# Patient Record
Sex: Male | Born: 1965 | Race: White | Hispanic: No | Marital: Married | Smoking: Never smoker
Health system: Southern US, Community
[De-identification: ages and names within clinical notes are randomized; demographics above are authoritative.]

## PROBLEM LIST (undated history)

## (undated) DIAGNOSIS — I2510 Atherosclerotic heart disease of native coronary artery without angina pectoris: Secondary | ICD-10-CM

## (undated) DIAGNOSIS — I4891 Unspecified atrial fibrillation: Secondary | ICD-10-CM

## (undated) DIAGNOSIS — I5021 Acute systolic (congestive) heart failure: Secondary | ICD-10-CM

---

## 1999-01-29 ENCOUNTER — Emergency Department (HOSPITAL_COMMUNITY): Admission: EM | Admit: 1999-01-29 | Discharge: 1999-01-29 | Payer: Self-pay | Admitting: Internal Medicine

## 2000-11-02 ENCOUNTER — Emergency Department (HOSPITAL_COMMUNITY): Admission: EM | Admit: 2000-11-02 | Discharge: 2000-11-02 | Payer: Self-pay | Admitting: Emergency Medicine

## 2018-10-01 ENCOUNTER — Inpatient Hospital Stay (HOSPITAL_COMMUNITY)
Admission: EM | Admit: 2018-10-01 | Discharge: 2018-11-05 | DRG: 286 | Disposition: A | Discharge status: Rehabilitation Facility | Payer: Medicaid Other | Attending: Internal Medicine | Admitting: Internal Medicine

## 2018-10-01 ENCOUNTER — Other Ambulatory Visit: Payer: Self-pay

## 2018-10-01 ENCOUNTER — Inpatient Hospital Stay (HOSPITAL_COMMUNITY): Payer: Medicaid Other

## 2018-10-01 ENCOUNTER — Emergency Department (HOSPITAL_COMMUNITY): Payer: Medicaid Other

## 2018-10-01 ENCOUNTER — Encounter (HOSPITAL_COMMUNITY): Payer: Self-pay | Admitting: Emergency Medicine

## 2018-10-01 DIAGNOSIS — K589 Irritable bowel syndrome without diarrhea: Secondary | ICD-10-CM | POA: Diagnosis present

## 2018-10-01 DIAGNOSIS — Z978 Presence of other specified devices: Secondary | ICD-10-CM

## 2018-10-01 DIAGNOSIS — R570 Cardiogenic shock: Secondary | ICD-10-CM | POA: Diagnosis not present

## 2018-10-01 DIAGNOSIS — K920 Hematemesis: Secondary | ICD-10-CM | POA: Diagnosis not present

## 2018-10-01 DIAGNOSIS — K567 Ileus, unspecified: Secondary | ICD-10-CM | POA: Diagnosis not present

## 2018-10-01 DIAGNOSIS — E43 Unspecified severe protein-calorie malnutrition: Secondary | ICD-10-CM | POA: Diagnosis not present

## 2018-10-01 DIAGNOSIS — K7200 Acute and subacute hepatic failure without coma: Secondary | ICD-10-CM | POA: Diagnosis not present

## 2018-10-01 DIAGNOSIS — K560 Paralytic ileus: Secondary | ICD-10-CM | POA: Diagnosis not present

## 2018-10-01 DIAGNOSIS — F17220 Nicotine dependence, chewing tobacco, uncomplicated: Secondary | ICD-10-CM | POA: Diagnosis present

## 2018-10-01 DIAGNOSIS — I272 Pulmonary hypertension, unspecified: Secondary | ICD-10-CM | POA: Diagnosis present

## 2018-10-01 DIAGNOSIS — K2971 Gastritis, unspecified, with bleeding: Secondary | ICD-10-CM

## 2018-10-01 DIAGNOSIS — Z6837 Body mass index (BMI) 37.0-37.9, adult: Secondary | ICD-10-CM

## 2018-10-01 DIAGNOSIS — D509 Iron deficiency anemia, unspecified: Secondary | ICD-10-CM

## 2018-10-01 DIAGNOSIS — I472 Ventricular tachycardia: Secondary | ICD-10-CM | POA: Diagnosis not present

## 2018-10-01 DIAGNOSIS — R109 Unspecified abdominal pain: Secondary | ICD-10-CM | POA: Diagnosis not present

## 2018-10-01 DIAGNOSIS — I459 Conduction disorder, unspecified: Secondary | ICD-10-CM | POA: Diagnosis present

## 2018-10-01 DIAGNOSIS — K319 Disease of stomach and duodenum, unspecified: Secondary | ICD-10-CM | POA: Diagnosis present

## 2018-10-01 DIAGNOSIS — K2210 Ulcer of esophagus without bleeding: Secondary | ICD-10-CM | POA: Diagnosis not present

## 2018-10-01 DIAGNOSIS — I255 Ischemic cardiomyopathy: Secondary | ICD-10-CM | POA: Diagnosis present

## 2018-10-01 DIAGNOSIS — K761 Chronic passive congestion of liver: Secondary | ICD-10-CM | POA: Diagnosis present

## 2018-10-01 DIAGNOSIS — J189 Pneumonia, unspecified organism: Secondary | ICD-10-CM

## 2018-10-01 DIAGNOSIS — D62 Acute posthemorrhagic anemia: Secondary | ICD-10-CM

## 2018-10-01 DIAGNOSIS — R7989 Other specified abnormal findings of blood chemistry: Secondary | ICD-10-CM

## 2018-10-01 DIAGNOSIS — Y838 Other surgical procedures as the cause of abnormal reaction of the patient, or of later complication, without mention of misadventure at the time of the procedure: Secondary | ICD-10-CM | POA: Diagnosis not present

## 2018-10-01 DIAGNOSIS — R918 Other nonspecific abnormal finding of lung field: Secondary | ICD-10-CM | POA: Diagnosis not present

## 2018-10-01 DIAGNOSIS — K208 Other esophagitis without bleeding: Secondary | ICD-10-CM

## 2018-10-01 DIAGNOSIS — R933 Abnormal findings on diagnostic imaging of other parts of digestive tract: Secondary | ICD-10-CM | POA: Diagnosis not present

## 2018-10-01 DIAGNOSIS — J90 Pleural effusion, not elsewhere classified: Secondary | ICD-10-CM

## 2018-10-01 DIAGNOSIS — R1909 Other intra-abdominal and pelvic swelling, mass and lump: Secondary | ICD-10-CM | POA: Diagnosis not present

## 2018-10-01 DIAGNOSIS — R945 Abnormal results of liver function studies: Secondary | ICD-10-CM

## 2018-10-01 DIAGNOSIS — Z931 Gastrostomy status: Secondary | ICD-10-CM | POA: Diagnosis not present

## 2018-10-01 DIAGNOSIS — R0602 Shortness of breath: Secondary | ICD-10-CM | POA: Diagnosis present

## 2018-10-01 DIAGNOSIS — T502X5A Adverse effect of carbonic-anhydrase inhibitors, benzothiadiazides and other diuretics, initial encounter: Secondary | ICD-10-CM | POA: Diagnosis not present

## 2018-10-01 DIAGNOSIS — I2510 Atherosclerotic heart disease of native coronary artery without angina pectoris: Secondary | ICD-10-CM | POA: Diagnosis present

## 2018-10-01 DIAGNOSIS — E876 Hypokalemia: Secondary | ICD-10-CM

## 2018-10-01 DIAGNOSIS — I5043 Acute on chronic combined systolic (congestive) and diastolic (congestive) heart failure: Principal | ICD-10-CM | POA: Diagnosis present

## 2018-10-01 DIAGNOSIS — R5381 Other malaise: Secondary | ICD-10-CM | POA: Diagnosis not present

## 2018-10-01 DIAGNOSIS — Z09 Encounter for follow-up examination after completed treatment for conditions other than malignant neoplasm: Secondary | ICD-10-CM

## 2018-10-01 DIAGNOSIS — R601 Generalized edema: Secondary | ICD-10-CM | POA: Diagnosis present

## 2018-10-01 DIAGNOSIS — S7011XA Contusion of right thigh, initial encounter: Secondary | ICD-10-CM | POA: Diagnosis not present

## 2018-10-01 DIAGNOSIS — Z23 Encounter for immunization: Secondary | ICD-10-CM

## 2018-10-01 DIAGNOSIS — Z833 Family history of diabetes mellitus: Secondary | ICD-10-CM

## 2018-10-01 DIAGNOSIS — R0682 Tachypnea, not elsewhere classified: Secondary | ICD-10-CM

## 2018-10-01 DIAGNOSIS — I97630 Postprocedural hematoma of a circulatory system organ or structure following a cardiac catheterization: Secondary | ICD-10-CM | POA: Diagnosis not present

## 2018-10-01 DIAGNOSIS — R140 Abdominal distension (gaseous): Secondary | ICD-10-CM | POA: Diagnosis not present

## 2018-10-01 DIAGNOSIS — K8020 Calculus of gallbladder without cholecystitis without obstruction: Secondary | ICD-10-CM | POA: Diagnosis not present

## 2018-10-01 DIAGNOSIS — I471 Supraventricular tachycardia: Secondary | ICD-10-CM | POA: Diagnosis not present

## 2018-10-01 DIAGNOSIS — K3189 Other diseases of stomach and duodenum: Secondary | ICD-10-CM | POA: Diagnosis not present

## 2018-10-01 DIAGNOSIS — I080 Rheumatic disorders of both mitral and aortic valves: Secondary | ICD-10-CM | POA: Diagnosis present

## 2018-10-01 DIAGNOSIS — Z811 Family history of alcohol abuse and dependence: Secondary | ICD-10-CM

## 2018-10-01 DIAGNOSIS — N170 Acute kidney failure with tubular necrosis: Secondary | ICD-10-CM | POA: Diagnosis not present

## 2018-10-01 DIAGNOSIS — J9601 Acute respiratory failure with hypoxia: Secondary | ICD-10-CM | POA: Diagnosis not present

## 2018-10-01 DIAGNOSIS — I5021 Acute systolic (congestive) heart failure: Secondary | ICD-10-CM

## 2018-10-01 DIAGNOSIS — K209 Esophagitis, unspecified: Secondary | ICD-10-CM | POA: Diagnosis not present

## 2018-10-01 DIAGNOSIS — R1110 Vomiting, unspecified: Secondary | ICD-10-CM | POA: Diagnosis not present

## 2018-10-01 DIAGNOSIS — K599 Functional intestinal disorder, unspecified: Secondary | ICD-10-CM | POA: Diagnosis not present

## 2018-10-01 DIAGNOSIS — I951 Orthostatic hypotension: Secondary | ICD-10-CM

## 2018-10-01 DIAGNOSIS — Z4659 Encounter for fitting and adjustment of other gastrointestinal appliance and device: Secondary | ICD-10-CM

## 2018-10-01 DIAGNOSIS — L8990 Pressure ulcer of unspecified site, unspecified stage: Secondary | ICD-10-CM

## 2018-10-01 DIAGNOSIS — L89892 Pressure ulcer of other site, stage 2: Secondary | ICD-10-CM | POA: Diagnosis not present

## 2018-10-01 DIAGNOSIS — Z813 Family history of other psychoactive substance abuse and dependence: Secondary | ICD-10-CM

## 2018-10-01 DIAGNOSIS — N179 Acute kidney failure, unspecified: Secondary | ICD-10-CM

## 2018-10-01 DIAGNOSIS — I4891 Unspecified atrial fibrillation: Secondary | ICD-10-CM

## 2018-10-01 DIAGNOSIS — R000 Tachycardia, unspecified: Secondary | ICD-10-CM

## 2018-10-01 DIAGNOSIS — K56609 Unspecified intestinal obstruction, unspecified as to partial versus complete obstruction: Secondary | ICD-10-CM

## 2018-10-01 DIAGNOSIS — Z720 Tobacco use: Secondary | ICD-10-CM

## 2018-10-01 HISTORY — DX: Morbid (severe) obesity due to excess calories: E66.01

## 2018-10-01 LAB — CBC WITH DIFFERENTIAL/PLATELET
Abs Immature Granulocytes: 0.17 10*3/uL — ABNORMAL HIGH (ref 0.00–0.07)
Basophils Absolute: 0 10*3/uL (ref 0.0–0.1)
Basophils Relative: 0 %
Eosinophils Absolute: 0 10*3/uL (ref 0.0–0.5)
Eosinophils Relative: 0 %
HCT: 49.3 % (ref 39.0–52.0)
Hemoglobin: 15.1 g/dL (ref 13.0–17.0)
IMMATURE GRANULOCYTES: 1 %
Lymphocytes Relative: 7 %
Lymphs Abs: 1 10*3/uL (ref 0.7–4.0)
MCH: 29.2 pg (ref 26.0–34.0)
MCHC: 30.6 g/dL (ref 30.0–36.0)
MCV: 95.4 fL (ref 80.0–100.0)
MONOS PCT: 7 %
Monocytes Absolute: 0.9 10*3/uL (ref 0.1–1.0)
Neutro Abs: 11.4 10*3/uL — ABNORMAL HIGH (ref 1.7–7.7)
Neutrophils Relative %: 85 %
Platelets: 203 10*3/uL (ref 150–400)
RBC: 5.17 MIL/uL (ref 4.22–5.81)
RDW: 17.4 % — ABNORMAL HIGH (ref 11.5–15.5)
WBC: 13.5 10*3/uL — ABNORMAL HIGH (ref 4.0–10.5)
nRBC: 0.2 % (ref 0.0–0.2)

## 2018-10-01 LAB — URINALYSIS, ROUTINE W REFLEX MICROSCOPIC
Bilirubin Urine: NEGATIVE
Glucose, UA: NEGATIVE mg/dL
Ketones, ur: NEGATIVE mg/dL
Leukocytes,Ua: NEGATIVE
NITRITE: NEGATIVE
Protein, ur: NEGATIVE mg/dL
SPECIFIC GRAVITY, URINE: 1.023 (ref 1.005–1.030)
pH: 5 (ref 5.0–8.0)

## 2018-10-01 LAB — COMPREHENSIVE METABOLIC PANEL WITH GFR
ALT: 268 U/L — ABNORMAL HIGH (ref 0–44)
AST: 110 U/L — ABNORMAL HIGH (ref 15–41)
Albumin: 3.3 g/dL — ABNORMAL LOW (ref 3.5–5.0)
Alkaline Phosphatase: 153 U/L — ABNORMAL HIGH (ref 38–126)
Anion gap: 12 (ref 5–15)
BUN: 53 mg/dL — ABNORMAL HIGH (ref 6–20)
CO2: 19 mmol/L — ABNORMAL LOW (ref 22–32)
Calcium: 8.6 mg/dL — ABNORMAL LOW (ref 8.9–10.3)
Chloride: 105 mmol/L (ref 98–111)
Creatinine, Ser: 1.44 mg/dL — ABNORMAL HIGH (ref 0.61–1.24)
GFR calc Af Amer: >60 mL/min
GFR calc non Af Amer: 55 mL/min — ABNORMAL LOW
Glucose, Bld: 136 mg/dL — ABNORMAL HIGH (ref 70–99)
Potassium: 4.7 mmol/L (ref 3.5–5.1)
Sodium: 136 mmol/L (ref 135–145)
Total Bilirubin: 6.2 mg/dL — ABNORMAL HIGH (ref 0.3–1.2)
Total Protein: 6.4 g/dL — ABNORMAL LOW (ref 6.5–8.1)

## 2018-10-01 LAB — BRAIN NATRIURETIC PEPTIDE
B NATRIURETIC PEPTIDE 5: 2333.1 pg/mL — AB (ref 0.0–100.0)
B Natriuretic Peptide: 2769.2 pg/mL — ABNORMAL HIGH (ref 0.0–100.0)

## 2018-10-01 LAB — PROTIME-INR
INR: 1.4 — ABNORMAL HIGH (ref 0.8–1.2)
Prothrombin Time: 17.2 s — ABNORMAL HIGH (ref 11.4–15.2)

## 2018-10-01 LAB — TSH: TSH: 4.716 u[IU]/mL — ABNORMAL HIGH (ref 0.350–4.500)

## 2018-10-01 LAB — AMMONIA: Ammonia: 15 umol/L (ref 9–35)

## 2018-10-01 LAB — LIPASE, BLOOD: Lipase: 46 U/L (ref 11–51)

## 2018-10-01 MED ORDER — SODIUM CHLORIDE 0.9 % IV SOLN
1.0000 g | Freq: Once | INTRAVENOUS | Status: AC
  Administered 2018-10-01: 1 g via INTRAVENOUS
  Filled 2018-10-01: qty 10

## 2018-10-01 MED ORDER — TRAMADOL HCL 50 MG PO TABS
25.0000 mg | ORAL_TABLET | Freq: Four times a day (QID) | ORAL | Status: DC | PRN
  Administered 2018-10-01 – 2018-10-14 (×2): 25 mg via ORAL
  Filled 2018-10-01 (×2): qty 1

## 2018-10-01 MED ORDER — AZITHROMYCIN 250 MG PO TABS
500.0000 mg | ORAL_TABLET | Freq: Once | ORAL | Status: AC
  Administered 2018-10-01: 500 mg via ORAL
  Filled 2018-10-01: qty 2

## 2018-10-01 MED ORDER — FUROSEMIDE 10 MG/ML IJ SOLN
40.0000 mg | Freq: Once | INTRAMUSCULAR | Status: AC
  Administered 2018-10-01: 40 mg via INTRAVENOUS
  Filled 2018-10-01: qty 4

## 2018-10-01 MED ORDER — ORAL CARE MOUTH RINSE
15.0000 mL | Freq: Two times a day (BID) | OROMUCOSAL | Status: DC
  Administered 2018-10-01 – 2018-11-05 (×46): 15 mL via OROMUCOSAL

## 2018-10-01 NOTE — Progress Notes (Signed)
Called to room for MEWS score. Patient A&Ox4, +4 BLE pitting edema. See VS flowsheet. Patient c/o aching pain in stomach. RN paging MD to notify.

## 2018-10-01 NOTE — ED Notes (Signed)
Attempted to straight stick pt in right hand. Unsuccessful on straight stick. RN made aware

## 2018-10-01 NOTE — ED Provider Notes (Addendum)
[facility] Note   CSN: [id number] Arrival date & time: 10/01/18  1157    History   Chief Complaint Chief Complaint  Patient presents with  . Leg Swelling    HPI Aaron [name] is a 53 y.o. male presents the emergency department with chief complaint of swelling and shortness of breath.  History is gathered from the patient and his wife and son who are at bedside.  There is a level 5 caveat due to mild confusion and altered mentation.  He has no known contributing past medical history.  Patient states that 1 month ago he developed some abdominal discomfort, bloating which he attributed to "IBS."  His wife brought him an over counter herbal supplement called IB guard.  He took it for 1 week and was feeling improved.  3 weeks ago he developed the same symptoms with bloating, abdominal discomfort.  Since that time he has had significantly worsening abdominal swelling and distention along with dark urine, swelling in his lower extremities and now shortness of breath.  Over the past week his legs have become so swollen he is unable to ambulate.  He has been extremely short of breath, unable to lie flat and his wife noticed that he has been intermittently confused.  The patient denies a history of alcohol abuse.  He does not use Tylenol.  He has no history of IV drug use.  He denies any other recent illnesses.     HPI  History reviewed. No pertinent past medical history.  There are no active problems to display for this patient.   History reviewed. No pertinent surgical history.      Home Medications    Prior to Admission medications   Not on File    Family History No family history on file.  Social History Social History   Tobacco Use  . Smoking status: Not on file  Substance Use Topics  . Alcohol use: Not on file  . Drug use: Not on file     Allergies   Patient has no known allergies.   Review of Systems Review of Systems    Unable to perform ROS: Mental status change     Physical Exam Updated Vital Signs BP (!) 121/96   Pulse 99   Temp 98.9 F (37.2 C)   Resp 18   SpO2 97%   Physical Exam Vitals signs and nursing note reviewed.  Constitutional:      General: He is not in acute distress.    Appearance: He is well-developed. He is not diaphoretic.  HENT:     Head: Normocephalic and atraumatic.  Eyes:     General: Scleral icterus present.     Conjunctiva/sclera: Conjunctivae normal.  Neck:     Musculoskeletal: Normal range of motion and neck supple.  Cardiovascular:     Rate and Rhythm: Normal rate and regular rhythm.     Heart sounds: Normal heart sounds.     Comments: Anasarca with massive swelling and pitting edema of the bilateral lower extremities.  It extends up to the diaphragmatic line. Pulmonary:     Effort: Tachypnea and respiratory distress present.     Breath sounds: Normal breath sounds. No decreased breath sounds, wheezing, rhonchi or rales.  Abdominal:     General: There is distension.     Palpations: Abdomen is soft.     Tenderness: There is abdominal tenderness.     Comments: Abdominal distention with right upper quadrant tenderness and fullness  Musculoskeletal:        General: Swelling present.     Right lower leg: Edema present.     Left lower leg: Edema present.  Skin:    General: Skin is warm and dry.     Coloration: Skin is jaundiced.  Neurological:     Mental Status: He is alert.  Psychiatric:        Behavior: Behavior normal.      ED Treatments / Results  Labs (all labs ordered are listed, but only abnormal results are displayed) Labs Reviewed - No data to display  EKG None  Radiology No results found.  Procedures .Critical Care Performed by: Arthor Captain, PA-C Authorized by: Arthor Captain, PA-C   Critical care provider statement:    Critical care time (minutes):  50   Critical care was necessary to treat or prevent imminent or  life-threatening deterioration of the following conditions:  Hepatic failure and renal failure   Critical care was time spent personally by me on the following activities:  Discussions with consultants, evaluation of patient's response to treatment, examination of patient, ordering and performing treatments and interventions, ordering and review of laboratory studies, ordering and review of radiographic studies, pulse oximetry, re-evaluation of patient's condition, obtaining history from patient or surrogate and review of old charts   (including critical care time)  Medications Ordered in ED Medications - No data to display   Initial Impression / Assessment and Plan / ED Course  I have reviewed the triage vital signs and the nursing notes.  Pertinent labs & imaging results that were available during my care of the patient were reviewed by me and considered in my medical decision making (see chart for details).  Clinical Course as of Sep 30 1609  Thu Oct 01, 2018  1413 BUN(!): 53 [AH]  1517 Creatinine(!): 1.44 [AH]  1517 Total Protein(!): 6.4 [AH]  1517 Albumin(!): 3.3 [AH]  1518 AST(!): 110 [AH]  1518 ALT(!): 268 [AH]  1518 Alkaline Phosphatase(!): 153 [AH]  1518 Total Bilirubin(!): 6.2 [AH]    Clinical Course User Index [AH] Arthor Captain, PA-C       53 year old male presents with anasarca, dyspnea, jaundice.  Differential diagnosis includes acute hepatitis, acute hepatic failure, fatty liver disease, cirrhosis, choledocholithiasis, autoimmune hepatitis, hemolytic anemia.  His labs show acute kidney injury with elevated BUN level, elevated liver enzymes, elevated total bilirubin, elevated PT/INR.  Chest x-ray concerning for pneumonia.  He has been tachypneic.  I placed the patient on azithromycin and Rocephin.  Patient urine color is amber likely urobilinogen.  BNP elevated to almost 3000.  No baseline previously.  Patient will be admitted by Dr. Wyonia Hough to the hospitalist service.   Patient remained stable throughout his ED course.  Final Clinical Impressions(s) / ED Diagnoses   Final diagnoses:  Acute liver failure without hepatic coma  Anasarca  AKI (acute kidney injury) (HCC)  Shortness of breath  Pleural effusion  Community acquired pneumonia, unspecified laterality    ED Discharge Orders    None       Arthor Captain, PA-C 10/01/18 1637    Arthor Captain, PA-C 10/01/18 1640    Raeford Razor, MD 10/02/18 [phone]

## 2018-10-01 NOTE — Progress Notes (Signed)
   10/01/18 1654  MEWS Score  Pulse Rate (!) 102  BP (!) 138/106  Temp (!) 97.5 F (36.4 C)  SpO2 96 %  MEWS Score  MEWS RR 3  MEWS Pulse 1  MEWS Systolic 0  MEWS LOC 0  MEWS Temp 0  MEWS Score 4  MEWS Score Color Red  RRT nurse and charge nurse made aware. MD at bedside.

## 2018-10-01 NOTE — ED Triage Notes (Signed)
Patient c/o bilateral leg swelling worsening x3 weeks. Also c/o left side pain. Patient appears jaundiced. Reports weakness and SOB on exertion.

## 2018-10-01 NOTE — ED Notes (Signed)
ED TO INPATIENT HANDOFF REPORT  ED Nurse Name and Phone #: Leeroy Bock [phone]  S Name/Age/Gender [name] 53 y.o. male Room/Bed: WA21/WA21  Code Status   Code Status: Not on file  Home/SNF/Other Home Patient oriented to: self, place, time and situation Is this baseline? Yes   Triage Complete: Triage complete  Chief Complaint Leg Swelling  Triage Note Patient c/o bilateral leg swelling worsening x3 weeks. Also c/o left side pain. Patient appears jaundiced. Reports weakness and SOB on exertion.   Allergies No Known Allergies  Level of Care/Admitting Diagnosis ED Disposition    ED Disposition Condition Comment   Admit  Hospital Area: [facility] [[id number]]  Level of Care: Telemetry [5]  Admit to tele based on following criteria: Monitor for Ischemic changes  Admit to tele based on following criteria: Acute CHF  Diagnosis: Anasarca [981191]  Admitting Physician: Leatha Gilding [[id number]]  Attending Physician: Leatha Gilding [phone]  Estimated length of stay: past midnight tomorrow  Certification:: I certify this patient will need inpatient services for at least 2 midnights  PT Class (Do Not Modify): Inpatient [101]  PT Acc Code (Do Not Modify): Private [1]       B Medical/Surgery History History reviewed. No pertinent past medical history. History reviewed. No pertinent surgical history.   A IV Location/Drains/Wounds Patient Lines/Drains/Airways Status   Active Line/Drains/Airways    Name:   Placement date:   Placement time:   Site:   Days:   Peripheral IV 10/01/18 Left Forearm   10/01/18    1319    Forearm   less than 1   Peripheral IV 10/01/18 Left Antecubital   10/01/18    1356    Antecubital   less than 1          Intake/Output Last 24 hours No intake or output data in the 24 hours ending 10/01/18 1627  Labs/Imaging Results for orders placed or performed during the hospital encounter of 10/01/18 (from the past 48 hour(s))   Comprehensive metabolic panel     Status: Abnormal   Collection Time: 10/01/18  1:36 PM  Result Value Ref Range   Sodium 136 135 - 145 mmol/L   Potassium 4.7 3.5 - 5.1 mmol/L   Chloride 105 98 - 111 mmol/L   CO2 19 (L) 22 - 32 mmol/L   Glucose, Bld 136 (H) 70 - 99 mg/dL   BUN 53 (H) 6 - 20 mg/dL   Creatinine, Ser 9.56 (H) 0.61 - 1.24 mg/dL   Calcium 8.6 (L) 8.9 - 10.3 mg/dL   Total Protein 6.4 (L) 6.5 - 8.1 g/dL   Albumin 3.3 (L) 3.5 - 5.0 g/dL   AST 213 (H) 15 - 41 U/L   ALT 268 (H) 0 - 44 U/L   Alkaline Phosphatase 153 (H) 38 - 126 U/L   Total Bilirubin 6.2 (H) 0.3 - 1.2 mg/dL   GFR calc non Af Amer 55 (L) >60 mL/min   GFR calc Af Amer >60 >60 mL/min   Anion gap 12 5 - 15    Comment: Performed at [facility], [address] [place], [place] [address]  CBC with Differential     Status: Abnormal   Collection Time: 10/01/18  1:36 PM  Result Value Ref Range   WBC 13.5 (H) 4.0 - 10.5 K/uL   RBC 5.17 4.22 - 5.81 MIL/uL   Hemoglobin 15.1 13.0 - 17.0 g/dL   HCT 84.6 96.2 - 95.2 %   MCV  95.4 80.0 - 100.0 fL   MCH 29.2 26.0 - 34.0 pg   MCHC 30.6 30.0 - 36.0 g/dL   RDW 96.7 (H) 59.1 - 63.8 %   Platelets 203 150 - 400 K/uL   nRBC 0.2 0.0 - 0.2 %   Neutrophils Relative % 85 %   Neutro Abs 11.4 (H) 1.7 - 7.7 K/uL   Lymphocytes Relative 7 %   Lymphs Abs 1.0 0.7 - 4.0 K/uL   Monocytes Relative 7 %   Monocytes Absolute 0.9 0.1 - 1.0 K/uL   Eosinophils Relative 0 %   Eosinophils Absolute 0.0 0.0 - 0.5 K/uL   Basophils Relative 0 %   Basophils Absolute 0.0 0.0 - 0.1 K/uL   Immature Granulocytes 1 %   Abs Immature Granulocytes 0.17 (H) 0.00 - 0.07 K/uL    Comment: Performed at [facility], [address], [place], [place] [address]  Lipase, blood     Status: None   Collection Time: 10/01/18  1:36 PM  Result Value Ref Range   Lipase 46 11 - 51 U/L    Comment: Performed at [facility], [address] [place], [place]  [address]  Brain natriuretic peptide     Status: Abnormal   Collection Time: 10/01/18  1:37 PM  Result Value Ref Range   B Natriuretic Peptide 2,769.2 (H) 0.0 - 100.0 pg/mL    Comment: Performed at [facility], [address] [place], [place] [address]  Ammonia     Status: None   Collection Time: 10/01/18  1:37 PM  Result Value Ref Range   Ammonia 15 9 - 35 umol/L    Comment: Performed at [facility], [address], [place], [place] [address]  Protime-INR     Status: Abnormal   Collection Time: 10/01/18  1:53 PM  Result Value Ref Range   Prothrombin Time 17.2 (H) 11.4 - 15.2 seconds   INR 1.4 (H) 0.8 - 1.2    Comment: (NOTE) INR goal varies based on device and disease states. Performed at [facility], [address], [place], [place] [address]   Urinalysis, Routine w reflex microscopic     Status: Abnormal   Collection Time: 10/01/18  3:23 PM  Result Value Ref Range   Color, Urine AMBER (A) YELLOW    Comment: BIOCHEMICALS MAY BE AFFECTED BY COLOR   APPearance HAZY (A) CLEAR   Specific Gravity, Urine 1.023 1.005 - 1.030   pH 5.0 5.0 - 8.0   Glucose, UA NEGATIVE NEGATIVE mg/dL   Hgb urine dipstick SMALL (A) NEGATIVE   Bilirubin Urine NEGATIVE NEGATIVE   Ketones, ur NEGATIVE NEGATIVE mg/dL   Protein, ur NEGATIVE NEGATIVE mg/dL   Nitrite NEGATIVE NEGATIVE   Leukocytes,Ua NEGATIVE NEGATIVE   RBC / HPF 0-5 0 - 5 RBC/hpf   WBC, UA 0-5 0 - 5 WBC/hpf   Bacteria, UA RARE (A) NONE SEEN   Squamous Epithelial / LPF 0-5 0 - 5   Mucus PRESENT    Hyaline Casts, UA PRESENT     Comment: Performed at [facility], [address] [place], [place] [address]   Ct Abdomen Pelvis Wo Contrast  Result Date: 10/01/2018 CLINICAL DATA:  Side abdominal pain, BILATERAL leg swelling for 3 weeks, question jaundice, weakness, shortness of breath with exertion EXAM: CT ABDOMEN AND PELVIS WITHOUT CONTRAST TECHNIQUE: Multidetector  CT imaging of the abdomen and pelvis was performed following the standard protocol without IV  contrast. Sagittal and coronal MPR images reconstructed from axial data set. Oral contrast was not administered. COMPARISON:  None FINDINGS: Lower chest: Small BILATERAL pleural effusions. Subsegmental atelectasis LEFT lower lobe. Airspace infiltrates RIGHT lower lobe question pneumonia. Hepatobiliary: Gallbladder and liver normal appearance. No definite hepatic mass or nodularity Pancreas: Normal appearance Spleen: Normal appearance Adrenals/Urinary Tract: Adrenal glands normal appearance. LEFT kidney normal appearance without mass or hydronephrosis. Low-attenuation focus mid RIGHT kidney question small cyst approximately 16 x 15 mm diameter image 45. Additional large exophytic cyst at inferior pole RIGHT kidney 9.1 x 9.4 x 7.4 cm. Bladder grossly unremarkable. Stomach/Bowel: Elongated sigmoid loop. Stomach and bowel loops otherwise normal appearance. Appendix not visualized. Vascular/Lymphatic: Atherosclerotic calcifications aorta and iliac arteries without aneurysm. Enlargement of cardiac chambers. No adenopathy. Reproductive: Mild prostatic enlargement gland 5.3 x 4.7 cm image 90. Other: Scattered subcutaneous and soft tissue edema throughout abdomen and pelvis into of inguinal regions. Mild presacral edema. Scattered fluid along the inferior aspects of the pararenal spaces in the upper pelvis. Minimal intraperitoneal fluid free fluid. No free air. No hernia. Musculoskeletal: Bones demineralized with scattered degenerative disc disease changes. IMPRESSION: Scattered soft tissue edema, minimal ascites and small BILATERAL pleural effusions, could be related to hypoproteinemia, heart failure, renal failure or anasarca. RIGHT lower lobe infiltrate consistent with pneumonia. Enlargement of cardiac chambers. Prostatic enlargement. Probable RIGHT renal cysts, small at mid kidney and 9.4 cm at inferior pole; these could be  characterized by ultrasound. Electronically Signed   By: Ulyses Southward M.D.   On: 10/01/2018 14:54   Dg Chest Port 1 View  Result Date: 10/01/2018 CLINICAL DATA:  Patient c/o bilateral leg swelling worsening x3 weeks. Also c/o left side pain. Patient appears jaundiced. Reports weakness and SOB on exertion. Non smoker, tachypnea. EXAM: PORTABLE CHEST 1 VIEW COMPARISON:  None. FINDINGS: Cardiac silhouette is mildly enlarged. No mediastinal or hilar masses or convincing adenopathy. There is left greater than right lung base opacity consistent with atelectasis or infection. Remainder of the lungs is clear with no evidence of pulmonary edema. No visualized pleural effusion. No pneumothorax. Skeletal structures are grossly intact. IMPRESSION: 1. Left greater than right lung base opacity consistent with atelectasis, pneumonia or a combination. 2. No evidence of pulmonary edema. 3. Mild cardiomegaly. Electronically Signed   By: Amie Portland M.D.   On: 10/01/2018 13:41    Pending Labs Unresulted Labs (From admission, onward)    Start     Ordered   10/01/18 1248  Hepatitis panel, acute  ONCE - STAT,   STAT     10/01/18 1247   Signed and Held  HIV antibody (Routine Testing)  Once,   R     Signed and Held   Signed and Held  Brain natriuretic peptide  Once,   R     Signed and Held   Signed and Held  TSH  Once,   R     Signed and Held   Signed and Held  Comprehensive metabolic panel  Tomorrow morning,   R     Signed and Held   Medical illustrator and Held  CBC  Tomorrow morning,   R     Signed and Held   Signed and Held  Protime-INR  Tomorrow morning,   R     Signed and Held          Vitals/Pain Today's Vitals   10/01/18 1500 10/01/18 1515 10/01/18 1530 10/01/18 1612  BP: 112/90 112/90 (!) 118/94 (!) 122/96  Pulse: 99 (!)  103 94 95  Resp: (!) 33 19 (!) 29 (!) 36  Temp:      SpO2: 96% 94% 100% 91%    Isolation Precautions No active isolations  Medications Medications  furosemide (LASIX) injection 40 mg  (has no administration in time range)  cefTRIAXone (ROCEPHIN) 1 g in sodium chloride 0.9 % 100 mL IVPB (1 g Intravenous New Bag/Given 10/01/18 1509)  azithromycin (ZITHROMAX) tablet 500 mg (500 mg Oral Given 10/01/18 1510)    Mobility walks with person assist High fall risk   Focused Assessments Pulmonary Assessment Handoff:  Lung sounds:            R Recommendations: See Admitting Provider Note  Report given to:   Additional Notes:

## 2018-10-01 NOTE — H&P (Signed)
History and Physical    Aaron [name] ACZ:[id number] DOB: [birth year] DOA: 10/01/2018  I have briefly reviewed the patient's prior medical records in [facility]  PCP: [facility] [place]  Patient coming from: home  Chief Complaint: progressive body swelling   HPI: Aaron [name] is a 53 y.o. 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  Patient is not sure how much weight he gained but he thinks a lot.  He also reports abdominal pain on and off for the past 3 to 4 weeks.  He initially developed bloating and intermittent diarrhea for which she was taking over-the-counter IBgard, and this gotten a little bit better but the fluid buildup persisted and got to the point that he is barely able to ambulate right now.  He denies any chest pain.  He complains of shortness of breath with ambulation and also with laying flat.  He denies any nausea or vomiting.  He states that he urinates well, does not feel like there is any changes in the volume but has noticed that his urine has gotten a little bit darker in the past few days.  He has no history of tobacco use, no history of alcohol use or any other substances.  He denies any fevers or chills, denies any cough or chest congestion.  Wife also reports that patient has been having intermittent mild confusion at home.  ED Course: In the emergency room his vitals are stable, he is afebrile, tachypneic and borderline tachycardic.  Blood pressure is normal.  He is satting well on room air.  His blood work is pertinent for BUN of 53 and a creatinine of 1.4, his AST and ALT are elevated into 1-200s, his total bilirubin is 6.2.  His total protein and albumin are very mildly decreased at 6.4 and 3.3  respectively.  His ammonia is normal at 15.  He has a slight leukocytosis with a white count of 13.5.  His BNP is elevated at 2769.  His INR is slightly elevated at 1.4.  Chest x-ray with bilateral atelectasis?  Infiltrate, cardiomegaly no evidence of pulmonary edema.  CT abdomen and pelvis showed diffuse anasarca, minimal ascites and small bilateral pleural effusions.  Also did show right lower lobe infiltrate consistent with pneumonia.  Review of Systems: As per HPI otherwise 10 point review of systems negative.   History reviewed. No pertinent past medical history.  History reviewed. No pertinent surgical history.  No smoking, no alcohol use, no drugs  No Known Allergies  Family history negative for liver disease  Prior to Admission medications   Not on File    Physical Exam: Vitals:   10/01/18 1259 10/01/18 1349 10/01/18 1442 10/01/18 1515  BP: (!) 123/95 (!) 117/96 122/83 112/90  Pulse: (!) 102 91 95 (!) 103  Resp: 13 (!) 23 (!) 28 19  Temp:      SpO2: 97% 99% 96% 94%    Constitutional: NAD, calm, comfortable, visibly jaundiced Eyes: PERRL, scleral icterus present ENMT: Mucous membranes are dry Neck: normal, supple Respiratory: Decreased breath sounds at the bases, no wheezing or crackles heard.  Increased respiratory effort with tachypnea Cardiovascular: Regular rate and rhythm, no murmurs / rubs / gallops.  2+ pitting lower extremity edema as  well as body wall anasarca. 2+ pedal pulses.  Abdomen: Distended, no tenderness, no masses palpated. Bowel sounds positive.  Musculoskeletal: no clubbing / cyanosis. Skin: no rashes Neurologic: CN 2-12 grossly intact. Strength 5/5 in all 4.  Psychiatric: Normal judgment and insight. Alert and oriented x 3. Normal mood.   Labs on Admission: I have personally reviewed following labs and imaging studies  CBC: Recent Labs  Lab 10/01/18 1336  WBC 13.5*  NEUTROABS 11.4*  HGB 15.1  HCT 49.3  MCV 95.4  PLT 203   Basic  Metabolic Panel: Recent Labs  Lab 10/01/18 1336  NA 136  K 4.7  CL 105  CO2 19*  GLUCOSE 136*  BUN 53*  CREATININE 1.44*  CALCIUM 8.6*   GFR: CrCl cannot be calculated (Unknown ideal weight.). Liver Function Tests: Recent Labs  Lab 10/01/18 1336  AST 110*  ALT 268*  ALKPHOS 153*  BILITOT 6.2*  PROT 6.4*  ALBUMIN 3.3*   Recent Labs  Lab 10/01/18 1336  LIPASE 46   Recent Labs  Lab 10/01/18 1337  AMMONIA 15   Coagulation Profile: Recent Labs  Lab 10/01/18 1353  INR 1.4*   Cardiac Enzymes: No results for input(s): CKTOTAL, CKMB, CKMBINDEX, TROPONINI in the last 168 hours. BNP (last 3 results) No results for input(s): PROBNP in the last 8760 hours. HbA1C: No results for input(s): HGBA1C in the last 72 hours. CBG: No results for input(s): GLUCAP in the last 168 hours. Lipid Profile: No results for input(s): CHOL, HDL, LDLCALC, TRIG, CHOLHDL, LDLDIRECT in the last 72 hours. Thyroid Function Tests: No results for input(s): TSH, T4TOTAL, FREET4, T3FREE, THYROIDAB in the last 72 hours. Anemia Panel: No results for input(s): VITAMINB12, FOLATE, FERRITIN, TIBC, IRON, RETICCTPCT in the last 72 hours. Urine analysis: No results found for: COLORURINE, APPEARANCEUR, LABSPEC, PHURINE, GLUCOSEU, HGBUR, BILIRUBINUR, KETONESUR, PROTEINUR, UROBILINOGEN, NITRITE, LEUKOCYTESUR   Radiological Exams on Admission: Ct Abdomen Pelvis Wo Contrast  Result Date: 10/01/2018 CLINICAL DATA:  Side abdominal pain, BILATERAL leg swelling for 3 weeks, question jaundice, weakness, shortness of breath with exertion EXAM: CT ABDOMEN AND PELVIS WITHOUT CONTRAST TECHNIQUE: Multidetector CT imaging of the abdomen and pelvis was performed following the standard protocol without IV contrast. Sagittal and coronal MPR images reconstructed from axial data set. Oral contrast was not administered. COMPARISON:  None FINDINGS: Lower chest: Small BILATERAL pleural effusions. Subsegmental atelectasis LEFT  lower lobe. Airspace infiltrates RIGHT lower lobe question pneumonia. Hepatobiliary: Gallbladder and liver normal appearance. No definite hepatic mass or nodularity Pancreas: Normal appearance Spleen: Normal appearance Adrenals/Urinary Tract: Adrenal glands normal appearance. LEFT kidney normal appearance without mass or hydronephrosis. Low-attenuation focus mid RIGHT kidney question small cyst approximately 16 x 15 mm diameter image 45. Additional large exophytic cyst at inferior pole RIGHT kidney 9.1 x 9.4 x 7.4 cm. Bladder grossly unremarkable. Stomach/Bowel: Elongated sigmoid loop. Stomach and bowel loops otherwise normal appearance. Appendix not visualized. Vascular/Lymphatic: Atherosclerotic calcifications aorta and iliac arteries without aneurysm. Enlargement of cardiac chambers. No adenopathy. Reproductive: Mild prostatic enlargement gland 5.3 x 4.7 cm image 90. Other: Scattered subcutaneous and soft tissue edema throughout abdomen and pelvis into of inguinal regions. Mild presacral edema. Scattered fluid along the inferior aspects of the pararenal spaces in the upper pelvis. Minimal intraperitoneal fluid free fluid. No free air. No hernia. Musculoskeletal: Bones demineralized with scattered degenerative disc disease changes. IMPRESSION: Scattered soft tissue edema, minimal ascites and small BILATERAL pleural effusions, could be related to hypoproteinemia, heart failure, renal failure or anasarca. RIGHT  lower lobe infiltrate consistent with pneumonia. Enlargement of cardiac chambers. Prostatic enlargement. Probable RIGHT renal cysts, small at mid kidney and 9.4 cm at inferior pole; these could be characterized by ultrasound. Electronically Signed   By: Ulyses Southward M.D.   On: 10/01/2018 14:54   Dg Chest Port 1 View  Result Date: 10/01/2018 CLINICAL DATA:  Patient c/o bilateral leg swelling worsening x3 weeks. Also c/o left side pain. Patient appears jaundiced. Reports weakness and SOB on exertion. Non  smoker, tachypnea. EXAM: PORTABLE CHEST 1 VIEW COMPARISON:  None. FINDINGS: Cardiac silhouette is mildly enlarged. No mediastinal or hilar masses or convincing adenopathy. There is left greater than right lung base opacity consistent with atelectasis or infection. Remainder of the lungs is clear with no evidence of pulmonary edema. No visualized pleural effusion. No pneumothorax. Skeletal structures are grossly intact. IMPRESSION: 1. Left greater than right lung base opacity consistent with atelectasis, pneumonia or a combination. 2. No evidence of pulmonary edema. 3. Mild cardiomegaly. Electronically Signed   By: Amie Portland M.D.   On: 10/01/2018 13:41    EKG: Independently reviewed.  Sinus rhythm, does have a pattern of S1 - QIII - TIII and slight intraventricular conduction delay  Assessment/Plan Active Problems:   Anasarca   Elevated LFTs   AKI (acute kidney injury) (HCC)   Principal Problem Anasarca/fluid overload -Differential with liver disease versus renal versus cardiac.  Urinalysis pending at this time -His total protein and albumin are very mildly decreased, would not suggest hypoalbuminemia as the cause -Suspicion slightly higher for heart failure with congestive hepatopathy and possible cardiorenal syndrome -Obtain a 2D echo, give a trial of Lasix this afternoon and monitor.  EKG shows signs of right ventricular strain.  BNP is significantly elevated  Active Problems Acute kidney injury -Closely monitor renal function, getting Lasix, obtain 2D echo.  Elevated LFTs -Liver morphology looks unremarkable on the CT scan so less likely underlying cirrhosis.  Gallbladder appears essentially normal. -Possibly related to congestive hepatopathy from heart failure -Acute hepatitis ordered by EDP, will follow -Ammonia is normal but INR is slightly elevated.  Will repeat INR tomorrow morning  Leukocytosis/concern for pneumonia -Patient is completely asymptomatic, does not have any URI  type symptoms, he is afebrile and has no cough.  Monitor off antibiotics.   DVT prophylaxis: SCDs Code Status: Full code Family Communication: Wife and son present at bedside Disposition Plan: Admit to telemetry Consults called: None   Pamella Pert, MD, PhD [facility]  Contact via [email or web address]  [facility] Office Info P: [phone]  F: [phone]   10/01/2018, 3:45 PM

## 2018-10-01 NOTE — ED Notes (Signed)
Unsuccessful IV attempt x 2 by this RN.  Another staff RN to attempt.

## 2018-10-02 ENCOUNTER — Inpatient Hospital Stay (HOSPITAL_COMMUNITY): Payer: Medicaid Other

## 2018-10-02 DIAGNOSIS — I5021 Acute systolic (congestive) heart failure: Secondary | ICD-10-CM

## 2018-10-02 DIAGNOSIS — I340 Nonrheumatic mitral (valve) insufficiency: Secondary | ICD-10-CM

## 2018-10-02 DIAGNOSIS — J9601 Acute respiratory failure with hypoxia: Secondary | ICD-10-CM

## 2018-10-02 DIAGNOSIS — I351 Nonrheumatic aortic (valve) insufficiency: Secondary | ICD-10-CM

## 2018-10-02 LAB — HEPATITIS PANEL, ACUTE
HCV Ab: <0.1 s/co ratio (ref 0.0–0.9)
HEP B S AG: NEGATIVE
Hep A IgM: NEGATIVE
Hep B C IgM: NEGATIVE

## 2018-10-02 LAB — PROTIME-INR
INR: 1.4 — ABNORMAL HIGH (ref 0.8–1.2)
Prothrombin Time: 16.8 seconds — ABNORMAL HIGH (ref 11.4–15.2)

## 2018-10-02 LAB — COMPREHENSIVE METABOLIC PANEL
ALT: 219 U/L — ABNORMAL HIGH (ref 0–44)
AST: 81 U/L — ABNORMAL HIGH (ref 15–41)
Albumin: 3.2 g/dL — ABNORMAL LOW (ref 3.5–5.0)
Alkaline Phosphatase: 144 U/L — ABNORMAL HIGH (ref 38–126)
Anion gap: 11 (ref 5–15)
BUN: 52 mg/dL — ABNORMAL HIGH (ref 6–20)
CALCIUM: 8.4 mg/dL — AB (ref 8.9–10.3)
CO2: 21 mmol/L — ABNORMAL LOW (ref 22–32)
Chloride: 106 mmol/L (ref 98–111)
Creatinine, Ser: 1.24 mg/dL (ref 0.61–1.24)
GFR calc Af Amer: >60 mL/min (ref >60)
Glucose, Bld: 117 mg/dL — ABNORMAL HIGH (ref 70–99)
Potassium: 4.4 mmol/L (ref 3.5–5.1)
Sodium: 138 mmol/L (ref 135–145)
Total Bilirubin: 5.2 mg/dL — ABNORMAL HIGH (ref 0.3–1.2)
Total Protein: 6.4 g/dL — ABNORMAL LOW (ref 6.5–8.1)

## 2018-10-02 LAB — CBC
HCT: 52.2 % — ABNORMAL HIGH (ref 39.0–52.0)
Hemoglobin: 15.2 g/dL (ref 13.0–17.0)
MCH: 28.5 pg (ref 26.0–34.0)
MCHC: 29.1 g/dL — ABNORMAL LOW (ref 30.0–36.0)
MCV: 97.8 fL (ref 80.0–100.0)
Platelets: 170 10*3/uL (ref 150–400)
RBC: 5.34 MIL/uL (ref 4.22–5.81)
RDW: 17.6 % — ABNORMAL HIGH (ref 11.5–15.5)
WBC: 12.7 10*3/uL — ABNORMAL HIGH (ref 4.0–10.5)
nRBC: 0.2 % (ref 0.0–0.2)

## 2018-10-02 LAB — ECHOCARDIOGRAM COMPLETE
Height: 76 in
WEIGHTICAEL: 5340.42 [oz_av]

## 2018-10-02 LAB — HIV ANTIBODY (ROUTINE TESTING W REFLEX): HIV Screen 4th Generation wRfx: NONREACTIVE

## 2018-10-02 MED ORDER — CARVEDILOL 3.125 MG PO TABS: 3.1250 mg | ORAL_TABLET | Freq: Two times a day (BID) | ORAL | Status: DC

## 2018-10-02 MED ORDER — INFLUENZA VAC SPLIT QUAD 0.5 ML IM SUSY
0.5000 mL | PREFILLED_SYRINGE | INTRAMUSCULAR | Status: AC
  Administered 2018-10-03: 0.5 mL via INTRAMUSCULAR
  Filled 2018-10-02: qty 0.5

## 2018-10-02 MED ORDER — PNEUMOCOCCAL VAC POLYVALENT 25 MCG/0.5ML IJ INJ
0.5000 mL | INJECTION | INTRAMUSCULAR | Status: AC
  Administered 2018-10-03: 0.5 mL via INTRAMUSCULAR
  Filled 2018-10-02: qty 0.5

## 2018-10-02 MED ORDER — FUROSEMIDE 10 MG/ML IJ SOLN
40.0000 mg | Freq: Three times a day (TID) | INTRAMUSCULAR | Status: DC
  Administered 2018-10-02 – 2018-10-08 (×17): 40 mg via INTRAVENOUS
  Filled 2018-10-02 (×17): qty 4

## 2018-10-02 MED ORDER — PERFLUTREN LIPID MICROSPHERE
1.0000 mL | INTRAVENOUS | Status: AC | PRN
  Administered 2018-10-02: 4 mL via INTRAVENOUS
  Filled 2018-10-02: qty 10

## 2018-10-02 MED ORDER — FUROSEMIDE 10 MG/ML IJ SOLN
40.0000 mg | Freq: Two times a day (BID) | INTRAMUSCULAR | Status: DC
  Administered 2018-10-02: 40 mg via INTRAVENOUS
  Filled 2018-10-02: qty 4

## 2018-10-02 NOTE — Consult Note (Signed)
Cardiology Consultation:   [name] MRN: [id number]; DOB: Dec 04, 1965  Admit date: 10/01/2018 Date of Consult: 10/02/2018  Primary Care Provider: [facility] [place] Primary Cardiologist: Jodelle Red, MD -New to [facility] Primary Electrophysiologist:  None    Patient Profile:   Aaron [name] is a 53 y.o. male with a hx of obesity who is being seen today for the evaluation of CHF at the request of Dr. Elvera Lennox.  History of Present Illness:   Aaron [name] is followed at [facility] family medicine and has no reported medical history except for obesity and is on no medications.  He presented to the hospital on 10/01/2018 with complaints of progressive fluid retention, abdominal swelling and bloating as well as lower extremity edema over the last 3-4 weeks.  The patient did not know how much weight he had gained but he thought it was a lot.  His feet are incredibly swollen, but he states that he has been able to wear shoes, ambulate, etc. He was also having abdominal pain off and on for the last 3-4 weeks.  He had initially developed bloating and intermittent diarrhea for which he took over-the-counter IBgard.  These symptoms got a little better however his volume overload persisted.  At time of presentation he denied any chest pain.  He complained of shortness of breath with ambulation and also with laying flat.  On admission EKG showed Sinus rhythm, 95 bpm, with Left atrial enlargement, Nonspecific intraventricular conduction delay, TWI in lead II, III aVF and V5, V6 BNP was elevated at 2,769.  Serum creatinine was elevated at 1.44.  TSH was very mildly elevated at 4.716.  He had slight leukocytosis with WBCs of 13.5.  Hemoglobin was normal. LFTs were elevated with AST 110, ALT 268, total Bili 6.2 Chest x-ray showed left greater than right lung base opacity consistent with atelectasis, pneumonia or a combination.  There was no evidence of pulmonary edema.   There was mild cardiomegaly. Abdominal CT showed minimal ascites and small bilateral pleural effusions, possibly related to hypoproteinemia, heart failure, renal failure or anasarca.  There was right lower lobe infiltrate consistent with pneumonia.  There was enlargement of cardiac chambers.  Echocardiogram done yesterday showed severely dilated left ventricle with severe LV dysfunction, EF 15%.  There was diffuse hypokinesis.  Right ventricular systolic function is normal.  Left atrium was moderately dilated. There was moderate mitral regurgitation and mild aortic regurgitation.  Pt has been started on diuretic with lasix 40 mg IV BID, has receieved 2 doses. He has had 1.1L UOP so far.  He has carvedilol 3.125 mg BID ordered, to start this evening.   He denies any prior cardiac history. His father had a heart condition but was an alcoholic. He denies any alcohol, tobacco, or illicit substances. No recent viral syndromes. No exposures.   History reviewed. No pertinent past medical history.  History reviewed. No pertinent surgical history.   Home Medications:  Prior to Admission medications   Not on File    Inpatient Medications: Scheduled Meds: . furosemide  40 mg Intravenous Q12H  . mouth rinse  15 mL Mouth Rinse BID   Continuous Infusions:  PRN Meds: perflutren lipid microspheres (DEFINITY) IV suspension, traMADol  Allergies:   No Known Allergies  Social History:   Social History   Socioeconomic History  . Marital status: Married    Spouse name: Not on file  . Number of children: Not on file  . Years of education:  Not on file  . Highest education level: Not on file  Occupational History  . Not on file  Social Needs  . Financial resource strain: Not on file  . Food insecurity:    Worry: Not on file    Inability: Not on file  . Transportation needs:    Medical: Not on file    Non-medical: Not on file  Tobacco Use  . Smoking status: Never Smoker  . Smokeless tobacco:  Never Used  Substance and Sexual Activity  . Alcohol use: Never    Frequency: Never  . Drug use: Never  . Sexual activity: Not on file  Lifestyle  . Physical activity:    Days per week: Not on file    Minutes per session: Not on file  . Stress: Not on file  Relationships  . Social connections:    Talks on phone: Not on file    Gets together: Not on file    Attends religious service: Not on file    Active member of club or organization: Not on file    Attends meetings of clubs or organizations: Not on file    Relationship status: Not on file  . Intimate partner violence:    Fear of current or ex partner: Not on file    Emotionally abused: Not on file    Physically abused: Not on file    Forced sexual activity: Not on file  Other Topics Concern  . Not on file  Social History Narrative  . Not on file    Family History:   Father with unclear heart condition, but he was an alcoholic. No other family history of heart disease.  ROS:  Please see the history of present illness.  Review of Systems  Constitutional: Positive for malaise/fatigue. Negative for chills and fever.  HENT: Negative for ear pain and sinus pain.   Eyes: Negative for double vision and pain.  Respiratory: Positive for cough and shortness of breath.   Cardiovascular: Positive for orthopnea, leg swelling and PND. Negative for chest pain, palpitations and claudication.  Gastrointestinal: Positive for abdominal pain. Negative for blood in stool and melena.  Genitourinary: Negative for dysuria and hematuria.  Musculoskeletal: Negative for falls and myalgias.  Neurological: Negative for sensory change, speech change and loss of consciousness.   All other ROS reviewed and negative.     Physical Exam/Data:   Vitals:   10/01/18 1814 10/01/18 1947 10/02/18 0423 10/02/18 0704  BP: (!) 125/95 (!) 123/93 136/90   Pulse: 95 96 89   Resp: (!) Temp: 98.5 F (36.9 C) 98.4 F (36.9 C) (!) 97 F (36.1 C)     TempSrc:  Oral Axillary   SpO2: 99% 97% 99%   Weight:    (!) 151.4 kg  Height:        Intake/Output Summary (Last 24 hours) at 10/02/2018 1056 Last data filed at 10/02/2018 0600 Gross per 24 hour  Intake 320 ml  Output 1175 ml  Net -855 ml   Last 3 Weights 10/02/2018 10/01/2018  Weight (lbs) 333 lb 12.4 oz 333 lb 1.8 oz  Weight (kg) 151.4 kg 151.1 kg     Body mass index is 40.63 kg/m.  General:  Well nourished, well developed, in no acute distress. Nasal cannula in place HEENT: normal Lymph: no adenopathy Neck: JVD to mid neck at 60 degrees Endocrine:  No thryomegaly Vascular: No carotid bruits; RA pulses 2+ bilaterally. Nonpalpable distal pulses due to edema  but feet warm. Cardiac:  normal S1, S2; RRR; no murmur  Lungs:  No wheezing appreciated, diminished at bases Abd: soft, distended but nontender Ext: anasarca, with extreme LE edema in feet and continues to upper extremities. Musculoskeletal:  No deformities, BUE and BLE strength normal and equal Skin: warm and dry  Neuro:  CNs 2-12 intact, no focal abnormalities noted Psych:  Normal affect   EKG:  The EKG was personally reviewed and demonstrates: Sinus rhythm, 95 bpm, with Left atrial enlargement, Nonspecific intraventricular conduction delay, TWI in lead II, III aVF and V5, V6 Telemetry:  Telemetry was personally reviewed and demonstrates:  SR  Relevant CV Studies:  Echocardiogram 10/02/2018 IMPRESSIONS  1. The left ventricle has a visually estimated ejection fraction of. The cavity size was severely dilated. Left ventricular diastolic Doppler parameters are consistent with restrictive filling Left ventrical global hypokinesis without regional wall  motion abnormalities.  2. Diffuse hypokinesis EF 15% Given severithy of LV dysfunction consider CHF team consult.  3. The right ventricle has normal systolic function. The cavity was normal. There is no increase in right ventricular wall thickness.  4. Left atrial size was  moderately dilated.  5. The mitral valve is normal in structure. Mild thickening of the mitral valve leaflet. Mitral valve regurgitation is moderate by color flow Doppler.  6. The tricuspid valve is normal in structure.  7. The aortic valve is tricuspid Mild thickening of the aortic valve Aortic valve regurgitation is mild by color flow Doppler.  8. The pulmonic valve was grossly normal. Pulmonic valve regurgitation is mild by color flow Doppler.  FINDINGS  Left Ventricle: The left ventricle has a visually estimated ejection fraction of. The cavity size was severely dilated. There is no increase in left ventricular wall thickness. Left ventricular diastolic Doppler parameters are consistent with  restrictive filling Left ventrical global hypokinesis without regional wall motion abnormalities. Diffuse hypokinesis EF 15% Given severithy of LV dysfunction consider CHF team consult. Right Ventricle: The right ventricle has normal systolic function. The cavity was normal. There is no increase in right ventricular wall thickness. Left Atrium: left atrial size was moderately dilated Right Atrium: right atrial size was normal in size. Right atrial pressure is estimated at 15 mmHg. Interatrial Septum: No atrial level shunt detected by color flow Doppler. Pericardium: There is no evidence of pericardial effusion. Mitral Valve: The mitral valve is normal in structure. Mild thickening of the mitral valve leaflet. Mitral valve regurgitation is moderate by color flow Doppler. Tricuspid Valve: The tricuspid valve is normal in structure. Tricuspid valve regurgitation is mild by color flow Doppler. Aortic Valve: The aortic valve is tricuspid Mild thickening of the aortic valve. Aortic valve regurgitation is mild by color flow Doppler. Pulmonic Valve: The pulmonic valve was grossly normal. Pulmonic valve regurgitation is mild by color flow Doppler.    Laboratory Data:  Chemistry Recent Labs  Lab  10/01/18 1336 10/02/18 0527  NA 136 138  K 4.7 4.4  CL 105 106  CO2 19* 21*  GLUCOSE 136* 117*  BUN 53* 52*  CREATININE 1.44* 1.24  CALCIUM 8.6* 8.4*  GFRNONAA 55* >60  GFRAA >60 >60  ANIONGAP 12 11    Recent Labs  Lab 10/01/18 1336 10/02/18 0527  PROT 6.4* 6.4*  ALBUMIN 3.3* 3.2*  AST 110* 81*  ALT 268* 219*  ALKPHOS 153* 144*  BILITOT 6.2* 5.2*   Hematology Recent Labs  Lab 10/01/18 1336 10/02/18 0527  WBC 13.5* 12.7*  RBC 5.17 5.34  HGB 15.1 15.2  HCT 49.3 52.2*  MCV 95.4 97.8  MCH 29.2 28.5  MCHC 30.6 29.1*  RDW 17.4* 17.6*  PLT 203 170   Cardiac EnzymesNo results for input(s): TROPONINI in the last 168 hours. No results for input(s): TROPIPOC in the last 168 hours.  BNP Recent Labs  Lab 10/01/18 1337 10/01/18 1720  BNP 2,769.2* 2,333.1*    DDimer No results for input(s): DDIMER in the last 168 hours.  Radiology/Studies:  Ct Abdomen Pelvis Wo Contrast  Result Date: 10/01/2018 CLINICAL DATA:  Side abdominal pain, BILATERAL leg swelling for 3 weeks, question jaundice, weakness, shortness of breath with exertion EXAM: CT ABDOMEN AND PELVIS WITHOUT CONTRAST TECHNIQUE: Multidetector CT imaging of the abdomen and pelvis was performed following the standard protocol without IV contrast. Sagittal and coronal MPR images reconstructed from axial data set. Oral contrast was not administered. COMPARISON:  None FINDINGS: Lower chest: Small BILATERAL pleural effusions. Subsegmental atelectasis LEFT lower lobe. Airspace infiltrates RIGHT lower lobe question pneumonia. Hepatobiliary: Gallbladder and liver normal appearance. No definite hepatic mass or nodularity Pancreas: Normal appearance Spleen: Normal appearance Adrenals/Urinary Tract: Adrenal glands normal appearance. LEFT kidney normal appearance without mass or hydronephrosis. Low-attenuation focus mid RIGHT kidney question small cyst approximately 16 x 15 mm diameter image 45. Additional large exophytic cyst at  inferior pole RIGHT kidney 9.1 x 9.4 x 7.4 cm. Bladder grossly unremarkable. Stomach/Bowel: Elongated sigmoid loop. Stomach and bowel loops otherwise normal appearance. Appendix not visualized. Vascular/Lymphatic: Atherosclerotic calcifications aorta and iliac arteries without aneurysm. Enlargement of cardiac chambers. No adenopathy. Reproductive: Mild prostatic enlargement gland 5.3 x 4.7 cm image 90. Other: Scattered subcutaneous and soft tissue edema throughout abdomen and pelvis into of inguinal regions. Mild presacral edema. Scattered fluid along the inferior aspects of the pararenal spaces in the upper pelvis. Minimal intraperitoneal fluid free fluid. No free air. No hernia. Musculoskeletal: Bones demineralized with scattered degenerative disc disease changes. IMPRESSION: Scattered soft tissue edema, minimal ascites and small BILATERAL pleural effusions, could be related to hypoproteinemia, heart failure, renal failure or anasarca. RIGHT lower lobe infiltrate consistent with pneumonia. Enlargement of cardiac chambers. Prostatic enlargement. Probable RIGHT renal cysts, small at mid kidney and 9.4 cm at inferior pole; these could be characterized by ultrasound. Electronically Signed   By: Ulyses Southward M.D.   On: 10/01/2018 14:54   Dg Chest Port 1 View  Result Date: 10/01/2018 CLINICAL DATA:  Patient c/o bilateral leg swelling worsening x3 weeks. Also c/o left side pain. Patient appears jaundiced. Reports weakness and SOB on exertion. Non smoker, tachypnea. EXAM: PORTABLE CHEST 1 VIEW COMPARISON:  None. FINDINGS: Cardiac silhouette is mildly enlarged. No mediastinal or hilar masses or convincing adenopathy. There is left greater than right lung base opacity consistent with atelectasis or infection. Remainder of the lungs is clear with no evidence of pulmonary edema. No visualized pleural effusion. No pneumothorax. Skeletal structures are grossly intact. IMPRESSION: 1. Left greater than right lung base opacity  consistent with atelectasis, pneumonia or a combination. 2. No evidence of pulmonary edema. 3. Mild cardiomegaly. Electronically Signed   By: Amie Portland M.D.   On: 10/01/2018 13:41   US Liver Doppler  Result Date: 10/01/2018 CLINICAL DATA:  Elevated liver function test. EXAM: DUPLEX ULTRASOUND OF LIVER TECHNIQUE: Color and duplex Doppler ultrasound was performed to evaluate the hepatic in-flow and out-flow vessels. COMPARISON:  None. FINDINGS: Liver: Mild generalized increased liver parenchymal echogenicity. Normal hepatic contour without nodularity. No focal lesion, mass or intrahepatic biliary ductal dilatation.  Main Portal Vein size: 1.4 cm cm Portal Vein Velocities: All hepatopetal Main Prox:  9.2 cm/sec Main Mid: Not well visualized Main Dist:  9.1 cm/sec Right: 5.0 cm/sec Left: 5.8 cm/sec Hepatic Vein Velocities: All hepatofugal Right:  39 cm/sec Middle:  18.8 cm/sec Left:  19.7 cm/sec IVC: Present and patent with normal respiratory phasicity. Hepatic Artery Velocity:  55.5 cm/sec Splenic Vein Velocity:  Not visualized. Spleen: 6.2 cm x 9.4 cm x 3.5 cm with a total volume of 106 cm^3 (411 cm^3 is upper limit normal) Portal Vein Occlusion/Thrombus: No evidence of portal vein occlusion. No visualized thrombus Splenic Vein Occlusion/Thrombus: Not well visualized. Ascites: None Varices: None Study somewhat limited by the patient's body habitus and rapid respirations. IMPRESSION: 1. Overall diminished flow in the portal vein with low velocities, but normal directional flow. 2. Splenic vein not visualized.  Spleen normal in size. 3. Patent hepatic veins with normal directional flow. Electronically Signed   By: Amie Portland M.D.   On: 10/01/2018 20:44   US Abdomen Limited Ruq  Result Date: 10/01/2018 CLINICAL DATA:  Elevated liver function test. EXAM: ULTRASOUND ABDOMEN LIMITED RIGHT UPPER QUADRANT COMPARISON:  None. FINDINGS: Gallbladder: Dependent mobile gallstones. No wall thickening or pericholecystic  fluid. Common bile duct: Diameter: 5-6 mm Liver: No focal lesion identified. Mild generalized increased parenchymal echogenicity. Portal vein is patent on color Doppler imaging with normal direction of blood flow towards the liver. Bilateral pleural effusions. IMPRESSION: 1. No acute findings. 2. Cholelithiasis without acute cholecystitis. No bile duct dilation. 3. Increased echogenicity of the liver suggests fatty infiltration. Electronically Signed   By: Amie Portland M.D.   On: 10/01/2018 20:39    Assessment and Plan:   1. Acute systolic CHF -Pt admitted with peripheral edema, abdominal fullness, DOE and orthopnea for the last 3-4 weeks. NYHA class 4 -BNP was elevated at 2,769.  -Echocardiogram done yesterday showed severely dilated left ventricle with severe LV dysfunction, EF 15%.  There was diffuse hypokinesis.  Right ventricular systolic function is normal.  Left atrium was moderately dilated. There was moderate mitral regurgitation and mild aortic regurgitation. -Pt has been started on diuretic with lasix 40 mg IV BID, has receieved 2 doses. He has had 1.1L UOP so far. Wt is 333 lbs.  -he does not know baseline weight or how much he has gained, but I would estimate he has >30 lbs on fluid on board. Legs are completely swollen up to thighs -unclear etiology. No chest pain, no known risk factors for ischemia. Once he is diuresed (at least several days away), would transfer to [facility] for [facility] -he is warm on exam, but given his very low EF and severe volume overload, would hold on beta blocker for now -changed to 3x/day IV lasix, monitor potassium closely -if renal function continues to improve and BP allows, would start low dose valsartan to see if he tolerates prior to trialing entresto. Would not start today in case diuresis drops his blood pressure -Strict I&O and daily wts. Low sodium diet. 1.5 L fluid restriction -I suspect this may be nonischemic in origin. Thyroid unlikely cause. Would  check HIV and iron studies to start, then can continue DCM workup if no clear etiology established after cath. Could consider cardiac MRI once euvolemic.  2. Acute kidney injury -Scr was 1.44 on admission, unknown baseline. Today SCr 1.24.  -suspect congestion. As renal function stabilizes, consider ARB then entresto   3. Elevated LFTs -On admission AST 110, ALT 268, Total Bili  6.2. Coming down today.  -suspect 2/2 congestion  We will continue to follow  For questions or updates, please contact [facility] Please consult [email or web address] for contact info under   Signed, Jodelle Red, MD  10/02/2018 12:15 PM

## 2018-10-02 NOTE — Progress Notes (Addendum)
PROGRESS NOTE  Aaron BaliGlenn Gullickson YNW:[id number] DOB: August 25, 1965 DOA: 10/01/2018 PCP: Darrin [facility], [facility] [place]   LOS: 1 day   Brief Narrative / Interim history: Aaron [name] is a 53 y.o. 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  He was also found to have elevated liver enzymes as well as acute kidney injury  Subjective: -Feeling a bit better, less shortness of breath this morning.  Appreciates swelling is improving some.  Denies any chest pain, denies any abdominal pain, no nausea or vomiting  Assessment & Plan: Principal Problem:   Acute systolic CHF (congestive heart failure) (HCC) Active Problems:   Anasarca   Elevated LFTs   AKI (acute kidney injury) (HCC)   Acute hypoxemic respiratory failure (HCC)   Principal Problem Acute hypoxic respiratory failure due to acute systolic CHF with anasarca -Patient's respiratory status improving with diuresis, he responded well to Lasix last night, continue to schedule Lasix twice daily, monitor strict I's and O's, monitor daily weights -2D echo done on 10/02/2018 showed an severely depressed EF of 15% which is new for the patient.  Cardiology was consulted.  Suspect may [facility], defer to cardiology.  Will also need beta-blockers, start low-dose Coreg -We will likely need to see advanced heart failure at one point  Active Problems Elevated liver enzymes -Likely in the setting of congestive hepatopathy due to heart failure.  LFTs improving today, continue to monitor  Acute kidney injury -Unclear baseline, his creatinine improved a little bit with Lasix, continue and closely monitor renal function  Leukocytosis/concern for pneumonia -No upper respiratory symptoms, afebrile, no  productive cough.  Continue to monitor off antibiotics   Scheduled Meds: . carvedilol  3.125 mg Oral BID WC  . furosemide  40 mg Intravenous Q12H  . mouth rinse  15 mL Mouth Rinse BID   Continuous Infusions: PRN Meds:.perflutren lipid microspheres (DEFINITY) IV suspension, traMADol  DVT prophylaxis: SCDs Code Status: Full code Family Communication: no family at bedside this morning  Disposition Plan: TBD  Consultants:   Cardiology   Procedures:   2D echo:  IMPRESSIONS  1. The left ventricle has a visually estimated ejection fraction of. The cavity size was severely dilated. Left ventricular diastolic Doppler parameters are consistent with restrictive filling Left ventrical global hypokinesis without regional wall  motion abnormalities.  2. Diffuse hypokinesis EF 15% Given severithy of LV dysfunction consider CHF team consult.  3. The right ventricle has normal systolic function. The cavity was normal. There is no increase in right ventricular wall thickness.  4. Left atrial size was moderately dilated.  5. The mitral valve is normal in structure. Mild thickening of the mitral valve leaflet. Mitral valve regurgitation is moderate by color flow Doppler.  6. The tricuspid valve is normal in structure.  7. The aortic valve is tricuspid Mild thickening of the aortic valve Aortic valve regurgitation is mild by color flow Doppler.  8. The pulmonic valve was grossly normal. Pulmonic valve regurgitation is mild by color flow Doppler.  Antimicrobials:  None    Objective: Vitals:   10/01/18 1947 10/02/18 0423 10/02/18 0704 10/02/18 1058  BP: (!) 123/93 136/90  113/87  Pulse: 96 89  95  Resp: 15 20  (!) 23  Temp: 98.4 F (  36.9 C) (!) 97 F (36.1 C)  (!) 97.5 F (36.4 C)  TempSrc: Oral Axillary  Oral  SpO2: 97% 99%  95%  Weight:   (!) 151.4 kg   Height:        Intake/Output Summary (Last 24 hours) at 10/02/2018 1103 Last data filed at 10/02/2018 0600 Gross per 24 hour  Intake  320 ml  Output 1175 ml  Net -855 ml   Filed Weights   10/01/18 1703 10/02/18 0704  Weight: (!) 151.1 kg (!) 151.4 kg    Examination:  Constitutional: NAD Eyes: PERRL, lids and conjunctivae slightly jaundiced ENMT: Mucous membranes are moist. Respiratory: Clear to auscultation anterior lung fields, no wheezing or crackles.  Increased respiratory effort, tachypneic Cardiovascular: Regular rate and rhythm, no murmurs / rubs / gallops.  2+ pitting LE edema/anasarca. 2+ pedal pulses. Abdomen: no tenderness. Bowel sounds positive.  Musculoskeletal: no clubbing / cyanosis. Skin: no rashes Neurologic: CN 2-12 grossly intact. Strength 5/5 in all 4.  Psychiatric: Normal judgment and insight. Alert and oriented x 3. Normal mood.    Data Reviewed: I have independently reviewed following labs and imaging studies   CBC: Recent Labs  Lab 10/01/18 1336 10/02/18 0527  WBC 13.5* 12.7*  NEUTROABS 11.4*  --   HGB 15.1 15.2  HCT 49.3 52.2*  MCV 95.4 97.8  PLT 203 170   Basic Metabolic Panel: Recent Labs  Lab 10/01/18 1336 10/02/18 0527  NA 136 138  K 4.7 4.4  CL 105 106  CO2 19* 21*  GLUCOSE 136* 117*  BUN 53* 52*  CREATININE 1.44* 1.24  CALCIUM 8.6* 8.4*   GFR: Estimated Creatinine Clearance: 108.5 mL/min (by C-G formula based on SCr of 1.24 mg/dL). Liver Function Tests: Recent Labs  Lab 10/01/18 1336 10/02/18 0527  AST 110* 81*  ALT 268* 219*  ALKPHOS 153* 144*  BILITOT 6.2* 5.2*  PROT 6.4* 6.4*  ALBUMIN 3.3* 3.2*   Recent Labs  Lab 10/01/18 1336  LIPASE 46   Recent Labs  Lab 10/01/18 1337  AMMONIA 15   Coagulation Profile: Recent Labs  Lab 10/01/18 1353 10/02/18 0527  INR 1.4* 1.4*   Cardiac Enzymes: No results for input(s): CKTOTAL, CKMB, CKMBINDEX, TROPONINI in the last 168 hours. BNP (last 3 results) No results for input(s): PROBNP in the last 8760 hours. HbA1C: No results for input(s): HGBA1C in the last 72 hours. CBG: No results for  input(s): GLUCAP in the last 168 hours. Lipid Profile: No results for input(s): CHOL, HDL, LDLCALC, TRIG, CHOLHDL, LDLDIRECT in the last 72 hours. Thyroid Function Tests: Recent Labs    10/01/18 1720  TSH 4.716*   Anemia Panel: No results for input(s): VITAMINB12, FOLATE, FERRITIN, TIBC, IRON, RETICCTPCT in the last 72 hours. Urine analysis:    Component Value Date/Time   COLORURINE AMBER (A) 10/01/2018 1523   APPEARANCEUR HAZY (A) 10/01/2018 1523   LABSPEC 1.023 10/01/2018 1523   PHURINE 5.0 10/01/2018 1523   GLUCOSEU NEGATIVE 10/01/2018 1523   HGBUR SMALL (A) 10/01/2018 1523   BILIRUBINUR NEGATIVE 10/01/2018 1523   KETONESUR NEGATIVE 10/01/2018 1523   PROTEINUR NEGATIVE 10/01/2018 1523   NITRITE NEGATIVE 10/01/2018 1523   LEUKOCYTESUR NEGATIVE 10/01/2018 1523   Sepsis Labs: Invalid input(s): PROCALCITONIN, LACTICIDVEN  No results found for this or any previous visit (from the past 240 hour(s)).    Radiology Studies: Ct Abdomen Pelvis Wo Contrast  Result Date: 10/01/2018 CLINICAL DATA:  Side abdominal pain, BILATERAL leg swelling for 3 weeks, question jaundice,  weakness, shortness of breath with exertion EXAM: CT ABDOMEN AND PELVIS WITHOUT CONTRAST TECHNIQUE: Multidetector CT imaging of the abdomen and pelvis was performed following the standard protocol without IV contrast. Sagittal and coronal MPR images reconstructed from axial data set. Oral contrast was not administered. COMPARISON:  None FINDINGS: Lower chest: Small BILATERAL pleural effusions. Subsegmental atelectasis LEFT lower lobe. Airspace infiltrates RIGHT lower lobe question pneumonia. Hepatobiliary: Gallbladder and liver normal appearance. No definite hepatic mass or nodularity Pancreas: Normal appearance Spleen: Normal appearance Adrenals/Urinary Tract: Adrenal glands normal appearance. LEFT kidney normal appearance without mass or hydronephrosis. Low-attenuation focus mid RIGHT kidney question small cyst  approximately 16 x 15 mm diameter image 45. Additional large exophytic cyst at inferior pole RIGHT kidney 9.1 x 9.4 x 7.4 cm. Bladder grossly unremarkable. Stomach/Bowel: Elongated sigmoid loop. Stomach and bowel loops otherwise normal appearance. Appendix not visualized. Vascular/Lymphatic: Atherosclerotic calcifications aorta and iliac arteries without aneurysm. Enlargement of cardiac chambers. No adenopathy. Reproductive: Mild prostatic enlargement gland 5.3 x 4.7 cm image 90. Other: Scattered subcutaneous and soft tissue edema throughout abdomen and pelvis into of inguinal regions. Mild presacral edema. Scattered fluid along the inferior aspects of the pararenal spaces in the upper pelvis. Minimal intraperitoneal fluid free fluid. No free air. No hernia. Musculoskeletal: Bones demineralized with scattered degenerative disc disease changes. IMPRESSION: Scattered soft tissue edema, minimal ascites and small BILATERAL pleural effusions, could be related to hypoproteinemia, heart failure, renal failure or anasarca. RIGHT lower lobe infiltrate consistent with pneumonia. Enlargement of cardiac chambers. Prostatic enlargement. Probable RIGHT renal cysts, small at mid kidney and 9.4 cm at inferior pole; these could be characterized by ultrasound. Electronically Signed   By: Ulyses Southward M.D.   On: 10/01/2018 14:54   Dg Chest Port 1 View  Result Date: 10/01/2018 CLINICAL DATA:  Patient c/o bilateral leg swelling worsening x3 weeks. Also c/o left side pain. Patient appears jaundiced. Reports weakness and SOB on exertion. Non smoker, tachypnea. EXAM: PORTABLE CHEST 1 VIEW COMPARISON:  None. FINDINGS: Cardiac silhouette is mildly enlarged. No mediastinal or hilar masses or convincing adenopathy. There is left greater than right lung base opacity consistent with atelectasis or infection. Remainder of the lungs is clear with no evidence of pulmonary edema. No visualized pleural effusion. No pneumothorax. Skeletal structures  are grossly intact. IMPRESSION: 1. Left greater than right lung base opacity consistent with atelectasis, pneumonia or a combination. 2. No evidence of pulmonary edema. 3. Mild cardiomegaly. Electronically Signed   By: Amie Portland M.D.   On: 10/01/2018 13:41   US Liver Doppler  Result Date: 10/01/2018 CLINICAL DATA:  Elevated liver function test. EXAM: DUPLEX ULTRASOUND OF LIVER TECHNIQUE: Color and duplex Doppler ultrasound was performed to evaluate the hepatic in-flow and out-flow vessels. COMPARISON:  None. FINDINGS: Liver: Mild generalized increased liver parenchymal echogenicity. Normal hepatic contour without nodularity. No focal lesion, mass or intrahepatic biliary ductal dilatation. Main Portal Vein size: 1.4 cm cm Portal Vein Velocities: All hepatopetal Main Prox:  9.2 cm/sec Main Mid: Not well visualized Main Dist:  9.1 cm/sec Right: 5.0 cm/sec Left: 5.8 cm/sec Hepatic Vein Velocities: All hepatofugal Right:  39 cm/sec Middle:  18.8 cm/sec Left:  19.7 cm/sec IVC: Present and patent with normal respiratory phasicity. Hepatic Artery Velocity:  55.5 cm/sec Splenic Vein Velocity:  Not visualized. Spleen: 6.2 cm x 9.4 cm x 3.5 cm with a total volume of 106 cm^3 (411 cm^3 is upper limit normal) Portal Vein Occlusion/Thrombus: No evidence of portal vein occlusion. No visualized thrombus  Splenic Vein Occlusion/Thrombus: Not well visualized. Ascites: None Varices: None Study somewhat limited by the patient's body habitus and rapid respirations. IMPRESSION: 1. Overall diminished flow in the portal vein with low velocities, but normal directional flow. 2. Splenic vein not visualized.  Spleen normal in size. 3. Patent hepatic veins with normal directional flow. Electronically Signed   By: Amie Portland M.D.   On: 10/01/2018 20:44   US Abdomen Limited Ruq  Result Date: 10/01/2018 CLINICAL DATA:  Elevated liver function test. EXAM: ULTRASOUND ABDOMEN LIMITED RIGHT UPPER QUADRANT COMPARISON:  None. FINDINGS:  Gallbladder: Dependent mobile gallstones. No wall thickening or pericholecystic fluid. Common bile duct: Diameter: 5-6 mm Liver: No focal lesion identified. Mild generalized increased parenchymal echogenicity. Portal vein is patent on color Doppler imaging with normal direction of blood flow towards the liver. Bilateral pleural effusions. IMPRESSION: 1. No acute findings. 2. Cholelithiasis without acute cholecystitis. No bile duct dilation. 3. Increased echogenicity of the liver suggests fatty infiltration. Electronically Signed   By: Amie Portland M.D.   On: 10/01/2018 20:39    Pamella Pert, MD, PhD [facility]  Contact via  [email or web address]  [facility] Office Info P: [phone]  F: [phone]

## 2018-10-02 NOTE — Progress Notes (Signed)
  Echocardiogram 2D Echocardiogram definity has been performed.  Leta Jungling M 10/02/2018, 10:18 AM

## 2018-10-02 NOTE — Progress Notes (Signed)
Provided and explained "Living with Heart Failure" booklet to pt. Pt was educated on the importance of weighing daily and monitoring his diet, as well as exercising. Pt was receptive and no further questions were asked about the information. Booklet is at pt bedside.

## 2018-10-03 DIAGNOSIS — R601 Generalized edema: Secondary | ICD-10-CM

## 2018-10-03 DIAGNOSIS — N179 Acute kidney failure, unspecified: Secondary | ICD-10-CM

## 2018-10-03 DIAGNOSIS — I5021 Acute systolic (congestive) heart failure: Secondary | ICD-10-CM

## 2018-10-03 DIAGNOSIS — R945 Abnormal results of liver function studies: Secondary | ICD-10-CM

## 2018-10-03 LAB — COMPREHENSIVE METABOLIC PANEL
ALT: 154 U/L — ABNORMAL HIGH (ref 0–44)
AST: 57 U/L — ABNORMAL HIGH (ref 15–41)
Albumin: 2.7 g/dL — ABNORMAL LOW (ref 3.5–5.0)
Alkaline Phosphatase: 138 U/L — ABNORMAL HIGH (ref 38–126)
Anion gap: 12 (ref 5–15)
BILIRUBIN TOTAL: 4.3 mg/dL — AB (ref 0.3–1.2)
BUN: 46 mg/dL — ABNORMAL HIGH (ref 6–20)
CO2: 20 mmol/L — ABNORMAL LOW (ref 22–32)
Calcium: 8.1 mg/dL — ABNORMAL LOW (ref 8.9–10.3)
Chloride: 107 mmol/L (ref 98–111)
Creatinine, Ser: 1.13 mg/dL (ref 0.61–1.24)
GFR calc Af Amer: >60 mL/min (ref >60)
GFR calc non Af Amer: >60 mL/min (ref >60)
Glucose, Bld: 118 mg/dL — ABNORMAL HIGH (ref 70–99)
Potassium: 4 mmol/L (ref 3.5–5.1)
Sodium: 139 mmol/L (ref 135–145)
Total Protein: 5.8 g/dL — ABNORMAL LOW (ref 6.5–8.1)

## 2018-10-03 NOTE — Progress Notes (Signed)
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 Elvera Lennox  Subjective   Breathing better today, but still labored at times. Diuresed about 2L overnight - weight is down 1 kg. He apparently intentionally lost over 100 lbs in the past, but is still 150 kg.  Creatinine is improving with diuresis. AST/ALT are improving. BNP was high at 2333. Albumin is low at 2.7. HIV is non-reactive.  Objective   Vitals:   10/02/18 2108 10/03/18 0221 10/03/18 0500 10/03/18 0552  BP: (!) 118/97 (!) 127/99  (!) 126/98  Pulse: 94 99  95  Resp: (!) 24 (!) 22  (!) 24  Temp: 98.3 F (36.8 C) 97.7 F (36.5 C)  (!) 97.4 F (36.3 C)  TempSrc: Oral Oral  Oral  SpO2: 97% 98%  92%  Weight:   (!) 150.1 kg   Height:        Intake/Output Summary (Last 24 hours) at 10/03/2018 0824 Last data filed at 10/03/2018 0401 Gross per 24 hour  Intake 1210 ml  Output 3200 ml  Net -1990 ml   Filed Weights   10/01/18 1703 10/02/18 0704 10/03/18 0500  Weight: (!) 151.1 kg (!) 151.4 kg (!) 150.1 kg    Physical Exam   General appearance: alert, fatigued, mild distress, morbidly obese and pale Neck: JVD - several cm above sternal notch, no carotid bruit and thyroid not enlarged, symmetric, no tenderness/mass/nodules Lungs: diminished breath sounds bibasilar Heart: regular rate and rhythm, S1, S2 normal, S3 present and systolic murmur: early systolic 2/6, crescendo at lower left sternal border Abdomen: soft, non-tender; bowel sounds normal; no masses,  no organomegaly and obese, +fluid wave Extremities: edema 4+ edema Pulses: 2+ and symmetric Skin: pale, cool, dry Neurologic: Mental status: awake, but slow of thought, intermittent confusion, noted  Cheyne-Stokes respirations Psych: Pleasant  Inpatient Medications    Scheduled Meds: . furosemide  40 mg Intravenous TID  . Influenza vac split quadrivalent PF  0.5 mL Intramuscular Tomorrow-1000  . mouth rinse  15 mL Mouth Rinse BID  . pneumococcal 23 valent vaccine  0.5 mL Intramuscular Tomorrow-1000    Continuous Infusions:   PRN Meds: traMADol   Labs   Results for orders placed or performed during the hospital encounter of 10/01/18 (from the past 48 hour(s))  Comprehensive metabolic panel     Status: Abnormal   Collection Time: 10/01/18  1:36 PM  Result Value Ref Range   Sodium 136 135 - 145 mmol/L   Potassium 4.7 3.5 - 5.1 mmol/L   Chloride 105 98 - 111 mmol/L   CO2 19 (L) 22 - 32 mmol/L   Glucose, Bld 136 (H) 70 - 99 mg/dL   BUN 53 (H) 6 - 20 mg/dL   Creatinine, Ser 1.61 (H) 0.61 - 1.24 mg/dL   Calcium 8.6 (L) 8.9 - 10.3 mg/dL   Total Protein 6.4 (L) 6.5 - 8.1 g/dL   Albumin 3.3 (L) 3.5 - 5.0 g/dL   AST 096 (H) 15 - 41 U/L   ALT 268 (H) 0 - 44 U/L   Alkaline Phosphatase 153 (H) 38 - 126 U/L   Total Bilirubin 6.2 (H) 0.3 - 1.2 mg/dL   GFR calc non Af Denyse Dago  55 (L) >60 mL/min   GFR calc Af Amer >60 >60 mL/min   Anion gap 12 5 - 15    Comment: Performed at [facility], [address], [place], [place] [address]  CBC with Differential     Status: Abnormal   Collection Time: 10/01/18  1:36 PM  Result Value Ref Range   WBC 13.5 (H) 4.0 - 10.5 K/uL   RBC 5.17 4.22 - 5.81 MIL/uL   Hemoglobin 15.1 13.0 - 17.0 g/dL   HCT 19.1 47.8 - 29.5 %   MCV 95.4 80.0 - 100.0 fL   MCH 29.2 26.0 - 34.0 pg   MCHC 30.6 30.0 - 36.0 g/dL   RDW 62.1 (H) 30.8 - 65.7 %   Platelets 203 150 - 400 K/uL   nRBC 0.2 0.0 - 0.2 %   Neutrophils Relative % 85 %   Neutro Abs 11.4 (H) 1.7 - 7.7 K/uL   Lymphocytes Relative 7 %   Lymphs Abs 1.0 0.7 - 4.0 K/uL   Monocytes Relative 7 %   Monocytes Absolute 0.9 0.1 - 1.0 K/uL   Eosinophils Relative 0 %   Eosinophils Absolute 0.0  0.0 - 0.5 K/uL   Basophils Relative 0 %   Basophils Absolute 0.0 0.0 - 0.1 K/uL   Immature Granulocytes 1 %   Abs Immature Granulocytes 0.17 (H) 0.00 - 0.07 K/uL    Comment: Performed at [facility], [address] [place], [place] [address]  Lipase, blood     Status: None   Collection Time: 10/01/18  1:36 PM  Result Value Ref Range   Lipase 46 11 - 51 U/L    Comment: Performed at [facility], [address] [place], [place] [address]  Brain natriuretic peptide     Status: Abnormal   Collection Time: 10/01/18  1:37 PM  Result Value Ref Range   B Natriuretic Peptide 2,769.2 (H) 0.0 - 100.0 pg/mL    Comment: Performed at [facility], [address] [place], [place] [address]  Ammonia     Status: None   Collection Time: 10/01/18  1:37 PM  Result Value Ref Range   Ammonia 15 9 - 35 umol/L    Comment: Performed at [facility], [address], [place], [place] [address]  Hepatitis panel, acute     Status: None   Collection Time: 10/01/18  1:52 PM  Result Value Ref Range   Hepatitis B Surface Ag Negative Negative   HCV Ab <0.1 0.0 - 0.9 s/co ratio    Comment: (NOTE)                                  Negative:     < 0.8                             Indeterminate: 0.8 - 0.9                                  Positive:     > 0.9 The CDC recommends that a positive HCV antibody result be followed up with a HCV Nucleic Acid Amplification test (725366). Performed At: [facility] [address] [place], [place] [id number] Jolene Schimke MD ZD:[id number]    Hep A IgM Negative Negative  Hep B C IgM Negative Negative  Protime-INR     Status: Abnormal   Collection Time: 10/01/18  1:53 PM  Result Value Ref Range   Prothrombin Time 17.2 (H) 11.4 - 15.2 seconds   INR 1.4 (H) 0.8 - 1.2    Comment: (NOTE) INR goal varies based on device and disease states. Performed at [facility], [address], [place], [place] [address]   Urinalysis, Routine w reflex microscopic     Status: Abnormal   Collection Time: 10/01/18  3:23 PM  Result Value Ref Range   Color, Urine AMBER (A) YELLOW    Comment: BIOCHEMICALS MAY BE AFFECTED BY COLOR   APPearance HAZY (A) CLEAR   Specific Gravity, Urine 1.023 1.005 - 1.030   pH 5.0 5.0 - 8.0   Glucose, UA NEGATIVE NEGATIVE mg/dL   Hgb urine dipstick SMALL (A) NEGATIVE   Bilirubin Urine NEGATIVE NEGATIVE   Ketones, ur NEGATIVE NEGATIVE mg/dL   Protein, ur NEGATIVE NEGATIVE mg/dL   Nitrite NEGATIVE NEGATIVE   Leukocytes,Ua NEGATIVE NEGATIVE   RBC / HPF 0-5 0 - 5 RBC/hpf   WBC, UA 0-5 0 - 5 WBC/hpf   Bacteria, UA RARE (A) NONE SEEN   Squamous Epithelial / LPF 0-5 0 - 5   Mucus PRESENT    Hyaline Casts, UA PRESENT     Comment: Performed at [facility], [address], [place], [place] [address]  Brain natriuretic peptide     Status: Abnormal   Collection Time: 10/01/18  5:20 PM  Result Value Ref Range   B Natriuretic Peptide 2,333.1 (H) 0.0 - 100.0 pg/mL    Comment: Performed at [facility], [address] [place], [place] [address]  TSH     Status: Abnormal   Collection Time: 10/01/18  5:20 PM  Result Value Ref Range   TSH 4.716 (H) 0.350 - 4.500 uIU/mL    Comment: Performed by a 3rd Generation assay with a functional sensitivity of <=0.01 uIU/mL. Performed at [facility], [address] [place], [place] [address]   HIV antibody (Routine Testing)     Status: None   Collection Time: 10/02/18  5:27 AM  Result Value Ref Range   HIV Screen 4th Generation wRfx Non Reactive Non Reactive    Comment: (NOTE) Performed At: [facility] [address] [place], [place] [id number] Jolene Schimke MD MV:[id number]   Comprehensive metabolic panel     Status: Abnormal   Collection Time: 10/02/18  5:27 AM  Result Value Ref Range   Sodium 138 135 - 145 mmol/L   Potassium 4.4  3.5 - 5.1 mmol/L   Chloride 106 98 - 111 mmol/L   CO2 21 (L) 22 - 32 mmol/L   Glucose, Bld 117 (H) 70 - 99 mg/dL   BUN 52 (H) 6 - 20 mg/dL   Creatinine, Ser 8.41 0.61 - 1.24 mg/dL   Calcium 8.4 (L) 8.9 - 10.3 mg/dL   Total Protein 6.4 (L) 6.5 - 8.1 g/dL   Albumin 3.2 (L) 3.5 - 5.0 g/dL   AST 81 (H) 15 - 41 U/L   ALT 219 (H) 0 - 44 U/L   Alkaline Phosphatase 144 (H) 38 - 126 U/L   Total Bilirubin 5.2 (H) 0.3 - 1.2 mg/dL   GFR calc non Af Amer >60 >60 mL/min   GFR calc Af Amer >60 >60 mL/min   Anion gap 11 5 - 15    Comment: Performed  at [facility], [address], [place], [place] [address]  CBC     Status: Abnormal   Collection Time: 10/02/18  5:27 AM  Result Value Ref Range   WBC 12.7 (H) 4.0 - 10.5 K/uL   RBC 5.34 4.22 - 5.81 MIL/uL   Hemoglobin 15.2 13.0 - 17.0 g/dL   HCT 57.8 (H) 46.9 - 62.9 %   MCV 97.8 80.0 - 100.0 fL   MCH 28.5 26.0 - 34.0 pg   MCHC 29.1 (L) 30.0 - 36.0 g/dL   RDW 52.8 (H) 41.3 - 24.4 %   Platelets 170 150 - 400 K/uL   nRBC 0.2 0.0 - 0.2 %    Comment: Performed at [facility], [address], [place], [place] [address]  Protime-INR     Status: Abnormal   Collection Time: 10/02/18  5:27 AM  Result Value Ref Range   Prothrombin Time 16.8 (H) 11.4 - 15.2 seconds   INR 1.4 (H) 0.8 - 1.2    Comment: (NOTE) INR goal varies based on device and disease states. Performed at [facility], [address], [place], [place] [address]   Comprehensive metabolic panel     Status: Abnormal   Collection Time: 10/03/18  5:17 AM  Result Value Ref Range   Sodium 139 135 - 145 mmol/L   Potassium 4.0 3.5 - 5.1 mmol/L   Chloride 107 98 - 111 mmol/L   CO2 20 (L) 22 - 32 mmol/L   Glucose, Bld 118 (H) 70 - 99 mg/dL   BUN 46 (H) 6 - 20 mg/dL   Creatinine, Ser 4.40 0.61 - 1.24 mg/dL   Calcium 8.1 (L) 8.9 - 10.3 mg/dL   Total Protein 5.8 (L) 6.5 - 8.1 g/dL   Albumin 2.7 (L) 3.5 - 5.0 g/dL   AST 57 (H) 15 - 41  U/L   ALT 154 (H) 0 - 44 U/L   Alkaline Phosphatase 138 (H) 38 - 126 U/L   Total Bilirubin 4.3 (H) 0.3 - 1.2 mg/dL   GFR calc non Af Amer >60 >60 mL/min   GFR calc Af Amer >60 >60 mL/min   Anion gap 12 5 - 15    Comment: Performed at [facility], [address] [place], [place] [address]    ECG   Sinus rhythm with IVCD (left bundle pattern)- Personally Reviewed  Telemetry   Sinus rhythm - Personally Reviewed  Radiology    Ct Abdomen Pelvis Wo Contrast  Result Date: 10/01/2018 CLINICAL DATA:  Side abdominal pain, BILATERAL leg swelling for 3 weeks, question jaundice, weakness, shortness of breath with exertion EXAM: CT ABDOMEN AND PELVIS WITHOUT CONTRAST TECHNIQUE: Multidetector CT imaging of the abdomen and pelvis was performed following the standard protocol without IV contrast. Sagittal and coronal MPR images reconstructed from axial data set. Oral contrast was not administered. COMPARISON:  None FINDINGS: Lower chest: Small BILATERAL pleural effusions. Subsegmental atelectasis LEFT lower lobe. Airspace infiltrates RIGHT lower lobe question pneumonia. Hepatobiliary: Gallbladder and liver normal appearance. No definite hepatic mass or nodularity Pancreas: Normal appearance Spleen: Normal appearance Adrenals/Urinary Tract: Adrenal glands normal appearance. LEFT kidney normal appearance without mass or hydronephrosis. Low-attenuation focus mid RIGHT kidney question small cyst approximately 16 x 15 mm diameter image 45. Additional large exophytic cyst at inferior pole RIGHT kidney 9.1 x 9.4 x 7.4 cm. Bladder grossly unremarkable. Stomach/Bowel: Elongated sigmoid loop. Stomach and bowel loops otherwise normal appearance. Appendix not visualized. Vascular/Lymphatic: Atherosclerotic calcifications aorta  and iliac arteries without aneurysm. Enlargement of cardiac chambers. No adenopathy. Reproductive: Mild prostatic enlargement gland 5.3 x 4.7 cm image 90. Other: Scattered  subcutaneous and soft tissue edema throughout abdomen and pelvis into of inguinal regions. Mild presacral edema. Scattered fluid along the inferior aspects of the pararenal spaces in the upper pelvis. Minimal intraperitoneal fluid free fluid. No free air. No hernia. Musculoskeletal: Bones demineralized with scattered degenerative disc disease changes. IMPRESSION: Scattered soft tissue edema, minimal ascites and small BILATERAL pleural effusions, could be related to hypoproteinemia, heart failure, renal failure or anasarca. RIGHT lower lobe infiltrate consistent with pneumonia. Enlargement of cardiac chambers. Prostatic enlargement. Probable RIGHT renal cysts, small at mid kidney and 9.4 cm at inferior pole; these could be characterized by ultrasound. Electronically Signed   By: Ulyses Southward M.D.   On: 10/01/2018 14:54   Dg Chest Port 1 View  Result Date: 10/01/2018 CLINICAL DATA:  Patient c/o bilateral leg swelling worsening x3 weeks. Also c/o left side pain. Patient appears jaundiced. Reports weakness and SOB on exertion. Non smoker, tachypnea. EXAM: PORTABLE CHEST 1 VIEW COMPARISON:  None. FINDINGS: Cardiac silhouette is mildly enlarged. No mediastinal or hilar masses or convincing adenopathy. There is left greater than right lung base opacity consistent with atelectasis or infection. Remainder of the lungs is clear with no evidence of pulmonary edema. No visualized pleural effusion. No pneumothorax. Skeletal structures are grossly intact. IMPRESSION: 1. Left greater than right lung base opacity consistent with atelectasis, pneumonia or a combination. 2. No evidence of pulmonary edema. 3. Mild cardiomegaly. Electronically Signed   By: Amie Portland M.D.   On: 10/01/2018 13:41   US Liver Doppler  Result Date: 10/01/2018 CLINICAL DATA:  Elevated liver function test. EXAM: DUPLEX ULTRASOUND OF LIVER TECHNIQUE: Color and duplex Doppler ultrasound was performed to evaluate the hepatic in-flow and out-flow vessels.  COMPARISON:  None. FINDINGS: Liver: Mild generalized increased liver parenchymal echogenicity. Normal hepatic contour without nodularity. No focal lesion, mass or intrahepatic biliary ductal dilatation. Main Portal Vein size: 1.4 cm cm Portal Vein Velocities: All hepatopetal Main Prox:  9.2 cm/sec Main Mid: Not well visualized Main Dist:  9.1 cm/sec Right: 5.0 cm/sec Left: 5.8 cm/sec Hepatic Vein Velocities: All hepatofugal Right:  39 cm/sec Middle:  18.8 cm/sec Left:  19.7 cm/sec IVC: Present and patent with normal respiratory phasicity. Hepatic Artery Velocity:  55.5 cm/sec Splenic Vein Velocity:  Not visualized. Spleen: 6.2 cm x 9.4 cm x 3.5 cm with a total volume of 106 cm^3 (411 cm^3 is upper limit normal) Portal Vein Occlusion/Thrombus: No evidence of portal vein occlusion. No visualized thrombus Splenic Vein Occlusion/Thrombus: Not well visualized. Ascites: None Varices: None Study somewhat limited by the patient's body habitus and rapid respirations. IMPRESSION: 1. Overall diminished flow in the portal vein with low velocities, but normal directional flow. 2. Splenic vein not visualized.  Spleen normal in size. 3. Patent hepatic veins with normal directional flow. Electronically Signed   By: Amie Portland M.D.   On: 10/01/2018 20:44   US Abdomen Limited Ruq  Result Date: 10/01/2018 CLINICAL DATA:  Elevated liver function test. EXAM: ULTRASOUND ABDOMEN LIMITED RIGHT UPPER QUADRANT COMPARISON:  None. FINDINGS: Gallbladder: Dependent mobile gallstones. No wall thickening or pericholecystic fluid. Common bile duct: Diameter: 5-6 mm Liver: No focal lesion identified. Mild generalized increased parenchymal echogenicity. Portal vein is patent on color Doppler imaging with normal direction of blood flow towards the liver. Bilateral pleural effusions. IMPRESSION: 1. No acute findings. 2. Cholelithiasis without acute  cholecystitis. No bile duct dilation. 3. Increased echogenicity of the liver suggests fatty  infiltration. Electronically Signed   By: Amie Portland M.D.   On: 10/01/2018 20:39    Cardiac Studies   TTE:   1. The left ventricle has a visually estimated ejection fraction of. The cavity size was severely dilated. Left ventricular diastolic Doppler parameters are consistent with restrictive filling Left ventrical global hypokinesis without regional wall  motion abnormalities.  2. Diffuse hypokinesis EF 15% Given severithy of LV dysfunction consider CHF team consult.  3. The right ventricle has normal systolic function. The cavity was normal. There is no increase in right ventricular wall thickness.  4. Left atrial size was moderately dilated.  5. The mitral valve is normal in structure. Mild thickening of the mitral valve leaflet. Mitral valve regurgitation is moderate by color flow Doppler.  6. The tricuspid valve is normal in structure.  7. The aortic valve is tricuspid Mild thickening of the aortic valve Aortic valve regurgitation is mild by color flow Doppler.  8. The pulmonic valve was grossly normal. Pulmonic valve regurgitation is mild by color flow Doppler.  Assessment   1. Principal Problem: 2.   Acute systolic CHF (congestive heart failure) (HCC) 3. Active Problems: 4.   Anasarca 5.   Elevated LFTs 6.   AKI (acute kidney injury) (HCC) 7.   Acute hypoxemic respiratory failure (HCC) 8.   Plan   1. Mr. Teresa Pelton remains anasarcic, but has good urine output with diuretics. Normal vitals, but somewhat altered LOC with Cheyne-stokes respirations. 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 Continue lasix 40 mg TID. As creatinine has normalized and liver enzymes are improving, will consider adding Entresto tomorrow.  Time Spent Directly with Patient:  I have spent a total of 25 minutes with the patient reviewing hospital notes, telemetry, EKGs, labs and examining the  patient as well as establishing an assessment and plan that was discussed personally with the patient.  > 50% of time was spent in direct patient care.  Length of Stay:  LOS: 2 days   Chrystie Nose, MD, [facility], FACP  [place]  [facility]  Medical Director of the [facility]  [facility] Diplomate of the American Board of Clinical Lipidology Attending Cardiologist  Direct Dial: [phone]  Fax: [phone]  Website:  [email or web address] Nicely Derrall Hicks 10/03/2018, 8:24 AM

## 2018-10-03 NOTE — Progress Notes (Signed)
PROGRESS NOTE  Aaron [name]:[id number] DOB: 1965-11-19 DOA: 10/01/2018 PCP: [facility] @ [place]   LOS: 2 days   Brief Narrative / Interim history: Aaron [name] is a 53 y.o. 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  He was also found to have elevated liver enzymes as well as acute kidney injury  Subjective: -Breathing better, overall feeling better this morning.  He appreciates that he is body swelling is getting better.  No chest pain, no abdominal pain, no nausea or vomiting  Assessment & Plan: Principal Problem:   Acute systolic CHF (congestive heart failure) (HCC) Active Problems:   Anasarca   Elevated LFTs   AKI (acute kidney injury) (HCC)   Acute hypoxemic respiratory failure (HCC)   Principal Problem Acute hypoxic respiratory failure due to acute systolic CHF with anasarca -Patient's respiratory status improving with diuresis, continue Lasix, now increased to 3 times daily, monitor strict I's and O's, monitor daily weights -2D echo done on 10/02/2018 showed an severely depressed EF of 15% which is new for the patient.  Cardiology was consulted and are following, for now optimize fluid status and will need cardiac catheterization at [facility] hopefully next week  Active Problems Elevated liver enzymes -Likely in the setting of congestive hepatopathy due to heart failure. -LFTs continue to improve with diuresis  Acute kidney injury -Unclear baseline, his creatinine is improving with diuresis, continue Lasix and closely monitor  Leukocytosis/concern for pneumonia -No upper respiratory symptoms, afebrile, no productive cough.  Continue to monitor off antibiotics   Scheduled Meds: . furosemide  40 mg Intravenous  TID  . mouth rinse  15 mL Mouth Rinse BID   Continuous Infusions: PRN Meds:.traMADol  DVT prophylaxis: SCDs Code Status: Full code Family Communication: Wife and son present at bedside Disposition Plan: TBD  Consultants:   Cardiology   Procedures:   2D echo:  IMPRESSIONS  1. The left ventricle has a visually estimated ejection fraction of. The cavity size was severely dilated. Left ventricular diastolic Doppler parameters are consistent with restrictive filling Left ventrical global hypokinesis without regional wall  motion abnormalities.  2. Diffuse hypokinesis EF 15% Given severithy of LV dysfunction consider CHF team consult.  3. The right ventricle has normal systolic function. The cavity was normal. There is no increase in right ventricular wall thickness.  4. Left atrial size was moderately dilated.  5. The mitral valve is normal in structure. Mild thickening of the mitral valve leaflet. Mitral valve regurgitation is moderate by color flow Doppler.  6. The tricuspid valve is normal in structure.  7. The aortic valve is tricuspid Mild thickening of the aortic valve Aortic valve regurgitation is mild by color flow Doppler.  8. The pulmonic valve was grossly normal. Pulmonic valve regurgitation is mild by color flow Doppler.  Antimicrobials:  None    Objective: Vitals:   10/02/18 2108 10/03/18 0221 10/03/18 0500 10/03/18 0552  BP: (!) 118/97 (!) 127/99  (!) 126/98  Pulse: 94 99  95  Resp: (!) 24 (!) 22  (!) 24  Temp: 98.3 F (36.8 C) 97.7 F (36.5 C)  (!) 97.4 F (36.3 C)  TempSrc: Oral Oral  Oral  SpO2: 97% 98%  92%  Weight:   (!) 150.1 kg  Height:        Intake/Output Summary (Last 24 hours) at 10/03/2018 1018 Last data filed at 10/03/2018 [id number] Gross per 24 hour  Intake 1410 ml  Output 3200 ml  Net -1790 ml   Filed Weights   10/01/18 1703 10/02/18 0704 10/03/18 0500  Weight: (!) 151.1 kg (!) 151.4 kg (!) 150.1 kg    Examination:  Constitutional: No  distress Eyes: Scleral icterus improving ENMT: Moist mucous membranes Respiratory: Diminished at the bases but overall clear without wheezing or crackles Cardiovascular: Regular rate and rhythm, no murmurs appreciated.  2+ pitting lower extremity edema/anasarca Abdomen: Soft, nontender, nondistended, positive bowel sounds Musculoskeletal: No clubbing or cyanosis. Skin: No rashes seen Neurologic: No focal deficits, equal strength Psychiatric: Normal judgment and insight. Alert and oriented x 3.    Data Reviewed: I have independently reviewed following labs and imaging studies   CBC: Recent Labs  Lab 10/01/18 1336 10/02/18 0527  WBC 13.5* 12.7*  NEUTROABS 11.4*  --   HGB 15.1 15.2  HCT 49.3 52.2*  MCV 95.4 97.8  PLT 203 170   Basic Metabolic Panel: Recent Labs  Lab 10/01/18 1336 10/02/18 0527 10/03/18 0517  NA 136 138 139  K 4.7 4.4 4.0  CL 105 106 107  CO2 19* 21* 20*  GLUCOSE 136* 117* 118*  BUN 53* 52* 46*  CREATININE 1.44* 1.24 1.13  CALCIUM 8.6* 8.4* 8.1*   GFR: Estimated Creatinine Clearance: 118.5 mL/min (by C-G formula based on SCr of 1.13 mg/dL). Liver Function Tests: Recent Labs  Lab 10/01/18 1336 10/02/18 0527 10/03/18 0517  AST 110* 81* 57*  ALT 268* 219* 154*  ALKPHOS 153* 144* 138*  BILITOT 6.2* 5.2* 4.3*  PROT 6.4* 6.4* 5.8*  ALBUMIN 3.3* 3.2* 2.7*   Recent Labs  Lab 10/01/18 1336  LIPASE 46   Recent Labs  Lab 10/01/18 1337  AMMONIA 15   Coagulation Profile: Recent Labs  Lab 10/01/18 1353 10/02/18 0527  INR 1.4* 1.4*   Cardiac Enzymes: No results for input(s): CKTOTAL, CKMB, CKMBINDEX, TROPONINI in the last 168 hours. BNP (last 3 results) No results for input(s): PROBNP in the last 8760 hours. HbA1C: No results for input(s): HGBA1C in the last 72 hours. CBG: No results for input(s): GLUCAP in the last 168 hours. Lipid Profile: No results for input(s): CHOL, HDL, LDLCALC, TRIG, CHOLHDL, LDLDIRECT in the last 72  hours. Thyroid Function Tests: Recent Labs    10/01/18 1720  TSH 4.716*   Anemia Panel: No results for input(s): VITAMINB12, FOLATE, FERRITIN, TIBC, IRON, RETICCTPCT in the last 72 hours. Urine analysis:    Component Value Date/Time   COLORURINE AMBER (A) 10/01/2018 1523   APPEARANCEUR HAZY (A) 10/01/2018 1523   LABSPEC 1.023 10/01/2018 1523   PHURINE 5.0 10/01/2018 1523   GLUCOSEU NEGATIVE 10/01/2018 1523   HGBUR SMALL (A) 10/01/2018 1523   BILIRUBINUR NEGATIVE 10/01/2018 1523   KETONESUR NEGATIVE 10/01/2018 1523   PROTEINUR NEGATIVE 10/01/2018 1523   NITRITE NEGATIVE 10/01/2018 1523   LEUKOCYTESUR NEGATIVE 10/01/2018 1523   Sepsis Labs: Invalid input(s): PROCALCITONIN, LACTICIDVEN  No results found for this or any previous visit (from the past 240 hour(s)).    Radiology Studies: Ct Abdomen Pelvis Wo Contrast  Result Date: 10/01/2018 CLINICAL DATA:  Side abdominal pain, BILATERAL leg swelling for 3 weeks, question jaundice, weakness, shortness of breath with exertion EXAM: CT ABDOMEN AND PELVIS WITHOUT CONTRAST TECHNIQUE: Multidetector CT imaging of the abdomen and pelvis was performed following the standard protocol without  IV contrast. Sagittal and coronal MPR images reconstructed from axial data set. Oral contrast was not administered. COMPARISON:  None FINDINGS: Lower chest: Small BILATERAL pleural effusions. Subsegmental atelectasis LEFT lower lobe. Airspace infiltrates RIGHT lower lobe question pneumonia. Hepatobiliary: Gallbladder and liver normal appearance. No definite hepatic mass or nodularity Pancreas: Normal appearance Spleen: Normal appearance Adrenals/Urinary Tract: Adrenal glands normal appearance. LEFT kidney normal appearance without mass or hydronephrosis. Low-attenuation focus mid RIGHT kidney question small cyst approximately 16 x 15 mm diameter image 45. Additional large exophytic cyst at inferior pole RIGHT kidney 9.1 x 9.4 x 7.4 cm. Bladder grossly  unremarkable. Stomach/Bowel: Elongated sigmoid loop. Stomach and bowel loops otherwise normal appearance. Appendix not visualized. Vascular/Lymphatic: Atherosclerotic calcifications aorta and iliac arteries without aneurysm. Enlargement of cardiac chambers. No adenopathy. Reproductive: Mild prostatic enlargement gland 5.3 x 4.7 cm image 90. Other: Scattered subcutaneous and soft tissue edema throughout abdomen and pelvis into of inguinal regions. Mild presacral edema. Scattered fluid along the inferior aspects of the pararenal spaces in the upper pelvis. Minimal intraperitoneal fluid free fluid. No free air. No hernia. Musculoskeletal: Bones demineralized with scattered degenerative disc disease changes. IMPRESSION: Scattered soft tissue edema, minimal ascites and small BILATERAL pleural effusions, could be related to hypoproteinemia, heart failure, renal failure or anasarca. RIGHT lower lobe infiltrate consistent with pneumonia. Enlargement of cardiac chambers. Prostatic enlargement. Probable RIGHT renal cysts, small at mid kidney and 9.4 cm at inferior pole; these could be characterized by ultrasound. Electronically Signed   By: Ulyses Southward M.D.   On: 10/01/2018 14:54   Dg Chest Port 1 View  Result Date: 10/01/2018 CLINICAL DATA:  Patient c/o bilateral leg swelling worsening x3 weeks. Also c/o left side pain. Patient appears jaundiced. Reports weakness and SOB on exertion. Non smoker, tachypnea. EXAM: PORTABLE CHEST 1 VIEW COMPARISON:  None. FINDINGS: Cardiac silhouette is mildly enlarged. No mediastinal or hilar masses or convincing adenopathy. There is left greater than right lung base opacity consistent with atelectasis or infection. Remainder of the lungs is clear with no evidence of pulmonary edema. No visualized pleural effusion. No pneumothorax. Skeletal structures are grossly intact. IMPRESSION: 1. Left greater than right lung base opacity consistent with atelectasis, pneumonia or a combination. 2. No  evidence of pulmonary edema. 3. Mild cardiomegaly. Electronically Signed   By: Amie Portland M.D.   On: 10/01/2018 13:41   US Liver Doppler  Result Date: 10/01/2018 CLINICAL DATA:  Elevated liver function test. EXAM: DUPLEX ULTRASOUND OF LIVER TECHNIQUE: Color and duplex Doppler ultrasound was performed to evaluate the hepatic in-flow and out-flow vessels. COMPARISON:  None. FINDINGS: Liver: Mild generalized increased liver parenchymal echogenicity. Normal hepatic contour without nodularity. No focal lesion, mass or intrahepatic biliary ductal dilatation. Main Portal Vein size: 1.4 cm cm Portal Vein Velocities: All hepatopetal Main Prox:  9.2 cm/sec Main Mid: Not well visualized Main Dist:  9.1 cm/sec Right: 5.0 cm/sec Left: 5.8 cm/sec Hepatic Vein Velocities: All hepatofugal Right:  39 cm/sec Middle:  18.8 cm/sec Left:  19.7 cm/sec IVC: Present and patent with normal respiratory phasicity. Hepatic Artery Velocity:  55.5 cm/sec Splenic Vein Velocity:  Not visualized. Spleen: 6.2 cm x 9.4 cm x 3.5 cm with a total volume of 106 cm^3 (411 cm^3 is upper limit normal) Portal Vein Occlusion/Thrombus: No evidence of portal vein occlusion. No visualized thrombus Splenic Vein Occlusion/Thrombus: Not well visualized. Ascites: None Varices: None Study somewhat limited by the patient's body habitus and rapid respirations. IMPRESSION: 1. Overall diminished flow in the portal  vein with low velocities, but normal directional flow. 2. Splenic vein not visualized.  Spleen normal in size. 3. Patent hepatic veins with normal directional flow. Electronically Signed   By: Amie Portland M.D.   On: 10/01/2018 20:44   US Abdomen Limited Ruq  Result Date: 10/01/2018 CLINICAL DATA:  Elevated liver function test. EXAM: ULTRASOUND ABDOMEN LIMITED RIGHT UPPER QUADRANT COMPARISON:  None. FINDINGS: Gallbladder: Dependent mobile gallstones. No wall thickening or pericholecystic fluid. Common bile duct: Diameter: 5-6 mm Liver: No focal lesion  identified. Mild generalized increased parenchymal echogenicity. Portal vein is patent on color Doppler imaging with normal direction of blood flow towards the liver. Bilateral pleural effusions. IMPRESSION: 1. No acute findings. 2. Cholelithiasis without acute cholecystitis. No bile duct dilation. 3. Increased echogenicity of the liver suggests fatty infiltration. Electronically Signed   By: Amie Portland M.D.   On: 10/01/2018 20:39    Pamella Pert, MD, PhD [facility]  Contact via  [email or web address]  [facility] Office Info P: [phone]  F: [phone]

## 2018-10-03 NOTE — Progress Notes (Signed)
Telemetry monitor tech called to inform nurse that pt had several beats of v tach on the monitor.  Nurse checked on pt who was working with PT at the time.  Still had DOE during session, but not other c/o pain etc.  VSS normal for pt.  MD notified, will continue to monitor.

## 2018-10-03 NOTE — Progress Notes (Signed)
Notified on call [place] about pts 6 beat run of vtach, pt asymptomatic, BP 115/89, P- 87. Will continue to monitor.  Lenox Ponds, RN

## 2018-10-03 NOTE — Evaluation (Signed)
Physical Therapy Evaluation Patient Details Name: Aaron [name] MRN: [id number] DOB: 08/05/1965 Today's Date: 10/03/2018   History of Present Illness  Aaron [name] is a 53 y.o. 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  He was also found to have elevated liver enzymes as well as acute kidney injury New onset of anasacra and CHF with EF of 15%.   Clinical Impression  Pt very limited with ability today however tried very hard. Very little movement and at times no movement causes pt to have these bouts of dyspnea/SOB with decreased O2 sats occurring at the same time . O2 sats would fluctuate and dip down into 75% then back up to 93-96. The lows would last about 5-10 seconds then the recover in 10-15 seconds. ( even throughout the day as mentioned by pt and wife, cyclical)  Worked one educating and performing gentle BLE AAROM for exercises with reps of 5 then pursed lip breathing and then another rep of 5. Handout given to pt and wife. Also UE theraband on bed to encourage UE movement when feeling good as well.  SAt EOB, very limited with mobility due to very low tolerance ( breathing , SOB, dyspnea) of any activity and very weak all over. Will continue to follow for PT to help educate and continue to help strengthening patient within his physiological tolerance.   RECOMMEND Overbed trapeze to help pt be more independent with bed mobility.     Follow Up Recommendations SNF(pt/family wanting to DC home, depends on pt's progress here )    Equipment Recommendations       Recommendations for Other Services       Precautions / Restrictions Precautions Precaution Comments: EF 15%       Mobility  Bed Mobility Overal bed mobility: Needs Assistance Bed  Mobility: Rolling;Supine to Sit;Sit to Supine Rolling: Mod assist   Supine to sit: +2 for physical assistance;Mod assist Sit to supine: +2 for physical assistance;Mod assist   General bed mobility comments: wife helped assist. Pt had to go in sections, very slow due to the SOB , dyspnea with very little or no movement. Sat EOB for about 3 minutes ( a little side ways becaseu patient did not want to square up his hips at this point, very taxing for pt)   Transfers Overall transfer level: Independent(unable at this time )                  Ambulation/Gait                Stairs            Wheelchair Mobility    Modified Rankin (Stroke Patients Only)       Balance Overall balance assessment: Needs assistance Sitting-balance support: Bilateral upper extremity supported;Feet supported Sitting balance-Leahy Scale: Fair                                       Pertinent Vitals/Pain Pain Assessment: No/denies pain    Home Living Family/patient expects to be discharged to:: Private residence Living Arrangements: Spouse/significant other;Children Available Help at Discharge: Family;Available PRN/intermittently(family is going to try to be with him 24/ 7  if possible) Type of Home: House Home Access: Stairs to enter Entrance Stairs-Rails: Right Entrance Stairs-Number of Steps: 5 Home Layout: Multi-level Home Equipment: Walker - 2 wheels;Bedside commode      Prior Function Level of Independence: Independent         Comments: up until 3-4 weeks ago pt was independent, however within last 3-4 weeks everything became a lot harder, SOB, difficulty moving and this last week could not make it up and down his own steps in his house     Hand Dominance        Extremity/Trunk Assessment   Upper Extremity Assessment Upper Extremity Assessment: Generalized weakness;Defer to OT evaluation    Lower Extremity Assessment Lower Extremity Assessment:  Generalized weakness(all with normal ROM, however VERY weak both LE grossly 3-/5 for all movement. Unable to move independently against gravity )       Communication   Communication: No difficulties  Cognition Arousal/Alertness: Awake/alert Behavior During Therapy: WFL for tasks assessed/performed Overall Cognitive Status: Within Functional Limits for tasks assessed                                        General Comments      Exercises General Exercises - Lower Extremity Ankle Circles/Pumps: AROM;10 reps;Both;Supine Quad Sets: AROM;Both;10 reps(with glut set as well ) Gluteal Sets: AROM;10 reps;Both(performed with quad set ) Heel Slides: AAROM;10 reps;Both;Supine Hip ABduction/ADduction: AAROM;10 reps;Supine;Both Straight Leg Raises: AAROM;10 reps;Both;Supine   Assessment/Plan    PT Assessment Patient needs continued PT services  PT Problem List Decreased strength;Decreased mobility;Decreased activity tolerance;Cardiopulmonary status limiting activity       PT Treatment Interventions Gait training;Functional mobility training;Therapeutic activities;Therapeutic exercise;Patient/family education;DME instruction    PT Goals (Current goals can be found in the Care Plan section)  Acute Rehab PT Goals Patient Stated Goal: I want to be able to go home and move better PT Goal Formulation: With patient/family Time For Goal Achievement: 10/17/18 Potential to Achieve Goals: Fair    Frequency Min 3X/week(would beenfit from more fewq and small sessions for tolerance )   Barriers to discharge Inaccessible home environment pt has steps to enter the home and steps inside the home ( split levels )     Co-evaluation               AM-PAC PT "6 Clicks" Mobility  Outcome Measure Help needed turning from your back to your side while in a flat bed without using bedrails?: A Lot Help needed moving from lying on your back to sitting on the side of a flat bed without  using bedrails?: A Lot Help needed moving to and from a bed to a chair (including a wheelchair)?: Total Help needed standing up from a chair using your arms (e.g., wheelchair or bedside chair)?: Total Help needed to walk in hospital room?: Total Help needed climbing 3-5 steps with a railing? : Total 6 Click Score: 8    End of Session   Activity Tolerance: Treatment limited secondary to medical complications (Comment) Patient left: in bed;with call bell/phone within reach;with family/visitor present Nurse Communication: Mobility status PT Visit Diagnosis: Muscle weakness (generalized) (M62.81)    Time: 3007-6226 PT Time Calculation (min) (ACUTE ONLY): 55 min   Charges:   PT Evaluation $PT Eval Moderate Complexity: 1 Mod PT Treatments $Therapeutic Exercise: 23-37 mins $Therapeutic Activity: 8-22 mins        Clois Dupes  Moshe Cipro, PT Acute Rehabilitation Services Pager: [phone] Office: [phone] 10/03/2018   Marella Bile 10/03/2018, 1:07 PM

## 2018-10-04 LAB — BASIC METABOLIC PANEL
Anion gap: 6 (ref 5–15)
BUN: 38 mg/dL — ABNORMAL HIGH (ref 6–20)
CALCIUM: 8 mg/dL — AB (ref 8.9–10.3)
CO2: 26 mmol/L (ref 22–32)
Chloride: 108 mmol/L (ref 98–111)
Creatinine, Ser: 0.95 mg/dL (ref 0.61–1.24)
GFR calc Af Amer: >60 mL/min (ref >60)
GFR calc non Af Amer: >60 mL/min (ref >60)
Glucose, Bld: 136 mg/dL — ABNORMAL HIGH (ref 70–99)
Potassium: 3.4 mmol/L — ABNORMAL LOW (ref 3.5–5.1)
SODIUM: 140 mmol/L (ref 135–145)

## 2018-10-04 MED ORDER — CARVEDILOL 3.125 MG PO TABS
3.1250 mg | ORAL_TABLET | Freq: Two times a day (BID) | ORAL | Status: DC
  Administered 2018-10-04 – 2018-10-07 (×6): 3.125 mg via ORAL
  Filled 2018-10-04 (×6): qty 1

## 2018-10-04 MED ORDER — NICOTINE 21 MG/24HR TD PT24
21.0000 mg | MEDICATED_PATCH | Freq: Every day | TRANSDERMAL | Status: DC
  Administered 2018-10-04 – 2018-10-29 (×26): 21 mg via TRANSDERMAL
  Filled 2018-10-04 (×26): qty 1

## 2018-10-04 MED ORDER — POTASSIUM CHLORIDE CRYS ER 20 MEQ PO TBCR
40.0000 meq | EXTENDED_RELEASE_TABLET | Freq: Two times a day (BID) | ORAL | Status: AC
  Administered 2018-10-04 (×2): 40 meq via ORAL
  Filled 2018-10-04 (×2): qty 2

## 2018-10-04 NOTE — Progress Notes (Signed)
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 Elvera [name]  Subjective   Diuresed another 3.3L overnight- now out 6.1L. Weight down from 150 kg to 146 kg.  Creatinine improving with diuresis - now 0.95. Potassium is low today at 3.4 and will be repleted.   Objective   Vitals:   10/03/18 2128 10/04/18 0500 10/04/18 0525 10/04/18 1001  BP: 115/89  108/85 104/75  Pulse: 87  99 94  Resp: 20  20 (!) 22  Temp: 98.1 F (36.7 C)  (!) 97.5 F (36.4 C) 98.6 F (37 C)  TempSrc: Oral  Oral Oral  SpO2: 90%  (!) 88% 97%  Weight:  (!) 146.2 kg    Height:        Intake/Output Summary (Last 24 hours) at 10/04/2018 1111 Last data filed at 10/04/2018 3662 Gross per 24 hour  Intake 1180 ml  Output 4600 ml  Net -3420 ml   Filed Weights   10/02/18 0704 10/03/18 0500 10/04/18 0500  Weight: (!) 151.4 kg (!) 150.1 kg (!) 146.2 kg    Physical Exam   General appearance: alert, mild distress, morbidly obese and pale Neck: JVD - 7 cm above sternal notch, no carotid bruit and thyroid not enlarged, symmetric, no tenderness/mass/nodules Lungs: diminished breath sounds bibasilar Heart: regular rate and rhythm, S1, S2 normal, S3 present and systolic murmur: early systolic 2/6, crescendo at lower left sternal border Abdomen: soft, non-tender; bowel sounds normal; no masses,  no organomegaly and obese, +fluid wave Extremities: edema 3+ edema Pulses: 2+ and symmetric Skin: pale, cool, dry Neurologic: Mental status: Alert, oriented, thought content appropriate Psych: Pleasant  Inpatient Medications    Scheduled Meds: . furosemide  40 mg Intravenous TID  . mouth rinse  15 mL Mouth Rinse BID  . potassium chloride  40 mEq Oral BID      Continuous Infusions:   PRN Meds: traMADol   Labs   Results for orders placed or performed during the hospital encounter of 10/01/18 (from the past 48 hour(s))  Comprehensive metabolic panel     Status: Abnormal   Collection Time: 10/03/18  5:17 AM  Result Value Ref Range   Sodium 139 135 - 145 mmol/L   Potassium 4.0 3.5 - 5.1 mmol/L   Chloride 107 98 - 111 mmol/L   CO2 20 (L) 22 - 32 mmol/L   Glucose, Bld 118 (H) 70 - 99 mg/dL   BUN 46 (H) 6 - 20 mg/dL   Creatinine, Ser 9.47 0.61 - 1.24 mg/dL   Calcium 8.1 (L) 8.9 - 10.3 mg/dL   Total Protein 5.8 (L) 6.5 - 8.1 g/dL   Albumin 2.7 (L) 3.5 - 5.0 g/dL   AST 57 (H) 15 - 41 U/L   ALT 154 (H) 0 - 44 U/L   Alkaline Phosphatase 138 (H) 38 - 126 U/L   Total Bilirubin 4.3 (H) 0.3 - 1.2 mg/dL   GFR calc non Af Amer >60 >60 mL/min   GFR calc Af Amer >60 >60 mL/min   Anion gap 12 5 - 15    Comment: Performed at [facility], [address], [place], [place] [address]  Basic metabolic panel     Status:  Abnormal   Collection Time: 10/04/18  5:35 AM  Result Value Ref Range   Sodium 140 135 - 145 mmol/L   Potassium 3.4 (L) 3.5 - 5.1 mmol/L   Chloride 108 98 - 111 mmol/L   CO2 26 22 - 32 mmol/L   Glucose, Bld 136 (H) 70 - 99 mg/dL   BUN 38 (H) 6 - 20 mg/dL   Creatinine, Ser 8.93 0.61 - 1.24 mg/dL   Calcium 8.0 (L) 8.9 - 10.3 mg/dL   GFR calc non Af Amer >60 >60 mL/min   GFR calc Af Amer >60 >60 mL/min   Anion gap 6 5 - 15    Comment: Performed at [facility], [address], [place], [place] [address]    ECG   Sinus rhythm with IVCD (left bundle pattern)- Personally Reviewed  Telemetry   Sinus rhythm with PVC's - Personally Reviewed  Radiology    No results found.  Cardiac Studies   TTE:   1. The left ventricle has a visually estimated ejection fraction of. The cavity size was severely dilated. Left ventricular diastolic Doppler parameters are consistent with restrictive  filling Left ventrical global hypokinesis without regional wall  motion abnormalities.  2. Diffuse hypokinesis EF 15% Given severithy of LV dysfunction consider CHF team consult.  3. The right ventricle has normal systolic function. The cavity was normal. There is no increase in right ventricular wall thickness.  4. Left atrial size was moderately dilated.  5. The mitral valve is normal in structure. Mild thickening of the mitral valve leaflet. Mitral valve regurgitation is moderate by color flow Doppler.  6. The tricuspid valve is normal in structure.  7. The aortic valve is tricuspid Mild thickening of the aortic valve Aortic valve regurgitation is mild by color flow Doppler.  8. The pulmonic valve was grossly normal. Pulmonic valve regurgitation is mild by color flow Doppler.  Assessment   Principal Problem:   Acute systolic CHF (congestive heart failure) (HCC) Active Problems:   Anasarca   Elevated LFTs   AKI (acute kidney injury) (HCC)   Acute hypoxemic respiratory failure (HCC)   Plan   1. [name] is diuresing well with improving renal function. Continue IV diuresis today - will need further work-up as to the etiology of heart failure some time later next week, when he is more compensated.  Time Spent Directly with Patient:  I have spent a total of 25 minutes with the patient reviewing hospital notes, telemetry, EKGs, labs and examining the patient as well as establishing an assessment and plan that was discussed personally with the patient.  > 50% of time was spent in direct patient care.  Length of Stay:  LOS: 3 days   Aaron Nose, [name], [facility], FACP  [place]  [facility]  Medical Director of the [facility]  [facility] Diplomate of the American Board of Clinical Lipidology Attending Cardiologist  Direct Dial: [phone]  Fax: [phone]  Website:  [name] Aaron [name] 10/04/2018, 11:11 AM

## 2018-10-04 NOTE — Progress Notes (Signed)
PROGRESS NOTE  Aaron [name] NLG:[id number] DOB: 12-Aug-1965 DOA: 10/01/2018 PCP: [facility] [place]   LOS: 3 days   Brief Narrative / Interim history: Aaron [name] is a 53 y.o. 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  He was also found to have elevated liver enzymes as well as acute kidney injury  Subjective: -Continues to improve, down about 8 pounds.  No chest pain, no shortness of breath.  Assessment & Plan: Principal Problem:   Acute systolic CHF (congestive heart failure) (HCC) Active Problems:   Anasarca   Elevated LFTs   AKI (acute kidney injury) (HCC)   Acute hypoxemic respiratory failure (HCC)   Principal Problem Acute hypoxic respiratory failure due to acute systolic CHF with anasarca -Patient's respiratory status improving with diuresis, continue Lasix, currently 3 times daily -2D echo done on 10/02/2018 showed an severely depressed EF of 15% which is new for the patient.  Cardiology was consulted and are following, for now optimize fluid status and will need cardiac catheterization at [facility] hopefully next week -Net negative about 6 L and down 8 pounds -NSVT, seen on telemetry, likely due to EF 15%.  Cardiology following  Active Problems Elevated liver enzymes -Likely in the setting of congestive hepatopathy due to heart failure. -LFTs continue to improve with diuresis  Hypokalemia -Due to diuresis, replete  Acute kidney injury -Unclear baseline, his creatinine is improving with diuresis, continue Lasix and closely monitor  Leukocytosis/concern for pneumonia -No upper respiratory symptoms, afebrile, no productive cough.  Continue to monitor off antibiotics   Scheduled Meds: . furosemide  40 mg  Intravenous TID  . mouth rinse  15 mL Mouth Rinse BID  . potassium chloride  40 mEq Oral BID   Continuous Infusions: PRN Meds:.traMADol  DVT prophylaxis: SCDs Code Status: Full code Family Communication: Wife and son present at bedside Disposition Plan: TBD  Consultants:   Cardiology   Procedures:   2D echo:  IMPRESSIONS  1. The left ventricle has a visually estimated ejection fraction of. The cavity size was severely dilated. Left ventricular diastolic Doppler parameters are consistent with restrictive filling Left ventrical global hypokinesis without regional wall  motion abnormalities.  2. Diffuse hypokinesis EF 15% Given severithy of LV dysfunction consider CHF team consult.  3. The right ventricle has normal systolic function. The cavity was normal. There is no increase in right ventricular wall thickness.  4. Left atrial size was moderately dilated.  5. The mitral valve is normal in structure. Mild thickening of the mitral valve leaflet. Mitral valve regurgitation is moderate by color flow Doppler.  6. The tricuspid valve is normal in structure.  7. The aortic valve is tricuspid Mild thickening of the aortic valve Aortic valve regurgitation is mild by color flow Doppler.  8. The pulmonic valve was grossly normal. Pulmonic valve regurgitation is mild by color flow Doppler.  Antimicrobials:  None    Objective: Vitals:   10/03/18 2128 10/04/18 0500 10/04/18 0525 10/04/18 1001  BP: 115/89  108/85 104/75  Pulse: 87  99 94  Resp: 20  20 (!) 22  Temp: 98.1 F (36.7 C)  (!) 97.5 F (36.4 C) 98.6 F (37 C)  TempSrc: Oral  Oral Oral  SpO2: 90%  (!) 88% 97%  Weight:  (!) 146.2 kg    Height:        Intake/Output Summary (Last 24 hours) at 10/04/2018 1019 Last data filed at 10/04/2018 5726 Gross per 24 hour  Intake 1180 ml  Output 4600 ml  Net -3420 ml   Filed Weights   10/02/18 0704 10/03/18 0500 10/04/18 0500  Weight: (!) 151.4 kg (!) 150.1 kg (!) 146.2 kg     Examination:  Constitutional: No distress Eyes: Scleral icterus improving ENMT: Moist mucous membranes Respiratory: Clear to auscultation without wheezing or crackles.  Increased respiratory effort and slightly tachypneic Cardiovascular: Regular rate and rhythm, no murmurs appreciated.  2+ pitting lower extremity edema, improving Abdomen: Soft, nontender, nondistended, positive bowel sounds Musculoskeletal: No clubbing or cyanosis. Skin: No rashes seen Neurologic: No focal deficits Psychiatric: Normal judgment and insight. Alert and oriented x 3.    Data Reviewed: I have independently reviewed following labs and imaging studies   CBC: Recent Labs  Lab 10/01/18 1336 10/02/18 0527  WBC 13.5* 12.7*  NEUTROABS 11.4*  --   HGB 15.1 15.2  HCT 49.3 52.2*  MCV 95.4 97.8  PLT 203 170   Basic Metabolic Panel: Recent Labs  Lab 10/01/18 1336 10/02/18 0527 10/03/18 0517 10/04/18 0535  NA 136 138 139 140  K 4.7 4.4 4.0 3.4*  CL 105 106 107 108  CO2 19* 21* 20* 26  GLUCOSE 136* 117* 118* 136*  BUN 53* 52* 46* 38*  CREATININE 1.44* 1.24 1.13 0.95  CALCIUM 8.6* 8.4* 8.1* 8.0*   GFR: Estimated Creatinine Clearance: 139.1 mL/min (by C-G formula based on SCr of 0.95 mg/dL). Liver Function Tests: Recent Labs  Lab 10/01/18 1336 10/02/18 0527 10/03/18 0517  AST 110* 81* 57*  ALT 268* 219* 154*  ALKPHOS 153* 144* 138*  BILITOT 6.2* 5.2* 4.3*  PROT 6.4* 6.4* 5.8*  ALBUMIN 3.3* 3.2* 2.7*   Recent Labs  Lab 10/01/18 1336  LIPASE 46   Recent Labs  Lab 10/01/18 1337  AMMONIA 15   Coagulation Profile: Recent Labs  Lab 10/01/18 1353 10/02/18 0527  INR 1.4* 1.4*   Cardiac Enzymes: No results for input(s): CKTOTAL, CKMB, CKMBINDEX, TROPONINI in the last 168 hours. BNP (last 3 results) No results for input(s): PROBNP in the last 8760 hours. HbA1C: No results for input(s): HGBA1C in the last 72 hours. CBG: No results for input(s): GLUCAP in the last 168  hours. Lipid Profile: No results for input(s): CHOL, HDL, LDLCALC, TRIG, CHOLHDL, LDLDIRECT in the last 72 hours. Thyroid Function Tests: Recent Labs    10/01/18 1720  TSH 4.716*   Anemia Panel: No results for input(s): VITAMINB12, FOLATE, FERRITIN, TIBC, IRON, RETICCTPCT in the last 72 hours. Urine analysis:    Component Value Date/Time   COLORURINE AMBER (A) 10/01/2018 1523   APPEARANCEUR HAZY (A) 10/01/2018 1523   LABSPEC 1.023 10/01/2018 1523   PHURINE 5.0 10/01/2018 1523   GLUCOSEU NEGATIVE 10/01/2018 1523   HGBUR SMALL (A) 10/01/2018 1523   BILIRUBINUR NEGATIVE 10/01/2018 1523   KETONESUR NEGATIVE 10/01/2018 1523   PROTEINUR NEGATIVE 10/01/2018 1523   NITRITE NEGATIVE 10/01/2018 1523   LEUKOCYTESUR NEGATIVE 10/01/2018 1523   Sepsis Labs: Invalid input(s): PROCALCITONIN, LACTICIDVEN  No results found for this or any previous visit (from the past 240 hour(s)).    Radiology Studies: No results found.  Pamella Pert, MD, PhD [facility]  Contact via  [email or web address]  [facility] Office Info P: [phone]  F: [phone]

## 2018-10-04 NOTE — Progress Notes (Signed)
Pt with 5-bts run of Vtach, non-sustained. Pt resting quietly in bed with no symptoms/complaints. Vitals wnl. Dr. Elvera Lennox made aware.  No new orders given. VWilliams,RN.

## 2018-10-05 LAB — BASIC METABOLIC PANEL
Anion gap: 9 (ref 5–15)
BUN: 29 mg/dL — ABNORMAL HIGH (ref 6–20)
CO2: 29 mmol/L (ref 22–32)
Calcium: 8 mg/dL — ABNORMAL LOW (ref 8.9–10.3)
Chloride: 107 mmol/L (ref 98–111)
Creatinine, Ser: 0.72 mg/dL (ref 0.61–1.24)
GFR calc non Af Amer: >60 mL/min (ref >60)
Glucose, Bld: 115 mg/dL — ABNORMAL HIGH (ref 70–99)
Potassium: 3.6 mmol/L (ref 3.5–5.1)
Sodium: 145 mmol/L (ref 135–145)

## 2018-10-05 LAB — T4, FREE: Free T4: 0.99 ng/dL (ref 0.82–1.77)

## 2018-10-05 LAB — MAGNESIUM: MAGNESIUM: 2.1 mg/dL (ref 1.7–2.4)

## 2018-10-05 MED ORDER — POTASSIUM CHLORIDE CRYS ER 20 MEQ PO TBCR
40.0000 meq | EXTENDED_RELEASE_TABLET | Freq: Once | ORAL | Status: AC
  Administered 2018-10-05: 40 meq via ORAL
  Filled 2018-10-05: qty 2

## 2018-10-05 NOTE — Progress Notes (Signed)
PROGRESS NOTE  Aaron [name] RCV:[id number] DOB: [birth year] DOA: 10/01/2018 PCP: [facility] [place]   LOS: 4 days   Brief Narrative / Interim history: Aaron [name] is a 53 y.o. 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  He was also found to have elevated liver enzymes as well as acute kidney injury  Subjective: -Shortness of breath is improving, denies any chest pain, no abdominal pain, no nausea or vomiting..  Assessment & Plan: Principal Problem:   Acute systolic CHF (congestive heart failure) (HCC) Active Problems:   Anasarca   Elevated LFTs   AKI (acute kidney injury) (HCC)   Acute hypoxemic respiratory failure (HCC)   Principal Problem Acute hypoxic respiratory failure due to acute systolic CHF with anasarca -Patient's respiratory status improving with diuresis, continue Lasix, currently 3 times daily -2D echo done on 10/02/2018 showed an severely depressed EF of 15% which is new for the patient.  Cardiology was consulted and are following, for now optimize fluid status and will need cardiac catheterization at [facility] hopefully next week -Net -8 L, from 333 pounds on admission to 317 today -Renal function stable, continue diuresis  Intermittent VT/SVT -More so yesterday afternoon, discussed with Dr. Rennis Golden, started low-dose Coreg. Telemetry looks improved  Active Problems Elevated liver enzymes -Likely in the setting of congestive hepatopathy due to heart failure. -LFTs continue to improve with diuresis  Hypokalemia -Potassium 3.6 this morning, continue to replete with a goal of 4.0  Acute kidney injury -Resolved, creatinine now within normal limits, monitor while on diuresis  Leukocytosis/concern for  pneumonia -No upper respiratory symptoms, afebrile, no productive cough.  Continue to monitor off antibiotics   Scheduled Meds: . carvedilol  3.125 mg Oral BID WC  . furosemide  40 mg Intravenous TID  . mouth rinse  15 mL Mouth Rinse BID  . nicotine  21 mg Transdermal Daily   Continuous Infusions: PRN Meds:.traMADol  DVT prophylaxis: SCDs Code Status: Full code Family Communication: Wife and son present at bedside Disposition Plan: TBD  Consultants:   Cardiology   Procedures:   2D echo:  IMPRESSIONS  1. The left ventricle has a visually estimated ejection fraction of. The cavity size was severely dilated. Left ventricular diastolic Doppler parameters are consistent with restrictive filling Left ventrical global hypokinesis without regional wall  motion abnormalities.  2. Diffuse hypokinesis EF 15% Given severithy of LV dysfunction consider CHF team consult.  3. The right ventricle has normal systolic function. The cavity was normal. There is no increase in right ventricular wall thickness.  4. Left atrial size was moderately dilated.  5. The mitral valve is normal in structure. Mild thickening of the mitral valve leaflet. Mitral valve regurgitation is moderate by color flow Doppler.  6. The tricuspid valve is normal in structure.  7. The aortic valve is tricuspid Mild thickening of the aortic valve Aortic valve regurgitation is mild by color flow Doppler.  8. The pulmonic valve was grossly normal. Pulmonic valve regurgitation is mild by color flow Doppler.  Antimicrobials:  None    Objective: Vitals:   10/04/18 1228 10/04/18 1410 10/04/18 2054 10/05/18 0500  BP: 115/81 (!) 109/94 104/76 106/70  Pulse: 95 94 87 87  Resp: (!) 24 (!) 22 (!) 21 [name])  21  Temp: 97.8 F (36.6 C) 98.2 F (36.8 C) 98 F (36.7 C) 97.9 F (36.6 C)  TempSrc: Oral Oral Oral Oral  SpO2: 98% 97% 97% 99%  Weight:    (!) 144 kg  Height:        Intake/Output Summary (Last 24 hours) at 10/05/2018  1028 Last data filed at 10/05/2018 0800 Gross per 24 hour  Intake 1317 ml  Output 3400 ml  Net -2083 ml   Filed Weights   10/03/18 0500 10/04/18 0500 10/05/18 0500  Weight: (!) 150.1 kg (!) 146.2 kg (!) 144 kg    Examination:  Constitutional: NAD Eyes: No scleral icterus ENMT: Moist mucous membranes Respiratory: Diminished sounds at the bases but overall clear no wheezing or crackles heard.  Tachypneic but improved Cardiovascular: Regular rate and rhythm, no murmurs appreciated.  2+ pitting lower extremity edema/anasarca Abdomen: Soft, nontender, nondistended, positive bowel sounds Musculoskeletal: No clubbing or cyanosis. Skin: No rashes seen Neurologic: Nonfocal, equal strength Psychiatric: Normal judgment and insight. Alert and oriented x 3.    Data Reviewed: I have independently reviewed following labs and imaging studies   CBC: Recent Labs  Lab 10/01/18 1336 10/02/18 0527  WBC 13.5* 12.7*  NEUTROABS 11.4*  --   HGB 15.1 15.2  HCT 49.3 52.2*  MCV 95.4 97.8  PLT 203 170   Basic Metabolic Panel: Recent Labs  Lab 10/01/18 1336 10/02/18 0527 10/03/18 0517 10/04/18 0535 10/05/18 0448  NA 136 138 139 140 145  K 4.7 4.4 4.0 3.4* 3.6  CL 105 106 107 108 107  CO2 19* 21* 20* 26 29  GLUCOSE 136* 117* 118* 136* 115*  BUN 53* 52* 46* 38* 29*  CREATININE 1.44* 1.24 1.13 0.95 0.72  CALCIUM 8.6* 8.4* 8.1* 8.0* 8.0*   GFR: Estimated Creatinine Clearance: 163.8 mL/min (by C-G formula based on SCr of 0.72 mg/dL). Liver Function Tests: Recent Labs  Lab 10/01/18 1336 10/02/18 0527 10/03/18 0517  AST 110* 81* 57*  ALT 268* 219* 154*  ALKPHOS 153* 144* 138*  BILITOT 6.2* 5.2* 4.3*  PROT 6.4* 6.4* 5.8*  ALBUMIN 3.3* 3.2* 2.7*   Recent Labs  Lab 10/01/18 1336  LIPASE 46   Recent Labs  Lab 10/01/18 1337  AMMONIA 15   Coagulation Profile: Recent Labs  Lab 10/01/18 1353 10/02/18 0527  INR 1.4* 1.4*   Cardiac Enzymes: No results for input(s): CKTOTAL,  CKMB, CKMBINDEX, TROPONINI in the last 168 hours. BNP (last 3 results) No results for input(s): PROBNP in the last 8760 hours. HbA1C: No results for input(s): HGBA1C in the last 72 hours. CBG: No results for input(s): GLUCAP in the last 168 hours. Lipid Profile: No results for input(s): CHOL, HDL, LDLCALC, TRIG, CHOLHDL, LDLDIRECT in the last 72 hours. Thyroid Function Tests: No results for input(s): TSH, T4TOTAL, FREET4, T3FREE, THYROIDAB in the last 72 hours. Anemia Panel: No results for input(s): VITAMINB12, FOLATE, FERRITIN, TIBC, IRON, RETICCTPCT in the last 72 hours. Urine analysis:    Component Value Date/Time   COLORURINE AMBER (A) 10/01/2018 1523   APPEARANCEUR HAZY (A) 10/01/2018 1523   LABSPEC 1.023 10/01/2018 1523   PHURINE 5.0 10/01/2018 1523   GLUCOSEU NEGATIVE 10/01/2018 1523   HGBUR SMALL (A) 10/01/2018 1523   BILIRUBINUR NEGATIVE 10/01/2018 1523   KETONESUR NEGATIVE 10/01/2018 1523   PROTEINUR NEGATIVE 10/01/2018 1523   NITRITE NEGATIVE 10/01/2018 1523   LEUKOCYTESUR NEGATIVE 10/01/2018 1523   Sepsis Labs: Invalid input(s): PROCALCITONIN, LACTICIDVEN  No results found for this or  any previous visit (from the past 240 hour(s)).    Radiology Studies: No results found.  Pamella Pert, MD, PhD [facility]  Contact via  [email or web address]  [facility] Office Info P: [phone]  F: [phone]

## 2018-10-05 NOTE — Progress Notes (Addendum)
Progress Note  Patient Name: Aaron [name] Date of Encounter: 10/05/2018  Primary Cardiologist: Jodelle Red, MD   Subjective   No significant overnight events. Breathing and lower extremity edema improving. Able to lay more flat at night but still sleeping on a slight incline. No chest pain, palpitations, lightheadedness, or dizziness.  Inpatient Medications    Scheduled Meds: . carvedilol  3.125 mg Oral BID WC  . furosemide  40 mg Intravenous TID  . mouth rinse  15 mL Mouth Rinse BID  . nicotine  21 mg Transdermal Daily  . potassium chloride  40 mEq Oral Once   Continuous Infusions:  PRN Meds: traMADol   Vital Signs    Vitals:   10/04/18 1228 10/04/18 1410 10/04/18 2054 10/05/18 0500  BP: 115/81 (!) 109/94 104/76 106/70  Pulse: 95 94 87 87  Resp: (!) 24 (!) 22 (!) 21 (!) 21  Temp: 97.8 F (36.6 C) 98.2 F (36.8 C) 98 F (36.7 C) 97.9 F (36.6 C)  TempSrc: Oral Oral Oral Oral  SpO2: 98% 97% 97% 99%  Weight:    (!) 144 kg  Height:        Intake/Output Summary (Last 24 hours) at 10/05/2018 1109 Last data filed at 10/05/2018 0800 Gross per 24 hour  Intake 1317 ml  Output 3400 ml  Net -2083 ml   Filed Weights   10/03/18 0500 10/04/18 0500 10/05/18 0500  Weight: (!) 150.1 kg (!) 146.2 kg (!) 144 kg    Telemetry    Sinus rhythm with heart rates in the 80's and frequent PVCs. A few runs of non-sustained VT, with the longest one being 21 beats, also noted. - Personally Reviewed  ECG    No new ECG tracing today. - Personally Reviewed  Physical Exam   GEN: Caucasian male resting comfortably. Alert and in no acute distress.   Neck: Supple. JVD elevated. Cardiac: RRR. No significant murmurs, gallops, or rubs. Radial pulses 2+ and equal bilaterally.  Respiratory: No significant increased work of breathing. Diminished breath sounds and crackles noted in bilateral bases. GI: Abdomen soft, obese/mildly distended, and non-tender. Bowel sounds present. MS:  3+ pitting edema of bilateral lower extremities. No deformity. Skin: Warm and dry. Neuro:  No focal deficits. Psych: Normal affect. Responds appropriately.  Labs    Chemistry Recent Labs  Lab 10/01/18 1336 10/02/18 0527 10/03/18 0517 10/04/18 0535 10/05/18 0448  NA 136 138 139 140 145  K 4.7 4.4 4.0 3.4* 3.6  CL 105 106 107 108 107  CO2 19* 21* 20* 26 29  GLUCOSE 136* 117* 118* 136* 115*  BUN 53* 52* 46* 38* 29*  CREATININE 1.44* 1.24 1.13 0.95 0.72  CALCIUM 8.6* 8.4* 8.1* 8.0* 8.0*  PROT 6.4* 6.4* 5.8*  --   --   ALBUMIN 3.3* 3.2* 2.7*  --   --   AST 110* 81* 57*  --   --   ALT 268* 219* 154*  --   --   ALKPHOS 153* 144* 138*  --   --   BILITOT 6.2* 5.2* 4.3*  --   --   GFRNONAA 55* >60 >60 >60 >60  GFRAA >60 >60 >60 >60 >60  ANIONGAP 12 11 12 6 9      Hematology Recent Labs  Lab 10/01/18 1336 10/02/18 0527  WBC 13.5* 12.7*  RBC 5.17 5.34  HGB 15.1 15.2  HCT 49.3 52.2*  MCV 95.4 97.8  MCH 29.2 28.5  MCHC 30.6 29.1*  RDW 17.4* 17.6*  PLT 203 170    Cardiac EnzymesNo results for input(s): TROPONINI in the last 168 hours. No results for input(s): TROPIPOC in the last 168 hours.   BNP Recent Labs  Lab 10/01/18 1337 10/01/18 1720  BNP 2,769.2* 2,333.1*     DDimer No results for input(s): DDIMER in the last 168 hours.   Radiology    No results found.  Cardiac Studies   Echocardiogram 10/02/2018: Impression: 1. The left ventricle has a visually estimated ejection fraction of. The cavity size was severely dilated. Left ventricular diastolic Doppler parameters are consistent with restrictive filling Left ventrical global hypokinesis without regional wall  motion abnormalities.  2. Diffuse hypokinesis EF 15% Given severithy of LV dysfunction consider CHF team consult.  3. The right ventricle has normal systolic function. The cavity was normal. There is no increase in right ventricular wall thickness.  4. Left atrial size was moderately dilated.  5. The  mitral valve is normal in structure. Mild thickening of the mitral valve leaflet. Mitral valve regurgitation is moderate by color flow Doppler.  6. The tricuspid valve is normal in structure.  7. The aortic valve is tricuspid Mild thickening of the aortic valve Aortic valve regurgitation is mild by color flow Doppler.  8. The pulmonic valve was grossly normal. Pulmonic valve regurgitation is mild by color flow Doppler.  Patient Profile   Aaron [name] is a 53 y.o. male with a history of obesity who was admitted on 10/01/2018 for bilateral lower extremity edema. Cardiology was consulted for acute CHF.  Assessment & Plan    Acute Systolic CHF - Echo showed LVEF of 15% with global hypokinesis but no regional wall motion abnormalities.  - BNP 2, 769.2 on admission. - Diuresing well on IV Lasix. Documented output of 3.4 L in the past 24 hours with net negative 8 L since admission. Weight 317 lbs today, down from 333 on admission. Renal function stable. - Continue IV Lasix 40mg  three times daily. - Continue Coreg 3.125mg  twice daily. Up-titrate as able. - Recommend starting Entresto when able from BP standpoint.  - Continue to monitor daily weight, strict I/O's, and renal function.  - Will ultimately need right/left heart catheterization this admission.  Suspect he will need a day or two more of diuresis  Non-Sustained VT - A few short episodes of non-sustained VT noted on telemetry with longest being 21 beats. Patient asymptomatic with this. - Potassium 3.6. Goal >4.0. Replete as needed.  - Will check Magnesium.  - TSH minimally elevated at 4.716. Will check free T4.  - Continue Coreg as above. Maximize dose as able. - Will discuss possible Life Vest prior to discharge with MD.  For questions or updates, please contact [facility] Please consult [email or web address] for contact info under Cardiology/STEMI.      Signed, Corrin Parker, PA-C  10/05/2018, 11:09 AM     ---------------------------------------------------------------------------------------------   History and all data above reviewed.  Patient examined.  I agree with the findings as above.  Aaron [name] is a pleasant 53 year old gentleman admitted for acute decompensated heart failure.  He notes a sense of epigastric pain which he thought was IBS symptoms several weeks ago, and he was taking an over-the-counter IBS supplement.  He began to notice increasing swelling leading to his presentation to the hospital.  He has a severely reduced ejection fraction of 15% which will require further investigation.  Constitutional: No acute distress Cardiovascular: regular rhythm, normal rate, no murmurs. S1 and S2 normal. Radial pulses normal  bilaterally. No jugular venous distention.  Respiratory: clear to auscultation bilaterally GI : normal bowel sounds, soft and nontender. No distention.   MSK: extremities warm, well perfused. No edema.  NEURO: grossly nonfocal exam, moves all extremities. PSYCH: alert and oriented x 3, normal mood and affect.   All available labs, radiology testing, previous records reviewed. Agree with documented assessment and plan of my colleague as stated above with the following additions or changes:  Principal Problem:   Acute systolic CHF (congestive heart failure) (HCC) Active Problems:   Anasarca   Elevated LFTs   AKI (acute kidney injury) (HCC)   Acute hypoxemic respiratory failure (HCC)    Plan: He will likely require an ischemic evaluation with coronary angiography, and would benefit from a right heart catheterization at that time as well.  I have discussed with the patient that it would be best to optimize his volume status for the benefit of right heart cath investigation as well as his ability to lie flat.  We will proceed day by day, however it is likely he will require an additional day or 2 of intravenous diuresis prior to optimization for coronary  angiography.  I discussed with the patient and his partner in the room the ischemic and nonischemic causes of reduced ejection fraction cardiomyopathy, and what steps we would take in either case.  I have answered their questions the best my ability.  Consider addition of Entresto prior to hospital discharge.  With active aggressive diuresis and plan for coronary angiography with IV contrast, perhaps we can consider starting Entresto after we have ensured stable renal function.  Time Spent Directly with Patient:  I have spent a total of 35 minutes with the patient reviewing hospital notes, telemetry, EKGs, labs and examining the patient as well as establishing an assessment and plan that was discussed personally with the patient.  > 50% of time was spent in direct patient care.  Length of Stay:  LOS: 4 days   Parke Poisson, MD [facility] 3:01 PM  10/05/2018

## 2018-10-05 NOTE — Plan of Care (Signed)
Nutrition Education Note  RD consulted for nutrition education regarding new onset CHF.  RD provided "Eating Right with less salt" handout from the [employer] of [employer]. Reviewed patient's dietary recall. Provided examples on ways to decrease sodium intake in diet. Discouraged intake of processed foods and use of salt shaker. Encouraged fresh fruits and vegetables as well as whole grain sources of carbohydrates to maximize fiber intake.   RD discussed why it is important for patient to adhere to diet recommendations.Teach back method used.  Expect good compliance. Patient reports already not adding salt to foods, he cooks his meats by grilling, smoking and baking for flavor. Pt drains the fat from most of his meats. He eats grilled vegetables with his meats.   Body mass index is 38.64 kg/m. Pt meets criteria for obesity based on current BMI.  Current diet order is 2GMNA with 1500 ml fluid restriction, patient is consuming approximately 100% of meals at this time. Labs and medications reviewed. No further nutrition interventions warranted at this time. If additional nutrition issues arise, please re-consult RD.   Tilda Franco, MS, RD, LDN [facility] Inpatient Clinical Dietitian Pager: [phone] After Hours Pager: [phone]

## 2018-10-06 LAB — COMPREHENSIVE METABOLIC PANEL
ALT: 87 U/L — ABNORMAL HIGH (ref 0–44)
AST: 52 U/L — ABNORMAL HIGH (ref 15–41)
Albumin: 2.1 g/dL — ABNORMAL LOW (ref 3.5–5.0)
Alkaline Phosphatase: 108 U/L (ref 38–126)
Anion gap: 9 (ref 5–15)
BUN: 24 mg/dL — ABNORMAL HIGH (ref 6–20)
CHLORIDE: 108 mmol/L (ref 98–111)
CO2: 26 mmol/L (ref 22–32)
Calcium: 7.7 mg/dL — ABNORMAL LOW (ref 8.9–10.3)
Creatinine, Ser: 0.69 mg/dL (ref 0.61–1.24)
Glucose, Bld: 104 mg/dL — ABNORMAL HIGH (ref 70–99)
Potassium: 3.6 mmol/L (ref 3.5–5.1)
Sodium: 143 mmol/L (ref 135–145)
Total Bilirubin: 2.9 mg/dL — ABNORMAL HIGH (ref 0.3–1.2)
Total Protein: 5.2 g/dL — ABNORMAL LOW (ref 6.5–8.1)

## 2018-10-06 MED ORDER — POTASSIUM CHLORIDE CRYS ER 20 MEQ PO TBCR
30.0000 meq | EXTENDED_RELEASE_TABLET | Freq: Once | ORAL | Status: AC
  Administered 2018-10-06: 30 meq via ORAL
  Filled 2018-10-06: qty 1

## 2018-10-06 NOTE — Progress Notes (Signed)
PROGRESS NOTE  Aaron [name] QIW:[id number] DOB: 02/05/1966 DOA: 10/01/2018 PCP: [facility] [place]   LOS: 5 days   Brief Narrative / Interim history: Aaron [name] is a 53 y.o. 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  He was also found to have elevated liver enzymes as well as acute kidney injury  Subjective: -Continues to get better, no chest pain, no shortness of breath at rest.  Assessment & Plan: Principal Problem:   Acute systolic CHF (congestive heart failure) (HCC) Active Problems:   Anasarca   Elevated LFTs   AKI (acute kidney injury) (HCC)   Acute hypoxemic respiratory failure (HCC)   Principal Problem Acute hypoxic respiratory failure due to acute systolic CHF with anasarca -Patient's respiratory status improving with diuresis, continue Lasix, -2D echo done on 10/02/2018 showed an severely depressed EF of 15% which is new for the patient.  Cardiology was consulted and are following, for now optimize fluid status and will need cardiac catheterization at [facility] hopefully next week -He is net -9.7 L, renal function is stable  Intermittent VT/SVT -Started on low-dose Coreg, telemetry improved  Active Problems Elevated liver enzymes -Likely in the setting of congestive hepatopathy due to heart failure. -LFTs continue to improve this morning  Hypokalemia -Continue to replete with goal 4.0  Acute kidney injury -Resolved, creatinine now within normal limits, monitor while on diuresis  Leukocytosis/concern for pneumonia -No upper respiratory symptoms, afebrile, no productive cough.  Continue to monitor off antibiotics   Scheduled Meds: . carvedilol  3.125 mg Oral BID WC  . furosemide  40 mg Intravenous TID   . mouth rinse  15 mL Mouth Rinse BID  . nicotine  21 mg Transdermal Daily   Continuous Infusions: PRN Meds:.traMADol  DVT prophylaxis: SCDs Code Status: Full code Family Communication: Wife present at bedside Disposition Plan: TBD  Consultants:   Cardiology   Procedures:   2D echo:  IMPRESSIONS  1. The left ventricle has a visually estimated ejection fraction of. The cavity size was severely dilated. Left ventricular diastolic Doppler parameters are consistent with restrictive filling Left ventrical global hypokinesis without regional wall  motion abnormalities.  2. Diffuse hypokinesis EF 15% Given severithy of LV dysfunction consider CHF team consult.  3. The right ventricle has normal systolic function. The cavity was normal. There is no increase in right ventricular wall thickness.  4. Left atrial size was moderately dilated.  5. The mitral valve is normal in structure. Mild thickening of the mitral valve leaflet. Mitral valve regurgitation is moderate by color flow Doppler.  6. The tricuspid valve is normal in structure.  7. The aortic valve is tricuspid Mild thickening of the aortic valve Aortic valve regurgitation is mild by color flow Doppler.  8. The pulmonic valve was grossly normal. Pulmonic valve regurgitation is mild by color flow Doppler.  Antimicrobials:  None    Objective: Vitals:   10/05/18 2100 10/05/18 2134 10/06/18 0507 10/06/18 1309  BP:  107/83 108/83 101/77  Pulse: 88 89 82 85  Resp: 20     Temp:  97.8 F (36.6 C) 97.8 F (36.6 C)   TempSrc:  Oral Oral   SpO2:  98% 94% 100%  Weight:      Height:  Intake/Output Summary (Last 24 hours) at 10/06/2018 1333 Last data filed at 10/06/2018 1253 Gross per 24 hour  Intake 1318 ml  Output 1475 ml  Net -157 ml   Filed Weights   10/03/18 0500 10/04/18 0500 10/05/18 0500  Weight: (!) 150.1 kg (!) 146.2 kg (!) 144 kg    Examination:  Constitutional: No distress Eyes: No scleral  icterus ENMT: Moist membranes Respiratory: Diminished at the bases but no wheezing, no crackles, moves air well Cardiovascular: Regular rate and rhythm, no murmurs.  1+ pitting lower extremity edema, anasarca improving Abdomen: Soft, nontender, nondistended, positive bowel sounds Musculoskeletal: No clubbing or cyanosis. Skin: No rashes seen Neurologic: No focal deficits Psychiatric: Normal judgment and insight. Alert and oriented x 3.    Data Reviewed: I have independently reviewed following labs and imaging studies   CBC: Recent Labs  Lab 10/01/18 1336 10/02/18 0527  WBC 13.5* 12.7*  NEUTROABS 11.4*  --   HGB 15.1 15.2  HCT 49.3 52.2*  MCV 95.4 97.8  PLT 203 170   Basic Metabolic Panel: Recent Labs  Lab 10/02/18 0527 10/03/18 0517 10/04/18 0535 10/05/18 0448 10/05/18 1241 10/06/18 0527  NA 138 139 140 145  --  143  K 4.4 4.0 3.4* 3.6  --  3.6  CL 106 107 108 107  --  108  CO2 21* 20* 26 29  --  26  GLUCOSE 117* 118* 136* 115*  --  104*  BUN 52* 46* 38* 29*  --  24*  CREATININE 1.24 1.13 0.95 0.72  --  0.69  CALCIUM 8.4* 8.1* 8.0* 8.0*  --  7.7*  MG  --   --   --   --  2.1  --    GFR: Estimated Creatinine Clearance: 163.8 mL/min (by C-G formula based on SCr of 0.69 mg/dL). Liver Function Tests: Recent Labs  Lab 10/01/18 1336 10/02/18 0527 10/03/18 0517 10/06/18 0527  AST 110* 81* 57* 52*  ALT 268* 219* 154* 87*  ALKPHOS 153* 144* 138* 108  BILITOT 6.2* 5.2* 4.3* 2.9*  PROT 6.4* 6.4* 5.8* 5.2*  ALBUMIN 3.3* 3.2* 2.7* 2.1*   Recent Labs  Lab 10/01/18 1336  LIPASE 46   Recent Labs  Lab 10/01/18 1337  AMMONIA 15   Coagulation Profile: Recent Labs  Lab 10/01/18 1353 10/02/18 0527  INR 1.4* 1.4*   Cardiac Enzymes: No results for input(s): CKTOTAL, CKMB, CKMBINDEX, TROPONINI in the last 168 hours. BNP (last 3 results) No results for input(s): PROBNP in the last 8760 hours. HbA1C: No results for input(s): HGBA1C in the last 72  hours. CBG: No results for input(s): GLUCAP in the last 168 hours. Lipid Profile: No results for input(s): CHOL, HDL, LDLCALC, TRIG, CHOLHDL, LDLDIRECT in the last 72 hours. Thyroid Function Tests: Recent Labs    10/05/18 1241  FREET4 0.99   Anemia Panel: No results for input(s): VITAMINB12, FOLATE, FERRITIN, TIBC, IRON, RETICCTPCT in the last 72 hours. Urine analysis:    Component Value Date/Time   COLORURINE AMBER (A) 10/01/2018 1523   APPEARANCEUR HAZY (A) 10/01/2018 1523   LABSPEC 1.023 10/01/2018 1523   PHURINE 5.0 10/01/2018 1523   GLUCOSEU NEGATIVE 10/01/2018 1523   HGBUR SMALL (A) 10/01/2018 1523   BILIRUBINUR NEGATIVE 10/01/2018 1523   KETONESUR NEGATIVE 10/01/2018 1523   PROTEINUR NEGATIVE 10/01/2018 1523   NITRITE NEGATIVE 10/01/2018 1523   LEUKOCYTESUR NEGATIVE 10/01/2018 1523   Sepsis Labs: Invalid input(s): PROCALCITONIN, LACTICIDVEN  No results found for this or any previous  visit (from the past 240 hour(s)).    Radiology Studies: No results found.  Pamella Pert, MD, PhD [facility]  Contact via  [email or web address]  [facility] Office Info P: [phone]  F: [phone]

## 2018-10-06 NOTE — Progress Notes (Signed)
Physical Therapy Treatment Patient Details Name: Aaron [name] MRN: [id number] DOB: August 02, 1965 Today's Date: 10/06/2018    History of Present Illness 53 y.o. 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 New onset of anasacra and CHF with EF of 15%.     PT Comments    Progressing with mobility. Pt was able to stand and pivot to the recliner on today. He tolerated the activity well. O2 sat >90% on 2.5L [place] during session. Dyspnea 2/4. Pt is motivated to regain PLOF. Will continue to progress activity as tolerated.     Follow Up Recommendations  Home health PT;Supervision/Assistance - 24 hour(pt and wife decline placement)     Equipment Recommendations  None recommended by PT    Recommendations for Other Services       Precautions / Restrictions Precautions Precautions: Fall Precaution Comments: monitor O2 Restrictions Weight Bearing Restrictions: No    Mobility  Bed Mobility Overal bed mobility: Needs Assistance Bed Mobility: Supine to Sit     Supine to sit: Min assist;HOB elevated        Transfers Overall transfer level: Needs assistance Equipment used: Rolling walker (2 wheeled) Transfers: Sit to/from Stand Sit to Stand: Min assist;+2 physical assistance;+2 safety/equipment;From elevated surface         General transfer comment: Assist to rise, stabilize, control descent. VCs safety, technique, hand placement. Stand pivot, bed to recliner, with RW. Pt tolerated well. O2 sat 92% on 2.5L [place] after activity.   Ambulation/Gait             General Gait Details: NT   Stairs             Wheelchair Mobility    Modified Rankin (Stroke Patients Only)       Balance                                            Cognition  Arousal/Alertness: Awake/alert Behavior During Therapy: WFL for tasks assessed/performed Overall Cognitive Status: Within Functional Limits for tasks assessed                                        Exercises General Exercises - Lower Extremity Long Arc Quad: 15 reps;Both;Seated;AROM Hip Flexion/Marching: AROM;Both;15 reps;Seated(pt unable to completely lift feet off floor)    General Comments        Pertinent Vitals/Pain Pain Assessment: No/denies pain    Home Living                      Prior Function            PT Goals (current goals can now be found in the care plan section) Progress towards PT goals: Progressing toward goals    Frequency    Min 3X/week      PT Plan Current plan remains appropriate    Co-evaluation              AM-PAC PT "6 Clicks" Mobility   Outcome Measure  Help needed turning from your back to your side while in a flat bed without using bedrails?: A Little  Help needed moving from lying on your back to sitting on the side of a flat bed without using bedrails?: A Little Help needed moving to and from a bed to a chair (including a wheelchair)?: A Lot Help needed standing up from a chair using your arms (e.g., wheelchair or bedside chair)?: A Lot Help needed to walk in hospital room?: A Lot Help needed climbing 3-5 steps with a railing? : Total 6 Click Score: 13    End of Session Equipment Utilized During Treatment: Oxygen Activity Tolerance: Patient tolerated treatment well Patient left: in chair;with call bell/phone within reach;with family/visitor present   PT Visit Diagnosis: Muscle weakness (generalized) (M62.81);Difficulty in walking, not elsewhere classified (R26.2)     Time: 8101-7510 PT Time Calculation (min) (ACUTE ONLY): 17 min  Charges:  $Therapeutic Activity: 8-22 mins                       Rebeca Alert, PT Acute Rehabilitation Services Pager: [phone] Office: [phone]

## 2018-10-06 NOTE — Progress Notes (Signed)
Progress Note  Patient Name: Aaron [name] Date of Encounter: 10/06/2018  Primary Cardiologist: Jodelle Red, MD   Subjective   SOB improving, as is leg edema.  Inpatient Medications    Scheduled Meds: . carvedilol  3.125 mg Oral BID WC  . furosemide  40 mg Intravenous TID  . mouth rinse  15 mL Mouth Rinse BID  . nicotine  21 mg Transdermal Daily   Continuous Infusions:  PRN Meds: traMADol   Vital Signs    Vitals:   10/05/18 1317 10/05/18 2100 10/05/18 2134 10/06/18 0507  BP: 100/78  107/83 108/83  Pulse: 82 88 89 82  Resp: 19 20    Temp: 98.3 F (36.8 C)  97.8 F (36.6 C) 97.8 F (36.6 C)  TempSrc: Oral  Oral Oral  SpO2: 95%  98% 94%  Weight:      Height:        Intake/Output Summary (Last 24 hours) at 10/06/2018 1222 Last data filed at 10/06/2018 1048 Gross per 24 hour  Intake 1318 ml  Output 2725 ml  Net -1407 ml   Filed Weights   10/03/18 0500 10/04/18 0500 10/05/18 0500  Weight: (!) 150.1 kg (!) 146.2 kg (!) 144 kg    Telemetry     6 beat episode of nonsustained ventricular tachycardia, occasional PVCs- Personally Reviewed  ECG    No new ECG.  Physical Exam   GEN: No acute distress.   Neck:  JVD to mid one third of the neck. Cardiac: regular rhythm, normal rate, no murmurs, rubs, or gallops.  Respiratory: Clear to auscultation bilaterally. GI: Soft, nontender, non-distended  MS:  Bilateral nonpitting edema; No deformity. Neuro:  Nonfocal  Psych: Normal affect   Labs    Chemistry Recent Labs  Lab 10/02/18 0527 10/03/18 0517 10/04/18 0535 10/05/18 0448 10/06/18 0527  NA 138 139 140 145 143  K 4.4 4.0 3.4* 3.6 3.6  CL 106 107 108 107 108  CO2 21* 20* 26 29 26   GLUCOSE 117* 118* 136* 115* 104*  BUN 52* 46* 38* 29* 24*  CREATININE 1.24 1.13 0.95 0.72 0.69  CALCIUM 8.4* 8.1* 8.0* 8.0* 7.7*  PROT 6.4* 5.8*  --   --  5.2*  ALBUMIN 3.2* 2.7*  --   --  2.1*  AST 81* 57*  --   --  52*  ALT 219* 154*  --   --  87*    ALKPHOS 144* 138*  --   --  108  BILITOT 5.2* 4.3*  --   --  2.9*  GFRNONAA >60 >60 >60 >60 >60  GFRAA >60 >60 >60 >60 >60  ANIONGAP 11 12 6 9 9      Hematology Recent Labs  Lab 10/01/18 1336 10/02/18 0527  WBC 13.5* 12.7*  RBC 5.17 5.34  HGB 15.1 15.2  HCT 49.3 52.2*  MCV 95.4 97.8  MCH 29.2 28.5  MCHC 30.6 29.1*  RDW 17.4* 17.6*  PLT 203 170    Cardiac EnzymesNo results for input(s): TROPONINI in the last 168 hours. No results for input(s): TROPIPOC in the last 168 hours.   BNP Recent Labs  Lab 10/01/18 1337 10/01/18 1720  BNP 2,769.2* 2,333.1*     DDimer No results for input(s): DDIMER in the last 168 hours.   Radiology    No results found.  Cardiac Studies   Echocardiogram 10/02/2018 demonstrated ejection fraction 15%.  Patient Profile     Mr. Garces is a 53 y.o. male with a history of  obesity who was admitted on 10/01/2018 for bilateral lower extremity edema. Cardiology was consulted for acute CHF.  Assessment & Plan   Principal Problem:   Acute systolic CHF (congestive heart failure) (HCC) Active Problems:   Anasarca   Elevated LFTs   AKI (acute kidney injury) (HCC)   Acute hypoxemic respiratory failure (HCC)   Continue diuresis, the patient is diuresing well but likely requires at least an additional day of diuresis prior to investigation of heart failure.  Renal function is stable.  I reiterated the plan to the patient and his wife today and answered their questions to the best my ability.  We will tentatively plan for right and left heart cath on Thursday, 10/08/2018.  Pending results of right and left heart cath, we will consider advanced heart failure consultation.     For questions or updates, please contact [facility] Please consult [email or web address] for contact info under        Signed, Parke Poisson, MD  10/06/2018, 12:22 PM

## 2018-10-06 NOTE — Plan of Care (Signed)
Plan of care reviewed and discussed with the patient. 

## 2018-10-07 LAB — BASIC METABOLIC PANEL
Anion gap: 5 (ref 5–15)
BUN: 22 mg/dL — ABNORMAL HIGH (ref 6–20)
CO2: 30 mmol/L (ref 22–32)
Calcium: 7.7 mg/dL — ABNORMAL LOW (ref 8.9–10.3)
Chloride: 105 mmol/L (ref 98–111)
Creatinine, Ser: 0.69 mg/dL (ref 0.61–1.24)
GFR calc Af Amer: >60 mL/min (ref >60)
GFR calc non Af Amer: >60 mL/min (ref >60)
Glucose, Bld: 103 mg/dL — ABNORMAL HIGH (ref 70–99)
POTASSIUM: 3.3 mmol/L — AB (ref 3.5–5.1)
Sodium: 140 mmol/L (ref 135–145)

## 2018-10-07 MED ORDER — CARVEDILOL 6.25 MG PO TABS
6.2500 mg | ORAL_TABLET | Freq: Two times a day (BID) | ORAL | Status: DC
  Administered 2018-10-07 – 2018-10-09 (×4): 6.25 mg via ORAL
  Filled 2018-10-07 (×4): qty 1

## 2018-10-07 MED ORDER — POTASSIUM CHLORIDE CRYS ER 20 MEQ PO TBCR
40.0000 meq | EXTENDED_RELEASE_TABLET | Freq: Once | ORAL | Status: AC
  Administered 2018-10-07: 40 meq via ORAL
  Filled 2018-10-07: qty 2

## 2018-10-07 NOTE — TOC Initial Note (Signed)
Transition of Care [facility]) - Initial/Assessment Note    Patient Details  Name: Aaron [name] MRN: [id number] Date of Birth: 12/11/1965  Transition of Care [facility]) CM/SW Contact:    Althea Charon, LCSW Phone Number: 10/07/2018, 4:33 PM  Clinical Narrative:   CSW spoke with patient at bedside about discharge plans. Patient stated he is agreeable to discharge home with home health. CSW went over the process of receiving charity home health. CSW stated that patient will be set up with [facility] and company will be responsible on determining if patient is eligible for charity home health. CSW spoke with Alvino Chapel and she stated she will reach out to patient and wife and talk more about home health. Alvino Chapel stated she will let me know if patient qualify's for home health. CSW will continue to follow.          Expected Discharge Plan: Home w Home Health Services     Patient Goals and CMS Choice Patient states their goals for this hospitalization and ongoing recovery are:: to go home and to make sure hospital gets paid for his care      Expected Discharge Plan and Services Expected Discharge Plan: Home w Home Health Services   Post Acute Care Choice: Home Health Living arrangements for the past 2 months: Single Family Home Expected Discharge Date: (unknown)                        Prior Living Arrangements/Services Living arrangements for the past 2 months: Single Family Home Lives with:: Self, Adult Children, Spouse Patient language and need for interpreter reviewed:: No Do you feel safe going back to the place where you live?: Yes      Need for Family Participation in Patient Care: Yes (Comment) Care giver support system in place?: Yes (comment)   Criminal Activity/Legal Involvement Pertinent to Current Situation/Hospitalization: No - Comment as needed  Activities of Daily Living Home Assistive Devices/Equipment: None ADL Screening (condition at time of admission) Patient's  cognitive ability adequate to safely complete daily activities?: Yes Is the patient deaf or have difficulty hearing?: No Does the patient have difficulty seeing, even when wearing glasses/contacts?: No Does the patient have difficulty concentrating, remembering, or making decisions?: No Patient able to express need for assistance with ADLs?: Yes Does the patient have difficulty dressing or bathing?: Yes Independently performs ADLs?: No Communication: Independent Dressing (OT): Needs assistance Is this a change from baseline?: Change from baseline, expected to last >3 days Grooming: Needs assistance Is this a change from baseline?: Change from baseline, expected to last >3 days Feeding: Needs assistance Is this a change from baseline?: Change from baseline, expected to last >3 days Bathing: Needs assistance Is this a change from baseline?: Change from baseline, expected to last >3 days Toileting: Dependent Is this a change from baseline?: Change from baseline, expected to last >3days In/Out Bed: Dependent Is this a change from baseline?: Change from baseline, expected to last >3 days Walks in Home: Dependent Is this a change from baseline?: Change from baseline, expected to last >3 days Does the patient have difficulty walking or climbing stairs?: Yes(secondary to bilateral leg swelling and shortness of breath) Weakness of Legs: Both Weakness of Arms/Hands: Both  Permission Sought/Granted Permission sought to share information with : Family Supports Permission granted to share information with : Yes, Verbal Permission Granted  Share Information with NAME: Thornell Mule;   Permission granted to share info w AGENCY: home health  Permission granted to share info w Relationship: spouse  Permission granted to share info w Contact Information: [phone]  Emotional Assessment Appearance:: Appears stated age Attitude/Demeanor/Rapport: Engaged Affect (typically observed): Accepting,  Stable, Pleasant Orientation: : Oriented to  Time, Oriented to Place, Oriented to Self, Oriented to Situation Alcohol / Substance Use: Not Applicable Psych Involvement: No (comment)  Admission diagnosis:  Shortness of breath [R06.02] Anasarca [R60.1] Pleural effusion [J90] AKI (acute kidney injury) (HCC) [N17.9] Acute liver failure without hepatic coma [K72.00] Community acquired pneumonia, unspecified laterality [J18.9] Patient Active Problem List   Diagnosis Date Noted  . Acute systolic CHF (congestive heart failure) (HCC) 10/02/2018  . Acute hypoxemic respiratory failure (HCC) 10/02/2018  . Anasarca 10/01/2018  . Elevated LFTs 10/01/2018  . AKI (acute kidney injury) (HCC) 10/01/2018   PCP:  [facility] [place] [facility]:   [facility] [phone] - [place], [place] - [address] AT [address] [facility] [address]  [place] [address] Phone: [phone] Fax: [phone]     Social Determinants of Health (SDOH) Interventions    Readmission Risk Interventions 30 Day Unplanned Readmission Risk Score     ED to Hosp-Admission (Current) from 10/01/2018 in [facility] TELEMETRY/UROLOGY EAST  30 Day Unplanned Readmission Risk Score (%)  10 Filed at 10/07/2018 1600     This score is the patient's risk of an unplanned readmission within 30 days of being discharged (0 -100%). The score is based on dignosis, age, lab data, medications, orders, and past utilization.   Low:  0-14.9   Medium: 15-21.9   High: 22-29.9   Extreme: 30 and above       No flowsheet data found.

## 2018-10-07 NOTE — Progress Notes (Signed)
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  He was also found to have elevated liver enzymes as well as acute kidney injury   Assessment & Plan:   Principal Problem:   Acute systolic CHF (congestive heart failure) (HCC) Active Problems:   Anasarca   Elevated LFTs   AKI (acute kidney injury) (HCC)   Acute hypoxemic respiratory failure (HCC)   Acute hypoxic respiratory failure due to acute systolic CHF with anasarca -Patient's respiratory status improving with diuresis, continue Lasix, -2D echo done on 10/02/2018 showed an severely depressed EF of 15% which is new for the patient.  Cardiology was consulted and are following, for now optimize fluid status and will need cardiac catheterization at [facility] hopefully next week -He is net -10.5 L, renal function is stable PT consult  Intermittent VT/SVT -Started on low-dose Coreg, telemetry improved  Active Problems Elevated liver enzymes -Likely in the setting of congestive hepatopathy due to heart failure. -LFTs continue to improve this morning  Hypokalemia - replete with goal 4.0  Acute kidney injury -Resolved, creatinine now within normal limits, monitor while on diuresis  Leukocytosis/concern for pneumonia -No upper respiratory symptoms, afebrile, no productive cough.  Continue to monitor off antibiotics  DVT prophylaxis: SCDs Code Status: Full code Family Communication: Wife present at bedside Disposition Plan: TBD  Procedures:   2D echo:  IMPRESSIONS 1. The left ventricle has a visually estimated ejection fraction of. The  cavity size was severely dilated. Left ventricular diastolic Doppler parameters are consistent with restrictive filling Left ventrical global hypokinesis without regional wall  motion abnormalities. 2. Diffuse hypokinesis EF 15% Given severithy of LV dysfunction consider CHF team consult. 3. The right ventricle has normal systolic function. The cavity was normal. There is no increase in right ventricular wall thickness. 4. Left atrial size was moderately dilated. 5. The mitral valve is normal in structure. Mild thickening of the mitral valve leaflet. Mitral valve regurgitation is moderate by color flow Doppler. 6. The tricuspid valve is normal in structure. 7. The aortic valve is tricuspid Mild thickening of the aortic valve Aortic valve regurgitation is mild by color flow Doppler. 8. The pulmonic valve was grossly normal. Pulmonic valve regurgitation is mild by color flow Doppler.  Antimicrobials:  None        Consultants:   Cardiology   Estimated body mass index is 37.19 kg/m as calculated from the following:   Height as of this encounter: 6\' 4"  (1.93 m).   Weight as of this encounter: 138.6 kg.   Subjective: Patient resting in bed speaking in full sentences does not appear to be in any distress feels edema is coming down however he has not walked much since he has come to the hospital he lives at home with his family and children and he walks at home without a walker  Objective: Vitals:   10/06/18 0507 10/06/18 1309 10/06/18 2231 10/07/18 0427  BP: 108/83 101/77 106/79 116/76  Pulse: 82 85 98 97  Resp:   18 20  Temp: 97.8 F (36.6 C)  98.5 F (36.9 C) 97.8 F (36.6 C)  TempSrc: Oral  Oral Oral  SpO2: 94% 100% 96% 92%  Weight:    (!) 138.6 kg  Height:        Intake/Output Summary (Last 24 hours) at 10/07/2018 0932 Last data filed at 10/07/2018 0441 Gross per 24 hour  Intake 1212 ml  Output 2125 ml  Net -913 ml   Filed Weights   10/04/18 0500 10/05/18  0500 10/07/18 0427  Weight: (!) 146.2 kg (!) 144 kg (!) 138.6 kg    Examination:  General exam: Appears calm and comfortable  Respiratory system: Diminished breath sounds at the bases to auscultation. Respiratory effort normal. Cardiovascular system: S1 & S2 heard, RRR. No JVD, murmurs, rubs, gallops or clicks. No pedal edema. Gastrointestinal system: Abdomen is nondistended, soft and nontender. No organomegaly or masses felt. Normal bowel sounds heard. Central nervous system: Alert and oriented. No focal neurological deficits. Extremities: 2+ pitting edema Skin: No rashes, lesions or ulcers Psychiatry: Judgement and insight appear normal. Mood & affect appropriate.     Data Reviewed: I have personally reviewed following labs and imaging studies  CBC: Recent Labs  Lab 10/01/18 1336 10/02/18 0527  WBC 13.5* 12.7*  NEUTROABS 11.4*  --   HGB 15.1 15.2  HCT 49.3 52.2*  MCV 95.4 97.8  PLT 203 170   Basic Metabolic Panel: Recent Labs  Lab 10/03/18 0517 10/04/18 0535 10/05/18 0448 10/05/18 1241 10/06/18 0527 10/07/18 0534  NA 139 140 145  --  143 140  K 4.0 3.4* 3.6  --  3.6 3.3*  CL 107 108 107  --  108 105  CO2 20* 26 29  --  26 30  GLUCOSE 118* 136* 115*  --  104* 103*  BUN 46* 38* 29*  --  24* 22*  CREATININE 1.13 0.95 0.72  --  0.69 0.69  CALCIUM 8.1* 8.0* 8.0*  --  7.7* 7.7*  MG  --   --   --  2.1  --   --    GFR: Estimated Creatinine Clearance: 160.5 mL/min (by C-G formula based on SCr of 0.69 mg/dL). Liver Function Tests: Recent Labs  Lab 10/01/18 1336 10/02/18 0527 10/03/18 0517 10/06/18 0527  AST 110* 81* 57* 52*  ALT 268* 219* 154* 87*  ALKPHOS 153* 144* 138* 108  BILITOT 6.2* 5.2* 4.3* 2.9*  PROT 6.4* 6.4* 5.8* 5.2*  ALBUMIN 3.3* 3.2* 2.7* 2.1*   Recent Labs  Lab 10/01/18 1336  LIPASE 46   Recent Labs  Lab 10/01/18 1337  AMMONIA 15   Coagulation Profile: Recent Labs  Lab 10/01/18 1353 10/02/18 0527  INR 1.4* 1.4*   Cardiac  Enzymes: No results for input(s): CKTOTAL, CKMB, CKMBINDEX, TROPONINI in the last 168 hours. BNP (last 3 results) No results for input(s): PROBNP in the last 8760 hours. HbA1C: No results for input(s): HGBA1C in the last 72 hours. CBG: No results for input(s): GLUCAP in the last 168 hours. Lipid Profile: No results for input(s): CHOL, HDL, LDLCALC, TRIG, CHOLHDL, LDLDIRECT in the last 72 hours. Thyroid Function Tests: Recent Labs    10/05/18 1241  FREET4 0.99   Anemia Panel: No results for input(s): VITAMINB12, FOLATE, FERRITIN, TIBC, IRON, RETICCTPCT in the last 72 hours. Sepsis Labs: No results for input(s): PROCALCITON, LATICACIDVEN in the last 168 hours.  No results found for this or any previous visit (from the past 240 hour(s)).       Radiology Studies: No results found.      Scheduled Meds: . carvedilol  3.125 mg Oral BID  WC  . furosemide  40 mg Intravenous TID  . mouth rinse  15 mL Mouth Rinse BID  . nicotine  21 mg Transdermal Daily   Continuous Infusions:   LOS: 6 days     Alwyn Ren, MD [facility]  If 7PM-7AM, please contact night-coverage [email or web address] Password [email or web address] 10/07/2018, 9:32 AM

## 2018-10-07 NOTE — Progress Notes (Addendum)
Progress Note  Patient Name: Aaron [name] Date of Encounter: 10/07/2018  Primary Cardiologist: Jodelle Red, MD   Subjective   No significant overnight events. Breathing and lower extremity edema continue to improve. No chest pain or palpitations. No other complaints at this time.  Inpatient Medications    Scheduled Meds: . carvedilol  3.125 mg Oral BID WC  . furosemide  40 mg Intravenous TID  . mouth rinse  15 mL Mouth Rinse BID  . nicotine  21 mg Transdermal Daily   Continuous Infusions:  PRN Meds: traMADol   Vital Signs    Vitals:   10/06/18 0507 10/06/18 1309 10/06/18 2231 10/07/18 0427  BP: 108/83 101/77 106/79 116/76  Pulse: 82 85 98 97  Resp:   18 20  Temp: 97.8 F (36.6 C)  98.5 F (36.9 C) 97.8 F (36.6 C)  TempSrc: Oral  Oral Oral  SpO2: 94% 100% 96% 92%  Weight:    (!) 138.6 kg  Height:        Intake/Output Summary (Last 24 hours) at 10/07/2018 1135 Last data filed at 10/07/2018 0900 Gross per 24 hour  Intake 1572 ml  Output 2125 ml  Net -553 ml   Filed Weights   10/04/18 0500 10/05/18 0500 10/07/18 0427  Weight: (!) 146.2 kg (!) 144 kg (!) 138.6 kg    Telemetry    Sinus rhythm with multiple PVCs. A few runs of non-sustained VT with the longest one being 7 beats. One runs of SVT of 10 beats also noted. - Personally Reviewed  ECG    No new ECG tracing today. - Personally Reviewed  Physical Exam   GEN: Caucasian male resting comfortably. Alert and in no acute distress.   Neck: Supple. No JVD appreciated with patient sitting upright. Cardiac: RRR. No significant murmurs, gallops, or rubs. Radial pulses 2+ and equal bilaterally.  Respiratory: No significant increased work of breathing. Clear to ausculation bilaterally. GI: Abdomen soft, obese/mildly distended, and non-tender. Bowel sounds present. MS: Non-pitting edema of bilateral lower extremities. No deformity. Skin: Warm and dry. Neuro:  No focal deficits. Psych: Normal  affect. Responds appropriately.  Labs    Chemistry Recent Labs  Lab 10/02/18 0527 10/03/18 0517  10/05/18 0448 10/06/18 0527 10/07/18 0534  NA 138 139   < > 145 143 140  K 4.4 4.0   < > 3.6 3.6 3.3*  CL 106 107   < > 107 108 105  CO2 21* 20*   < > 29 26 30   GLUCOSE 117* 118*   < > 115* 104* 103*  BUN 52* 46*   < > 29* 24* 22*  CREATININE 1.24 1.13   < > 0.72 0.69 0.69  CALCIUM 8.4* 8.1*   < > 8.0* 7.7* 7.7*  PROT 6.4* 5.8*  --   --  5.2*  --   ALBUMIN 3.2* 2.7*  --   --  2.1*  --   AST 81* 57*  --   --  52*  --   ALT 219* 154*  --   --  87*  --   ALKPHOS 144* 138*  --   --  108  --   BILITOT 5.2* 4.3*  --   --  2.9*  --   GFRNONAA >60 >60   < > >60 >60 >60  GFRAA >60 >60   < > >60 >60 >60  ANIONGAP 11 12   < > 9 9 5    < > = values in this  interval not displayed.     Hematology Recent Labs  Lab 10/01/18 1336 10/02/18 0527  WBC 13.5* 12.7*  RBC 5.17 5.34  HGB 15.1 15.2  HCT 49.3 52.2*  MCV 95.4 97.8  MCH 29.2 28.5  MCHC 30.6 29.1*  RDW 17.4* 17.6*  PLT 203 170    Cardiac EnzymesNo results for input(s): TROPONINI in the last 168 hours. No results for input(s): TROPIPOC in the last 168 hours.   BNP Recent Labs  Lab 10/01/18 1337 10/01/18 1720  BNP 2,769.2* 2,333.1*     DDimer No results for input(s): DDIMER in the last 168 hours.   Radiology    No results found.  Cardiac Studies   Echocardiogram 10/02/2018: Impression: 1. The left ventricle has a visually estimated ejection fraction of. The cavity size was severely dilated. Left ventricular diastolic Doppler parameters are consistent with restrictive filling Left ventrical global hypokinesis without regional wall  motion abnormalities.  2. Diffuse hypokinesis EF 15% Given severithy of LV dysfunction consider CHF team consult.  3. The right ventricle has normal systolic function. The cavity was normal. There is no increase in right ventricular wall thickness.  4. Left atrial size was moderately  dilated.  5. The mitral valve is normal in structure. Mild thickening of the mitral valve leaflet. Mitral valve regurgitation is moderate by color flow Doppler.  6. The tricuspid valve is normal in structure.  7. The aortic valve is tricuspid Mild thickening of the aortic valve Aortic valve regurgitation is mild by color flow Doppler.  8. The pulmonic valve was grossly normal. Pulmonic valve regurgitation is mild by color flow Doppler.  Patient Profile   Aaron [name] is a 53 y.o. male with a history of obesity who was admitted on 10/01/2018 for bilateral lower extremity edema. Cardiology was consulted for acute CHF.  Assessment & Plan    Acute Systolic CHF - Echo showed LVEF of 15% with global hypokinesis but no regional wall motion abnormalities.  - BNP 2, 769.2 on admission. - Diuresing well on IV Lasix. Documented output of 2.25 L in the past 24 hours with net negative 10 L since admission. Weight 305 lbs today, down from 333 on admission. Renal function stable. - Continue IV Lasix 40mg  three times daily. May be able to decrease to twice daily tomorrow. - Continue Coreg 3.125mg  twice daily. Up-titrate as able. - May start Entresto after right heart catheterization. - Continue to monitor daily weight, strict I/O's, and renal function.  - Will ultimately need right/left heart catheterization this admission.  Suspect he will need a day or two more of diuresis. The patient understands that risks include but are not limited to stroke (1 in 1000), death (1 in 1000), kidney failure [usually temporary] (1 in 500), bleeding (1 in 200), allergic reaction [possibly serious] (1 in 200), and agrees to proceed. Orders placed.   Non-Sustained VT - A few short episodes of non-sustained VT. Longest run being 21 beats on 10/05/2018.  Patient asymptomatic with this. - Potassium 3.3 today. Repleted. Goal >4.0. - Magnesium 2.1 on 10/05/2018. - TSH minimally elevated at 4.716. Free T4 normal. - Continue Coreg as  above. Maximize dose as able.   For questions or updates, please contact [facility] Please consult [email or web address] for contact info under Cardiology/STEMI.      Signed, Corrin Parker, PA-C  10/07/2018, 11:35 AM    ---------------------------------------------------------------------------------------------   History and all data above reviewed.  Patient examined.  I agree with the findings as above.  Aaron [name] feels that he continues to improve and is symptomatically better.  Continues to diurese appropriately.  Constitutional: No acute distress.  Obese.   Cardiovascular: regular rhythm, normal rate, no murmurs. S1 and S2 normal. Radial pulses normal bilaterally. Respiratory: clear to auscultation bilaterally GI : normal bowel sounds, soft and nontender. No distention.   MSK: extremities warm, well perfused.  1+ bilateral edema.  NEURO: grossly nonfocal exam, moves all extremities. PSYCH: alert and oriented x 3, normal mood and affect.   All available labs, radiology testing, previous records reviewed. Agree with documented assessment and plan of my colleague as stated above with the following additions or changes:  Principal Problem:   Acute systolic CHF (congestive heart failure) (HCC) Active Problems:   Anasarca   Elevated LFTs   AKI (acute kidney injury) (HCC)   Acute hypoxemic respiratory failure (HCC)    Plan: The patient participated in shared decision making and determined that a right and left heart catheterization for investigation into the nature of his heart failure and reduced ejection fraction should be performed.  He is better optimized from a volume standpoint.  He would like to have this test performed on Friday when his wife is more available.  This is reasonable and we will continue diuresis as currently ordered today.  We will reassess for de-escalation tomorrow.  Renal function remains stable, and has improved since admission likely representing renal  congestion from heart failure.  Consider up titration of Coreg for nonsustained ventricular tachycardia and heart failure therapy.  We will initiate Entresto prior to hospital dismissal.  Time Spent Directly with Patient:  I have spent a total of 35 minutes with the patient reviewing hospital notes, telemetry, EKGs, labs and examining the patient as well as establishing an assessment and plan that was discussed personally with the patient.  > 50% of time was spent in direct patient care.  Length of Stay:  LOS: 6 days   Parke Poisson, MD [facility] 1:17 PM  10/07/2018

## 2018-10-08 LAB — CBC
HCT: 41.8 % (ref 39.0–52.0)
Hemoglobin: 12.8 g/dL — ABNORMAL LOW (ref 13.0–17.0)
MCH: 29 pg (ref 26.0–34.0)
MCHC: 30.6 g/dL (ref 30.0–36.0)
MCV: 94.8 fL (ref 80.0–100.0)
Platelets: 141 10*3/uL — ABNORMAL LOW (ref 150–400)
RBC: 4.41 MIL/uL (ref 4.22–5.81)
RDW: 16.7 % — ABNORMAL HIGH (ref 11.5–15.5)
WBC: 9 10*3/uL (ref 4.0–10.5)
nRBC: 0 % (ref 0.0–0.2)

## 2018-10-08 LAB — COMPREHENSIVE METABOLIC PANEL
ALK PHOS: 124 U/L (ref 38–126)
ALT: 82 U/L — ABNORMAL HIGH (ref 0–44)
AST: 62 U/L — ABNORMAL HIGH (ref 15–41)
Albumin: 2.2 g/dL — ABNORMAL LOW (ref 3.5–5.0)
Anion gap: 6 (ref 5–15)
BUN: 21 mg/dL — AB (ref 6–20)
CO2: 30 mmol/L (ref 22–32)
Calcium: 7.8 mg/dL — ABNORMAL LOW (ref 8.9–10.3)
Chloride: 103 mmol/L (ref 98–111)
Creatinine, Ser: 0.7 mg/dL (ref 0.61–1.24)
GFR calc Af Amer: >60 mL/min (ref >60)
GFR calc non Af Amer: >60 mL/min (ref >60)
Glucose, Bld: 103 mg/dL — ABNORMAL HIGH (ref 70–99)
Potassium: 3.4 mmol/L — ABNORMAL LOW (ref 3.5–5.1)
Sodium: 139 mmol/L (ref 135–145)
Total Bilirubin: 2.4 mg/dL — ABNORMAL HIGH (ref 0.3–1.2)
Total Protein: 5.5 g/dL — ABNORMAL LOW (ref 6.5–8.1)

## 2018-10-08 MED ORDER — SODIUM CHLORIDE 0.9 % IV SOLN: 250.0000 mL | INTRAVENOUS | Status: DC | PRN

## 2018-10-08 MED ORDER — SODIUM CHLORIDE 0.9 % IV SOLN
INTRAVENOUS | Status: DC
  Administered 2018-10-09: 05:00:00 via INTRAVENOUS

## 2018-10-08 MED ORDER — FUROSEMIDE 10 MG/ML IJ SOLN
40.0000 mg | Freq: Two times a day (BID) | INTRAMUSCULAR | Status: DC
  Administered 2018-10-08 – 2018-10-09 (×2): 40 mg via INTRAVENOUS
  Filled 2018-10-08 (×2): qty 4

## 2018-10-08 MED ORDER — SODIUM CHLORIDE 0.9% FLUSH
3.0000 mL | Freq: Two times a day (BID) | INTRAVENOUS | Status: DC
  Administered 2018-10-08 – 2018-10-09 (×2): 3 mL via INTRAVENOUS

## 2018-10-08 MED ORDER — ASPIRIN 81 MG PO CHEW
81.0000 mg | CHEWABLE_TABLET | ORAL | Status: AC
  Administered 2018-10-09: 81 mg via ORAL
  Filled 2018-10-08: qty 1

## 2018-10-08 MED ORDER — POTASSIUM CHLORIDE CRYS ER 20 MEQ PO TBCR
40.0000 meq | EXTENDED_RELEASE_TABLET | Freq: Two times a day (BID) | ORAL | Status: AC
  Administered 2018-10-08 (×2): 40 meq via ORAL
  Filled 2018-10-08 (×2): qty 2

## 2018-10-08 MED ORDER — SODIUM CHLORIDE 0.9% FLUSH: 3.0000 mL | INTRAVENOUS | Status: DC | PRN

## 2018-10-08 NOTE — Progress Notes (Addendum)
Progress Note  Patient Name: Aaron [name] Date of Encounter: 10/08/2018  Primary Cardiologist: Jodelle Red, MD   Subjective   No significant overnight events. Breathing and lower extremity edema continue to improve. Patient was able to lay flat some last night. No chest pain or palpitations. He is a little anxious about the left/right heart catheterization tomorrow. I answered any questions he had about the procedure. No other complaints at this time.  Inpatient Medications    Scheduled Meds: . carvedilol  6.25 mg Oral BID WC  . furosemide  40 mg Intravenous TID  . mouth rinse  15 mL Mouth Rinse BID  . nicotine  21 mg Transdermal Daily   Continuous Infusions:  PRN Meds: traMADol   Vital Signs    Vitals:   10/07/18 0427 10/07/18 1321 10/07/18 2027 10/08/18 0423  BP: 116/76 98/79 92/76  107/76  Pulse: 97 87 88 82  Resp: 20 18 18 18   Temp: 97.8 F (36.6 C) 97.8 F (36.6 C) 98.6 F (37 C) 97.9 F (36.6 C)  TempSrc: Oral Oral Oral Oral  SpO2: 92% 96% 95% 96%  Weight: (!) 138.6 kg   (!) 138.3 kg  Height:        Intake/Output Summary (Last 24 hours) at 10/08/2018 1052 Last data filed at 10/08/2018 0513 Gross per 24 hour  Intake 360 ml  Output 1550 ml  Net -1190 ml   Filed Weights   10/05/18 0500 10/07/18 0427 10/08/18 0423  Weight: (!) 144 kg (!) 138.6 kg (!) 138.3 kg    Telemetry    Sinus rhythm with heart rates in the 70's to 90's and  multiple PVCs. Short runs of non-sustained VT with the longest one being 4 (improved since increasing dose of Coreg). - Personally Reviewed  ECG    No new ECG tracing today. - Personally Reviewed  Physical Exam   GEN: Caucasian male resting comfortably. Alert and in no acute distress.   Neck: Supple. Minimal JVD noted, improving. Cardiac: RRR. No significant murmurs, gallops, or rubs. Radial pulses 2+ and equal bilaterally.  Respiratory: No significant increased work of breathing. Clear to ausculation bilaterally.  GI: Abdomen soft, obese/mildly distended, and non-tender. Bowel sounds present. MS: Non-pitting edema of bilateral lower extremities. No deformity. Skin: Warm and dry. Neuro:  No focal deficits. Psych: Normal affect. Responds appropriately.  Labs    Chemistry Recent Labs  Lab 10/03/18 0517  10/06/18 0527 10/07/18 0534 10/08/18 0604  NA 139   < > 143 140 139  K 4.0   < > 3.6 3.3* 3.4*  CL 107   < > 108 105 103  CO2 20*   < > 26 30 30   GLUCOSE 118*   < > 104* 103* 103*  BUN 46*   < > 24* 22* 21*  CREATININE 1.13   < > 0.69 0.69 0.70  CALCIUM 8.1*   < > 7.7* 7.7* 7.8*  PROT 5.8*  --  5.2*  --  5.5*  ALBUMIN 2.7*  --  2.1*  --  2.2*  AST 57*  --  52*  --  62*  ALT 154*  --  87*  --  82*  ALKPHOS 138*  --  108  --  124  BILITOT 4.3*  --  2.9*  --  2.4*  GFRNONAA >60   < > >60 >60 >60  GFRAA >60   < > >60 >60 >60  ANIONGAP 12   < > 9 5 6    < > =  values in this interval not displayed.     Hematology Recent Labs  Lab 10/01/18 1336 10/02/18 0527 10/08/18 0604  WBC 13.5* 12.7* 9.0  RBC 5.17 5.34 4.41  HGB 15.1 15.2 12.8*  HCT 49.3 52.2* 41.8  MCV 95.4 97.8 94.8  MCH 29.2 28.5 29.0  MCHC 30.6 29.1* 30.6  RDW 17.4* 17.6* 16.7*  PLT 203 170 141*    Cardiac EnzymesNo results for input(s): TROPONINI in the last 168 hours. No results for input(s): TROPIPOC in the last 168 hours.   BNP Recent Labs  Lab 10/01/18 1337 10/01/18 1720  BNP 2,769.2* 2,333.1*     DDimer No results for input(s): DDIMER in the last 168 hours.   Radiology    No results found.  Cardiac Studies   Echocardiogram 10/02/2018: Impression: 1. The left ventricle has a visually estimated ejection fraction of. The cavity size was severely dilated. Left ventricular diastolic Doppler parameters are consistent with restrictive filling Left ventrical global hypokinesis without regional wall  motion abnormalities.  2. Diffuse hypokinesis EF 15% Given severithy of LV dysfunction consider CHF team  consult.  3. The right ventricle has normal systolic function. The cavity was normal. There is no increase in right ventricular wall thickness.  4. Left atrial size was moderately dilated.  5. The mitral valve is normal in structure. Mild thickening of the mitral valve leaflet. Mitral valve regurgitation is moderate by color flow Doppler.  6. The tricuspid valve is normal in structure.  7. The aortic valve is tricuspid Mild thickening of the aortic valve Aortic valve regurgitation is mild by color flow Doppler.  8. The pulmonic valve was grossly normal. Pulmonic valve regurgitation is mild by color flow Doppler.  Patient Profile   Aaron [name] is a 53 y.o. male with a history of obesity who was admitted on 10/01/2018 for bilateral lower extremity edema. Cardiology was consulted for acute CHF.  Assessment & Plan    Acute Systolic CHF - Echo showed LVEF of 15% with global hypokinesis but no regional wall motion abnormalities.  - BNP 2, 769.2 on admission. - Diuresing well on IV Lasix. Documented output of 1.5 L in the past 24 hours with net negative 11.2 L since admission. Weight 305 lbs today, down from 333 on admission. Renal function stable. - Will decrease IV Lasix 40mg  from three times daily to twice daily.  - Continue Coreg 6.25 twice daily. - May start Entresto after R/L heart catheterization. - Continue to monitor daily weight, strict I/O's, and renal function.  - Plan is to to proceed with left/right heart catheterization tomorrow at 10:30am with Dr. Kirke Corin. Patient was consented yesterday and orders were placed. Will make sure patient is NPO at midnight.   Non-Sustained VT - A few short episodes of non-sustained VT. Longest run being 21 beats on 10/05/2018.  Patient asymptomatic with this. These runs improved some with increase dose of Coreg. - Potassium 3.4 today. Goal > 4.0.  - Magnesium 2.1 on 10/05/2018. - TSH minimally elevated at 4.716. Free T4 normal. - Continue Coreg as above.  Maximize dose as able.  Hypokalemia - Potassium was 3.3 yesterday. He received one dose K-Dur 40 mEq yesterday and potassium 3.4 today. Will order K-Dur 40 mEq twice daily for today and recheck BMET in the morning.    For questions or updates, please contact [facility] Please consult [email or web address] for contact info under Cardiology/STEMI.      Signed, Corrin Parker, PA-C  10/08/2018, 10:52 AM    ---------------------------------------------------------------------------------------------  History and all data above reviewed.  Patient examined.  I agree with the findings as above.  Tarvis Mangels continues to improve from volume standpoint. We discussed HF education today.   Constitutional: No acute distress Cardiovascular: regular rhythm, normal rate, no murmurs. S1 and S2 normal. Radial pulses 4/4 bilaterally. No jugular venous distention.  Respiratory: clear to auscultation bilaterally GI : normal bowel sounds, soft and nontender. No distention.   MSK: extremities warm, well perfused. Bilateral 1+ edema.  NEURO: grossly nonfocal exam, moves all extremities. PSYCH: alert and oriented x 3, normal mood and affect.   All available labs, radiology testing, previous records reviewed. Agree with documented assessment and plan of my colleague as stated above with the following additions or changes:  Principal Problem:   Acute systolic CHF (congestive heart failure) (HCC) Active Problems:   Anasarca   Elevated LFTs   AKI (acute kidney injury) (HCC)   Acute hypoxemic respiratory failure (HCC)    Plan: will plan for right and left heart cath tomorrow. He is diuresing well. Will get a better sense of nature of HF tomorrow and make further decisions about HF therapy.  Lasix 40 IV BID.   INFORMED CONSENT: I have reviewed the risks, indications, and alternatives to cardiac catheterization, possible angioplasty, and stenting with the patient. Risks include but are not limited to  bleeding, infection, vascular injury, stroke, myocardial infection, arrhythmia, kidney injury, radiation-related injury in the case of prolonged fluoroscopy use, emergency cardiac surgery, and death. The patient understands the risks of serious complication is 1-2 in 1000 with diagnostic cardiac cath and 1-2% or less with angioplasty/stenting. With right heart catheterization, risk of pneumothorax, and arrhythmia reviewed.  Time Spent Directly with Patient:  I have spent a total of 35 minutes with the patient reviewing hospital notes, telemetry, EKGs, labs and examining the patient as well as establishing an assessment and plan that was discussed personally with the patient.  > 50% of time was spent in direct patient care.  Length of Stay:  LOS: 7 days   Parke Poisson, MD [facility] 4:23 PM  10/08/2018

## 2018-10-08 NOTE — Progress Notes (Signed)
Physical Therapy Treatment Patient Details Name: Aaron [name] MRN: [id number] DOB: 05/03/1966 Today's Date: 10/08/2018    History of Present Illness 53 y.o. 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 New onset of anasacra and CHF with EF of 15%.     PT Comments    Progressing well with mobility. O2 sat 89% on 2L [place] during ambulation. Pt prefers to not sit in recliner since he had significant difficulty getting out of it on yesterday.   Follow Up Recommendations  Home health PT (pt and wife declined placement)     Equipment Recommendations  None recommended by PT    Recommendations for Other Services       Precautions / Restrictions Precautions Precautions: Fall Precaution Comments: monitor O2 Restrictions Weight Bearing Restrictions: No    Mobility  Bed Mobility Overal bed mobility: Needs Assistance Bed Mobility: Supine to Sit;Sit to Supine     Supine to sit: Min guard;HOB elevated Sit to supine: Min assist;HOB elevated   General bed mobility comments: Assist for LEs onto bed. Pt relied on bedrail.   Transfers Overall transfer level: Needs assistance Equipment used: Rolling walker (2 wheeled) Transfers: Sit to/from Stand Sit to Stand: Min assist;From elevated surface         General transfer comment: Small amount of assist. VCs safety, technique, hand placement. Pt prefers to pull up on the walker.   Ambulation/Gait Ambulation/Gait assistance: Min assist Gait Distance (Feet): 15 Feet(x2) Assistive device: Rolling walker (2 wheeled) Gait Pattern/deviations: Step-through pattern;Decreased stride length     General Gait Details: Small amount of assist to steady. Remained on 2L [place]-89%. Dyspnea 2/4   Stairs             Wheelchair Mobility     Modified Rankin (Stroke Patients Only)       Balance Overall balance assessment: Needs assistance         Standing balance support: Bilateral upper extremity supported Standing balance-Leahy Scale: Poor                              Cognition Arousal/Alertness: Awake/alert Behavior During Therapy: WFL for tasks assessed/performed Overall Cognitive Status: Within Functional Limits for tasks assessed                                        Exercises      General Comments        Pertinent Vitals/Pain Pain Assessment: No/denies pain    Home Living                      Prior Function            PT Goals (current goals can now be found in the care plan section) Progress towards PT goals: Progressing toward goals    Frequency    Min 3X/week      PT Plan Current plan remains appropriate    Co-evaluation              AM-PAC PT "6 Clicks" Mobility   Outcome Measure  Help needed turning from your back to your side while in a flat bed without using bedrails?: A  Little Help needed moving from lying on your back to sitting on the side of a flat bed without using bedrails?: A Little Help needed moving to and from a bed to a chair (including a wheelchair)?: A Little Help needed standing up from a chair using your arms (e.g., wheelchair or bedside chair)?: A Little Help needed to walk in hospital room?: A Little Help needed climbing 3-5 steps with a railing? : A Lot 6 Click Score: 17    End of Session Equipment Utilized During Treatment: Oxygen Activity Tolerance: Patient tolerated treatment well Patient left: in bed;with bed alarm set;with family/visitor present;with call bell/phone within reach   PT Visit Diagnosis: Muscle weakness (generalized) (M62.81);Difficulty in walking, not elsewhere classified (R26.2)     Time: 1000-1020 PT Time Calculation (min) (ACUTE ONLY): 20 min  Charges:  $Gait Training: 8-22  mins                       Rebeca Alert, PT Acute Rehabilitation Services Pager: [phone] Office: [phone]

## 2018-10-08 NOTE — Progress Notes (Signed)
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 He was also found to have elevated liver enzymes as well as acute kidney injury  Assessment & Plan:   Principal Problem:   Acute systolic CHF (congestive heart failure) (HCC) Active Problems:   Anasarca   Elevated LFTs   AKI (acute kidney injury) (HCC)   Acute hypoxemic respiratory failure (HCC)   Acute hypoxic respiratory failure due to acute systolic CHF with anasarca -Patient's respiratory status improving with diuresis, continue Lasix per cardiology 40 mg 2 times a day starting today. -2D echo done on 10/02/2018 showed an severely depressed EF of 15% which is new for the patient. Cardiology was consulted and are following, for now optimize fluid status and will need cardiac catheterization at [facility] hopefully next week -He is net -11.2 L, renal function is stable Plan to have right and left heart cath tomorrow.  Intermittent VT/SVT -Started on low-dose Coreg  Active Problems Elevated liver enzymes  -Likely in the setting of congestive hepatopathy due to heart failure.  Hypokalemia - replete with goal 4.0 replete.  K is 3.4 this morning.  Acute kidney injury -Resolved, creatinine now within normal limits, monitor while on diuresis  Leukocytosis/concern for pneumonia -No upper respiratory symptoms, afebrile, no productive cough. Continue to monitor off antibiotics  DVT prophylaxis:SCDs Code Status:Full code Family Communication:Wife present at bedside Disposition Plan:TBD  Procedures:  2D echo: IMPRESSIONS 1. The left  ventricle has a visually estimated ejection fraction of. The cavity size was severely dilated. Left ventricular diastolic Doppler parameters are consistent with restrictive filling Left ventrical global hypokinesis without regional wall  motion abnormalities. 2. Diffuse hypokinesis EF 15% Given severithy of LV dysfunction consider CHF team consult. 3. The right ventricle has normal systolic function. The cavity was normal. There is no increase in right ventricular wall thickness. 4. Left atrial size was moderately dilated. 5. The mitral valve is normal in structure. Mild thickening of the mitral valve leaflet. Mitral valve regurgitation is moderate by color flow Doppler. 6. The tricuspid valve is normal in structure. 7. The aortic valve is tricuspid Mild thickening of the aortic valve Aortic valve regurgitation is mild by color flow Doppler. 8. The pulmonic valve was grossly normal. Pulmonic valve regurgitation is mild by color flow Doppler.  Antimicrobials none   Consultation with cardiology Estimated body mass index is 37.13 kg/m as calculated from the following:   Height as of this encounter: 6\' 4"  (1.93 m).   Weight as of this encounter: 138.3 kg.   Subjective: Patient resting in bed he feels more weight is come out overnight breathing is better anxious to go for catheterization tomorrow. Objective: Vitals:   10/07/18 0427 10/07/18 1321 10/07/18 2027 10/08/18 0423  BP: 116/76 98/79 92/76  107/76  Pulse: 97 87 88 82  Resp: 20 18 18 18   Temp: 97.8 F (36.6 C) 97.8 F (36.6 C) 98.6 F (37 C) 97.9 F (36.6 C)  TempSrc: Oral Oral Oral Oral  SpO2: 92% 96% 95% 96%  Weight: (!) 138.6 kg   (!) 138.3 kg  Height:        Intake/Output Summary (Last 24  hours) at 10/08/2018 1222 Last data filed at 10/08/2018 0513 Gross per 24 hour  Intake 360 ml  Output 1550 ml  Net -1190 ml   Filed Weights   10/05/18 0500 10/07/18 0427 10/08/18 0423  Weight: (!) 144 kg (!) 138.6 kg (!) 138.3  kg    Examination:  General exam: Appears calm and comfortable  Respiratory system: Clear to auscultation. Respiratory effort normal. Cardiovascular system: S1 & S2 heard, RRR. No JVD, murmurs, rubs, gallops or clicks. No pedal edema. Gastrointestinal system: Abdomen is nondistended, soft and nontender. No organomegaly or masses felt. Normal bowel sounds heard. Central nervous system: Alert and oriented. No focal neurological deficits. Extremities: Symmetric 5 x 5 power. Skin: No rashes, lesions or ulcers Psychiatry: Judgement and insight appear normal. Mood & affect appropriate.     Data Reviewed: I have personally reviewed following labs and imaging studies  CBC: Recent Labs  Lab 10/01/18 1336 10/02/18 0527 10/08/18 0604  WBC 13.5* 12.7* 9.0  NEUTROABS 11.4*  --   --   HGB 15.1 15.2 12.8*  HCT 49.3 52.2* 41.8  MCV 95.4 97.8 94.8  PLT 203 170 141*   Basic Metabolic Panel: Recent Labs  Lab 10/04/18 0535 10/05/18 0448 10/05/18 1241 10/06/18 0527 10/07/18 0534 10/08/18 0604  NA 140 145  --  143 140 139  K 3.4* 3.6  --  3.6 3.3* 3.4*  CL 108 107  --  108 105 103  CO2 26 29  --  26 30 30   GLUCOSE 136* 115*  --  104* 103* 103*  BUN 38* 29*  --  24* 22* 21*  CREATININE 0.95 0.72  --  0.69 0.69 0.70  CALCIUM 8.0* 8.0*  --  7.7* 7.7* 7.8*  MG  --   --  2.1  --   --   --    GFR: Estimated Creatinine Clearance: 160.4 mL/min (by C-G formula based on SCr of 0.7 mg/dL). Liver Function Tests: Recent Labs  Lab 10/01/18 1336 10/02/18 0527 10/03/18 0517 10/06/18 0527 10/08/18 0604  AST 110* 81* 57* 52* 62*  ALT 268* 219* 154* 87* 82*  ALKPHOS 153* 144* 138* 108 124  BILITOT 6.2* 5.2* 4.3* 2.9* 2.4*  PROT 6.4* 6.4* 5.8* 5.2* 5.5*  ALBUMIN 3.3* 3.2* 2.7* 2.1* 2.2*   Recent Labs  Lab 10/01/18 1336  LIPASE 46   Recent Labs  Lab 10/01/18 1337  AMMONIA 15   Coagulation Profile: Recent Labs  Lab 10/01/18 1353 10/02/18 0527  INR 1.4* 1.4*   Cardiac Enzymes:  No results for input(s): CKTOTAL, CKMB, CKMBINDEX, TROPONINI in the last 168 hours. BNP (last 3 results) No results for input(s): PROBNP in the last 8760 hours. HbA1C: No results for input(s): HGBA1C in the last 72 hours. CBG: No results for input(s): GLUCAP in the last 168 hours. Lipid Profile: No results for input(s): CHOL, HDL, LDLCALC, TRIG, CHOLHDL, LDLDIRECT in the last 72 hours. Thyroid Function Tests: Recent Labs    10/05/18 1241  FREET4 0.99   Anemia Panel: No results for input(s): VITAMINB12, FOLATE, FERRITIN, TIBC, IRON, RETICCTPCT in the last 72 hours. Sepsis Labs: No results for input(s): PROCALCITON, LATICACIDVEN in the last 168 hours.  No results found for this or any previous visit (from the past 240 hour(s)).       Radiology Studies: No results found.      Scheduled Meds: . carvedilol  6.25 mg Oral BID WC  . furosemide  40 mg Intravenous BID  . mouth rinse  15 mL Mouth Rinse BID  . nicotine  21 mg Transdermal Daily  . potassium chloride  40 mEq Oral BID   Continuous Infusions:   LOS: 7 days     Alwyn Ren, MD [facility]  If 7PM-7AM, please contact night-coverage [email or web address] Password [facility] 10/08/2018, 12:22 PM

## 2018-10-09 ENCOUNTER — Inpatient Hospital Stay: Payer: Self-pay

## 2018-10-09 ENCOUNTER — Encounter (HOSPITAL_COMMUNITY): Payer: Self-pay | Admitting: Cardiovascular Disease

## 2018-10-09 ENCOUNTER — Other Ambulatory Visit: Payer: Self-pay | Admitting: *Deleted

## 2018-10-09 ENCOUNTER — Inpatient Hospital Stay (HOSPITAL_COMMUNITY)
Admission: EM | Disposition: A | Discharge status: Rehabilitation Facility | Payer: Self-pay | Source: Home / Self Care | Attending: Internal Medicine

## 2018-10-09 DIAGNOSIS — I2510 Atherosclerotic heart disease of native coronary artery without angina pectoris: Secondary | ICD-10-CM

## 2018-10-09 DIAGNOSIS — I472 Ventricular tachycardia: Secondary | ICD-10-CM

## 2018-10-09 DIAGNOSIS — I25110 Atherosclerotic heart disease of native coronary artery with unstable angina pectoris: Secondary | ICD-10-CM

## 2018-10-09 DIAGNOSIS — I428 Other cardiomyopathies: Secondary | ICD-10-CM

## 2018-10-09 HISTORY — PX: RIGHT/LEFT HEART CATH AND CORONARY ANGIOGRAPHY: CATH118266

## 2018-10-09 LAB — CBC
HCT: 42.4 % (ref 39.0–52.0)
Hemoglobin: 13.4 g/dL (ref 13.0–17.0)
MCH: 29.2 pg (ref 26.0–34.0)
MCHC: 31.6 g/dL (ref 30.0–36.0)
MCV: 92.4 fL (ref 80.0–100.0)
PLATELETS: 182 10*3/uL (ref 150–400)
RBC: 4.59 MIL/uL (ref 4.22–5.81)
RDW: 16.9 % — ABNORMAL HIGH (ref 11.5–15.5)
WBC: 9.7 10*3/uL (ref 4.0–10.5)
nRBC: 0 % (ref 0.0–0.2)

## 2018-10-09 LAB — POCT I-STAT 7, (LYTES, BLD GAS, ICA,H+H)
ACID-BASE EXCESS: 4 mmol/L — AB (ref 0.0–2.0)
Bicarbonate: 28.7 mmol/L — ABNORMAL HIGH (ref 20.0–28.0)
Calcium, Ion: 1.09 mmol/L — ABNORMAL LOW (ref 1.15–1.40)
HCT: 39 % (ref 39.0–52.0)
Hemoglobin: 13.3 g/dL (ref 13.0–17.0)
O2 Saturation: 90 %
PH ART: 7.461 — AB (ref 7.350–7.450)
Potassium: 3.8 mmol/L (ref 3.5–5.1)
Sodium: 140 mmol/L (ref 135–145)
TCO2: 30 mmol/L (ref 22–32)
pCO2 arterial: 40.3 mmHg (ref 32.0–48.0)
pO2, Arterial: 56 mmHg — ABNORMAL LOW (ref 83.0–108.0)

## 2018-10-09 LAB — POCT I-STAT EG7
Acid-Base Excess: 7 mmol/L — ABNORMAL HIGH (ref 0.0–2.0)
Bicarbonate: 31.3 mmol/L — ABNORMAL HIGH (ref 20.0–28.0)
Calcium, Ion: 1.14 mmol/L — ABNORMAL LOW (ref 1.15–1.40)
HEMATOCRIT: 40 % (ref 39.0–52.0)
HEMOGLOBIN: 13.6 g/dL (ref 13.0–17.0)
O2 Saturation: 56 %
POTASSIUM: 3.8 mmol/L (ref 3.5–5.1)
Sodium: 140 mmol/L (ref 135–145)
TCO2: 33 mmol/L — ABNORMAL HIGH (ref 22–32)
pCO2, Ven: 42.6 mmHg — ABNORMAL LOW (ref 44.0–60.0)
pH, Ven: 7.474 — ABNORMAL HIGH (ref 7.250–7.430)
pO2, Ven: 27 mmHg — CL (ref 32.0–45.0)

## 2018-10-09 LAB — BASIC METABOLIC PANEL
Anion gap: 8 (ref 5–15)
BUN: 20 mg/dL (ref 6–20)
CHLORIDE: 102 mmol/L (ref 98–111)
CO2: 29 mmol/L (ref 22–32)
Calcium: 7.9 mg/dL — ABNORMAL LOW (ref 8.9–10.3)
Creatinine, Ser: 0.69 mg/dL (ref 0.61–1.24)
GFR calc Af Amer: >60 mL/min (ref >60)
GFR calc non Af Amer: >60 mL/min (ref >60)
Glucose, Bld: 106 mg/dL — ABNORMAL HIGH (ref 70–99)
Potassium: 3.9 mmol/L (ref 3.5–5.1)
Sodium: 139 mmol/L (ref 135–145)

## 2018-10-09 LAB — CREATININE, SERUM
Creatinine, Ser: 0.76 mg/dL (ref 0.61–1.24)
GFR calc Af Amer: >60 mL/min (ref >60)
GFR calc non Af Amer: >60 mL/min (ref >60)

## 2018-10-09 LAB — COOXEMETRY PANEL
CARBOXYHEMOGLOBIN: 2.5 % — AB (ref 0.5–1.5)
Methemoglobin: 0.8 % (ref 0.0–1.5)
O2 Saturation: 57.3 %
Total hemoglobin: 13.1 g/dL (ref 12.0–16.0)

## 2018-10-09 SURGERY — RIGHT/LEFT HEART CATH AND CORONARY ANGIOGRAPHY
Anesthesia: LOCAL

## 2018-10-09 MED ORDER — HEPARIN SODIUM (PORCINE) 1000 UNIT/ML IJ SOLN
INTRAMUSCULAR | Status: AC
  Filled 2018-10-09: qty 1

## 2018-10-09 MED ORDER — LIDOCAINE HCL (PF) 1 % IJ SOLN
INTRAMUSCULAR | Status: AC
  Filled 2018-10-09: qty 30

## 2018-10-09 MED ORDER — IOHEXOL 350 MG/ML SOLN
INTRAVENOUS | Status: DC | PRN
  Administered 2018-10-09: 50 mL via INTRACARDIAC

## 2018-10-09 MED ORDER — VERAPAMIL HCL 2.5 MG/ML IV SOLN
INTRAVENOUS | Status: DC | PRN
  Administered 2018-10-09: 10 mL via INTRA_ARTERIAL

## 2018-10-09 MED ORDER — LIDOCAINE HCL (PF) 1 % IJ SOLN
INTRAMUSCULAR | Status: DC | PRN
  Administered 2018-10-09 (×2): 2 mL

## 2018-10-09 MED ORDER — HEPARIN (PORCINE) IN NACL 1000-0.9 UT/500ML-% IV SOLN
INTRAVENOUS | Status: DC | PRN
  Administered 2018-10-09 (×2): 500 mL

## 2018-10-09 MED ORDER — VERAPAMIL HCL 2.5 MG/ML IV SOLN
INTRAVENOUS | Status: AC
  Filled 2018-10-09: qty 2

## 2018-10-09 MED ORDER — ENOXAPARIN SODIUM 40 MG/0.4ML ~~LOC~~ SOLN
40.0000 mg | SUBCUTANEOUS | Status: DC
  Administered 2018-10-10 – 2018-10-11 (×2): 40 mg via SUBCUTANEOUS
  Filled 2018-10-09 (×2): qty 0.4

## 2018-10-09 MED ORDER — MILRINONE LACTATE IN DEXTROSE 20-5 MG/100ML-% IV SOLN
0.2500 ug/kg/min | INTRAVENOUS | Status: DC
  Administered 2018-10-09 – 2018-10-11 (×5): 0.25 ug/kg/min via INTRAVENOUS
  Filled 2018-10-09 (×6): qty 100

## 2018-10-09 MED ORDER — MIDAZOLAM HCL 2 MG/2ML IJ SOLN
INTRAMUSCULAR | Status: AC
  Filled 2018-10-09: qty 2

## 2018-10-09 MED ORDER — MIDAZOLAM HCL 2 MG/2ML IJ SOLN
INTRAMUSCULAR | Status: DC | PRN
  Administered 2018-10-09: 1 mg via INTRAVENOUS

## 2018-10-09 MED ORDER — CHLORHEXIDINE GLUCONATE CLOTH 2 % EX PADS
6.0000 | MEDICATED_PAD | Freq: Every day | CUTANEOUS | Status: DC
  Administered 2018-10-09 – 2018-10-19 (×7): 6 via TOPICAL

## 2018-10-09 MED ORDER — HEPARIN (PORCINE) IN NACL 1000-0.9 UT/500ML-% IV SOLN
INTRAVENOUS | Status: AC
  Filled 2018-10-09: qty 1000

## 2018-10-09 MED ORDER — SODIUM CHLORIDE 0.9% FLUSH
3.0000 mL | Freq: Two times a day (BID) | INTRAVENOUS | Status: DC
  Administered 2018-10-09 – 2018-10-30 (×9): 3 mL via INTRAVENOUS
  Administered 2018-11-01: 10 mL via INTRAVENOUS
  Administered 2018-11-02 – 2018-11-05 (×3): 3 mL via INTRAVENOUS

## 2018-10-09 MED ORDER — FUROSEMIDE 10 MG/ML IJ SOLN
80.0000 mg | Freq: Two times a day (BID) | INTRAMUSCULAR | Status: DC
  Administered 2018-10-09 – 2018-10-11 (×4): 80 mg via INTRAVENOUS
  Filled 2018-10-09 (×4): qty 8

## 2018-10-09 MED ORDER — ONDANSETRON HCL 4 MG/2ML IJ SOLN
4.0000 mg | Freq: Four times a day (QID) | INTRAMUSCULAR | Status: DC | PRN
  Administered 2018-10-12 – 2018-10-27 (×8): 4 mg via INTRAVENOUS
  Filled 2018-10-09 (×8): qty 2

## 2018-10-09 MED ORDER — SPIRONOLACTONE 12.5 MG HALF TABLET
12.5000 mg | ORAL_TABLET | Freq: Every day | ORAL | Status: DC
  Administered 2018-10-09 – 2018-10-14 (×6): 12.5 mg via ORAL
  Filled 2018-10-09 (×7): qty 1

## 2018-10-09 MED ORDER — ATORVASTATIN CALCIUM 80 MG PO TABS
80.0000 mg | ORAL_TABLET | Freq: Every day | ORAL | Status: DC
  Administered 2018-10-09 – 2018-10-15 (×7): 80 mg via ORAL
  Filled 2018-10-09 (×8): qty 1

## 2018-10-09 MED ORDER — HEPARIN (PORCINE) IN NACL 1000-0.9 UT/500ML-% IV SOLN
INTRAVENOUS | Status: AC
  Filled 2018-10-09: qty 500

## 2018-10-09 MED ORDER — HEPARIN SODIUM (PORCINE) 1000 UNIT/ML IJ SOLN
INTRAMUSCULAR | Status: DC | PRN
  Administered 2018-10-09: 5000 [IU] via INTRAVENOUS

## 2018-10-09 MED ORDER — SODIUM CHLORIDE 0.9% FLUSH
10.0000 mL | INTRAVENOUS | Status: DC | PRN
  Administered 2018-11-04: 15:00:00 10 mL
  Filled 2018-10-09: qty 40

## 2018-10-09 MED ORDER — DIGOXIN 125 MCG PO TABS
0.1250 mg | ORAL_TABLET | Freq: Every day | ORAL | Status: DC
  Administered 2018-10-09 – 2018-10-15 (×7): 0.125 mg via ORAL
  Filled 2018-10-09 (×7): qty 1

## 2018-10-09 MED ORDER — ASPIRIN 81 MG PO CHEW
81.0000 mg | CHEWABLE_TABLET | Freq: Every day | ORAL | Status: DC
  Administered 2018-10-10 – 2018-10-15 (×6): 81 mg via ORAL
  Filled 2018-10-09 (×6): qty 1

## 2018-10-09 MED ORDER — ACETAMINOPHEN 325 MG PO TABS: 650.0000 mg | ORAL_TABLET | ORAL | Status: DC | PRN

## 2018-10-09 MED ORDER — SODIUM CHLORIDE 0.9% FLUSH
10.0000 mL | Freq: Two times a day (BID) | INTRAVENOUS | Status: DC
  Administered 2018-10-09 – 2018-10-14 (×4): 10 mL
  Administered 2018-10-16: 20 mL
  Administered 2018-10-16: 10 mL
  Administered 2018-10-16: 20 mL
  Administered 2018-10-17 – 2018-10-21 (×5): 10 mL
  Administered 2018-10-21: 20 mL
  Administered 2018-10-22 – 2018-10-27 (×8): 10 mL
  Administered 2018-10-28: 20 mL
  Administered 2018-10-28 – 2018-10-31 (×5): 10 mL
  Administered 2018-10-31: 20 mL
  Administered 2018-11-01: 10 mL
  Administered 2018-11-01: 20 mL
  Administered 2018-11-02 – 2018-11-03 (×4): 10 mL
  Administered 2018-11-04: 20 mL
  Administered 2018-11-05: 10 mL

## 2018-10-09 MED ORDER — SODIUM CHLORIDE 0.9 % IV SOLN
250.0000 mL | INTRAVENOUS | Status: DC | PRN
  Administered 2018-10-27: 250 mL via INTRAVENOUS
  Administered 2018-11-03: 20 mL via INTRAVENOUS

## 2018-10-09 MED ORDER — SODIUM CHLORIDE 0.9% FLUSH: 3.0000 mL | INTRAVENOUS | Status: DC | PRN

## 2018-10-09 SURGICAL SUPPLY — 11 items
CATH BALLN WEDGE 5F 110CM (CATHETERS) ×2 IMPLANT
CATH INFINITI 5FR JK (CATHETERS) ×2 IMPLANT
DEVICE RAD COMP TR BAND LRG (VASCULAR PRODUCTS) ×2 IMPLANT
GLIDESHEATH SLEND SS 6F .021 (SHEATH) ×2 IMPLANT
GUIDEWIRE INQWIRE 1.5J.035X260 (WIRE) ×1 IMPLANT
INQWIRE 1.5J .035X260CM (WIRE) ×2
KIT HEART LEFT (KITS) ×2 IMPLANT
PACK CARDIAC CATHETERIZATION (CUSTOM PROCEDURE TRAY) ×2 IMPLANT
SHEATH GLIDE SLENDER 4/5FR (SHEATH) ×2 IMPLANT
TRANSDUCER W/STOPCOCK (MISCELLANEOUS) ×2 IMPLANT
TUBING CIL FLEX 10 FLL-RA (TUBING) ×2 IMPLANT

## 2018-10-09 NOTE — Interval H&P Note (Signed)
History and Physical Interval Note:  10/09/2018 10:33 AM  Aaron [name]  has presented today for surgery, with the diagnosis of Heart Failure.  The various methods of treatment have been discussed with the patient and family. After consideration of risks, benefits and other options for treatment, the patient has consented to  Procedure(s): RIGHT/LEFT HEART CATH AND CORONARY ANGIOGRAPHY (N/A) as a surgical intervention.  The patient's history has been reviewed, patient examined, no change in status, stable for surgery.  I have reviewed the patient's chart and labs.  Questions were answered to the patient's satisfaction.     Lorine Bears

## 2018-10-09 NOTE — Progress Notes (Signed)
CSW following to assist patient with discharge plan. Patient at this time is not medically stable for discharge. Patient has been approved for charity home health through [place]. [employer] representative Alvino Chapel [phone]) is following patient for medical readiness.   Marrianne Mood, MSW,  Theresia Majors [phone]

## 2018-10-09 NOTE — Progress Notes (Signed)
 Progress Note  Patient Name: Aaron [name] Date of Encounter: 10/09/2018  Primary Cardiologist: Bridgette Christopher, MD   Subjective   Feels well, nervous about cath, we discussed again.   Inpatient Medications    Scheduled Meds: . carvedilol  6.25 mg Oral BID WC  . furosemide  40 mg Intravenous BID  . mouth rinse  15 mL Mouth Rinse BID  . nicotine  21 mg Transdermal Daily  . sodium chloride flush  3 mL Intravenous Q12H   Continuous Infusions: . sodium chloride    . sodium chloride 10 mL/hr at 10/09/18 0600   PRN Meds: sodium chloride, sodium chloride flush, traMADol   Vital Signs    Vitals:   10/08/18 1258 10/08/18 1327 10/08/18 2244 10/09/18 0502  BP: 104/64  104/75 110/73  Pulse: 79  83 86  Resp:   20 18  Temp:  98.5 F (36.9 C) 98.3 F (36.8 C) 97.6 F (36.4 C)  TempSrc:  Oral Oral Oral  SpO2: 96%  96% 92%  Weight:    (!) 138.1 kg  Height:        Intake/Output Summary (Last 24 hours) at 10/09/2018 0950 Last data filed at 10/09/2018 0912 Gross per 24 hour  Intake 1092.13 ml  Output 1400 ml  Net -307.87 ml   Filed Weights   10/07/18 0427 10/08/18 0423 10/09/18 0502  Weight: (!) 138.6 kg (!) 138.3 kg (!) 138.1 kg    Telemetry    <10 beats of NSVT - Personally Reviewed  ECG   no new  Physical Exam   GEN: No acute distress.   Neck: No JVD Cardiac: regular rhythm, normal rate, no murmurs, rubs, or gallops.  Respiratory: Clear to auscultation bilaterally. GI: Soft, nontender, non-distended  MS: bilateral edema; No deformity. Neuro:  Nonfocal  Psych: Normal affect   Labs    Chemistry Recent Labs  Lab 10/03/18 0517  10/06/18 0527 10/07/18 0534 10/08/18 0604 10/09/18 0418  NA 139   < > 143 140 139 139  K 4.0   < > 3.6 3.3* 3.4* 3.9  CL 107   < > 108 105 103 102  CO2 20*   < > 26 30 30 29  GLUCOSE 118*   < > 104* 103* 103* 106*  BUN 46*   < > 24* 22* 21* 20  CREATININE 1.13   < > 0.69 0.69 0.70 0.69  CALCIUM 8.1*   < > 7.7* 7.7*  7.8* 7.9*  PROT 5.8*  --  5.2*  --  5.5*  --   ALBUMIN 2.7*  --  2.1*  --  2.2*  --   AST 57*  --  52*  --  62*  --   ALT 154*  --  87*  --  82*  --   ALKPHOS 138*  --  108  --  124  --   BILITOT 4.3*  --  2.9*  --  2.4*  --   GFRNONAA >60   < > >60 >60 >60 >60  GFRAA >60   < > >60 >60 >60 >60  ANIONGAP 12   < > 9 5 6 8   < > = values in this interval not displayed.     Hematology Recent Labs  Lab 10/08/18 0604  WBC 9.0  RBC 4.41  HGB 12.8*  HCT 41.8  MCV 94.8  MCH 29.0  MCHC 30.6  RDW 16.7*  PLT 141*    Cardiac EnzymesNo results for input(s): TROPONINI in the   last 168 hours. No results for input(s): TROPIPOC in the last 168 hours.   BNPNo results for input(s): BNP, PROBNP in the last 168 hours.   DDimer No results for input(s): DDIMER in the last 168 hours.   Radiology    No results found.  Cardiac Studies     Patient Profile       Assessment & Plan   Principal Problem:   Acute systolic CHF (congestive heart failure) (HCC) Active Problems:   Anasarca   Elevated LFTs   AKI (acute kidney injury) (HCC)   Acute hypoxemic respiratory failure (HCC)   Acute Systolic CHF - Echo showed LVEF of 15% with global hypokinesis but no regional wall motion abnormalities.  - BNP 2, 769.2 on admission. - Diuresing well on IV Lasix. Negative 5 L since admission. Weight 303 lbs today, down from 333 on admission. Renal function stable. -  lasix 40 mg IV BID  - Continue Coreg 6.25 twice daily. - May start Entresto after R/L heart catheterization. - Continue to monitor daily weight, strict I/O's, and renal function. Cr stable. - Plan is to to proceed with left/right heart catheterization at 10:30am with Dr. Kirke Corin. Patient was consented yesterday and orders were placed.   Non-Sustained VT - A few short episodes of non-sustained VT. Longest run being 21 beats on 10/05/2018.  Patient asymptomatic with this. These runs improved some with increase dose of Coreg. - Potassium 3.9  today. Goal > 4.0.  - Magnesium 2.1 on 10/05/2018. - TSH minimally elevated at 4.716. Free T4 normal. - Continue Coreg as above. Maximize dose as able.  Hypokalemia Continue potassium supplementation while diuresing given NSVT.     For questions or updates, please contact [facility] Please consult [email or web address] for contact info under        Signed, Parke Poisson, MD  10/09/2018, 9:50 AM

## 2018-10-09 NOTE — Consult Note (Addendum)
[address]       [place] [address]             [phone]        Haiden Rawlinson [facility] Medical Record #[id number] Date of Birth: 1966/02/10  Referring: ARIDA Primary Care: [facility] [place] Primary Cardiologist:Bridgette Cristal Deer, MD  Chief Complaint:    Chief Complaint  Patient presents with   Leg Swelling    History of Present Illness:      Mr. Recupero is a 53 yo morbidly obese white male who denies previous medical problems.  He states that he was in his normal state of health when he was eating at [facility].  He states he developed epigastric pain that he felt was indigestion.  He took medication for it, but the pain persisted.  He also notices his legs and abdomen have been more swollen over the past several weeks.  This has been accompanied with intermittent diarrhea, which he took OTC medication for which did provide some relief.  His swelling persisted and made it difficult for him to ambulate.  Work up in the ED showed elevated BUN and creatinine at 1.4, elevated LFTs, elevated BNP of 2769.  CXR showed some atelectasis and CT of abdomen showed small pleural effusions and minimal ascites. He was also noted to have a right lower lobe infiltrate consistent with pneumonia.  He was admitted by Medicine service for possible liver disease vs cardiac source.  He has been treated with IV diuretics.  Echocardiogram was obtained and showed severely dilated LV with severe dysfunction with EF of 15%.  There was also moderate mitral regurgitation and mild aortic regurgitation.  Cardiology consulted and recommended more aggressive IV diuretic regimen. 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  He was treated medically until his situation stabilized.  He was taken for cardiac catheterization on 10/09/2018 and found to have multivessel CAD and it was felt coronary bypass grafting may ben indicated.  The patient is currently  chest pain free.  He denies any history of medical problems and states he actually lost over 100 lbs on his own with use of [employer].  He does not smoke but does use chewing tobacco.  He has seen a dentist and needs to have some work completed.    Current Activity/ Functional Status: Patient is independent with mobility/ambulation, transfers, ADL's, IADL's.   Zubrod Score: At the time of surgery this patients most appropriate activity status/level should be described as:     0    Normal activity, no symptoms     1    Restricted in physical strenuous activity but ambulatory, able to do out light work     2    Ambulatory and capable of self care, unable to do work activities, up and about                 more than 50%  Of the time                                3    Only limited self care, in bed greater than 50% of waking hours     4    Completely disabled, no self care, confined to bed or chair     5    Moribund  History reviewed. No pertinent past medical history.  Past Surgical History:  Procedure Laterality Date   RIGHT/LEFT HEART CATH AND CORONARY ANGIOGRAPHY N/A 10/09/2018   Procedure: RIGHT/LEFT HEART CATH AND CORONARY ANGIOGRAPHY;  Surgeon: Iran Ouch, MD;  Location: [facility] INVASIVE CV LAB;  Service: Cardiovascular;  Laterality: N/A;    Social History   Tobacco Use  Smoking Status Never Smoker  Smokeless Tobacco Never Used    Social History   Substance and Sexual Activity  Alcohol Use Never   Frequency: Never     No Known Allergies  Current Facility-Administered Medications  Medication Dose Route Frequency Provider Last Rate Last Dose   0.9 %  sodium chloride infusion  250 mL Intravenous PRN Iran Ouch, MD       acetaminophen (TYLENOL) tablet 650 mg  650 mg Oral Q4H PRN Iran Ouch, MD       [START ON 10/10/2018] aspirin chewable tablet 81 mg  81 mg Oral Daily Lorine Bears A, MD       atorvastatin (LIPITOR) tablet 80 mg  80  mg Oral q1800 Iran Ouch, MD       [START ON 10/10/2018] enoxaparin (LOVENOX) injection 40 mg  40 mg Subcutaneous Q24H Lorine Bears A, MD       furosemide (LASIX) injection 80 mg  80 mg Intravenous BID Graciella Freer, PA-C       MEDLINE mouth rinse  15 mL Mouth Rinse BID Lorine Bears A, MD   15 mL at 10/09/18 0913   milrinone (PRIMACOR) 20 MG/100 ML (0.2 mg/mL) infusion  0.25 mcg/kg/min Intravenous Continuous Lorine Bears A, MD 10.36 mL/hr at 10/09/18 1249 0.25 mcg/kg/min at 10/09/18 1249   nicotine (NICODERM CQ - dosed in mg/24 hours) patch 21 mg  21 mg Transdermal Daily Lorine Bears A, MD   21 mg at 10/09/18 0915   ondansetron (ZOFRAN) injection 4 mg  4 mg Intravenous Q6H PRN Iran Ouch, MD       sodium chloride flush (NS) 0.9 % injection 3 mL  3 mL Intravenous Q12H Arida, Muhammad A, MD       sodium chloride flush (NS) 0.9 % injection 3 mL  3 mL Intravenous PRN Iran Ouch, MD       spironolactone (ALDACTONE) tablet 12.5 mg  12.5 mg Oral QHS Graciella Freer, PA-C       traMADol Janean Sark) tablet 25 mg  25 mg Oral Q6H PRN Iran Ouch, MD   25 mg at 10/01/18 1859    No medications prior to admission.    History reviewed. No pertinent family history.   Review of Systems:   Review of Systems  Constitutional:       Weight gain recently  Respiratory: Positive for cough and shortness of breath.   Cardiovascular: Positive for orthopnea, leg swelling and PND. Negative for claudication.  Gastrointestinal: Positive for heartburn.      Cardiac Review of Systems: Y or  [    ]= no  Chest Pain [  N  ]  Resting SOB [ N  ] Exertional SOB  [N  ]  Orthopnea [  ]   Pedal Edema [ Y  ]    Palpitations [  ] Syncope  [  ]   Presyncope [   ]  General Review of Systems: [Y] = yes [  ]=no Constitional: recent weight change [ Y ]; anorexia [  ]; fatigue [  ]; nausea [  ]; night sweats [  ]; fever [  ]; or chills [  ]  Dental: Last Dentist visit:   Eye : blurred vision [  ]; diplopia [   ]; vision changes [  ];  Amaurosis fugax[  ]; Resp: cough [  ];  wheezing[  ];  hemoptysis[  ]; shortness of breath[N  ]; paroxysmal nocturnal dyspnea[  ]; dyspnea on exertion[  ]; or orthopnea[  ];  GI:  gallstones[  ], vomiting[  ];  dysphagia[  ]; melena[  ];  hematochezia [  ]; heartburn[  ];   Hx of  Colonoscopy[  ]; GU: kidney stones [  ]; hematuria[  ];   dysuria [  ];  nocturia[  ];  history of     obstruction [  ]; urinary frequency [  ]             Skin: rash, swelling[  ];, hair loss[  ];  peripheral edema[ Y ];  or itching[  ]; Musculosketetal: myalgias[  ];  joint swelling[  ];  joint erythema[  ];  joint pain[  ];  back pain[  ];  Heme/Lymph: bruising[ Y ];  bleeding[  ];  anemia[  ];  Neuro: TIA[  ];  headaches[  ];  stroke[  ];  vertigo[  ];  seizures[  ];   paresthesias[  ];  difficulty walking[ Y, due to swelling ];  Psych:depression[  ]; anxiety[  ];  Endocrine: diabetes[  ];  thyroid dysfunction[  ];  Physical Exam: BP 98/75    Pulse 79    Temp 97.6 F (36.4 C) (Oral)    Resp (!) 29    Ht 6\' 4"  (1.93 m)    Wt (!) 138.1 kg    SpO2 98%    BMI 37.06 kg/m    General appearance: alert, cooperative and morbidly obese Neck: no adenopathy, no carotid bruit, no JVD, supple, symmetrical, trachea midline and thyroid not enlarged, symmetric, no tenderness/mass/nodules Resp: diminished breath sounds bibasilar Cardio: regular rate and rhythm GI: soft, non-tender; bowel sounds normal; no masses,  no organomegaly Extremities: edema 1-2+ Neurologic: Grossly normal  Diagnostic Studies & Laboratory data:     Recent Radiology Findings:   [place] Ekg Site Rite  Result Date: 10/09/2018 If Site Rite image not attached, placement could not be confirmed due to current cardiac rhythm.    I have independently reviewed the above radiologic studies and discussed with the patient   Recent Lab  Findings: Lab Results  Component Value Date   WBC 9.7 10/09/2018   HGB 13.4 10/09/2018   HCT 42.4 10/09/2018   PLT 182 10/09/2018   GLUCOSE 106 (H) 10/09/2018   ALT 82 (H) 10/08/2018   AST 62 (H) 10/08/2018   NA 140 10/09/2018   K 3.8 10/09/2018   CL 102 10/09/2018   CREATININE 0.76 10/09/2018   BUN 20 10/09/2018   CO2 29 10/09/2018   TSH 4.716 (H) 10/01/2018   INR 1.4 (H) 10/02/2018    Assessment / Plan:      1. CAD- patient admitted with CHF with NICM- currently on Milrinone drip, patient is chest pain free 2. Chewing tobacco abuse 3. Dispo- patient with NICM, + CAD-- requesting CABG... patient is likely not a candidate for CABG currently.  He will require medical management of heart failure prior to being considered for CABG... Dr. Dorris Fetch will follow up with further recommendations.  I  spent 55 minutes counseling the patient face to face.  Lowella Dandy PA-C [phone] 10/09/2018 2:42 PM   Patient seen and examined, agree  with above. 53 yo man with no prior cardiac history presents with decompensated left and right ventricular systolic and diastolic heart failure. Ef 10-15%. 3 vessel CAD at cath. Moderate mid LAD disease, tight distal lesion beyond reach of IMA. Severe disease in circumflex. RCA totally occluded but very questionable target distally.   He is not a candidate for CABG at present.  Agree with plan for aggressive medical therapy of decompensated heart failure. He is on a milrinone infusion currently.   A viability study may be helpful once his heart failure improves, unless there is significant improvement in wall motion on echo.  Will follow.  Salvatore Decent Dorris Fetch, MD [facility] [phone]

## 2018-10-09 NOTE — Plan of Care (Signed)
  Problem: Education: Goal: Understanding of CV disease, CV risk reduction, and recovery process will improve Outcome: Progressing   Problem: Activity: Goal: Ability to return to baseline activity level will improve Outcome: Progressing   

## 2018-10-09 NOTE — Progress Notes (Signed)
Patient had a 6 beat run of vtach. Pt asymptomatic. VSS. [facility] notified. No new orders placed. Will continue to monitor closely

## 2018-10-09 NOTE — Consult Note (Addendum)
Advanced Heart Failure Team Consult Note   Primary Physician: [facility] [place] PCP-Cardiologist:  Jodelle Red, MD  Reason for Consultation: Acute systolic CHF  HPI:    Aaron [name] is seen today for evaluation of acute systolic CHF at the request of Dr. Kirke Corin.   Aaron [name] is a 53 y.o. male with h/o morbid obesity. He presented to [facility] 10/01/18 with complaints of peripheral edema, abdominal distention and bloating, and progressive fluid retention - feels he has gained over 100 pounds in the last few months. Pertinent labs on admission inlude BNP 2769, Cr 1.44, Mild TSH elevated at 4.716. WNC 13.5, normal Hgb, AST 110, ALT 268, total Bili 6.2. CXR showed L>R lung base opacity consistent with atelectasis, PNA, or a combination. No pulmonary edema. Abdominal CT showed minimal ascites and small bilateral pleural effusions + RLL infiltrate.   Negative 11.4 L this admission so far this admit. Weight down ~ 30 lbs if accurate.   Cath today with EF 20% severe 3v CAD and markedly decompensated hemodynamics with low output  Feeling better post cath, now that he knows what is going on. His wife and son are present. Pt was previously a paramedic, and wife continues to work as a Cabin crew, so they both have a good understanding of the terminology and his medical condition. Denies SOB or CP currently. Initially, they thought his symptoms were IBS related and were treating with OTC medications for his stomach.   Echo 10/02/18 LVEF 15%, Normal RV, Mod LAE, Mod MR, Mild AI, Mild PI.   R/LHC 10/09/18  Mid RCA lesion is 100% stenosed.  Prox RCA lesion is 70% stenosed.  There is severe left ventricular systolic dysfunction.  LV end diastolic pressure is moderately elevated.  The left ventricular ejection fraction is less than 25% by visual estimate.  Ost LM lesion is 20% stenosed.  Dist LAD lesion is 90% stenosed.  Mid LAD lesion is 80% stenosed.   Prox Cx to Mid Cx lesion is 99% stenosed. RHC Procedural Findings: Hemodynamics (mmHg) RA mean 12 RV 43/9 PA 47/21 PCWP 25 AO 95/73 Cardiac Output (Fick) 5.95 Cardiac Index (Fick) 2.25 PVR 1   Review of Systems: [y] = yes, [ ]  = no   . General: Weight gain [y]; Weight loss [ ] ; Anorexia [ ] ; Fatigue [y]; Fever [ ] ; Chills [ ] ; Weakness [ ]   . Cardiac: Chest pain/pressure [ ] ; Resting SOB [ ] ; Exertional SOB [y]; Orthopnea [y]; Pedal Edema [y]; Palpitations [ ] ; Syncope [ ] ; Presyncope [ ] ; Paroxysmal nocturnal dyspnea[ ]   . Pulmonary: Cough [ ] ; Wheezing[ ] ; Hemoptysis[ ] ; Sputum [ ] ; Snoring [ ]   . GI: Vomiting[ ] ; Dysphagia[ ] ; Melena[ ] ; Hematochezia [ ] ; Heartburn[ ] ; Abdominal pain [y]; Constipation [ ] ; Diarrhea [ ] ; BRBPR [ ]   . GU: Hematuria[ ] ; Dysuria [ ] ; Nocturia[ ]   . Vascular: Pain in legs with walking [ ] ; Pain in feet with lying flat [ ] ; Non-healing sores [ ] ; Stroke [ ] ; TIA [ ] ; Slurred speech [ ] ;  . Neuro: Headaches[ ] ; Vertigo[ ] ; Seizures[ ] ; Paresthesias[ ] ;Blurred vision [ ] ; Diplopia [ ] ; Vision changes [ ]   . Ortho/Skin: Arthritis [y]; Joint pain [y]; Muscle pain [ ] ; Joint swelling [ ] ; Back Pain [ ] ; Rash [ ]   . Psych: Depression[ ] ; Anxiety[ ]   . Heme: Bleeding problems [ ] ; Clotting disorders [ ] ; Anemia [ ]   . Endocrine: Diabetes [ ] ;  Thyroid dysfunction[ ]   Home Medications Prior to Admission medications   Not on File    Past Medical History: Past Medical History:  Diagnosis Date  . Morbid obesity (HCC)     Past Surgical History: Past Surgical History:  Procedure Laterality Date  . RIGHT/LEFT HEART CATH AND CORONARY ANGIOGRAPHY N/A 10/09/2018   Procedure: RIGHT/LEFT HEART CATH AND CORONARY ANGIOGRAPHY;  Surgeon: Iran Ouch, MD;  Location: [facility] INVASIVE CV LAB;  Service: Cardiovascular;  Laterality: N/A;    Family History: No family history of premature CAD or sudden cardia death   Social History: Social History   Socioeconomic  History  . Marital status: Married    Spouse name: Not on file  . Number of children: Not on file  . Years of education: Not on file  . Highest education level: Not on file  Occupational History  . Not on file  Social Needs  . Financial resource strain: Not on file  . Food insecurity:    Worry: Not on file    Inability: Not on file  . Transportation needs:    Medical: Not on file    Non-medical: Not on file  Tobacco Use  . Smoking status: Never Smoker  . Smokeless tobacco: Never Used  Substance and Sexual Activity  . Alcohol use: Never    Frequency: Never  . Drug use: Never  . Sexual activity: Not on file  Lifestyle  . Physical activity:    Days per week: Not on file    Minutes per session: Not on file  . Stress: Not on file  Relationships  . Social connections:    Talks on phone: Not on file    Gets together: Not on file    Attends religious service: Not on file    Active member of club or organization: Not on file    Attends meetings of clubs or organizations: Not on file    Relationship status: Not on file  Other Topics Concern  . Not on file  Social History Narrative  . Not on file    Allergies:  No Known Allergies  Objective:    Vital Signs:   Temp:  [97.6 F (36.4 C)-98.3 F (36.8 C)] 97.6 F (36.4 C) (03/13 0502) Pulse Rate:  [79-189] 79 (03/13 1245) Resp:  [7-43] 29 (03/13 1245) BP: (98-118)/(71-85) 98/75 (03/13 1245) SpO2:  [87 %-100 %] 98 % (03/13 1245) Weight:  [138.1 kg] 138.1 kg (03/13 0502) Last BM Date: 10/06/18  Weight change: Filed Weights   10/07/18 0427 10/08/18 0423 10/09/18 0502  Weight: (!) 138.6 kg (!) 138.3 kg (!) 138.1 kg    Intake/Output:   Intake/Output Summary (Last 24 hours) at 10/09/2018 1448 Last data filed at 10/09/2018 0912 Gross per 24 hour  Intake 372.13 ml  Output 725 ml  Net -352.87 ml      Physical Exam    General:  Lying in bed chronically ill appearing NAD  HEENT: normal Neck: supple. JVP elevated.  Carotids 2+ bilat; no bruits. No lymphadenopathy or thyromegaly appreciated. Cor: PMI nondisplaced. Regular rate & rhythm. No rubs, gallops or murmurs. Lungs: clear Abdomen: obesity soft, nontender, nondistended. No hepatosplenomegaly. No bruits or masses. Good bowel sounds. Extremities: no cyanosis, clubbing, rash, 2+ edema Neuro: alert & orientedx3, cranial nerves grossly intact. moves all 4 extremities w/o difficulty. Affect pleasant  Telemetry   NSR 80s, personally reviewed.   EKG    10/05/2018 NSR 85 bpm with frequent PVCs, personally reviewed.   Labs  Basic Metabolic Panel: Recent Labs  Lab 10/05/18 0448 10/05/18 1241 10/06/18 0527 10/07/18 0534 10/08/18 0604 10/09/18 0418 10/09/18 1047 10/09/18 1051 10/09/18 1202  NA 145  --  143 140 139 139 140 140  --   K 3.6  --  3.6 3.3* 3.4* 3.9 3.8 3.8  --   CL 107  --  108 105 103 102  --   --   --   CO2 29  --  --   --   --   GLUCOSE 115*  --  104* 103* 103* 106*  --   --   --   BUN 29*  --  24* 22* 21* 20  --   --   --   CREATININE 0.72  --  0.69 0.69 0.70 0.69  --   --  0.76  CALCIUM 8.0*  --  7.7* 7.7* 7.8* 7.9*  --   --   --   MG  --  2.1  --   --   --   --   --   --   --     Liver Function Tests: Recent Labs  Lab 10/03/18 0517 10/06/18 0527 10/08/18 0604  AST 57* 52* 62*  ALT 154* 87* 82*  ALKPHOS 138* 108 124  BILITOT 4.3* 2.9* 2.4*  PROT 5.8* 5.2* 5.5*  ALBUMIN 2.7* 2.1* 2.2*   No results for input(s): LIPASE, AMYLASE in the last 168 hours. No results for input(s): AMMONIA in the last 168 hours.  CBC: Recent Labs  Lab 10/08/18 0604 10/09/18 1047 10/09/18 1051 10/09/18 1202  WBC 9.0  --   --  9.7  HGB 12.8* 13.3 13.6 13.4  HCT 41.8 39.0 40.0 42.4  MCV 94.8  --   --  92.4  PLT 141*  --   --  182    Cardiac Enzymes: No results for input(s): CKTOTAL, CKMB, CKMBINDEX, TROPONINI in the last 168 hours.  BNP: BNP (last 3 results) Recent Labs    10/01/18 1337 10/01/18 1720  BNP  2,769.2* 2,333.1*    ProBNP (last 3 results) No results for input(s): PROBNP in the last 8760 hours.   CBG: No results for input(s): GLUCAP in the last 168 hours.  Coagulation Studies: No results for input(s): LABPROT, INR in the last 72 hours.   Imaging   [place] Ekg Site Rite  Result Date: 10/09/2018 If Site Rite image not attached, placement could not be confirmed due to current cardiac rhythm.    Medications:     Current Medications: . [START ON 10/10/2018] aspirin  81 mg Oral Daily  . atorvastatin  80 mg Oral q1800  . [START ON 10/10/2018] enoxaparin (LOVENOX) injection  40 mg Subcutaneous Q24H  . furosemide  80 mg Intravenous BID  . mouth rinse  15 mL Mouth Rinse BID  . nicotine  21 mg Transdermal Daily  . sodium chloride flush  3 mL Intravenous Q12H  . spironolactone  12.5 mg Oral QHS    Infusions: . sodium chloride    . milrinone 0.25 mcg/kg/min (10/09/18 1249)    Patient Profile   Aaron [name] is a 53 y.o. male with h/o of obesity who presented with c/o fluid retention, abdominal swelling, and peripheral edema x 3-4 weeks.  Echo showed new systolic CHF 15%.    CHF team consulted for follow up and treatment. Cath results pending at time of consultation.   Assessment/Plan   1. Acute systolic CHF, unclear etiology -  Echo 10/02/18 LVEF 15%, Normal RV, Mod LAE, Mod MR, Mild AI, Mild PI.  - Cath today showed severe 3v CAD. Will need to be seen for CABG consideration. - Volume status elevated on exam.  - Started on milrinone 0.25 mcg/kg/min to enhance diuresis given degree of LV dysfunction. [name] daily and CVP q shift.  - Increase lasix to 80 mg IV  - Will discuss BB with MD. Output was OK, but starting on milrinone.  - Consider ARB in anticipation of adding Entresto as pressures tolerate.  - Add spiro 12.5 mg qhs.  - Consider digoxin   - Insert PICC for CVP/[name]  2. CAD - Severe 3v CAD as above. Will need CABG consideration vs PCI of the LAD and LCx.  -  Continue ASA and statin.   3. AKI - Cr 1.44 on admit.  - Cr has normalized with diuresis. Continue to follow.   4. Elevated LFTs - AST 110 and ALT 268 with total Bili 6.2 on admission.  - Likely passive congestion vs low output CHF, cath results pending.   5. Morbid obesity - Body mass index is 37.06 kg/m.  - Will need sleep study as outpatient and encouraged weight loss.   Will optimize HF regimen with an eye towards CABG -> PCI if deemed to be poor/non candidate. Increase diuresis and add HF medications as tolerated.   Medication concerns reviewed with patient and pharmacy team. Barriers identified: Lack of insurance.   Length of Stay: 8  Micheal Tillery PA-C 10/09/2018, 2:48 PM  Advanced Heart Failure Team Pager [phone] (M-F; 7a - 4p)  Please contact [facility] for night-coverage after hours (4p -7a ) and weekends on [email or web address]   Patient seen and examined with the above-signed Advanced Practice Provider and/or Housestaff. I personally reviewed laboratory data, imaging studies and relevant notes. I independently examined the patient and formulated the important aspects of the plan. I have edited the note to reflect any of my changes or salient points. I have personally discussed the plan with the patient and/or family.  53 y/o male with limited PMHx now admitted with acute systolic HF and massive volume overload. EF 15% by echo. Cath today with 3v CAD and markedly decompensated hemodynamics. Will transfer to ICU. Start milrinone. Continue diuresis. Titrate HF meds slowly. Cath films reviewed with Dr. Kirke Corin. Will refer for TCTS consult once HF better optimized. If not CABG candidate, would be candidate for PCI of LAD and LCX. RCA is occluded but has good collaterals from left. Place PICC to follow CVP and co-ox.   Discussed with patients family at bedside.   Arvilla Meres, MD  2:52 PM

## 2018-10-09 NOTE — Progress Notes (Signed)
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 He was also found to have elevated liver enzymes as well as acute kidney injury  Assessment & Plan:   Principal Problem:   Acute systolic CHF (congestive heart failure) (HCC) Active Problems:   Anasarca   Elevated LFTs   AKI (acute kidney injury) (HCC)   Acute hypoxemic respiratory failure (HCC)   Acute hypoxic respiratory failure due to acute systolic CHF with anasarca -Patient's respiratory status improving with diuresis, continue Lasix per cardiology 40 mg 2 times a day starting today. -2D echo done on 10/02/2018 showed an severely depressed EF of 15% which is new for the patient. Cardiology was consulted and are following, for now optimize fluid status and will need cardiac catheterization at [facility] hopefully next week -He is net -11.4L, renal function is stable Plan to have right and left heart cath today  Intermittent VT/SVT -Started on low-dose Coreg  Active Problems Elevated liver enzymes  -Likely in the setting of congestive hepatopathy due to heart failure.  Hypokalemia - replete with goal 4.0 replete.  K is 3.4 this morning.  Acute kidney injury -Resolved, creatinine now within normal limits, monitor while on diuresis  Leukocytosis/concern for pneumonia -No upper respiratory symptoms, afebrile, no productive cough. Continue to monitor off antibiotics  DVT prophylaxis:SCDs Code Status:Full code Family Communication:none at  bedside Disposition Plan:TBD  Procedures:  2D echo: IMPRESSIONS 1. The left ventricle has  a visually estimated ejection fraction of. The cavity size was severely dilated. Left ventricular diastolic Doppler parameters are consistent with restrictive filling Left ventrical global hypokinesis without regional wall  motion abnormalities. 2. Diffuse hypokinesis EF 15% Given severithy of LV dysfunction consider CHF team consult. 3. The right ventricle has normal systolic function. The cavity was normal. There is no increase in right ventricular wall thickness. 4. Left atrial size was moderately dilated. 5. The mitral valve is normal in structure. Mild thickening of the mitral valve leaflet. Mitral valve regurgitation is moderate by color flow Doppler. 6. The tricuspid valve is normal in structure. 7. The aortic valve is tricuspid Mild thickening of the aortic valve Aortic valve regurgitation is mild by color flow Doppler. 8. The pulmonic valve was grossly normal. Pulmonic valve regurgitation is mild by color flow Doppler.  Antimicrobials none   Consultation with cardiology   Estimated body mass index is 37.06 kg/m as calculated from the following:   Height as of this encounter: 6\' 4"  (1.93 m).   Weight as of this encounter: 138.1 kg.   Subjective: She is to go for cath seen earlier today  Objective: Vitals:   10/08/18 1258 10/08/18 1327 10/08/18 2244 10/09/18 0502  BP: 104/64  104/75 110/73  Pulse: 79  83 86  Resp:   20 18  Temp:  98.5 F (36.9 C) 98.3 F (36.8 C) 97.6 F (36.4 C)  TempSrc:  Oral Oral Oral  SpO2: 96%  96% 92%  Weight:    (!) 138.1 kg  Height:        Intake/Output Summary (Last 24 hours) at 10/09/2018 0949 Last data filed at 10/09/2018 0912 Gross per 24 hour  Intake 1092.13 ml  Output 1400 ml  Net -307.87 ml   Filed Weights   10/07/18 0427 10/08/18 0423 10/09/18 0502  Weight: (!) 138.6 kg (!) 138.3 kg (!) 138.1 kg    Examination:  General exam: Appears calm and comfortable  Respiratory system: Diminished breath sounds at the bases to  auscultation. Respiratory effort normal. Cardiovascular system: S1 & S2 heard, RRR. No JVD, murmurs, rubs, gallops or clicks. No pedal edema. Gastrointestinal system: Abdomen is nondistended, soft and nontender. No organomegaly or masses felt. Normal bowel sounds heard. Central nervous system: Alert and oriented. No focal neurological deficits. Extremities: 2+ pitting edema Skin: No rashes, lesions or ulcers Psychiatry: Judgement and insight appear normal. Mood & affect appropriate.     Data Reviewed: I have personally reviewed following labs and imaging studies  CBC: Recent Labs  Lab 10/08/18 0604  WBC 9.0  HGB 12.8*  HCT 41.8  MCV 94.8  PLT 141*   Basic Metabolic Panel: Recent Labs  Lab 10/05/18 0448 10/05/18 1241 10/06/18 0527 10/07/18 0534 10/08/18 0604 10/09/18 0418  NA 145  --  143 140 139 139  K 3.6  --  3.6 3.3* 3.4* 3.9  CL 107  --  108 105 103 102  CO2 29  --  26 30 30 29   GLUCOSE 115*  --  104* 103* 103* 106*  BUN 29*  --  24* 22* 21* 20  CREATININE 0.72  --  0.69 0.69 0.70 0.69  CALCIUM 8.0*  --  7.7* 7.7* 7.8* 7.9*  MG  --  2.1  --   --   --   --    GFR: Estimated Creatinine Clearance: 160.2 mL/min (by C-G formula based on SCr of 0.69 mg/dL). Liver Function Tests: Recent Labs  Lab 10/03/18 0517 10/06/18 0527 10/08/18 0604  AST 57* 52* 62*  ALT 154* 87* 82*  ALKPHOS 138* 108 124  BILITOT 4.3* 2.9* 2.4*  PROT 5.8* 5.2* 5.5*  ALBUMIN 2.7* 2.1* 2.2*   No results for input(s): LIPASE, AMYLASE in the last 168 hours. No results for input(s): AMMONIA in the last 168 hours. Coagulation Profile: No results for input(s): INR, PROTIME in the last 168 hours. Cardiac Enzymes: No results for input(s): CKTOTAL, CKMB, CKMBINDEX, TROPONINI in the last 168 hours. BNP (last 3 results) No results for input(s): PROBNP in the last 8760 hours. HbA1C: No results for input(s): HGBA1C in the last 72 hours. CBG: No results for input(s): GLUCAP in the last 168  hours. Lipid Profile: No results for input(s): CHOL, HDL, LDLCALC, TRIG, CHOLHDL, LDLDIRECT in the last 72 hours. Thyroid Function Tests: No results for input(s): TSH, T4TOTAL, FREET4, T3FREE, THYROIDAB in the last 72 hours. Anemia Panel: No results for input(s): VITAMINB12, FOLATE, FERRITIN, TIBC, IRON, RETICCTPCT in the last 72 hours. Sepsis Labs: No results for input(s): PROCALCITON, LATICACIDVEN in the last 168 hours.  No results found for this or any previous visit (from the past 240 hour(s)).       Radiology Studies: No results found.      Scheduled Meds: . carvedilol  6.25 mg Oral BID WC  . furosemide  40 mg Intravenous BID  . mouth rinse  15 mL Mouth Rinse BID  . nicotine  21 mg Transdermal Daily  . sodium chloride flush  3 mL Intravenous Q12H   Continuous Infusions: . sodium chloride    . sodium chloride 10 mL/hr at 10/09/18 0600     LOS: 8 days     Lanora Manis  Cleopatra Cedar, MD [facility] If 7PM-7AM, please contact night-coverage [email or web address] Password [email or web address] 10/09/2018, 9:49 AM

## 2018-10-09 NOTE — Progress Notes (Signed)
Peripherally Inserted Central Catheter/Midline Placement  The IV Nurse has discussed with the patient and/or persons authorized to consent for the patient, the purpose of this procedure and the potential benefits and risks involved with this procedure.  The benefits include less needle sticks, lab draws from the catheter, and the patient may be discharged home with the catheter. Risks include, but not limited to, infection, bleeding, blood clot (thrombus formation), and puncture of an artery; nerve damage and irregular heartbeat and possibility to perform a PICC exchange if needed/ordered by physician.  Alternatives to this procedure were also discussed.  Bard Power PICC patient education guide, fact sheet on infection prevention and patient information card has been provided to patient /or left at bedside.    PICC/Midline Placement Documentation  PICC Double Lumen 10/09/18 PICC Left Basilic 52 cm 1 cm (Active)  Indication for Insertion or Continuance of Line Vasoactive infusions 10/09/2018  3:03 PM  Exposed Catheter (cm) 1 cm 10/09/2018  3:03 PM  Site Assessment Clean;Dry;Intact 10/09/2018  3:03 PM  Lumen #1 Status Flushed;Blood return noted 10/09/2018  3:03 PM  Lumen #2 Status Flushed;Blood return noted 10/09/2018  3:03 PM  Dressing Type Transparent 10/09/2018  3:03 PM  Dressing Status Clean;Dry;Intact;Antimicrobial disc in place 10/09/2018  3:03 PM  Dressing Intervention New dressing 10/09/2018  3:03 PM  Dressing Change Due 10/16/18 10/09/2018  3:03 PM       Aaron [name] 10/09/2018, 3:04 PM

## 2018-10-09 NOTE — Progress Notes (Signed)
Pt transported off unit in stable condition via cart to Aaron [name] for cardiac cath.  Pt may return pending findings. Transporters x 3, wife and son at cart side.  [id number]

## 2018-10-09 NOTE — H&P (View-Only) (Signed)
Progress Note  Patient Name: Aaron [name] Date of Encounter: 10/09/2018  Primary Cardiologist: Jodelle Red, MD   Subjective   Feels well, nervous about cath, we discussed again.   Inpatient Medications    Scheduled Meds: . carvedilol  6.25 mg Oral BID WC  . furosemide  40 mg Intravenous BID  . mouth rinse  15 mL Mouth Rinse BID  . nicotine  21 mg Transdermal Daily  . sodium chloride flush  3 mL Intravenous Q12H   Continuous Infusions: . sodium chloride    . sodium chloride 10 mL/hr at 10/09/18 0600   PRN Meds: sodium chloride, sodium chloride flush, traMADol   Vital Signs    Vitals:   10/08/18 1258 10/08/18 1327 10/08/18 2244 10/09/18 0502  BP: 104/64  104/75 110/73  Pulse: 79  83 86  Resp:   20 18  Temp:  98.5 F (36.9 C) 98.3 F (36.8 C) 97.6 F (36.4 C)  TempSrc:  Oral Oral Oral  SpO2: 96%  96% 92%  Weight:    (!) 138.1 kg  Height:        Intake/Output Summary (Last 24 hours) at 10/09/2018 0950 Last data filed at 10/09/2018 0912 Gross per 24 hour  Intake 1092.13 ml  Output 1400 ml  Net -307.87 ml   Filed Weights   10/07/18 0427 10/08/18 0423 10/09/18 0502  Weight: (!) 138.6 kg (!) 138.3 kg (!) 138.1 kg    Telemetry    <10 beats of NSVT - Personally Reviewed  ECG   no new  Physical Exam   GEN: No acute distress.   Neck: No JVD Cardiac: regular rhythm, normal rate, no murmurs, rubs, or gallops.  Respiratory: Clear to auscultation bilaterally. GI: Soft, nontender, non-distended  MS: bilateral edema; No deformity. Neuro:  Nonfocal  Psych: Normal affect   Labs    Chemistry Recent Labs  Lab 10/03/18 0517  10/06/18 0527 10/07/18 0534 10/08/18 0604 10/09/18 0418  NA 139   < > 143 140 139 139  K 4.0   < > 3.6 3.3* 3.4* 3.9  CL 107   < > 108 105 103 102  CO2 20*   < > 26 30 30 29   GLUCOSE 118*   < > 104* 103* 103* 106*  BUN 46*   < > 24* 22* 21* 20  CREATININE 1.13   < > 0.69 0.69 0.70 0.69  CALCIUM 8.1*   < > 7.7* 7.7*  7.8* 7.9*  PROT 5.8*  --  5.2*  --  5.5*  --   ALBUMIN 2.7*  --  2.1*  --  2.2*  --   AST 57*  --  52*  --  62*  --   ALT 154*  --  87*  --  82*  --   ALKPHOS 138*  --  108  --  124  --   BILITOT 4.3*  --  2.9*  --  2.4*  --   GFRNONAA >60   < > >60 >60 >60 >60  GFRAA >60   < > >60 >60 >60 >60  ANIONGAP 12   < > 9 5 6 8    < > = values in this interval not displayed.     Hematology Recent Labs  Lab 10/08/18 0604  WBC 9.0  RBC 4.41  HGB 12.8*  HCT 41.8  MCV 94.8  MCH 29.0  MCHC 30.6  RDW 16.7*  PLT 141*    Cardiac EnzymesNo results for input(s): TROPONINI in the  last 168 hours. No results for input(s): TROPIPOC in the last 168 hours.   BNPNo results for input(s): BNP, PROBNP in the last 168 hours.   DDimer No results for input(s): DDIMER in the last 168 hours.   Radiology    No results found.  Cardiac Studies     Patient Profile       Assessment & Plan   Principal Problem:   Acute systolic CHF (congestive heart failure) (HCC) Active Problems:   Anasarca   Elevated LFTs   AKI (acute kidney injury) (HCC)   Acute hypoxemic respiratory failure (HCC)   Acute Systolic CHF - Echo showed LVEF of 15% with global hypokinesis but no regional wall motion abnormalities.  - BNP 2, 769.2 on admission. - Diuresing well on IV Lasix. Negative 5 L since admission. Weight 303 lbs today, down from 333 on admission. Renal function stable. -  lasix 40 mg IV BID  - Continue Coreg 6.25 twice daily. - May start Entresto after R/L heart catheterization. - Continue to monitor daily weight, strict I/O's, and renal function. Cr stable. - Plan is to to proceed with left/right heart catheterization at 10:30am with Dr. Kirke Corin. Patient was consented yesterday and orders were placed.   Non-Sustained VT - A few short episodes of non-sustained VT. Longest run being 21 beats on 10/05/2018.  Patient asymptomatic with this. These runs improved some with increase dose of Coreg. - Potassium 3.9  today. Goal > 4.0.  - Magnesium 2.1 on 10/05/2018. - TSH minimally elevated at 4.716. Free T4 normal. - Continue Coreg as above. Maximize dose as able.  Hypokalemia Continue potassium supplementation while diuresing given NSVT.     For questions or updates, please contact [facility] Please consult [email or web address] for contact info under        Signed, Parke Poisson, MD  10/09/2018, 9:50 AM

## 2018-10-09 NOTE — Progress Notes (Signed)
No bleeding, redness, hematoma noted. Area covered wih 2 x 2 and tape.

## 2018-10-10 ENCOUNTER — Inpatient Hospital Stay (HOSPITAL_COMMUNITY): Payer: Medicaid Other

## 2018-10-10 DIAGNOSIS — R570 Cardiogenic shock: Secondary | ICD-10-CM

## 2018-10-10 LAB — CBC WITH DIFFERENTIAL/PLATELET
Abs Immature Granulocytes: 0.05 10*3/uL (ref 0.00–0.07)
Basophils Absolute: 0 10*3/uL (ref 0.0–0.1)
Basophils Relative: 0 %
EOS PCT: 2 %
Eosinophils Absolute: 0.2 10*3/uL (ref 0.0–0.5)
HCT: 37.1 % — ABNORMAL LOW (ref 39.0–52.0)
Hemoglobin: 11.7 g/dL — ABNORMAL LOW (ref 13.0–17.0)
Immature Granulocytes: 1 %
LYMPHS PCT: 11 %
Lymphs Abs: 0.8 10*3/uL (ref 0.7–4.0)
MCH: 28.9 pg (ref 26.0–34.0)
MCHC: 31.5 g/dL (ref 30.0–36.0)
MCV: 91.6 fL (ref 80.0–100.0)
Monocytes Absolute: 0.5 10*3/uL (ref 0.1–1.0)
Monocytes Relative: 7 %
Neutro Abs: 6.2 10*3/uL (ref 1.7–7.7)
Neutrophils Relative %: 79 %
Platelets: 164 10*3/uL (ref 150–400)
RBC: 4.05 MIL/uL — ABNORMAL LOW (ref 4.22–5.81)
RDW: 16.4 % — ABNORMAL HIGH (ref 11.5–15.5)
WBC: 7.8 10*3/uL (ref 4.0–10.5)
nRBC: 0 % (ref 0.0–0.2)

## 2018-10-10 LAB — COOXEMETRY PANEL
Carboxyhemoglobin: 2.5 % — ABNORMAL HIGH (ref 0.5–1.5)
Methemoglobin: 1.5 % (ref 0.0–1.5)
O2 Saturation: 71.9 %
Total hemoglobin: 12.3 g/dL (ref 12.0–16.0)

## 2018-10-10 LAB — HEMOGLOBIN A1C
Hgb A1c MFr Bld: 5.8 % — ABNORMAL HIGH (ref 4.8–5.6)
Mean Plasma Glucose: 119.76 mg/dL

## 2018-10-10 LAB — BASIC METABOLIC PANEL
Anion gap: 7 (ref 5–15)
BUN: 17 mg/dL (ref 6–20)
CO2: 27 mmol/L (ref 22–32)
Calcium: 7.6 mg/dL — ABNORMAL LOW (ref 8.9–10.3)
Chloride: 101 mmol/L (ref 98–111)
Creatinine, Ser: 0.74 mg/dL (ref 0.61–1.24)
GFR calc Af Amer: >60 mL/min (ref >60)
GFR calc non Af Amer: >60 mL/min (ref >60)
Glucose, Bld: 171 mg/dL — ABNORMAL HIGH (ref 70–99)
Potassium: 3.2 mmol/L — ABNORMAL LOW (ref 3.5–5.1)
Sodium: 135 mmol/L (ref 135–145)

## 2018-10-10 LAB — MRSA PCR SCREENING: MRSA by PCR: NEGATIVE

## 2018-10-10 MED ORDER — WITCH HAZEL-GLYCERIN EX PADS
MEDICATED_PAD | CUTANEOUS | Status: DC | PRN
  Filled 2018-10-10: qty 100

## 2018-10-10 MED ORDER — POTASSIUM CHLORIDE CRYS ER 20 MEQ PO TBCR
40.0000 meq | EXTENDED_RELEASE_TABLET | Freq: Once | ORAL | Status: AC
  Administered 2018-10-10: 40 meq via ORAL
  Filled 2018-10-10: qty 2

## 2018-10-10 MED ORDER — POTASSIUM CHLORIDE ER 10 MEQ PO TBCR
40.0000 meq | EXTENDED_RELEASE_TABLET | Freq: Two times a day (BID) | ORAL | Status: DC
  Filled 2018-10-10: qty 4

## 2018-10-10 MED ORDER — POTASSIUM CHLORIDE ER 10 MEQ PO TBCR
40.0000 meq | EXTENDED_RELEASE_TABLET | Freq: Two times a day (BID) | ORAL | Status: DC
  Administered 2018-10-10 – 2018-10-11 (×2): 40 meq via ORAL
  Filled 2018-10-10 (×4): qty 4

## 2018-10-10 MED ORDER — LOSARTAN POTASSIUM 25 MG PO TABS
12.5000 mg | ORAL_TABLET | Freq: Every day | ORAL | Status: DC
  Administered 2018-10-10 – 2018-10-11 (×2): 12.5 mg via ORAL
  Filled 2018-10-10 (×2): qty 1

## 2018-10-10 NOTE — Progress Notes (Signed)
Advanced Heart Failure Rounding Note   Subjective:    On milrinone. Co-ox much improved. Diuresing well. Denies CP or SOB.  No orthopnea or PND.   Seen by TCTS and and felt not to be candidate for CABG currently   Feels constipated. Had BM this morning but was unable to get back in be easily.   Objective:   Weight Range:  Vital Signs:   Temp:  [98 F (36.7 C)-98.2 F (36.8 C)] 98 F (36.7 C) (03/14 0900) Pulse Rate:  [79-189] 99 (03/14 0827) Resp:  [6-43] 11 (03/14 0700) BP: (95-126)/(52-88) 102/58 (03/14 0600) SpO2:  [87 %-100 %] 96 % (03/14 0700) Weight:  [134.3 kg] 134.3 kg (03/14 0600) Last BM Date: 10/08/18  Weight change: Filed Weights   10/08/18 0423 10/09/18 0502 10/10/18 0600  Weight: (!) 138.3 kg (!) 138.1 kg 134.3 kg    Intake/Output:   Intake/Output Summary (Last 24 hours) at 10/10/2018 0908 Last data filed at 10/10/2018 0500 Gross per 24 hour  Intake 349.52 ml  Output 1775 ml  Net -1425.48 ml     Physical Exam: General:  Obese male sitting in bed. No resp difficulty HEENT: normal Neck: supple. JVP 8 . Carotids 2+ bilat; no bruits. No lymphadenopathy or thryomegaly appreciated. Cor: PMI nondisplaced. Regular rate & rhythm. No rubs, gallops or murmurs. Lungs: clear Abdomen: soft, nontender, + distended. No hepatosplenomegaly. No bruits or masses. Good bowel sounds. Extremities: no cyanosis, clubbing, rash, 2+ edema RUE PICC Neuro: alert & orientedx3, cranial nerves grossly intact. moves all 4 extremities w/o difficulty. Affect pleasant  Telemetry: NSR 90s Personally reviewed   Labs: Basic Metabolic Panel: Recent Labs  Lab 10/05/18 1241 10/06/18 0527 10/07/18 0534 10/08/18 0604 10/09/18 0418 10/09/18 1047 10/09/18 1051 10/09/18 1202 10/10/18 0506  NA  --  143 140 139 139 140 140  --  135  K  --  3.6 3.3* 3.4* 3.9 3.8 3.8  --  3.2*  CL  --  108 105 103 102  --   --   --  101  CO2  --  26 30 30 29   --   --   --  27  GLUCOSE  --  104*  103* 103* 106*  --   --   --  171*  BUN  --  24* 22* 21* 20  --   --   --  17  CREATININE  --  0.69 0.69 0.70 0.69  --   --  0.76 0.74  CALCIUM  --  7.7* 7.7* 7.8* 7.9*  --   --   --  7.6*  MG 2.1  --   --   --   --   --   --   --   --     Liver Function Tests: Recent Labs  Lab 10/06/18 0527 10/08/18 0604  AST 52* 62*  ALT 87* 82*  ALKPHOS 108 124  BILITOT 2.9* 2.4*  PROT 5.2* 5.5*  ALBUMIN 2.1* 2.2*   No results for input(s): LIPASE, AMYLASE in the last 168 hours. No results for input(s): AMMONIA in the last 168 hours.  CBC: Recent Labs  Lab 10/08/18 0604 10/09/18 1047 10/09/18 1051 10/09/18 1202 10/10/18 0506  WBC 9.0  --   --  9.7 7.8  NEUTROABS  --   --   --   --  6.2  HGB 12.8* 13.3 13.6 13.4 11.7*  HCT 41.8 39.0 40.0 42.4 37.1*  MCV 94.8  --   --  92.4 91.6  PLT 141*  --   --  182 164    Cardiac Enzymes: No results for input(s): CKTOTAL, CKMB, CKMBINDEX, TROPONINI in the last 168 hours.  BNP: BNP (last 3 results) Recent Labs    10/01/18 1337 10/01/18 1720  BNP 2,769.2* 2,333.1*    ProBNP (last 3 results) No results for input(s): PROBNP in the last 8760 hours.    Other results:  Imaging: [place] Ekg Site Rite  Result Date: 10/09/2018 If Site Rite image not attached, placement could not be confirmed due to current cardiac rhythm.     Medications:     Scheduled Medications: . aspirin  81 mg Oral Daily  . atorvastatin  80 mg Oral q1800  . Chlorhexidine Gluconate Cloth  6 each Topical Daily  . digoxin  0.125 mg Oral Daily  . enoxaparin (LOVENOX) injection  40 mg Subcutaneous Q24H  . furosemide  80 mg Intravenous BID  . mouth rinse  15 mL Mouth Rinse BID  . nicotine  21 mg Transdermal Daily  . sodium chloride flush  10-40 mL Intracatheter Q12H  . sodium chloride flush  3 mL Intravenous Q12H  . spironolactone  12.5 mg Oral QHS     Infusions: . sodium chloride    . milrinone 0.25 mcg/kg/min (10/10/18 0500)     PRN Medications:   sodium chloride, acetaminophen, ondansetron (ZOFRAN) IV, sodium chloride flush, sodium chloride flush, traMADol   Assessment:    Aaron [name] is a 53 y.o. male with h/o of obesity who presented with c/o fluid retention, abdominal swelling, and peripheral edema x 3-4 weeks.  Echo showed new systolic CHF 15%.    CHF team consulted for follow up and treatment. Cath results pending at time of consultation.    Plan/Discussion:    1. Acute systolic CHF -> cardiogenic shock due to iCM - Echo 10/02/18 LVEF 15%, Normal RV, Mod LAE, Mod MR, Mild AI, Mild PI.  - Cath 3/13 showed severe 3v CAD with markedly decompensated hemodynamics - Continues on milrinone 0.25 mcg/kg/min. Co-ox improved to 72% - On lasix 80 IV bid. Continues to diurese well. Weight down another 8 pounds. 37 pounds total - CVP 8 - Will continue diuresis. - Continue spiro, digoxin/ Add low dose-losartan - No b-blocker yet with shock - Wrap legs with UNNA boots  2. CAD - Severe 3v CAD as above. Has been seen by Dr. Dorris Fetch (TCTS). Not CABG candidate currently - As the RCA has good collaterals and LAD is focal mid lesion, PCI of the LAD and LCx may be viable option - Continue ASA and statin. No b-blocker yet with shock  3. AKI - Cr 1.44 on admit.  - Cr has normalized with diuresis. Continue to follow.   4. Elevated LFTs/shock liver - AST 110 and ALT 268 with total Bili 6.2 on admission. Now improved.  - Likely passive congestion/low output CHF, cath results pending.   5. Morbid obesity - Body mass index is 37.06 kg/m.  - Will need sleep study as outpatient and encouraged weight loss.   6. Hypokalemia - will supp  7. Severe deconditioning due to obesity - will need PT/OT  CRITICAL CARE Performed by: Arvilla Meres  Total critical care time: 35 minutes  Critical care time was exclusive of separately billable procedures and treating other patients.  Critical care was necessary to treat or prevent  imminent or life-threatening deterioration.  Critical care was time spent personally by me (independent of midlevel providers or residents) on the  following activities: development of treatment plan with patient and/or surrogate as well as nursing, discussions with consultants, evaluation of patient's response to treatment, examination of patient, obtaining history from patient or surrogate, ordering and performing treatments and interventions, ordering and review of laboratory studies, ordering and review of radiographic studies, pulse oximetry and re-evaluation of patient's condition.    Length of Stay: 9   Arvilla Meres MD 10/10/2018, 9:08 AM  Advanced Heart Failure Team Pager [phone] (M-F; 7a - 4p)  Please contact [facility] for night-coverage after hours (4p -7a ) and weekends on [email or web address]

## 2018-10-10 NOTE — Progress Notes (Signed)
Orthopedic Tech Progress Note Patient Details:  Aaron [name] 08-26-1965 [id number]  Ortho Devices Type of Ortho Device: Radio broadcast assistant Ortho Device/Splint Location: bilateral Ortho Device/Splint Interventions: Adjustment, Application, Ordered   Post Interventions Patient Tolerated: Well Instructions Provided: Care of device, Adjustment of device   [name] 10/10/2018, 11:43 AM

## 2018-10-10 NOTE — Plan of Care (Signed)
  Problem: Health Behavior/Discharge Planning: Goal: Ability to manage health-related needs will improve Outcome: Progressing   Problem: Clinical Measurements: Goal: Will remain free from infection Outcome: Progressing Goal: Respiratory complications will improve Outcome: Progressing Goal: Cardiovascular complication will be avoided Outcome: Progressing   Problem: Activity: Goal: Risk for activity intolerance will decrease Outcome: Progressing   Problem: Coping: Goal: Level of anxiety will decrease Outcome: Progressing   Problem: Elimination: Goal: Will not experience complications related to bowel motility Outcome: Progressing   Problem: Skin Integrity: Goal: Risk for impaired skin integrity will decrease Outcome: Progressing   Problem: Education: Goal: Ability to demonstrate management of disease process will improve Outcome: Progressing Goal: Ability to verbalize understanding of medication therapies will improve Outcome: Progressing   Problem: Activity: Goal: Capacity to carry out activities will improve Outcome: Progressing   Problem: Cardiac: Goal: Ability to achieve and maintain adequate cardiopulmonary perfusion will improve Outcome: Progressing

## 2018-10-11 LAB — BASIC METABOLIC PANEL
Anion gap: 8 (ref 5–15)
BUN: 13 mg/dL (ref 6–20)
CO2: 27 mmol/L (ref 22–32)
Calcium: 8.2 mg/dL — ABNORMAL LOW (ref 8.9–10.3)
Chloride: 101 mmol/L (ref 98–111)
Creatinine, Ser: 0.79 mg/dL (ref 0.61–1.24)
GFR calc Af Amer: >60 mL/min (ref >60)
GFR calc non Af Amer: >60 mL/min (ref >60)
GLUCOSE: 97 mg/dL (ref 70–99)
Potassium: 3.8 mmol/L (ref 3.5–5.1)
Sodium: 136 mmol/L (ref 135–145)

## 2018-10-11 LAB — COOXEMETRY PANEL
Carboxyhemoglobin: 2.3 % — ABNORMAL HIGH (ref 0.5–1.5)
Methemoglobin: 1.5 % (ref 0.0–1.5)
O2 Saturation: 72.8 %
Total hemoglobin: 12.9 g/dL (ref 12.0–16.0)

## 2018-10-11 MED ORDER — LOSARTAN POTASSIUM 25 MG PO TABS
12.5000 mg | ORAL_TABLET | Freq: Two times a day (BID) | ORAL | Status: DC
  Administered 2018-10-11 – 2018-10-15 (×7): 12.5 mg via ORAL
  Filled 2018-10-11 (×7): qty 1

## 2018-10-11 MED ORDER — MILRINONE LACTATE IN DEXTROSE 20-5 MG/100ML-% IV SOLN: 0.1250 ug/kg/min | INTRAVENOUS | Status: DC

## 2018-10-11 MED ORDER — TORSEMIDE 20 MG PO TABS
40.0000 mg | ORAL_TABLET | Freq: Every day | ORAL | Status: DC
  Administered 2018-10-12 – 2018-10-14 (×3): 40 mg via ORAL
  Filled 2018-10-11 (×3): qty 2

## 2018-10-11 MED ORDER — MILRINONE LACTATE IN DEXTROSE 20-5 MG/100ML-% IV SOLN
0.1250 ug/kg/min | INTRAVENOUS | Status: DC
  Administered 2018-10-11 (×2): 0.125 ug/kg/min via INTRAVENOUS
  Filled 2018-10-11: qty 100

## 2018-10-11 MED ORDER — POTASSIUM CHLORIDE CRYS ER 10 MEQ PO TBCR
40.0000 meq | EXTENDED_RELEASE_TABLET | Freq: Every day | ORAL | Status: DC
  Administered 2018-10-12 – 2018-10-14 (×3): 40 meq via ORAL
  Filled 2018-10-11 (×4): qty 4

## 2018-10-11 NOTE — Progress Notes (Signed)
Advanced Heart Failure Rounding Note   Subjective:    On milrinone. Says he is feeling better after having multiple BMs. Denies CP or SOB. Remains weak and says he can walk but main issue is inability to stand up. RNs says he does not participate well in trying to take care of himself.   CVP 6. Co-ox 73%. Weight up this am (seems inaccurate).   Seen by TCTS and and felt not to be candidate for CABG currently   Objective:   Weight Range:  Vital Signs:   Temp:  [97.7 F (36.5 C)-98.8 F (37.1 C)] 98.5 F (36.9 C) (03/15 0700) Pulse Rate:  [85-104] 99 (03/15 0817) Resp:  [0-25] 17 (03/15 0817) BP: (84-123)/(56-83) 107/78 (03/15 0817) SpO2:  [92 %-100 %] 100 % (03/15 0817) Weight:  [137.3 kg] 137.3 kg (03/15 0500) Last BM Date: 10/10/18  Weight change: Filed Weights   10/09/18 0502 10/10/18 0600 10/11/18 0500  Weight: (!) 138.1 kg 134.3 kg (!) 137.3 kg    Intake/Output:   Intake/Output Summary (Last 24 hours) at 10/11/2018 1030 Last data filed at 10/11/2018 0818 Gross per 24 hour  Intake 429.99 ml  Output 2620 ml  Net -2190.01 ml     Physical Exam: General:  Obese male lying in bed . No resp difficulty HEENT: normal Neck: supple. JVP 6 . Carotids 2+ bilat; no bruits. No lymphadenopathy or thryomegaly appreciated. Cor: PMI nondisplaced. Regular rate & rhythm. No rubs, gallops or murmurs. Lungs: clear Abdomen: soft, nontender, nondistended. No hepatosplenomegaly. No bruits or masses. Good bowel sounds. Extremities: no cyanosis, clubbing, rash, + UNNA boots minimal edema  Neuro: alert & orientedx3, cranial nerves grossly intact. moves all 4 extremities w/o difficulty. Affect pleasant  Telemetry: NSR 90-100s Personally reviewed   Labs: Basic Metabolic Panel: Recent Labs  Lab 10/05/18 1241  10/07/18 0534 10/08/18 0604 10/09/18 0418 10/09/18 1047 10/09/18 1051 10/09/18 1202 10/10/18 0506 10/11/18 0322  NA  --    < > 140 139 139 140 140  --  135 136  K   --    < > 3.3* 3.4* 3.9 3.8 3.8  --  3.2* 3.8  CL  --    < > 105 103 102  --   --   --  101 101  CO2  --    < > 30 30 29   --   --   --  27 27  GLUCOSE  --    < > 103* 103* 106*  --   --   --  171* 97  BUN  --    < > 22* 21* 20  --   --   --  17 13  CREATININE  --    < > 0.69 0.70 0.69  --   --  0.76 0.74 0.79  CALCIUM  --    < > 7.7* 7.8* 7.9*  --   --   --  7.6* 8.2*  MG 2.1  --   --   --   --   --   --   --   --   --    < > = values in this interval not displayed.    Liver Function Tests: Recent Labs  Lab 10/06/18 0527 10/08/18 0604  AST 52* 62*  ALT 87* 82*  ALKPHOS 108 124  BILITOT 2.9* 2.4*  PROT 5.2* 5.5*  ALBUMIN 2.1* 2.2*   No results for input(s): LIPASE, AMYLASE in the last 168 hours. No  results for input(s): AMMONIA in the last 168 hours.  CBC: Recent Labs  Lab 10/08/18 0604 10/09/18 1047 10/09/18 1051 10/09/18 1202 10/10/18 0506  WBC 9.0  --   --  9.7 7.8  NEUTROABS  --   --   --   --  6.2  HGB 12.8* 13.3 13.6 13.4 11.7*  HCT 41.8 39.0 40.0 42.4 37.1*  MCV 94.8  --   --  92.4 91.6  PLT 141*  --   --  182 164    Cardiac Enzymes: No results for input(s): CKTOTAL, CKMB, CKMBINDEX, TROPONINI in the last 168 hours.  BNP: BNP (last 3 results) Recent Labs    10/01/18 1337 10/01/18 1720  BNP 2,769.2* 2,333.1*    ProBNP (last 3 results) No results for input(s): PROBNP in the last 8760 hours.    Other results:  Imaging: Dg Abd Portable 1v  Result Date: 10/10/2018 CLINICAL DATA:  Abdominal pain EXAM: PORTABLE ABDOMEN - 1 VIEW COMPARISON:  CT 10/01/2018 FINDINGS: Bowel gas pattern is unremarkable. No calcification or bone findings. IMPRESSION: Negative. Electronically Signed   By: Paulina Fusi M.D.   On: 10/10/2018 10:48   [place] Ekg Site Rite  Result Date: 10/09/2018 If Site Rite image not attached, placement could not be confirmed due to current cardiac rhythm.    Medications:     Scheduled Medications: . aspirin  81 mg Oral Daily  .  atorvastatin  80 mg Oral q1800  . Chlorhexidine Gluconate Cloth  6 each Topical Daily  . digoxin  0.125 mg Oral Daily  . enoxaparin (LOVENOX) injection  40 mg Subcutaneous Q24H  . furosemide  80 mg Intravenous BID  . losartan  12.5 mg Oral Daily  . mouth rinse  15 mL Mouth Rinse BID  . nicotine  21 mg Transdermal Daily  . potassium chloride  40 mEq Oral BID  . sodium chloride flush  10-40 mL Intracatheter Q12H  . sodium chloride flush  3 mL Intravenous Q12H  . spironolactone  12.5 mg Oral QHS    Infusions: . sodium chloride    . milrinone 0.25 mcg/kg/min (10/11/18 0818)    PRN Medications: sodium chloride, acetaminophen, ondansetron (ZOFRAN) IV, sodium chloride flush, sodium chloride flush, traMADol, witch hazel-glycerin   Assessment:    Sarath Sullinger is a 53 y.o. male with h/o of obesity who presented with c/o fluid retention, abdominal swelling, and peripheral edema x 3-4 weeks.  Echo showed new systolic CHF 15%.    CHF team consulted for follow up and treatment. Cath results pending at time of consultation.    Plan/Discussion:    1. Acute systolic CHF -> cardiogenic shock due to iCM - Echo 10/02/18 LVEF 15%, Normal RV, Mod LAE, Mod MR, Mild AI, Mild PI.  - Cath 3/13 showed severe 3v CAD with markedly decompensated hemodynamics - Continues on milrinone 0.25 mcg/kg/min. Co-ox 73% - CVP down to 6. Will switch back to oral diuretics - Continue spiro, digoxin. Increase losartan. Will wean milrinone to 0.125 - No b-blocker yet with shock - Continue UNNA boots  2. CAD - Severe 3v CAD as above. Has been seen by Dr. Dorris Fetch (TCTS). Not CABG candidate currently given hemodynamics and lack of mobility.  - As the RCA has good collaterals and LAD is focal mid lesion, PCI of the LAD and LCx may be best option later this week - Continue ASA and statin. No b-blocker yet with shock  3. AKI - Cr 1.44 on admit.  - Cr has  normalized with diuresis. Now 0.79. Continue to follow.    4. Elevated LFTs/shock liver - AST 110 and ALT 268 with total Bili 6.2 on admission. Now improved.  - Likely passive congestion/low output CHF, cath results pending.   5. Morbid obesity - Body mass index is 37.06 kg/m.  - Will need sleep study as outpatient and encouraged weight loss.   6. Hypokalemia - will supp  7. Severe deconditioning due to obesity - will need PT/OT  Can transfer to SDU  Length of Stay: 10   Arvilla Meres MD 10/11/2018, 10:30 AM  Advanced Heart Failure Team Pager [phone] (M-F; 7a - 4p)  Please contact [facility] for night-coverage after hours (4p -7a ) and weekends on [email or web address]

## 2018-10-12 ENCOUNTER — Other Ambulatory Visit (HOSPITAL_COMMUNITY): Payer: Self-pay | Admitting: Respiratory Therapy

## 2018-10-12 ENCOUNTER — Encounter (HOSPITAL_COMMUNITY): Payer: Self-pay

## 2018-10-12 DIAGNOSIS — I4891 Unspecified atrial fibrillation: Secondary | ICD-10-CM

## 2018-10-12 LAB — COOXEMETRY PANEL
Carboxyhemoglobin: 2.6 % — ABNORMAL HIGH (ref 0.5–1.5)
Methemoglobin: 0.9 % (ref 0.0–1.5)
O2 Saturation: 64.2 %
Total hemoglobin: 12.8 g/dL (ref 12.0–16.0)

## 2018-10-12 LAB — BASIC METABOLIC PANEL
Anion gap: 5 (ref 5–15)
BUN: 12 mg/dL (ref 6–20)
CALCIUM: 7.6 mg/dL — AB (ref 8.9–10.3)
CO2: 25 mmol/L (ref 22–32)
Chloride: 103 mmol/L (ref 98–111)
Creatinine, Ser: 0.79 mg/dL (ref 0.61–1.24)
GFR calc Af Amer: >60 mL/min (ref >60)
GFR calc non Af Amer: >60 mL/min (ref >60)
Glucose, Bld: 114 mg/dL — ABNORMAL HIGH (ref 70–99)
Potassium: 3.9 mmol/L (ref 3.5–5.1)
Sodium: 133 mmol/L — ABNORMAL LOW (ref 135–145)

## 2018-10-12 LAB — HEPARIN LEVEL (UNFRACTIONATED): Heparin Unfractionated: 0.28 IU/mL — ABNORMAL LOW (ref 0.30–0.70)

## 2018-10-12 LAB — MAGNESIUM: Magnesium: 2 mg/dL (ref 1.7–2.4)

## 2018-10-12 MED ORDER — HEPARIN (PORCINE) 25000 UT/250ML-% IV SOLN
1750.0000 [IU]/h | INTRAVENOUS | Status: DC
  Administered 2018-10-12 (×2): 1800 [IU]/h via INTRAVENOUS
  Administered 2018-10-13 (×2): 1950 [IU]/h via INTRAVENOUS
  Administered 2018-10-14: 1750 [IU]/h via INTRAVENOUS
  Filled 2018-10-12 (×7): qty 250

## 2018-10-12 MED ORDER — AMIODARONE HCL IN DEXTROSE 360-4.14 MG/200ML-% IV SOLN
30.0000 mg/h | INTRAVENOUS | Status: DC
  Administered 2018-10-12 – 2018-10-14 (×4): 30 mg/h via INTRAVENOUS
  Filled 2018-10-12 (×4): qty 200

## 2018-10-12 MED ORDER — AMIODARONE HCL IN DEXTROSE 360-4.14 MG/200ML-% IV SOLN
60.0000 mg/h | INTRAVENOUS | Status: AC
  Administered 2018-10-12 (×2): 60 mg/h via INTRAVENOUS
  Filled 2018-10-12 (×2): qty 200

## 2018-10-12 MED ORDER — HEPARIN BOLUS VIA INFUSION
5500.0000 [IU] | Freq: Once | INTRAVENOUS | Status: AC
  Administered 2018-10-12: 5500 [IU] via INTRAVENOUS
  Filled 2018-10-12: qty 5500

## 2018-10-12 MED ORDER — AMIODARONE LOAD VIA INFUSION
150.0000 mg | Freq: Once | INTRAVENOUS | Status: AC
  Administered 2018-10-12: 150 mg via INTRAVENOUS
  Filled 2018-10-12: qty 83.34

## 2018-10-12 MED ORDER — ALUM & MAG HYDROXIDE-SIMETH 200-200-20 MG/5ML PO SUSP
30.0000 mL | ORAL | Status: DC | PRN
  Administered 2018-10-12 – 2018-10-16 (×2): 30 mL via ORAL
  Filled 2018-10-12 (×2): qty 30

## 2018-10-12 MED FILL — Lidocaine HCl Local Preservative Free (PF) Inj 1%: INTRAMUSCULAR | Qty: 2 | Status: AC

## 2018-10-12 MED FILL — Verapamil HCl IV Soln 2.5 MG/ML: INTRAVENOUS | Qty: 3 | Status: AC

## 2018-10-12 NOTE — Progress Notes (Signed)
Notified Judithann Sauger PA about pt becoming increasingly nauseated after IV amio infusing. Cont to monitor. Emelda Brothers RN

## 2018-10-12 NOTE — Progress Notes (Signed)
Attempted to stand patient for morning weight. Patient became lightheaded and dizzy when he sat on the side of the bed. Returned pt to bed and obtained a bed weight.

## 2018-10-12 NOTE — Progress Notes (Signed)
ANTICOAGULATION CONSULT NOTE - Initial Consult  Pharmacy Consult for heparin Indication: atrial fibrillation  No Known Allergies  Patient Measurements: Height: 6\' 4"  (193 cm) Weight: (!) 300 lb 0.7 oz (136.1 kg) IBW/kg (Calculated) : 86.8 Heparin Dosing Weight: 117kg  Vital Signs: Temp: 97.7 F (36.5 C) (03/15 2338) Temp Source: Oral (03/15 2338) BP: 90/66 (03/16 0452) Pulse Rate: 61 (03/16 0452)  Labs: Recent Labs    10/09/18 1051  10/09/18 1202 10/10/18 0506 10/11/18 0322 10/12/18 0452  HGB 13.6  --  13.4 11.7*  --   --   HCT 40.0  --  42.4 37.1*  --   --   PLT  --   --  182 164  --   --   CREATININE  --    < > 0.76 0.74 0.79 0.79   < > = values in this interval not displayed.    Estimated Creatinine Clearance: 159 mL/min (by C-G formula based on SCr of 0.79 mg/dL).   Medical History: Past Medical History:  Diagnosis Date  . Morbid obesity (HCC)     Assessment: 29 YOM with new onset Afib, not on anticoagulation PTA and last dose of lovenox (DVT ppx dosing) on 3/15 @0800 , starting heparin with plans for long-term AC.  H/H 11.7/37.1, plts wnl, no bleeding.   Goal of Therapy:  Heparin level 0.3-0.7 units/ml Monitor platelets by anticoagulation protocol: Yes   Plan:  Heparin 5500 units IV x1, and gtt at 1800 units/hr F/u 6 hour heparin level  Daylene Posey, PharmD Clinical Pharmacist Please check AMION for all [facility] Pharm[facility] numbers 10/12/2018 8:09 AM

## 2018-10-12 NOTE — Progress Notes (Signed)
ANTICOAGULATION CONSULT NOTE  Pharmacy Consult for heparin Indication: atrial fibrillation  No Known Allergies  Patient Measurements: Height: 6\' 4"  (193 cm) Weight: (!) 300 lb 0.7 oz (136.1 kg) IBW/kg (Calculated) : 86.8 Heparin Dosing Weight: 117kg  Vital Signs: Temp: 98.1 F (36.7 C) (03/16 1929) Temp Source: Oral (03/16 1929) BP: 96/72 (03/16 1929) Pulse Rate: 75 (03/16 1929)  Labs: Recent Labs    10/10/18 0506 10/11/18 0322 10/12/18 0452 10/12/18 1950  HGB 11.7*  --   --   --   HCT 37.1*  --   --   --   PLT 164  --   --   --   HEPARINUNFRC  --   --   --  0.28*  CREATININE 0.74 0.79 0.79  --     Estimated Creatinine Clearance: 159 mL/min (by C-G formula based on SCr of 0.79 mg/dL).   Medical History: Past Medical History:  Diagnosis Date  . Morbid obesity (HCC)     Assessment: 22 YOM with new onset Afib, not on anticoagulation PTA and last dose of lovenox (DVT ppx dosing) on 3/15 @0800 , starting heparin with plans for long-term AC.  H/H 11.7/37.1, plts wnl on last check.  Initial heparin level came back just slightly subtherapeutic at 0.28, on 1800 units/hr. Of note, it had been off for about 2 hours and restarted at 1400. No s/sx of bleeding. No infusion issues per nursing.     Goal of Therapy:  Heparin level 0.3-0.7 units/ml Monitor platelets by anticoagulation protocol: Yes   Plan:  Increase infusion to 1950 units/hr Obtain heparin level in 6 hours Monitor daily HL, CBC, and for s/sx of bleeding  Sherron Monday, PharmD, BCCCP Clinical Pharmacist  Pager: [phone] Phone: [phone] Please check AMION for all [facility] Pharm[facility] numbers 10/12/2018 8:41 PM

## 2018-10-12 NOTE — Evaluation (Addendum)
Occupational Therapy Evaluation Patient Details Name: Aaron [name] MRN: [id number] DOB: 1965-09-20 Today's Date: 10/12/2018    History of Present Illness 53 y.o. 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 New onset of anasacra and CHF with EF of 15%. Transfered to [facility] for cardiac catheterization  on 3/13  and found to have multivessel CAD.   Clinical Impression   PTA, pt was living with his wife and was independent and working as a Radiation protection practitioner. Within last 3-4 weeks, pt has had progressive weakness making ADLs and short distance mobility very difficult. Per RN request, eval kept at EOB and pt performing no OOB activity due to A-fib; VSS throughout. Pt very eager to participate in therapy despite fatigue and pain and is highly motivated to return to PLOF. Currently, pt requiring Min A for UB ADLs, Max A for LB ADLs, and Min-Mod A for bed mobility. Pt with good family support and wife present throughout. Pt will require further acute OT to facilitate safe dc. Recommend dc to CIR for intensive OT to optimize safety, independence with ADLs, and return to PLOF.   HR 117-129.  SpO2 >90% on RA; placed back on 2L at end of session.  RR 20-30s.   BP: 95/78 supine 115/85 with HOB elevated to 52* 112/61 at EOB 107/87 at end of session with pt in bed and bed in chair position.    Follow Up Recommendations  CIR;Supervision/Assistance - 24 hour    Equipment Recommendations  Other (comment)(Defer to next venue)    Recommendations for Other Services PT consult;Rehab consult     Precautions / Restrictions Precautions Precautions: Fall Precaution Comments: monitor O2 and HR Restrictions Weight Bearing Restrictions: No      Mobility Bed Mobility Overal bed mobility: Needs Assistance Bed  Mobility: Supine to Sit;Sit to Supine;Rolling Rolling: Mod assist   Supine to sit: Min assist;HOB elevated Sit to supine: Mod assist;HOB elevated   General bed mobility comments: Min A to elevate trunk into sitting. Mod A for managing LB when returning to supine. Pt requiring Mod A for rolling when cleaning after slight BM in bed.  Transfers                 General transfer comment: Defered. RN request no OOB with Afib.    Balance Overall balance assessment: Needs assistance Sitting-balance support: Bilateral upper extremity supported;Feet supported Sitting balance-Leahy Scale: Fair                                     ADL either performed or assessed with clinical judgement   ADL Overall ADL's : Needs assistance/impaired Eating/Feeding: Independent;Bed level   Grooming: Supervision/safety;Set up;Sitting;Bed level;Wash/dry face Grooming Details (indicate cue type and reason): Pt washing his face at EOB with supervision for safety Upper Body Bathing: Minimal assistance;Sitting;Bed level   Lower Body Bathing: Maximal assistance;Bed level   Upper Body Dressing : Minimal assistance;Sitting   Lower Body Dressing: Maximal assistance;Bed level       Toileting- Clothing Manipulation and Hygiene: Total assistance;+2 for safety/equipment;Bed level Toileting - Clothing Manipulation Details (indicate cue type and reason): Slight BM in bed. Mod A for rolling side to side in bed. Total A for toilet hygiene.      Functional  mobility during ADLs: (Defer) General ADL Comments: Evaluation limited to EOB due to A-fib and RN requesting no OOB activity. Pt very appreciative of therapy     Vision         Perception     Praxis      Pertinent Vitals/Pain Pain Assessment: Faces Faces Pain Scale: Hurts even more Pain Location: BLEs Pain Descriptors / Indicators: Constant;Discomfort;Grimacing Pain Intervention(s): Monitored during session;Limited activity within  patient's tolerance;Repositioned     Hand Dominance Right   Extremity/Trunk Assessment Upper Extremity Assessment Upper Extremity Assessment: Generalized weakness   Lower Extremity Assessment Lower Extremity Assessment: Defer to PT evaluation;Generalized weakness(Edema)   Cervical / Trunk Assessment Cervical / Trunk Assessment: Other exceptions Cervical / Trunk Exceptions: Increased body habitus   Communication Communication Communication: No difficulties   Cognition Arousal/Alertness: Awake/alert Behavior During Therapy: WFL for tasks assessed/performed Overall Cognitive Status: Within Functional Limits for tasks assessed                                     General Comments  Wife present. HR 117-129. SpO2 >90% on RA; placed back on 2L at end of session. RR 20-30s. BP supine 95/78, 115/85 with HOB elevated to 52*, 112/61 at EOB, and 107/87 at end of session with pt in bed and bed in chair position.    Exercises Exercises: General Lower Extremity General Exercises - Lower Extremity Quad Sets: AROM;Both;10 reps;Supine(with glut set as well ) Gluteal Sets: AROM;10 reps;Both;Supine(performed with quad set ) Long Arc Quad: Both;Seated;AROM;10 reps Hip Flexion/Marching: AROM;Both;Seated;5 reps   Shoulder Instructions      Home Living Family/patient expects to be discharged to:: Private residence Living Arrangements: Spouse/significant other;Children Available Help at Discharge: Family;Available PRN/intermittently(family is going to try to be with him 24/ 7 if possible) Type of Home: House Home Access: Stairs to enter [employer] of Steps: 5 Entrance Stairs-Rails: Right Home Layout: Multi-level Alternate Level Stairs-Number of Steps: 5 Alternate Level Stairs-Rails: Right Bathroom Shower/Tub: Chief Strategy Officer: Standard     Home Equipment: Environmental consultant - 2 wheels;Bedside commode          Prior Functioning/Environment Level of  Independence: Independent        Comments: Prior to recent weakness, pt was independent and working as a paramedic. within last 3-4 weeks everything became a lot harder, SOB, difficulty moving and this last week could not make it up and down his own steps in his house        OT Problem List: Decreased strength;Decreased range of motion;Decreased activity tolerance;Impaired balance (sitting and/or standing);Decreased knowledge of use of DME or AE;Decreased knowledge of precautions;Pain;Increased edema;Cardiopulmonary status limiting activity      OT Treatment/Interventions: Self-care/ADL training;Therapeutic exercise;Energy conservation;DME and/or AE instruction;Therapeutic activities;Patient/family education    OT Goals(Current goals can be found in the care plan section) Acute Rehab OT Goals Patient Stated Goal: "Get over this and work again" OT Goal Formulation: With patient Time For Goal Achievement: 10/26/18 Potential to Achieve Goals: Good  OT Frequency: Min 3X/week   Barriers to D/C:            Co-evaluation              AM-PAC OT "6 Clicks" Daily Activity     Outcome Measure Help from another person eating meals?: None Help from another person taking care of personal grooming?: A Little Help from another person toileting, which  includes using toliet, bedpan, or urinal?: Total Help from another person bathing (including washing, rinsing, drying)?: A Lot Help from another person to put on and taking off regular upper body clothing?: A Little Help from another person to put on and taking off regular lower body clothing?: A Lot 6 Click Score: 15   End of Session Nurse Communication: Mobility status;Other (comment)(SpO2)  Activity Tolerance: Patient tolerated treatment well;Other (comment)(Limited to EOB only per RN request) Patient left: in bed;with call bell/phone within reach;with family/visitor present  OT Visit Diagnosis: Unsteadiness on feet (R26.81);Other  abnormalities of gait and mobility (R26.89);Muscle weakness (generalized) (M62.81);Pain Pain - Right/Left: (Bilateral) Pain - part of body: Leg                Time: 4356-8616 OT Time Calculation (min): 44 min Charges:  OT General Charges $OT Visit: 1 Visit OT Evaluation $OT Eval Moderate Complexity: 1 Mod OT Treatments $Self Care/Home Management : 8-22 mins $Therapeutic Activity: 8-22 mins  Milfred Krammes MSOT, OTR/L Acute Rehab Pager: [phone] Office: [phone]  Theodoro Grist Joyelle Siedlecki 10/12/2018, 11:26 AM

## 2018-10-12 NOTE — Progress Notes (Signed)
PT Cancellation Note  Patient Details Name: Alix Dedios MRN: [id number] DOB: January 21, 1966   Cancelled Treatment:    Reason Eval/Treat Not Completed: Fatigue/lethargy limiting ability to participate Pt reports feeling nauseous and exhausted from OT session this AM. Requests PT follow up.    Blake Divine A Dajanique Robley 10/12/2018, 2:18 PM Mylo Red, PT, DPT Acute Rehabilitation Services Pager [phone] Office [phone]

## 2018-10-12 NOTE — Progress Notes (Signed)
Pt converted to afib RVR. This is new for this pt.  EKG obtained and notified Dr. Santiago Glad that advised no new orders at this time. Will continue to monitor.

## 2018-10-12 NOTE — Progress Notes (Addendum)
Advanced Heart Failure Rounding Note   Subjective:    [name] 64.2% on milrinone 0.125 mcg/kg/min. Weight in bed this am.   Developed AF with RVR.   Feeling fatigue this am with low appetite. He denies SOB, but appears to be slightly more SOB with conversation. No overt dyspnea. No CP.   CVP 5-6 cm.   Seen by TCTS and and felt not to be candidate for CABG currently  Objective:   Weight Range:  Vital Signs:   Temp:  [97.7 F (36.5 C)-98.7 F (37.1 C)] 97.7 F (36.5 C) (03/15 2338) Pulse Rate:  [61-135] 61 (03/16 0452) Resp:  [9-29] 18 (03/16 0452) BP: (89-138)/(62-119) 90/66 (03/16 0452) SpO2:  [91 %-100 %] 92 % (03/16 0452) Weight:  [136.1 kg] 136.1 kg (03/16 0428) Last BM Date: 10/11/18  Weight change: Filed Weights   10/10/18 0600 10/11/18 0500 10/12/18 0428  Weight: 134.3 kg (!) 137.3 kg (!) 136.1 kg   Intake/Output:   Intake/Output Summary (Last 24 hours) at 10/12/2018 0737 Last data filed at 10/12/2018 0414 Gross per 24 hour  Intake 662.28 ml  Output 850 ml  Net -187.72 ml    Physical Exam   General: Obese. NAD.  HEENT: Normal Neck: Supple. JVP 6. Carotids 2+ bilat; no bruits. No thyromegaly or nodule noted. Cor: PMI nondisplaced. Irregular tachy No M/G/R noted Lungs: CTAB, normal effort. Abdomen: Markedly obese, soft, non-tender, non-distended, no HSM. No bruits or masses. +BS  Extremities: No cyanosis, clubbing, or rash. + UNNA boots  Neuro: Alert & orientedx3, cranial nerves grossly intact. moves all 4 extremities w/o difficulty. Affect pleasant   Telemetry   Afib with RVR 120-130s, personally reviewed.   Labs    Basic Metabolic Panel: Recent Labs  Lab 10/05/18 1241  10/08/18 0604 10/09/18 0418 10/09/18 1047 10/09/18 1051 10/09/18 1202 10/10/18 0506 10/11/18 0322 10/12/18 0452  NA  --    < > 139 139 140 140  --  135 136 133*  K  --    < > 3.4* 3.9 3.8 3.8  --  3.2* 3.8 3.9  CL  --    < > 103 102  --   --   --  101 101 103  CO2  --     < > 30 29  --   --   --  27 27 25   GLUCOSE  --    < > 103* 106*  --   --   --  171* 97 114*  BUN  --    < > 21* 20  --   --   --  17 13 12   CREATININE  --    < > 0.70 0.69  --   --  0.76 0.74 0.79 0.79  CALCIUM  --    < > 7.8* 7.9*  --   --   --  7.6* 8.2* 7.6*  MG 2.1  --   --   --   --   --   --   --   --   --    < > = values in this interval not displayed.   Liver Function Tests: Recent Labs  Lab 10/06/18 0527 10/08/18 0604  AST 52* 62*  ALT 87* 82*  ALKPHOS 108 124  BILITOT 2.9* 2.4*  PROT 5.2* 5.5*  ALBUMIN 2.1* 2.2*   No results for input(s): LIPASE, AMYLASE in the last 168 hours. No results for input(s): AMMONIA in the last 168 hours.  CBC: Recent  Labs  Lab 10/08/18 0604 10/09/18 1047 10/09/18 1051 10/09/18 1202 10/10/18 0506  WBC 9.0  --   --  9.7 7.8  NEUTROABS  --   --   --   --  6.2  HGB 12.8* 13.3 13.6 13.4 11.7*  HCT 41.8 39.0 40.0 42.4 37.1*  MCV 94.8  --   --  92.4 91.6  PLT 141*  --   --  182 164   Cardiac Enzymes: No results for input(s): CKTOTAL, CKMB, CKMBINDEX, TROPONINI in the last 168 hours.  BNP: BNP (last 3 results) Recent Labs    10/01/18 1337 10/01/18 1720  BNP 2,769.2* 2,333.1*   ProBNP (last 3 results) No results for input(s): PROBNP in the last 8760 hours.  Other results:  Imaging: Dg Abd Portable 1v  Result Date: 10/10/2018 CLINICAL DATA:  Abdominal pain EXAM: PORTABLE ABDOMEN - 1 VIEW COMPARISON:  CT 10/01/2018 FINDINGS: Bowel gas pattern is unremarkable. No calcification or bone findings. IMPRESSION: Negative. Electronically Signed   By: Paulina Fusi M.D.   On: 10/10/2018 10:48    Medications:    Scheduled Medications: . aspirin  81 mg Oral Daily  . atorvastatin  80 mg Oral q1800  . Chlorhexidine Gluconate Cloth  6 each Topical Daily  . digoxin  0.125 mg Oral Daily  . enoxaparin (LOVENOX) injection  40 mg Subcutaneous Q24H  . losartan  12.5 mg Oral BID  . mouth rinse  15 mL Mouth Rinse BID  . nicotine  21 mg  Transdermal Daily  . potassium chloride  40 mEq Oral Daily  . sodium chloride flush  10-40 mL Intracatheter Q12H  . sodium chloride flush  3 mL Intravenous Q12H  . spironolactone  12.5 mg Oral QHS  . torsemide  40 mg Oral Daily    Infusions: . sodium chloride    . milrinone 0.125 mcg/kg/min (10/11/18 2144)    PRN Medications: sodium chloride, acetaminophen, ondansetron (ZOFRAN) IV, sodium chloride flush, sodium chloride flush, traMADol, witch hazel-glycerin  Assessment:    Aaron [name] is a 53 y.o. male with h/o of obesity who presented with c/o fluid retention, abdominal swelling, and peripheral edema x 3-4 weeks.  Echo showed new systolic CHF 15%.    CHF team consulted for follow up and treatment. Cath results pending at time of consultation.   Plan/Discussion:    1. Acute systolic CHF -> cardiogenic shock due to iCM - Echo 10/02/18 LVEF 15%, Normal RV, Mod LAE, Mod MR, Mild AI, Mild PI.  - Cath 3/13 showed severe 3v CAD with markedly decompensated hemodynamics - [name] 64.2% on milrinone 0.25 mcg/kg/min. Now in Afib.  - CVP 5-6 cm - Continue torsemide 40 mg daily for now.  - Continue spiro 12.5 mg daily - Continue digoxin 0.125 mg daily.  - Continue losartan 12.5 mg BID.  - No b-blocker yet with shock - Continue UNNA boots - Reinforced fluid restriction to < 2 L daily, sodium restriction to less than 2000 mg daily, and the importance of daily weights.    2. CAD - Severe 3v CAD as above. Has been seen by Dr. Dorris Fetch (TCTS). Not CABG candidate currently given hemodynamics and lack of mobility.  - As the RCA has good collaterals and LAD is focal mid lesion, PCI of the LAD and LCx may be best option later this week - Continue ASA and statin. No b-blocker yet with shock  3. Afib with RVR, new - Stop milrinone. Start amiodarone with bolus.  - This patients CHA2DS2-VASc  Score is at least 3.  - Will need anticoagulation moving forward. Start heparin and plan to start  eliquis 5 mg BID after PCI. If does not convert on amiodarone, will need DCCV (Has been less than 48 hrs so will not need TEE)  4. AKI - Cr 1.44 on admit.  - Cr normal at 0.79 now.   5. Elevated LFTs/shock liver - AST 110 and ALT 268 with total Bili 6.2 on admission. Trended down.  - Likely passive congestion/low output CHF.   6. Morbid obesity - Body mass index is 36.52 kg/m.  - Will need sleep study as outpatient and encouraged weight loss.   7. Hypokalemia - Resolved.  8. Severe deconditioning due to obesity - Will need PT/OT  Length of Stay: [address]  Graciella Freer, [place]  10/12/2018, 7:37 AM  Advanced Heart Failure Team Pager [phone] (M-F; 7a - 4p)  Please contact [facility] for night-coverage after hours (4p -7a ) and weekends on [email or web address]  Patient seen and examined with the above-signed Advanced Practice Provider and/or Housestaff. I personally reviewed laboratory data, imaging studies and relevant notes. I independently examined the patient and formulated the important aspects of the plan. I have edited the note to reflect any of my changes or salient points. I have personally discussed the plan with the patient and/or family.  Volume status and co-ox improved but this am has developed AF with RVR. Agree with stopping milrinone. Denies CP or SOB. Will start IV amio and heparin. Remains very weak. Working with PT/OT. Unable to stand. Not candidate for CABG. I have dicussed with Dr. Kirke Corin. Plan PCI LCX and LAD on Friday (or possibly Wednesday).   Arvilla Meres, MD  12:24 PM

## 2018-10-12 NOTE — Progress Notes (Signed)
Rehab Admissions Coordinator Note:  Patient was screened by Nanine Means for appropriateness for an Inpatient Acute Rehab Consult.  At this time, we will follow for improved participation and tolerance prior to requesting an IP Rehab Consult Order.    Nanine Means 10/12/2018, 3:04 PM  I can be reached at [phone].

## 2018-10-13 LAB — BASIC METABOLIC PANEL
Anion gap: 10 (ref 5–15)
BUN: 16 mg/dL (ref 6–20)
CO2: 23 mmol/L (ref 22–32)
Calcium: 8.2 mg/dL — ABNORMAL LOW (ref 8.9–10.3)
Chloride: 101 mmol/L (ref 98–111)
Creatinine, Ser: 0.9 mg/dL (ref 0.61–1.24)
GFR calc Af Amer: >60 mL/min (ref >60)
Glucose, Bld: 97 mg/dL (ref 70–99)
Potassium: 3.9 mmol/L (ref 3.5–5.1)
Sodium: 134 mmol/L — ABNORMAL LOW (ref 135–145)

## 2018-10-13 LAB — COOXEMETRY PANEL
Carboxyhemoglobin: 2.1 % — ABNORMAL HIGH (ref 0.5–1.5)
Methemoglobin: 0.9 % (ref 0.0–1.5)
O2 Saturation: 63.4 %
Total hemoglobin: 12.5 g/dL (ref 12.0–16.0)

## 2018-10-13 LAB — CBC
HCT: 39.1 % (ref 39.0–52.0)
Hemoglobin: 12.5 g/dL — ABNORMAL LOW (ref 13.0–17.0)
MCH: 28.9 pg (ref 26.0–34.0)
MCHC: 32 g/dL (ref 30.0–36.0)
MCV: 90.5 fL (ref 80.0–100.0)
PLATELETS: 250 10*3/uL (ref 150–400)
RBC: 4.32 MIL/uL (ref 4.22–5.81)
RDW: 16.5 % — ABNORMAL HIGH (ref 11.5–15.5)
WBC: 10.2 10*3/uL (ref 4.0–10.5)
nRBC: 0 % (ref 0.0–0.2)

## 2018-10-13 LAB — HEPARIN LEVEL (UNFRACTIONATED): Heparin Unfractionated: 0.58 IU/mL (ref 0.30–0.70)

## 2018-10-13 MED ORDER — HYDROCORTISONE ACETATE 25 MG RE SUPP
25.0000 mg | Freq: Two times a day (BID) | RECTAL | Status: DC
  Administered 2018-10-13: 25 mg via RECTAL
  Filled 2018-10-13 (×38): qty 1

## 2018-10-13 MED ORDER — AMIODARONE LOAD VIA INFUSION
150.0000 mg | Freq: Once | INTRAVENOUS | Status: AC
  Administered 2018-10-13: 150 mg via INTRAVENOUS
  Filled 2018-10-13: qty 83.34

## 2018-10-13 NOTE — Progress Notes (Signed)
Patient refused CPAP for the night.  Patient states he has never wore a CPAP machine before nor has he been diagnosed with sleep apnea or had a sleep study.  RT will continue to monitor.

## 2018-10-13 NOTE — Progress Notes (Signed)
Physical Therapy Treatment Patient Details Name: Aaron [name] MRN: [id number] DOB: Jul 20, 1966 Today's Date: 10/13/2018    History of Present Illness 53 y.o. 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 New onset of anasacra and CHF with EF of 15%. Transfered to [facility] for cardiac catheterization  on 3/13  and found to have multivessel CAD.    PT Comments    Patient progressing slowly towards PT goals. Requires max encouragement and coaxing to participate in therapy. Tolerated transfers and gait training with Min A for balance/safety however pt refuses to attempt to stand from any surface unless it is fully elevated. Explained importance of mobility and working with therapies despite feeling bad, being tired and nauseous and pt verbalizes understanding however reluctantly doing the work. Tolerated short distance ambulation today with RW- Sp02 ranged from 89-93% on RA and HR ranged from 100-142 bpm A-fib. Encouraged walking to bathroom with OT post PT session (will place elevated BSC over toilet) but pt declined and became irritated. Discharge recommendation to SNF especially if pt's mobility does not improve. Will follow.   Follow Up Recommendations  SNF;Supervision for mobility/OOB     Equipment Recommendations  None recommended by PT    Recommendations for Other Services       Precautions / Restrictions Precautions Precautions: Fall Precaution Comments: monitor O2 and HR Restrictions Weight Bearing Restrictions: No    Mobility  Bed Mobility Overal bed mobility: Needs Assistance Bed Mobility: Supine to Sit     Supine to sit: HOB elevated;Min assist Sit to supine: Mod assist;HOB elevated   General bed mobility comments: Min A to elevate trunk to get to EOB, heavy use of rail.  Assist to bring BLEs into bed to return to supine.  Transfers Overall transfer level: Needs assistance Equipment used: Rolling walker (2 wheeled) Transfers: Sit to/from Stand Sit to Stand: Min assist;From elevated surface         General transfer comment: Would not attempt to stand from surface unless it was really elevated despite encouragement. Despite cues, pt pulling up on RW.   Ambulation/Gait Ambulation/Gait assistance: Min assist Gait Distance (Feet): 30 Feet Assistive device: Rolling walker (2 wheeled) Gait Pattern/deviations: Step-through pattern;Decreased stride length Gait velocity: decreased   General Gait Details: Slow, mildly unsteady gait but no overt LOB. Sp02 ranged from 89-92% on RA, HR ranged from 100-142 bpm A-fib. 2.4 DOE. Declined any further.    Stairs             Wheelchair Mobility    Modified Rankin (Stroke Patients Only)       Balance Overall balance assessment: Needs assistance Sitting-balance support: Bilateral upper extremity supported;Feet supported Sitting balance-Leahy Scale: Fair     Standing balance support: Bilateral upper extremity supported Standing balance-Leahy Scale: Poor Standing balance comment: Reliant on BUEs for support in standing.                             Cognition Arousal/Alertness: Awake/alert Behavior During Therapy: WFL for tasks assessed/performed(gets irritated easily when pushed) Overall Cognitive Status: Within Functional Limits for tasks assessed                                 General Comments:  Does not seem to comprehend the importance of mobility with regards to his health condition; states he want to get better and move however when it comes down to it, always stating excuses re nausea, pain, lack of sleep, food etc       Exercises      General Comments General comments (skin integrity, edema, etc.): Wife present during session. Explained importance of mobility and  working with therapies despite feeling bad, being tired, nauseous etc.       Pertinent Vitals/Pain Pain Assessment: Faces Faces Pain Scale: Hurts little more Pain Location: abdomen Pain Descriptors / Indicators: Constant;Discomfort;Grimacing Pain Intervention(s): Monitored during session;Repositioned;Limited activity within patient's tolerance    Home Living                      Prior Function            PT Goals (current goals can now be found in the care plan section) Progress towards PT goals: Progressing toward goals(very slowly)    Frequency    Min 3X/week      PT Plan Discharge plan needs to be updated    Co-evaluation              AM-PAC PT "6 Clicks" Mobility   Outcome Measure  Help needed turning from your back to your side while in a flat bed without using bedrails?: A Little Help needed moving from lying on your back to sitting on the side of a flat bed without using bedrails?: A Lot Help needed moving to and from a bed to a chair (including a wheelchair)?: A Little Help needed standing up from a chair using your arms (e.g., wheelchair or bedside chair)?: A Little Help needed to walk in hospital room?: A Little Help needed climbing 3-5 steps with a railing? : A Lot 6 Click Score: 16    End of Session Equipment Utilized During Treatment: Gait belt Activity Tolerance: Treatment limited secondary to medical complications (Comment);Patient tolerated treatment well(elevated HR) Patient left: in bed;with bed alarm set;with family/visitor present;with call bell/phone within reach Nurse Communication: Mobility status PT Visit Diagnosis: Muscle weakness (generalized) (M62.81);Difficulty in walking, not elsewhere classified (R26.2);Pain Pain - part of body: (abdomen)     Time: 3143-8887 PT Time Calculation (min) (ACUTE ONLY): 30 min  Charges:  $Gait Training: 8-22 mins $Therapeutic Activity: 8-22 mins                     Mylo Red,  [place], DPT Acute Rehabilitation Services Pager [phone] Office [phone]       Blake Divine A Lanier Ensign 10/13/2018, 11:35 AM

## 2018-10-13 NOTE — Progress Notes (Addendum)
Advanced Heart Failure Rounding Note   Subjective:    [name] 63.4% off milrinone.   Developed AF with RVR overnight 3/16. Started on IV amiodarone. He remains in Afib, but rate much improved to 80-90s.  Feeling better this am. Rate controlled. Nausea has improved  CVP 4-5 but with flat wave form.  Seen by TCTS and and felt not to be candidate for CABG currently. Dr Kirke Corin plans for PCI Wednesday vs Friday.   Objective:   Weight Range:  Vital Signs:   Temp:  [96 F (35.6 C)-98.9 F (37.2 C)] 97.9 F (36.6 C) (03/17 0745) Pulse Rate:  [75-120] 97 (03/17 0745) Resp:  [18-24] 21 (03/17 0745) BP: (94-113)/(68-87) 96/72 (03/17 0745) SpO2:  [92 %-95 %] 94 % (03/17 0745) Weight:  [135.3 kg] 135.3 kg (03/17 0626) Last BM Date: 10/12/18  Weight change: Filed Weights   10/11/18 0500 10/12/18 0428 10/13/18 0626  Weight: (!) 137.3 kg (!) 136.1 kg 135.3 kg   Intake/Output:   Intake/Output Summary (Last 24 hours) at 10/13/2018 0757 Last data filed at 10/13/2018 0700 Gross per 24 hour  Intake 1540.98 ml  Output 1675 ml  Net -134.02 ml    Physical Exam   General: Obese. NAD.  HEENT: Normal Neck: Supple. JVP 6-7. Carotids 2+ bilat; no bruits. No thyromegaly or nodule noted. Cor: PMI nondisplaced. Irregularly irregular, No M/G/R noted Lungs: CTAB, normal effort. Abdomen: Soft, non-tender, non-distended, no HSM. No bruits or masses. +BS  Extremities: No cyanosis, clubbing, or rash. + UNNA boots.  Neuro: Alert & orientedx3, cranial nerves grossly intact. moves all 4 extremities w/o difficulty. Affect pleasant   Telemetry   Afib 80-90s, personally reviewed.   Labs    Basic Metabolic Panel: Recent Labs  Lab 10/09/18 0418  10/09/18 1051 10/09/18 1202 10/10/18 0506 10/11/18 0322 10/12/18 0452 10/13/18 0531  NA 139   < > 140  --  135 136 133* 134*  K 3.9   < > 3.8  --  3.2* 3.8 3.9 3.9  CL 102  --   --   --  101 101 103 101  CO2 29  --   --   --  27 27 25 23    GLUCOSE 106*  --   --   --  171* 97 114* 97  BUN 20  --   --   --  17 13 12 16   CREATININE 0.69  --   --  0.76 0.74 0.79 0.79 0.90  CALCIUM 7.9*  --   --   --  7.6* 8.2* 7.6* 8.2*  MG  --   --   --   --   --   --  2.0  --    < > = values in this interval not displayed.   Liver Function Tests: Recent Labs  Lab 10/08/18 0604  AST 62*  ALT 82*  ALKPHOS 124  BILITOT 2.4*  PROT 5.5*  ALBUMIN 2.2*   No results for input(s): LIPASE, AMYLASE in the last 168 hours. No results for input(s): AMMONIA in the last 168 hours.  CBC: Recent Labs  Lab 10/08/18 0604 10/09/18 1047 10/09/18 1051 10/09/18 1202 10/10/18 0506 10/13/18 0531  WBC 9.0  --   --  9.7 7.8 10.2  NEUTROABS  --   --   --   --  6.2  --   HGB 12.8* 13.3 13.6 13.4 11.7* 12.5*  HCT 41.8 39.0 40.0 42.4 37.1* 39.1  MCV 94.8  --   --  92.4 91.6 90.5  PLT 141*  --   --  182 164 250   Cardiac Enzymes: No results for input(s): CKTOTAL, CKMB, CKMBINDEX, TROPONINI in the last 168 hours.  BNP: BNP (last 3 results) Recent Labs    10/01/18 1337 10/01/18 1720  BNP 2,769.2* 2,333.1*   ProBNP (last 3 results) No results for input(s): PROBNP in the last 8760 hours.  Other results:  Imaging: No results found.  Medications:    Scheduled Medications: . aspirin  81 mg Oral Daily  . atorvastatin  80 mg Oral q1800  . Chlorhexidine Gluconate Cloth  6 each Topical Daily  . digoxin  0.125 mg Oral Daily  . losartan  12.5 mg Oral BID  . mouth rinse  15 mL Mouth Rinse BID  . nicotine  21 mg Transdermal Daily  . potassium chloride  40 mEq Oral Daily  . sodium chloride flush  10-40 mL Intracatheter Q12H  . sodium chloride flush  3 mL Intravenous Q12H  . spironolactone  12.5 mg Oral QHS  . torsemide  40 mg Oral Daily    Infusions: . sodium chloride    . amiodarone 30 mg/hr (10/13/18 0729)  . heparin 1,950 Units/hr (10/13/18 0728)    PRN Medications: sodium chloride, acetaminophen, alum & mag hydroxide-simeth,  ondansetron (ZOFRAN) IV, sodium chloride flush, sodium chloride flush, traMADol, witch hazel-glycerin  Assessment:   Aaron [name] is a 53 y.o. male with h/o of obesity who presented with c/o fluid retention, abdominal swelling, and peripheral edema x 3-4 weeks.  Echo showed new systolic CHF 15%.    CHF team consulted for follow up and treatment. Cath results pending at time of consultation.   Plan/Discussion:    1. Acute systolic CHF -> cardiogenic shock due to iCM - Echo 10/02/18 LVEF 15%, Normal RV, Mod LAE, Mod MR, Mild AI, Mild PI.  - Cath 3/13 showed severe 3v CAD with markedly decompensated hemodynamics - [name] 63.4% off milrinone. Remains in Afib.  - CVP 4-5 but with flat wave-form - Continue torsemide 40 mg daily for now.  - Continue spiro 12.5 mg daily - Continue digoxin 0.125 mg daily.  - Continue losartan 12.5 mg BID.  - No b-blocker yet with shock - Continue UNNA boots  2. CAD - Severe 3v CAD as above. Has been seen by Dr. Dorris Fetch (TCTS). Not CABG candidate currently given hemodynamics and lack of mobility.  - As the RCA has good collaterals and LAD is focal mid lesion, PCI of the LAD and LCx may be best option later this week per Dr. Kirke Corin.  - Continue ASA and statin. No b-blocker yet with shock  3. Afib with RVR, new - Off milrinone and on IV amiodarone.   - This patients CHA2DS2-VASc Score is at least 3.  - He is on heparin for now with plans for PCI later this week.  - Will plan on Eliquis 5 mg BID post procedure. If does not convert on amiodarone, will need DCCV.   4. AKI - Cr 1.44 on admit.  - Cr 0.90 today.   5. Elevated LFTs/shock liver - AST 110 and ALT 268 with total Bili 6.2 on admission. Trended down.  - Likely passive congestion/low output CHF.   6. Morbid obesity - Body mass index is 36.31 kg/m.  - Will need sleep study as outpatient and encouraged weight loss.   7. Hypokalemia - K 3.9 this am.   8. Severe deconditioning due to obesity  - PT and CIR following.  Length of Stay: [address]  [employer]  10/13/2018, 7:57 AM  Advanced Heart Failure Team Pager [phone] (M-F; 7a - 4p)  Please contact [facility] for night-coverage after hours (4p -7a ) and weekends on [email or web address]  Patient seen and examined with the above-signed Advanced Practice Provider and/or Housestaff. I personally reviewed laboratory data, imaging studies and relevant notes. I independently examined the patient and formulated the important aspects of the plan. I have edited the note to reflect any of my changes or salient points. I have personally discussed the plan with the patient and/or family.  Remains in AF but otherwise improving. Milrinone stopped yesterday and co-ox stable today at 63%. Volume status ok on po torsemide. BP too soft to titrate meds. Will continue IV amio and heparin. Possible PCI of LAD and LCX Firday (or possibly tomorrow - pending clinical course and cath schedule). I am concerned about his motivation to truly engage in Rehab. Will need continues encouragement to participate.   Arvilla Meres, MD  10:13 AM

## 2018-10-13 NOTE — Progress Notes (Signed)
ANTICOAGULATION CONSULT NOTE  Pharmacy Consult for heparin Indication: atrial fibrillation  No Known Allergies  Patient Measurements: Height: 6\' 4"  (193 cm) Weight: 298 lb 4.8 oz (135.3 kg) IBW/kg (Calculated) : 86.8 Heparin Dosing Weight: 117kg  Vital Signs: Temp: 97.9 F (36.6 C) (03/17 0745) Temp Source: Oral (03/17 0745) BP: 96/72 (03/17 0745) Pulse Rate: 97 (03/17 0745)  Labs: Recent Labs    10/11/18 0322 10/12/18 0452 10/12/18 1950 10/13/18 0531  HGB  --   --   --  12.5*  HCT  --   --   --  39.1  PLT  --   --   --  250  HEPARINUNFRC  --   --  0.28* 0.58  CREATININE 0.79 0.79  --  0.90    Estimated Creatinine Clearance: 140.9 mL/min (by C-G formula based on SCr of 0.9 mg/dL).   Medical History: Past Medical History:  Diagnosis Date  . Morbid obesity (HCC)     Assessment: 44 YOM with new onset Afib, not on anticoagulation PTA and last dose of lovenox (DVT ppx dosing) on 3/15 @0800 , starting heparin with plans for long-term [facility] after PCI this week.  H/H 11.7/37.1, plts wnl on last check.   Heparin level 0.58 at goal on heparin drip 1950 uts/hr.  No s/sx of bleeding.CBC ok  No infusion issues per nursing.     Goal of Therapy:  Heparin level 0.3-0.7 units/ml Monitor platelets by anticoagulation protocol: Yes   Plan:  Continue heparin infusion  1950 units/hr Monitor daily HL, CBC, and for s/sx of bleeding  Leota Sauers Pharm.D. CPP, BCPS Clinical Pharmacist [phone] 10/13/2018 9:46 AM

## 2018-10-13 NOTE — Progress Notes (Signed)
Occupational Therapy Treatment Patient Details Name: Aaron [name] MRN: [id number] DOB: 06-May-1966 Today's Date: 10/13/2018    History of Present Illness 53 y.o. 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 New onset of anasacra and CHF with EF of 15%. Transfered to [facility] for cardiac catheterization  on 3/13  and found to have multivessel CAD.   Clinical Impression   Pt progressing towards established OT goals. Pt performing functional mobility to bathroom to perform toileting (BSC over toilet) with Min A for balance. Pt continues to present with decreased strength, balance, and activity tolerance. Pt eager to participate in theraband exericses and provided yellow theraband and education on UE exercises for when pt is in bed and seated. Continue to recommend dc to post-acute rehab and will continue to follow acutely as admitted.   HR elevating to 130s and RR in 30s during activity. SpO2 >90% on RA throughout.    Follow Up Recommendations  CIR;Supervision/Assistance - 24 hour    Equipment Recommendations  Other (comment)(Defer to next venue)    Recommendations for Other Services PT consult;Rehab consult     Precautions / Restrictions Precautions Precautions: Fall Precaution Comments: monitor O2 and HR Restrictions Weight Bearing Restrictions: No      Mobility Bed Mobility Overal bed mobility: Needs Assistance Bed Mobility: Supine to Sit;Sit to Supine     Supine to sit: Min guard;HOB elevated Sit to supine: Min guard   General bed mobility comments: Min Guard A for safety and use of bed rail to pull  Transfers Overall transfer level: Needs assistance Equipment used: Rolling walker (2 wheeled) Transfers: Sit to/from Stand Sit to Stand: Min assist;From elevated surface          General transfer comment: Min A to power up into standing     Balance Overall balance assessment: Needs assistance Sitting-balance support: Bilateral upper extremity supported;Feet supported Sitting balance-Leahy Scale: Fair     Standing balance support: Bilateral upper extremity supported Standing balance-Leahy Scale: Poor Standing balance comment: reliant on atleast one UE for support                           ADL either performed or assessed with clinical judgement   ADL Overall ADL's : Needs assistance/impaired     Grooming: Supervision/safety;Set up;Sitting;Wash/dry hands Grooming Details (indicate cue type and reason): Pt fatiguing and feeling dizzy after toileting, so returned to EOB and provided hand saniziter                 Toilet Transfer: Minimal assistance;Ambulation;BSC;Grab bars;RW(BSC over toilet) Toilet Transfer Details (indicate cue type and reason): Min A to power up into standing from [facility]. Toileting- Clothing Manipulation and Hygiene: Minimal assistance;Sit to/from stand Toileting - Clothing Manipulation Details (indicate cue type and reason): Min A for standing balance and managing gown like pt performed peri care after BM at toilet     Functional mobility during ADLs: Min guard;Rolling walker General ADL Comments: Pt eager to participate when OT arrived. Pt performing mobility to toilet and performing toilet hygiene with Min A. Providing pt with education on theraband exercises.      Vision         Perception     Praxis      Pertinent Vitals/Pain Pain Assessment: Faces Faces Pain  Scale: Hurts little more Pain Location: abdomen Pain Descriptors / Indicators: Constant;Discomfort;Grimacing Pain Intervention(s): Monitored during session;Limited activity within patient's tolerance;Repositioned     Hand Dominance     Extremity/Trunk Assessment Upper Extremity Assessment Upper Extremity Assessment: Generalized weakness   Lower  Extremity Assessment Lower Extremity Assessment: Defer to PT evaluation       Communication     Cognition Arousal/Alertness: Awake/alert Behavior During Therapy: WFL for tasks assessed/performed(gets irritated easily when pushed) Overall Cognitive Status: Within Functional Limits for tasks assessed                                 General Comments: Does not seem to comprehend the importance of mobility with regards to his health condition; states he want to get better and move however when it comes down to it, always stating excuses re nausea, pain, lack of sleep, food etc    General Comments  Wife present throughotu session. SpO2 >90% on RA. HR elevating to 130s. RR elevating to 30s with activity    Exercises Exercises: General Upper Extremity General Exercises - Upper Extremity Shoulder Horizontal ABduction: AROM;Both;10 reps;Theraband;Supine(bed in chair position) Theraband Level (Shoulder Horizontal Abduction): Level 1 (Yellow) Shoulder Horizontal ADduction: AROM;Both;10 reps;Theraband;Supine(bed in chair position) Theraband Level (Shoulder Horizontal Adduction): Level 1 (Yellow) Elbow Flexion: AROM;Both;10 reps;Theraband;Supine(bed in chair position) Theraband Level (Elbow Flexion): Level 1 (Yellow) Elbow Extension: AROM;Both;10 reps;Supine;Theraband(bed in chair position) Theraband Level (Elbow Extension): Level 1 (Yellow)   Shoulder Instructions      Home Living                                          Prior Functioning/Environment                   OT Problem List:        OT Treatment/Interventions:      OT Goals(Current goals can be found in the care plan section) Acute Rehab OT Goals Patient Stated Goal: "Get over this and work again" OT Goal Formulation: With patient Time For Goal Achievement: 10/26/18 Potential to Achieve Goals: Good ADL Goals Pt Will Perform Grooming: with min guard assist;standing Pt Will Perform  Upper Body Dressing: with set-up;with supervision;sitting Pt Will Perform Lower Body Dressing: with min guard assist;with adaptive equipment;sit to/from stand Pt Will Transfer to Toilet: with min assist;ambulating;bedside commode Pt Will Perform Toileting - Clothing Manipulation and hygiene: with min guard assist;sitting/lateral leans;sit to/from stand;with adaptive equipment Pt/caregiver will Perform Home Exercise Program: Increased ROM;Increased strength;Both right and left upper extremity;With written HEP provided;Independently Additional ADL Goal #1: Pt will perform bed mobility with supervision in preparation for ADLs  OT Frequency: Min 3X/week   Barriers to D/C:            Co-evaluation              AM-PAC OT "6 Clicks" Daily Activity     Outcome Measure Help from another person eating meals?: None Help from another person taking care of personal grooming?: A Little Help from another person toileting, which includes using toliet, bedpan, or urinal?: Total Help from another person bathing (including washing, rinsing, drying)?: A Lot Help from another person to put on and taking off regular upper body clothing?: A Little Help from another person to put on and taking off regular lower body clothing?:  A Lot 6 Click Score: 15   End of Session Equipment Utilized During Treatment: Engineer, water Communication: Mobility status;Other (comment)(SpO2)  Activity Tolerance: Patient tolerated treatment well;Other (comment)(Limited to EOB only per RN request) Patient left: in bed;with call bell/phone within reach;with family/visitor present  OT Visit Diagnosis: Unsteadiness on feet (R26.81);Other abnormalities of gait and mobility (R26.89);Muscle weakness (generalized) (M62.81);Pain Pain - Right/Left: (Bilateral) Pain - part of body: Leg                Time: [id number] OT Time Calculation (min): 32 min Charges:  OT General Charges $OT Visit: 1 Visit OT Treatments $Self  Care/Home Management : 23-37 mins  Ishmel Acevedo MSOT, OTR/L Acute Rehab Pager: [phone] Office: [phone]  Theodoro Grist Boris Engelmann 10/13/2018, 2:45 PM

## 2018-10-14 ENCOUNTER — Encounter (HOSPITAL_COMMUNITY): Payer: Self-pay | Admitting: Physical Medicine and Rehabilitation

## 2018-10-14 DIAGNOSIS — R000 Tachycardia, unspecified: Secondary | ICD-10-CM

## 2018-10-14 DIAGNOSIS — J9601 Acute respiratory failure with hypoxia: Secondary | ICD-10-CM

## 2018-10-14 DIAGNOSIS — R0602 Shortness of breath: Secondary | ICD-10-CM

## 2018-10-14 DIAGNOSIS — R0682 Tachypnea, not elsewhere classified: Secondary | ICD-10-CM

## 2018-10-14 DIAGNOSIS — I4891 Unspecified atrial fibrillation: Secondary | ICD-10-CM

## 2018-10-14 DIAGNOSIS — E876 Hypokalemia: Secondary | ICD-10-CM

## 2018-10-14 LAB — CBC
HCT: 38.3 % — ABNORMAL LOW (ref 39.0–52.0)
Hemoglobin: 11.9 g/dL — ABNORMAL LOW (ref 13.0–17.0)
MCH: 27.6 pg (ref 26.0–34.0)
MCHC: 31.1 g/dL (ref 30.0–36.0)
MCV: 88.9 fL (ref 80.0–100.0)
NRBC: 0 % (ref 0.0–0.2)
Platelets: 274 10*3/uL (ref 150–400)
RBC: 4.31 MIL/uL (ref 4.22–5.81)
RDW: 16.3 % — ABNORMAL HIGH (ref 11.5–15.5)
WBC: 8.8 10*3/uL (ref 4.0–10.5)

## 2018-10-14 LAB — COOXEMETRY PANEL
Carboxyhemoglobin: 1.5 % (ref 0.5–1.5)
Methemoglobin: 1.4 % (ref 0.0–1.5)
O2 Saturation: 64 %
Total hemoglobin: 12.3 g/dL (ref 12.0–16.0)

## 2018-10-14 LAB — BASIC METABOLIC PANEL
Anion gap: 6 (ref 5–15)
BUN: 14 mg/dL (ref 6–20)
CO2: 28 mmol/L (ref 22–32)
Calcium: 8.1 mg/dL — ABNORMAL LOW (ref 8.9–10.3)
Chloride: 101 mmol/L (ref 98–111)
Creatinine, Ser: 0.94 mg/dL (ref 0.61–1.24)
GFR calc Af Amer: >60 mL/min (ref >60)
GFR calc non Af Amer: >60 mL/min (ref >60)
Glucose, Bld: 98 mg/dL (ref 70–99)
Potassium: 3.3 mmol/L — ABNORMAL LOW (ref 3.5–5.1)
SODIUM: 135 mmol/L (ref 135–145)

## 2018-10-14 LAB — HEPARIN LEVEL (UNFRACTIONATED): Heparin Unfractionated: 0.83 IU/mL — ABNORMAL HIGH (ref 0.30–0.70)

## 2018-10-14 MED ORDER — POTASSIUM CHLORIDE CRYS ER 20 MEQ PO TBCR
40.0000 meq | EXTENDED_RELEASE_TABLET | Freq: Once | ORAL | Status: AC
  Administered 2018-10-14: 40 meq via ORAL
  Filled 2018-10-14: qty 2

## 2018-10-14 MED ORDER — CLOPIDOGREL BISULFATE 75 MG PO TABS
300.0000 mg | ORAL_TABLET | Freq: Once | ORAL | Status: AC
  Administered 2018-10-14: 300 mg via ORAL
  Filled 2018-10-14: qty 4

## 2018-10-14 MED ORDER — AMIODARONE HCL 200 MG PO TABS
400.0000 mg | ORAL_TABLET | Freq: Two times a day (BID) | ORAL | Status: DC
  Administered 2018-10-14 – 2018-10-15 (×4): 400 mg via ORAL
  Filled 2018-10-14 (×5): qty 2

## 2018-10-14 MED ORDER — CLOPIDOGREL BISULFATE 75 MG PO TABS
75.0000 mg | ORAL_TABLET | Freq: Every day | ORAL | Status: DC
  Administered 2018-10-15: 75 mg via ORAL
  Filled 2018-10-14: qty 1

## 2018-10-14 NOTE — Progress Notes (Addendum)
Advanced Heart Failure Rounding Note   Subjective:    [name] 64% off milrinone.   He remains in Afib, rate much improved into 70-80s.  Feeling better this am. Working with therapy. Walked halls yesterday and has a theraband for exercises.   CVP ~ 4-5 on my check, but flattened waveform.   Seen by TCTS and and felt not to be candidate for CABG currently. Dr Kirke Corin plans for PCI later this week.   Objective:   Weight Range:  Vital Signs:   Temp:  [97.7 F (36.5 C)-98 F (36.7 C)] 97.9 F (36.6 C) (03/18 0450) Pulse Rate:  [84-94] 93 (03/18 0450) Resp:  [13-25] 13 (03/18 0450) BP: (88-96)/(68-74) 90/71 (03/18 0450) SpO2:  [92 %-95 %] 92 % (03/18 0450) Weight:  [131.9 kg] 131.9 kg (03/18 0450) Last BM Date: 10/12/18  Weight change: Filed Weights   10/12/18 0428 10/13/18 0626 10/14/18 0450  Weight: (!) 136.1 kg 135.3 kg 131.9 kg   Intake/Output:   Intake/Output Summary (Last 24 hours) at 10/14/2018 0756 Last data filed at 10/14/2018 0713 Gross per 24 hour  Intake 1935.52 ml  Output 2600 ml  Net -664.48 ml    Physical Exam   General: NAD HEENT: Normal Neck: Supple. JVP not elevated. Carotids 2+ bilat; no bruits. No thyromegaly or nodule noted. Cor: PMI nondisplaced. Irregular, No M/G/R noted Lungs: CTAB, normal effort. Abdomen: Obese, soft, non-tender, non-distended, no HSM. No bruits or masses. +BS  Extremities: No cyanosis, clubbing, or rash. + UNNA boots  + PICC Neuro: Alert & orientedx3, cranial nerves grossly intact. moves all 4 extremities w/o difficulty. Affect pleasant   Telemetry   Afib 70-90s, personally reviewed.   Labs    Basic Metabolic Panel: Recent Labs  Lab 10/10/18 0506 10/11/18 0322 10/12/18 0452 10/13/18 0531 10/14/18 0254  NA 135 136 133* 134* 135  K 3.2* 3.8 3.9 3.9 3.3*  CL 101 101 103 101 101  CO2 27 27 25 23 28   GLUCOSE 171* 97 114* 97 98  BUN 17 13 12 16 14   CREATININE 0.74 0.79 0.79 0.90 0.94  CALCIUM 7.6* 8.2* 7.6*  8.2* 8.1*  MG  --   --  2.0  --   --    Liver Function Tests: Recent Labs  Lab 10/08/18 0604  AST 62*  ALT 82*  ALKPHOS 124  BILITOT 2.4*  PROT 5.5*  ALBUMIN 2.2*   No results for input(s): LIPASE, AMYLASE in the last 168 hours. No results for input(s): AMMONIA in the last 168 hours.  CBC: Recent Labs  Lab 10/08/18 0604  10/09/18 1051 10/09/18 1202 10/10/18 0506 10/13/18 0531 10/14/18 0254  WBC 9.0  --   --  9.7 7.8 10.2 8.8  NEUTROABS  --   --   --   --  6.2  --   --   HGB 12.8*   < > 13.6 13.4 11.7* 12.5* 11.9*  HCT 41.8   < > 40.0 42.4 37.1* 39.1 38.3*  MCV 94.8  --   --  92.4 91.6 90.5 88.9  PLT 141*  --   --  182 164 250 274   < > = values in this interval not displayed.   Cardiac Enzymes: No results for input(s): CKTOTAL, CKMB, CKMBINDEX, TROPONINI in the last 168 hours.  BNP: BNP (last 3 results) Recent Labs    10/01/18 1337 10/01/18 1720  BNP 2,769.2* 2,333.1*   ProBNP (last 3 results) No results for input(s): PROBNP in the last  8760 hours.  Other results:  Imaging: No results found.  Medications:    Scheduled Medications: . aspirin  81 mg Oral Daily  . atorvastatin  80 mg Oral q1800  . Chlorhexidine Gluconate Cloth  6 each Topical Daily  . digoxin  0.125 mg Oral Daily  . hydrocortisone  25 mg Rectal BID  . losartan  12.5 mg Oral BID  . mouth rinse  15 mL Mouth Rinse BID  . nicotine  21 mg Transdermal Daily  . potassium chloride  40 mEq Oral Daily  . potassium chloride  40 mEq Oral Once  . sodium chloride flush  10-40 mL Intracatheter Q12H  . sodium chloride flush  3 mL Intravenous Q12H  . spironolactone  12.5 mg Oral QHS  . torsemide  40 mg Oral Daily    Infusions: . sodium chloride    . amiodarone 30 mg/hr (10/14/18 0513)  . heparin 1,750 Units/hr (10/14/18 0511)    PRN Medications: sodium chloride, acetaminophen, alum & mag hydroxide-simeth, ondansetron (ZOFRAN) IV, sodium chloride flush, sodium chloride flush, traMADol, witch  hazel-glycerin  Assessment:   Kahlif Berkman is a 53 y.o. male with h/o of obesity who presented with c/o fluid retention, abdominal swelling, and peripheral edema x 3-4 weeks.  Echo showed new systolic CHF 15%.    CHF team consulted for follow up and treatment. Cath results pending at time of consultation.   Plan/Discussion:    1. Acute systolic CHF -> cardiogenic shock due to iCM - Echo 10/02/18 LVEF 15%, Normal RV, Mod LAE, Mod MR, Mild AI, Mild PI.  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  - CVP stable at 4-5 but with flat wave-form - Continue torsemide 40 mg daily  - Continue spiro 12.5 mg daily - Continue digoxin 0.125 mg daily.  - Continue losartan 12.5 mg BID.  - No b-blocker yet with shock - Continue UNNA boots  2. CAD - Severe 3v CAD as above. Has been seen by Dr. Dorris Fetch (TCTS). Not CABG candidate currently given hemodynamics and lack of mobility.  - No s/s of ischemia.    - As the RCA has good collaterals and LAD is focal mid lesion, PCI of the LAD and LCx may be best option later this week per Dr. Kirke Corin.  - Continue ASA and statin. No b-blocker yet with shock  3. Afib with RVR, new - Off milrinone and on IV amiodarone.  Rate controlled. - This patients CHA2DS2-VASc Score is at least 3.  - He is on heparin for now with plans for PCI later this week.  - Will plan on Eliquis 5 mg BID post procedure. If does not convert on amiodarone, will need DCCV.   4. AKI - Cr 1.44 on admit.  - Cr 0.94 today.   5. Elevated LFTs/shock liver - AST 110 and ALT 268 with total Bili 6.2 on admission. Trended down as of 3/12. - Likely passive congestion/low output CHF.   6. Morbid obesity - Body mass index is 35.39 kg/m.  - Will need sleep study as outpatient and encouraged weight loss.   7. Hypokalemia - K 3.3 this am. Will supp.   8. Severe deconditioning due to obesity - PT and CIR following. Encouraged  participation.   Length of Stay: [address]  [employer]  10/14/2018, 7:56 AM  Advanced Heart Failure Team Pager [phone] (M-F; 7a - 4p)  Please contact [facility] for night-coverage after hours (4p -  7a ) and weekends on [email or web address]   Patient seen and examined with the above-signed Advanced Practice Provider and/or Housestaff. I personally reviewed laboratory data, imaging studies and relevant notes. I independently examined the patient and formulated the important aspects of the plan. I have edited the note to reflect any of my changes or salient points. I have personally discussed the plan with the patient and/or family.  Remains in AF but rate well controlled on IV amio. Can switch to po now that he is off milrinone. Co-ox stable off milrinone. BP soft. Weight coming down. Would hold torsemide for 1-2 days. He is should be ready for PCI. I will discuss with Dr. Kirke Corin about timing (either later today or Friday). Stressed need for continued PT. Supp K. Continue heparin until after PCI then can switch to Eliquis.   Arvilla Meres, MD  9:06 AM

## 2018-10-14 NOTE — TOC Progression Note (Addendum)
Transition of Care [facility]) - Progression Note    Patient Details  Name: Aaron [name] MRN: [id number] Date of Birth: [birth year]  Transition of Care [facility]) CM/SW Contact  Graves-Bigelow, Lamar Laundry, RN Phone Number: 10/14/2018, 2:29 PM  Clinical Narrative:   CM still following progression of care. Per MD notes patient is not a candidate for CABG- Plan for PCI 10-16-18. Spouse a the bedside- pt is without insurance and financial counselor has seen patient over at [facility]. CM received consult for Eliquis Cost- Pt would pay out of pocket at cost.  Patient has charity home health care by [facility]. CIR is following. CM did try to call [facility] with CIR to address if pt may be a candidate. CM will continue to monitor for additional transition of care needs.     Expected Discharge Plan: Home w Home Health Services    Expected Discharge Plan and Services Expected Discharge Plan: Home w Home Health Services   Post Acute Care Choice: Home Health Living arrangements for the past 2 months: Single Family Home Expected Discharge Date: (unknown)                         Social Determinants of Health (SDOH) Interventions    Readmission Risk Interventions 30 Day Unplanned Readmission Risk Score     ED to Hosp-Admission (Current) from 10/01/2018 in [place] 6E Progressive Care  30 Day Unplanned Readmission Risk Score (%)  14 Filed at 10/14/2018 1200     This score is the patient's risk of an unplanned readmission within 30 days of being discharged (0 -100%). The score is based on dignosis, age, lab data, medications, orders, and past utilization.   Low:  0-14.9   Medium: 15-21.9   High: 22-29.9   Extreme: 30 and above       No flowsheet data found.

## 2018-10-14 NOTE — Progress Notes (Signed)
Physical Therapy Treatment Patient Details Name: Aaron [name] MRN: [id number] DOB: 1965/12/13 Today's Date: 10/14/2018    History of Present Illness 53 y.o. 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 New onset of anasacra and CHF with EF of 15%. Transfered to [facility] for cardiac catheterization  on 3/13  and found to have multivessel CAD.    PT Comments    Pt admitted with above diagnosis. Pt currently with functional limitations due to the deficits listed below (see PT Problem List). Pt limited by soft BP with dizziness in standing. UInable to walk today due to BP 82/57 with pt very diaphoretic and dizzy.  Will return at later date.  Notified nursing of BP.  Pt will benefit from skilled PT to increase their independence and safety with mobility to allow discharge to the venue listed below.     Follow Up Recommendations  SNF;Supervision for mobility/OOB     Equipment Recommendations  None recommended by PT    Recommendations for Other Services       Precautions / Restrictions Precautions Precautions: Fall Precaution Comments: monitor O2 and HR Restrictions Weight Bearing Restrictions: No    Mobility  Bed Mobility Overal bed mobility: Needs Assistance Bed Mobility: Supine to Sit;Sit to Supine     Supine to sit: Min guard;HOB elevated Sit to supine: Min guard   General bed mobility comments: Min Guard A for safety and use of bed rail to pull  Transfers Overall transfer level: Needs assistance Equipment used: Rolling walker (2 wheeled) Transfers: Sit to/from Stand Sit to Stand: From elevated surface;Min guard         General transfer comment: No assist with bed raised. Once pt up, pt reports dizziness. BP 82/57 after sitting 3 minutes.  Layed pt back down and BP 91/74 and  still feeling poorly.  After 5 min, BP was 102/72.  notified nursing.   Ambulation/Gait             General Gait Details: unable due to dizziness   Stairs             Wheelchair Mobility    Modified Rankin (Stroke Patients Only)       Balance Overall balance assessment: Needs assistance Sitting-balance support: Feet supported;No upper extremity supported Sitting balance-Leahy Scale: Fair     Standing balance support: Bilateral upper extremity supported Standing balance-Leahy Scale: Poor Standing balance comment: reliant on atleast one UE for support                            Cognition Arousal/Alertness: Awake/alert Behavior During Therapy: WFL for tasks assessed/performed(gets irritated easily when pushed) Overall Cognitive Status: Within Functional Limits for tasks assessed                                        Exercises General Exercises - Upper Extremity Shoulder Horizontal ABduction: AROM;Both;10 reps;Theraband;Supine(bed in chair position) Theraband Level (Shoulder Horizontal Abduction): Level 1 (Yellow) Shoulder Horizontal ADduction: AROM;Both;10 reps;Theraband;Supine(bed in chair position) Theraband Level (Shoulder Horizontal Adduction): Level 1 (Yellow) Elbow Flexion: AROM;Both;10 reps;Theraband;Supine(bed in chair position) Theraband Level (Elbow Flexion): Level 1 (Yellow) Elbow Extension: AROM;Both;10 reps;Supine;Theraband(bed in chair position) Theraband Level (Elbow Extension):  Level 1 (Yellow) General Exercises - Lower Extremity Ankle Circles/Pumps: AROM;10 reps;Both;Supine Quad Sets: AROM;Both;10 reps;Supine(with glut set as well ) Heel Slides: AAROM;10 reps;Both;Supine Straight Leg Raises: AAROM;Both;Supine;5 reps    General Comments General comments (skin integrity, edema, etc.): Wife present.        Pertinent Vitals/Pain Pain Assessment: Faces Faces Pain Scale: Hurts little more Pain Location:  abdomen Pain Descriptors / Indicators: Constant;Discomfort;Grimacing Pain Intervention(s): Limited activity within patient's tolerance;Monitored during session;Premedicated before session;Repositioned    Home Living                      Prior Function            PT Goals (current goals can now be found in the care plan section) Acute Rehab PT Goals Patient Stated Goal: "Get over this and work again" Progress towards PT goals: Progressing toward goals    Frequency    Min 3X/week      PT Plan Current plan remains appropriate    Co-evaluation              AM-PAC PT "6 Clicks" Mobility   Outcome Measure  Help needed turning from your back to your side while in a flat bed without using bedrails?: A Little Help needed moving from lying on your back to sitting on the side of a flat bed without using bedrails?: A Little Help needed moving to and from a bed to a chair (including a wheelchair)?: A Little Help needed standing up from a chair using your arms (e.g., wheelchair or bedside chair)?: A Little Help needed to walk in hospital room?: A Little Help needed climbing 3-5 steps with a railing? : A Lot 6 Click Score: 17    End of Session Equipment Utilized During Treatment: Gait belt;Oxygen Activity Tolerance: Patient limited by fatigue(limited by dizziness) Patient left: in bed;with bed alarm set;with family/visitor present;with call bell/phone within reach Nurse Communication: Mobility status PT Visit Diagnosis: Muscle weakness (generalized) (M62.81);Difficulty in walking, not elsewhere classified (R26.2);Pain Pain - part of body: (abdomen)     Time: 1224-4975 PT Time Calculation (min) (ACUTE ONLY): 24 min  Charges:  $Therapeutic Exercise: 8-22 mins $Therapeutic Activity: 8-22 mins                     Zeniah Briney,PT Acute Rehabilitation Services Pager:  [phone]  Office:  [phone]     Berline Lopes 10/14/2018, 1:52 PM

## 2018-10-14 NOTE — Consult Note (Signed)
Physical Medicine and Rehabilitation Consult   Reason for Consult: Debility  Referring Physician: Dr. Haroldine Laws.    HPI: Aaron [name] is a 53 y.o. male who was admitted on 10/01/2018 with 3-4 weeks with abdominal pain, peripheral edema and SOB.  History taken from chart review and patient.  He was noted to have anasarca with abnormal LFTs,  AKI and RLL effusion with concerns of PNA due to leucocytosis. Abdominal ultrasound revealed cholelithiasis with fatty liver and liver doppler negative for thrombosis. 2 D echo done revealing diffuse hypokinesis with ejection fraction of 15%.  Dr. Harrell Gave consulted for input and and felt that AKI/ elevated LFTs likely due to congestion. He underwent cardiac cath on 10/09/2018 by Dr. 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  Fluid overload improving with medication adjustment and milrinone on board.  He developed A fib with RVR and placed on IV amiodarone for rate control.  Patient not felt to be a candidate for CABG per Dr. Roxan Hockey and being treated medically. Plans for PCI of LAD/LCx later this week as well as DCCV is does not convert on amiodarone.  Therapy ongoing and patient limited by poor effort, issues with cardia as well as DOE. CIR recommended due to debility.    Review of Systems  Constitutional: Positive for malaise/fatigue. Negative for chills.  HENT: Negative for hearing loss.   Eyes: Negative for blurred vision and double vision.  Respiratory: Positive for shortness of breath. Negative for cough and hemoptysis.   Cardiovascular: Positive for leg swelling. Negative for chest pain and palpitations.  Gastrointestinal: Positive for nausea. Negative for abdominal pain.  Musculoskeletal: Positive for myalgias.  Skin: Negative for itching and rash.  Neurological: Positive for focal weakness and weakness. Negative for dizziness and headaches.  Psychiatric/Behavioral: The patient is  nervous/anxious.   All other systems reviewed and are negative.     Past Medical History:  Diagnosis Date  . Morbid obesity ([place])     Past Surgical History:  Procedure Laterality Date  . RIGHT/LEFT HEART CATH AND CORONARY ANGIOGRAPHY N/A 10/09/2018   Procedure: RIGHT/LEFT HEART CATH AND CORONARY ANGIOGRAPHY;  Surgeon: Wellington Hampshire, MD;  Location: [facility];  Service: Cardiovascular;  Laterality: N/A;    Family History  Problem Relation Age of Onset  . Alcoholism Father   . Rheumatic fever Mother   . Diabetes Mother   . Drug abuse Brother      Social History: Married. Wife is a paramedic and they have 2 grown children. He has used to be a paramedic--househusband since 2007. He reports that he has never smoked. He chews tobacco three times a day (a can?)  He reports that he does not drink alcohol or use drugs.    Allergies: No Known Allergies    No medications prior to admission.    Home: Home Living Family/patient expects to be discharged to:: Private residence Living Arrangements: Spouse/significant other, Children Available Help at Discharge: Family, Available PRN/intermittently(family is going to try to be with him 24/ 7 if possible) Type of Home: House Home Access: Stairs to enter [employer] of Steps: 5 Entrance Stairs-Rails: Right Home Layout: Multi-level Alternate Level Stairs-Number of Steps: 5 Alternate Level Stairs-Rails: Right Bathroom Shower/Tub: Chiropodist: Standard Home Equipment: Walker - 2 wheels, Bedside commode  Functional History: Prior Function Level of Independence: Independent Comments: Prior to recent weakness, 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 within last 3-4  weeks everything became a lot harder, SOB, difficulty moving and this last week could not make it up and down his own steps in his house Functional Status:  Mobility: Bed Mobility Overal bed mobility: Needs Assistance Bed  Mobility: Supine to Sit, Sit to Supine Rolling: Mod assist Supine to sit: Min guard, HOB elevated Sit to supine: Min guard General bed mobility comments: Min Guard A for safety and use of bed rail to pull Transfers Overall transfer level: Needs assistance Equipment used: Rolling walker (2 wheeled) Transfers: Sit to/from Stand Sit to Stand: Min assist, From elevated surface General transfer comment: Min A to power up into standing  Ambulation/Gait Ambulation/Gait assistance: Min Web designer (Feet): 30 Feet Assistive device: Rolling walker (2 wheeled) Gait Pattern/deviations: Step-through pattern, Decreased stride length General Gait Details: Slow, mildly unsteady gait but no overt LOB. Sp02 ranged from 89-92% on RA, HR ranged from 100-142 bpm A-fib. 2.4 DOE. Declined any further.  Gait velocity: decreased    ADL: ADL Overall ADL's : Needs assistance/impaired Eating/Feeding: Independent, Bed level Grooming: Supervision/safety, Set up, Sitting, Wash/dry hands Grooming Details (indicate cue type and reason): Pt fatiguing and feeling dizzy after toileting, so returned to EOB and provided hand saniziter Upper Body Bathing: Minimal assistance, Sitting, Bed level Lower Body Bathing: Maximal assistance, Bed level Upper Body Dressing : Minimal assistance, Sitting Lower Body Dressing: Maximal assistance, Bed level Toilet Transfer: Minimal assistance, Ambulation, BSC, Grab bars, RW(BSC over toilet) Toilet Transfer Details (indicate cue type and reason): Min A to power up into standing from [facility]. Toileting- Clothing Manipulation and Hygiene: Minimal assistance, Sit to/from stand Toileting - Clothing Manipulation Details (indicate cue type and reason): Min A for standing balance and managing gown like pt performed peri care after BM at toilet Functional mobility during ADLs: Min guard, Rolling walker General ADL Comments: Pt eager to participate when OT arrived. Pt performing mobility to  toilet and performing toilet hygiene with Min A. Providing pt with education on theraband exercises.   Cognition: Cognition Overall Cognitive Status: Within Functional Limits for tasks assessed Orientation Level: Oriented X4 Cognition Arousal/Alertness: Awake/alert Behavior During Therapy: WFL for tasks assessed/performed(gets irritated easily when pushed) Overall Cognitive Status: Within Functional Limits for tasks assessed General Comments: Does not seem to comprehend the importance of mobility with regards to his health condition; states he want to get better and move however when it comes down to it, always stating excuses re nausea, pain, lack of sleep, food etc    Blood pressure 97/71, pulse 91, temperature 97.7 F (36.5 C), temperature source Oral, resp. rate (!) 23, height 6' 4"  (1.93 m), weight 131.9 kg, SpO2 96 %. Physical Exam  Nursing note and vitals reviewed. Constitutional: He is oriented to person, place, and time. He appears well-developed.  Morbidly obese Anasarca  HENT:  Head: Normocephalic and atraumatic.  Eyes: EOM are normal. Right eye exhibits no discharge. Left eye exhibits no discharge.  Neck: Normal range of motion. Neck supple.  Cardiovascular:  Irregularly irregular  Respiratory: Effort normal and breath sounds normal.  + [place]  GI: Soft. Bowel sounds are normal. He exhibits distension.  Musculoskeletal:        General: Edema present. No tenderness.     Comments: BLE with unna boots and 2+ pedal edema noted.    Neurological: He is alert and oriented to person, place, and time.  Motor: Bilateral upper extremities: 5/5 proximal distal Left lower extremity: 4+/5 proximal distal Right lower extremity: Hip flexion 3/5, knee extension  4/5, ankle dorsiflexion 4/5  Skin:  See above  Psychiatric: He has a normal mood and affect. His behavior is normal.    Results for orders placed or performed during the hospital encounter of 10/01/18 (from the past 24 hour(s))   Heparin level (unfractionated)     Status: Abnormal   Collection Time: 10/14/18  2:54 AM  Result Value Ref Range   Heparin Unfractionated 0.83 (H) 0.30 - 0.70 IU/mL  CBC     Status: Abnormal   Collection Time: 10/14/18  2:54 AM  Result Value Ref Range   WBC 8.8 4.0 - 10.5 K/uL   RBC 4.31 4.22 - 5.81 MIL/uL   Hemoglobin 11.9 (L) 13.0 - 17.0 g/dL   HCT 38.3 (L) 39.0 - 52.0 %   MCV 88.9 80.0 - 100.0 fL   MCH 27.6 26.0 - 34.0 pg   MCHC 31.1 30.0 - 36.0 g/dL   RDW 16.3 (H) 11.5 - 15.5 %   Platelets 274 150 - 400 K/uL   nRBC 0.0 0.0 - 0.2 %  Basic metabolic panel     Status: Abnormal   Collection Time: 10/14/18  2:54 AM  Result Value Ref Range   Sodium 135 135 - 145 mmol/L   Potassium 3.3 (L) 3.5 - 5.1 mmol/L   Chloride 101 98 - 111 mmol/L   CO2 28 22 - 32 mmol/L   Glucose, Bld 98 70 - 99 mg/dL   BUN 14 6 - 20 mg/dL   Creatinine, Ser 0.94 0.61 - 1.24 mg/dL   Calcium 8.1 (L) 8.9 - 10.3 mg/dL   GFR calc non Af Amer >60 >60 mL/min   GFR calc Af Amer >60 >60 mL/min   Anion gap 6 5 - 15  Cooxemetry Panel (carboxy, met, total hgb, O2 sat)     Status: None   Collection Time: 10/14/18  5:16 AM  Result Value Ref Range   Total hemoglobin 12.3 12.0 - 16.0 g/dL   O2 Saturation 64.0 %   Carboxyhemoglobin 1.5 0.5 - 1.5 %   Methemoglobin 1.4 0.0 - 1.5 %   No results found.  Assessment/Plan: Diagnosis: Debility  Labs independently reviewed.  Records reviewed and summated above.  1. Does the need for close, 24 hr/day medical supervision in concert with the patient's rehab needs make it unreasonable for this patient to be served in a less intensive setting? Yes  2. Co-Morbidities requiring supervision/potential complications: Acute systolic CHF (Monitor in accordance with increased physical activity and avoid UE resistance excercises), cholelithiasis, fatty liver, Tachycardia (monitor in accordance with pain and increasing activity), tachypnea (monitor RR and O2 Sats with increased physical  exertion, wean supplemental oxygen when appropriate), Tachycardia (monitor in accordance with pain and increasing activity), hypokalemia (repeat labs, continue to monitor and replete as necessary), CAD/A. fib with RVR (transition from upper GTT to oral medications when appropriate) 3. Due to safety, disease management and patient education, does the patient require 24 hr/day rehab nursing? Yes 4. Does the patient require coordinated care of a physician, rehab nurse, PT (1-2 hrs/day, 5 days/week) and OT (1-2 hrs/day, 5 days/week) to address physical and functional deficits in the context of the above medical diagnosis(es)? Yes Addressing deficits in the following areas: balance, endurance, locomotion, strength, transferring, bathing, dressing, toileting and psychosocial support 5. Can the patient actively participate in an intensive therapy program of at least 3 hrs of therapy per day at least 5 days per week? Potentially 6. The potential for patient to make measurable gains while  on inpatient rehab is excellent 7. Anticipated functional outcomes upon discharge from inpatient rehab are supervision  with PT, supervision with OT, n/a with SLP. 8. Estimated rehab length of stay to reach the above functional goals is: 17-21 days. 9. Anticipated D/C setting: Home 10. Anticipated post D/C treatments: HH therapy and Home excercise program 11. Overall Rehab/Functional Prognosis: good  RECOMMENDATIONS: This patient's condition is appropriate for continued rehabilitative care in the following setting: CIR if/when patient medically stable and able to tolerate 3 hours of therapy a day.  Patient unable to tolerate 3 hours of therapy per day, recommend SNF. Patient has agreed to participate in recommended program. Potentially Note that insurance prior authorization may be required for reimbursement for recommended care.  Comment: Rehab Admissions Coordinator to follow up.   I have personally performed a face to  face diagnostic evaluation, including, but not limited to relevant history and physical exam findings, of this patient and developed relevant assessment and plan.  Additionally, I have reviewed and concur with the physician assistant's documentation above.   Delice Lesch, MD, ABPMR Bary Leriche, PA-C 10/14/2018

## 2018-10-14 NOTE — Progress Notes (Signed)
Patient refused CPAP for the night  

## 2018-10-14 NOTE — Progress Notes (Signed)
Inpatient Rehab Admissions:  Inpatient Rehab Consult received.  I met with patient and his wife, Olin Hauser, at the bedside for rehabilitation assessment and to discuss goals and expectations of an inpatient rehab admission.  They are interested in a CIR stay following discharge from acute care.  Note patient is planned for PCI of LAD/LCx later this week, as well as possible DCCV if patient does not convert on amiodarone.  Will follow along for medical readiness for a possible rehab admit.   Signed: Shann Medal, PT, DPT Admissions Coordinator [phone] 10/14/18  3:33 PM

## 2018-10-14 NOTE — Progress Notes (Signed)
Inpatient Rehabilitation-Admissions Coordinator   [facility] will contact MD to request an IP Rehab Consult Order.  Nanine Means, OTR/L  Rehab Admissions Coordinator  [phone] 10/14/2018 9:42 AM

## 2018-10-14 NOTE — Progress Notes (Signed)
ANTICOAGULATION CONSULT NOTE  Pharmacy Consult for heparin Indication: atrial fibrillation  No Known Allergies  Patient Measurements: Height: 6\' 4"  (193 cm) Weight: 298 lb 4.8 oz (135.3 kg) IBW/kg (Calculated) : 86.8 Heparin Dosing Weight: 117kg  Vital Signs: Temp: 97.7 F (36.5 C) (03/18 0000) Temp Source: Oral (03/18 0000) BP: 96/68 (03/18 0000) Pulse Rate: 94 (03/18 0000)  Labs: Recent Labs    10/12/18 0452 10/12/18 1950 10/13/18 0531 10/14/18 0254  HGB  --   --  12.5* 11.9*  HCT  --   --  39.1 38.3*  PLT  --   --  250 274  HEPARINUNFRC  --  0.28* 0.58 0.83*  CREATININE 0.79  --  0.90  --     Estimated Creatinine Clearance: 140.9 mL/min (by C-G formula based on SCr of 0.9 mg/dL).   Medical History: Past Medical History:  Diagnosis Date  . Morbid obesity (H[facility])     Assessment: 29 YOM with new onset Afib, not on anticoagulation PTA and last dose of lovenox (DVT ppx dosing) on 3/15 @0800 , starting heparin with plans for long-term [facility] after PCI this week.    Heparin level up to supratherapeutic (0.83) on gtt at 1950 uts/hr.  No s/sx of bleeding.Hgb stable, plt wnl.    Goal of Therapy:  Heparin level 0.3-0.7 units/ml Monitor platelets by anticoagulation protocol: Yes   Plan:  Decrease heparin infusion to 1750 units/hr Will f/u 6 hr heparin level  Christoper Fabian, PharmD, BCPS Clinical pharmacist  **Pharmacist phone directory can now be found on [email or web address] ([email or web address]).  Listed under [facility]. 10/14/2018 3:51 AM

## 2018-10-15 ENCOUNTER — Inpatient Hospital Stay (HOSPITAL_COMMUNITY): Payer: Medicaid Other

## 2018-10-15 LAB — BASIC METABOLIC PANEL
Anion gap: 7 (ref 5–15)
BUN: 16 mg/dL (ref 6–20)
CO2: 25 mmol/L (ref 22–32)
Calcium: 7.9 mg/dL — ABNORMAL LOW (ref 8.9–10.3)
Chloride: 99 mmol/L (ref 98–111)
Creatinine, Ser: 0.97 mg/dL (ref 0.61–1.24)
GFR calc Af Amer: >60 mL/min (ref >60)
GFR calc non Af Amer: >60 mL/min (ref >60)
Glucose, Bld: 116 mg/dL — ABNORMAL HIGH (ref 70–99)
Potassium: 4 mmol/L (ref 3.5–5.1)
Sodium: 131 mmol/L — ABNORMAL LOW (ref 135–145)

## 2018-10-15 LAB — CBC WITH DIFFERENTIAL/PLATELET
ABS IMMATURE GRANULOCYTES: 0.1 10*3/uL — AB (ref 0.00–0.07)
Basophils Absolute: 0 10*3/uL (ref 0.0–0.1)
Basophils Relative: 0 %
Eosinophils Absolute: 0.1 10*3/uL (ref 0.0–0.5)
Eosinophils Relative: 0 %
HCT: 31.7 % — ABNORMAL LOW (ref 39.0–52.0)
Hemoglobin: 10.4 g/dL — ABNORMAL LOW (ref 13.0–17.0)
Immature Granulocytes: 1 %
Lymphocytes Relative: 10 %
Lymphs Abs: 1.2 10*3/uL (ref 0.7–4.0)
MCH: 28.8 pg (ref 26.0–34.0)
MCHC: 32.8 g/dL (ref 30.0–36.0)
MCV: 87.8 fL (ref 80.0–100.0)
MONO ABS: 0.7 10*3/uL (ref 0.1–1.0)
Monocytes Relative: 6 %
NEUTROS ABS: 10.1 10*3/uL — AB (ref 1.7–7.7)
Neutrophils Relative %: 83 %
Platelets: 351 10*3/uL (ref 150–400)
RBC: 3.61 MIL/uL — AB (ref 4.22–5.81)
RDW: 16.3 % — ABNORMAL HIGH (ref 11.5–15.5)
WBC: 12.2 10*3/uL — ABNORMAL HIGH (ref 4.0–10.5)
nRBC: 0 % (ref 0.0–0.2)

## 2018-10-15 LAB — TYPE AND SCREEN
ABO/RH(D): A POS
ANTIBODY SCREEN: NEGATIVE

## 2018-10-15 LAB — CBC
HCT: 33 % — ABNORMAL LOW (ref 39.0–52.0)
Hemoglobin: 10.5 g/dL — ABNORMAL LOW (ref 13.0–17.0)
MCH: 28.2 pg (ref 26.0–34.0)
MCHC: 31.8 g/dL (ref 30.0–36.0)
MCV: 88.7 fL (ref 80.0–100.0)
Platelets: 404 10*3/uL — ABNORMAL HIGH (ref 150–400)
RBC: 3.72 MIL/uL — ABNORMAL LOW (ref 4.22–5.81)
RDW: 16.4 % — ABNORMAL HIGH (ref 11.5–15.5)
WBC: 14.1 10*3/uL — AB (ref 4.0–10.5)
nRBC: 0 % (ref 0.0–0.2)

## 2018-10-15 LAB — HEPARIN LEVEL (UNFRACTIONATED): Heparin Unfractionated: 0.59 IU/mL (ref 0.30–0.70)

## 2018-10-15 LAB — ABO/RH: ABO/RH(D): A POS

## 2018-10-15 LAB — MAGNESIUM: Magnesium: 2 mg/dL (ref 1.7–2.4)

## 2018-10-15 MED ORDER — SODIUM CHLORIDE 0.9 % IV BOLUS
500.0000 mL | Freq: Once | INTRAVENOUS | Status: AC
  Administered 2018-10-15: 500 mL via INTRAVENOUS

## 2018-10-15 MED ORDER — CYCLOBENZAPRINE HCL 10 MG PO TABS
5.0000 mg | ORAL_TABLET | Freq: Once | ORAL | Status: AC
  Administered 2018-10-15: 5 mg via ORAL
  Filled 2018-10-15: qty 1

## 2018-10-15 MED ORDER — OXYCODONE HCL 5 MG PO TABS
5.0000 mg | ORAL_TABLET | Freq: Once | ORAL | Status: AC
  Administered 2018-10-15: 5 mg via ORAL
  Filled 2018-10-15: qty 1

## 2018-10-15 NOTE — Progress Notes (Signed)
Pt's BP was 82/64(manual) after 500cc bolus of NaCl. Paged Daphine Deutscher and received orders for another 500cc bolus. Upon starting bolus, pt began to complain of burning and numbness in his right leg and foot and right foot was cool and had slow cap refill, so I removed the compression wrap over right thigh. Will continue to monitor.

## 2018-10-15 NOTE — Progress Notes (Signed)
Per pharmacy, please make sure lab is drawing heparin levels on pt.

## 2018-10-15 NOTE — Progress Notes (Signed)
Patient c/o of burning, cramping, numbness on right upper leg.  MD on call was notify of this problem.  I elevated leg earlier but did not help.  Helped patient to sit up and dangle feet by the bedside, did not help.  Gave pain medication, has not changed.   MD call and added oxycodone 5 mg to see if it relieves it.  If this does not help to draw BMP early to see if it is related to electrolyte levels.  I will keep monitoring patient.

## 2018-10-15 NOTE — Progress Notes (Signed)
PT Cancellation Note  Patient Details Name: Aaron [name] MRN: [id number] DOB: 1966-02-20   Cancelled Treatment:    Reason Eval/Treat Not Completed: Patient at procedure or test/unavailable. To CT to further explore leg pain. Will follow-up for PT treatment as schedule permits.  Ina Homes, PT, DPT Acute Rehabilitation Services  Pager [phone] Office [phone]  Malachy Chamber 10/15/2018, 1:36 PM

## 2018-10-15 NOTE — Progress Notes (Signed)
OT Cancellation Note  Patient Details Name: Bradee Humbard MRN: [id number] DOB: April 05, 1966   Cancelled Treatment:    Reason Eval/Treat Not Completed: Patient at procedure or test/ unavailable(CT. Will return as schedule allows. Thank you.)  Declyn Delsol M Ana Woodroof Viraj Liby MSOT, OTR/L Acute Rehab Pager: [phone] Office: [phone] 10/15/2018, 1:22 PM

## 2018-10-15 NOTE — Progress Notes (Signed)
Inpatient Rehab Admissions Coordinator:    I met with the patient at the bedside to discuss cost of a potential CIR stay.  He reports his wife is working on applying for [employer] for him.  I have also contacted financial counselor, Jenny Reichmann, to request disability/medicaid application possibilities.  I have attempted to contact wife to discuss all of the above, but was unable to reach her.  I will follow up with wife and continue to follow patient's case.    Shann Medal, PT, DPT Admissions Coordinator [phone] 10/15/18  11:43 AM

## 2018-10-15 NOTE — Progress Notes (Addendum)
  CT scan reviewed personally. Large acute intramuscular hematoma in R thigh.   On exam, patient pale with SBPs in 80-90s.   Alert and oriented.  Leg with focal thigh hematoma. Sensation, strength and pulses intact with no evidence of compartment syndrome.   Will give 500 cc NS. Place pressure dressing on thigh. Stat type and screen and CBC ordered.   Heparin off earlier today. Cath for tomorrow cancelled. I stopped ASA, Plavix, spiro and losartan.  Discussed with patient and his RN at bedside.  CCT 35 mins.  Arvilla Meres, MD  6:07 PM

## 2018-10-15 NOTE — Progress Notes (Signed)
ANTICOAGULATION CONSULT NOTE  Pharmacy Consult for heparin Indication: atrial fibrillation  No Known Allergies  Patient Measurements: Height: 6\' 4"  (193 cm) Weight: 294 lb 1.5 oz (133.4 kg) IBW/kg (Calculated) : 86.8 Heparin Dosing Weight: 117kg  Vital Signs: Temp: 97.6 F (36.4 C) (03/19 0500) Temp Source: Oral (03/19 0500) BP: 100/71 (03/19 0500) Pulse Rate: 99 (03/19 0500)  Labs: Recent Labs    10/13/18 0531 10/14/18 0254 10/15/18 0241 10/15/18 0942  HGB 12.5* 11.9*  --  10.4*  HCT 39.1 38.3*  --  31.7*  PLT 250 274  --  351  HEPARINUNFRC 0.58 0.83*  --  0.59  CREATININE 0.90 0.94 0.97  --     Estimated Creatinine Clearance: 129.8 mL/min (by C-G formula based on SCr of 0.97 mg/dL).   Medical History: Past Medical History:  Diagnosis Date  . Morbid obesity (HCC)     Assessment: 37 YOM with new onset Afib, not on anticoagulation PTA and last dose of lovenox (DVT ppx dosing) on 3/15 @0800 , starting heparin with plans for long-term [facility] after PCI this week.    Heparin drip 1750 uts/hr HL 0.59 at goal  Slight drop  in h/h over last 2 days and thigh muscle hard - cramp v thigh hematoma  - hold heparin and check CT of thigh.      Goal of Therapy:  Heparin level 0.3-0.7 units/ml Monitor platelets by anticoagulation protocol: Yes   Plan:  Hold heparin and follow up CT scan   Leota Sauers Pharm.D. CPP, BCPS Clinical Pharmacist [phone] 10/15/2018 12:31 PM

## 2018-10-15 NOTE — Progress Notes (Addendum)
Advanced Heart Failure Rounding Note   Subjective:    [name] pending this am.   He remains in Afib, rate much improved into 70-80s.  Denies SOB or CP. Complaining of R leg cramps and numbness. He is unable to relax his R thigh muscle.    CVP ~4 cm. Cr stable at 0.97.  Seen by TCTS and and felt not to be candidate for CABG currently. Dr Kirke Corin plans for PCI tomorrow.    Objective:   Weight Range:  Vital Signs:   Temp:  [97.6 F (36.4 C)-98.4 F (36.9 C)] 97.6 F (36.4 C) (03/19 0500) Pulse Rate:  [46-99] 99 (03/19 0500) Resp:  [14-23] 22 (03/19 0500) BP: (91-102)/(68-73) 100/71 (03/19 0500) SpO2:  [90 %-96 %] 90 % (03/19 0500) Weight:  [133.4 kg] 133.4 kg (03/19 0500) Last BM Date: 10/13/18  Weight change: Filed Weights   10/13/18 0626 10/14/18 0450 10/15/18 0500  Weight: 135.3 kg 131.9 kg 133.4 kg   Intake/Output:   Intake/Output Summary (Last 24 hours) at 10/15/2018 0739 Last data filed at 10/14/2018 2244 Gross per 24 hour  Intake 730 ml  Output 750 ml  Net -20 ml    Physical Exam   General: NAD  HEENT: Normal Neck: Supple. JVP not elevated Carotids 2+ bilat; no bruits. No thyromegaly or nodule noted. Cor: PMI nondisplaced. RRR, No M/G/R noted Lungs: CTAB, normal effort. Abdomen: Soft, non-tender, non-distended, no HSM. No bruits or masses. +BS  Extremities: No cyanosis, clubbing, or rash. + UNNA boots. + PICC. R vastus intermedius is tight and he is unable to relax, noticeable different from L thigh.  Neuro: Alert & orientedx3, cranial nerves grossly intact. moves all 4 extremities w/o difficulty. Affect pleasant   Telemetry   Afib 90s, personally reviewed.   Labs    Basic Metabolic Panel: Recent Labs  Lab 10/11/18 0322 10/12/18 0452 10/13/18 0531 10/14/18 0254 10/15/18 0241  NA 136 133* 134* 135 131*  K 3.8 3.9 3.9 3.3* 4.0  CL 101 103 101 101 99  CO2 27 25 23 28 25   GLUCOSE 97 114* 97 98 116*  BUN 13 12 16 14 16   CREATININE 0.79 0.79  0.90 0.94 0.97  CALCIUM 8.2* 7.6* 8.2* 8.1* 7.9*  MG  --  2.0  --   --   --    Liver Function Tests: No results for input(s): AST, ALT, ALKPHOS, BILITOT, PROT, ALBUMIN in the last 168 hours. No results for input(s): LIPASE, AMYLASE in the last 168 hours. No results for input(s): AMMONIA in the last 168 hours.  CBC: Recent Labs  Lab 10/09/18 1051 10/09/18 1202 10/10/18 0506 10/13/18 0531 10/14/18 0254  WBC  --  9.7 7.8 10.2 8.8  NEUTROABS  --   --  6.2  --   --   HGB 13.6 13.4 11.7* 12.5* 11.9*  HCT 40.0 42.4 37.1* 39.1 38.3*  MCV  --  92.4 91.6 90.5 88.9  PLT  --  182 164 250 274   Cardiac Enzymes: No results for input(s): CKTOTAL, CKMB, CKMBINDEX, TROPONINI in the last 168 hours.  BNP: BNP (last 3 results) Recent Labs    10/01/18 1337 10/01/18 1720  BNP 2,769.2* 2,333.1*   ProBNP (last 3 results) No results for input(s): PROBNP in the last 8760 hours.  Other results:  Imaging: No results found.  Medications:    Scheduled Medications: . amiodarone  400 mg Oral BID  . aspirin  81 mg Oral Daily  . atorvastatin  80  mg Oral q1800  . Chlorhexidine Gluconate Cloth  6 each Topical Daily  . clopidogrel  75 mg Oral Daily  . digoxin  0.125 mg Oral Daily  . hydrocortisone  25 mg Rectal BID  . losartan  12.5 mg Oral BID  . mouth rinse  15 mL Mouth Rinse BID  . nicotine  21 mg Transdermal Daily  . potassium chloride  40 mEq Oral Daily  . sodium chloride flush  10-40 mL Intracatheter Q12H  . sodium chloride flush  3 mL Intravenous Q12H  . spironolactone  12.5 mg Oral QHS    Infusions: . sodium chloride    . heparin 1,750 Units/hr (10/14/18 1029)    PRN Medications: sodium chloride, acetaminophen, alum & mag hydroxide-simeth, ondansetron (ZOFRAN) IV, sodium chloride flush, sodium chloride flush, traMADol, witch hazel-glycerin  Assessment:   Aaron [name] is a 53 y.o. male with h/o of obesity who presented with c/o fluid retention, abdominal swelling, and  peripheral edema x 3-4 weeks.  Echo showed new systolic CHF 15%.    CHF team consulted for follow up and treatment. Cath results pending at time of consultation.   Plan/Discussion:    1. Acute systolic CHF -> cardiogenic shock due to iCM - Echo 10/02/18 LVEF 15%, Normal RV, Mod LAE, Mod MR, Mild AI, Mild PI.  - Cath 3/13 showed severe 3v CAD with markedly decompensated hemodynamics - [name] pending.  - CVP ~4  - Continue to hold torsemide today for cath tomorrow.  - Continue spiro 12.5 mg daily - Continue digoxin 0.125 mg daily.  - Continue losartan 12.5 mg BID.  - No b-blocker yet with shock - Continue UNNA boots  2. CAD - Severe 3v CAD as above. Has been seen by Dr. Dorris Fetch (TCTS). Not CABG candidate currently given hemodynamics and lack of mobility.  - No s/s of ischemia.    - As the RCA has good collaterals and LAD is focal mid lesion, PCI of the LAD and LCx may be best option later this week per Dr. Kirke Corin.  - Continue ASA and statin. No b-blocker yet with shock  3. Afib with RVR, new - Off milrinone and on IV amiodarone.  Rate controlled.  - This patients CHA2DS2-VASc Score is at least 3.  - He is on heparin for now with plans for PCI tomorrow.   - Will plan on Eliquis 5 mg BID post procedure. If does not convert on amiodarone, will need DCCV.   4. AKI - Cr 1.44 on admit.  - Cr 0.97 today. Holding torsemide to give room for cath.   5. Elevated LFTs/shock liver - AST 110 and ALT 268 with total Bili 6.2 on admission. Trended down as of 3/12. No further.  - Likely passive congestion/low output CHF.   6. Morbid obesity - Body mass index is 35.8 kg/m.  - Will need sleep study as outpatient and encouraged weight loss.   7. Hypokalemia - K improved to 4.0  8. Severe deconditioning due to obesity - PT and CIR following. Encouraged participation and mobilization.   9 Severe muscle cramps - Check Mg. K stable.  - Heating pads - Will discuss use of muscle relaxer  with MD.   Length of Stay: [address] [place], [place]  10/15/2018, 7:39 AM  Advanced Heart Failure Team Pager [phone] (M-F; 7a - 4p)  Please contact [facility] for night-coverage after hours (4p -7a ) and weekends on [email or web address]  Patient seen and examined with the above-signed Advanced Practice  Provider and/or Housestaff. I personally reviewed laboratory data, imaging studies and relevant notes. I independently examined the patient and formulated the important aspects of the plan. I have edited the note to reflect any of my changes or salient points. I have personally discussed the plan with the patient and/or family.  Remains in AF but rate controlled on IV amio. Has been on heparin but hgb down slightly and now with pain and limited ROM in R quadricep. Concern for possible thigh hematoma. Heparin held. Stat CT pending.   Denies CP or SOB. Volume status looks good. Off milrinone. Plan PCI of LAD and LCX tomorrow if no thigh hematoma.   Arvilla Meres, MD  12:19 PM

## 2018-10-16 ENCOUNTER — Inpatient Hospital Stay (HOSPITAL_COMMUNITY): Payer: Medicaid Other

## 2018-10-16 ENCOUNTER — Encounter (HOSPITAL_COMMUNITY)
Admission: EM | Disposition: A | Discharge status: Rehabilitation Facility | Payer: Self-pay | Source: Home / Self Care | Attending: Internal Medicine

## 2018-10-16 LAB — OCCULT BLOOD GASTRIC / DUODENUM (SPECIMEN CUP): Occult Blood, Gastric: POSITIVE — AB

## 2018-10-16 LAB — CBC
HCT: 30.3 % — ABNORMAL LOW (ref 39.0–52.0)
HCT: 28.1 % — ABNORMAL LOW (ref 39.0–52.0)
Hemoglobin: 9.2 g/dL — ABNORMAL LOW (ref 13.0–17.0)
Hemoglobin: 9.1 g/dL — ABNORMAL LOW (ref 13.0–17.0)
MCH: 28 pg (ref 26.0–34.0)
MCH: 29.1 pg (ref 26.0–34.0)
MCHC: 30.4 g/dL (ref 30.0–36.0)
MCHC: 32.4 g/dL (ref 30.0–36.0)
MCV: 92.4 fL (ref 80.0–100.0)
MCV: 89.8 fL (ref 80.0–100.0)
NRBC: 0 % (ref 0.0–0.2)
Platelets: 409 10*3/uL — ABNORMAL HIGH (ref 150–400)
Platelets: 362 10*3/uL (ref 150–400)
RBC: 3.28 MIL/uL — ABNORMAL LOW (ref 4.22–5.81)
RBC: 3.13 MIL/uL — ABNORMAL LOW (ref 4.22–5.81)
RDW: 16.5 % — ABNORMAL HIGH (ref 11.5–15.5)
RDW: 16.8 % — ABNORMAL HIGH (ref 11.5–15.5)
WBC: 14.2 10*3/uL — ABNORMAL HIGH (ref 4.0–10.5)
WBC: 7.5 10*3/uL (ref 4.0–10.5)
nRBC: 0 % (ref 0.0–0.2)

## 2018-10-16 LAB — BASIC METABOLIC PANEL
ANION GAP: 12 (ref 5–15)
BUN: 25 mg/dL — ABNORMAL HIGH (ref 6–20)
CO2: 23 mmol/L (ref 22–32)
Calcium: 8.1 mg/dL — ABNORMAL LOW (ref 8.9–10.3)
Chloride: 99 mmol/L (ref 98–111)
Creatinine, Ser: 1.45 mg/dL — ABNORMAL HIGH (ref 0.61–1.24)
GFR calc Af Amer: >60 mL/min (ref >60)
GFR calc non Af Amer: 54 mL/min — ABNORMAL LOW (ref >60)
GLUCOSE: 108 mg/dL — AB (ref 70–99)
POTASSIUM: 4.6 mmol/L (ref 3.5–5.1)
Sodium: 134 mmol/L — ABNORMAL LOW (ref 135–145)

## 2018-10-16 LAB — HEPARIN LEVEL (UNFRACTIONATED): Heparin Unfractionated: <0.1 IU/mL — ABNORMAL LOW (ref 0.30–0.70)

## 2018-10-16 LAB — COOXEMETRY PANEL
Carboxyhemoglobin: 1.9 % — ABNORMAL HIGH (ref 0.5–1.5)
Methemoglobin: 1 % (ref 0.0–1.5)
O2 Saturation: 54.1 %
Total hemoglobin: 10.7 g/dL — ABNORMAL LOW (ref 12.0–16.0)

## 2018-10-16 SURGERY — CORONARY STENT INTERVENTION
Anesthesia: LOCAL

## 2018-10-16 MED ORDER — AMIODARONE HCL IN DEXTROSE 360-4.14 MG/200ML-% IV SOLN
30.0000 mg/h | INTRAVENOUS | Status: DC
  Administered 2018-10-16 – 2018-10-29 (×27): 30 mg/h via INTRAVENOUS
  Filled 2018-10-16 (×31): qty 200

## 2018-10-16 MED ORDER — DIGOXIN 0.25 MG/ML IJ SOLN
0.1250 mg | Freq: Every day | INTRAMUSCULAR | Status: DC
  Administered 2018-10-16 – 2018-10-22 (×7): 0.125 mg via INTRAVENOUS
  Filled 2018-10-16 (×7): qty 2

## 2018-10-16 MED ORDER — PANTOPRAZOLE SODIUM 40 MG IV SOLR
40.0000 mg | Freq: Two times a day (BID) | INTRAVENOUS | Status: DC
  Administered 2018-10-16 – 2018-10-31 (×31): 40 mg via INTRAVENOUS
  Filled 2018-10-16 (×31): qty 40

## 2018-10-16 NOTE — Progress Notes (Signed)
PT Cancellation Note  Patient Details Name: Aaron [name] MRN: [id number] DOB: 1966/04/18   Cancelled Treatment:    Reason Eval/Treat Not Completed: Medical issues which prohibited therapy. Pt with nausea/vomiting; per RN, plan for NGT placement. Will follow-up for PT treatment.  Ina Homes, PT, DPT Acute Rehabilitation Services  Pager [phone] Office [phone]  Malachy Chamber 10/16/2018, 10:04 AM

## 2018-10-16 NOTE — Progress Notes (Addendum)
Pt vomited-brown, coffee colored.  Paged K. Schorr for gastric occult orders. Gastric occult positive & notified K. Schorr

## 2018-10-16 NOTE — Consult Note (Signed)
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  He was found to have small pleural effusions, minimal ascites,acute systolic congestive heart failure, cardiogenic shock due to ischemic cardiomyopathy, underwent cardiac catheterization which showed EF of 20%, severe three-vessel coronary artery disease, he was evaluated and felt not to be a candidate for CABG with plans for aggressive medical therapy.  Meanwhile he developed A. fib with RVR, was started on heparin with plan to start Eliquis after PCI. He was found to have a large acute intramuscular hematoma in his right thigh, was hypotensive, Heparin was held, PCI was deferred. Yesterday evening he developed nausea and vomiting, at least 3-4 such episodes, had an abdominal x-ray which showed probably distended loops of small and large bowel, stool noted in the colon including rectum, findings most consistent with adynamic ileus.  Small or large bowel obstruction cannot be excluded.  He had an NG tube placed was noted to have persistent bowel dilatation, canister connected to NG tube has about 250 cc of coffee-ground fluid in it. Patient states his last bowel movement was 2 days ago, denies noticing melena or hematochezia. He states nausea has improved after NG tube placement, and the abdomen feels less distended, he has not passed flatus.  He has mild heartburn. No prior EGD or  colonoscopy. No family history of colon cancer.  Past Medical History:  Diagnosis Date  . Morbid obesity (HCC)     Past Surgical History:  Procedure Laterality Date  . RIGHT/LEFT HEART CATH AND CORONARY ANGIOGRAPHY N/A 10/09/2018   Procedure: RIGHT/LEFT HEART CATH AND CORONARY ANGIOGRAPHY;  Surgeon: Iran OuchArida, Muhammad A, MD;  Location: [facility] INVASIVE CV [facility];  Service: Cardiovascular;  Laterality: N/A;    Prior to Admission medications   Not on File    Current Facility-Administered Medications  Medication Dose Route Frequency Provider Last Rate Last Dose  . 0.9 %  sodium chloride infusion  250 mL Intravenous PRN Bensimhon, Bevelyn Bucklesaniel R, MD      . acetaminophen (TYLENOL) tablet 650 mg  650 mg Oral Q4H PRN Bensimhon, Bevelyn Bucklesaniel R, MD      . alum & mag hydroxide-simeth (MAALOX/MYLANTA) 200-200-20 MG/5ML suspension 30 mL  30 mL Oral Q4H PRN Alwyn RenMathews, Elizabeth G, MD   30 mL at 10/16/18 0552  . amiodarone (NEXTERONE PREMIX) 360-4.14 MG/200ML-% (1.8 mg/mL) IV infusion  30 mg/hr Intravenous Continuous Graciella Freerillery, Michael Andrew, PA-C 16.67 mL/hr at 10/16/18 1300 30 mg/hr at 10/16/18 1300  . Chlorhexidine Gluconate Cloth 2 % PADS 6 each  6 each Topical Daily Bensimhon, Bevelyn Bucklesaniel R, MD   6 each at 10/14/18 1033  . digoxin (LANOXIN) 0.25 MG/ML injection 0.125 mg  0.125 mg Intravenous Daily Bensimhon, Bevelyn Bucklesaniel R, MD   0.125 mg at 10/16/18 1043  . hydrocortisone (ANUSOL-HC) suppository 25 mg  25 mg Rectal BID Alwyn RenMathews, Elizabeth G, MD   25 mg at 10/13/18 1133  . MEDLINE mouth rinse  15 mL Mouth Rinse BID Bensimhon, Bevelyn Bucklesaniel R, MD   15 mL at 10/16/18 1050  . nicotine (NICODERM CQ -  dosed in mg/24 hours) patch 21 mg  21 mg Transdermal Daily Bensimhon, Bevelyn Buckles, MD   21 mg at 10/16/18 1042  . ondansetron (ZOFRAN) injection 4 mg  4 mg Intravenous Q6H PRN Bensimhon, Bevelyn Buckles, MD   4 mg at 10/16/18 0552  . sodium chloride flush (NS) 0.9 % injection 10-40 mL  10-40 mL Intracatheter Q12H Bensimhon, Bevelyn Buckles, MD   10 mL at 10/16/18  1049  . sodium chloride flush (NS) 0.9 % injection 10-40 mL  10-40 mL Intracatheter PRN Bensimhon, Bevelyn Buckles, MD      . sodium chloride flush (NS) 0.9 % injection 3 mL  3 mL Intravenous Q12H Bensimhon, Bevelyn Buckles, MD   3 mL at 10/11/18 1040  . sodium chloride flush (NS) 0.9 % injection 3 mL  3 mL Intravenous PRN Bensimhon, Bevelyn Buckles, MD      . witch hazel-glycerin (TUCKS) pad   Topical PRN Bensimhon, Bevelyn Buckles, MD        Allergies as of 10/01/2018  . (No Known Allergies)    Family History  Problem Relation Age of Onset  . Alcoholism Father   . Rheumatic fever Mother   . Diabetes Mother   . Drug abuse Brother     Social History   Socioeconomic History  . Marital status: Married    Spouse name: Not on file  . Number of children: Not on file  . Years of education: Not on file  . Highest education level: Not on file  Occupational History  . Not on file  Social Needs  . Financial resource strain: Not on file  . Food insecurity:    Worry: Not on file    Inability: Not on file  . Transportation needs:    Medical: Not on file    Non-medical: Not on file  Tobacco Use  . Smoking status: Never Smoker  . Smokeless tobacco: Never Used  Substance and Sexual Activity  . Alcohol use: Never    Frequency: Never  . Drug use: Never  . Sexual activity: Not on file  Lifestyle  . Physical activity:    Days per week: Not on file    Minutes per session: Not on file  . Stress: Not on file  Relationships  . Social connections:    Talks on phone: Not on file    Gets together: Not on file    Attends religious service: Not on file    Active member of club or organization: Not on file    Attends meetings of clubs or organizations: Not on file    Relationship status: Not on file  . Intimate partner violence:    Fear of current or ex partner: Not on file    Emotionally abused: Not on file    Physically abused: Not on file    Forced sexual activity: Not on file  Other Topics Concern  . Not  on file  Social History Narrative  . Not on file    Review of Systems: Positive for: GI: Described in detail in HPI.    Gen: Denies any fever, chills, rigors, night sweats, anorexia, fatigue, weakness, malaise, involuntary weight loss, and sleep disorder CV: Denies chest pain, angina, palpitations, syncope, orthopnea, PND, peripheral edema, and claudication. Resp: dyspnea,Denies  cough, sputum, wheezing, coughing up blood. GU : Denies urinary burning, blood in urine, urinary frequency, urinary hesitancy, nocturnal urination, and urinary incontinence. MS: Leg swelling, Denies joint pain.  Denies muscle weakness, cramps, atrophy.  Derm: Denies  rash, itching, oral ulcerations, hives, unhealing ulcers.  Psych: Denies depression, anxiety, memory loss, suicidal ideation, hallucinations,  and confusion. Heme: Denies bruising, and enlarged lymph nodes. Neuro:  Denies any headaches, dizziness, paresthesias. Endo:  Denies any problems with DM, thyroid, adrenal function.  Physical Exam: Vital signs in last 24 hours: Temp:  [97.4 F (36.3 C)-99.1 F (37.3 C)] 97.4 F (36.3 C) (03/20 1244) Pulse Rate:  [80-120] 103 (03/20 1244) Resp:  [20-25] 22 (03/20 1244) BP: (79-103)/(58-70) 93/70 (03/20 1244) SpO2:  [95 %-99 %] 95 % (03/20 1244) Weight:  [134.4 kg] 134.4 kg (03/20 0533) Last BM Date: 10/14/18  General:   Alert,  Well-developed, obese, pleasant and cooperative in NAD Head:  Normocephalic and atraumatic.  NG tube draining coffee-ground material Eyes:  Sclera clear, no icterus.   Mild pallor Ears:  Normal auditory acuity. Nose:  No deformity, discharge,  or lesions. Mouth:  No deformity or lesions.  Oropharynx pink & moist. Neck:  Supple; no masses or thyromegaly. Lungs:  Clear throughout to auscultation.   No wheezes, crackles, or rhonchi. No acute distress. Heart: Irregular rate; no murmurs, clicks, rubs,  or gallops. Extremities: Lower extremities covered in compression stockings.   Large right thigh hematoma-tender Neurologic:  Alert and  oriented x4;  grossly normal neurologically. Skin:  Intact without significant lesions or rashes. Psych:  Alert and cooperative. Normal mood and affect. Abdomen:  Soft, nontender and distended. No masses, hepatosplenomegaly or hernias noted. Normal bowel sounds, without guarding, and without rebound.         Lab Results: Recent Labs    10/15/18 0942 10/15/18 1808 10/16/18 0426  WBC 12.2* 14.1* 14.2*  HGB 10.4* 10.5* 9.2*  HCT 31.7* 33.0* 30.3*  PLT 351 404* 409*   BMET Recent Labs    10/14/18 0254 10/15/18 0241 10/16/18 0426  NA 135 131* 134*  K 3.3* 4.0 4.6  CL 101 99 99  CO2 28 25 23   GLUCOSE 98 116* 108*  BUN 14 16 25*  CREATININE 0.94 0.97 1.45*  CALCIUM 8.1* 7.9* 8.1*   LFT No results for input(s): PROT, ALBUMIN, AST, ALT, ALKPHOS, BILITOT, BILIDIR, IBILI in the last 72 hours. PT/INR No results for input(s): LABPROT, INR in the last 72 hours.  Studies/Results: Dg Abd 1 View  Result Date: 10/16/2018 CLINICAL DATA:  Nasogastric tube placement EXAM: ABDOMEN - 1 VIEW COMPARISON:  October 16, 2018 abdominal radiographs obtained earlier in the day FINDINGS: Nasogastric tube tip is at the gastroesophageal junction. There remains dilated bowel. No free air evident. IMPRESSION: Nasogastric tube tip at gastroesophageal junction. Advise advancing nasogastric tube approximately 8 cm to insure that tip and side port are well within the stomach. Persistent bowel dilatation noted. These results will be called to the ordering clinician or representative by the Radiology Department at the imaging location. Electronically Signed   By: Bretta Bang III M.D.   On: 10/16/2018 13:34   Dg Abd 1 View  Result Date: 10/16/2018 CLINICAL DATA:  Hematemesis.  Distention. EXAM: ABDOMEN - 1 VIEW COMPARISON:  10/10/2018. FINDINGS: Prominently distended loops of small and large bowel are noted. Stool noted in the colon including the  rectum. Findings most consistent adynamic ileus. Small or large bowel obstruction can not be excluded. Close follow-up exams suggested to demonstrate resolution. Pelvic calcifications consistent phleboliths. Degenerative change lumbar spine. IMPRESSION: Probably distended loops of small and large bowel noted. Stool noted in the colon including the rectum. Findings most consistent with adynamic ileus. Small or large  bowel obstruction can not be excluded. Close follow-up exam suggested demonstrate resolution. Electronically Signed   By: Maisie Fus  Register   On: 10/16/2018 08:58   Ct Tibia Fibula Right Wo Contrast  Result Date: 10/15/2018 CLINICAL DATA:  Burning, cramping, numbness on right upper leg. EXAM: CT OF THE RIGHT FEMUR WITHOUT CONTRAST CT OF THE RIGHT TIBIA AND FIBULA WITHOUT CONTRAST CT OF THE RIGHT FOOT WITHOUT CONTRAST TECHNIQUE: Multidetector CT imaging of the right lower extremity was performed according to the standard protocol. COMPARISON:  None. FINDINGS: Bones/Joint/Cartilage FEMUR: No fracture or dislocation. Normal alignment. No joint effusion. No aggressive osseous lesion. TIBIA AND FIBULA: No fracture or dislocation. Normal alignment. No joint effusion. No aggressive osseous lesion. FOOT: No fracture or dislocation. Normal alignment. No joint effusion. No aggressive osseous lesion. Ligaments Ligaments are suboptimally evaluated by CT. Muscles and Tendons Expansion of the right rectus femoris muscle with an intramuscular hematoma measuring 6.7 x 5.7 x 2.5 cm. Remainder the muscles are normal. No other intramuscular fluid collection or hematoma. No muscle atrophy. Quadriceps tendon and patellar tendon are intact. Flexor, extensor, peroneal and Achilles tendons are intact. Soft tissue No fluid collection or hematoma. No soft tissue mass. Generalized soft tissue edema around the right foot likely reactive. Mild soft tissue edema around the right knee. IMPRESSION: 1. Large intramuscular hematoma in  the right rectus femoris muscle. Electronically Signed   By: Elige Ko   On: 10/15/2018 16:57   Ct Foot Right Wo Contrast  Result Date: 10/15/2018 CLINICAL DATA:  Burning, cramping, numbness on right upper leg. EXAM: CT OF THE RIGHT FEMUR WITHOUT CONTRAST CT OF THE RIGHT TIBIA AND FIBULA WITHOUT CONTRAST CT OF THE RIGHT FOOT WITHOUT CONTRAST TECHNIQUE: Multidetector CT imaging of the right lower extremity was performed according to the standard protocol. COMPARISON:  None. FINDINGS: Bones/Joint/Cartilage FEMUR: No fracture or dislocation. Normal alignment. No joint effusion. No aggressive osseous lesion. TIBIA AND FIBULA: No fracture or dislocation. Normal alignment. No joint effusion. No aggressive osseous lesion. FOOT: No fracture or dislocation. Normal alignment. No joint effusion. No aggressive osseous lesion. Ligaments Ligaments are suboptimally evaluated by CT. Muscles and Tendons Expansion of the right rectus femoris muscle with an intramuscular hematoma measuring 6.7 x 5.7 x 2.5 cm. Remainder the muscles are normal. No other intramuscular fluid collection or hematoma. No muscle atrophy. Quadriceps tendon and patellar tendon are intact. Flexor, extensor, peroneal and Achilles tendons are intact. Soft tissue No fluid collection or hematoma. No soft tissue mass. Generalized soft tissue edema around the right foot likely reactive. Mild soft tissue edema around the right knee. IMPRESSION: 1. Large intramuscular hematoma in the right rectus femoris muscle. Electronically Signed   By: Elige Ko   On: 10/15/2018 16:57   Ct Extremity Lower Right Wo Contrast  Result Date: 10/15/2018 CLINICAL DATA:  Burning, cramping, numbness on right upper leg. EXAM: CT OF THE RIGHT FEMUR WITHOUT CONTRAST CT OF THE RIGHT TIBIA AND FIBULA WITHOUT CONTRAST CT OF THE RIGHT FOOT WITHOUT CONTRAST TECHNIQUE: Multidetector CT imaging of the right lower extremity was performed according to the standard protocol. COMPARISON:   None. FINDINGS: Bones/Joint/Cartilage FEMUR: No fracture or dislocation. Normal alignment. No joint effusion. No aggressive osseous lesion. TIBIA AND FIBULA: No fracture or dislocation. Normal alignment. No joint effusion. No aggressive osseous lesion. FOOT: No fracture or dislocation. Normal alignment. No joint effusion. No aggressive osseous lesion. Ligaments Ligaments are suboptimally evaluated by CT. Muscles and Tendons Expansion of the right rectus femoris muscle with an  intramuscular hematoma measuring 6.7 x 5.7 x 2.5 cm. Remainder the muscles are normal. No other intramuscular fluid collection or hematoma. No muscle atrophy. Quadriceps tendon and patellar tendon are intact. Flexor, extensor, peroneal and Achilles tendons are intact. Soft tissue No fluid collection or hematoma. No soft tissue mass. Generalized soft tissue edema around the right foot likely reactive. Mild soft tissue edema around the right knee. IMPRESSION: 1. Large intramuscular hematoma in the right rectus femoris muscle. Electronically Signed   By: Elige Ko   On: 10/15/2018 16:57    Impression: Coffee-ground emesis compatible with upper GI bleeding, elevated BUN/creatinine ratio 25/1.45, Hb 9.2(was 10.4/10.5 previously) Could be related to stress gastritis/ulcer disease/Mallory Weiss tear  Small and large bowel obstruction noted on abdominal x-ray, possible adynamic ileus CT abdomen and pelvis without contrast from 10/01/2018 showed normal liver and gallbladder, pancreas, normal spleen, elongated sigmoid loop, normal stomach and small bowel otherwise  Right lower lobe infiltrate consistent with pneumonia  Large intramuscular hematoma in right rectus femoris muscle 6.7 x 5.7 x 2.5 cm  Elevated liver enzymes(T bili 5.2/AST 81/ALT 219/ALP 144 on 10/02/2018, trended down to 2.4/62/82/124 on 10/08/2018), could be related to congestive hepatopathy versus mild ischemia No signs of encephalopathy or coagulopathy Ultrasound Doppler  showed overall diminished flow in portal vein, normal direction, patent hepatic vein Ultrasound from 10/01/2018 showed cholelithiasis without acute cholecystitis, no bile duct dilatation, fatty liver   Plan: Keep patient n.p.o., continue NG tube placement to low intermittent suction, daily abdominal x-ray to see if there is any improvement in ileus/small and large bowel dilatation, keep potassium above 4.5 and replace magnesium if needed. Protonix 40 mg IV twice daily, added to H&H and transfuse if hemoglobin less than 7. Patient may need an EGD, if there is resolution of small bowel/large bowel dilatation/ileus and Hb continues to drop   LOS: 15 days   Kerin Salen, MD  10/16/2018, 1:52 PM  Pager [phone] If no answer or after 5 PM call [phone]

## 2018-10-16 NOTE — Progress Notes (Signed)
Pt unable to wear CPAP d/t NG tube in place. RT will continue to monitor.

## 2018-10-16 NOTE — Progress Notes (Signed)
Advanced nasogastric tube approximately 8 cm as recommended by xray results.   Sheppard Evens RN

## 2018-10-16 NOTE — Progress Notes (Addendum)
Advanced Heart Failure Rounding Note   Subjective:    [name] 54.1% this am. CBC pending. BMET pending.   CT 10/15/18 with acute R thigh hematoma.  Pt also had dark-brown emesis overnight, with positive gastric occult blood.   He remains in Afib, rate much improved into 70-80s.  Leg is feeling somewhat better today, with more ROM and less numbness/tingling. He had large black emesis last night, and has continued to have in smaller intervals. Remains nauseated. Denies SOB or CP.   Seen by TCTS and and felt not to be candidate for CABG currently. Dr Kirke Corin had planned for PCI, but deferred with thigh hematoma.   Objective:   Weight Range:  Vital Signs:   Temp:  [97.4 F (36.3 C)-99.1 F (37.3 C)] 99 F (37.2 C) (03/20 0533) Pulse Rate:  [48-120] 105 (03/20 0533) Resp:  [13-25] 25 (03/20 0533) BP: (79-103)/(58-76) 101/59 (03/20 0533) SpO2:  [95 %-99 %] 98 % (03/20 0533) Weight:  [134.4 kg] 134.4 kg (03/20 0533) Last BM Date: 10/14/18  Weight change: Filed Weights   10/14/18 0450 10/15/18 0500 10/16/18 0533  Weight: 131.9 kg 133.4 kg 134.4 kg   Intake/Output:   Intake/Output Summary (Last 24 hours) at 10/16/2018 0734 Last data filed at 10/16/2018 0700 Gross per 24 hour  Intake 880 ml  Output 250 ml  Net 630 ml    Physical Exam   General: NAD. + emesis basin with dark vomitus. HEENT: Normal Neck: Supple. JVP not elevated. Carotids 2+ bilat; no bruits. No thyromegaly or nodule noted. Cor: PMI nondisplaced. Irregular, tachy.  Lungs: CTAB, normal effort. Abdomen: Obese, non-tender. ? Mildly distended. no HSM. No bruits or masses. +BS  Extremities: No cyanosis, clubbing, or rash. + UNNA boots.  Area of hematoma on anterior thigh, no dependent edema. Non-tender currently.  Neuro: Alert & orientedx3, cranial nerves grossly intact. moves all 4 extremities w/o difficulty. Affect pleasant   Telemetry   Afib 110-120s, personally reviewed.    Labs    Basic Metabolic  Panel: Recent Labs  Lab 10/11/18 0322 10/12/18 0452 10/13/18 0531 10/14/18 0254 10/15/18 0241 10/15/18 0942  NA 136 133* 134* 135 131*  --   K 3.8 3.9 3.9 3.3* 4.0  --   CL 101 103 101 101 99  --   CO2 --   GLUCOSE 97 114* 97 98 116*  --   BUN --   CREATININE 0.79 0.79 0.90 0.94 0.97  --   CALCIUM 8.2* 7.6* 8.2* 8.1* 7.9*  --   MG  --  2.0  --   --   --  2.0   Liver Function Tests: No results for input(s): AST, ALT, ALKPHOS, BILITOT, PROT, ALBUMIN in the last 168 hours. No results for input(s): LIPASE, AMYLASE in the last 168 hours. No results for input(s): AMMONIA in the last 168 hours.  CBC: Recent Labs  Lab 10/10/18 0506 10/13/18 0531 10/14/18 0254 10/15/18 0942 10/15/18 1808  WBC 7.8 10.2 8.8 12.2* 14.1*  NEUTROABS 6.2  --   --  10.1*  --   HGB 11.7* 12.5* 11.9* 10.4* 10.5*  HCT 37.1* 39.1 38.3* 31.7* 33.0*  MCV 91.6 90.5 88.9 87.8 88.7  PLT 164 250 274 351 404*   Cardiac Enzymes: No results for input(s): CKTOTAL, CKMB, CKMBINDEX, TROPONINI in the last 168 hours.  BNP: BNP (last 3 results) Recent Labs    10/01/18 1337 10/01/18 1720  BNP  2,769.2* 2,333.1*   ProBNP (last 3 results) No results for input(s): PROBNP in the last 8760 hours.  Other results:  Imaging: Ct Tibia Fibula Right Wo Contrast  Result Date: 10/15/2018 CLINICAL DATA:  Burning, cramping, numbness on right upper leg. EXAM: CT OF THE RIGHT FEMUR WITHOUT CONTRAST CT OF THE RIGHT TIBIA AND FIBULA WITHOUT CONTRAST CT OF THE RIGHT FOOT WITHOUT CONTRAST TECHNIQUE: Multidetector CT imaging of the right lower extremity was performed according to the standard protocol. COMPARISON:  None. FINDINGS: Bones/Joint/Cartilage FEMUR: No fracture or dislocation. Normal alignment. No joint effusion. No aggressive osseous lesion. TIBIA AND FIBULA: No fracture or dislocation. Normal alignment. No joint effusion. No aggressive osseous lesion. FOOT: No fracture or dislocation.  Normal alignment. No joint effusion. No aggressive osseous lesion. Ligaments Ligaments are suboptimally evaluated by CT. Muscles and Tendons Expansion of the right rectus femoris muscle with an intramuscular hematoma measuring 6.7 x 5.7 x 2.5 cm. Remainder the muscles are normal. No other intramuscular fluid collection or hematoma. No muscle atrophy. Quadriceps tendon and patellar tendon are intact. Flexor, extensor, peroneal and Achilles tendons are intact. Soft tissue No fluid collection or hematoma. No soft tissue mass. Generalized soft tissue edema around the right foot likely reactive. Mild soft tissue edema around the right knee. IMPRESSION: 1. Large intramuscular hematoma in the right rectus femoris muscle. Electronically Signed   By: Elige Ko   On: 10/15/2018 16:57   Ct Foot Right Wo Contrast  Result Date: 10/15/2018 CLINICAL DATA:  Burning, cramping, numbness on right upper leg. EXAM: CT OF THE RIGHT FEMUR WITHOUT CONTRAST CT OF THE RIGHT TIBIA AND FIBULA WITHOUT CONTRAST CT OF THE RIGHT FOOT WITHOUT CONTRAST TECHNIQUE: Multidetector CT imaging of the right lower extremity was performed according to the standard protocol. COMPARISON:  None. FINDINGS: Bones/Joint/Cartilage FEMUR: No fracture or dislocation. Normal alignment. No joint effusion. No aggressive osseous lesion. TIBIA AND FIBULA: No fracture or dislocation. Normal alignment. No joint effusion. No aggressive osseous lesion. FOOT: No fracture or dislocation. Normal alignment. No joint effusion. No aggressive osseous lesion. Ligaments Ligaments are suboptimally evaluated by CT. Muscles and Tendons Expansion of the right rectus femoris muscle with an intramuscular hematoma measuring 6.7 x 5.7 x 2.5 cm. Remainder the muscles are normal. No other intramuscular fluid collection or hematoma. No muscle atrophy. Quadriceps tendon and patellar tendon are intact. Flexor, extensor, peroneal and Achilles tendons are intact. Soft tissue No fluid  collection or hematoma. No soft tissue mass. Generalized soft tissue edema around the right foot likely reactive. Mild soft tissue edema around the right knee. IMPRESSION: 1. Large intramuscular hematoma in the right rectus femoris muscle. Electronically Signed   By: Elige Ko   On: 10/15/2018 16:57   Ct Extremity Lower Right Wo Contrast  Result Date: 10/15/2018 CLINICAL DATA:  Burning, cramping, numbness on right upper leg. EXAM: CT OF THE RIGHT FEMUR WITHOUT CONTRAST CT OF THE RIGHT TIBIA AND FIBULA WITHOUT CONTRAST CT OF THE RIGHT FOOT WITHOUT CONTRAST TECHNIQUE: Multidetector CT imaging of the right lower extremity was performed according to the standard protocol. COMPARISON:  None. FINDINGS: Bones/Joint/Cartilage FEMUR: No fracture or dislocation. Normal alignment. No joint effusion. No aggressive osseous lesion. TIBIA AND FIBULA: No fracture or dislocation. Normal alignment. No joint effusion. No aggressive osseous lesion. FOOT: No fracture or dislocation. Normal alignment. No joint effusion. No aggressive osseous lesion. Ligaments Ligaments are suboptimally evaluated by CT. Muscles and Tendons Expansion of the right rectus femoris muscle with an intramuscular hematoma measuring 6.7  x 5.7 x 2.5 cm. Remainder the muscles are normal. No other intramuscular fluid collection or hematoma. No muscle atrophy. Quadriceps tendon and patellar tendon are intact. Flexor, extensor, peroneal and Achilles tendons are intact. Soft tissue No fluid collection or hematoma. No soft tissue mass. Generalized soft tissue edema around the right foot likely reactive. Mild soft tissue edema around the right knee. IMPRESSION: 1. Large intramuscular hematoma in the right rectus femoris muscle. Electronically Signed   By: Elige Ko   On: 10/15/2018 16:57    Medications:    Scheduled Medications: . amiodarone  400 mg Oral BID  . atorvastatin  80 mg Oral q1800  . Chlorhexidine Gluconate Cloth  6 each Topical Daily  .  digoxin  0.125 mg Oral Daily  . hydrocortisone  25 mg Rectal BID  . mouth rinse  15 mL Mouth Rinse BID  . nicotine  21 mg Transdermal Daily  . sodium chloride flush  10-40 mL Intracatheter Q12H  . sodium chloride flush  3 mL Intravenous Q12H    Infusions: . sodium chloride      PRN Medications: sodium chloride, acetaminophen, alum & mag hydroxide-simeth, ondansetron (ZOFRAN) IV, sodium chloride flush, sodium chloride flush, traMADol, witch hazel-glycerin  Assessment:   Aaron [name] is a 53 y.o. male with h/o of obesity who presented with c/o fluid retention, abdominal swelling, and peripheral edema x 3-4 weeks.  Echo showed new systolic CHF 15%.    CHF team consulted for follow up and treatment. Cath results pending at time of consultation.   Plan/Discussion:    1. Acute systolic CHF -> cardiogenic shock due to iCM - Echo 10/02/18 LVEF 15%, Normal RV, Mod LAE, Mod MR, Mild AI, Mild PI.  - Cath 3/13 showed severe 3v CAD with markedly decompensated hemodynamics - [name] pending.  - CVP 3-4 cm. - Holding diuretics with hematoma and UGI bleed.  - Continue spiro 12.5 mg daily - Continue digoxin 0.125 mg daily.  - Continue losartan 12.5 mg BID.  - No b-blocker yet with shock - Continue UNNA boots  2. CAD - Severe 3v CAD as above. Has been seen by Dr. Dorris Fetch (TCTS). Not CABG candidate currently given hemodynamics and lack of mobility.  - No s/s of ischemia.    - As the RCA has good collaterals and LAD is focal mid lesion, PCI of the LAD and LCx may be best option later this week per Dr. Kirke Corin.  - Continue ASA and statin. No b-blocker yet with shock  3. Afib with RVR, new - Off milrinone and IV amiodarone.  Rate now elevated in setting of hematoma and vomiting.  - Will transition back to IV amiodarone with poor oral intake for the time being.  - This patients CHA2DS2-VASc Score is at least 3.  - Anticoag on hold now with hematoma and UGI bleeding.   4. AKI - Cr 1.44 on  admit.  - BMET pending this am. Holding torsemide with bleeding and hematoma.   5. Elevated LFTs/shock liver - AST 110 and ALT 268 with total Bili 6.2 on admission. Trended down as of 3/12. No further checks.   - Likely passive congestion/low output CHF.   6. Morbid obesity - Body mass index is 36.07 kg/m.  - Will need sleep study as outpatient and encouraged weight loss.   7. Hypokalemia - BMET pending today.   8. Severe deconditioning due to obesity - PT and CIR following. Follow. Now set back by hematoma.   9 R thigh  hematoma - Acute, on heparin.  Now on hold. Follow closely.  - CBC pending this am.  - Holding torsemide and given fluid back.   10. Upper GI bleed - With black / coffee colored emesis overnight and + gastic occult blood. Abdomen non-tender. - Will ask GI to see.  - WIll get KUB.  Will discuss further plan with MD.   Length of Stay: [address] [place], [place] 10/16/2018, 7:34 AM  Advanced Heart Failure Team Pager [phone] (M-F; 7a - 4p)  Please contact [facility] for night-coverage after hours (4p -7a ) and weekends on [email or web address]  Patient seen and examined with the above-signed Advanced Practice Provider and/or Housestaff. I personally reviewed laboratory data, imaging studies and relevant notes. I independently examined the patient and formulated the important aspects of the plan. I have edited the note to reflect any of my changes or salient points. I have personally discussed the plan with the patient and/or family.  He has large intramuscular hematoma in R thigh yesterday. Heparin, Plavix and HF meds stopped. He was hypotensive and received 1L fluid. SBP 90-100. PCI cancelled. Manual compression with IV bag and ACE wraps applied yesterday for 4 hours. Hgb still trending down. No evidence compartment syndrome on exam  Now with nausea and coffee-ground emesis. KUB shows ileus.   We have asked Ortho and GI to see to help with management.    Continue to follow CBC. Transfuse hgb < 7.5 with possible active bleeding.   Arvilla Meres, MD  9:39 AM

## 2018-10-16 NOTE — Progress Notes (Signed)
OT Cancellation Note  Patient Details Name: Aaron [name] MRN: [id number] DOB: Dec 11, 1965   Cancelled Treatment:    Reason Eval/Treat Not Completed: Medical issues which prohibited therapy(pt with nausea and vomiting)  Evern Bio 10/16/2018, 10:22 AM  Martie Round, OTR/L Acute Rehabilitation Services Pager: [phone] Office: [phone]

## 2018-10-16 NOTE — Consult Note (Signed)
Reason for Consult:Right thigh hematoma Referring Physician: D Bensimhon  Selig Aaron [name] is an 53 y.o. male.  HPI: Aaron [name] has been on heparin 2/2 cardiac history but on Wednesday began to develop pain in his right thigh that was diagnosed as a hematoma. He also had some upper GI bleeding and the heparin was stopped. He had a compression dressing on it for some time. He had some pain and paresthesias over the area but it is better now and not bothering him too much. The paresthesias have resolved.  Past Medical History:  Diagnosis Date  . Morbid obesity ([place])     Past Surgical History:  Procedure Laterality Date  . RIGHT/LEFT HEART CATH AND CORONARY ANGIOGRAPHY N/A 10/09/2018   Procedure: RIGHT/LEFT HEART CATH AND CORONARY ANGIOGRAPHY;  Surgeon: Wellington Hampshire, MD;  Location: [facility];  Service: Cardiovascular;  Laterality: N/A;    Family History  Problem Relation Age of Onset  . Alcoholism Father   . Rheumatic fever Mother   . Diabetes Mother   . Drug abuse Brother     Social History:  reports that he has never smoked. He has never used smokeless tobacco. He reports that he does not drink alcohol or use drugs.  Allergies: No Known Allergies  Medications: I have reviewed the patient's current medications.  Results for orders placed or performed during the hospital encounter of 10/01/18 (from the past 48 hour(s))  Basic metabolic panel     Status: Abnormal   Collection Time: 10/15/18  2:41 AM  Result Value Ref Range   Sodium 131 (L) 135 - 145 mmol/L   Potassium 4.0 3.5 - 5.1 mmol/L    Comment: DELTA CHECK NOTED   Chloride 99 98 - 111 mmol/L   CO2 25 22 - 32 mmol/L   Glucose, Bld 116 (H) 70 - 99 mg/dL   BUN 16 6 - 20 mg/dL   Creatinine, Ser 0.97 0.61 - 1.24 mg/dL   Calcium 7.9 (L) 8.9 - 10.3 mg/dL   GFR calc non Af Amer >60 >60 mL/min   GFR calc Af Amer >60 >60 mL/min   Anion gap 7 5 - 15    Comment: Performed at [facility], [address],  [place], [place] [address]  Magnesium     Status: None   Collection Time: 10/15/18  9:42 AM  Result Value Ref Range   Magnesium 2.0 1.7 - 2.4 mg/dL    Comment: Performed at [facility] [address], [place], [place] [address]  Heparin level (unfractionated)     Status: None   Collection Time: 10/15/18  9:42 AM  Result Value Ref Range   Heparin Unfractionated 0.59 0.30 - 0.70 IU/mL    Comment: (NOTE) If heparin results are below expected values, and patient dosage has  been confirmed, suggest follow up testing of antithrombin III levels. Performed at [facility], [place] [address] [place] [address]   CBC with Differential/Platelet     Status: Abnormal   Collection Time: 10/15/18  9:42 AM  Result Value Ref Range   WBC 12.2 (H) 4.0 - 10.5 K/uL   RBC 3.61 (L) 4.22 - 5.81 MIL/uL   Hemoglobin 10.4 (L) 13.0 - 17.0 g/dL   HCT 31.7 (L) 39.0 - 52.0 %   MCV 87.8 80.0 - 100.0 fL   MCH 28.8 26.0 - 34.0 pg   MCHC 32.8 30.0 - 36.0 g/dL   RDW 16.3 (H) 11.5 - 15.5 %  Platelets 351 150 - 400 K/uL   nRBC 0.0 0.0 - 0.2 %   Neutrophils Relative % 83 %   Neutro Abs 10.1 (H) 1.7 - 7.7 K/uL   Lymphocytes Relative 10 %   Lymphs Abs 1.2 0.7 - 4.0 K/uL   Monocytes Relative 6 %   Monocytes Absolute 0.7 0.1 - 1.0 K/uL   Eosinophils Relative 0 %   Eosinophils Absolute 0.1 0.0 - 0.5 K/uL   Basophils Relative 0 %   Basophils Absolute 0.0 0.0 - 0.1 K/uL   Immature Granulocytes 1 %   Abs Immature Granulocytes 0.10 (H) 0.00 - 0.07 K/uL    Comment: Performed at [facility] [address], [place], [place] [address]  CBC     Status: Abnormal   Collection Time: 10/15/18  6:08 PM  Result Value Ref Range   WBC 14.1 (H) 4.0 - 10.5 K/uL   RBC 3.72 (L) 4.22 - 5.81 MIL/uL   Hemoglobin 10.5 (L) 13.0 - 17.0 g/dL   HCT 33.0 (L) 39.0 - 52.0 %   MCV 88.7 80.0 - 100.0 fL   MCH 28.2 26.0 - 34.0 pg   MCHC 31.8 30.0 - 36.0 g/dL   RDW 16.4 (H) 11.5 - 15.5 %   Platelets 404 (H) 150 - 400  K/uL   nRBC 0.0 0.0 - 0.2 %    Comment: Performed at [facility] [address], [place], [place] [address]  Type and screen [place]     Status: None   Collection Time: 10/15/18  6:08 PM  Result Value Ref Range   ABO/RH(D) A POS    Antibody Screen NEG    Sample Expiration      10/18/2018 Performed at [facility], [place] [address] [place], [place] [address]   ABO/Rh     Status: None   Collection Time: 10/15/18  6:08 PM  Result Value Ref Range   ABO/RH(D)      A POS Performed at [facility] [address] [place], [place] [address]   Occult blood gastric / duodenum     Status: Abnormal   Collection Time: 10/15/18 11:46 PM  Result Value Ref Range   pH, Gastric NOT DONE    Occult Blood, Gastric POSITIVE (A) NEGATIVE    Comment: Performed at [facility], [place] [address], [place], [place] [address]  Cooxemetry Panel (carboxy, met, total hgb, O2 sat)     Status: Abnormal   Collection Time: 10/16/18  4:25 AM  Result Value Ref Range   Total hemoglobin 10.7 (L) 12.0 - 16.0 g/dL   O2 Saturation 54.1 %   Carboxyhemoglobin 1.9 (H) 0.5 - 1.5 %   Methemoglobin 1.0 0.0 - 1.5 %  Basic metabolic panel     Status: Abnormal   Collection Time: 10/16/18  4:26 AM  Result Value Ref Range   Sodium 134 (L) 135 - 145 mmol/L   Potassium 4.6 3.5 - 5.1 mmol/L   Chloride 99 98 - 111 mmol/L   CO2 23 22 - 32 mmol/L   Glucose, Bld 108 (H) 70 - 99 mg/dL   BUN 25 (H) 6 - 20 mg/dL   Creatinine, Ser 1.45 (H) 0.61 - 1.24 mg/dL   Calcium 8.1 (L) 8.9 - 10.3 mg/dL   GFR calc non Af Amer 54 (L) >60 mL/min   GFR calc Af Amer >60 >60 mL/min   Anion gap 12 5 - 15  Comment: Performed at [facility], [place] [address], [place], [place] [address]  CBC     Status: Abnormal   Collection Time: 10/16/18  4:26 AM  Result Value Ref Range   WBC 14.2 (H) 4.0 - 10.5 K/uL   RBC 3.28 (L) 4.22 - 5.81 MIL/uL   Hemoglobin 9.2 (L) 13.0 - 17.0 g/dL   HCT 30.3 (L) 39.0 -  52.0 %   MCV 92.4 80.0 - 100.0 fL   MCH 28.0 26.0 - 34.0 pg   MCHC 30.4 30.0 - 36.0 g/dL   RDW 16.5 (H) 11.5 - 15.5 %   Platelets 409 (H) 150 - 400 K/uL   nRBC 0.0 0.0 - 0.2 %    Comment: Performed at [facility] [address] [place], [place] [address]    Dg Abd 1 View  Result Date: 10/16/2018 CLINICAL DATA:  Hematemesis.  Distention. EXAM: ABDOMEN - 1 VIEW COMPARISON:  10/10/2018. FINDINGS: Prominently distended loops of small and large bowel are noted. Stool noted in the colon including the rectum. Findings most consistent adynamic ileus. Small or large bowel obstruction can not be excluded. Close follow-up exams suggested to demonstrate resolution. Pelvic calcifications consistent phleboliths. Degenerative change lumbar spine. IMPRESSION: Probably distended loops of small and large bowel noted. Stool noted in the colon including the rectum. Findings most consistent with adynamic ileus. Small or large bowel obstruction can not be excluded. Close follow-up exam suggested demonstrate resolution. Electronically Signed   By: Marcello Moores  Register   On: 10/16/2018 08:58   Ct Tibia Fibula Right Wo Contrast  Result Date: 10/15/2018 CLINICAL DATA:  Burning, cramping, numbness on right upper leg. EXAM: CT OF THE RIGHT FEMUR WITHOUT CONTRAST CT OF THE RIGHT TIBIA AND FIBULA WITHOUT CONTRAST CT OF THE RIGHT FOOT WITHOUT CONTRAST TECHNIQUE: Multidetector CT imaging of the right lower extremity was performed according to the standard protocol. COMPARISON:  None. FINDINGS: Bones/Joint/Cartilage FEMUR: No fracture or dislocation. Normal alignment. No joint effusion. No aggressive osseous lesion. TIBIA AND FIBULA: No fracture or dislocation. Normal alignment. No joint effusion. No aggressive osseous lesion. FOOT: No fracture or dislocation. Normal alignment. No joint effusion. No aggressive osseous lesion. Ligaments Ligaments are suboptimally evaluated by CT. Muscles and Tendons Expansion of the right rectus  femoris muscle with an intramuscular hematoma measuring 6.7 x 5.7 x 2.5 cm. Remainder the muscles are normal. No other intramuscular fluid collection or hematoma. No muscle atrophy. Quadriceps tendon and patellar tendon are intact. Flexor, extensor, peroneal and Achilles tendons are intact. Soft tissue No fluid collection or hematoma. No soft tissue mass. Generalized soft tissue edema around the right foot likely reactive. Mild soft tissue edema around the right knee. IMPRESSION: 1. Large intramuscular hematoma in the right rectus femoris muscle. Electronically Signed   By: Kathreen Devoid   On: 10/15/2018 16:57   Ct Foot Right Wo Contrast  Result Date: 10/15/2018 CLINICAL DATA:  Burning, cramping, numbness on right upper leg. EXAM: CT OF THE RIGHT FEMUR WITHOUT CONTRAST CT OF THE RIGHT TIBIA AND FIBULA WITHOUT CONTRAST CT OF THE RIGHT FOOT WITHOUT CONTRAST TECHNIQUE: Multidetector CT imaging of the right lower extremity was performed according to the standard protocol. COMPARISON:  None. FINDINGS: Bones/Joint/Cartilage FEMUR: No fracture or dislocation. Normal alignment. No joint effusion. No aggressive osseous lesion. TIBIA AND FIBULA: No fracture or dislocation. Normal alignment. No joint effusion. No aggressive osseous lesion. FOOT: No fracture or dislocation. Normal alignment. No joint effusion. No aggressive osseous lesion. Ligaments Ligaments are  suboptimally evaluated by CT. Muscles and Tendons Expansion of the right rectus femoris muscle with an intramuscular hematoma measuring 6.7 x 5.7 x 2.5 cm. Remainder the muscles are normal. No other intramuscular fluid collection or hematoma. No muscle atrophy. Quadriceps tendon and patellar tendon are intact. Flexor, extensor, peroneal and Achilles tendons are intact. Soft tissue No fluid collection or hematoma. No soft tissue mass. Generalized soft tissue edema around the right foot likely reactive. Mild soft tissue edema around the right knee. IMPRESSION: 1.  Large intramuscular hematoma in the right rectus femoris muscle. Electronically Signed   By: Kathreen Devoid   On: 10/15/2018 16:57   Ct Extremity Lower Right Wo Contrast  Result Date: 10/15/2018 CLINICAL DATA:  Burning, cramping, numbness on right upper leg. EXAM: CT OF THE RIGHT FEMUR WITHOUT CONTRAST CT OF THE RIGHT TIBIA AND FIBULA WITHOUT CONTRAST CT OF THE RIGHT FOOT WITHOUT CONTRAST TECHNIQUE: Multidetector CT imaging of the right lower extremity was performed according to the standard protocol. COMPARISON:  None. FINDINGS: Bones/Joint/Cartilage FEMUR: No fracture or dislocation. Normal alignment. No joint effusion. No aggressive osseous lesion. TIBIA AND FIBULA: No fracture or dislocation. Normal alignment. No joint effusion. No aggressive osseous lesion. FOOT: No fracture or dislocation. Normal alignment. No joint effusion. No aggressive osseous lesion. Ligaments Ligaments are suboptimally evaluated by CT. Muscles and Tendons Expansion of the right rectus femoris muscle with an intramuscular hematoma measuring 6.7 x 5.7 x 2.5 cm. Remainder the muscles are normal. No other intramuscular fluid collection or hematoma. No muscle atrophy. Quadriceps tendon and patellar tendon are intact. Flexor, extensor, peroneal and Achilles tendons are intact. Soft tissue No fluid collection or hematoma. No soft tissue mass. Generalized soft tissue edema around the right foot likely reactive. Mild soft tissue edema around the right knee. IMPRESSION: 1. Large intramuscular hematoma in the right rectus femoris muscle. Electronically Signed   By: Kathreen Devoid   On: 10/15/2018 16:57    Review of Systems  Constitutional: Negative for weight loss.  HENT: Negative for ear discharge, ear pain, hearing loss and tinnitus.   Eyes: Negative for blurred vision, double vision, photophobia and pain.  Respiratory: Negative for cough, sputum production and shortness of breath.   Cardiovascular: Negative for chest pain.   Gastrointestinal: Positive for nausea and vomiting. Negative for abdominal pain.  Genitourinary: Negative for dysuria, flank pain, frequency and urgency.  Musculoskeletal: Positive for myalgias (Right thigh). Negative for back pain, falls and neck pain.  Neurological: Negative for dizziness, tingling, sensory change, focal weakness, loss of consciousness and headaches.  Endo/Heme/Allergies: Does not bruise/bleed easily.  Psychiatric/Behavioral: Negative for depression, memory loss and substance abuse. The patient is not nervous/anxious.    Blood pressure (!) 101/59, pulse (!) 105, temperature 99 F (37.2 C), temperature source Oral, resp. rate (!) 25, height 6' 4"  (1.93 m), weight 134.4 kg, SpO2 98 %. Physical Exam  Constitutional: He appears well-developed and well-nourished. No distress.  HENT:  Head: Normocephalic and atraumatic.  Eyes: Conjunctivae are normal. Right eye exhibits no discharge. Left eye exhibits no discharge. No scleral icterus.  Neck: Normal range of motion.  Cardiovascular: Normal rate and regular rhythm.  Respiratory: Effort normal. No respiratory distress.  Musculoskeletal:     Comments: RLE No traumatic wounds, ecchymosis, or rash  Proximal anterior thigh with firm but not tight hematoma  No knee or ankle effusion  Knee stable to varus/ valgus and anterior/posterior stress  Sens DPN, SPN, TN intact  Motor EHL, ext, flex, evers 5/5  DP 2+, PT 1+, No significant edema  Neurological: He is alert.  Skin: Skin is warm and dry. He is not diaphoretic.  Psychiatric: He has a normal mood and affect. His behavior is normal.    Assessment/Plan: Right thigh hematoma -- No s/sx of compartment syndrome. Given symptoms aren't debilitating would recommend watchful waiting at this point. Discussed aspiration but don't think the benefits outweigh the risks at this point; pt and wife agreed. If symptoms worsen we can always revisit. Recommend continued compressive dressing as  long as tolerated and no skin issues arise.    Lisette Abu, PA-C Orthopedic Surgery [phone] 10/16/2018, 10:05 AM

## 2018-10-17 ENCOUNTER — Inpatient Hospital Stay (HOSPITAL_COMMUNITY): Payer: Medicaid Other

## 2018-10-17 DIAGNOSIS — M7981 Nontraumatic hematoma of soft tissue: Secondary | ICD-10-CM

## 2018-10-17 LAB — CBC
HCT: 26.2 % — ABNORMAL LOW (ref 39.0–52.0)
HCT: 24.8 % — ABNORMAL LOW (ref 39.0–52.0)
Hemoglobin: 8.4 g/dL — ABNORMAL LOW (ref 13.0–17.0)
Hemoglobin: 7.7 g/dL — ABNORMAL LOW (ref 13.0–17.0)
MCH: 28.9 pg (ref 26.0–34.0)
MCH: 28.1 pg (ref 26.0–34.0)
MCHC: 32.1 g/dL (ref 30.0–36.0)
MCHC: 31 g/dL (ref 30.0–36.0)
MCV: 90 fL (ref 80.0–100.0)
MCV: 90.5 fL (ref 80.0–100.0)
NRBC: 0 % (ref 0.0–0.2)
Platelets: 341 10*3/uL (ref 150–400)
Platelets: 299 10*3/uL (ref 150–400)
RBC: 2.91 MIL/uL — ABNORMAL LOW (ref 4.22–5.81)
RBC: 2.74 MIL/uL — ABNORMAL LOW (ref 4.22–5.81)
RDW: 16.9 % — ABNORMAL HIGH (ref 11.5–15.5)
RDW: 16.9 % — ABNORMAL HIGH (ref 11.5–15.5)
WBC: 5 10*3/uL (ref 4.0–10.5)
WBC: 4.2 10*3/uL (ref 4.0–10.5)
nRBC: 0 % (ref 0.0–0.2)

## 2018-10-17 LAB — BASIC METABOLIC PANEL
Anion gap: 13 (ref 5–15)
BUN: 40 mg/dL — ABNORMAL HIGH (ref 6–20)
CO2: 21 mmol/L — ABNORMAL LOW (ref 22–32)
Calcium: 8 mg/dL — ABNORMAL LOW (ref 8.9–10.3)
Chloride: 98 mmol/L (ref 98–111)
Creatinine, Ser: 1.96 mg/dL — ABNORMAL HIGH (ref 0.61–1.24)
GFR calc Af Amer: 44 mL/min — ABNORMAL LOW (ref >60)
GFR calc non Af Amer: 38 mL/min — ABNORMAL LOW (ref >60)
Glucose, Bld: 105 mg/dL — ABNORMAL HIGH (ref 70–99)
Potassium: 4.5 mmol/L (ref 3.5–5.1)
SODIUM: 132 mmol/L — AB (ref 135–145)

## 2018-10-17 LAB — DIGOXIN LEVEL: DIGOXIN LVL: 0.4 ng/mL — AB (ref 0.8–2.0)

## 2018-10-17 LAB — COOXEMETRY PANEL
Carboxyhemoglobin: 1.6 % — ABNORMAL HIGH (ref 0.5–1.5)
Methemoglobin: 1.4 % (ref 0.0–1.5)
O2 Saturation: 66.5 %
Total hemoglobin: 13.2 g/dL (ref 12.0–16.0)

## 2018-10-17 MED ORDER — DEXTROSE-NACL 5-0.45 % IV SOLN
INTRAVENOUS | Status: AC
  Administered 2018-10-17 – 2018-10-18 (×2): via INTRAVENOUS

## 2018-10-17 NOTE — Progress Notes (Signed)
Subjective: Patient was seen and examined at bedside. Reorts passing flatus, no bowel movements. Improvement in abdominal distention after NG tube placement.  Objective: Vital signs in last 24 hours: Temp:  [97.4 F (36.3 C)-99.4 F (37.4 C)] 98.4 F (36.9 C) (03/21 0839) Pulse Rate:  [33-103] 95 (03/21 0839) Resp:  [16-27] 27 (03/21 0839) BP: (86-123)/(51-98) 93/64 (03/21 0839) SpO2:  [89 %-99 %] 95 % (03/21 0839) Weight change:  Last BM Date: 10/14/18  PE: NG tube draining greenish bile GENERAL: Obese, not in distress ABDOMEN: Less distended than yesterday, hypoactive bowel sounds, nontender EXTREMITIES: Bilateral lower extremity compression stockings, right thigh hematoma  Lab Results: Results for orders placed or performed during the hospital encounter of 10/01/18 (from the past 48 hour(s))  CBC     Status: Abnormal   Collection Time: 10/15/18  6:08 PM  Result Value Ref Range   WBC 14.1 (H) 4.0 - 10.5 K/uL   RBC 3.72 (L) 4.22 - 5.81 MIL/uL   Hemoglobin 10.5 (L) 13.0 - 17.0 g/dL   HCT 33.0 (L) 39.0 - 52.0 %   MCV 88.7 80.0 - 100.0 fL   MCH 28.2 26.0 - 34.0 pg   MCHC 31.8 30.0 - 36.0 g/dL   RDW 16.4 (H) 11.5 - 15.5 %   Platelets 404 (H) 150 - 400 K/uL   nRBC 0.0 0.0 - 0.2 %    Comment: Performed at [facility], [address], [place], [place] [address]  Type and screen [place]     Status: None   Collection Time: 10/15/18  6:08 PM  Result Value Ref Range   ABO/RH(D) A POS    Antibody Screen NEG    Sample Expiration      10/18/2018 Performed at [facility], [place] [address], [place], [place] [address]   ABO/Rh     Status: None   Collection Time: 10/15/18  6:08 PM  Result Value Ref Range   ABO/RH(D)      A POS Performed at [facility] [address], [place], [place] [address]   Occult blood gastric / duodenum     Status: Abnormal   Collection Time: 10/15/18 11:46 PM  Result Value Ref Range   pH, Gastric NOT DONE     Occult Blood, Gastric POSITIVE (A) NEGATIVE    Comment: Performed at [facility], [place] [address] [place], [place] [address]  Cooxemetry Panel (carboxy, met, total hgb, O2 sat)     Status: Abnormal   Collection Time: 10/16/18  4:25 AM  Result Value Ref Range   Total hemoglobin 10.7 (L) 12.0 - 16.0 g/dL   O2 Saturation 54.1 %   Carboxyhemoglobin 1.9 (H) 0.5 - 1.5 %   Methemoglobin 1.0 0.0 - 1.5 %  Basic metabolic panel     Status: Abnormal   Collection Time: 10/16/18  4:26 AM  Result Value Ref Range   Sodium 134 (L) 135 - 145 mmol/L   Potassium 4.6 3.5 - 5.1 mmol/L   Chloride 99 98 - 111 mmol/L   CO2 23 22 - 32 mmol/L   Glucose, Bld 108 (H) 70 - 99 mg/dL   BUN 25 (H) 6 - 20 mg/dL   Creatinine, Ser 1.45 (H) 0.61 - 1.24 mg/dL   Calcium 8.1 (L) 8.9 - 10.3 mg/dL   GFR calc non Af Amer 54 (L) >60 mL/min   GFR calc Af Amer >60 >60 mL/min   Anion gap 12 5 -  15    Comment: Performed at [facility], [place] [address], [place], [place] [address]  CBC     Status: Abnormal   Collection Time: 10/16/18  4:26 AM  Result Value Ref Range   WBC 14.2 (H) 4.0 - 10.5 K/uL   RBC 3.28 (L) 4.22 - 5.81 MIL/uL   Hemoglobin 9.2 (L) 13.0 - 17.0 g/dL   HCT 30.3 (L) 39.0 - 52.0 %   MCV 92.4 80.0 - 100.0 fL   MCH 28.0 26.0 - 34.0 pg   MCHC 30.4 30.0 - 36.0 g/dL   RDW 16.5 (H) 11.5 - 15.5 %   Platelets 409 (H) 150 - 400 K/uL   nRBC 0.0 0.0 - 0.2 %    Comment: Performed at [facility] [address] [place], [place] [address]  Heparin level (unfractionated)     Status: Abnormal   Collection Time: 10/16/18  9:23 AM  Result Value Ref Range   Heparin Unfractionated <0.10 (L) 0.30 - 0.70 IU/mL    Comment: (NOTE) If heparin results are below expected values, and patient dosage has  been confirmed, suggest follow up testing of antithrombin III levels. Performed at [facility], [place] [address] [place], [place] [address]   CBC     Status: Abnormal   Collection Time: 10/16/18  3:29  PM  Result Value Ref Range   WBC 7.5 4.0 - 10.5 K/uL   RBC 3.13 (L) 4.22 - 5.81 MIL/uL   Hemoglobin 9.1 (L) 13.0 - 17.0 g/dL   HCT 28.1 (L) 39.0 - 52.0 %   MCV 89.8 80.0 - 100.0 fL   MCH 29.1 26.0 - 34.0 pg   MCHC 32.4 30.0 - 36.0 g/dL   RDW 16.8 (H) 11.5 - 15.5 %   Platelets 362 150 - 400 K/uL   nRBC 0.0 0.0 - 0.2 %    Comment: Performed at [facility] [address] [place], [place] [address]  CBC     Status: Abnormal   Collection Time: 10/17/18  3:17 AM  Result Value Ref Range   WBC 5.0 4.0 - 10.5 K/uL   RBC 2.91 (L) 4.22 - 5.81 MIL/uL   Hemoglobin 8.4 (L) 13.0 - 17.0 g/dL   HCT 26.2 (L) 39.0 - 52.0 %   MCV 90.0 80.0 - 100.0 fL   MCH 28.9 26.0 - 34.0 pg   MCHC 32.1 30.0 - 36.0 g/dL   RDW 16.9 (H) 11.5 - 15.5 %   Platelets 341 150 - 400 K/uL   nRBC 0.0 0.0 - 0.2 %    Comment: Performed at [facility], [place] [address], [place], [place] [address]  Basic metabolic panel     Status: Abnormal   Collection Time: 10/17/18  3:17 AM  Result Value Ref Range   Sodium 132 (L) 135 - 145 mmol/L   Potassium 4.5 3.5 - 5.1 mmol/L   Chloride 98 98 - 111 mmol/L   CO2 21 (L) 22 - 32 mmol/L   Glucose, Bld 105 (H) 70 - 99 mg/dL   BUN 40 (H) 6 - 20 mg/dL   Creatinine, Ser 1.96 (H) 0.61 - 1.24 mg/dL   Calcium 8.0 (L) 8.9 - 10.3 mg/dL   GFR calc non Af Amer 38 (L) >60 mL/min   GFR calc Af Amer 44 (L) >60 mL/min   Anion gap 13 5 - 15    Comment: Performed at [facility] [place],  [place] [address]  Digoxin level     Status: Abnormal   Collection Time: 10/17/18  3:17 AM  Result Value Ref Range   Digoxin Level 0.4 (L) 0.8 - 2.0 ng/mL    Comment: Performed at [facility], [place] [address], [place], [place] [address]  Cooxemetry Panel (carboxy, met, total hgb, O2 sat)     Status: Abnormal   Collection Time: 10/17/18  4:20 AM  Result Value Ref Range   Total hemoglobin 13.2 12.0 - 16.0 g/dL   O2 Saturation 66.5 %   Carboxyhemoglobin 1.6 (H) 0.5 - 1.5 %    Methemoglobin 1.4 0.0 - 1.5 %    Studies/Results: Dg Abd 1 View  Result Date: 10/17/2018 CLINICAL DATA:  Small bowel obstruction. EXAM: ABDOMEN - 1 VIEW COMPARISON:  10/16/2018 FINDINGS: Nasogastric tube in the upper abdomen and appears to be in the stomach body region. Again noted are dilated loops of small and large bowel. Bowel gas distension is similar to the previous examination. Limited evaluation for free air on these supine images. Stool in the rectum. IMPRESSION: Stable dilatation of small and large bowel loops. Differential diagnosis includes an ileus versus colonic obstruction. Nasogastric tube appears to be in the stomach. Electronically Signed   By: Markus Daft M.D.   On: 10/17/2018 09:19   Dg Abd 1 View  Result Date: 10/16/2018 CLINICAL DATA:  Nasogastric tube placement EXAM: ABDOMEN - 1 VIEW COMPARISON:  October 16, 2018 abdominal radiographs obtained earlier in the day FINDINGS: Nasogastric tube tip is at the gastroesophageal junction. There remains dilated bowel. No free air evident. IMPRESSION: Nasogastric tube tip at gastroesophageal junction. Advise advancing nasogastric tube approximately 8 cm to insure that tip and side port are well within the stomach. Persistent bowel dilatation noted. These results will be called to the ordering clinician or representative by the Radiology Department at the imaging location. Electronically Signed   By: Lowella Grip III M.D.   On: 10/16/2018 13:34   Dg Abd 1 View  Result Date: 10/16/2018 CLINICAL DATA:  Hematemesis.  Distention. EXAM: ABDOMEN - 1 VIEW COMPARISON:  10/10/2018. FINDINGS: Prominently distended loops of small and large bowel are noted. Stool noted in the colon including the rectum. Findings most consistent adynamic ileus. Small or large bowel obstruction can not be excluded. Close follow-up exams suggested to demonstrate resolution. Pelvic calcifications consistent phleboliths. Degenerative change lumbar spine. IMPRESSION:  Probably distended loops of small and large bowel noted. Stool noted in the colon including the rectum. Findings most consistent with adynamic ileus. Small or large bowel obstruction can not be excluded. Close follow-up exam suggested demonstrate resolution. Electronically Signed   By: Marcello Moores  Register   On: 10/16/2018 08:58   Ct Tibia Fibula Right Wo Contrast  Result Date: 10/15/2018 CLINICAL DATA:  Burning, cramping, numbness on right upper leg. EXAM: CT OF THE RIGHT FEMUR WITHOUT CONTRAST CT OF THE RIGHT TIBIA AND FIBULA WITHOUT CONTRAST CT OF THE RIGHT FOOT WITHOUT CONTRAST TECHNIQUE: Multidetector CT imaging of the right lower extremity was performed according to the standard protocol. COMPARISON:  None. FINDINGS: Bones/Joint/Cartilage FEMUR: No fracture or dislocation. Normal alignment. No joint effusion. No aggressive osseous lesion. TIBIA AND FIBULA: No fracture or dislocation. Normal alignment. No joint effusion. No aggressive osseous lesion. FOOT: No fracture or dislocation. Normal alignment. No joint effusion. No aggressive osseous lesion. Ligaments Ligaments are suboptimally evaluated by CT. Muscles and Tendons Expansion of the right rectus femoris muscle with an intramuscular hematoma measuring 6.7 x 5.7  x 2.5 cm. Remainder the muscles are normal. No other intramuscular fluid collection or hematoma. No muscle atrophy. Quadriceps tendon and patellar tendon are intact. Flexor, extensor, peroneal and Achilles tendons are intact. Soft tissue No fluid collection or hematoma. No soft tissue mass. Generalized soft tissue edema around the right foot likely reactive. Mild soft tissue edema around the right knee. IMPRESSION: 1. Large intramuscular hematoma in the right rectus femoris muscle. Electronically Signed   By: Kathreen Devoid   On: 10/15/2018 16:57   Ct Foot Right Wo Contrast  Result Date: 10/15/2018 CLINICAL DATA:  Burning, cramping, numbness on right upper leg. EXAM: CT OF THE RIGHT FEMUR WITHOUT  CONTRAST CT OF THE RIGHT TIBIA AND FIBULA WITHOUT CONTRAST CT OF THE RIGHT FOOT WITHOUT CONTRAST TECHNIQUE: Multidetector CT imaging of the right lower extremity was performed according to the standard protocol. COMPARISON:  None. FINDINGS: Bones/Joint/Cartilage FEMUR: No fracture or dislocation. Normal alignment. No joint effusion. No aggressive osseous lesion. TIBIA AND FIBULA: No fracture or dislocation. Normal alignment. No joint effusion. No aggressive osseous lesion. FOOT: No fracture or dislocation. Normal alignment. No joint effusion. No aggressive osseous lesion. Ligaments Ligaments are suboptimally evaluated by CT. Muscles and Tendons Expansion of the right rectus femoris muscle with an intramuscular hematoma measuring 6.7 x 5.7 x 2.5 cm. Remainder the muscles are normal. No other intramuscular fluid collection or hematoma. No muscle atrophy. Quadriceps tendon and patellar tendon are intact. Flexor, extensor, peroneal and Achilles tendons are intact. Soft tissue No fluid collection or hematoma. No soft tissue mass. Generalized soft tissue edema around the right foot likely reactive. Mild soft tissue edema around the right knee. IMPRESSION: 1. Large intramuscular hematoma in the right rectus femoris muscle. Electronically Signed   By: Kathreen Devoid   On: 10/15/2018 16:57   Ct Extremity Lower Right Wo Contrast  Result Date: 10/15/2018 CLINICAL DATA:  Burning, cramping, numbness on right upper leg. EXAM: CT OF THE RIGHT FEMUR WITHOUT CONTRAST CT OF THE RIGHT TIBIA AND FIBULA WITHOUT CONTRAST CT OF THE RIGHT FOOT WITHOUT CONTRAST TECHNIQUE: Multidetector CT imaging of the right lower extremity was performed according to the standard protocol. COMPARISON:  None. FINDINGS: Bones/Joint/Cartilage FEMUR: No fracture or dislocation. Normal alignment. No joint effusion. No aggressive osseous lesion. TIBIA AND FIBULA: No fracture or dislocation. Normal alignment. No joint effusion. No aggressive osseous lesion.  FOOT: No fracture or dislocation. Normal alignment. No joint effusion. No aggressive osseous lesion. Ligaments Ligaments are suboptimally evaluated by CT. Muscles and Tendons Expansion of the right rectus femoris muscle with an intramuscular hematoma measuring 6.7 x 5.7 x 2.5 cm. Remainder the muscles are normal. No other intramuscular fluid collection or hematoma. No muscle atrophy. Quadriceps tendon and patellar tendon are intact. Flexor, extensor, peroneal and Achilles tendons are intact. Soft tissue No fluid collection or hematoma. No soft tissue mass. Generalized soft tissue edema around the right foot likely reactive. Mild soft tissue edema around the right knee. IMPRESSION: 1. Large intramuscular hematoma in the right rectus femoris muscle. Electronically Signed   By: Kathreen Devoid   On: 10/15/2018 16:57    Medications: I have reviewed the patient's current medications.  Assessment: Coffee-ground emesis with persistent stable dilatation of small and large bowel abdominal x-ray today Hemoglobin dropped from 9.1-8.4, elevated BUN/creatinine ratio of 40/1.96 Remains hypotensive but not tachycardic   Plan: EGD in a.m., keep patient n.p.o., continue NG tube to low intermittent suction, monitor H&H and transfuse if hemoglobin less than 7, new Protonix 40  mg every 12 hours.   Ronnette Juniper 10/17/2018, 9:47 AM   Pager [phone] If no answer or after 5 PM call [phone]

## 2018-10-17 NOTE — H&P (View-Only) (Signed)
Subjective: Patient was seen and examined at bedside. Reorts passing flatus, no bowel movements. Improvement in abdominal distention after NG tube placement.  Objective: Vital signs in last 24 hours: Temp:  [97.4 F (36.3 C)-99.4 F (37.4 C)] 98.4 F (36.9 C) (03/21 0839) Pulse Rate:  [33-103] 95 (03/21 0839) Resp:  [16-27] 27 (03/21 0839) BP: (86-123)/(51-98) 93/64 (03/21 0839) SpO2:  [89 %-99 %] 95 % (03/21 0839) Weight change:  Last BM Date: 10/14/18  PE: NG tube draining greenish bile GENERAL: Obese, not in distress ABDOMEN: Less distended than yesterday, hypoactive bowel sounds, nontender EXTREMITIES: Bilateral lower extremity compression stockings, right thigh hematoma  Lab Results: Results for orders placed or performed during the hospital encounter of 10/01/18 (from the past 48 hour(s))  CBC     Status: Abnormal   Collection Time: 10/15/18  6:08 PM  Result Value Ref Range   WBC 14.1 (H) 4.0 - 10.5 K/uL   RBC 3.72 (L) 4.22 - 5.81 MIL/uL   Hemoglobin 10.5 (L) 13.0 - 17.0 g/dL   HCT 33.0 (L) 39.0 - 52.0 %   MCV 88.7 80.0 - 100.0 fL   MCH 28.2 26.0 - 34.0 pg   MCHC 31.8 30.0 - 36.0 g/dL   RDW 16.4 (H) 11.5 - 15.5 %   Platelets 404 (H) 150 - 400 K/uL   nRBC 0.0 0.0 - 0.2 %    Comment: Performed at [facility], [address] [place], [place] [address]  Type and screen [facility]     Status: None   Collection Time: 10/15/18  6:08 PM  Result Value Ref Range   ABO/RH(D) A POS    Antibody Screen NEG    Sample Expiration      10/18/2018 Performed at [facility], [address] [place], [place] [address]   ABO/Rh     Status: None   Collection Time: 10/15/18  6:08 PM  Result Value Ref Range   ABO/RH(D)      A POS Performed at [facility], [address] [place], [place] [address]   Occult blood gastric / duodenum     Status: Abnormal   Collection Time: 10/15/18 11:46 PM  Result Value Ref Range   pH, Gastric NOT DONE     Occult Blood, Gastric POSITIVE (A) NEGATIVE    Comment: Performed at  [facility]ospital [facility], [address] , [place] [address]  Cooxemetry Panel (carboxy, met, total hgb, O2 sat)     Status: Abnormal   Collection Time: 10/16/18  4:25 AM  Result Value Ref Range   Total hemoglobin 10.7 (L) 12.0 - 16.0 g/dL   O2 Saturation 54.1 %   Carboxyhemoglobin 1.9 (H) 0.5 - 1.5 %   Methemoglobin 1.0 0.0 - 1.5 %  Basic metabolic panel     Status: Abnormal   Collection Time: 10/16/18  4:26 AM  Result Value Ref Range   Sodium 134 (L) 135 - 145 mmol/L   Potassium 4.6 3.5 - 5.1 mmol/L   Chloride 99 98 - 111 mmol/L   CO2 23 22 - 32 mmol/L   Glucose, Bld 108 (H) 70 - 99 mg/dL   BUN 25 (H) 6 - 20 mg/dL   Creatinine, Ser 1.45 (H) 0.61 - 1.24 mg/dL   Calcium 8.1 (L) 8.9 - 10.3 mg/dL   GFR calc non Af Amer 54 (L) >60 mL/min   GFR calc Af Amer >60 >60 mL/min   Anion gap 12 5 -   15    Comment: Performed at [facility], [address] [place], [place] [address]  CBC     Status: Abnormal   Collection Time: 10/16/18  4:26 AM  Result Value Ref Range   WBC 14.2 (H) 4.0 - 10.5 K/uL   RBC 3.28 (L) 4.22 - 5.81 MIL/uL   Hemoglobin 9.2 (L) 13.0 - 17.0 g/dL   HCT 30.3 (L) 39.0 - 52.0 %   MCV 92.4 80.0 - 100.0 fL   MCH 28.0 26.0 - 34.0 pg   MCHC 30.4 30.0 - 36.0 g/dL   RDW 16.5 (H) 11.5 - 15.5 %   Platelets 409 (H) 150 - 400 K/uL   nRBC 0.0 0.0 - 0.2 %    Comment: Performed at [facility], [address] [place], [place] [address]  Heparin level (unfractionated)     Status: Abnormal   Collection Time: 10/16/18  9:23 AM  Result Value Ref Range   Heparin Unfractionated <0.10 (L) 0.30 - 0.70 IU/mL    Comment: (NOTE) If heparin results are below expected values, and patient dosage has  been confirmed, suggest follow up testing of antithrombin III levels. Performed at [facility], [address] [place], [place] [address]   CBC     Status: Abnormal   Collection Time: 10/16/18  3:29  PM  Result Value Ref Range   WBC 7.5 4.0 - 10.5 K/uL   RBC 3.13 (L) 4.22 - 5.81 MIL/uL   Hemoglobin 9.1 (L) 13.0 - 17.0 g/dL   HCT 28.1 (L) 39.0 - 52.0 %   MCV 89.8 80.0 - 100.0 fL   MCH 29.1 26.0 - 34.0 pg   MCHC 32.4 30.0 - 36.0 g/dL   RDW 16.8 (H) 11.5 - 15.5 %   Platelets 362 150 - 400 K/uL   nRBC 0.0 0.0 - 0.2 %    Comment: Performed at [facility], [address] [place], [place] [address]  CBC     Status: Abnormal   Collection Time: 10/17/18  3:17 AM  Result Value Ref Range   WBC 5.0 4.0 - 10.5 K/uL   RBC 2.91 (L) 4.22 - 5.81 MIL/uL   Hemoglobin 8.4 (L) 13.0 - 17.0 g/dL   HCT 26.2 (L) 39.0 - 52.0 %   MCV 90.0 80.0 - 100.0 fL   MCH 28.9 26.0 - 34.0 pg   MCHC 32.1 30.0 - 36.0 g/dL   RDW 16.9 (H) 11.5 - 15.5 %   Platelets 341 150 - 400 K/uL   nRBC 0.0 0.0 - 0.2 %    Comment: Performed at [facility], [address] [place], [place] [address]  Basic metabolic panel     Status: Abnormal   Collection Time: 10/17/18  3:17 AM  Result Value Ref Range   Sodium 132 (L) 135 - 145 mmol/L   Potassium 4.5 3.5 - 5.1 mmol/L   Chloride 98 98 - 111 mmol/L   CO2 21 (L) 22 - 32 mmol/L   Glucose, Bld 105 (H) 70 - 99 mg/dL   BUN 40 (H) 6 - 20 mg/dL   Creatinine, Ser 1.96 (H) 0.61 - 1.24 mg/dL   Calcium 8.0 (L) 8.9 - 10.3 mg/dL   GFR calc non Af Amer 38 (L) >60 mL/min   GFR calc Af Amer 44 (L) >60 mL/min   Anion gap 13 5 - 15    Comment: Performed at [facility], [address] [place],   [place] [address]  Digoxin level     Status: Abnormal   Collection Time: 10/17/18  3:17 AM  Result Value Ref Range   Digoxin Level 0.4 (L) 0.8 - 2.0 ng/mL    Comment: Performed at [facility], [address] [place], [place] [address]  Cooxemetry Panel (carboxy, met, total hgb, O2 sat)     Status: Abnormal   Collection Time: 10/17/18  4:20 AM  Result Value Ref Range   Total hemoglobin 13.2 12.0 - 16.0 g/dL   O2 Saturation 66.5 %   Carboxyhemoglobin 1.6 (H) 0.5 - 1.5 %    Methemoglobin 1.4 0.0 - 1.5 %    Studies/Results: Dg Abd 1 View  Result Date: 10/17/2018 CLINICAL DATA:  Small bowel obstruction. EXAM: ABDOMEN - 1 VIEW COMPARISON:  10/16/2018 FINDINGS: Nasogastric tube in the upper abdomen and appears to be in the stomach body region. Again noted are dilated loops of small and large bowel. Bowel gas distension is similar to the previous examination. Limited evaluation for free air on these supine images. Stool in the rectum. IMPRESSION: Stable dilatation of small and large bowel loops. Differential diagnosis includes an ileus versus colonic obstruction. Nasogastric tube appears to be in the stomach. Electronically Signed   By: Adam  Henn M.D.   On: 10/17/2018 09:19   Dg Abd 1 View  Result Date: 10/16/2018 CLINICAL DATA:  Nasogastric tube placement EXAM: ABDOMEN - 1 VIEW COMPARISON:  October 16, 2018 abdominal radiographs obtained earlier in the day FINDINGS: Nasogastric tube tip is at the gastroesophageal junction. There remains dilated bowel. No free air evident. IMPRESSION: Nasogastric tube tip at gastroesophageal junction. Advise advancing nasogastric tube approximately 8 cm to insure that tip and side port are well within the stomach. Persistent bowel dilatation noted. These results will be called to the ordering clinician or representative by the Radiology Department at the imaging location. Electronically Signed   By: William  Woodruff III M.D.   On: 10/16/2018 13:34   Dg Abd 1 View  Result Date: 10/16/2018 CLINICAL DATA:  Hematemesis.  Distention. EXAM: ABDOMEN - 1 VIEW COMPARISON:  10/10/2018. FINDINGS: Prominently distended loops of small and large bowel are noted. Stool noted in the colon including the rectum. Findings most consistent adynamic ileus. Small or large bowel obstruction can not be excluded. Close follow-up exams suggested to demonstrate resolution. Pelvic calcifications consistent phleboliths. Degenerative change lumbar spine. IMPRESSION:  Probably distended loops of small and large bowel noted. Stool noted in the colon including the rectum. Findings most consistent with adynamic ileus. Small or large bowel obstruction can not be excluded. Close follow-up exam suggested demonstrate resolution. Electronically Signed   By: Thomas  Register   On: 10/16/2018 08:58   Ct Tibia Fibula Right Wo Contrast  Result Date: 10/15/2018 CLINICAL DATA:  Burning, cramping, numbness on right upper leg. EXAM: CT OF THE RIGHT FEMUR WITHOUT CONTRAST CT OF THE RIGHT TIBIA AND FIBULA WITHOUT CONTRAST CT OF THE RIGHT FOOT WITHOUT CONTRAST TECHNIQUE: Multidetector CT imaging of the right lower extremity was performed according to the standard protocol. COMPARISON:  None. FINDINGS: Bones/Joint/Cartilage FEMUR: No fracture or dislocation. Normal alignment. No joint effusion. No aggressive osseous lesion. TIBIA AND FIBULA: No fracture or dislocation. Normal alignment. No joint effusion. No aggressive osseous lesion. FOOT: No fracture or dislocation. Normal alignment. No joint effusion. No aggressive osseous lesion. Ligaments Ligaments are suboptimally evaluated by CT. Muscles and Tendons Expansion of the right rectus femoris muscle with an intramuscular hematoma measuring 6.7 x 5.7   x 2.5 cm. Remainder the muscles are normal. No other intramuscular fluid collection or hematoma. No muscle atrophy. Quadriceps tendon and patellar tendon are intact. Flexor, extensor, peroneal and Achilles tendons are intact. Soft tissue No fluid collection or hematoma. No soft tissue mass. Generalized soft tissue edema around the right foot likely reactive. Mild soft tissue edema around the right knee. IMPRESSION: 1. Large intramuscular hematoma in the right rectus femoris muscle. Electronically Signed   By: Hetal  Patel   On: 10/15/2018 16:57   Ct Foot Right Wo Contrast  Result Date: 10/15/2018 CLINICAL DATA:  Burning, cramping, numbness on right upper leg. EXAM: CT OF THE RIGHT FEMUR WITHOUT  CONTRAST CT OF THE RIGHT TIBIA AND FIBULA WITHOUT CONTRAST CT OF THE RIGHT FOOT WITHOUT CONTRAST TECHNIQUE: Multidetector CT imaging of the right lower extremity was performed according to the standard protocol. COMPARISON:  None. FINDINGS: Bones/Joint/Cartilage FEMUR: No fracture or dislocation. Normal alignment. No joint effusion. No aggressive osseous lesion. TIBIA AND FIBULA: No fracture or dislocation. Normal alignment. No joint effusion. No aggressive osseous lesion. FOOT: No fracture or dislocation. Normal alignment. No joint effusion. No aggressive osseous lesion. Ligaments Ligaments are suboptimally evaluated by CT. Muscles and Tendons Expansion of the right rectus femoris muscle with an intramuscular hematoma measuring 6.7 x 5.7 x 2.5 cm. Remainder the muscles are normal. No other intramuscular fluid collection or hematoma. No muscle atrophy. Quadriceps tendon and patellar tendon are intact. Flexor, extensor, peroneal and Achilles tendons are intact. Soft tissue No fluid collection or hematoma. No soft tissue mass. Generalized soft tissue edema around the right foot likely reactive. Mild soft tissue edema around the right knee. IMPRESSION: 1. Large intramuscular hematoma in the right rectus femoris muscle. Electronically Signed   By: Hetal  Patel   On: 10/15/2018 16:57   Ct Extremity Lower Right Wo Contrast  Result Date: 10/15/2018 CLINICAL DATA:  Burning, cramping, numbness on right upper leg. EXAM: CT OF THE RIGHT FEMUR WITHOUT CONTRAST CT OF THE RIGHT TIBIA AND FIBULA WITHOUT CONTRAST CT OF THE RIGHT FOOT WITHOUT CONTRAST TECHNIQUE: Multidetector CT imaging of the right lower extremity was performed according to the standard protocol. COMPARISON:  None. FINDINGS: Bones/Joint/Cartilage FEMUR: No fracture or dislocation. Normal alignment. No joint effusion. No aggressive osseous lesion. TIBIA AND FIBULA: No fracture or dislocation. Normal alignment. No joint effusion. No aggressive osseous lesion.  FOOT: No fracture or dislocation. Normal alignment. No joint effusion. No aggressive osseous lesion. Ligaments Ligaments are suboptimally evaluated by CT. Muscles and Tendons Expansion of the right rectus femoris muscle with an intramuscular hematoma measuring 6.7 x 5.7 x 2.5 cm. Remainder the muscles are normal. No other intramuscular fluid collection or hematoma. No muscle atrophy. Quadriceps tendon and patellar tendon are intact. Flexor, extensor, peroneal and Achilles tendons are intact. Soft tissue No fluid collection or hematoma. No soft tissue mass. Generalized soft tissue edema around the right foot likely reactive. Mild soft tissue edema around the right knee. IMPRESSION: 1. Large intramuscular hematoma in the right rectus femoris muscle. Electronically Signed   By: Hetal  Patel   On: 10/15/2018 16:57    Medications: I have reviewed the patient's current medications.  Assessment: Coffee-ground emesis with persistent stable dilatation of small and large bowel abdominal x-ray today Hemoglobin dropped from 9.1-8.4, elevated BUN/creatinine ratio of 40/1.96 Remains hypotensive but not tachycardic   Plan: EGD in a.m., keep patient n.p.o., continue NG tube to low intermittent suction, monitor H&H and transfuse if hemoglobin less than 7, new Protonix 40   mg every 12 hours.   Nitara Szczerba 10/17/2018, 9:47 AM   Pager [phone] If no answer or after 5 PM call [phone] 

## 2018-10-17 NOTE — Progress Notes (Addendum)
Advanced Heart Failure Rounding Note   Subjective:    NGT placed yesterday for decompression. Nauseas and ab distension improved but still with some nausea. NGT draining green bile. GI has seen. Planning for EGD in am.   Seen by Ortho with recommendation for bracing with standing and CAM treatments  Denies CP or SOB. Remains in AF. Off heparin. SBP remain in 90s  CVP 6   Objective:   Weight Range:  Vital Signs:   Temp:  [97.4 F (36.3 C)-99.4 F (37.4 C)] 98.4 F (36.9 C) (03/21 0839) Pulse Rate:  [36-103] 95 (03/21 0839) Resp:  [16-27] 27 (03/21 0839) BP: (86-123)/(51-98) 93/64 (03/21 0839) SpO2:  [89 %-95 %] 95 % (03/21 0839) Last BM Date: 10/14/18  Weight change: Filed Weights   10/14/18 0450 10/15/18 0500 10/16/18 0533  Weight: 131.9 kg 133.4 kg 134.4 kg   Intake/Output:   Intake/Output Summary (Last 24 hours) at 10/17/2018 1041 Last data filed at 10/17/2018 0700 Gross per 24 hour  Intake 58 ml  Output 530 ml  Net -472 ml    Physical Exam   General: Obese male lying in bed. With NGT in place HEENT: normal. + NGT Neck: supple. no JVD. Carotids 2+ bilat; no bruits. No lymphadenopathy or thryomegaly appreciated. Cor: PMI nondisplaced. Irregular rate & rhythm. No rubs, gallops or murmurs. Lungs: clear Abdomen: obese soft, nontender, mildly distended. No hepatosplenomegaly. No bruits or masses. Hypoactive bowel sounds. Extremities: no cyanosis, clubbing, rash, edema Neuro: alert & orientedx3, cranial nerves grossly intact. R leg weak . Affect pleasant   Telemetry   Afib 90s, personally reviewed.    Labs    Basic Metabolic Panel: Recent Labs  Lab 10/12/18 0452 10/13/18 0531 10/14/18 0254 10/15/18 0241 10/15/18 0942 10/16/18 0426 10/17/18 0317  NA 133* 134* 135 131*  --  134* 132*  K 3.9 3.9 3.3* 4.0  --  4.6 4.5  CL 103 101 101 99  --  99 98  CO2 25 23 28 25   --  23 21*  GLUCOSE 114* 97 98 116*  --  108* 105*  BUN 12 16 14 16   --  25* 40*    CREATININE 0.79 0.90 0.94 0.97  --  1.45* 1.96*  CALCIUM 7.6* 8.2* 8.1* 7.9*  --  8.1* 8.0*  MG 2.0  --   --   --  2.0  --   --    Liver Function Tests: No results for input(s): AST, ALT, ALKPHOS, BILITOT, PROT, ALBUMIN in the last 168 hours. No results for input(s): LIPASE, AMYLASE in the last 168 hours. No results for input(s): AMMONIA in the last 168 hours.  CBC: Recent Labs  Lab 10/15/18 0942 10/15/18 1808 10/16/18 0426 10/16/18 1529 10/17/18 0317  WBC 12.2* 14.1* 14.2* 7.5 5.0  NEUTROABS 10.1*  --   --   --   --   HGB 10.4* 10.5* 9.2* 9.1* 8.4*  HCT 31.7* 33.0* 30.3* 28.1* 26.2*  MCV 87.8 88.7 92.4 89.8 90.0  PLT 351 404* 409* 362 341   Cardiac Enzymes: No results for input(s): CKTOTAL, CKMB, CKMBINDEX, TROPONINI in the last 168 hours.  BNP: BNP (last 3 results) Recent Labs    10/01/18 1337 10/01/18 1720  BNP 2,769.2* 2,333.1*   ProBNP (last 3 results) No results for input(s): PROBNP in the last 8760 hours.  Other results:  Imaging: Dg Abd 1 View  Result Date: 10/17/2018 CLINICAL DATA:  Small bowel obstruction. EXAM: ABDOMEN - 1 VIEW COMPARISON:  10/16/2018 FINDINGS: Nasogastric tube in the upper abdomen and appears to be in the stomach body region. Again noted are dilated loops of small and large bowel. Bowel gas distension is similar to the previous examination. Limited evaluation for free air on these supine images. Stool in the rectum. IMPRESSION: Stable dilatation of small and large bowel loops. Differential diagnosis includes an ileus versus colonic obstruction. Nasogastric tube appears to be in the stomach. Electronically Signed   By: Richarda Overlie M.D.   On: 10/17/2018 09:19   Dg Abd 1 View  Result Date: 10/16/2018 CLINICAL DATA:  Nasogastric tube placement EXAM: ABDOMEN - 1 VIEW COMPARISON:  October 16, 2018 abdominal radiographs obtained earlier in the day FINDINGS: Nasogastric tube tip is at the gastroesophageal junction. There remains dilated bowel. No  free air evident. IMPRESSION: Nasogastric tube tip at gastroesophageal junction. Advise advancing nasogastric tube approximately 8 cm to insure that tip and side port are well within the stomach. Persistent bowel dilatation noted. These results will be called to the ordering clinician or representative by the Radiology Department at the imaging location. Electronically Signed   By: Bretta Bang III M.D.   On: 10/16/2018 13:34   Dg Abd 1 View  Result Date: 10/16/2018 CLINICAL DATA:  Hematemesis.  Distention. EXAM: ABDOMEN - 1 VIEW COMPARISON:  10/10/2018. FINDINGS: Prominently distended loops of small and large bowel are noted. Stool noted in the colon including the rectum. Findings most consistent adynamic ileus. Small or large bowel obstruction can not be excluded. Close follow-up exams suggested to demonstrate resolution. Pelvic calcifications consistent phleboliths. Degenerative change lumbar spine. IMPRESSION: Probably distended loops of small and large bowel noted. Stool noted in the colon including the rectum. Findings most consistent with adynamic ileus. Small or large bowel obstruction can not be excluded. Close follow-up exam suggested demonstrate resolution. Electronically Signed   By: Maisie Fus  Register   On: 10/16/2018 08:58   Ct Tibia Fibula Right Wo Contrast  Result Date: 10/15/2018 CLINICAL DATA:  Burning, cramping, numbness on right upper leg. EXAM: CT OF THE RIGHT FEMUR WITHOUT CONTRAST CT OF THE RIGHT TIBIA AND FIBULA WITHOUT CONTRAST CT OF THE RIGHT FOOT WITHOUT CONTRAST TECHNIQUE: Multidetector CT imaging of the right lower extremity was performed according to the standard protocol. COMPARISON:  None. FINDINGS: Bones/Joint/Cartilage FEMUR: No fracture or dislocation. Normal alignment. No joint effusion. No aggressive osseous lesion. TIBIA AND FIBULA: No fracture or dislocation. Normal alignment. No joint effusion. No aggressive osseous lesion. FOOT: No fracture or dislocation. Normal  alignment. No joint effusion. No aggressive osseous lesion. Ligaments Ligaments are suboptimally evaluated by CT. Muscles and Tendons Expansion of the right rectus femoris muscle with an intramuscular hematoma measuring 6.7 x 5.7 x 2.5 cm. Remainder the muscles are normal. No other intramuscular fluid collection or hematoma. No muscle atrophy. Quadriceps tendon and patellar tendon are intact. Flexor, extensor, peroneal and Achilles tendons are intact. Soft tissue No fluid collection or hematoma. No soft tissue mass. Generalized soft tissue edema around the right foot likely reactive. Mild soft tissue edema around the right knee. IMPRESSION: 1. Large intramuscular hematoma in the right rectus femoris muscle. Electronically Signed   By: Elige Ko   On: 10/15/2018 16:57   Ct Foot Right Wo Contrast  Result Date: 10/15/2018 CLINICAL DATA:  Burning, cramping, numbness on right upper leg. EXAM: CT OF THE RIGHT FEMUR WITHOUT CONTRAST CT OF THE RIGHT TIBIA AND FIBULA WITHOUT CONTRAST CT OF THE RIGHT FOOT WITHOUT CONTRAST TECHNIQUE: Multidetector CT imaging of  the right lower extremity was performed according to the standard protocol. COMPARISON:  None. FINDINGS: Bones/Joint/Cartilage FEMUR: No fracture or dislocation. Normal alignment. No joint effusion. No aggressive osseous lesion. TIBIA AND FIBULA: No fracture or dislocation. Normal alignment. No joint effusion. No aggressive osseous lesion. FOOT: No fracture or dislocation. Normal alignment. No joint effusion. No aggressive osseous lesion. Ligaments Ligaments are suboptimally evaluated by CT. Muscles and Tendons Expansion of the right rectus femoris muscle with an intramuscular hematoma measuring 6.7 x 5.7 x 2.5 cm. Remainder the muscles are normal. No other intramuscular fluid collection or hematoma. No muscle atrophy. Quadriceps tendon and patellar tendon are intact. Flexor, extensor, peroneal and Achilles tendons are intact. Soft tissue No fluid collection or  hematoma. No soft tissue mass. Generalized soft tissue edema around the right foot likely reactive. Mild soft tissue edema around the right knee. IMPRESSION: 1. Large intramuscular hematoma in the right rectus femoris muscle. Electronically Signed   By: Elige Ko   On: 10/15/2018 16:57   Ct Extremity Lower Right Wo Contrast  Result Date: 10/15/2018 CLINICAL DATA:  Burning, cramping, numbness on right upper leg. EXAM: CT OF THE RIGHT FEMUR WITHOUT CONTRAST CT OF THE RIGHT TIBIA AND FIBULA WITHOUT CONTRAST CT OF THE RIGHT FOOT WITHOUT CONTRAST TECHNIQUE: Multidetector CT imaging of the right lower extremity was performed according to the standard protocol. COMPARISON:  None. FINDINGS: Bones/Joint/Cartilage FEMUR: No fracture or dislocation. Normal alignment. No joint effusion. No aggressive osseous lesion. TIBIA AND FIBULA: No fracture or dislocation. Normal alignment. No joint effusion. No aggressive osseous lesion. FOOT: No fracture or dislocation. Normal alignment. No joint effusion. No aggressive osseous lesion. Ligaments Ligaments are suboptimally evaluated by CT. Muscles and Tendons Expansion of the right rectus femoris muscle with an intramuscular hematoma measuring 6.7 x 5.7 x 2.5 cm. Remainder the muscles are normal. No other intramuscular fluid collection or hematoma. No muscle atrophy. Quadriceps tendon and patellar tendon are intact. Flexor, extensor, peroneal and Achilles tendons are intact. Soft tissue No fluid collection or hematoma. No soft tissue mass. Generalized soft tissue edema around the right foot likely reactive. Mild soft tissue edema around the right knee. IMPRESSION: 1. Large intramuscular hematoma in the right rectus femoris muscle. Electronically Signed   By: Elige Ko   On: 10/15/2018 16:57    Medications:    Scheduled Medications:  Chlorhexidine Gluconate Cloth  6 each Topical Daily   digoxin  0.125 mg Intravenous Daily   hydrocortisone  25 mg Rectal BID   mouth  rinse  15 mL Mouth Rinse BID   nicotine  21 mg Transdermal Daily   pantoprazole (PROTONIX) IV  40 mg Intravenous Q12H   sodium chloride flush  10-40 mL Intracatheter Q12H   sodium chloride flush  3 mL Intravenous Q12H    Infusions:  sodium chloride     amiodarone 30 mg/hr (10/17/18 1024)    PRN Medications: sodium chloride, acetaminophen, alum & mag hydroxide-simeth, ondansetron (ZOFRAN) IV, sodium chloride flush, sodium chloride flush, witch hazel-glycerin  Assessment:   Aaron [name] is a 53 y.o. male with h/o of obesity who presented with c/o fluid retention, abdominal swelling, and peripheral edema x 3-4 weeks.  Echo showed new systolic CHF 15%.    CHF team consulted for follow up and treatment. Cath results pending at time of consultation.   Plan/Discussion:    1. Acute systolic CHF -> cardiogenic shock due to iCM - Echo 10/02/18 LVEF 15%, Normal RV, Mod LAE, Mod MR, Mild AI, Mild  PI.  - Cath 3/13 showed severe 3v CAD with markedly decompensated hemodynamics - Off milrinone co-ox 66% - Volume status ok - Holding diuretics, spiro and losartan with hypotension in setting of R thigh hematoma and UGI bleed.  - Continue digoxin 0.125 mg daily.  - No b-blocker yet with shock - Continue UNNA boots  2. CAD - Severe 3v CAD as above. Has been seen by Dr. Dorris Fetch ([facility]). Not CABG candidate currently given hemodynamics and lack of mobility. Plan was for PCI of LCX and LAD but given large R leg hematoma now plan for medical management,  - No s/s of ischemia.    - Continue ASA and statin. No b-blocker yet with shock. Plavix stopped   3. Afib with RVR, new - Off milrinone. - Back on IV amio at 30 given NPO status and elevated rates. HR improved - This patients CHA2DS2-VASc Score is at least 3.  - Anticoag on hold now with hematoma and UGI bleeding.   4. Acute large R thigh hematoma with acute blood loss anemia - Heparin off. Ortho has seen -> appreciate their recs  -  Has involvement of rectus femoris muscle and femoral nerve.  - Hgb drifting down slowly 9.1 -> 8.4. Will continue to follow - Will need SCDs   5.  Acute ileus/SBO with Upper GI bleed - GI following. - symptomatically improved with NGT decompression. KUB unchanged (Personally reviewed) - on protonix - Keep NPO - For EGD tomorrow   4. AKI - Cr 1.44 on admit. Climbing today up to 1.96. Due to hypotension and ATN.  - Diuretics on hold. With NPO status will hydrate gently x 12 hours with d5 1/2 NS at 50  5. Elevated LFTs/shock liver - AST 110 and ALT 268 with total Bili 6.2 on admission. Trended down as of 3/12. No further checks.   - Likely passive congestion/low output CHF.   6. Morbid obesity - Body mass index is 36.07 kg/m.  - Will need sleep study as outpatient and encouraged weight loss.   7. Hypokalemia - K 4.5 today  8. Severe deconditioning due to obesity - PT and CIR following. Follow. Now set back by hematoma.    Length of Stay: 16  Arvilla Meres, MD 10/17/2018, 10:41 AM  Advanced Heart Failure Team Pager [phone] (M-F; 7a - 4p)  Please contact [facility] for night-coverage after hours (4p -7a ) and weekends on [email or web address]

## 2018-10-17 NOTE — Progress Notes (Signed)
Orthopedic Tech Progress Note Patient Details:  Aaron [name] 08-18-1965 [id number]  Patient ID: [name], male   DOB: 05-02-1966, 53 y.o.   MRN: [id number]   [name] 10/17/2018, 11:05 AMCalled [employer] for right Bledsoe brace.

## 2018-10-18 ENCOUNTER — Encounter (HOSPITAL_COMMUNITY)
Admission: EM | Disposition: A | Discharge status: Rehabilitation Facility | Payer: Self-pay | Source: Home / Self Care | Attending: Internal Medicine

## 2018-10-18 ENCOUNTER — Inpatient Hospital Stay (HOSPITAL_COMMUNITY): Payer: Medicaid Other | Admitting: Certified Registered Nurse Anesthetist

## 2018-10-18 ENCOUNTER — Encounter (HOSPITAL_COMMUNITY): Payer: Self-pay | Admitting: Gastroenterology

## 2018-10-18 HISTORY — PX: ESOPHAGOGASTRODUODENOSCOPY (EGD) WITH PROPOFOL: SHX5813

## 2018-10-18 LAB — CBC
HCT: 24.7 % — ABNORMAL LOW (ref 39.0–52.0)
HCT: 25.4 % — ABNORMAL LOW (ref 39.0–52.0)
HEMOGLOBIN: 7.9 g/dL — AB (ref 13.0–17.0)
Hemoglobin: 7.9 g/dL — ABNORMAL LOW (ref 13.0–17.0)
MCH: 29.5 pg (ref 26.0–34.0)
MCH: 28.2 pg (ref 26.0–34.0)
MCHC: 32 g/dL (ref 30.0–36.0)
MCHC: 31.1 g/dL (ref 30.0–36.0)
MCV: 92.2 fL (ref 80.0–100.0)
MCV: 90.7 fL (ref 80.0–100.0)
NRBC: 0 % (ref 0.0–0.2)
Platelets: 311 10*3/uL (ref 150–400)
Platelets: 344 10*3/uL (ref 150–400)
RBC: 2.68 MIL/uL — ABNORMAL LOW (ref 4.22–5.81)
RBC: 2.8 MIL/uL — ABNORMAL LOW (ref 4.22–5.81)
RDW: 16.9 % — AB (ref 11.5–15.5)
RDW: 16.6 % — ABNORMAL HIGH (ref 11.5–15.5)
WBC: 4.3 10*3/uL (ref 4.0–10.5)
WBC: 5.4 10*3/uL (ref 4.0–10.5)
nRBC: 0 % (ref 0.0–0.2)

## 2018-10-18 LAB — BASIC METABOLIC PANEL
Anion gap: 8 (ref 5–15)
BUN: 38 mg/dL — ABNORMAL HIGH (ref 6–20)
CO2: 25 mmol/L (ref 22–32)
Calcium: 7.6 mg/dL — ABNORMAL LOW (ref 8.9–10.3)
Chloride: 97 mmol/L — ABNORMAL LOW (ref 98–111)
Creatinine, Ser: 1.62 mg/dL — ABNORMAL HIGH (ref 0.61–1.24)
GFR calc Af Amer: 55 mL/min — ABNORMAL LOW (ref >60)
GFR, EST NON AFRICAN AMERICAN: 47 mL/min — AB (ref >60)
Glucose, Bld: 225 mg/dL — ABNORMAL HIGH (ref 70–99)
Potassium: 3.8 mmol/L (ref 3.5–5.1)
SODIUM: 130 mmol/L — AB (ref 135–145)

## 2018-10-18 LAB — COOXEMETRY PANEL
CARBOXYHEMOGLOBIN: 2 % — AB (ref 0.5–1.5)
Methemoglobin: 1.1 % (ref 0.0–1.5)
O2 Saturation: 78.9 %
Total hemoglobin: 8.9 g/dL — ABNORMAL LOW (ref 12.0–16.0)

## 2018-10-18 SURGERY — ESOPHAGOGASTRODUODENOSCOPY (EGD) WITH PROPOFOL
Anesthesia: Monitor Anesthesia Care

## 2018-10-18 MED ORDER — BUTAMBEN-TETRACAINE-BENZOCAINE 2-2-14 % EX AERO
INHALATION_SPRAY | CUTANEOUS | Status: DC | PRN
  Administered 2018-10-18: 2 via TOPICAL

## 2018-10-18 MED ORDER — PROPOFOL 10 MG/ML IV BOLUS
INTRAVENOUS | Status: DC | PRN
  Administered 2018-10-18: 10 mg via INTRAVENOUS

## 2018-10-18 MED ORDER — SODIUM CHLORIDE 0.9 % IV SOLN
INTRAVENOUS | Status: DC | PRN
  Administered 2018-10-18: 08:00:00 via INTRAVENOUS

## 2018-10-18 MED ORDER — PROPOFOL 500 MG/50ML IV EMUL
INTRAVENOUS | Status: DC | PRN
  Administered 2018-10-18: 50 ug/kg/min via INTRAVENOUS

## 2018-10-18 MED ORDER — PHENYLEPHRINE 40 MCG/ML (10ML) SYRINGE FOR IV PUSH (FOR BLOOD PRESSURE SUPPORT)
PREFILLED_SYRINGE | INTRAVENOUS | Status: DC | PRN
  Administered 2018-10-18: 80 ug via INTRAVENOUS

## 2018-10-18 MED ORDER — SODIUM CHLORIDE 0.9 % IV SOLN: INTRAVENOUS | Status: DC

## 2018-10-18 SURGICAL SUPPLY — 15 items

## 2018-10-18 NOTE — Progress Notes (Signed)
Patient refused CPAP tonight. There Isn't a machine in the room at this time. RN aware. Explained to Patient that if they changed their mind, to just have the RN call Respiratory and we would come set them up. 

## 2018-10-18 NOTE — Progress Notes (Signed)
Advanced Heart Failure Rounding Note   Subjective:    NGT placed 3/20 for decompression. KUB suggestive of ileus. Starting to feel better.  EGD today       - LA Grade C esophagitis.                           - Non-bleeding esophageal ulcers.                           - Non-bleeding erosive gastropathy.                           - Erythematous and nodular mucosa in   RLE still weak and now has CAM device to mobilize it.   Denies CP or SOB. Got IVF yesterday as he was NPO with NGT. Still on D51/2 NS at 75. Creatinine improving. Co-ox 79%. NGT out after EGD. Starting to pass gas. Feeling in RLE has returned   Hgb stable at 7.9    Objective:   Weight Range:  Vital Signs:   Temp:  [97.7 F (36.5 C)-98.9 F (37.2 C)] 97.7 F (36.5 C) (03/22 0800) Pulse Rate:  [63-89] 81 (03/22 0820) Resp:  [16-22] 20 (03/22 0820) BP: (85-101)/(46-68) 97/66 (03/22 0820) SpO2:  [91 %-95 %] 94 % (03/22 0820) Weight:  [137.1 kg] 137.1 kg (03/22 0716) Last BM Date: 10/18/18  Weight change: Filed Weights   10/16/18 0533 10/18/18 0535 10/18/18 0716  Weight: 134.4 kg (!) 137.1 kg (!) 137.1 kg   Intake/Output:   Intake/Output Summary (Last 24 hours) at 10/18/2018 1125 Last data filed at 10/18/2018 0335 Gross per 24 hour  Intake 1702.82 ml  Output 840 ml  Net 862.82 ml    Physical Exam   General: Obese male lying in bed. NAD  Weak appearing  HEENT: normal Neck: supple. JVP 6 . Carotids 2+ bilat; no bruits. No lymphadenopathy or thryomegaly appreciated. Cor: PMI nondisplaced. Regular rate & rhythm. No rubs, gallops or murmurs. Lungs: clear Abdomen: obese soft, nontender, +distended. No hepatosplenomegaly. No bruits or masses. Hypoactive bowel sounds. Extremities: no cyanosis, clubbing, rash, edema RUE PICC. Brace  on RLE. +UNNA boots Neuro: alert & orientedx3, cranial nerves grossly intact. RLE weak Affect pleasant    Telemetry   Afib 80-90s, personally reviewed.    Labs    Basic  Metabolic Panel: Recent Labs  Lab 10/12/18 0452  10/14/18 0254 10/15/18 0241 10/15/18 0942 10/16/18 0426 10/17/18 0317 10/18/18 0333  NA 133*   < > 135 131*  --  134* 132* 130*  K 3.9   < > 3.3* 4.0  --  4.6 4.5 3.8  CL 103   < > 101 99  --  99 98 97*  CO2 25   < > 28 25  --  23 21* 25  GLUCOSE 114*   < > 98 116*  --  108* 105* 225*  BUN 12   < > 14 16  --  25* 40* 38*  CREATININE 0.79   < > 0.94 0.97  --  1.45* 1.96* 1.62*  CALCIUM 7.6*   < > 8.1* 7.9*  --  8.1* 8.0* 7.6*  MG 2.0  --   --   --  2.0  --   --   --    < > = values in this interval not displayed.   Liver Function  Tests: No results for input(s): AST, ALT, ALKPHOS, BILITOT, PROT, ALBUMIN in the last 168 hours. No results for input(s): LIPASE, AMYLASE in the last 168 hours. No results for input(s): AMMONIA in the last 168 hours.  CBC: Recent Labs  Lab 10/15/18 0942  10/16/18 0426 10/16/18 1529 10/17/18 0317 10/17/18 1800 10/18/18 0333  WBC 12.2*   < > 14.2* 7.5 5.0 4.2 4.3  NEUTROABS 10.1*  --   --   --   --   --   --   HGB 10.4*   < > 9.2* 9.1* 8.4* 7.7* 7.9*  HCT 31.7*   < > 30.3* 28.1* 26.2* 24.8* 24.7*  MCV 87.8   < > 92.4 89.8 90.0 90.5 92.2  PLT 351   < > 409* 362 341 299 311   < > = values in this interval not displayed.   Cardiac Enzymes: No results for input(s): CKTOTAL, CKMB, CKMBINDEX, TROPONINI in the last 168 hours.  BNP: BNP (last 3 results) Recent Labs    10/01/18 1337 10/01/18 1720  BNP 2,769.2* 2,333.1*   ProBNP (last 3 results) No results for input(s): PROBNP in the last 8760 hours.  Other results:  Imaging: Dg Abd 1 View  Result Date: 10/17/2018 CLINICAL DATA:  Small bowel obstruction. EXAM: ABDOMEN - 1 VIEW COMPARISON:  10/16/2018 FINDINGS: Nasogastric tube in the upper abdomen and appears to be in the stomach body region. Again noted are dilated loops of small and large bowel. Bowel gas distension is similar to the previous examination. Limited evaluation for free air on  these supine images. Stool in the rectum. IMPRESSION: Stable dilatation of small and large bowel loops. Differential diagnosis includes an ileus versus colonic obstruction. Nasogastric tube appears to be in the stomach. Electronically Signed   By: Richarda Overlie M.D.   On: 10/17/2018 09:19   Dg Abd 1 View  Result Date: 10/16/2018 CLINICAL DATA:  Nasogastric tube placement EXAM: ABDOMEN - 1 VIEW COMPARISON:  October 16, 2018 abdominal radiographs obtained earlier in the day FINDINGS: Nasogastric tube tip is at the gastroesophageal junction. There remains dilated bowel. No free air evident. IMPRESSION: Nasogastric tube tip at gastroesophageal junction. Advise advancing nasogastric tube approximately 8 cm to insure that tip and side port are well within the stomach. Persistent bowel dilatation noted. These results will be called to the ordering clinician or representative by the Radiology Department at the imaging location. Electronically Signed   By: Bretta Bang III M.D.   On: 10/16/2018 13:34    Medications:    Scheduled Medications: . Chlorhexidine Gluconate Cloth  6 each Topical Daily  . digoxin  0.125 mg Intravenous Daily  . hydrocortisone  25 mg Rectal BID  . mouth rinse  15 mL Mouth Rinse BID  . nicotine  21 mg Transdermal Daily  . pantoprazole (PROTONIX) IV  40 mg Intravenous Q12H  . sodium chloride flush  10-40 mL Intracatheter Q12H  . sodium chloride flush  3 mL Intravenous Q12H    Infusions: . sodium chloride    . amiodarone 30 mg/hr (10/18/18 1041)  . dextrose 5 % and 0.45% NaCl 75 mL/hr at 10/18/18 0335    PRN Medications: sodium chloride, acetaminophen, alum & mag hydroxide-simeth, ondansetron (ZOFRAN) IV, sodium chloride flush, sodium chloride flush, witch hazel-glycerin  Assessment:   Danis Filipovic is a 53 y.o. male with h/o of obesity who presented with c/o fluid retention, abdominal swelling, and peripheral edema x 3-4 weeks.  Echo showed new systolic CHF 15%.  CHF  team consulted for follow up and treatment. Cath results pending at time of consultation.   Plan/Discussion:    1. Acute systolic CHF -> cardiogenic shock due to iCM - Echo 10/02/18 LVEF 15%, Normal RV, Mod LAE, Mod MR, Mild AI, Mild PI.  - Cath 3/13 showed severe 3v CAD with markedly decompensated hemodynamics - Off milrinone co-ox 79% - Volume status ok. Received IVF due to AKI and NPO status. Now taking ice chips and clears. Will stop IVF after this bag  - Holding diuretics, spiro and losartan with hypotension in setting of R thigh hematoma and UGI bleed.  - Continue digoxin 0.125 mg daily.  - No b-blocker yet with shock - Continue UNNA boots  2. CAD - Severe 3v CAD as above. Has been seen by Dr. Dorris Fetch ([facility]). Not CABG candidate currently given hemodynamics and lack of mobility. Plan was for PCI of LCX and LAD but given large R leg hematoma now plan for medical management,  - No s/s of ischemia.    - Continue ASA and statin. No b-blocker yet with shock. Plavix stopped   3. Afib with RVR, new - Off milrinone. - Back on IV amio at 30 given NPO status and elevated rates. HR improved - This patients CHA2DS2-VASc Score is at least 3.  - Anticoag on hold now with hematoma and UGI bleeding.   4. Acute large R thigh hematoma with acute blood loss anemia - Heparin off. Ortho has seen -> appreciate their recs  - Has involvement of rectus femoris muscle and femoral nerve.  - Hgb seems to have nadired: 9.1 -> 8.4 -> 7.7-> 7.9. Will continue to follow - Continue SCDs   5.  Acute ileus/SBO with Upper GI bleed - GI following. - symptomatically improved with NGT decompression. NGT now out. Advanced diet as tolerated - on protonix - EGD results noted. Will follow GI recs - Repeat KUB tomorrow  4. AKI - Cr 1.44 on admit. Peaked at 1.96 on 3/21 due to hypotension and ATN. Today 1.62 - Diuretics on hold. Will stop IVF as we advance diet today  5. Elevated LFTs/shock liver - AST 110  and ALT 268 with total Bili 6.2 on admission. Trended down as of 3/12. No further checks.   - Likely passive congestion/low output CHF.   6. Morbid obesity - Body mass index is 36.79 kg/m.  - Will need sleep study as outpatient and encouraged weight loss.   7. Hypokalemia - K 3.8 today. Will supp  8. Severe deconditioning due to obesity - PT and CIR following. Follow. Now set back by hematoma.    Length of Stay: 17  Arvilla Meres, MD 10/18/2018, 11:25 AM  Advanced Heart Failure Team Pager [phone] (M-F; 7a - 4p)  Please contact [facility] for night-coverage after hours (4p -7a ) and weekends on [email or web address]

## 2018-10-18 NOTE — Brief Op Note (Signed)
10/01/2018 - 10/18/2018  8:01 AM  PATIENT:  Aaron [name]  53 y.o. male  PRE-OPERATIVE DIAGNOSIS:  hemetamesis  POST-OPERATIVE DIAGNOSIS:  * No post-op diagnosis entered *  PROCEDURE:  Procedure(s): ESOPHAGOGASTRODUODENOSCOPY (EGD) WITH PROPOFOL (N/A)  SURGEON:  Surgeon(s) and Role:    Ronnette Juniper, MD - Primary  PHYSICIAN ASSISTANT:   ASSISTANTS: Angus Seller, RN, Elspeth Cho, Tech  ANESTHESIA:   MAC  EBL:  None  BLOOD ADMINISTERED:none  DRAINS: none   LOCAL MEDICATIONS USED:  NONE  SPECIMEN:  No Specimen  DISPOSITION OF SPECIMEN:  N/A  COUNTS:  YES  TOURNIQUET:  * No tourniquets in log *  DICTATION: .Dragon Dictation  PLAN OF CARE: Admit to inpatient   PATIENT DISPOSITION:  PACU - hemodynamically stable.   Delay start of Pharmacological VTE agent (>24hrs) due to surgical blood loss or risk of bleeding: yes

## 2018-10-18 NOTE — Progress Notes (Signed)
Orthopedic Tech Progress Note Patient Details:  Aaron [name] 02-11-1966 [id number]  CPM Right Knee CPM Right Knee: Off Right Knee Flexion (Degrees): 60 Right Knee Extension (Degrees): 0 Additional Comments: foot roll  Post Interventions Patient Tolerated: Well Instructions Provided: Care of device, Adjustment of device  [name] 10/18/2018, 3:55 PM

## 2018-10-18 NOTE — Progress Notes (Signed)
PT Cancellation Note  Patient Details Name: Aaron [name] MRN: [id number] DOB: [birth year]   Cancelled Treatment:     Pt for EGD today.  Note ortho note on 10-16-18 for need for locked Bledsoe brace when up.  Will not see pt today due to OR.  WIll follow as able.   Judson Roch 10/18/2018, 12:02 PM

## 2018-10-18 NOTE — Progress Notes (Signed)
Orthopedic Tech Progress Note Patient Details:  Aaron [name] [birth year] [id number]  CPM Right Knee CPM Right Knee: On Right Knee Flexion (Degrees): 60 Right Knee Extension (Degrees): 0 Additional Comments: foot roll  Post Interventions Patient Tolerated: Well Instructions Provided: Care of device, Adjustment of device  [name] 10/18/2018, 2:04 PM

## 2018-10-18 NOTE — Anesthesia Preprocedure Evaluation (Signed)
Anesthesia Evaluation  Patient identified by MRN, date of birth, ID band Patient awake    Reviewed: Allergy & Precautions, NPO status , Patient's Chart, lab work & pertinent test results  Airway Mallampati: II  TM Distance: >3 FB     Dental  (+) Teeth Intact   Pulmonary    breath sounds clear to auscultation       Cardiovascular  Rhythm:Irregular Rate:Normal     Neuro/Psych    GI/Hepatic   Endo/Other    Renal/GU      Musculoskeletal   Abdominal   Peds  Hematology   Anesthesia Other Findings   Reproductive/Obstetrics                             Anesthesia Physical Anesthesia Plan  ASA: III  Anesthesia Plan: MAC   Post-op Pain Management:    Induction: Intravenous  PONV Risk Score and Plan:   Airway Management Planned: Natural Airway and Simple Face Mask  Additional Equipment:   Intra-op Plan:   Post-operative Plan:   Informed Consent: I have reviewed the patients History and Physical, chart, labs and discussed the procedure including the risks, benefits and alternatives for the proposed anesthesia with the patient or authorized representative who has indicated his/her understanding and acceptance.       Plan Discussed with: CRNA and Anesthesiologist  Anesthesia Plan Comments:         Anesthesia Quick Evaluation

## 2018-10-18 NOTE — Anesthesia Postprocedure Evaluation (Signed)
Anesthesia Post Note  Patient: Aaron [name]  Procedure(s) Performed: ESOPHAGOGASTRODUODENOSCOPY (EGD) WITH PROPOFOL (N/A )     Patient location during evaluation: Endoscopy Anesthesia Type: MAC Level of consciousness: awake and alert Pain management: pain level controlled Vital Signs Assessment: post-procedure vital signs reviewed and stable Respiratory status: spontaneous breathing, nonlabored ventilation, respiratory function stable and patient connected to nasal cannula oxygen Cardiovascular status: stable and blood pressure returned to baseline Postop Assessment: no apparent nausea or vomiting Anesthetic complications: no    Last Vitals:  Vitals:   10/18/18 1132 10/18/18 2051  BP: 114/67 101/69  Pulse: 85 87  Resp: 15   Temp: (!) 36.4 C 36.5 C  SpO2: 95% 93%    Last Pain:  Vitals:   10/18/18 2051  TempSrc: Oral  PainSc:                  [name]

## 2018-10-18 NOTE — Transfer of Care (Signed)
Immediate Anesthesia Transfer of Care Note  Patient: Aaron [name]  Procedure(s) Performed: ESOPHAGOGASTRODUODENOSCOPY (EGD) WITH PROPOFOL (N/A )  Patient Location: Endoscopy Unit  Anesthesia Type:MAC  Level of Consciousness: awake  Airway & Oxygen Therapy: Patient Spontanous Breathing and Patient connected to nasal cannula oxygen  Post-op Assessment: Report given to RN and Post -op Vital signs reviewed and stable  Post vital signs: Reviewed and stable  Last Vitals:  Vitals Value Taken Time  BP 86/46 10/18/2018  8:00 AM  Temp    Pulse 68 10/18/2018  8:00 AM  Resp 16 10/18/2018  8:00 AM  SpO2 95 % 10/18/2018  8:00 AM  Vitals shown include unvalidated device data.  Last Pain:  Vitals:   10/18/18 0800  TempSrc: Oral  PainSc:       Patients Stated Pain Goal: 0 (27/78/24 2353)  Complications: No apparent anesthesia complications

## 2018-10-18 NOTE — Interval H&P Note (Signed)
History and Physical Interval Note: 52/male with coffee ground emesis and anemia for an EGD.  10/18/2018 7:28 AM  Aaron [name]  has presented today for EGD, with the diagnosis of hemetamesis.  The various methods of treatment have been discussed with the patient and family. After consideration of risks, benefits and other options for treatment, the patient has consented to  Procedure(s): ESOPHAGOGASTRODUODENOSCOPY (EGD) WITH PROPOFOL (N/A) as a surgical intervention.  The patient's history has been reviewed, patient examined, no change in status, stable for surgery.  I have reviewed the patient's chart and labs.  Questions were answered to the patient's satisfaction.     Kerin Salen

## 2018-10-18 NOTE — Op Note (Signed)
[facility] Patient Name: Aaron [name] Procedure Date : 10/18/2018 MRN: [id number] Attending MD: Kerin Salen , MD Date of Birth: 1966/06/12 CSN: [id number] Age: 53 Admit Type: Inpatient Procedure:                Upper GI endoscopy Indications:              Coffee-ground emesis Providers:                Kerin Salen, MD, Bonney Leitz, Denice Bors., Technician, Dairl Ponder, CRNA Referring MD:              Medicines:                Monitored Anesthesia Care Complications:            No immediate complications. Estimated blood loss:                            None. Estimated Blood Loss:     Estimated blood loss: none. Procedure:                Pre-Anesthesia Assessment:                           - Prior to the procedure, a History and Physical                            was performed, and patient medications and                            allergies were reviewed. The patient's tolerance of                            previous anesthesia was also reviewed. The risks                            and benefits of the procedure and the sedation                            options and risks were discussed with the patient.                            All questions were answered, and informed consent                            was obtained. Prior Anticoagulants: The patient has                            taken Plavix (clopidogrel) and ASA, last dose was 4                            days prior to procedure. ASA Grade Assessment: III                            -  A patient with severe systemic disease. After                            reviewing the risks and benefits, the patient was                            deemed in satisfactory condition to undergo the                            procedure.                           After obtaining informed consent, the endoscope was                            passed under direct vision. Throughout the               procedure, the patient's blood pressure, pulse, and                            oxygen saturations were monitored continuously. The                            GIF-H190 ([id number] Olympus gastroscope was                            introduced through the mouth, and advanced to the                            second part of duodenum. The upper GI endoscopy was                            accomplished without difficulty. The patient                            tolerated the procedure well. Scope In: Scope Out: Findings:      LA Grade C (one or more mucosal breaks continuous between tops of 2 or       more mucosal folds, less than 75% circumference) esophagitis with no       bleeding was found 37 to 40 cm from the incisors.      Few linear and superficial esophageal ulcers with no bleeding and no       stigmata of recent bleeding were found 39 to 40 cm from the incisors.      A few dispersed, small non-bleeding erosions were found in the gastric       body. There were no stigmata of recent bleeding. They appeared to be       related to NG tube induced trauma      Localized moderate mucosal changes characterized by erythema and       nodularity were found in the cardia and in the gastric fundus.      The examined duodenum was normal. Impression:               - LA Grade C esophagitis.                           -  Non-bleeding esophageal ulcers.                           - Non-bleeding erosive gastropathy.                           - Erythematous and nodular mucosa in the cardia and                            gastric fundus.                           - Normal examined duodenum.                           - No specimens collected. Moderate Sedation:      Patient did not receive moderate sedation for this procedure, but       instead received monitored anesthesia care. Recommendation:           - Clear liquid diet, advance as tolerated(patient                            had small and  large bowel dilation on Xray,                            possible ileus- appears to be resolving).                           - Use Protonix (pantoprazole) 40 mg IV BID. Procedure Code(s):        --- Professional ---                           [phone], Esophagogastroduodenoscopy, flexible,                            transoral; diagnostic, including collection of                            specimen(s) by brushing or washing, when performed                            (separate procedure) Diagnosis Code(s):        --- Professional ---                           K20.9, Esophagitis, unspecified                           K22.10, Ulcer of esophagus without bleeding                           K31.89, Other diseases of stomach and duodenum                           K92.0, Hematemesis CPT copyright 2018 American Medical Association. All rights reserved. The codes documented in this report are preliminary and upon coder review may  be  revised to meet current compliance requirements. Kerin Salen, MD 10/18/2018 8:01:46 AM This report has been signed electronically. Number of Addenda: 0

## 2018-10-19 ENCOUNTER — Inpatient Hospital Stay (HOSPITAL_COMMUNITY): Payer: Medicaid Other

## 2018-10-19 ENCOUNTER — Encounter (HOSPITAL_COMMUNITY): Payer: Self-pay | Admitting: Radiology

## 2018-10-19 DIAGNOSIS — D500 Iron deficiency anemia secondary to blood loss (chronic): Secondary | ICD-10-CM

## 2018-10-19 DIAGNOSIS — R748 Abnormal levels of other serum enzymes: Secondary | ICD-10-CM

## 2018-10-19 LAB — CBC
HCT: 28.8 % — ABNORMAL LOW (ref 39.0–52.0)
HEMATOCRIT: 27.6 % — AB (ref 39.0–52.0)
Hemoglobin: 8.6 g/dL — ABNORMAL LOW (ref 13.0–17.0)
Hemoglobin: 8.8 g/dL — ABNORMAL LOW (ref 13.0–17.0)
MCH: 29.1 pg (ref 26.0–34.0)
MCH: 27.9 pg (ref 26.0–34.0)
MCHC: 31.2 g/dL (ref 30.0–36.0)
MCHC: 30.6 g/dL (ref 30.0–36.0)
MCV: 93.2 fL (ref 80.0–100.0)
MCV: 91.4 fL (ref 80.0–100.0)
Platelets: 414 10*3/uL — ABNORMAL HIGH (ref 150–400)
Platelets: 429 K/uL — ABNORMAL HIGH (ref 150–400)
RBC: 2.96 MIL/uL — ABNORMAL LOW (ref 4.22–5.81)
RBC: 3.15 MIL/uL — ABNORMAL LOW (ref 4.22–5.81)
RDW: 17.1 % — AB (ref 11.5–15.5)
RDW: 17.2 % — ABNORMAL HIGH (ref 11.5–15.5)
WBC: 8.6 10*3/uL (ref 4.0–10.5)
WBC: 8.2 K/uL (ref 4.0–10.5)
nRBC: 0 % (ref 0.0–0.2)
nRBC: 0 % (ref 0.0–0.2)

## 2018-10-19 LAB — BASIC METABOLIC PANEL
Anion gap: 9 (ref 5–15)
BUN: 37 mg/dL — AB (ref 6–20)
CO2: 23 mmol/L (ref 22–32)
Calcium: 7.6 mg/dL — ABNORMAL LOW (ref 8.9–10.3)
Chloride: 101 mmol/L (ref 98–111)
Creatinine, Ser: 1.53 mg/dL — ABNORMAL HIGH (ref 0.61–1.24)
GFR calc Af Amer: 59 mL/min — ABNORMAL LOW (ref >60)
GFR calc non Af Amer: 51 mL/min — ABNORMAL LOW (ref >60)
Glucose, Bld: 117 mg/dL — ABNORMAL HIGH (ref 70–99)
POTASSIUM: 3.7 mmol/L (ref 3.5–5.1)
Sodium: 133 mmol/L — ABNORMAL LOW (ref 135–145)

## 2018-10-19 LAB — COOXEMETRY PANEL
Carboxyhemoglobin: 2.3 % — ABNORMAL HIGH (ref 0.5–1.5)
METHEMOGLOBIN: 1 % (ref 0.0–1.5)
O2 Saturation: 74.8 %
TOTAL HEMOGLOBIN: 6.9 g/dL — AB (ref 12.0–16.0)

## 2018-10-19 MED ORDER — POTASSIUM CHLORIDE 10 MEQ/50ML IV SOLN
10.0000 meq | INTRAVENOUS | Status: AC
  Administered 2018-10-19 (×4): 10 meq via INTRAVENOUS
  Filled 2018-10-19 (×4): qty 50

## 2018-10-19 MED ORDER — IOHEXOL 300 MG/ML  SOLN
100.0000 mL | Freq: Once | INTRAMUSCULAR | Status: AC | PRN
  Administered 2018-10-19: 100 mL via INTRAVENOUS

## 2018-10-19 MED ORDER — POTASSIUM CHLORIDE CRYS ER 20 MEQ PO TBCR: 40.0000 meq | EXTENDED_RELEASE_TABLET | Freq: Once | ORAL | Status: DC

## 2018-10-19 MED ORDER — POTASSIUM CHLORIDE 10 MEQ/100ML IV SOLN: 10.0000 meq | INTRAVENOUS | Status: DC

## 2018-10-19 NOTE — Progress Notes (Signed)
[facility] Progress Note  Aaron [name] 53 y.o. [birth year]   Subjective: Bilious vomiting. Nonbloody diarrhea per nursing.  Objective: Vital signs: Vitals:   10/19/18 0448 10/19/18 0848  BP:  (!) 97/59  Pulse: 86 88  Resp:  (!) 23  Temp: (!) 97.5 F (36.4 C)   SpO2: 94% 95%    Physical Exam: Gen: lethargic, chronically ill-appearing, obese, no acute distress  HEENT: anicteric sclera CV: RRR Chest: CTA B Abd: soft, nontender, +distention, +BS Ext: compression stockings bilateral  Lab Results: Recent Labs    10/18/18 0333 10/19/18 0501  NA 130* 133*  K 3.8 3.7  CL 97* 101  CO2 25 23  GLUCOSE 225* 117*  BUN 38* 37*  CREATININE 1.62* 1.53*  CALCIUM 7.6* 7.6*   No results for input(s): AST, ALT, ALKPHOS, BILITOT, PROT, ALBUMIN in the last 72 hours. Recent Labs    10/18/18 1630 10/19/18 0609  WBC 5.4 8.6  HGB 7.9* 8.6*  HCT 25.4* 27.6*  MCV 90.7 93.2  PLT 344 414*      Assessment/Plan: Small esophageal ulcers and mild gastritis - s/p bilious vomiting today and nonbloody diarrhea. Hgb stable at 8.6. Continue clear liquid diet. Supportive care. Will follow.   [name] 10/19/2018, 10:38 AM  Questions please call [name], male   DOB: 02-28-1966, 53 y.o.   MRN: [id number]

## 2018-10-19 NOTE — Progress Notes (Signed)
Advanced Heart Failure Rounding Note   Subjective:    NGT pulled yesterday   EGD 3/22       - LA Grade C esophagitis.                           - Non-bleeding esophageal ulcers.                           - Non-bleeding erosive gastropathy.                           - Erythematous and nodular mucosa in   Has recurrent bilious N/V this am. Not able to hold foods down. Says he has had small soft BM but nothing further. Not passing much gas.   Denies CP or SOB. Remains in AF. Rate controlled on IV amio.    Objective:   Weight Range:  Vital Signs:   Temp:  [97.5 F (36.4 C)-97.7 F (36.5 C)] 97.5 F (36.4 C) (03/23 0448) Pulse Rate:  [86-98] 98 (03/23 1050) Resp:  [23-28] 28 (03/23 1050) BP: (91-106)/(59-75) 91/67 (03/23 1050) SpO2:  [92 %-95 %] 92 % (03/23 1050) Last BM Date: 10/18/18  Weight change: Filed Weights   10/16/18 0533 10/18/18 0535 10/18/18 0716  Weight: 134.4 kg (!) 137.1 kg (!) 137.1 kg   Intake/Output:   Intake/Output Summary (Last 24 hours) at 10/19/2018 1242 Last data filed at 10/19/2018 1100 Gross per 24 hour  Intake 472.16 ml  Output 525 ml  Net -52.84 ml    Physical Exam   General:  Obese male lying in bed. Chronically ill appearing. No resp difficulty HEENT: normal Neck: supple. JVP 7. Carotids 2+ bilat; no bruits. No lymphadenopathy or thryomegaly appreciated. Cor: PMI nondisplaced. Irr egular rate & rhythm. No rubs, gallops or murmurs. Lungs: clear Abdomen: soft, nontender, mildly distended. No hepatosplenomegaly. No bruits or masses. Hypoactive bowel sounds. Extremities: no cyanosis, clubbing, rash, edema RLE brace  RUE PICC Neuro: alert & orientedx3, cranial nerves grossly intact. moves all 4 extremities w/o difficulty. Affect pleasant   Telemetry   Afib 90s, personally reviewed.    Labs    Basic Metabolic Panel: Recent Labs  Lab 10/15/18 0241 10/15/18 0942 10/16/18 0426 10/17/18 0317 10/18/18 0333 10/19/18 0501  NA 131*   --  134* 132* 130* 133*  K 4.0  --  4.6 4.5 3.8 3.7  CL 99  --  99 98 97* 101  CO2 25  --  23 21* 25 23  GLUCOSE 116*  --  108* 105* 225* 117*  BUN 16  --  25* 40* 38* 37*  CREATININE 0.97  --  1.45* 1.96* 1.62* 1.53*  CALCIUM 7.9*  --  8.1* 8.0* 7.6* 7.6*  MG  --  2.0  --   --   --   --    Liver Function Tests: No results for input(s): AST, ALT, ALKPHOS, BILITOT, PROT, ALBUMIN in the last 168 hours. No results for input(s): LIPASE, AMYLASE in the last 168 hours. No results for input(s): AMMONIA in the last 168 hours.  CBC: Recent Labs  Lab 10/15/18 0942  10/17/18 0317 10/17/18 1800 10/18/18 0333 10/18/18 1630 10/19/18 0609  WBC 12.2*   < > 5.0 4.2 4.3 5.4 8.6  NEUTROABS 10.1*  --   --   --   --   --   --  HGB 10.4*   < > 8.4* 7.7* 7.9* 7.9* 8.6*  HCT 31.7*   < > 26.2* 24.8* 24.7* 25.4* 27.6*  MCV 87.8   < > 90.0 90.5 92.2 90.7 93.2  PLT 351   < > 341 299 311 344 414*   < > = values in this interval not displayed.   Cardiac Enzymes: No results for input(s): CKTOTAL, CKMB, CKMBINDEX, TROPONINI in the last 168 hours.  BNP: BNP (last 3 results) Recent Labs    10/01/18 1337 10/01/18 1720  BNP 2,769.2* 2,333.1*   ProBNP (last 3 results) No results for input(s): PROBNP in the last 8760 hours.  Other results:  Imaging: Dg Abd 1 View  Result Date: 10/19/2018 CLINICAL DATA:  Vomiting, abdominal distention. EXAM: ABDOMEN - 1 VIEW COMPARISON:  Radiographs of October 17, 2018. FINDINGS: Stable small bowel dilatation is noted concerning for distal small bowel obstruction or possibly ileus. Possible right colonic dilatation is noted. Distal colon is nondilated. No radio-opaque calculi or other significant radiographic abnormality are seen. IMPRESSION: Small bowel dilatation is noted concerning for distal small bowel obstruction or possibly ileus. Continued radiographic follow-up is recommended. Possible right colonic dilatation is noted. Electronically Signed   By: Lupita Raider, M.D.   On: 10/19/2018 07:26    Medications:    Scheduled Medications:  Chlorhexidine Gluconate Cloth  6 each Topical Daily   digoxin  0.125 mg Intravenous Daily   hydrocortisone  25 mg Rectal BID   mouth rinse  15 mL Mouth Rinse BID   nicotine  21 mg Transdermal Daily   pantoprazole (PROTONIX) IV  40 mg Intravenous Q12H   sodium chloride flush  10-40 mL Intracatheter Q12H   sodium chloride flush  3 mL Intravenous Q12H    Infusions:  sodium chloride     amiodarone 30 mg/hr (10/19/18 1009)   potassium chloride      PRN Medications: sodium chloride, acetaminophen, alum & mag hydroxide-simeth, ondansetron (ZOFRAN) IV, sodium chloride flush, sodium chloride flush, witch hazel-glycerin  Assessment:   Aaron [name] is a 53 y.o. male with h/o of obesity who presented with c/o fluid retention, abdominal swelling, and peripheral edema x 3-4 weeks.  Echo showed new systolic CHF 15%.    CHF team consulted for follow up and treatment. Cath results pending at time of consultation.   Plan/Discussion:    1. Acute systolic CHF -> cardiogenic shock due to iCM - Echo 10/02/18 LVEF 15%, Normal RV, Mod LAE, Mod MR, Mild AI, Mild PI.  - Cath 3/13 showed severe 3v CAD with markedly decompensated hemodynamics - Off milrinone co-ox 75% - Volume status ok. Received IVF due to AKI and NPO status.Now unable to take po again. May need to restart IVF gently.  - Holding diuretics, spiro and losartan with hypotension in setting of R thigh hematoma and UGI bleed.  - Continue digoxin 0.125 mg daily.  - No b-blocker yet with shock - Continue UNNA boots  2. CAD - Severe 3v CAD as above. Has been seen by Dr. Dorris Fetch ([facility]). Not CABG candidate currently given hemodynamics and lack of mobility. Plan was for PCI of LCX and LAD but given large R leg hematoma now plan for medical management,  - No s/s of ischemia.    - Continue ASA and statin. No b-blocker yet with shock. Plavix stopped    3. Afib with RVR, new - Off milrinone. - Back on IV amio at 30 given NPO status and elevated rates. HR improved -  This patients CHA2DS2-VASc Score is at least 3.  - Anticoag on hold now with hematoma and UGI bleeding.   4. Acute large R thigh hematoma with acute blood loss anemia - Heparin off. Ortho has seen -> appreciate their recs  - Has involvement of rectus femoris muscle and femoral nerve.  - Hgb seems to have nadired: 9.1 -> 8.4 -> 7.7-> 7.9-> 8.6. Will continue to follow - Continue SCDs   5.  Acute ileus/SBO with Upper GI bleed - GI following. - EGD results 3/22 noted.  - has recurrent n/v after NGT pulled. KUB suggestive of persistent ileus/SBO. Ab exam not acute.  - Will replace NGT and get CT ab and pelvis. I d/w GI personally   4. AKI - Cr 1.44 on admit. Peaked at 1.96 on 3/21 due to hypotension and ATN. Today 1.53 - Diuretics on hold. Now NPO again may need gentle IVFs  5. Elevated LFTs/shock liver - AST 110 and ALT 268 with total Bili 6.2 on admission. Trended down as of 3/12. No further checks.   - Likely passive congestion/low output CHF.   6. Morbid obesity - Body mass index is 36.79 kg/m.  - Will need sleep study as outpatient and encouraged weight loss.   7. Hypokalemia - K 3.7 today. Will supp  8. Severe deconditioning due to obesity - PT and CIR following. Follow. Now set back by hematoma.    Given multiple acute non-cardiac issues will ask [facility] to resume care (was initially on their service before transferring to [place] after cath). HF team will continue to follow along with help with cardiac issues which are secondary at this point. Appreciate their help in caring for Aaron [name].     Length of Stay: 57  Arvilla Meres, MD 10/19/2018, 12:42 PM  Advanced Heart Failure Team Pager [phone] (M-F; 7a - 4p)  Please contact [facility] for night-coverage after hours (4p -7a ) and weekends on [email or web address]

## 2018-10-19 NOTE — Progress Notes (Addendum)
Pt complains of nausea and Right sided abdominal pain after being moved in bed. Pt had incontinent BM with bath and complete linen changed fapprox 30 min prior to complaint. VS stable and Zofran given for nausea. Of note pt had episode of vomiting earlier in shift after eating a Lemon Icy. Co-ox sent to Respiratory department and [facility] RRT called w/Hgb 6.9. Labs (BMP and CBC ) sent and will await results to confirm. Will continue to monitor. Dierdre Highman, RN   Hgb on CBC 8.6. will continue to monitor. Dierdre Highman

## 2018-10-19 NOTE — Progress Notes (Signed)
Inpatient Rehab Admissions Coordinator:   I met with patient at the bedside.  His NG tube has been replaced for possible bowel obstruction.  Not medically ready at this time.  Will continue to follow for possible rehab admission.    Shann Medal, PT, DPT Admissions Coordinator [phone] 10/19/18  3:32 PM

## 2018-10-19 NOTE — Consult Note (Addendum)
Medical Consultation   Baris Pajak  FQH:[id number]  DOB: 1966/03/30  DOA: 10/01/2018  PCP: [facility] [place]   Requesting physician: Dr. Gala Romney   Reason for consultation: Request transfer of service to hospitalist due to ongoing multiple medical issues  History of Present Illness: Aaron [name] is a 53 y.o. male with significant past medical history other than obesity who previously worked as a Radiation protection practitioner presented [facility] on March 5 with anasarca and 100 pound weight gain.  He also reported abdominal pain/intermittent diarrhea and bloating on admission which he related to?  IBS.  Wife apparently expressed concern about mild confusion at home at the time of admission. In the ED patient was noted to be tachycardic, labs revealed AKI with BUN of 53, creatinine 1.4, his LFTs were slightly elevated in 200s and BNP was elevated at 2769.  CT abdomen/pelvis showed diffuse anasarca, ascites and small bilateral pleural effusions.  Admission EKG showed normal sinus rhythm with T wave inversions in inferolateral leads.  Patient was admitted for further work-up of anasarca. Echocardiogram revealed EF of 15% with diffuse hypokinesis with left atrial moderately dilated, moderate MR and mild AR.  Patient was managed with Coreg and Lasix IV/milrinone drip/Spironolactone.  Cardiology was consulted. Hospital course was complicated by A. fib with RVR requiring heparin drip. Patient subsequently underwent cardiac cath on March 13 which revealed severe three-vessel CAD (Moderate mid LAD disease, tight distal lesion beyond reach of IMA. Severe disease in circumflex. RCA totally occluded). PCI of LAD/LCx versus CABG was considered but cardiothoracic surgery evaluated patient and did not feel he is candidate for CABG, recommended aggressive medical therapy.  Patient was maintained on aspirin, statins and Heparin.  On March 18 patient developed pain in his right thigh and  noticed a hard swelling for which CT lower extremity was obtained revealing a hematoma.  Orthopedics was consulted who recommended nonoperative management of hematoma and felt patient might have resultant temporary femoral nerve paralysis.  Hospital course also complicated by upper GI bleed around the same time prompting GI evaluation.  In view of hypotension/anemia, hematoma and upper GI bleed, heparin was held, PCI deferred and patient underwent EGD on 3/22 revealing small esophageal ulcers, gastritis.  Serial x-rays and CT abdomen suggestive of adynamic ileus/bowel obstruction.  NG tube placed.  Patient also reports diarrhea today.  He denies any melena, hematochezia.  NG drainage does not appear coffee-ground.  He feels better after NG tube placement.  Denies any chest pains and states thigh swelling is much improved. And ongoing multiple medical issues, cardiology service has requested transfer back to medical service.  Review of Systems:  ROS As per HPI otherwise 10 point review of systems negative.     Past Medical History: Past Medical History:  Diagnosis Date   Morbid obesity (HCC)     Past Surgical History: Past Surgical History:  Procedure Laterality Date   RIGHT/LEFT HEART CATH AND CORONARY ANGIOGRAPHY N/A 10/09/2018   Procedure: RIGHT/LEFT HEART CATH AND CORONARY ANGIOGRAPHY;  Surgeon: Iran Ouch, MD;  Location: [facility];  Service: Cardiovascular;  Laterality: N/A;     Allergies:  No Known Allergies   Social History:  reports that he has never smoked. He has never used smokeless tobacco. He reports that he does not drink alcohol or use drugs.   Family History: Family History  Problem Relation Age of Onset  Alcoholism Father    Rheumatic fever Mother    Diabetes Mother    Drug abuse Brother        Physical Exam: Vitals:   10/19/18 0448 10/19/18 0448 10/19/18 0848 10/19/18 1050  BP: 106/75  (!) 97/59 91/67  Pulse:  86 88 98  Resp: (!)  25  (!) 23 (!) 28  Temp:  (!) 97.5 F (36.4 C)    TempSrc:  Oral    SpO2:  94% 95% 92%  Weight:      Height:        Constitutional: Alert and awake, oriented x3, not in any acute distress.  NG tube in place Eyes: PERLA, EOMI, irises appear normal, anicteric sclera,  ENMT: external ears and nose appear normal, normal hearing  Lips appears normal, oropharynx mucosa, tongue, posterior pharynx appear normal  Neck: neck appears normal, no masses, normal ROM, no thyromegaly, no JVD  CVS: S1-S2 heard, irregular, no murmur rubs or gallops, no LE edema, normal pedal pulses  Respiratory:  clear to auscultation bilaterally, no wheezing, rales or rhonchi. Respiratory effort normal. No accessory muscle use.  Abdomen: Obese, soft,  bowel sounds heard, no hepatosplenomegaly, no hernias  Musculoskeletal: No palpable swelling/induration/bruising in the right thigh currently.  Compression dressings?  Unna boots in lower extremities Neuro: Cranial nerves II-XII intact, decreased strength 3/5 in lower extremities Psych: judgement and insight appear normal, stable mood and affect, mental status Skin/Catheters: no rashes or lesions or ulcers noted.  Data reviewed:  I have personally reviewed following labs and imaging studies Labs:  CBC: Recent Labs  Lab 10/15/18 0942  10/17/18 0317 10/17/18 1800 10/18/18 0333 10/18/18 1630 10/19/18 0609  WBC 12.2*   < > 5.0 4.2 4.3 5.4 8.6  NEUTROABS 10.1*  --   --   --   --   --   --   HGB 10.4*   < > 8.4* 7.7* 7.9* 7.9* 8.6*  HCT 31.7*   < > 26.2* 24.8* 24.7* 25.4* 27.6*  MCV 87.8   < > 90.0 90.5 92.2 90.7 93.2  PLT 351   < > 341 299 311 344 414*   < > = values in this interval not displayed.    Basic Metabolic Panel: Recent Labs  Lab 10/15/18 0241 10/15/18 0942 10/16/18 0426 10/17/18 0317 10/18/18 0333 10/19/18 0501  NA 131*  --  134* 132* 130* 133*  K 4.0  --  4.6 4.5 3.8 3.7  CL 99  --  99 98 97* 101  CO2 25  --  23 21* 25 23  GLUCOSE 116*  --   108* 105* 225* 117*  BUN 16  --  25* 40* 38* 37*  CREATININE 0.97  --  1.45* 1.96* 1.62* 1.53*  CALCIUM 7.9*  --  8.1* 8.0* 7.6* 7.6*  MG  --  2.0  --   --   --   --    GFR Estimated Creatinine Clearance: 83.5 mL/min (A) (by C-G formula based on SCr of 1.53 mg/dL (H)). Liver Function Tests: No results for input(s): AST, ALT, ALKPHOS, BILITOT, PROT, ALBUMIN in the last 168 hours. No results for input(s): LIPASE, AMYLASE in the last 168 hours. No results for input(s): AMMONIA in the last 168 hours. Coagulation profile No results for input(s): INR, PROTIME in the last 168 hours. Cardiac Enzymes: No results for input(s): CKTOTAL, CKMB, CKMBINDEX, TROPONINI in the last 168 hours. BNP: Invalid input(s): POCBNP CBG: No results for input(s): GLUCAP in the last 168  hours. D-Dimer No results for input(s): DDIMER in the last 72 hours. Hgb A1c No results for input(s): HGBA1C in the last 72 hours. Lipid Profile No results for input(s): CHOL, HDL, LDLCALC, TRIG, CHOLHDL, LDLDIRECT in the last 72 hours. Thyroid function studies No results for input(s): TSH, T4TOTAL, T3FREE, THYROIDAB in the last 72 hours.  Invalid input(s): FREET3 Anemia work up No results for input(s): VITAMINB12, FOLATE, FERRITIN, TIBC, IRON, RETICCTPCT in the last 72 hours. Urinalysis    Component Value Date/Time   COLORURINE AMBER (A) 10/01/2018 1523   APPEARANCEUR HAZY (A) 10/01/2018 1523   LABSPEC 1.023 10/01/2018 1523   PHURINE 5.0 10/01/2018 1523   GLUCOSEU NEGATIVE 10/01/2018 1523   HGBUR SMALL (A) 10/01/2018 1523   BILIRUBINUR NEGATIVE 10/01/2018 1523   KETONESUR NEGATIVE 10/01/2018 1523   PROTEINUR NEGATIVE 10/01/2018 1523   NITRITE NEGATIVE 10/01/2018 1523   LEUKOCYTESUR NEGATIVE 10/01/2018 1523     Microbiology Recent Results (from the past 240 hour(s))  MRSA PCR Screening     Status: None   Collection Time: 10/10/18  6:18 PM  Result Value Ref Range Status   MRSA by PCR NEGATIVE NEGATIVE Final     Comment:        The GeneXpert MRSA Assay (FDA approved for NASAL specimens only), is one component of a comprehensive MRSA colonization surveillance program. It is not intended to diagnose MRSA infection nor to guide or monitor treatment for MRSA infections. Performed at [facility], [address] [place], [place] [address]        Inpatient Medications:   Scheduled Meds:  Chlorhexidine Gluconate Cloth  6 each Topical Daily   digoxin  0.125 mg Intravenous Daily   hydrocortisone  25 mg Rectal BID   mouth rinse  15 mL Mouth Rinse BID   nicotine  21 mg Transdermal Daily   pantoprazole (PROTONIX) IV  40 mg Intravenous Q12H   sodium chloride flush  10-40 mL Intracatheter Q12H   sodium chloride flush  3 mL Intravenous Q12H   Continuous Infusions:  sodium chloride     amiodarone 30 mg/hr (10/19/18 1009)   potassium chloride       Radiological Exams on Admission: Dg Abd 1 View  Result Date: 10/19/2018 CLINICAL DATA:  Vomiting, abdominal distention. EXAM: ABDOMEN - 1 VIEW COMPARISON:  Radiographs of October 17, 2018. FINDINGS: Stable small bowel dilatation is noted concerning for distal small bowel obstruction or possibly ileus. Possible right colonic dilatation is noted. Distal colon is nondilated. No radio-opaque calculi or other significant radiographic abnormality are seen. IMPRESSION: Small bowel dilatation is noted concerning for distal small bowel obstruction or possibly ileus. Continued radiographic follow-up is recommended. Possible right colonic dilatation is noted. Electronically Signed   By: Lupita Raider, M.D.   On: 10/19/2018 07:26    Impression/Recommendations Principal Problem:   Acute systolic CHF (congestive heart failure) (HCC) Active Problems:   Anasarca   Elevated LFTs   AKI (acute kidney injury) (HCC)   Acute hypoxemic respiratory failure (HCC)   Morbid obesity (HCC)   Atrial fibrillation with rapid ventricular response (HCC)    Shortness of breath   Sinus tachycardia   Tachypnea   Hypokalemia   1.  Ileus/small bowel obstruction: Patient reports diarrhea today.  Likely functional ileus/bowel obstruction.  GI following.  Has NG tube to suction.  2.  Acute blood loss anemia/upper GI bleed: Patient states he had some coffee-ground emesis 2 days back when seen by GI and underwent EGD March  22 showing Grade C esophagitis, nonbleeding esophageal ulcers and nonbleeding erosive gastropathy.  Currently on PPI and off antiplatelet agents/anticoagulants.  Monitor hemoglobin and would transfuse less than 7.5 (upper threshold given cardiac arrhythmia and low EF close ).  Hemoglobin stable and improved at 8.6 today.  3.  Right thigh hematoma: Seen by orthopedics who recommended Bledsoe brace.  PT following.  Clinically improved with no swelling currently.  Hemoglobin stable and improved.  Patient noted to be on compressive wraps on lower extremities (below the knee) but denies any history of leg wounds.   4.  Multivessel CAD: Currently off antiplatelet agents/anticoagulants given concern for problem #2 and problem #3.  Patient however has significant heart disease and understands the risks of continuing blood thinners versus holding.  Would consider enteric-coated aspirin if and when okay with GI/orthopedics and monitor serial hemoglobins.  5.  Acute systolic CHF: Patient has significant cardiomyopathy with EF of 10 to 15%.  Required milrinone drip/IV diuretics in the initial hospital course.  Currently off beta-blockers and diuretics including Aldactone held in view of hypotension.  Not an ACE inhibitors due to problem #6.  On IV digoxin, periodic levels to be done to monitor toxicity in the setting of renal failure.  6.  AKI: Present on admission.  Patient's creatinine was 1.4 on presentation and it did normalize during the hospital course but has been uptrending again in the last 3 days anywhere between 1.4-1.9.  Off diuretics  currently.  Patient has low cardiac output due to low EF and SBP in the 90s but patient asymptomatic.    7.  New onset atrial fibrillation/NSVT: In the setting of left atrial enlargement and low EF on echo.  Cardiology following along.  Patient on amiodarone/digoxin.  Not on anticoagulation due to problem #2 and 3.  Check electrolytes periodically and replace as needed.  8.  Elevated LFTs: Likely secondary to congested liver in the setting of problem #5.  9.  Morbid obesity: Patient diuresed aggressively in the initial hospital course.  Continue to monitor daily weights.  Thank you for this consultation.  Our [facility] hospitalist team will resume care as primary service for this patient and consulting services will follow along.   Time Spent: 60 minutes greater than 50% in review of records and discussion with cardiology APP.   Alessandra Bevels M.D. [facility] 10/19/2018, 12:46 PM

## 2018-10-19 NOTE — Progress Notes (Signed)
Noted that initial portable abdominal xray showed coiled nasogastric tube in the distal esophagus, not in stomach. Recommendation was to pull back tube slightly and re-advance. This was done with Baxter Hire, Consulting civil engineer. Patient tolerated well. Repeat xray ordered to assess tube placement.

## 2018-10-19 NOTE — Progress Notes (Signed)
Physical Therapy Treatment Patient Details Name: Aaron [name] MRN: [id number] DOB: July 25, 1966 Today's Date: 10/19/2018    History of Present Illness Pt is a 53 y.o. male admitted 10/01/18 with progressive fluid retention, abdominal/BLE swelling. Worked up for anasarca and CHF with EF 15%. Transferred to [facility] for cardiac cath 3/13, found to have multivessel CAD; not a candidate for CABG. CT 3/19 with acute R thigh hematoma; planned PCI deferred secondary to this. Pt with positive gastric occult blood; KUB shows ileus. PMH includes obesity.    PT Comments    Patient with ongoing severe diarrhea and vomiting/nausea; RN advises no OOB for now but requests assistance in placing CPM. Focused today's session on patient and RN education on benefits of CPM, process for placing machine, and items to monitor including skin integrity when in machine. Will have PT follow up tomorrow in case nursing or patient has additional questions about applying this machine. He was left in bed in the CPM set at 0-60 and RN present and attending, all needs otherwise met.     Follow Up Recommendations  SNF;Supervision for mobility/OOB     Equipment Recommendations  None recommended by PT    Recommendations for Other Services       Precautions / Restrictions Precautions Precautions: Fall;Other (comment) Precaution Comments: monitor O2 and HR, now needs Bledsoe brace locked in extension R LE when getting up  Required Braces or Orthoses: Other Brace Other Brace: bledsoe R LE when getting up  Restrictions Weight Bearing Restrictions: No RLE Weight Bearing: (as tolerated in brace )    Mobility  Bed Mobility               General bed mobility comments: deferred due to severe nausea and vomiting- education focus today   Transfers                 General transfer comment: deferred due to severe nausea and vomiting- education focus today   Ambulation/Gait             General Gait Details:  deferred due to severe nausea and vomiting- education focus today    Stairs             Wheelchair Mobility    Modified Rankin (Stroke Patients Only)       Balance                                            Cognition Arousal/Alertness: Awake/alert Behavior During Therapy: WFL for tasks assessed/performed Overall Cognitive Status: Within Functional Limits for tasks assessed                                        Exercises      General Comments General comments (skin integrity, edema, etc.): deferred due to severe nausea and vomiting- education focus today       Pertinent Vitals/Pain Pain Assessment: No/denies pain Pain Score: 0-No pain    Home Living                      Prior Function            PT Goals (current goals can now be found in the care plan section) Acute Rehab PT Goals Patient Stated Goal: "Get over this and  work again" PT Goal Formulation: With patient/family Time For Goal Achievement: 10/17/18 Potential to Achieve Goals: Fair Progress towards PT goals: Not progressing toward goals - comment(limited by severe nausea and diarrhea today )    Frequency    Min 3X/week      PT Plan Current plan remains appropriate    Co-evaluation              AM-PAC PT "6 Clicks" Mobility   Outcome Measure  Help needed turning from your back to your side while in a flat bed without using bedrails?: A Little Help needed moving from lying on your back to sitting on the side of a flat bed without using bedrails?: A Little Help needed moving to and from a bed to a chair (including a wheelchair)?: A Little Help needed standing up from a chair using your arms (e.g., wheelchair or bedside chair)?: A Little Help needed to walk in hospital room?: A Little Help needed climbing 3-5 steps with a railing? : A Lot 6 Click Score: 17    End of Session Equipment Utilized During Treatment: Oxygen Activity  Tolerance: Patient tolerated treatment well Patient left: in bed;with call bell/phone within reach;with nursing/sitter in room Nurse Communication: Other (comment)(demonstrated application and settings for CPM ) PT Visit Diagnosis: Muscle weakness (generalized) (M62.81);Difficulty in walking, not elsewhere classified (R26.2);Pain Pain - part of body: (abdomen )     Time: 1410-1420 PT Time Calculation (min) (ACUTE ONLY): 10 min  Charges:  $Self Care/Home Management: 8-22                     Deniece Ree PT, DPT, CBIS  Supplemental Physical Therapist [place]    Pager [phone] Acute Rehab Office [phone]

## 2018-10-19 NOTE — Progress Notes (Signed)
   Called by nursing staff.   Patient unable to take oral potassium due to nausea.   Informed Dr Gala Romney. Discontinue oral potassium. Give 4 runs of IV K now.    Amy Clegg NP-C  12:03 PM

## 2018-10-19 NOTE — Progress Notes (Addendum)
Occupational Therapy Treatment Patient Details Name: Aaron [name] MRN: [id number] DOB: 1966/06/29 Today's Date: 10/19/2018    History of present illness Pt is a 53 y.o. male admitted 10/01/18 with progressive fluid retention, abdominal/BLE swelling. Worked up for anasarca and CHF with EF 15%. Transferred to [facility] for cardiac cath 3/13, found to have multivessel CAD; not a candidate for CABG. CT 3/19 with acute R thigh hematoma; planned PCI deferred secondary to this. Pt with positive gastric occult blood; KUB shows ileus. PMH includes obesity.   OT comments  RN requesting therapy hold off on OOB mobility due to pt nauseous and recently had NG tube placement  Focus of today's session on bilateral UE exercise: reviewing exercises from previous session and providing education on new exercises and improving technique. Encouraged patient to perform horizontal abduction/adduction with UEs positioned closer to 90 degree shoulder flexion if possible.  Will continue to follow acutely in order to maximize safety and independence with ADLs.   Follow Up Recommendations  CIR;Supervision/Assistance - 24 hour    Equipment Recommendations  Other (comment)(defer to next venue)    Recommendations for Other Services PT consult;Rehab consult    Precautions / Restrictions Precautions Precautions: Fall;Other (comment) Precaution Comments: monitor O2 and HR, now needs Bledsoe brace locked in extension R LE when getting up  Required Braces or Orthoses: Other Brace Other Brace: bledsoe R LE when getting up  Restrictions Weight Bearing Restrictions: No RLE Weight Bearing: (as tolerated in brace )       Mobility Bed Mobility Overal bed mobility: Needs Assistance Bed Mobility: Rolling Rolling: Mod assist         General bed mobility comments: Pt rolling to right side with verbal cues for hand placement (reaching for right bedrail with left UE).  Transfers                 General transfer comment:  deferred due to severe nausea and vomiting- education focus today     Balance                                           ADL either performed or assessed with clinical judgement   ADL                                               Vision       Perception     Praxis      Cognition Arousal/Alertness: Awake/alert Behavior During Therapy: WFL for tasks assessed/performed Overall Cognitive Status: Within Functional Limits for tasks assessed                                          Exercises General Exercises - Upper Extremity Shoulder Horizontal ABduction: AROM;Both;10 reps;Theraband;Supine(bed in chair position) Theraband Level (Shoulder Horizontal Abduction): Level 1 (Yellow) Shoulder Horizontal ADduction: AROM;Both;10 reps;Theraband;Supine(bed in chair position) Theraband Level (Shoulder Horizontal Adduction): Level 1 (Yellow) Elbow Flexion: AROM;Both;10 reps;Theraband;Supine(bed in chair position) Theraband Level (Elbow Flexion): Level 1 (Yellow) Elbow Extension: AROM;Both;10 reps;Supine;Theraband(bed in chair position) Theraband Level (Elbow Extension): Level 1 (Yellow)   Shoulder Instructions       General Comments deferred due to severe nausea  and vomiting- education focus today     Pertinent Vitals/ Pain       Pain Assessment: No/denies pain Pain Score: 0-No pain  Home Living                                          Prior Functioning/Environment              Frequency  Min 3X/week        Progress Toward Goals  OT Goals(current goals can now be found in the care plan section)  Progress towards OT goals: Progressing toward goals  Acute Rehab OT Goals Patient Stated Goal: "Get over this and work again" OT Goal Formulation: With patient Time For Goal Achievement: 10/26/18 Potential to Achieve Goals: Good ADL Goals Pt Will Perform Grooming: with min guard assist;standing Pt  Will Perform Upper Body Dressing: with set-up;with supervision;sitting Pt Will Perform Lower Body Dressing: with min guard assist;with adaptive equipment;sit to/from stand Pt Will Transfer to Toilet: with min assist;ambulating;bedside commode Pt Will Perform Toileting - Clothing Manipulation and hygiene: with min guard assist;sitting/lateral leans;sit to/from stand;with adaptive equipment Pt/caregiver will Perform Home Exercise Program: Increased ROM;Increased strength;Both right and left upper extremity;With written HEP provided;Independently Additional ADL Goal #1: Pt will perform bed mobility with supervision in preparation for ADLs  Plan Discharge plan remains appropriate    Co-evaluation                 AM-PAC OT "6 Clicks" Daily Activity     Outcome Measure   Help from another person eating meals?: None Help from another person taking care of personal grooming?: A Little Help from another person toileting, which includes using toliet, bedpan, or urinal?: Total Help from another person bathing (including washing, rinsing, drying)?: A Lot Help from another person to put on and taking off regular upper body clothing?: A Little Help from another person to put on and taking off regular lower body clothing?: A Lot 6 Click Score: 15    End of Session Equipment Utilized During Treatment: (theraband)   OT Visit Diagnosis: Unsteadiness on feet (R26.81);Other abnormalities of gait and mobility (R26.89);Muscle weakness (generalized) (M62.81);Pain   Activity Tolerance Patient tolerated treatment well   Patient Left in bed;with call bell/phone within reach;with nursing/sitter in room   Nurse Communication (CPM removed during bed mobility)        Time: 4158-3094 OT Time Calculation (min): 20 min  Charges: OT General Charges $OT Visit: 1 Visit OT Treatments $Therapeutic Exercise: 8-22 mins     Cipriano Mile OTR/L Acute Rehab PRN 10/19/2018, 4:54 PM

## 2018-10-20 ENCOUNTER — Encounter (HOSPITAL_COMMUNITY): Payer: Self-pay | Admitting: Gastroenterology

## 2018-10-20 LAB — CBC
HCT: 25.9 % — ABNORMAL LOW (ref 39.0–52.0)
HCT: 24.3 % — ABNORMAL LOW (ref 39.0–52.0)
Hemoglobin: 8.1 g/dL — ABNORMAL LOW (ref 13.0–17.0)
Hemoglobin: 7.6 g/dL — ABNORMAL LOW (ref 13.0–17.0)
MCH: 28.9 pg (ref 26.0–34.0)
MCH: 28.4 pg (ref 26.0–34.0)
MCHC: 31.3 g/dL (ref 30.0–36.0)
MCHC: 31.3 g/dL (ref 30.0–36.0)
MCV: 92.5 fL (ref 80.0–100.0)
MCV: 90.7 fL (ref 80.0–100.0)
Platelets: 397 10*3/uL (ref 150–400)
Platelets: 318 10*3/uL (ref 150–400)
RBC: 2.8 MIL/uL — ABNORMAL LOW (ref 4.22–5.81)
RBC: 2.68 MIL/uL — ABNORMAL LOW (ref 4.22–5.81)
RDW: 17.2 % — ABNORMAL HIGH (ref 11.5–15.5)
RDW: 17 % — ABNORMAL HIGH (ref 11.5–15.5)
WBC: 8 10*3/uL (ref 4.0–10.5)
WBC: 9.2 10*3/uL (ref 4.0–10.5)
nRBC: 0 % (ref 0.0–0.2)
nRBC: 0 % (ref 0.0–0.2)

## 2018-10-20 LAB — BASIC METABOLIC PANEL
Anion gap: 10 (ref 5–15)
BUN: 41 mg/dL — AB (ref 6–20)
CO2: 23 mmol/L (ref 22–32)
Calcium: 7.7 mg/dL — ABNORMAL LOW (ref 8.9–10.3)
Chloride: 101 mmol/L (ref 98–111)
Creatinine, Ser: 1.53 mg/dL — ABNORMAL HIGH (ref 0.61–1.24)
GFR calc Af Amer: 59 mL/min — ABNORMAL LOW (ref >60)
GFR calc non Af Amer: 51 mL/min — ABNORMAL LOW (ref >60)
Glucose, Bld: 135 mg/dL — ABNORMAL HIGH (ref 70–99)
Potassium: 3.4 mmol/L — ABNORMAL LOW (ref 3.5–5.1)
Sodium: 134 mmol/L — ABNORMAL LOW (ref 135–145)

## 2018-10-20 LAB — COOXEMETRY PANEL
Carboxyhemoglobin: 2 % — ABNORMAL HIGH (ref 0.5–1.5)
METHEMOGLOBIN: 1.7 % — AB (ref 0.0–1.5)
O2 Saturation: 71.3 %
Total hemoglobin: 8.6 g/dL — ABNORMAL LOW (ref 12.0–16.0)

## 2018-10-20 MED ORDER — KCL IN DEXTROSE-NACL 20-5-0.45 MEQ/L-%-% IV SOLN
INTRAVENOUS | Status: AC
  Administered 2018-10-20: 13:00:00 via INTRAVENOUS
  Filled 2018-10-20: qty 1000

## 2018-10-20 MED ORDER — POTASSIUM CHLORIDE 10 MEQ/50ML IV SOLN
10.0000 meq | INTRAVENOUS | Status: AC
  Administered 2018-10-20 (×4): 10 meq via INTRAVENOUS
  Filled 2018-10-20 (×4): qty 50

## 2018-10-20 NOTE — Progress Notes (Signed)
Orthopedic Tech Progress Note Patient Details:  Aaron [name] 1966/04/13 [id number]  CPM Right Knee CPM Right Knee: On Right Knee Flexion (Degrees): 60 Right Knee Extension (Degrees): 0 Additional Comments: foot roll  Post Interventions Patient Tolerated: Well Instructions Provided: Care of device, Adjustment of device  [name] 10/20/2018, 11:52 AM

## 2018-10-20 NOTE — Progress Notes (Signed)
[facility] Progress Note  Aaron [name] 53 y.o. 1965-10-07   Subjective: Four BMs overnight. Feels better with NG tube in place. Reports feeling abd pain after he was repositioned in the bed.  Objective: Vital signs: Vitals:   10/19/18 2059 10/20/18 0542  BP: 96/70   Pulse: 94 87  Resp: (!) 23 (!) 21  Temp:    SpO2: 97% (!) 88%  No temp recorded in chart.  600 cc from NGT this morning  Physical Exam: Gen: lethargic, chronically ill-appearing, no acute distress  HEENT: anicteric sclera CV: RRR Chest: CTA B Abd: diffuse tenderness with minimal guarding, decreased bowel sounds, mild distention Ext: no edema  Lab Results: Recent Labs    10/19/18 0501 10/20/18 0437  NA 133* 134*  K 3.7 3.4*  CL 101 101  CO2 23 23  GLUCOSE 117* 135*  BUN 37* 41*  CREATININE 1.53* 1.53*  CALCIUM 7.6* 7.7*   No results for input(s): AST, ALT, ALKPHOS, BILITOT, PROT, ALBUMIN in the last 72 hours. Recent Labs    10/19/18 1617 10/20/18 0437  WBC 8.2 8.0  HGB 8.8* 8.1*  HCT 28.8* 25.9*  MCV 91.4 92.5  PLT 429* 397      Assessment/Plan: Distal SBO vs ileus on CT. Clinically better with NGT in place. Moving BMs (4 overnight). Minimal NGT output since 600 cc removed at 0445AM. Hgb 8.1. No signs of bleeding. Continue PPI IV Q 12 hours. Agree with NGT clamping trial tomorrow. NPO. Supportive care. Will follow.   [name] 10/20/2018, 11:47 AM  Questions please call [phone] ID: [name], male   DOB: 08/19/1965, 53 y.o.   MRN: [id number]

## 2018-10-20 NOTE — Progress Notes (Signed)
Orthopedic Tech Progress Note Patient Details:  Aaron [name] [birth year] [id number]  Ortho Devices Type of Ortho Device: Radio broadcast assistant Ortho Device/Splint Location: Bilateral unna boots Ortho Device/Splint Interventions: Application   Post Interventions Patient Tolerated: Well Instructions Provided: Care of device   [name] 10/20/2018, 11:53 AM

## 2018-10-20 NOTE — Progress Notes (Signed)
Physical Therapy Treatment Patient Details Name: Rune Wesby MRN: [id number] DOB: [birth year] Today's Date: 10/20/2018    History of Present Illness Pt is a 53 y.o. male admitted 10/01/18 with progressive fluid retention, abdominal/BLE swelling. Worked up for anasarca and CHF with EF 15%. Transferred to [facility] for cardiac cath 3/13, found to have multivessel CAD; not a candidate for CABG. CT 3/19 with acute R thigh hematoma; planned PCI deferred secondary to this. Pt with positive gastric occult blood; KUB shows ileus. PMH includes obesity.    PT Comments    Pt admitted with above diagnosis. Pt currently with functional limitations due to the deficits listed below (see PT Problem List). Pt was able to exercise bil LEs with assist.  Nurse agreed that pt not to sit EOB or OOB as he has rectal pouch and diarrhea.  Will continue to progress pt as able.  Pt will benefit from skilled PT to increase their independence and safety with mobility to allow discharge to the venue listed below.     Follow Up Recommendations  SNF;Supervision for mobility/OOB     Equipment Recommendations  None recommended by PT    Recommendations for Other Services       Precautions / Restrictions Precautions Precautions: Fall;Other (comment) Precaution Comments: monitor O2 and HR, now needs Bledsoe brace locked in extension R LE when getting up  Required Braces or Orthoses: Other Brace Other Brace: bledsoe R LE when getting up  Restrictions Weight Bearing Restrictions: No RLE Weight Bearing: Weight bearing as tolerated    Mobility  Bed Mobility               General bed mobility comments: Pt declined OOB due to rectal pouch.  Called nursing that also states not to get to EOB or OOB today.   Transfers                    Ambulation/Gait                 Stairs             Wheelchair Mobility    Modified Rankin (Stroke Patients Only)       Balance                                            Cognition Arousal/Alertness: Awake/alert Behavior During Therapy: WFL for tasks assessed/performed Overall Cognitive Status: Within Functional Limits for tasks assessed                                 General Comments: Does not seem to comprehend the importance of mobility with regards to his health condition; states he want to get better and move however when it comes down to it, always stating excuses re nausea, pain, lack of sleep, food etc       Exercises General Exercises - Upper Extremity Shoulder Flexion: AROM;Both;10 reps;Theraband(bed in chair position) Theraband Level (Shoulder Flexion): Level 1 (Yellow) Shoulder Horizontal ABduction: AROM;Both;10 reps;Theraband;Supine(bed in chair position) Theraband Level (Shoulder Horizontal Abduction): Level 1 (Yellow) Shoulder Horizontal ADduction: AROM;Both;10 reps;Theraband;Supine(bed in chair position) Theraband Level (Shoulder Horizontal Adduction): Level 1 (Yellow) Elbow Flexion: AROM;Both;10 reps;Theraband;Supine(bed in chair position) Theraband Level (Elbow Flexion): Level 1 (Yellow) Elbow Extension: AROM;Both;10 reps;Supine;Theraband(bed in chair position) Theraband Level (Elbow Extension): Level 1 (Yellow) General  Exercises - Lower Extremity Ankle Circles/Pumps: AROM;Both;Supine;20 reps Quad Sets: AROM;Both;Supine;15 reps(with glut set as well ) Heel Slides: AAROM;Both;Supine;15 reps Hip ABduction/ADduction: AAROM;10 reps;Supine;Both Straight Leg Raises: AAROM;Both;Supine;10 reps    General Comments        Pertinent Vitals/Pain Pain Assessment: No/denies pain    Home Living                      Prior Function            PT Goals (current goals can now be found in the care plan section) Acute Rehab PT Goals Patient Stated Goal: "Get over this and work again" PT Goal Formulation: With patient/family Time For Goal Achievement: 11/03/18 Potential to Achieve  Goals: Fair Progress towards PT goals: Not progressing toward goals - comment(limited by rectal pouch)    Frequency    Min 3X/week      PT Plan Current plan remains appropriate    Co-evaluation              AM-PAC PT "6 Clicks" Mobility   Outcome Measure  Help needed turning from your back to your side while in a flat bed without using bedrails?: A Little Help needed moving from lying on your back to sitting on the side of a flat bed without using bedrails?: A Little Help needed moving to and from a bed to a chair (including a wheelchair)?: A Little Help needed standing up from a chair using your arms (e.g., wheelchair or bedside chair)?: A Little Help needed to walk in hospital room?: A Little Help needed climbing 3-5 steps with a railing? : A Lot 6 Click Score: 17    End of Session Equipment Utilized During Treatment: Oxygen Activity Tolerance: Patient tolerated treatment well Patient left: in bed;with call bell/phone within reach(bed in chair position)   PT Visit Diagnosis: Muscle weakness (generalized) (M62.81);Difficulty in walking, not elsewhere classified (R26.2);Pain Pain - part of body: (abdomen )     Time: 8882-8003 PT Time Calculation (min) (ACUTE ONLY): 16 min  Charges:  $Therapeutic Exercise: 8-22 mins                     Fayette Gasner,PT Acute Rehabilitation Services Pager:  [phone]  Office:  [phone]     Berline Lopes 10/20/2018, 11:47 AM

## 2018-10-20 NOTE — Progress Notes (Signed)
Advanced Heart Failure Rounding Note   Subjective:    EGD 3/22       - LA Grade C esophagitis.                           - Non-bleeding esophageal ulcers.                           - Non-bleeding erosive gastropathy.                           - Erythematous and nodular mucosa in    4 large liquid BMs overnight.  Belly feels less distended. NGT remains in. Now with rectal tube.   CT 3/23 with dilated SB loops. ? Focal ileus versus early SBO. No transition point   Feels weak. Denies CP, SOB, orthopnea or PND. Co-ox 71%  Objective:   Weight Range:  Vital Signs:   Pulse Rate:  [87-98] 87 (03/24 0542) Resp:  [21-28] 21 (03/24 0542) BP: (91-96)/(67-70) 96/70 (03/23 2059) SpO2:  [88 %-97 %] 88 % (03/24 0542) Last BM Date: 10/19/18  Weight change: Filed Weights   10/16/18 0533 10/18/18 0535 10/18/18 0716  Weight: 134.4 kg (!) 137.1 kg (!) 137.1 kg   Intake/Output:   Intake/Output Summary (Last 24 hours) at 10/20/2018 0954 Last data filed at 10/20/2018 0500 Gross per 24 hour  Intake 526.57 ml  Output 1775 ml  Net -1248.43 ml    Physical Exam   General:  Obese male lying in bed  Weak appearing No resp difficulty HEENT: normal x for NGT Neck: supple. no JVD. Carotids 2+ bilat; no bruits. No lymphadenopathy or thryomegaly appreciated. Cor: PMI nondisplaced. Regular rate & rhythm. No rubs, gallops or murmurs. Lungs: clear Abdomen: Obese soft, nontender, nondistended. No hepatosplenomegaly. No bruits or masses. Good bowel sounds. Extremities: no cyanosis, clubbing, rash, edema + brace on RLE RUE PICC  Neuro: alert & orientedx3, cranial nerves grossly intact. moves all 4 extremities w/o difficulty. Affect pleasant   Telemetry   Afib 80-90s, personally reviewed.    Labs    Basic Metabolic Panel: Recent Labs  Lab 10/15/18 0942 10/16/18 0426 10/17/18 0317 10/18/18 0333 10/19/18 0501 10/20/18 0437  NA  --  134* 132* 130* 133* 134*  K  --  4.6 4.5 3.8 3.7 3.4*    CL  --  99 98 97* 101 101  CO2  --  23 21* 25 23 23   GLUCOSE  --  108* 105* 225* 117* 135*  BUN  --  25* 40* 38* 37* 41*  CREATININE  --  1.45* 1.96* 1.62* 1.53* 1.53*  CALCIUM  --  8.1* 8.0* 7.6* 7.6* 7.7*  MG 2.0  --   --   --   --   --    Liver Function Tests: No results for input(s): AST, ALT, ALKPHOS, BILITOT, PROT, ALBUMIN in the last 168 hours. No results for input(s): LIPASE, AMYLASE in the last 168 hours. No results for input(s): AMMONIA in the last 168 hours.  CBC: Recent Labs  Lab 10/15/18 0942  10/18/18 0333 10/18/18 1630 10/19/18 0609 10/19/18 1617 10/20/18 0437  WBC 12.2*   < > 4.3 5.4 8.6 8.2 8.0  NEUTROABS 10.1*  --   --   --   --   --   --   HGB 10.4*   < > 7.9* 7.9*  8.6* 8.8* 8.1*  HCT 31.7*   < > 24.7* 25.4* 27.6* 28.8* 25.9*  MCV 87.8   < > 92.2 90.7 93.2 91.4 92.5  PLT 351   < > 311 344 414* 429* 397   < > = values in this interval not displayed.   Cardiac Enzymes: No results for input(s): CKTOTAL, CKMB, CKMBINDEX, TROPONINI in the last 168 hours.  BNP: BNP (last 3 results) Recent Labs    10/01/18 1337 10/01/18 1720  BNP 2,769.2* 2,333.1*   ProBNP (last 3 results) No results for input(s): PROBNP in the last 8760 hours.  Other results:  Imaging: Dg Abd 1 View  Result Date: 10/19/2018 CLINICAL DATA:  Vomiting, abdominal distention. EXAM: ABDOMEN - 1 VIEW COMPARISON:  Radiographs of October 17, 2018. FINDINGS: Stable small bowel dilatation is noted concerning for distal small bowel obstruction or possibly ileus. Possible right colonic dilatation is noted. Distal colon is nondilated. No radio-opaque calculi or other significant radiographic abnormality are seen. IMPRESSION: Small bowel dilatation is noted concerning for distal small bowel obstruction or possibly ileus. Continued radiographic follow-up is recommended. Possible right colonic dilatation is noted. Electronically Signed   By: Lupita Raider, M.D.   On: 10/19/2018 07:26   Ct Abdomen  Pelvis W Contrast  Result Date: 10/19/2018 CLINICAL DATA:  Small bowel obstruction. EXAM: CT ABDOMEN AND PELVIS WITH CONTRAST TECHNIQUE: Multidetector CT imaging of the abdomen and pelvis was performed using the standard protocol following bolus administration of intravenous contrast. CONTRAST:  OMNIPAQUE IOHEXOL 300 MG/ML  SOLN COMPARISON:  CT scan of October 01, 2018. FINDINGS: Lower chest: Moderate right pleural effusion is noted with adjacent right lower lobe atelectasis. Hepatobiliary: No focal liver abnormality is seen. No gallstones, gallbladder wall thickening, or biliary dilatation. Pancreas: Unremarkable. No pancreatic ductal dilatation or surrounding inflammatory changes. Spleen: Normal in size without focal abnormality. Adrenals/Urinary Tract: Adrenal glands appear normal. Large right renal cyst is noted. No hydronephrosis or renal obstruction is noted. No renal or ureteral calculi are noted. Urinary bladder is unremarkable. Stomach/Bowel: Stomach appears normal. Proximal small bowel dilatation is noted concerning for distal small bowel obstruction or possibly ileus. No definite transition zone is noted. The appendix is not visualized, although no inflammation is noted in the right lower quadrant. Mild wall thickening of the rectum is noted suggesting possible inflammation. Vascular/Lymphatic: Aortic atherosclerosis. No enlarged abdominal or pelvic lymph nodes. Reproductive: Prostate is unremarkable. Other: No abdominal wall hernia or abnormality. No abdominopelvic ascites. Musculoskeletal: No acute or significant osseous findings. IMPRESSION: Proximal small bowel dilatation is noted concerning for distal small bowel obstruction or possibly ileus. No definite focal transition zone is noted. Mild wall thickening of the rectum is noted suggesting possible inflammation. Moderate right pleural effusion with adjacent right lower lobe subsegmental atelectasis. Aortic Atherosclerosis (ICD10-I70.0).  Electronically Signed   By: Lupita Raider, M.D.   On: 10/19/2018 13:24   Dg Abd Portable 1v  Result Date: 10/19/2018 CLINICAL DATA:  Nasogastric tube repositioning EXAM: PORTABLE ABDOMEN - 1 VIEW COMPARISON:  October 19, 2018 study obtained earlier in the day FINDINGS: Nasogastric tube tip and side port in stomach. There are loops of dilated bowel consistent with either ileus or a degree of obstruction. No free air. There is consolidation in the left base. IMPRESSION: Nasogastric tube tip and side port in stomach. Loops of dilated bowel persist. No free air. Consolidation left lung base. Electronically Signed   By: Bretta Bang III M.D.   On: 10/19/2018 19:59  Dg Abd Portable 1v  Result Date: 10/19/2018 CLINICAL DATA:  Repositioning of nasogastric tube EXAM: PORTABLE ABDOMEN - 1 VIEW COMPARISON:  Portable exam 1644 hours compared to 1526 hours FINDINGS: Nasogastric tube coiled in distal esophagus. Air-filled distended loops of large and small bowel in the upper abdomen. Opacified LEFT lung base. Bones demineralized. IMPRESSION: Nasogastric tube remains coiled at the distal esophagus. Electronically Signed   By: Ulyses Southward M.D.   On: 10/19/2018 17:14   Dg Abd Portable 1v  Result Date: 10/19/2018 CLINICAL DATA:  Check gastric catheter placement EXAM: PORTABLE ABDOMEN - 1 VIEW COMPARISON:  None. FINDINGS: Scattered large and small bowel gas is noted. Gastric catheter is noted coiled upon itself in the distal esophagus. It does not appear to enter the stomach. This should be withdrawn slightly and readvanced. IMPRESSION: Gastric catheter appears coiled upon itself within the distal esophagus. Electronically Signed   By: Alcide Clever M.D.   On: 10/19/2018 15:46    Medications:    Scheduled Medications:  digoxin  0.125 mg Intravenous Daily   hydrocortisone  25 mg Rectal BID   mouth rinse  15 mL Mouth Rinse BID   nicotine  21 mg Transdermal Daily   pantoprazole (PROTONIX) IV  40 mg  Intravenous Q12H   sodium chloride flush  10-40 mL Intracatheter Q12H   sodium chloride flush  3 mL Intravenous Q12H    Infusions:  sodium chloride     amiodarone 30 mg/hr (10/20/18 0735)   potassium chloride 10 mEq (10/20/18 0954)    PRN Medications: sodium chloride, acetaminophen, alum & mag hydroxide-simeth, ondansetron (ZOFRAN) IV, sodium chloride flush, sodium chloride flush, witch hazel-glycerin  Assessment:   Fraser Busche is a 53 y.o. male with h/o of obesity who presented with c/o fluid retention, abdominal swelling, and peripheral edema x 3-4 weeks.  Echo showed new systolic CHF 15%.    CHF team consulted for follow up and treatment. Cath results pending at time of consultation.   Plan/Discussion:    1. Acute systolic CHF -> cardiogenic shock due to iCM - Echo 10/02/18 LVEF 15%, Normal RV, Mod LAE, Mod MR, Mild AI, Mild PI.  - Cath 3/13 showed severe 3v CAD with markedly decompensated hemodynamics - Off milrinone co-ox 71% - Volume status ok. Received IVF due to AKI and NPO status.Now unable to take po again. May need to restart IVF gently. Weight and renal function stable today.  - Holding diuretics, spiro and losartan with hypotension in setting of R thigh hematoma and UGI bleed.  - Continue digoxin 0.125 mg daily.  - No b-blocker yet with shock - Continue UNNA boots  2. CAD - Severe 3v CAD as above. Has been seen by Dr. Dorris Fetch ([facility]). Not CABG candidate currently given hemodynamics and lack of mobility. Plan was for PCI of LCX and LAD but given large R leg hematoma now plan for medical management,  - No s/s of ischemia despite physiologic stressors.  - Continue ASA and statin. No b-blocker yet with shock. Plavix stopped   3. Afib with RVR, new - Off milrinone. - Back on IV amio at 30 given NPO status and elevated rates. HR improved - This patients CHA2DS2-VASc Score is at least 3.  - Anticoag on hold now with hematoma and UGI bleeding.   4. Acute  large R thigh hematoma with acute blood loss anemia - Heparin off. Ortho has seen -> appreciate their recs  - Has involvement of rectus femoris muscle and femoral nerve.  -  Brace applied. - Hgb stable . Will continue to follow - Continue SCDs   5.  Acute ileus/SBO with Upper GI bleed - GI amd TRH managing (thank you) - EGD results 3/22 and CT scan 3/23 results  noted.  - suspect ileus in setting of systemic illness. Seems to be improving   4. AKI - Cr 1.44 on admit. Peaked at 1.96 on 3/21 due to hypotension and ATN. Today 1.53 - Diuretics on hold. Now NPO again may need gentle IVFs  5. Hypokalemia - K 3.4 today. Being supped   6. Severe deconditioning due to obesity - PT and CIR following.     Length of Stay: 52  Arvilla Meres, MD 10/20/2018, 9:54 AM  Advanced Heart Failure Team Pager [phone] (M-F; 7a - 4p)  Please contact [facility] for night-coverage after hours (4p -7a ) and weekends on [email or web address]

## 2018-10-20 NOTE — Progress Notes (Signed)
Pt has had 4 LARGE liquid BM's this shift. Bilat Una boots had to be removed due to being soiled. Rectal pouch applied to attempt to contain stool. NG tube remains in place-600cc emptied for my shift. Will continue to monitor. Dierdre Highman, RN

## 2018-10-20 NOTE — Progress Notes (Signed)
PROGRESS NOTE    Aaron [name]  RUE:[id number] DOB: [birth year] DOA: 10/01/2018 PCP: [facility] [place]     Brief Narrative:  Aaron [name] is a 53 y.o. male with significant past medical history other than obesity who previously worked as a Radiation protection practitioner presented to [facility] on March 5 with anasarca and 100 pound weight gain.  He also reported abdominal pain/intermittent diarrhea and bloating on admission which he related to?  IBS.  Wife apparently expressed concern about mild confusion at home at the time of admission. In the ED patient was noted to be tachycardic, labs revealed AKI with BUN of 53, creatinine 1.4, his LFTs were slightly elevated in 200s and BNP was elevated at 2769.  CT abdomen/pelvis showed diffuse anasarca, ascites and small bilateral pleural effusions.  Admission EKG showed normal sinus rhythm with T wave inversions in inferolateral leads.  Patient was admitted for further work-up of anasarca. Echocardiogram revealed EF of 15% with diffuse hypokinesis with left atrial moderately dilated, moderate MR and mild AR.  Patient was managed with Coreg and Lasix IV/milrinone drip/Spironolactone.  Cardiology was consulted. Hospital course was complicated by A. fib with RVR requiring heparin drip. Patient subsequently underwent cardiac cath on March 13 which revealed severe three-vessel CAD (Moderate mid LAD disease, tight distal lesion beyond reach of IMA. Severe disease in circumflex. RCA totally occluded). PCI of LAD/LCx versus CABG was considered but cardiothoracic surgery evaluated patient and did not feel he is candidate for CABG, recommended aggressive medical therapy.  Patient was maintained on aspirin, statins and Heparin.  On March 18 patient developed pain in his right thigh and noticed a hard swelling for which CT lower extremity was obtained revealing a hematoma.  Orthopedics was consulted who recommended nonoperative management of hematoma and felt patient  might have resultant temporary femoral nerve paralysis.  Hospital course also complicated by upper GI bleed around the same time prompting GI evaluation.  In view of hypotension/anemia, hematoma and upper GI bleed, heparin was held, PCI deferred and patient underwent EGD on 3/22 revealing small esophageal ulcers, gastritis.  Serial x-rays and CT abdomen suggestive of adynamic ileus/bowel obstruction.  NG tube placed. Patient transferred back to [facility] due to ongoing multiple medical conditions.   New events last 24 hours / Subjective: Had numerous bowel movements early this morning, states that his abdomen is feeling much better.  NG tube in place.  Denies any nausea, vomiting.  No chest pain or shortness of breath.  Continues to be very weak overall, excited to work with physical therapy later today.  Assessment & Plan:   Principal Problem:   Acute systolic CHF (congestive heart failure) (HCC) Active Problems:   Anasarca   Elevated LFTs   AKI (acute kidney injury) (HCC)   Acute hypoxemic respiratory failure (HCC)   Morbid obesity (HCC)   Atrial fibrillation with rapid ventricular response (HCC)   Shortness of breath   Sinus tachycardia   Tachypnea   Hypokalemia   Ileus/small bowel obstruction: Continue NGT. Had BM this morning. NGT clamp trial tomorrow if continues to improve symptomatically.   Acute blood loss anemia/upper GI bleed: S/p EGD 3/22 showing grade C esophagitis, nonbleeding esophageal ulcers and nonbleeding erosive gastropathy.  Currently on PPI and off antiplatelet agents/anticoagulants.  Monitor hemoglobin and would transfuse less than 7.5 (upper threshold given cardiac arrhythmia and low EF close ).  Hemoglobin stable at 8.1 today.  Right thigh hematoma: Seen by orthopedics who recommended Bledsoe brace.  PT following.  Clinically improved with no swelling currently.  Hemoglobin stable and improved.  Patient noted to be on compressive wraps on lower extremities (below the  knee) but denies any history of leg wounds.   Multivessel CAD: Currently off antiplatelet agents/anticoagulants given GI bleed and right thigh hematoma.  Patient however has significant heart disease and understands the risks of continuing blood thinners versus holding.  Would consider enteric-coated aspirin if and when okay with GI/orthopedics and monitor serial hemoglobins.  Acute systolic CHF: Patient has significant cardiomyopathy with EF of 10 to 15%.  Required milrinone drip/IV diuretics in the initial hospital course.  Currently off beta-blockers and diuretics including aldactone held in view of hypotension.  Not an ACE inhibitors due to AKI.  On IV digoxin, periodic levels to be done to monitor toxicity in the setting of renal failure.  AKI: Present on admission.  Patient's creatinine was 1.4 on presentation and it did normalize during the hospital course but has been uptrending again in the last 3 days anywhere between 1.4-1.9.  Off diuretics currently.   New onset atrial fibrillation/NSVT: In the setting of left atrial enlargement and low EF on echo.  Cardiology following along.  Patient on amiodarone/digoxin.  Not on anticoagulation.   Elevated LFTs: Likely secondary to congested liver   Morbid obesity: Body mass index is 36.79 kg/m.   Hypokalemia: Replace, trend   Tobacco abuse: Nicotine patch    DVT prophylaxis: SCD Code Status: Full Family Communication: Spoke with wife over the phone at patient's request Disposition Plan: CIR    Consultants:   Cardiology/advanced heart failure team  Cardiothoracic surgery  Orthopedic surgery  GI  Antimicrobials:  Anti-infectives (From admission, onward)   Start     Dose/Rate Route Frequency Ordered Stop   10/01/18 1515  cefTRIAXone (ROCEPHIN) 1 g in sodium chloride 0.9 % 100 mL IVPB     1 g 200 mL/hr over 30 Minutes Intravenous  Once 10/01/18 1502 10/01/18 2311   10/01/18 1515  azithromycin (ZITHROMAX) tablet 500 mg      500 mg Oral  Once 10/01/18 1502 10/01/18 1510        Objective: Vitals:   10/19/18 0848 10/19/18 1050 10/19/18 2059 10/20/18 0542  BP: (!) 97/59 91/67 96/70    Pulse: 88 98 94 87  Resp: (!) 23 (!) 28 (!) 23 (!) 21  Temp:      TempSrc:      SpO2: 95% 92% 97% (!) 88%  Weight:      Height:        Intake/Output Summary (Last 24 hours) at 10/20/2018 1048 Last data filed at 10/20/2018 0500 Gross per 24 hour  Intake 526.57 ml  Output 1675 ml  Net -1148.43 ml   Filed Weights   10/16/18 0533 10/18/18 0535 10/18/18 0716  Weight: 134.4 kg (!) 137.1 kg (!) 137.1 kg    Examination:  General exam: Appears calm and comfortable  Respiratory system: Clear to auscultation. Respiratory effort normal. Cardiovascular system: S1 & S2 heard, Irreg rhythm. No JVD, murmurs, rubs, gallops or clicks. No pitting pedal edema. Gastrointestinal system: Abdomen is nondistended, soft and nontender. No organomegaly or masses felt. Hypoactive bowel sounds, NGT with bilious fluid  Central nervous system: Alert and oriented. No focal neurological deficits. Extremities: Symmetric  Skin: No rashes, lesions or ulcers Psychiatry: Judgement and insight appear normal. Mood & affect appropriate.   Data Reviewed: I have personally reviewed following labs and imaging studies  CBC: Recent Labs  Lab 10/15/18 0942  10/18/18 [phone]  10/18/18 1630 10/19/18 0609 10/19/18 1617 10/20/18 0437  WBC 12.2*   < > 4.3 5.4 8.6 8.2 8.0  NEUTROABS 10.1*  --   --   --   --   --   --   HGB 10.4*   < > 7.9* 7.9* 8.6* 8.8* 8.1*  HCT 31.7*   < > 24.7* 25.4* 27.6* 28.8* 25.9*  MCV 87.8   < > 92.2 90.7 93.2 91.4 92.5  PLT 351   < > 311 344 414* 429* 397   < > = values in this interval not displayed.   Basic Metabolic Panel: Recent Labs  Lab 10/15/18 0942 10/16/18 0426 10/17/18 0317 10/18/18 0333 10/19/18 0501 10/20/18 0437  NA  --  134* 132* 130* 133* 134*  K  --  4.6 4.5 3.8 3.7 3.4*  CL  --  99 98 97* 101 101  CO2  --   23 21* 25 23 23   GLUCOSE  --  108* 105* 225* 117* 135*  BUN  --  25* 40* 38* 37* 41*  CREATININE  --  1.45* 1.96* 1.62* 1.53* 1.53*  CALCIUM  --  8.1* 8.0* 7.6* 7.6* 7.7*  MG 2.0  --   --   --   --   --    GFR: Estimated Creatinine Clearance: 83.5 mL/min (A) (by C-G formula based on SCr of 1.53 mg/dL (H)). Liver Function Tests: No results for input(s): AST, ALT, ALKPHOS, BILITOT, PROT, ALBUMIN in the last 168 hours. No results for input(s): LIPASE, AMYLASE in the last 168 hours. No results for input(s): AMMONIA in the last 168 hours. Coagulation Profile: No results for input(s): INR, PROTIME in the last 168 hours. Cardiac Enzymes: No results for input(s): CKTOTAL, CKMB, CKMBINDEX, TROPONINI in the last 168 hours. BNP (last 3 results) No results for input(s): PROBNP in the last 8760 hours. HbA1C: No results for input(s): HGBA1C in the last 72 hours. CBG: No results for input(s): GLUCAP in the last 168 hours. Lipid Profile: No results for input(s): CHOL, HDL, LDLCALC, TRIG, CHOLHDL, LDLDIRECT in the last 72 hours. Thyroid Function Tests: No results for input(s): TSH, T4TOTAL, FREET4, T3FREE, THYROIDAB in the last 72 hours. Anemia Panel: No results for input(s): VITAMINB12, FOLATE, FERRITIN, TIBC, IRON, RETICCTPCT in the last 72 hours. Sepsis Labs: No results for input(s): PROCALCITON, LATICACIDVEN in the last 168 hours.  Recent Results (from the past 240 hour(s))  MRSA PCR Screening     Status: None   Collection Time: 10/10/18  6:18 PM  Result Value Ref Range Status   MRSA by PCR NEGATIVE NEGATIVE Final    Comment:        The GeneXpert MRSA Assay (FDA approved for NASAL specimens only), is one component of a comprehensive MRSA colonization surveillance program. It is not intended to diagnose MRSA infection nor to guide or monitor treatment for MRSA infections. Performed at [facility], [address] [place], [place] [address]        Radiology Studies: Dg  Abd 1 View  Result Date: 10/19/2018 CLINICAL DATA:  Vomiting, abdominal distention. EXAM: ABDOMEN - 1 VIEW COMPARISON:  Radiographs of October 17, 2018. FINDINGS: Stable small bowel dilatation is noted concerning for distal small bowel obstruction or possibly ileus. Possible right colonic dilatation is noted. Distal colon is nondilated. No radio-opaque calculi or other significant radiographic abnormality are seen. IMPRESSION: Small bowel dilatation is noted concerning for distal small bowel obstruction or possibly ileus. Continued radiographic follow-up is recommended. Possible  right colonic dilatation is noted. Electronically Signed   By: Lupita Raider, M.D.   On: 10/19/2018 07:26   Ct Abdomen Pelvis W Contrast  Result Date: 10/19/2018 CLINICAL DATA:  Small bowel obstruction. EXAM: CT ABDOMEN AND PELVIS WITH CONTRAST TECHNIQUE: Multidetector CT imaging of the abdomen and pelvis was performed using the standard protocol following bolus administration of intravenous contrast. CONTRAST:  OMNIPAQUE IOHEXOL 300 MG/ML  SOLN COMPARISON:  CT scan of October 01, 2018. FINDINGS: Lower chest: Moderate right pleural effusion is noted with adjacent right lower lobe atelectasis. Hepatobiliary: No focal liver abnormality is seen. No gallstones, gallbladder wall thickening, or biliary dilatation. Pancreas: Unremarkable. No pancreatic ductal dilatation or surrounding inflammatory changes. Spleen: Normal in size without focal abnormality. Adrenals/Urinary Tract: Adrenal glands appear normal. Large right renal cyst is noted. No hydronephrosis or renal obstruction is noted. No renal or ureteral calculi are noted. Urinary bladder is unremarkable. Stomach/Bowel: Stomach appears normal. Proximal small bowel dilatation is noted concerning for distal small bowel obstruction or possibly ileus. No definite transition zone is noted. The appendix is not visualized, although no inflammation is noted in the right lower quadrant. Mild  wall thickening of the rectum is noted suggesting possible inflammation. Vascular/Lymphatic: Aortic atherosclerosis. No enlarged abdominal or pelvic lymph nodes. Reproductive: Prostate is unremarkable. Other: No abdominal wall hernia or abnormality. No abdominopelvic ascites. Musculoskeletal: No acute or significant osseous findings. IMPRESSION: Proximal small bowel dilatation is noted concerning for distal small bowel obstruction or possibly ileus. No definite focal transition zone is noted. Mild wall thickening of the rectum is noted suggesting possible inflammation. Moderate right pleural effusion with adjacent right lower lobe subsegmental atelectasis. Aortic Atherosclerosis (ICD10-I70.0). Electronically Signed   By: Lupita Raider, M.D.   On: 10/19/2018 13:24   Dg Abd Portable 1v  Result Date: 10/19/2018 CLINICAL DATA:  Nasogastric tube repositioning EXAM: PORTABLE ABDOMEN - 1 VIEW COMPARISON:  October 19, 2018 study obtained earlier in the day FINDINGS: Nasogastric tube tip and side port in stomach. There are loops of dilated bowel consistent with either ileus or a degree of obstruction. No free air. There is consolidation in the left base. IMPRESSION: Nasogastric tube tip and side port in stomach. Loops of dilated bowel persist. No free air. Consolidation left lung base. Electronically Signed   By: Bretta Bang III M.D.   On: 10/19/2018 19:59   Dg Abd Portable 1v  Result Date: 10/19/2018 CLINICAL DATA:  Repositioning of nasogastric tube EXAM: PORTABLE ABDOMEN - 1 VIEW COMPARISON:  Portable exam 1644 hours compared to 1526 hours FINDINGS: Nasogastric tube coiled in distal esophagus. Air-filled distended loops of large and small bowel in the upper abdomen. Opacified LEFT lung base. Bones demineralized. IMPRESSION: Nasogastric tube remains coiled at the distal esophagus. Electronically Signed   By: Ulyses Southward M.D.   On: 10/19/2018 17:14   Dg Abd Portable 1v  Result Date: 10/19/2018 CLINICAL  DATA:  Check gastric catheter placement EXAM: PORTABLE ABDOMEN - 1 VIEW COMPARISON:  None. FINDINGS: Scattered large and small bowel gas is noted. Gastric catheter is noted coiled upon itself in the distal esophagus. It does not appear to enter the stomach. This should be withdrawn slightly and readvanced. IMPRESSION: Gastric catheter appears coiled upon itself within the distal esophagus. Electronically Signed   By: Alcide Clever M.D.   On: 10/19/2018 15:46      Scheduled Meds:  digoxin  0.125 mg Intravenous Daily   hydrocortisone  25 mg Rectal BID  mouth rinse  15 mL Mouth Rinse BID   nicotine  21 mg Transdermal Daily   pantoprazole (PROTONIX) IV  40 mg Intravenous Q12H   sodium chloride flush  10-40 mL Intracatheter Q12H   sodium chloride flush  3 mL Intravenous Q12H   Continuous Infusions:  sodium chloride     amiodarone 30 mg/hr (10/20/18 0735)   potassium chloride 10 mEq (10/20/18 1047)     LOS: 19 days    Time spent: 45 minutes   Noralee Stain, DO [facility] [email or web address] 10/20/2018, 10:48 AM

## 2018-10-20 NOTE — Progress Notes (Signed)
Orthopedic Tech Progress Note Patient Details:  Aaron [name] [birth year] [id number]  CPM Right Knee CPM Right Knee: Off Right Knee Flexion (Degrees): 60 Right Knee Extension (Degrees): 0 Additional Comments: foot roll  Post Interventions Patient Tolerated: Well Instructions Provided: Care of device  [name] 10/20/2018, 3:10 PM

## 2018-10-20 NOTE — Progress Notes (Signed)
Occupational Therapy Treatment Patient Details Name: Aaron [name] MRN: [id number] DOB: 1965-11-21 Today's Date: 10/20/2018    History of present illness Pt is a 53 y.o. male admitted 10/01/18 with progressive fluid retention, abdominal/BLE swelling. Worked up for anasarca and CHF with EF 15%. Transferred to [facility] for cardiac cath 3/13, found to have multivessel CAD; not a candidate for CABG. CT 3/19 with acute R thigh hematoma; planned PCI deferred secondary to this. Pt with positive gastric occult blood; KUB shows ileus. PMH includes obesity.   OT comments  RN requesting hold off on OOB mobility or sitting EOB due to rectal pouch.  Therapist reviewed bilateral UE exercises with pt and added new exercise for shoulder flexion.  Pt positioned in chair position in bed. Therapist discussed importance of chair position and encouraged pt to actively participate in bed mobility and bathing with nursing staff. Will continue to follow acutely and will attempt OOB mobility if possible next session.   Follow Up Recommendations  CIR;Supervision/Assistance - 24 hour    Equipment Recommendations  Other (comment)(defer to next venue)    Recommendations for Other Services PT consult;Rehab consult    Precautions / Restrictions Precautions Precautions: Fall;Other (comment) Precaution Comments: monitor O2 and HR, now needs Bledsoe brace locked in extension R LE when getting up  Required Braces or Orthoses: Other Brace Other Brace: bledsoe R LE when getting up        Mobility Bed Mobility                  Transfers                      Balance                                           ADL either performed or assessed with clinical judgement   ADL                                               Vision       Perception     Praxis      Cognition Arousal/Alertness: Awake/alert Behavior During Therapy: WFL for tasks assessed/performed Overall  Cognitive Status: Within Functional Limits for tasks assessed                                          Exercises Exercises: General Upper Extremity General Exercises - Upper Extremity Shoulder Flexion: AROM;Both;10 reps;Theraband(bed in chair position) Theraband Level (Shoulder Flexion): Level 1 (Yellow) Shoulder Horizontal ABduction: AROM;Both;10 reps;Theraband;Supine(bed in chair position) Theraband Level (Shoulder Horizontal Abduction): Level 1 (Yellow) Shoulder Horizontal ADduction: AROM;Both;10 reps;Theraband;Supine(bed in chair position) Theraband Level (Shoulder Horizontal Adduction): Level 1 (Yellow) Elbow Flexion: AROM;Both;10 reps;Theraband;Supine(bed in chair position) Theraband Level (Elbow Flexion): Level 1 (Yellow) Elbow Extension: AROM;Both;10 reps;Supine;Theraband(bed in chair position) Theraband Level (Elbow Extension): Level 1 (Yellow)   Shoulder Instructions       General Comments      Pertinent Vitals/ Pain       Pain Assessment: No/denies pain  Home Living  Prior Functioning/Environment              Frequency  Min 3X/week        Progress Toward Goals  OT Goals(current goals can now be found in the care plan section)  Progress towards OT goals: Progressing toward goals  Acute Rehab OT Goals Patient Stated Goal: "Get over this and work again" OT Goal Formulation: With patient Time For Goal Achievement: 10/26/18 Potential to Achieve Goals: Good ADL Goals Pt Will Perform Grooming: with min guard assist;standing Pt Will Perform Upper Body Dressing: with set-up;with supervision;sitting Pt Will Perform Lower Body Dressing: with min guard assist;with adaptive equipment;sit to/from stand Pt Will Transfer to Toilet: with min assist;ambulating;bedside commode Pt Will Perform Toileting - Clothing Manipulation and hygiene: with min guard assist;sitting/lateral leans;sit to/from  stand;with adaptive equipment Pt/caregiver will Perform Home Exercise Program: Increased ROM;Increased strength;Both right and left upper extremity;With written HEP provided;Independently Additional ADL Goal #1: Pt will perform bed mobility with supervision in preparation for ADLs  Plan Discharge plan remains appropriate    Co-evaluation                 AM-PAC OT "6 Clicks" Daily Activity     Outcome Measure   Help from another person eating meals?: None Help from another person taking care of personal grooming?: A Little Help from another person toileting, which includes using toliet, bedpan, or urinal?: Total Help from another person bathing (including washing, rinsing, drying)?: A Lot Help from another person to put on and taking off regular upper body clothing?: A Little Help from another person to put on and taking off regular lower body clothing?: A Lot 6 Click Score: 15    End of Session Equipment Utilized During Treatment: (theraband)  OT Visit Diagnosis: Unsteadiness on feet (R26.81);Other abnormalities of gait and mobility (R26.89);Muscle weakness (generalized) (M62.81);Pain   Activity Tolerance Patient tolerated treatment well   Patient Left in bed;with call bell/phone within reach   Nurse Communication (will attempt OOB mobility tomorrow )        Time: 2025-4270 OT Time Calculation (min): 18 min  Charges: OT General Charges $OT Visit: 1 Visit OT Treatments $Therapeutic Exercise: 8-22 mins     Cipriano Mile OTR/L Acute Rehab PRN 10/20/2018, 9:55 AM

## 2018-10-21 LAB — BASIC METABOLIC PANEL
Anion gap: 6 (ref 5–15)
BUN: 34 mg/dL — ABNORMAL HIGH (ref 6–20)
CALCIUM: 7.3 mg/dL — AB (ref 8.9–10.3)
CO2: 25 mmol/L (ref 22–32)
CREATININE: 1.21 mg/dL (ref 0.61–1.24)
Chloride: 103 mmol/L (ref 98–111)
GFR calc Af Amer: >60 mL/min (ref >60)
GFR calc non Af Amer: >60 mL/min (ref >60)
Glucose, Bld: 110 mg/dL — ABNORMAL HIGH (ref 70–99)
Potassium: 3.2 mmol/L — ABNORMAL LOW (ref 3.5–5.1)
SODIUM: 134 mmol/L — AB (ref 135–145)

## 2018-10-21 LAB — COOXEMETRY PANEL
Carboxyhemoglobin: 1.9 % — ABNORMAL HIGH (ref 0.5–1.5)
Carboxyhemoglobin: 1.9 % — ABNORMAL HIGH (ref 0.5–1.5)
Methemoglobin: 1.5 % (ref 0.0–1.5)
Methemoglobin: 1.6 % — ABNORMAL HIGH (ref 0.0–1.5)
O2 Saturation: 63.9 %
O2 Saturation: 64.9 %
Total hemoglobin: 6.2 g/dL — CL (ref 12.0–16.0)
Total hemoglobin: 8.5 g/dL — ABNORMAL LOW (ref 12.0–16.0)

## 2018-10-21 LAB — CBC
HCT: 24.5 % — ABNORMAL LOW (ref 39.0–52.0)
HCT: 25.2 % — ABNORMAL LOW (ref 39.0–52.0)
Hemoglobin: 7.3 g/dL — ABNORMAL LOW (ref 13.0–17.0)
Hemoglobin: 8 g/dL — ABNORMAL LOW (ref 13.0–17.0)
MCH: 27.8 pg (ref 26.0–34.0)
MCH: 29.4 pg (ref 26.0–34.0)
MCHC: 29.8 g/dL — ABNORMAL LOW (ref 30.0–36.0)
MCHC: 31.7 g/dL (ref 30.0–36.0)
MCV: 92.6 fL (ref 80.0–100.0)
MCV: 93.2 fL (ref 80.0–100.0)
NRBC: 0 % (ref 0.0–0.2)
NRBC: 0 % (ref 0.0–0.2)
PLATELETS: 315 10*3/uL (ref 150–400)
Platelets: 339 10*3/uL (ref 150–400)
RBC: 2.63 MIL/uL — ABNORMAL LOW (ref 4.22–5.81)
RBC: 2.72 MIL/uL — ABNORMAL LOW (ref 4.22–5.81)
RDW: 17.1 % — ABNORMAL HIGH (ref 11.5–15.5)
RDW: 17.2 % — ABNORMAL HIGH (ref 11.5–15.5)
WBC: 10.9 10*3/uL — ABNORMAL HIGH (ref 4.0–10.5)
WBC: 12.5 10*3/uL — ABNORMAL HIGH (ref 4.0–10.5)

## 2018-10-21 LAB — DIGOXIN LEVEL: Digoxin Level: 0.3 ng/mL — ABNORMAL LOW (ref 0.8–2.0)

## 2018-10-21 MED ORDER — SPIRONOLACTONE 12.5 MG HALF TABLET: 12.5000 mg | ORAL_TABLET | Freq: Every day | ORAL | Status: DC

## 2018-10-21 MED ORDER — POTASSIUM CHLORIDE CRYS ER 20 MEQ PO TBCR: 40.0000 meq | EXTENDED_RELEASE_TABLET | ORAL | Status: DC

## 2018-10-21 MED ORDER — POTASSIUM CHLORIDE 10 MEQ/50ML IV SOLN
10.0000 meq | INTRAVENOUS | Status: AC
  Administered 2018-10-21 (×6): 10 meq via INTRAVENOUS
  Filled 2018-10-21 (×6): qty 50

## 2018-10-21 NOTE — Progress Notes (Addendum)
Physical Therapy Treatment Patient Details Name: Aaron [name] MRN: [id number] DOB: 12/13/1965 Today's Date: 10/21/2018    History of Present Illness Pt is a 53 y.o. male admitted 10/01/18 with progressive fluid retention, abdominal/BLE swelling. Worked up for anasarca and CHF with EF 15%. [facility]red [facility] cardiac cath 3/13, found to have multivessel CAD; not a candidate for CABG. CT 3/19 with acute R thigh hematoma; planned PCI deferred secondary to this. Pt with positive gastric occult blood; KUB shows ileus. NG tube in place.  PMH includes obesity.    PT Comments    Pt admitted with above diagnosis. Pt currently with functional limitations due to the deficits listed below (see PT Problem List). Pt was able to sit EOB x 10 min with min guard to mod assist with posterior lean at times. Pt needed cues and assist and was able to perform LE exercise but could not stand even with +2 total assist.  Will  Continue to progress pt as able.   Pt met 1/5 goals due to multiple medical issues.  Revised goals. Pt will benefit from skilled PT to increase their independence and safety with mobility to allow discharge to the venue listed below.     Follow Up Recommendations  SNF;Supervision for mobility/OOB     Equipment Recommendations  None recommended by PT    Recommendations for Other Services       Precautions / Restrictions Precautions Precautions: Fall;Other (comment) Precaution Comments: monitor O2 and HR, now needs Bledsoe brace locked in extension R LE when getting up  Required Braces or Orthoses: Other Brace Other Brace: bledsoe R LE when getting up  Restrictions Weight Bearing Restrictions: No RLE Weight Bearing: Weight bearing as tolerated    Mobility  Bed Mobility Overal bed mobility: Needs Assistance Bed Mobility: Rolling Rolling: Mod assist   Supine to sit: HOB elevated;Mod assist;+2 for physical assistance Sit to supine: Mod assist;Max assist;+2 for physical assistance    General bed mobility comments: Needed mod assist for mobility.  Mod assist to roll due to weight of LES.  Mod assist for supine to sit as needed to help LEs and use pad to decr shear of the rectal pouch.  Also needed elevation of the trunk.   Transfers Overall transfer level: Needs assistance Equipment used: Rolling walker (2 wheeled) Transfers: Sit to/from Stand Sit to Stand: From elevated surface;Max assist;Total assist;+2 physical assistance         General transfer comment: Pt could not clear bottom off bed without max to total assist with Bledsoe brace in place.  Attempted x 3.  Pt became dizzy after the attempts therefore laid pt back down.   Ambulation/Gait                 Stairs             Wheelchair Mobility    Modified Rankin (Stroke Patients Only)       Balance Overall balance assessment: Needs assistance Sitting-balance support: Feet supported;Bilateral upper extremity supported Sitting balance-Leahy Scale: Poor Sitting balance - Comments: Pt at times min guard assist but at times mod assist due to posterior lean. Pt sat a total of 10 min EOB and performed LE exercise with left LE.    Standing balance support: Bilateral upper extremity supported Standing balance-Leahy Scale: Zero Standing balance comment: could not come to standing with +2 max to total assist.                               Cognition Arousal/Alertness: Awake/alert Behavior During Therapy: WFL for tasks assessed/performed Overall Cognitive Status: Within Functional Limits for tasks assessed                                        Exercises General Exercises - Lower Extremity Ankle Circles/Pumps: AROM;Both;Supine;20 reps Long Arc Quad: Seated;AROM;10 reps;Left Heel Slides: AAROM;Both;Supine;5 reps    General Comments        Pertinent Vitals/Pain Pain Assessment: No/denies pain    Home Living                      Prior Function             PT Goals (current goals can now be found in the care plan section) Acute Rehab PT Goals Patient Stated Goal: "Get over this and work again" Progress towards PT goals: Progressing toward goals    Frequency    Min 3X/week      PT Plan Current plan remains appropriate    Co-evaluation              AM-PAC PT "6 Clicks" Mobility   Outcome Measure  Help needed turning from your back to your side while in a flat bed without using bedrails?: A Lot Help needed moving from lying on your back to sitting on the side of a flat bed without using bedrails?: A Lot Help needed moving to and from a bed to a chair (including a wheelchair)?: Total Help needed standing up from a chair using your arms (e.g., wheelchair or bedside chair)?: Total Help needed to walk in hospital room?: Total Help needed climbing 3-5 steps with a railing? : Total 6 Click Score: 8    End of Session Equipment Utilized During Treatment: Oxygen;Gait belt(4LO2 with sats 88-92%) Activity Tolerance: Patient tolerated treatment well Patient left: in bed;with call bell/phone within reach;with bed alarm set(bed in chair position) Nurse Communication: Mobility status PT Visit Diagnosis: Muscle weakness (generalized) (M62.81);Difficulty in walking, not elsewhere classified (R26.2);Pain Pain - part of body: (abdomen )     Time: 0923-1002 PT Time Calculation (min) (ACUTE ONLY): 39 min  Charges:  $Therapeutic Exercise: 8-22 mins $Therapeutic Activity: 8-22 mins                      ,PT Acute Rehabilitation Services Pager:  [phone]  Office:  [phone]      F  10/21/2018, 10:29 AM   

## 2018-10-21 NOTE — Progress Notes (Addendum)
[facility] Progress Note  Shamaar Kempson 53 y.o. [birth year]   Subjective: Feels ok with NGT clamped. Diarrhea overnight.  Objective: Vital signs: Vitals:   10/21/18 0517 10/21/18 0853  BP: 101/65 97/77  Pulse: 88   Resp: 14 19  Temp:    SpO2: 92% 96%    Physical Exam: Gen: lethargic, chronically ill-appearing, no acute distress  HEENT: anicteric sclera CV: RRR Chest: CTA B Abd: diffuse tenderness with minimal guarding, distended, high-pitched bowel sounds Ext: compression bandages on LE  Lab Results: Recent Labs    10/20/18 0437 10/21/18 0532  NA 134* 134*  K 3.4* 3.2*  CL 101 103  CO2 23 25  GLUCOSE 135* 110*  BUN 41* 34*  CREATININE 1.53* 1.21  CALCIUM 7.7* 7.3*   No results for input(s): AST, ALT, ALKPHOS, BILITOT, PROT, ALBUMIN in the last 72 hours. Recent Labs    10/21/18 0532 10/21/18 0623  WBC 10.9* 12.5*  HGB 7.3* 8.0*  HCT 24.5* 25.2*  MCV 93.2 92.6  PLT 315 339      Assessment/Plan: Suspected ileus that is resolving with BMs occurring and tolerating NGT clamp trial. Would d/c NG tube tomorrow if tolerates trials today. Continue PPI IV Q 12 hours. NPO. F/U on tomorrow's Xray. Supportive care.   [name] 10/21/2018, 11:15 AM  Questions please call [phone] ID: Graviel Livingston, male   DOB: Oct 23, 1965, 53 y.o.   MRN: [id number]

## 2018-10-21 NOTE — Progress Notes (Signed)
PROGRESS NOTE    Aaron [name]  IOX:[id number] DOB: 24-Feb-1966 DOA: 10/01/2018 PCP: [facility] [place]     Brief Narrative:  Aaron [name] is a 53 y.o. male with significant past medical history other than obesity who previously worked as a Radiation protection practitioner [facility] on March 5 with anasarca and 100 pound weight gain.  He also reported abdominal pain/intermittent diarrhea and bloating on admission which he related to?  IBS.  Wife apparently expressed concern about mild confusion at home at the time of admission. In the ED patient was noted to be tachycardic, labs revealed AKI with BUN of 53, creatinine 1.4, his LFTs were slightly elevated in 200s and BNP was elevated at 2769.  CT abdomen/pelvis showed diffuse anasarca, ascites and small bilateral pleural effusions.  Admission EKG showed normal sinus rhythm with T wave inversions in inferolateral leads.  Patient was admitted for further work-up of anasarca. Echocardiogram revealed EF of 15% with diffuse hypokinesis with left atrial moderately dilated, moderate MR and mild AR.  Patient was managed with Coreg and Lasix IV/milrinone drip/Spironolactone.  Cardiology was consulted. Hospital course was complicated by A. fib with RVR requiring heparin drip. Patient subsequently underwent cardiac cath on March 13 which revealed severe three-vessel CAD (Moderate mid LAD disease, tight distal lesion beyond reach of IMA. Severe disease in circumflex. RCA totally occluded). PCI of LAD/LCx versus CABG was considered but cardiothoracic surgery evaluated patient and did not feel he is candidate for CABG, recommended aggressive medical therapy.  Patient was maintained on aspirin, statins and Heparin.  On March 18 patient developed pain in his right thigh and noticed a hard swelling for which CT lower extremity was obtained revealing a hematoma.  Orthopedics was consulted who recommended nonoperative management of hematoma and felt patient  might have resultant temporary femoral nerve paralysis.  Hospital course also complicated by upper GI bleed around the same time prompting GI evaluation.  In view of hypotension/anemia, hematoma and upper GI bleed, heparin was held, PCI deferred and patient underwent EGD on 3/22 revealing small esophageal ulcers, gastritis.  Serial x-rays and CT abdomen suggestive of adynamic ileus/bowel obstruction.  NG tube placed. Patient transferred back to [facility] due to ongoing multiple medical conditions.   New events last 24 hours / Subjective: Feeling well today, no complaints. Had 1 BM yesterday. No nausea, vomiting, abdominal pain. NGT last 24 hours.   Assessment & Plan:   Principal Problem:   Acute systolic CHF (congestive heart failure) (HCC) Active Problems:   Anasarca   Elevated LFTs   AKI (acute kidney injury) (HCC)   Acute hypoxemic respiratory failure (HCC)   Morbid obesity (HCC)   Atrial fibrillation with rapid ventricular response (HCC)   Shortness of breath   Sinus tachycardia   Tachypnea   Hypokalemia   Ileus/small bowel obstruction: Having BM. NGT clamp trial today.   Acute blood loss anemia/upper GI bleed: S/p EGD 3/22 showing grade C esophagitis, nonbleeding esophageal ulcers and nonbleeding erosive gastropathy.  Currently on PPI and off antiplatelet agents/anticoagulants.  Monitor hemoglobin and would transfuse less than 7.5 (upper threshold given cardiac arrhythmia and low EF close ).  Hemoglobin stable at 8.0 today.  Right thigh hematoma: Seen by orthopedics who recommended Bledsoe brace.  PT following.  Clinically improved with no swelling currently.  Hemoglobin stable and improved.  Patient noted to be on compressive wraps on lower extremities (below the knee) but denies any history of leg wounds.   Multivessel CAD:  Currently off antiplatelet agents/anticoagulants given GI bleed and right thigh hematoma.  Patient however has significant heart disease and understands the  risks of continuing blood thinners versus holding.  Would consider enteric-coated aspirin if and when okay with GI/orthopedics and monitor serial hemoglobins.  Acute systolic CHF: Patient has significant cardiomyopathy with EF of 10 to 15%.  Required milrinone drip/IV diuretics in the initial hospital course.  Currently off beta-blockers and diuretics including aldactone held in view of hypotension.  Not an ACE inhibitors due to AKI.  On IV digoxin, periodic levels to be done to monitor toxicity in the setting of renal failure.  AKI: Present on admission.  Resolved now. Off diuretics currently.   New onset atrial fibrillation/NSVT: In the setting of left atrial enlargement and low EF on echo.  Cardiology following along.  Patient on amiodarone/digoxin.  Not on anticoagulation.   Elevated LFTs: Likely secondary to congested liver   Morbid obesity: Body mass index is 35.31 kg/m.   Hypokalemia: Replace, trend   Tobacco abuse: Nicotine patch    DVT prophylaxis: SCD Code Status: Full Family Communication: Spoke with wife over the phone 3/25  Disposition Plan: CIR    Consultants:   Cardiology/advanced heart failure team  Cardiothoracic surgery  Orthopedic surgery  GI  Antimicrobials:  Anti-infectives (From admission, onward)   Start     Dose/Rate Route Frequency Ordered Stop   10/01/18 1515  cefTRIAXone (ROCEPHIN) 1 g in sodium chloride 0.9 % 100 mL IVPB     1 g 200 mL/hr over 30 Minutes Intravenous  Once 10/01/18 1502 10/01/18 2311   10/01/18 1515  azithromycin (ZITHROMAX) tablet 500 mg     500 mg Oral  Once 10/01/18 1502 10/01/18 1510       Objective: Vitals:   10/20/18 1449 10/20/18 2205 10/21/18 0517 10/21/18 0853  BP: 102/63 101/61 101/65 97/77  Pulse: 87 80 88   Resp: Temp: 98.3 F (36.8 C) 98.1 F (36.7 C)    TempSrc: Oral Oral    SpO2: 96% 96% 92% 96%  Weight:   131.6 kg   Height:        Intake/Output Summary (Last 24 hours) at  10/21/2018 1115 Last data filed at 10/21/2018 0905 Gross per 24 hour  Intake 1081.41 ml  Output 1250 ml  Net -168.59 ml   Filed Weights   10/18/18 0535 10/18/18 0716 10/21/18 0517  Weight: (!) 137.1 kg (!) 137.1 kg 131.6 kg    Examination: General exam: Appears calm and comfortable  Respiratory system: Clear to auscultation. Respiratory effort normal. Cardiovascular system: S1 & S2 heard, irreg rhythm Gastrointestinal system: Abdomen is nondistended, soft and nontender. No organomegaly or masses felt. Normal bowel sounds heard. +NGT  Central nervous system: Alert and oriented. No focal neurological deficits. Extremities: Symmetric 5 x 5 power. Skin: No rashes, lesions or ulcers Psychiatry: Judgement and insight appear normal. Mood & affect appropriate.    Data Reviewed: I have personally reviewed following labs and imaging studies  CBC: Recent Labs  Lab 10/15/18 0942  10/19/18 1617 10/20/18 0437 10/20/18 1600 10/21/18 0532 10/21/18 0623  WBC 12.2*   < > 8.2 8.0 9.2 10.9* 12.5*  NEUTROABS 10.1*  --   --   --   --   --   --   HGB 10.4*   < > 8.8* 8.1* 7.6* 7.3* 8.0*  HCT 31.7*   < > 28.8* 25.9* 24.3* 24.5* 25.2*  MCV 87.8   < > 91.4  92.5 90.7 93.2 92.6  PLT 351   < > 429* 397 318 315 339   < > = values in this interval not displayed.   Basic Metabolic Panel: Recent Labs  Lab 10/15/18 0942  10/17/18 0317 10/18/18 0333 10/19/18 0501 10/20/18 0437 10/21/18 0532  NA  --    < > 132* 130* 133* 134* 134*  K  --    < > 4.5 3.8 3.7 3.4* 3.2*  CL  --    < > 98 97* 101 101 103  CO2  --    < > 21* 25 23 23 25   GLUCOSE  --    < > 105* 225* 117* 135* 110*  BUN  --    < > 40* 38* 37* 41* 34*  CREATININE  --    < > 1.96* 1.62* 1.53* 1.53* 1.21  CALCIUM  --    < > 8.0* 7.6* 7.6* 7.7* 7.3*  MG 2.0  --   --   --   --   --   --    < > = values in this interval not displayed.   GFR: Estimated Creatinine Clearance: 103.4 mL/min (by C-G formula based on SCr of 1.21 mg/dL). Liver  Function Tests: No results for input(s): AST, ALT, ALKPHOS, BILITOT, PROT, ALBUMIN in the last 168 hours. No results for input(s): LIPASE, AMYLASE in the last 168 hours. No results for input(s): AMMONIA in the last 168 hours. Coagulation Profile: No results for input(s): INR, PROTIME in the last 168 hours. Cardiac Enzymes: No results for input(s): CKTOTAL, CKMB, CKMBINDEX, TROPONINI in the last 168 hours. BNP (last 3 results) No results for input(s): PROBNP in the last 8760 hours. HbA1C: No results for input(s): HGBA1C in the last 72 hours. CBG: No results for input(s): GLUCAP in the last 168 hours. Lipid Profile: No results for input(s): CHOL, HDL, LDLCALC, TRIG, CHOLHDL, LDLDIRECT in the last 72 hours. Thyroid Function Tests: No results for input(s): TSH, T4TOTAL, FREET4, T3FREE, THYROIDAB in the last 72 hours. Anemia Panel: No results for input(s): VITAMINB12, FOLATE, FERRITIN, TIBC, IRON, RETICCTPCT in the last 72 hours. Sepsis Labs: No results for input(s): PROCALCITON, LATICACIDVEN in the last 168 hours.  No results found for this or any previous visit (from the past 240 hour(s)).     Radiology Studies: Ct Abdomen Pelvis W Contrast  Result Date: 10/19/2018 CLINICAL DATA:  Small bowel obstruction. EXAM: CT ABDOMEN AND PELVIS WITH CONTRAST TECHNIQUE: Multidetector CT imaging of the abdomen and pelvis was performed using the standard protocol following bolus administration of intravenous contrast. CONTRAST:  OMNIPAQUE IOHEXOL 300 MG/ML  SOLN COMPARISON:  CT scan of October 01, 2018. FINDINGS: Lower chest: Moderate right pleural effusion is noted with adjacent right lower lobe atelectasis. Hepatobiliary: No focal liver abnormality is seen. No gallstones, gallbladder wall thickening, or biliary dilatation. Pancreas: Unremarkable. No pancreatic ductal dilatation or surrounding inflammatory changes. Spleen: Normal in size without focal abnormality. Adrenals/Urinary Tract: Adrenal  glands appear normal. Large right renal cyst is noted. No hydronephrosis or renal obstruction is noted. No renal or ureteral calculi are noted. Urinary bladder is unremarkable. Stomach/Bowel: Stomach appears normal. Proximal small bowel dilatation is noted concerning for distal small bowel obstruction or possibly ileus. No definite transition zone is noted. The appendix is not visualized, although no inflammation is noted in the right lower quadrant. Mild wall thickening of the rectum is noted suggesting possible inflammation. Vascular/Lymphatic: Aortic atherosclerosis. No enlarged abdominal or pelvic lymph nodes. Reproductive: Prostate  is unremarkable. Other: No abdominal wall hernia or abnormality. No abdominopelvic ascites. Musculoskeletal: No acute or significant osseous findings. IMPRESSION: Proximal small bowel dilatation is noted concerning for distal small bowel obstruction or possibly ileus. No definite focal transition zone is noted. Mild wall thickening of the rectum is noted suggesting possible inflammation. Moderate right pleural effusion with adjacent right lower lobe subsegmental atelectasis. Aortic Atherosclerosis (ICD10-I70.0). Electronically Signed   By: Lupita Raider, M.D.   On: 10/19/2018 13:24   Dg Abd Portable 1v  Result Date: 10/19/2018 CLINICAL DATA:  Nasogastric tube repositioning EXAM: PORTABLE ABDOMEN - 1 VIEW COMPARISON:  October 19, 2018 study obtained earlier in the day FINDINGS: Nasogastric tube tip and side port in stomach. There are loops of dilated bowel consistent with either ileus or a degree of obstruction. No free air. There is consolidation in the left base. IMPRESSION: Nasogastric tube tip and side port in stomach. Loops of dilated bowel persist. No free air. Consolidation left lung base. Electronically Signed   By: Bretta Bang III M.D.   On: 10/19/2018 19:59   Dg Abd Portable 1v  Result Date: 10/19/2018 CLINICAL DATA:  Repositioning of nasogastric tube EXAM:  PORTABLE ABDOMEN - 1 VIEW COMPARISON:  Portable exam 1644 hours compared to 1526 hours FINDINGS: Nasogastric tube coiled in distal esophagus. Air-filled distended loops of large and small bowel in the upper abdomen. Opacified LEFT lung base. Bones demineralized. IMPRESSION: Nasogastric tube remains coiled at the distal esophagus. Electronically Signed   By: Ulyses Southward M.D.   On: 10/19/2018 17:14   Dg Abd Portable 1v  Result Date: 10/19/2018 CLINICAL DATA:  Check gastric catheter placement EXAM: PORTABLE ABDOMEN - 1 VIEW COMPARISON:  None. FINDINGS: Scattered large and small bowel gas is noted. Gastric catheter is noted coiled upon itself in the distal esophagus. It does not appear to enter the stomach. This should be withdrawn slightly and readvanced. IMPRESSION: Gastric catheter appears coiled upon itself within the distal esophagus. Electronically Signed   By: Alcide Clever M.D.   On: 10/19/2018 15:46      Scheduled Meds:  digoxin  0.125 mg Intravenous Daily   hydrocortisone  25 mg Rectal BID   mouth rinse  15 mL Mouth Rinse BID   nicotine  21 mg Transdermal Daily   pantoprazole (PROTONIX) IV  40 mg Intravenous Q12H   sodium chloride flush  10-40 mL Intracatheter Q12H   sodium chloride flush  3 mL Intravenous Q12H   Continuous Infusions:  sodium chloride     amiodarone 30 mg/hr (10/21/18 0831)   potassium chloride 10 mEq (10/21/18 1032)     LOS: 20 days    Time spent: 25 minutes   Noralee Stain, DO [facility] [email or web address] 10/21/2018, 11:15 AM

## 2018-10-21 NOTE — Progress Notes (Addendum)
Advanced Heart Failure Rounding Note   Subjective:    EGD 3/22       - LA Grade C esophagitis.                           - Non-bleeding esophageal ulcers.                           - Non-bleeding erosive gastropathy.                           - Erythematous and nodular mucosa in    Nausea and vomitting improved. For trial of NG clamping today  Remains in AF on IV amio. Off AC due to leg hematoma and hematemesis. Denies CP or SOB. Got IVF overnight in setting of NPO status and NGT to LIWS   Co-ox 65% Creatinine improved to 1.2  Objective:   Weight Range:  Vital Signs:   Temp:  [98.1 F (36.7 C)-98.3 F (36.8 C)] 98.1 F (36.7 C) (03/24 2205) Pulse Rate:  [80-88] 88 (03/25 0517) Resp:  [14-19] 14 (03/25 0517) BP: (101-102)/(61-65) 101/65 (03/25 0517) SpO2:  [92 %-96 %] 92 % (03/25 0517) Weight:  [131.6 kg] 131.6 kg (03/25 0517) Last BM Date: 10/19/18  Weight change: Filed Weights   10/18/18 0535 10/18/18 0716 10/21/18 0517  Weight: (!) 137.1 kg (!) 137.1 kg 131.6 kg   Intake/Output:   Intake/Output Summary (Last 24 hours) at 10/21/2018 0853 Last data filed at 10/21/2018 0452 Gross per 24 hour  Intake 1241.41 ml  Output 1125 ml  Net 116.41 ml    Physical Exam   General:  Obese male lying in bed. No resp difficulty HEENT: normal Neck: supple. no JVD. Carotids 2+ bilat; no bruits. No lymphadenopathy or thryomegaly appreciated. Cor: PMI nondisplaced. Irregular rate & rhythm. No rubs, gallops or murmurs. Lungs: clear Abdomen: obese soft, nontender, mildly distended. No hepatosplenomegaly. No bruits or masses. Good bowel sounds. Extremities: no cyanosis, clubbing, rash, edema  +UNNA boots. RUE PICC  R thigh hematoma much improved  Neuro: alert & orientedx3, cranial nerves grossly intact. moves all 4 extremities w/o difficulty. Affect pleasant   Telemetry   Afib 80-90s, personally reviewed.    Labs    Basic Metabolic Panel: Recent Labs  Lab 10/15/18 0942   10/17/18 0317 10/18/18 0333 10/19/18 0501 10/20/18 0437 10/21/18 0532  NA  --    < > 132* 130* 133* 134* 134*  K  --    < > 4.5 3.8 3.7 3.4* 3.2*  CL  --    < > 98 97* 101 101 103  CO2  --    < > 21* GLUCOSE  --    < > 105* 225* 117* 135* 110*  BUN  --    < > 40* 38* 37* 41* 34*  CREATININE  --    < > 1.96* 1.62* 1.53* 1.53* 1.21  CALCIUM  --    < > 8.0* 7.6* 7.6* 7.7* 7.3*  MG 2.0  --   --   --   --   --   --    < > = values in this interval not displayed.   Liver Function Tests: No results for input(s): AST, ALT, ALKPHOS, BILITOT, PROT, ALBUMIN in the last 168 hours. No results for input(s): LIPASE, AMYLASE in the last 168  hours. No results for input(s): AMMONIA in the last 168 hours.  CBC: Recent Labs  Lab 10/15/18 0942  10/19/18 1617 10/20/18 0437 10/20/18 1600 10/21/18 0532 10/21/18 0623  WBC 12.2*   < > 8.2 8.0 9.2 10.9* 12.5*  NEUTROABS 10.1*  --   --   --   --   --   --   HGB 10.4*   < > 8.8* 8.1* 7.6* 7.3* 8.0*  HCT 31.7*   < > 28.8* 25.9* 24.3* 24.5* 25.2*  MCV 87.8   < > 91.4 92.5 90.7 93.2 92.6  PLT 351   < > 429* 397 318 315 339   < > = values in this interval not displayed.   Cardiac Enzymes: No results for input(s): CKTOTAL, CKMB, CKMBINDEX, TROPONINI in the last 168 hours.  BNP: BNP (last 3 results) Recent Labs    10/01/18 1337 10/01/18 1720  BNP 2,769.2* 2,333.1*   ProBNP (last 3 results) No results for input(s): PROBNP in the last 8760 hours.  Other results:  Imaging: Ct Abdomen Pelvis W Contrast  Result Date: 10/19/2018 CLINICAL DATA:  Small bowel obstruction. EXAM: CT ABDOMEN AND PELVIS WITH CONTRAST TECHNIQUE: Multidetector CT imaging of the abdomen and pelvis was performed using the standard protocol following bolus administration of intravenous contrast. CONTRAST:  OMNIPAQUE IOHEXOL 300 MG/ML  SOLN COMPARISON:  CT scan of October 01, 2018. FINDINGS: Lower chest: Moderate right pleural effusion is noted with adjacent  right lower lobe atelectasis. Hepatobiliary: No focal liver abnormality is seen. No gallstones, gallbladder wall thickening, or biliary dilatation. Pancreas: Unremarkable. No pancreatic ductal dilatation or surrounding inflammatory changes. Spleen: Normal in size without focal abnormality. Adrenals/Urinary Tract: Adrenal glands appear normal. Large right renal cyst is noted. No hydronephrosis or renal obstruction is noted. No renal or ureteral calculi are noted. Urinary bladder is unremarkable. Stomach/Bowel: Stomach appears normal. Proximal small bowel dilatation is noted concerning for distal small bowel obstruction or possibly ileus. No definite transition zone is noted. The appendix is not visualized, although no inflammation is noted in the right lower quadrant. Mild wall thickening of the rectum is noted suggesting possible inflammation. Vascular/Lymphatic: Aortic atherosclerosis. No enlarged abdominal or pelvic lymph nodes. Reproductive: Prostate is unremarkable. Other: No abdominal wall hernia or abnormality. No abdominopelvic ascites. Musculoskeletal: No acute or significant osseous findings. IMPRESSION: Proximal small bowel dilatation is noted concerning for distal small bowel obstruction or possibly ileus. No definite focal transition zone is noted. Mild wall thickening of the rectum is noted suggesting possible inflammation. Moderate right pleural effusion with adjacent right lower lobe subsegmental atelectasis. Aortic Atherosclerosis (ICD10-I70.0). Electronically Signed   By: Lupita Raider, M.D.   On: 10/19/2018 13:24   Dg Abd Portable 1v  Result Date: 10/19/2018 CLINICAL DATA:  Nasogastric tube repositioning EXAM: PORTABLE ABDOMEN - 1 VIEW COMPARISON:  October 19, 2018 study obtained earlier in the day FINDINGS: Nasogastric tube tip and side port in stomach. There are loops of dilated bowel consistent with either ileus or a degree of obstruction. No free air. There is consolidation in the left  base. IMPRESSION: Nasogastric tube tip and side port in stomach. Loops of dilated bowel persist. No free air. Consolidation left lung base. Electronically Signed   By: Bretta Bang III M.D.   On: 10/19/2018 19:59   Dg Abd Portable 1v  Result Date: 10/19/2018 CLINICAL DATA:  Repositioning of nasogastric tube EXAM: PORTABLE ABDOMEN - 1 VIEW COMPARISON:  Portable exam 1644 hours compared to 1526 hours FINDINGS:  Nasogastric tube coiled in distal esophagus. Air-filled distended loops of large and small bowel in the upper abdomen. Opacified LEFT lung base. Bones demineralized. IMPRESSION: Nasogastric tube remains coiled at the distal esophagus. Electronically Signed   By: Ulyses Southward M.D.   On: 10/19/2018 17:14   Dg Abd Portable 1v  Result Date: 10/19/2018 CLINICAL DATA:  Check gastric catheter placement EXAM: PORTABLE ABDOMEN - 1 VIEW COMPARISON:  None. FINDINGS: Scattered large and small bowel gas is noted. Gastric catheter is noted coiled upon itself in the distal esophagus. It does not appear to enter the stomach. This should be withdrawn slightly and readvanced. IMPRESSION: Gastric catheter appears coiled upon itself within the distal esophagus. Electronically Signed   By: Alcide Clever M.D.   On: 10/19/2018 15:46    Medications:    Scheduled Medications: . digoxin  0.125 mg Intravenous Daily  . hydrocortisone  25 mg Rectal BID  . mouth rinse  15 mL Mouth Rinse BID  . nicotine  21 mg Transdermal Daily  . pantoprazole (PROTONIX) IV  40 mg Intravenous Q12H  . sodium chloride flush  10-40 mL Intracatheter Q12H  . sodium chloride flush  3 mL Intravenous Q12H    Infusions: . sodium chloride    . amiodarone 30 mg/hr (10/21/18 0831)  . potassium chloride      PRN Medications: sodium chloride, acetaminophen, alum & mag hydroxide-simeth, ondansetron (ZOFRAN) IV, sodium chloride flush, sodium chloride flush, witch hazel-glycerin  Assessment:   Aaron [name] is a 53 y.o. male with h/o of  obesity who presented with c/o fluid retention, abdominal swelling, and peripheral edema x 3-4 weeks.  Echo showed new systolic CHF 15%.    CHF team consulted for follow up and treatment. Cath results pending at time of consultation.   Plan/Discussion:    1. Acute systolic CHF -> cardiogenic shock due to iCM - Echo 10/02/18 LVEF 15%, Normal RV, Mod LAE, Mod MR, Mild AI, Mild PI.  - Cath 3/13 showed severe 3v CAD with markedly decompensated hemodynamics - Off milrinone co-ox 71% - Volume status ok. Received IVF due to AKI and NPO status.Now unable to take po again. May need to restart IVF gently. Weight and renal function stable today.  - Holding diuretics, spiro and losartan with hypotension in setting of R thigh hematoma and UGI bleed. Can restart once BP improves and taking po meds  - Will restart spiro in am to help with HF and hypokalemia if taking po - Continue digoxin 0.125 mg daily.  Level is approprioate - No b-blocker yet with shock - Continue UNNA boots  2. CAD - Severe 3v CAD as above. Has been seen by Dr. Dorris Fetch ([facility]). Not CABG candidate currently given hemodynamics and lack of mobility. Plan was for PCI of LCX and LAD but given large R leg hematoma now plan for medical management,  - No s/s of ischemia despite physiologic stressors.  - Continue ASA and statin. No b-blocker yet with shock. Plavix stopped   3. Afib with RVR, new - Off milrinone. - Back on IV amio at 30 given NPO status and elevated rates. HR improved - This patients CHA2DS2-VASc Score is at least 3.  - Anticoag on hold now with hematoma and UGI bleeding.   4. Acute large R thigh hematoma with acute blood loss anemia - Heparin off. Ortho has seen -> appreciate their recs  - Has involvement of rectus femoris muscle and femoral nerve.  - Brace applied. - Hgb stable . Will  continue to follow - Continue SCDs. May be able to start DVT dose lovenox soon.   5.  Acute ileus/SBO with Upper GI bleed - GI  [facility] managing (thank you) - EGD results 3/22 and CT scan 3/23 results  noted.  - suspect ileus in setting of systemic illness. Seems to be improving   4. AKI - Cr 1.44 on admit. Peaked at 1.96 on 3/21 due to hypotension and ATN. Today 1.21 after IVF yesterday  - Diuretics on hold.  5. Hypokalemia - K 3.2 today. Being supped.  --Will restart spiro in am to help with HF and hypokalemia if taking po  6. Severe deconditioning due to obesity - PT and CIR following.   Length of Stay: 20  Arvilla Meres, MD 10/21/2018, 8:53 AM  Advanced Heart Failure Team Pager [phone] (M-F; 7a - 4p)  Please contact [facility] for night-coverage after hours (4p -7a ) and weekends on [email or web address]

## 2018-10-21 NOTE — Progress Notes (Signed)
Occupational Therapy Treatment Patient Details Name: Aaron [name] MRN: [id number] DOB: 13-Jan-1966 Today's Date: 10/21/2018    History of present illness Pt is a 53 y.o. male admitted 10/01/18 with progressive fluid retention, abdominal/BLE swelling. Worked up for anasarca and CHF with EF 15%. Transferred to [facility] for cardiac cath 3/13, found to have multivessel CAD; not a candidate for CABG. CT 3/19 with acute R thigh hematoma; planned PCI deferred secondary to this. Pt with positive gastric occult blood; KUB shows ileus. PMH includes obesity.   OT comments  Pt eager to work with therapies. NGT clamped, VSS throughout session on 4L 02. Requires 2 person assist for supine<>sit. Attempted to stand from elevated bed x 3 with RW and bledsoe brace, but pt unable despite his best efforts. Total assist for bed level pericare. Pt performed grooming at bed level with bed in partial chair position. Will continue to follow.  Follow Up Recommendations  CIR;Supervision/Assistance - 24 hour    Equipment Recommendations  Other (comment)(defer to next venue)    Recommendations for Other Services      Precautions / Restrictions Precautions Precautions: Fall Precaution Comments: monitor O2 and HR, now needs Bledsoe brace locked in extension R LE when getting up  Required Braces or Orthoses: Other Brace Other Brace: bledsoe R LE when getting up  Restrictions Weight Bearing Restrictions: No RLE Weight Bearing: Weight bearing as tolerated       Mobility Bed Mobility Overal bed mobility: Needs Assistance Bed Mobility: Rolling;Supine to Sit;Sit to Supine Rolling: Mod assist   Supine to sit: HOB elevated;+2 for physical assistance;Max assist Sit to supine: Mod assist;+2 for physical assistance   General bed mobility comments: Needed mod assist for mobility.  Mod assist to roll due to weight of LES.  Mod assist for supine to sit as needed to help LEs and use pad to decr shear of the rectal pouch.  Also  needed elevation of the trunk.   Transfers Overall transfer level: Needs assistance Equipment used: Rolling walker (2 wheeled) Transfers: Sit to/from Stand Sit to Stand: From elevated surface;Max assist;Total assist;+2 physical assistance         General transfer comment: Pt could not clear bottom off bed without max to total assist with Bledsoe brace in place.  Attempted x 3.  Pt became dizzy after the attempts therefore laid pt back down.     Balance Overall balance assessment: Needs assistance Sitting-balance support: Feet supported;Bilateral upper extremity supported Sitting balance-Leahy Scale: Poor Sitting balance - Comments: Pt at times min guard assist but at times mod assist due to posterior lean. Pt sat a total of 10 min EOB and performed LE exercise with left LE.    Standing balance support: Bilateral upper extremity supported Standing balance-Leahy Scale: Zero Standing balance comment: could not come to standing with +2 max to total assist.                            ADL either performed or assessed with clinical judgement   ADL Overall ADL's : Needs assistance/impaired     Grooming: Wash/dry face;Bed level;Set up                       Toileting- Clothing Manipulation and Hygiene: Total assistance;+2 for physical assistance;Bed level               Vision       Perception     Praxis  Cognition Arousal/Alertness: Awake/alert Behavior During Therapy: WFL for tasks assessed/performed Overall Cognitive Status: Within Functional Limits for tasks assessed                                          Exercises General Exercises - Lower Extremity Ankle Circles/Pumps: AROM;Both;Supine;20 reps Long Arc Quad: Seated;AROM;10 reps;Left Heel Slides: AAROM;Both;Supine;5 reps   Shoulder Instructions       General Comments      Pertinent Vitals/ Pain       Pain Assessment: No/denies pain  Home Living                                           Prior Functioning/Environment              Frequency  Min 3X/week        Progress Toward Goals  OT Goals(current goals can now be found in the care plan section)  Progress towards OT goals: Progressing toward goals  Acute Rehab OT Goals Patient Stated Goal: "Get over this and work again" OT Goal Formulation: With patient Time For Goal Achievement: 10/26/18 Potential to Achieve Goals: Good  Plan Discharge plan remains appropriate    Co-evaluation    PT/OT/SLP Co-Evaluation/Treatment: Yes Reason for Co-Treatment: Complexity of the patient's impairments (multi-system involvement)   OT goals addressed during session: ADL's and self-care;Strengthening/ROM      AM-PAC OT "6 Clicks" Daily Activity     Outcome Measure   Help from another person eating meals?: None(ice chips) Help from another person taking care of personal grooming?: A Little Help from another person toileting, which includes using toliet, bedpan, or urinal?: Total Help from another person bathing (including washing, rinsing, drying)?: A Lot Help from another person to put on and taking off regular upper body clothing?: A Little Help from another person to put on and taking off regular lower body clothing?: Total 6 Click Score: 14    End of Session Equipment Utilized During Treatment: Rolling walker;Gait belt;Oxygen(4L) CPM Right Knee Additional Comments: foot roll  OT Visit Diagnosis: Unsteadiness on feet (R26.81);Other abnormalities of gait and mobility (R26.89);Muscle weakness (generalized) (M62.81);Pain   Activity Tolerance Patient tolerated treatment well   Patient Left in bed;with call bell/phone within reach;with bed alarm set   Nurse Communication Other (comment)(changed dressing on back side)        Time: 9292-4462 OT Time Calculation (min): 34 min  Charges: OT General Charges $OT Visit: 1 Visit OT Treatments $Therapeutic Activity: 8-22  mins  Martie Round, OTR/L Acute Rehabilitation Services Pager: [phone] Office: [phone]  Evern Bio 10/21/2018, 11:00 AM

## 2018-10-22 ENCOUNTER — Inpatient Hospital Stay (HOSPITAL_COMMUNITY): Payer: Medicaid Other

## 2018-10-22 LAB — COOXEMETRY PANEL
Carboxyhemoglobin: 2 % — ABNORMAL HIGH (ref 0.5–1.5)
Methemoglobin: 1.6 % — ABNORMAL HIGH (ref 0.0–1.5)
O2 Saturation: 69.8 %
Total hemoglobin: 8.1 g/dL — ABNORMAL LOW (ref 12.0–16.0)

## 2018-10-22 LAB — BASIC METABOLIC PANEL
Anion gap: 9 (ref 5–15)
BUN: 27 mg/dL — ABNORMAL HIGH (ref 6–20)
CO2: 23 mmol/L (ref 22–32)
Calcium: 7.8 mg/dL — ABNORMAL LOW (ref 8.9–10.3)
Chloride: 101 mmol/L (ref 98–111)
Creatinine, Ser: 0.99 mg/dL (ref 0.61–1.24)
GFR calc Af Amer: >60 mL/min (ref >60)
GFR calc non Af Amer: >60 mL/min (ref >60)
Glucose, Bld: 143 mg/dL — ABNORMAL HIGH (ref 70–99)
POTASSIUM: 3.4 mmol/L — AB (ref 3.5–5.1)
Sodium: 133 mmol/L — ABNORMAL LOW (ref 135–145)

## 2018-10-22 LAB — CBC
HCT: 24.2 % — ABNORMAL LOW (ref 39.0–52.0)
Hemoglobin: 7.7 g/dL — ABNORMAL LOW (ref 13.0–17.0)
MCH: 29.7 pg (ref 26.0–34.0)
MCHC: 31.8 g/dL (ref 30.0–36.0)
MCV: 93.4 fL (ref 80.0–100.0)
Platelets: 319 10*3/uL (ref 150–400)
RBC: 2.59 MIL/uL — ABNORMAL LOW (ref 4.22–5.81)
RDW: 17.1 % — ABNORMAL HIGH (ref 11.5–15.5)
WBC: 13.8 10*3/uL — ABNORMAL HIGH (ref 4.0–10.5)
nRBC: 0 % (ref 0.0–0.2)

## 2018-10-22 MED ORDER — ENOXAPARIN SODIUM 30 MG/0.3ML ~~LOC~~ SOLN
30.0000 mg | SUBCUTANEOUS | Status: DC
  Administered 2018-10-22 – 2018-10-29 (×8): 30 mg via SUBCUTANEOUS
  Filled 2018-10-22 (×8): qty 0.3

## 2018-10-22 MED ORDER — POTASSIUM CHLORIDE 10 MEQ/50ML IV SOLN
10.0000 meq | INTRAVENOUS | Status: AC
  Administered 2018-10-22 (×5): 10 meq via INTRAVENOUS
  Filled 2018-10-22 (×6): qty 50

## 2018-10-22 MED ORDER — SPIRONOLACTONE 12.5 MG HALF TABLET
12.5000 mg | ORAL_TABLET | Freq: Every day | ORAL | Status: DC
  Administered 2018-10-22 – 2018-10-26 (×5): 12.5 mg via ORAL
  Filled 2018-10-22 (×5): qty 1

## 2018-10-22 NOTE — Progress Notes (Signed)
[facility] Progress Note  Aaron [name] 53 y.o. 09/28/1965   Subjective: Tolerated NGT clamping yesterday. Feels ok. Denies abd pain. About to be fed jello from nurse  Objective: Vital signs: Vitals:   10/22/18 0012 10/22/18 0638  BP: 115/64 106/64  Pulse: 89 90  Resp: (!) 23 (!) 21  Temp:  97.8 F (36.6 C)  SpO2: 96% 96%    Physical Exam: Gen: lethargic, no acute distress  HEENT: anicteric sclera CV: RRR Chest: CTA B Abd: distended, diffuse tenderness with minimal guarding, +BS   Lab Results: Recent Labs    10/21/18 0532 10/22/18 0423  NA 134* 133*  K 3.2* 3.4*  CL 103 101  CO2 25 23  GLUCOSE 110* 143*  BUN 34* 27*  CREATININE 1.21 0.99  CALCIUM 7.3* 7.8*   No results for input(s): AST, ALT, ALKPHOS, BILITOT, PROT, ALBUMIN in the last 72 hours. Recent Labs    10/21/18 0623 10/22/18 0423  WBC 12.5* 13.8*  HGB 8.0* 7.7*  HCT 25.2* 24.2*  MCV 92.6 93.4  PLT 339 319      Assessment/Plan: Ileus - unchanged ileus on Xray. BM yesterday morning. Tolerating NGT clamping. Agree with trials of clears. Replace potassium. Supportive care.   [name] 10/22/2018, 12:47 PM  Questions please call [name], male   DOB: 1966-01-28, 53 y.o.   MRN: [id number]

## 2018-10-22 NOTE — Progress Notes (Addendum)
Advanced Heart Failure Rounding Note   Subjective:    EGD 3/22       - LA Grade C esophagitis.                           - Non-bleeding esophageal ulcers.                           - Non-bleeding erosive gastropathy.                           - Erythematous and nodular mucosa in    Passed NGT clamp test yesterday. Being advanced to Jello today. KUB with persistent ileus. (Personally reviewed)  Remains in AF on IV amio. Off AC due to leg hematoma and hematemesis. Denies CP or SOB. No orthopnea or PND.   Co-ox 69% Creatinine improved to 0.99 Hgb relatively stable at 7.7  Objective:   Weight Range:  Vital Signs:   Temp:  [97.8 F (36.6 C)-98.2 F (36.8 C)] 97.8 F (36.6 C) (03/26 2094) Pulse Rate:  [77-90] 90 (03/26 0638) Resp:  [19-23] 21 (03/26 7096) BP: (104-115)/(64-75) 106/64 (03/26 0638) SpO2:  [94 %-96 %] 96 % (03/26 0638) Weight:  [129.8 kg] 129.8 kg (03/26 0638) Last BM Date: 10/21/18  Weight change: Filed Weights   10/18/18 0716 10/21/18 0517 10/22/18 2836  Weight: (!) 137.1 kg 131.6 kg 129.8 kg   Intake/Output:   Intake/Output Summary (Last 24 hours) at 10/22/2018 1016 Last data filed at 10/22/2018 0000 Gross per 24 hour  Intake 644.1 ml  Output 325 ml  Net 319.1 ml    Physical Exam   General:  Obese male lying in bed. Weak appearing No resp difficulty HEENT: normal +NGT Neck: supple. no JVD. Carotids 2+ bilat; no bruits. No lymphadenopathy or thryomegaly appreciated. Cor: PMI nondisplaced. Regular rate & rhythm. No rubs, gallops or murmurs. Lungs: clear Abdomen: obese soft, nontender, nondistended. No hepatosplenomegaly. No bruits or masses. Good bowel sounds. Extremities: no cyanosis, clubbing, rash, edema +UNNA boots  Neuro: alert & orientedx3, cranial nerves grossly intact. moves all 4 extremities w/o difficulty. Affect pleasant   Telemetry   Afib 80-90ss, personally reviewed.    Labs    Basic Metabolic Panel: Recent Labs  Lab 10/18/18  0333 10/19/18 0501 10/20/18 0437 10/21/18 0532 10/22/18 0423  NA 130* 133* 134* 134* 133*  K 3.8 3.7 3.4* 3.2* 3.4*  CL 97* 101 101 103 101  CO2 25 23 23 25 23   GLUCOSE 225* 117* 135* 110* 143*  BUN 38* 37* 41* 34* 27*  CREATININE 1.62* 1.53* 1.53* 1.21 0.99  CALCIUM 7.6* 7.6* 7.7* 7.3* 7.8*   Liver Function Tests: No results for input(s): AST, ALT, ALKPHOS, BILITOT, PROT, ALBUMIN in the last 168 hours. No results for input(s): LIPASE, AMYLASE in the last 168 hours. No results for input(s): AMMONIA in the last 168 hours.  CBC: Recent Labs  Lab 10/20/18 0437 10/20/18 1600 10/21/18 0532 10/21/18 0623 10/22/18 0423  WBC 8.0 9.2 10.9* 12.5* 13.8*  HGB 8.1* 7.6* 7.3* 8.0* 7.7*  HCT 25.9* 24.3* 24.5* 25.2* 24.2*  MCV 92.5 90.7 93.2 92.6 93.4  PLT 397 318 315 339 319   Cardiac Enzymes: No results for input(s): CKTOTAL, CKMB, CKMBINDEX, TROPONINI in the last 168 hours.  BNP: BNP (last 3 results) Recent Labs    10/01/18 1337 10/01/18 1720  BNP  2,769.2* 2,333.1*   ProBNP (last 3 results) No results for input(s): PROBNP in the last 8760 hours.  Other results:  Imaging: Dg Abd 1 View  Result Date: 10/22/2018 CLINICAL DATA:  Small-bowel obstruction. EXAM: ABDOMEN - 1 VIEW COMPARISON:  10/19/2018; 10/17/2018; CT abdomen pelvis-10/19/2018 FINDINGS: Enteric tube tip and side port project over the expected location of the gastric antrum. Grossly unchanged moderate to marked gaseous distention of several loops of large and small bowel with index loop of small bowel within the right mid hemiabdomen measuring 6.4 cm in diameter, grossly unchanged. Nondiagnostic evaluation for pneumoperitoneum secondary to exclusion of the lower thorax. No definite pneumatosis or portal venous gas. No acute osseous abnormalities. IMPRESSION: Similar findings most suggestive of ileus. Electronically Signed   By: Simonne Come M.D.   On: 10/22/2018 08:05    Medications:    Scheduled Medications: .  digoxin  0.125 mg Intravenous Daily  . hydrocortisone  25 mg Rectal BID  . mouth rinse  15 mL Mouth Rinse BID  . nicotine  21 mg Transdermal Daily  . pantoprazole (PROTONIX) IV  40 mg Intravenous Q12H  . sodium chloride flush  10-40 mL Intracatheter Q12H  . sodium chloride flush  3 mL Intravenous Q12H    Infusions: . sodium chloride    . amiodarone 30 mg/hr (10/22/18 0703)  . potassium chloride 10 mEq (10/22/18 1011)    PRN Medications: sodium chloride, acetaminophen, alum & mag hydroxide-simeth, ondansetron (ZOFRAN) IV, sodium chloride flush, sodium chloride flush, witch hazel-glycerin  Assessment:   Aaron [name] is a 53 y.o. male with h/o of obesity who presented with c/o fluid retention, abdominal swelling, and peripheral edema x 3-4 weeks.  Echo showed new systolic CHF 15%.    CHF team consulted for follow up and treatment. Cath results pending at time of consultation.   Plan/Discussion:    1. Acute systolic CHF -> cardiogenic shock due to iCM - Echo 10/02/18 LVEF 15%, Normal RV, Mod LAE, Mod MR, Mild AI, Mild PI.  - Cath 3/13 showed severe 3v CAD with markedly decompensated hemodynamics - Off milrinone co-ox 69% - Volume status looks much better. Weight down given NPO status  - Have been holding diuretics, spiro and losartan with hypotension in setting of R thigh hematoma and UGI bleed. Can restart once BP improves and taking po meds  - Will restart spiro to help with HF and hypokalemi - Continue digoxin 0.125 mg daily.  Level is approprioate - No b-blocker yet with shock - Continue UNNA boots  2. CAD - Severe 3v CAD as above. Has been seen by Dr. Dorris Fetch ([facility]). Not CABG candidate currently given hemodynamics and lack of mobility. Plan was for PCI of LCX and LAD but given large R leg hematoma now plan for medical management,  - No s/s of ischemia despite physiologic stressors.  - Continue ASA and statin. No b-blocker yet with shock. Plavix stopped   3. Afib with  RVR, new - Off milrinone. - Back on IV amio at 30 given NPO status and elevated rates. HR improved - This patients CHA2DS2-VASc Score is at least 3.  - Anticoag on hold now with hematoma and UGI bleeding. Will restart DVT dose lovenox.  - May consider Eliquis prior to d/c  4. Acute large R thigh hematoma with acute blood loss anemia - Heparin off. Ortho has seen -> appreciate their recs  - Has involvement of rectus femoris muscle and femoral nerve.  - Brace applied. - Hgb stable at  7.7 . Will continue to follow - Continue SCDs.  - Start DVT dose lovenox  5.  Acute ileus/SBO with Upper GI bleed - GI amd TRH managing (thank you) - EGD results 3/22 and CT scan 3/23 results  noted.  - suspect ileus in setting of systemic illness. Clinically improving but KUB still with persistent ileus  - Can consider Reglan as needed  4. AKI - Resolved  5. Hypokalemia - K 3.4 today. Being supped.  --Will restart spiro  6. Severe deconditioning due to obesity - PT and CIR following.   Length of Stay: 21  Arvilla Meres, MD 10/22/2018, 10:16 AM  Advanced Heart Failure Team Pager [phone] (M-F; 7a - 4p)  Please contact [facility] for night-coverage after hours (4p -7a ) and weekends on [email or web address]

## 2018-10-22 NOTE — Progress Notes (Signed)
Physical Therapy Treatment Patient Details Name: Aaron [name] MRN: [id number] DOB: 06/28/1966 Today's Date: 10/22/2018    History of Present Illness Pt is a 53 y.o. male admitted 10/01/18 with progressive fluid retention, abdominal/BLE swelling. Worked up for anasarca and CHF with EF 15%. Transferred to [facility] for cardiac cath 3/13, found to have multivessel CAD; not a candidate for CABG. CT 3/19 with acute R thigh hematoma; planned PCI deferred secondary to this. Pt with positive gastric occult blood; KUB shows ileus. PMH includes obesity.    PT Comments    Pt admitted with above diagnosis. Pt currently with functional limitations due to balance and endurance deficits. Pt was able to tolerate standing in tilt bed for 4 minutes.  Very motivated.  Feel that pt is  agood rehab candidate.   Pt will benefit from skilled PT to increase their independence and safety with mobility to allow discharge to the venue listed below.     Follow Up Recommendations  CIR;Supervision/Assistance - 24 hour     Equipment Recommendations  None recommended by PT    Recommendations for Other Services       Precautions / Restrictions Precautions Precautions: Fall Precaution Comments: monitor O2 and HR, now needs Bledsoe brace locked in extension R LE when getting up, Vital Go tilt bed Required Braces or Orthoses: Other Brace Other Brace: bledsoe R LE when getting up  Restrictions Weight Bearing Restrictions: No RLE Weight Bearing: Weight bearing as tolerated    Mobility  Bed Mobility Overal bed mobility: Needs Assistance Bed Mobility: Rolling;Supine to Sit;Sit to Supine Rolling: Min assist;+2 for physical assistance         General bed mobility comments: Scootedpt from the regular bed to the tilt bed with the assist of nursing.    Transfers Overall transfer level: Needs assistance Equipment used: (Vital go Tilt bed) Transfers: Sit to/from Stand Sit to Stand: From elevated surface;Total assist;+2  physical assistance         General transfer comment: Progressively inclined Vital go tilt bed to 75 degrees.  Pt tolerated for 4 minutes and was feeling dizzy thefore laid pt back.  BP was 106/81 at end of treatment   Nursing was present entire tilt and they are aware they can tilt pt multiple times (up to 3 more is beneficial) a day   Ambulation/Gait                 Stairs             Wheelchair Mobility    Modified Rankin (Stroke Patients Only)       Balance Overall balance assessment: Needs assistance         Standing balance support: No upper extremity supported;During functional activity Standing balance-Leahy Scale: Zero Standing balance comment: supported by Vital Go tilt bed                            Cognition Arousal/Alertness: Awake/alert Behavior During Therapy: WFL for tasks assessed/performed Overall Cognitive Status: Within Functional Limits for tasks assessed                                        Exercises General Exercises - Lower Extremity Quad Sets: AROM;Both;Supine;5 reps(with glut set as well )    General Comments General comments (skin integrity, edema, etc.): Also showed nursing and pt how to tilt  partially and he can do squats and quad and glut sets.       Pertinent Vitals/Pain Pain Assessment: No/denies pain    Home Living                      Prior Function            PT Goals (current goals can now be found in the care plan section) Acute Rehab PT Goals Patient Stated Goal: "Get over this and work again" Progress towards PT goals: Progressing toward goals    Frequency    Min 3X/week      PT Plan Discharge plan needs to be updated    Co-evaluation              AM-PAC PT "6 Clicks" Mobility   Outcome Measure  Help needed turning from your back to your side while in a flat bed without using bedrails?: A Lot Help needed moving from lying on your back to sitting on  the side of a flat bed without using bedrails?: A Lot Help needed moving to and from a bed to a chair (including a wheelchair)?: Total Help needed standing up from a chair using your arms (e.g., wheelchair or bedside chair)?: Total Help needed to walk in hospital room?: Total Help needed climbing 3-5 steps with a railing? : Total 6 Click Score: 8    End of Session Equipment Utilized During Treatment: Oxygen;Gait belt(4LO2 with sats 88-92%) Activity Tolerance: Patient tolerated treatment well Patient left: in bed;with call bell/phone within reach(bed in chair position) Nurse Communication: Mobility status PT Visit Diagnosis: Muscle weakness (generalized) (M62.81);Difficulty in walking, not elsewhere classified (R26.2);Pain Pain - part of body: (abdomen )     Time: 3086-5784 PT Time Calculation (min) (ACUTE ONLY): 57 min  Charges:  $Gait Training: 8-22 mins $Therapeutic Exercise: 8-22 mins $Therapeutic Activity: 23-37 mins                     Kasi Lasky,PT Acute Rehabilitation Services Pager:  [phone]  Office:  [phone]     Berline Lopes 10/22/2018, 12:40 PM

## 2018-10-22 NOTE — Progress Notes (Addendum)
PROGRESS NOTE    Aaron [name]  GGY:[id number] DOB: [birth year] DOA: 10/01/2018 PCP: [facility] [place]     Brief Narrative:  Aaron [name] is a 53 y.o. male with significant past medical history other than obesity who previously worked as a Radiation protection practitioner [facility] on March 5 with anasarca and 100 pound weight gain.  He also reported abdominal pain/intermittent diarrhea and bloating on admission which he related to?  IBS.  Wife apparently expressed concern about mild confusion at home at the time of admission. In the ED patient was noted to be tachycardic, labs revealed AKI with BUN of 53, creatinine 1.4, his LFTs were slightly elevated in 200s and BNP was elevated at 2769.  CT abdomen/pelvis showed diffuse anasarca, ascites and small bilateral pleural effusions.  Admission EKG showed normal sinus rhythm with T wave inversions in inferolateral leads.  Patient was admitted for further work-up of anasarca. Echocardiogram revealed EF of 15% with diffuse hypokinesis with left atrial moderately dilated, moderate MR and mild AR.  Patient was managed with Coreg and Lasix IV/milrinone drip/Spironolactone.  Cardiology was consulted. Hospital course was complicated by A. fib with RVR requiring heparin drip. Patient subsequently underwent cardiac cath on March 13 which revealed severe three-vessel CAD (Moderate mid LAD disease, tight distal lesion beyond reach of IMA. Severe disease in circumflex. RCA totally occluded). PCI of LAD/LCx versus CABG was considered but cardiothoracic surgery evaluated patient and did not feel he is candidate for CABG, recommended aggressive medical therapy.  Patient was maintained on aspirin, statins and Heparin.  On March 18 patient developed pain in his right thigh and noticed a hard swelling for which CT lower extremity was obtained revealing a hematoma.  Orthopedics was consulted who recommended nonoperative management of hematoma and felt patient  might have resultant temporary femoral nerve paralysis.  Hospital course also complicated by upper GI bleed around the same time prompting GI evaluation.  In view of hypotension/anemia, hematoma and upper GI bleed, heparin was held, PCI deferred and patient underwent EGD on 3/22 revealing small esophageal ulcers, gastritis.  Serial x-rays and CT abdomen suggestive of adynamic ileus/bowel obstruction.  NG tube placed. Patient transferred back to [facility] due to ongoing multiple medical conditions.   New events last 24 hours / Subjective: Tolerated NGT clamp trial without worsening nausea, abdominal pain. Passing gas and had small BM yesterday. Wants to trial jello today without removing NGT. No complaints of chest pain, shortness of breath.   Assessment & Plan:   Principal Problem:   Acute systolic CHF (congestive heart failure) (HCC) Active Problems:   Anasarca   Elevated LFTs   AKI (acute kidney injury) (HCC)   Acute hypoxemic respiratory failure (HCC)   Morbid obesity (HCC)   Atrial fibrillation with rapid ventricular response (HCC)   Shortness of breath   Sinus tachycardia   Tachypnea   Hypokalemia   Ileus/small bowel obstruction: Tolerated NGT clamp trial. Repeat AXR still showing ileus. Will leave in NGT for now, trial small amount clear liquid diet.   Acute blood loss anemia/upper GI bleed: S/p EGD 3/22 showing grade C esophagitis, nonbleeding esophageal ulcers and nonbleeding erosive gastropathy.  Currently on PPI and off antiplatelet agents/anticoagulants.  Monitor hemoglobin and would transfuse less than 7.5 (upper threshold given cardiac arrhythmia and low EF close ).  Hemoglobin stable at 7.7 today.  Right thigh hematoma: Seen by orthopedics who recommended Bledsoe brace.  PT following.  Clinically improved with no swelling currently.  Hemoglobin  stable and improved.  Patient noted to be on compressive wraps on lower extremities (below the knee) but denies any history of leg wounds.    Multivessel CAD: Currently off antiplatelet agents/anticoagulants given GI bleed and right thigh hematoma.  Patient however has significant heart disease and understands the risks of continuing blood thinners versus holding.  Would consider enteric-coated aspirin if and when okay with GI/orthopedics and monitor serial hemoglobins.  Acute systolic CHF: Patient has significant cardiomyopathy with EF of 10 to 15%.  Required milrinone drip/IV diuretics in the initial hospital course.  Currently off beta-blockers and diuretics including aldactone held in view of hypotension.  Not an ACE inhibitors due to AKI.  On IV digoxin, periodic levels to be done to monitor toxicity in the setting of renal failure.  AKI: Present on admission.  Resolved now. Off diuretics currently.   New onset atrial fibrillation/NSVT: In the setting of left atrial enlargement and low EF on echo.  Cardiology following along.  Patient on amiodarone/digoxin.  Not on anticoagulation.   Morbid obesity: Body mass index is 34.83 kg/m.   Hypokalemia: Replace, trend   Tobacco abuse: Nicotine patch    DVT prophylaxis: SCD Code Status: Full Family Communication: Spoke with wife over the phone 3/26 Disposition Plan: CIR    Consultants:   Cardiology/advanced heart failure team  Cardiothoracic surgery  Orthopedic surgery  GI  Antimicrobials:  Anti-infectives (From admission, onward)   Start     Dose/Rate Route Frequency Ordered Stop   10/01/18 1515  cefTRIAXone (ROCEPHIN) 1 g in sodium chloride 0.9 % 100 mL IVPB     1 g 200 mL/hr over 30 Minutes Intravenous  Once 10/01/18 1502 10/01/18 2311   10/01/18 1515  azithromycin (ZITHROMAX) tablet 500 mg     500 mg Oral  Once 10/01/18 1502 10/01/18 1510       Objective: Vitals:   10/21/18 0853 10/21/18 2037 10/22/18 0012 10/22/18 0638  BP: 97/77 104/75 115/64 106/64  Pulse:  77 89 90  Resp: 19 19 (!) 23 (!) 21  Temp:  98.2 F (36.8 C)  97.8 F (36.6 C)    TempSrc:  Oral  Oral  SpO2: 96% 94% 96% 96%  Weight:    129.8 kg  Height:        Intake/Output Summary (Last 24 hours) at 10/22/2018 0923 Last data filed at 10/22/2018 0000 Gross per 24 hour  Intake 644.1 ml  Output 325 ml  Net 319.1 ml   Filed Weights   10/18/18 0716 10/21/18 0517 10/22/18 1735  Weight: (!) 137.1 kg 131.6 kg 129.8 kg    Examination: General exam: Appears calm and comfortable  Respiratory system: Clear to auscultation. Respiratory effort normal. Cardiovascular system: S1 & S2 heard, Irreg rhythm rate 80s. No JVD, murmurs, rubs, gallops or clicks. No pedal edema. Gastrointestinal system: Abdomen is nondistended, soft and nontender. Hypoactive bowel sounds. NGT clamped  Central nervous system: Alert and oriented. No focal neurological deficits. Extremities: Symmetric 5 x 5 power. Skin: No rashes, lesions or ulcers Psychiatry: Judgement and insight appear normal. Mood & affect appropriate.    Data Reviewed: I have personally reviewed following labs and imaging studies  CBC: Recent Labs  Lab 10/15/18 0942  10/20/18 0437 10/20/18 1600 10/21/18 0532 10/21/18 0623 10/22/18 0423  WBC 12.2*   < > 8.0 9.2 10.9* 12.5* 13.8*  NEUTROABS 10.1*  --   --   --   --   --   --   HGB 10.4*   < >  8.1* 7.6* 7.3* 8.0* 7.7*  HCT 31.7*   < > 25.9* 24.3* 24.5* 25.2* 24.2*  MCV 87.8   < > 92.5 90.7 93.2 92.6 93.4  PLT 351   < > 397 318 315 339 319   < > = values in this interval not displayed.   Basic Metabolic Panel: Recent Labs  Lab 10/15/18 0942  10/18/18 0333 10/19/18 0501 10/20/18 0437 10/21/18 0532 10/22/18 0423  NA  --    < > 130* 133* 134* 134* 133*  K  --    < > 3.8 3.7 3.4* 3.2* 3.4*  CL  --    < > 97* 101 101 103 101  CO2  --    < > 25 23 23 25 23   GLUCOSE  --    < > 225* 117* 135* 110* 143*  BUN  --    < > 38* 37* 41* 34* 27*  CREATININE  --    < > 1.62* 1.53* 1.53* 1.21 0.99  CALCIUM  --    < > 7.6* 7.6* 7.7* 7.3* 7.8*  MG 2.0  --   --   --   --    --   --    < > = values in this interval not displayed.   GFR: Estimated Creatinine Clearance: 125.5 mL/min (by C-G formula based on SCr of 0.99 mg/dL). Liver Function Tests: No results for input(s): AST, ALT, ALKPHOS, BILITOT, PROT, ALBUMIN in the last 168 hours. No results for input(s): LIPASE, AMYLASE in the last 168 hours. No results for input(s): AMMONIA in the last 168 hours. Coagulation Profile: No results for input(s): INR, PROTIME in the last 168 hours. Cardiac Enzymes: No results for input(s): CKTOTAL, CKMB, CKMBINDEX, TROPONINI in the last 168 hours. BNP (last 3 results) No results for input(s): PROBNP in the last 8760 hours. HbA1C: No results for input(s): HGBA1C in the last 72 hours. CBG: No results for input(s): GLUCAP in the last 168 hours. Lipid Profile: No results for input(s): CHOL, HDL, LDLCALC, TRIG, CHOLHDL, LDLDIRECT in the last 72 hours. Thyroid Function Tests: No results for input(s): TSH, T4TOTAL, FREET4, T3FREE, THYROIDAB in the last 72 hours. Anemia Panel: No results for input(s): VITAMINB12, FOLATE, FERRITIN, TIBC, IRON, RETICCTPCT in the last 72 hours. Sepsis Labs: No results for input(s): PROCALCITON, LATICACIDVEN in the last 168 hours.  No results found for this or any previous visit (from the past 240 hour(s)).     Radiology Studies: Dg Abd 1 View  Result Date: 10/22/2018 CLINICAL DATA:  Small-bowel obstruction. EXAM: ABDOMEN - 1 VIEW COMPARISON:  10/19/2018; 10/17/2018; CT abdomen pelvis-10/19/2018 FINDINGS: Enteric tube tip and side port project over the expected location of the gastric antrum. Grossly unchanged moderate to marked gaseous distention of several loops of large and small bowel with index loop of small bowel within the right mid hemiabdomen measuring 6.4 cm in diameter, grossly unchanged. Nondiagnostic evaluation for pneumoperitoneum secondary to exclusion of the lower thorax. No definite pneumatosis or portal venous gas. No acute  osseous abnormalities. IMPRESSION: Similar findings most suggestive of ileus. Electronically Signed   By: Simonne Come M.D.   On: 10/22/2018 08:05      Scheduled Meds:  digoxin  0.125 mg Intravenous Daily   hydrocortisone  25 mg Rectal BID   mouth rinse  15 mL Mouth Rinse BID   nicotine  21 mg Transdermal Daily   pantoprazole (PROTONIX) IV  40 mg Intravenous Q12H   sodium chloride flush  10-40 mL  Intracatheter Q12H   sodium chloride flush  3 mL Intravenous Q12H   Continuous Infusions:  sodium chloride     amiodarone 30 mg/hr (10/22/18 0703)   potassium chloride 10 mEq (10/22/18 0850)     LOS: 21 days    Time spent: 25 minutes   Noralee StainJennifer Kiarah Eckstein, DO [facility] [email or web address] 10/22/2018, 9:23 AM

## 2018-10-23 LAB — CBC
HCT: 24.7 % — ABNORMAL LOW (ref 39.0–52.0)
Hemoglobin: 8.1 g/dL — ABNORMAL LOW (ref 13.0–17.0)
MCH: 29.2 pg (ref 26.0–34.0)
MCHC: 32.8 g/dL (ref 30.0–36.0)
MCV: 89.2 fL (ref 80.0–100.0)
Platelets: 314 10*3/uL (ref 150–400)
RBC: 2.77 MIL/uL — ABNORMAL LOW (ref 4.22–5.81)
RDW: 17.2 % — ABNORMAL HIGH (ref 11.5–15.5)
WBC: 14.3 10*3/uL — ABNORMAL HIGH (ref 4.0–10.5)
nRBC: 0 % (ref 0.0–0.2)

## 2018-10-23 LAB — BASIC METABOLIC PANEL
Anion gap: 5 (ref 5–15)
BUN: 23 mg/dL — ABNORMAL HIGH (ref 6–20)
CHLORIDE: 104 mmol/L (ref 98–111)
CO2: 24 mmol/L (ref 22–32)
Calcium: 7.6 mg/dL — ABNORMAL LOW (ref 8.9–10.3)
Creatinine, Ser: 1 mg/dL (ref 0.61–1.24)
GFR calc Af Amer: >60 mL/min (ref >60)
GFR calc non Af Amer: >60 mL/min (ref >60)
Glucose, Bld: 131 mg/dL — ABNORMAL HIGH (ref 70–99)
Potassium: 3.8 mmol/L (ref 3.5–5.1)
Sodium: 133 mmol/L — ABNORMAL LOW (ref 135–145)

## 2018-10-23 LAB — COOXEMETRY PANEL
Carboxyhemoglobin: 2.5 % — ABNORMAL HIGH (ref 0.5–1.5)
Methemoglobin: 1.1 % (ref 0.0–1.5)
O2 Saturation: 74.9 %
Total hemoglobin: 8.1 g/dL — ABNORMAL LOW (ref 12.0–16.0)

## 2018-10-23 MED ORDER — LOSARTAN POTASSIUM 25 MG PO TABS
25.0000 mg | ORAL_TABLET | Freq: Every day | ORAL | Status: DC
  Administered 2018-10-23 – 2018-11-05 (×14): 25 mg via ORAL
  Filled 2018-10-23 (×14): qty 1

## 2018-10-23 MED ORDER — DIGOXIN 125 MCG PO TABS
0.1250 mg | ORAL_TABLET | Freq: Every day | ORAL | Status: DC
  Administered 2018-10-23 – 2018-11-05 (×14): 0.125 mg via ORAL
  Filled 2018-10-23 (×14): qty 1

## 2018-10-23 NOTE — Progress Notes (Signed)
Orthopedic Tech Progress Note Patient Details:  Aaron [name] 12/26/1965 [id number]  Patient ID: [name], male   DOB: 1966/03/09, 53 y.o.   MRN: [id number] I was called to apply cpm to pt. When I got to the room the pt was in a bed that would not fit the cpm. The pt had been but into a new bed. I alerted the rn to this.     Trinna Post 10/23/2018, 4:07 PM

## 2018-10-23 NOTE — Progress Notes (Signed)
PROGRESS NOTE    Aaron [name]  FAO:[id number] DOB: 04-May-1966 DOA: 10/01/2018 PCP: [facility] [place]     Brief Narrative:  Aaron [name] is a 53 y.o. male with significant past medical history other than obesity who previously worked as a Radiation protection practitioner presented to [facility] on March 5 with anasarca and 100 pound weight gain.  He also reported abdominal pain/intermittent diarrhea and bloating on admission which he related to?  IBS.  Wife apparently expressed concern about mild confusion at home at the time of admission. In the ED patient was noted to be tachycardic, labs revealed AKI with BUN of 53, creatinine 1.4, his LFTs were slightly elevated in 200s and BNP was elevated at 2769.  CT abdomen/pelvis showed diffuse anasarca, ascites and small bilateral pleural effusions.  Admission EKG showed normal sinus rhythm with T wave inversions in inferolateral leads.  Patient was admitted for further work-up of anasarca. Echocardiogram revealed EF of 15% with diffuse hypokinesis with left atrial moderately dilated, moderate MR and mild AR.  Patient was managed with Coreg and Lasix IV/milrinone drip/Spironolactone.  Cardiology was consulted. Hospital course was complicated by A. fib with RVR requiring heparin drip. Patient subsequently underwent cardiac cath on March 13 which revealed severe three-vessel CAD (Moderate mid LAD disease, tight distal lesion beyond reach of IMA. Severe disease in circumflex. RCA totally occluded). PCI of LAD/LCx versus CABG was considered but cardiothoracic surgery evaluated patient and did not feel he is candidate for CABG, recommended aggressive medical therapy.  Patient was maintained on aspirin, statins and Heparin.  On March 18 patient developed pain in his right thigh and noticed a hard swelling for which CT lower extremity was obtained revealing a hematoma.  Orthopedics was consulted who recommended nonoperative management of hematoma and felt patient  might have resultant temporary femoral nerve paralysis.  Hospital course also complicated by upper GI bleed around the same time prompting GI evaluation.  In view of hypotension/anemia, hematoma and upper GI bleed, heparin was held, PCI deferred and patient underwent EGD on 3/22 revealing small esophageal ulcers, gastritis.  Serial x-rays and CT abdomen suggestive of adynamic ileus/bowel obstruction.  NG tube placed. Patient transferred back to [facility] due to ongoing multiple medical conditions.   New events last 24 hours / Subjective: Tolerated clear liquid diet, no nausea or vomiting, no abdominal pain. Passing some gas. No BM today.   Assessment & Plan:   Principal Problem:   Acute systolic CHF (congestive heart failure) (HCC) Active Problems:   Anasarca   Elevated LFTs   AKI (acute kidney injury) (HCC)   Acute hypoxemic respiratory failure (HCC)   Morbid obesity (HCC)   Atrial fibrillation with rapid ventricular response (HCC)   Shortness of breath   Sinus tachycardia   Tachypnea   Hypokalemia   Ileus/small bowel obstruction: Tolerated NGT clamp trial. Repeat AXR 3/26 still showing ileus. Will leave in NGT for now, continue clear liquid diet, repeat AXR tomorrow prior to removing NGT. Patient very hesitant regarding NGT removal since this is already his second NGT.   Acute blood loss anemia/upper GI bleed: S/p EGD 3/22 showing grade C esophagitis, nonbleeding esophageal ulcers and nonbleeding erosive gastropathy.  Currently on PPI.  Monitor hemoglobin and would transfuse less than 7.5 (upper threshold given cardiac arrhythmia and low EF close ).  Hemoglobin stable at 8.1 today.  Right thigh hematoma: Seen by orthopedics who recommended Bledsoe brace.  PT following.  Clinically improved with no swelling currently.  Hemoglobin  stable and improved.  Patient noted to be on compressive wraps on lower extremities (below the knee) but denies any history of leg wounds.   Multivessel CAD:  Currently off antiplatelet agents/anticoagulants given GI bleed and right thigh hematoma.  Patient however has significant heart disease and understands the risks of continuing blood thinners versus holding.  Would consider enteric-coated aspirin if and when okay with GI/orthopedics and monitor serial hemoglobins.  Acute systolic CHF: Patient has significant cardiomyopathy with EF of 10 to 15%.  Required milrinone drip/IV diuretics in the initial hospital course. Continue digoxin, cozaar, aldactone.   AKI: Present on admission.  Resolved now.   New onset atrial fibrillation/NSVT: In the setting of left atrial enlargement and low EF on echo.  Cardiology following along.  Patient on amiodarone/digoxin.  Not on anticoagulation.   Morbid obesity: Body mass index is 36.76 kg/m.   Tobacco abuse: Nicotine patch    DVT prophylaxis: SCD Code Status: Full Family Communication: Spoke with wife over the phone 3/27 Disposition Plan: CIR once ileus better    Consultants:   Cardiology/advanced heart failure team  Cardiothoracic surgery  Orthopedic surgery  GI  Antimicrobials:  Anti-infectives (From admission, onward)   Start     Dose/Rate Route Frequency Ordered Stop   10/01/18 1515  cefTRIAXone (ROCEPHIN) 1 g in sodium chloride 0.9 % 100 mL IVPB     1 g 200 mL/hr over 30 Minutes Intravenous  Once 10/01/18 1502 10/01/18 2311   10/01/18 1515  azithromycin (ZITHROMAX) tablet 500 mg     500 mg Oral  Once 10/01/18 1502 10/01/18 1510       Objective: Vitals:   10/23/18 0019 10/23/18 0355 10/23/18 0450 10/23/18 0758  BP: 122/72  125/74 122/79  Pulse: 93 89 92 97  Resp: 20 20 20  (!) 22  Temp: 97.8 F (36.6 C)  98 F (36.7 C) (!) 97.4 F (36.3 C)  TempSrc: Oral  Oral Oral  SpO2: 97% 97% 98% 97%  Weight:   (!) 137 kg   Height:        Intake/Output Summary (Last 24 hours) at 10/23/2018 1023 Last data filed at 10/23/2018 0600 Gross per 24 hour  Intake 499 ml  Output 200 ml  Net  299 ml   Filed Weights   10/21/18 0517 10/22/18 0638 10/23/18 0450  Weight: 131.6 kg 129.8 kg (!) 137 kg    Examination: General exam: Appears calm and comfortable  Respiratory system: Clear to auscultation. Respiratory effort normal. Cardiovascular system: S1 & S2 heard, Irreg rhythm. No JVD, murmurs, rubs, gallops or clicks. No pedal edema. Gastrointestinal system: Abdomen is nondistended, soft and nontender. No organomegaly or masses felt. Hypoactive bowel sounds  Central nervous system: Alert and oriented. No focal neurological deficits. Extremities: Symmetric 5 x 5 power. Skin: No rashes, lesions or ulcers Psychiatry: Judgement and insight appear normal. Mood & affect appropriate.    Data Reviewed: I have personally reviewed following labs and imaging studies  CBC: Recent Labs  Lab 10/20/18 1600 10/21/18 0532 10/21/18 0623 10/22/18 0423 10/23/18 0340  WBC 9.2 10.9* 12.5* 13.8* 14.3*  HGB 7.6* 7.3* 8.0* 7.7* 8.1*  HCT 24.3* 24.5* 25.2* 24.2* 24.7*  MCV 90.7 93.2 92.6 93.4 89.2  PLT 318 315 339 319 314   Basic Metabolic Panel: Recent Labs  Lab 10/19/18 0501 10/20/18 0437 10/21/18 0532 10/22/18 0423 10/23/18 0340  NA 133* 134* 134* 133* 133*  K 3.7 3.4* 3.2* 3.4* 3.8  CL 101 101 103 101 104  CO2 23 23 25 23 24   GLUCOSE 117* 135* 110* 143* 131*  BUN 37* 41* 34* 27* 23*  CREATININE 1.53* 1.53* 1.21 0.99 1.00  CALCIUM 7.6* 7.7* 7.3* 7.8* 7.6*   GFR: Estimated Creatinine Clearance: 127.7 mL/min (by C-G formula based on SCr of 1 mg/dL). Liver Function Tests: No results for input(s): AST, ALT, ALKPHOS, BILITOT, PROT, ALBUMIN in the last 168 hours. No results for input(s): LIPASE, AMYLASE in the last 168 hours. No results for input(s): AMMONIA in the last 168 hours. Coagulation Profile: No results for input(s): INR, PROTIME in the last 168 hours. Cardiac Enzymes: No results for input(s): CKTOTAL, CKMB, CKMBINDEX, TROPONINI in the last 168 hours. BNP (last 3  results) No results for input(s): PROBNP in the last 8760 hours. HbA1C: No results for input(s): HGBA1C in the last 72 hours. CBG: No results for input(s): GLUCAP in the last 168 hours. Lipid Profile: No results for input(s): CHOL, HDL, LDLCALC, TRIG, CHOLHDL, LDLDIRECT in the last 72 hours. Thyroid Function Tests: No results for input(s): TSH, T4TOTAL, FREET4, T3FREE, THYROIDAB in the last 72 hours. Anemia Panel: No results for input(s): VITAMINB12, FOLATE, FERRITIN, TIBC, IRON, RETICCTPCT in the last 72 hours. Sepsis Labs: No results for input(s): PROCALCITON, LATICACIDVEN in the last 168 hours.  No results found for this or any previous visit (from the past 240 hour(s)).     Radiology Studies: Dg Abd 1 View  Result Date: 10/22/2018 CLINICAL DATA:  Small-bowel obstruction. EXAM: ABDOMEN - 1 VIEW COMPARISON:  10/19/2018; 10/17/2018; CT abdomen pelvis-10/19/2018 FINDINGS: Enteric tube tip and side port project over the expected location of the gastric antrum. Grossly unchanged moderate to marked gaseous distention of several loops of large and small bowel with index loop of small bowel within the right mid hemiabdomen measuring 6.4 cm in diameter, grossly unchanged. Nondiagnostic evaluation for pneumoperitoneum secondary to exclusion of the lower thorax. No definite pneumatosis or portal venous gas. No acute osseous abnormalities. IMPRESSION: Similar findings most suggestive of ileus. Electronically Signed   By: Simonne Come M.D.   On: 10/22/2018 08:05      Scheduled Meds:  digoxin  0.125 mg Oral Daily   enoxaparin (LOVENOX) injection  30 mg Subcutaneous Q24H   hydrocortisone  25 mg Rectal BID   losartan  25 mg Oral Daily   mouth rinse  15 mL Mouth Rinse BID   nicotine  21 mg Transdermal Daily   pantoprazole (PROTONIX) IV  40 mg Intravenous Q12H   sodium chloride flush  10-40 mL Intracatheter Q12H   sodium chloride flush  3 mL Intravenous Q12H   spironolactone  12.5 mg  Oral Daily   Continuous Infusions:  sodium chloride     amiodarone 30 mg/hr (10/23/18 0644)     LOS: 22 days    Time spent: 25 minutes   Noralee Stain, DO [facility] [email or web address] 10/23/2018, 10:23 AM

## 2018-10-23 NOTE — Progress Notes (Signed)
Inpatient Rehab Admissions Coordinator:   [facility] continuing to follow.  Per chart review, pt not medically ready at this time.  Will follow up next week for possible rehab admission.   Estill Dooms, PT, DPT Admissions Coordinator [phone] 10/23/18  9:28 AM

## 2018-10-23 NOTE — Progress Notes (Signed)
[facility] Progress Note  Aaron [name] 53 y.o. [birth year]   Subjective: Working with PT in tilt bed. Tolerated jello and water yesterday. NGT clamped. Denies N/V. Loose BM overnight.  Objective: Vital signs: Vitals:   10/23/18 0450 10/23/18 0758  BP: 125/74 122/79  Pulse: 92 97  Resp: 20 (!) 22  Temp: 98 F (36.7 C) (!) 97.4 F (36.3 C)  SpO2: 98% 97%    Physical Exam: Gen: lethargic, no acute distress  HEENT: +icteric sclera CV: RRR Chest: CTA B Abd: +distention, nontender, +BS  Lab Results: Recent Labs    10/22/18 0423 10/23/18 0340  NA 133* 133*  K 3.4* 3.8  CL 101 104  CO2 23 24  GLUCOSE 143* 131*  BUN 27* 23*  CREATININE 0.99 1.00  CALCIUM 7.8* 7.6*   No results for input(s): AST, ALT, ALKPHOS, BILITOT, PROT, ALBUMIN in the last 72 hours. Recent Labs    10/22/18 0423 10/23/18 0340  WBC 13.8* 14.3*  HGB 7.7* 8.1*  HCT 24.2* 24.7*  MCV 93.4 89.2  PLT 319 314      Assessment/Plan: Ileus - slowly improving. Moving bowels. Recheck Xray tomorrow and d/c NGT pending Xray findings. Continue clears. Dr. Marca Ancona to f/u tomorrow.   Aaron [name] 10/23/2018, 10:06 AM  Questions please call [name], male   DOB: [birth year], 53 y.o.   MRN: [id number]

## 2018-10-23 NOTE — Progress Notes (Signed)
Physical Therapy Treatment Patient Details Name: Aaron [name] MRN: [id number] DOB: 12-May-1966 Today's Date: 10/23/2018    History of Present Illness Pt is a 53 y.o. male admitted 10/01/18 with progressive fluid retention, abdominal/BLE swelling. Worked up for anasarca and CHF with EF 15%. Transferred to [facility] for cardiac cath 3/13, found to have multivessel CAD; not a candidate for CABG. CT 3/19 with acute R thigh hematoma; planned PCI deferred secondary to this. Pt with positive gastric occult blood; KUB shows ileus. PMH includes obesity.    PT Comments    Pt admitted with above diagnosis. Pt currently with functional limitations due to the deficits listed below (see PT Problem List). Pt was able to stand for 10 minutes in tilt bed.  Pt tolerated this but was fatigued after 10 min.  Progressing.   Pt will benefit from skilled PT to increase their independence and safety with mobility to allow discharge to the venue listed below.     Follow Up Recommendations  CIR;Supervision/Assistance - 24 hour     Equipment Recommendations  None recommended by PT    Recommendations for Other Services       Precautions / Restrictions Precautions Precautions: Fall Precaution Comments: monitor O2 and HR, now needs Bledsoe brace locked in extension R LE when getting up, Vital Go tilt bed Required Braces or Orthoses: Other Brace Other Brace: bledsoe R LE when getting up  Restrictions Weight Bearing Restrictions: No RLE Weight Bearing: Weight bearing as tolerated    Mobility  Bed Mobility Overal bed mobility: Needs Assistance Bed Mobility: Rolling;Supine to Sit;Sit to Supine Rolling: Min assist;+2 for physical assistance         General bed mobility comments: scoots up in bed with min assist  Transfers Overall transfer level: Needs assistance Equipment used: (Vital go Tilt bed) Transfers: Sit to/from Stand Sit to Stand: From elevated surface;Total assist;+2 physical assistance          General transfer comment: Progressively inclined Vital go tilt bed to 75 degrees.  Pt tolerated for 10 minutes total.  BP was 103/74 initially at 60 degree tilt.  Then was 92/63 at 75 degrree tilt.  BP was 105/73  at end of treatment   Asked nursing to tilt pt again.    Ambulation/Gait         Gait velocity: decreased       Stairs             Wheelchair Mobility    Modified Rankin (Stroke Patients Only)       Balance           Standing balance support: No upper extremity supported;During functional activity Standing balance-Leahy Scale: Zero Standing balance comment: supported by Vital Go tilt bed                            Cognition Arousal/Alertness: Awake/alert Behavior During Therapy: WFL for tasks assessed/performed Overall Cognitive Status: Within Functional Limits for tasks assessed                                        Exercises General Exercises - Lower Extremity Quad Sets: AROM;Both;Standing;15 reps Gluteal Sets: AROM;Both;15 reps;Standing    General Comments        Pertinent Vitals/Pain Pain Assessment: Faces Faces Pain Scale: Hurts little more Pain Location: abdomen Pain Descriptors / Indicators: Constant;Discomfort;Grimacing Pain Intervention(s):  Limited activity within patient's tolerance;Monitored during session;Repositioned    Home Living                      Prior Function            PT Goals (current goals can now be found in the care plan section) Acute Rehab PT Goals Patient Stated Goal: "Get over this and work again" Progress towards PT goals: Progressing toward goals    Frequency    Min 3X/week      PT Plan Current plan remains appropriate    Co-evaluation              AM-PAC PT "6 Clicks" Mobility   Outcome Measure  Help needed turning from your back to your side while in a flat bed without using bedrails?: A Lot Help needed moving from lying on your back to  sitting on the side of a flat bed without using bedrails?: A Lot Help needed moving to and from a bed to a chair (including a wheelchair)?: Total Help needed standing up from a chair using your arms (e.g., wheelchair or bedside chair)?: Total Help needed to walk in hospital room?: Total Help needed climbing 3-5 steps with a railing? : Total 6 Click Score: 8    End of Session Equipment Utilized During Treatment: Oxygen;Gait belt(4LO2 with sats 88-92%) Activity Tolerance: Patient tolerated treatment well Patient left: in bed;with call bell/phone within reach(bed in chair position) Nurse Communication: Mobility status PT Visit Diagnosis: Muscle weakness (generalized) (M62.81);Difficulty in walking, not elsewhere classified (R26.2);Pain Pain - part of body: (abdomen )     Time: 8850-2774 PT Time Calculation (min) (ACUTE ONLY): 38 min  Charges:  $Gait Training: 8-22 mins $Therapeutic Exercise: 8-22 mins $Therapeutic Activity: 8-22 mins                     Aaron [name],PT Acute Rehabilitation Services Pager:  [phone]  Office:  [phone]     Aaron [name] 10/23/2018, 12:06 PM

## 2018-10-23 NOTE — Progress Notes (Signed)
Advanced Heart Failure Rounding Note   Subjective:    EGD 3/22       - LA Grade C esophagitis.                           - Non-bleeding esophageal ulcers.                           - Non-bleeding erosive gastropathy.                           - Erythematous and nodular mucosa in    Did ok with NGT clamping. Trying to advance diet.   Remains in AF on IV amio. Rate controlled  Off AC due to leg hematoma and hematemesis.  No SOB, orthopnea, CP.  CVP 5 (checked personally)  Co-ox 75% Tolerating lovenox at DVT dose. HGB stable   Objective:   Weight Range:  Vital Signs:   Temp:  [97.4 F (36.3 C)-98 F (36.7 C)] 97.4 F (36.3 C) (03/27 0758) Pulse Rate:  [89-103] 97 (03/27 0758) Resp:  [12-22] 22 (03/27 0758) BP: (112-125)/(72-88) 122/79 (03/27 0758) SpO2:  [95 %-98 %] 97 % (03/27 0758) Weight:  [579 kg] 137 kg (03/27 0450) Last BM Date: 10/22/18  Weight change: Filed Weights   10/21/18 0517 10/22/18 0638 10/23/18 0450  Weight: 131.6 kg 129.8 kg (!) 137 kg   Intake/Output:   Intake/Output Summary (Last 24 hours) at 10/23/2018 0851 Last data filed at 10/23/2018 0600 Gross per 24 hour  Intake 499 ml  Output 200 ml  Net 299 ml    Physical Exam   General:  Obese male lying in bed  No resp difficulty HEENT: normal x for NGT  Neck: supple. no JVD. Carotids 2+ bilat; no bruits. No lymphadenopathy or thryomegaly appreciated. Cor: PMI nondisplaced. Irregular rate & rhythm. No rubs, gallops or murmurs. Lungs: clear Abdomen: Obese soft, nontender, nondistended. No hepatosplenomegaly. No bruits or masses. Good bowel sounds. Extremities: no cyanosis, clubbing, rash, edema  + unna boots. Small hematoma R thigh Neuro: alert & orientedx3, cranial nerves grossly intact. moves all 4 extremities w/o difficulty. Affect pleasant   Telemetry   Afib 80s, personally reviewed.    Labs    Basic Metabolic Panel: Recent Labs  Lab 10/19/18 0501 10/20/18 0437 10/21/18 0532  10/22/18 0423 10/23/18 0340  NA 133* 134* 134* 133* 133*  K 3.7 3.4* 3.2* 3.4* 3.8  CL 101 101 103 101 104  CO2 23 23 25 23 24   GLUCOSE 117* 135* 110* 143* 131*  BUN 37* 41* 34* 27* 23*  CREATININE 1.53* 1.53* 1.21 0.99 1.00  CALCIUM 7.6* 7.7* 7.3* 7.8* 7.6*   Liver Function Tests: No results for input(s): AST, ALT, ALKPHOS, BILITOT, PROT, ALBUMIN in the last 168 hours. No results for input(s): LIPASE, AMYLASE in the last 168 hours. No results for input(s): AMMONIA in the last 168 hours.  CBC: Recent Labs  Lab 10/20/18 1600 10/21/18 0532 10/21/18 0623 10/22/18 0423 10/23/18 0340  WBC 9.2 10.9* 12.5* 13.8* 14.3*  HGB 7.6* 7.3* 8.0* 7.7* 8.1*  HCT 24.3* 24.5* 25.2* 24.2* 24.7*  MCV 90.7 93.2 92.6 93.4 89.2  PLT 318 315 339 319 314   Cardiac Enzymes: No results for input(s): CKTOTAL, CKMB, CKMBINDEX, TROPONINI in the last 168 hours.  BNP: BNP (last 3 results) Recent Labs    10/01/18 1337  10/01/18 1720  BNP 2,769.2* 2,333.1*   ProBNP (last 3 results) No results for input(s): PROBNP in the last 8760 hours.  Other results:  Imaging: Dg Abd 1 View  Result Date: 10/22/2018 CLINICAL DATA:  Small-bowel obstruction. EXAM: ABDOMEN - 1 VIEW COMPARISON:  10/19/2018; 10/17/2018; CT abdomen pelvis-10/19/2018 FINDINGS: Enteric tube tip and side port project over the expected location of the gastric antrum. Grossly unchanged moderate to marked gaseous distention of several loops of large and small bowel with index loop of small bowel within the right mid hemiabdomen measuring 6.4 cm in diameter, grossly unchanged. Nondiagnostic evaluation for pneumoperitoneum secondary to exclusion of the lower thorax. No definite pneumatosis or portal venous gas. No acute osseous abnormalities. IMPRESSION: Similar findings most suggestive of ileus. Electronically Signed   By: Simonne Come M.D.   On: 10/22/2018 08:05    Medications:    Scheduled Medications: . digoxin  0.125 mg Intravenous Daily   . enoxaparin (LOVENOX) injection  30 mg Subcutaneous Q24H  . hydrocortisone  25 mg Rectal BID  . mouth rinse  15 mL Mouth Rinse BID  . nicotine  21 mg Transdermal Daily  . pantoprazole (PROTONIX) IV  40 mg Intravenous Q12H  . sodium chloride flush  10-40 mL Intracatheter Q12H  . sodium chloride flush  3 mL Intravenous Q12H  . spironolactone  12.5 mg Oral Daily    Infusions: . sodium chloride    . amiodarone 30 mg/hr (10/23/18 0644)    PRN Medications: sodium chloride, acetaminophen, alum & mag hydroxide-simeth, ondansetron (ZOFRAN) IV, sodium chloride flush, sodium chloride flush, witch hazel-glycerin  Assessment:   Aaron [name] is a 53 y.o. male with h/o of obesity who presented with c/o fluid retention, abdominal swelling, and peripheral edema x 3-4 weeks.  Echo showed new systolic CHF 15%.    CHF team consulted for follow up and treatment. Cath results pending at time of consultation.   Plan/Discussion:    1. Acute systolic CHF -> cardiogenic shock due to iCM - Echo 10/02/18 LVEF 15%, Normal RV, Mod LAE, Mod MR, Mild AI, Mild PI.  - Cath 3/13 showed severe 3v CAD with markedly decompensated hemodynamics - Off milrinone co-ox 75% - Volume status looks much better. CVp 5 - Have been holding diuretics and losartan with hypotension in setting of R thigh hematoma and UGI bleed.  - Will restart losartan today. Continue spiro - Continue digoxin 0.125 mg daily.  Level is approprioate - No b-blocker yet with shock. Likely soon - Continue UNNA boots  2. CAD - Severe 3v CAD as above. Has been seen by Dr. Dorris Fetch ([facility]). Not CABG candidate currently given hemodynamics and lack of mobility. Plan was for PCI of LCX and LAD but given large R leg hematoma now plan for medical management,  - No s/s of ischemia despite physiologic stressors.  - Continue ASA and statin. No b-blocker yet with shock. Plavix stopped   3. Afib with RVR, new - Off milrinone. - Back on IV amio at 30  given NPO status and elevated rates. HR improved - This patients CHA2DS2-VASc Score is at least 3.  - Anticoag on hold now with hematoma and UGI bleeding. Now toelrating DVT dose lovenox.  - May consider Eliquis (or ASA/Plavix) prior to d/c  4. Acute large R thigh hematoma with acute blood loss anemia - Heparin off. Ortho has seen -> appreciate their recs  - Has involvement of rectus femoris muscle and femoral nerve.  - Brace applied. - Hgb stable  at 8.1 . Will continue to follow - Continue SCDs.  - Continue DVT dose lovenox  5.  Acute ileus/SBO with Upper GI bleed - GI amd TRH managing (thank you) - EGD results 3/22 and CT scan 3/23 results  noted.  - suspect ileus in setting of systemic illness. Clinically improving but KUB still with persistent ileus  - Can consider Reglan as needed  4. AKI - Resolved  5. Hypokalemia - K 3.8 today. Being supped.   6. Severe deconditioning due to obesity - PT and CIR following.    I will see again on Monday. Please call with questions over the weekend  Length of Stay: 22  Arvilla Meres, MD 10/23/2018, 8:51 AM  Advanced Heart Failure Team Pager [phone] (M-F; 7a - 4p)  Please contact [facility] for night-coverage after hours (4p -7a ) and weekends on [email or web address]

## 2018-10-24 ENCOUNTER — Inpatient Hospital Stay (HOSPITAL_COMMUNITY): Payer: Medicaid Other

## 2018-10-24 LAB — BASIC METABOLIC PANEL
Anion gap: 8 (ref 5–15)
BUN: 19 mg/dL (ref 6–20)
CO2: 23 mmol/L (ref 22–32)
Calcium: 7.9 mg/dL — ABNORMAL LOW (ref 8.9–10.3)
Chloride: 103 mmol/L (ref 98–111)
Creatinine, Ser: 1.07 mg/dL (ref 0.61–1.24)
GFR calc non Af Amer: >60 mL/min (ref >60)
Glucose, Bld: 105 mg/dL — ABNORMAL HIGH (ref 70–99)
Potassium: 3.8 mmol/L (ref 3.5–5.1)
Sodium: 134 mmol/L — ABNORMAL LOW (ref 135–145)

## 2018-10-24 LAB — CBC
HCT: 26.5 % — ABNORMAL LOW (ref 39.0–52.0)
Hemoglobin: 8.2 g/dL — ABNORMAL LOW (ref 13.0–17.0)
MCH: 27.7 pg (ref 26.0–34.0)
MCHC: 30.9 g/dL (ref 30.0–36.0)
MCV: 89.5 fL (ref 80.0–100.0)
Platelets: 295 10*3/uL (ref 150–400)
RBC: 2.96 MIL/uL — AB (ref 4.22–5.81)
RDW: 17.4 % — ABNORMAL HIGH (ref 11.5–15.5)
WBC: 14.4 10*3/uL — ABNORMAL HIGH (ref 4.0–10.5)
nRBC: 0.1 % (ref 0.0–0.2)

## 2018-10-24 LAB — COOXEMETRY PANEL
Carboxyhemoglobin: 2.2 % — ABNORMAL HIGH (ref 0.5–1.5)
Methemoglobin: 1.7 % — ABNORMAL HIGH (ref 0.0–1.5)
O2 SAT: 66.2 %
Total hemoglobin: 8.3 g/dL — ABNORMAL LOW (ref 12.0–16.0)

## 2018-10-24 MED ORDER — METOCLOPRAMIDE HCL 5 MG/ML IJ SOLN
5.0000 mg | Freq: Three times a day (TID) | INTRAMUSCULAR | Status: DC
  Administered 2018-10-24 – 2018-10-26 (×7): 5 mg via INTRAVENOUS
  Filled 2018-10-24 (×7): qty 2

## 2018-10-24 MED ORDER — ASPIRIN EC 81 MG PO TBEC
81.0000 mg | DELAYED_RELEASE_TABLET | Freq: Every day | ORAL | Status: DC
  Administered 2018-10-24 – 2018-11-05 (×13): 81 mg via ORAL
  Filled 2018-10-24 (×13): qty 1

## 2018-10-24 NOTE — Progress Notes (Addendum)
PROGRESS NOTE    Aaron [name]  MBW:[id number] DOB: 1966/03/09 DOA: 10/01/2018 PCP: [facility] [place]     Brief Narrative:  Aaron [name] is a 53 y.o. male with significant past medical history other than obesity who previously worked as a Radiation protection practitioner presented [facility] on March 5 with anasarca and 100 pound weight gain.  He also reported abdominal pain/intermittent diarrhea and bloating on admission which he related to?  IBS.  Wife apparently expressed concern about mild confusion at home at the time of admission. In the ED patient was noted to be tachycardic, labs revealed AKI with BUN of 53, creatinine 1.4, his LFTs were slightly elevated in 200s and BNP was elevated at 2769.  CT abdomen/pelvis showed diffuse anasarca, ascites and small bilateral pleural effusions.  Admission EKG showed normal sinus rhythm with T wave inversions in inferolateral leads.  Patient was admitted for further work-up of anasarca. Echocardiogram revealed EF of 15% with diffuse hypokinesis with left atrial moderately dilated, moderate MR and mild AR.  Patient was managed with Coreg and Lasix IV/milrinone drip/Spironolactone.  Cardiology was consulted. Hospital course was complicated by A. fib with RVR requiring heparin drip. Patient subsequently underwent cardiac cath on March 13 which revealed severe three-vessel CAD (Moderate mid LAD disease, tight distal lesion beyond reach of IMA. Severe disease in circumflex. RCA totally occluded). PCI of LAD/LCx versus CABG was considered but cardiothoracic surgery evaluated patient and did not feel he is candidate for CABG, recommended aggressive medical therapy.  Patient was maintained on aspirin, statins and Heparin.  On March 18 patient developed pain in his right thigh and noticed a hard swelling for which CT lower extremity was obtained revealing a hematoma.  Orthopedics was consulted who recommended nonoperative management of hematoma and felt patient  might have resultant temporary femoral nerve paralysis.  Hospital course also complicated by upper GI bleed around the same time prompting GI evaluation.  In view of hypotension/anemia, hematoma and upper GI bleed, heparin was held, PCI deferred and patient underwent EGD on 3/22 revealing small esophageal ulcers, gastritis.  Serial x-rays and CT abdomen suggestive of adynamic ileus/bowel obstruction.  NG tube placed. Patient transferred back to [facility] due to ongoing multiple medical conditions.   New events last 24 hours / Subjective: Had some right sided abdominal soreness. No nausea, vomiting, had a small BM this morning. No appetite.   Assessment & Plan:   Principal Problem:   Acute systolic CHF (congestive heart failure) (HCC) Active Problems:   Anasarca   Elevated LFTs   AKI (acute kidney injury) (HCC)   Acute hypoxemic respiratory failure (HCC)   Morbid obesity (HCC)   Atrial fibrillation with rapid ventricular response (HCC)   Shortness of breath   Sinus tachycardia   Tachypnea   Hypokalemia   Ileus/small bowel obstruction: Tolerated NGT clamp trial. Repeat AXR 3/26 still showing ileus. Repeat AXR 3/28 showed stable colonic and small bowel dilatation concerning for ileus. Tolerating clear liquid diet but no big improvement in last few days. Trial IV reglan today. Patient very hesitant regarding NGT removal since this is already his second NGT.   Acute blood loss anemia/upper GI bleed: S/p EGD 3/22 showing grade C esophagitis, nonbleeding esophageal ulcers and nonbleeding erosive gastropathy.  Currently on PPI.  Monitor hemoglobin and would transfuse less than 7.5 (upper threshold given cardiac arrhythmia and low EF close ).  Hemoglobin stable at 8.2 today.  Right thigh hematoma: Seen by orthopedics who recommended Bledsoe brace.  PT following.  Clinically improved with no swelling currently.  Hemoglobin stable and improved.  Patient noted to be on compressive wraps on lower  extremities (below the knee) but denies any history of leg wounds.   Multivessel CAD: Currently off antiplatelet agents/anticoagulants given GI bleed and right thigh hematoma.  Patient however has significant heart disease and understands the risks of continuing blood thinners versus holding.  Would consider enteric-coated aspirin if and when okay with GI/orthopedics and monitor serial hemoglobins.  Acute systolic CHF: Patient has significant cardiomyopathy with EF of 10 to 15%.  Required milrinone drip/IV diuretics in the initial hospital course. Continue digoxin, cozaar, aldactone.   AKI: Present on admission.  Resolved now.   New onset atrial fibrillation/NSVT: In the setting of left atrial enlargement and low EF on echo.  Cardiology following.  Patient on amiodarone/digoxin.  Not on anticoagulation.   Morbid obesity: Body mass index is 36.64 kg/m.   Tobacco abuse: Nicotine patch   Leukocytosis:  Unclear etiology, not on steroids, no clear evidence of infection. Afebrile.    DVT prophylaxis: SCD Code Status: Full Family Communication: Spoke with wife over the phone 3/27 Disposition Plan: CIR once ileus better    Consultants:   Cardiology/advanced heart failure team  Cardiothoracic surgery  Orthopedic surgery  GI  Antimicrobials:  Anti-infectives (From admission, onward)   Start     Dose/Rate Route Frequency Ordered Stop   10/01/18 1515  cefTRIAXone (ROCEPHIN) 1 g in sodium chloride 0.9 % 100 mL IVPB     1 g 200 mL/hr over 30 Minutes Intravenous  Once 10/01/18 1502 10/01/18 2311   10/01/18 1515  azithromycin (ZITHROMAX) tablet 500 mg     500 mg Oral  Once 10/01/18 1502 10/01/18 1510       Objective: Vitals:   10/24/18 0000 10/24/18 0332 10/24/18 0400 10/24/18 0732  BP: 113/69 116/66  107/63  Pulse: 96 100 90 90  Resp: 20 (!) 23 (!) 24 20  Temp: 97.8 F (36.6 C) 98.1 F (36.7 C)  (!) 97.5 F (36.4 C)  TempSrc: Oral Oral  Oral  SpO2: 96% 96% 97% 96%    Weight:  (!) 136.5 kg    Height:        Intake/Output Summary (Last 24 hours) at 10/24/2018 1030 Last data filed at 10/24/2018 0900 Gross per 24 hour  Intake 872.17 ml  Output 200 ml  Net 672.17 ml   Filed Weights   10/22/18 0638 10/23/18 0450 10/24/18 0332  Weight: 129.8 kg (!) 137 kg (!) 136.5 kg    Examination: General exam: Appears calm and comfortable  Respiratory system: Clear to auscultation. Respiratory effort normal. Cardiovascular system: S1 & S2 heard, Irreg rhythm. No JVD, murmurs, rubs, gallops or clicks.  Gastrointestinal system: Abdomen is nondistended, soft and nontender. No organomegaly or masses felt. High pitch bowel sounds heard.  Central nervous system: Alert and oriented. No focal neurological deficits. Extremities: Symmetric  Skin: No rashes, lesions or ulcers Psychiatry: Judgement and insight appear normal. Mood & affect appropriate.    Data Reviewed: I have personally reviewed following labs and imaging studies  CBC: Recent Labs  Lab 10/21/18 0532 10/21/18 0623 10/22/18 0423 10/23/18 0340 10/24/18 0333  WBC 10.9* 12.5* 13.8* 14.3* 14.4*  HGB 7.3* 8.0* 7.7* 8.1* 8.2*  HCT 24.5* 25.2* 24.2* 24.7* 26.5*  MCV 93.2 92.6 93.4 89.2 89.5  PLT 315 339 319 314 295   Basic Metabolic Panel: Recent Labs  Lab 10/20/18 0437 10/21/18 0532 10/22/18 0423 10/23/18  0340 10/24/18 0333  NA 134* 134* 133* 133* 134*  K 3.4* 3.2* 3.4* 3.8 3.8  CL 101 103 101 104 103  CO2 23 25 23 24 23   GLUCOSE 135* 110* 143* 131* 105*  BUN 41* 34* 27* 23* 19  CREATININE 1.53* 1.21 0.99 1.00 1.07  CALCIUM 7.7* 7.3* 7.8* 7.6* 7.9*   GFR: Estimated Creatinine Clearance: 119.1 mL/min (by C-G formula based on SCr of 1.07 mg/dL). Liver Function Tests: No results for input(s): AST, ALT, ALKPHOS, BILITOT, PROT, ALBUMIN in the last 168 hours. No results for input(s): LIPASE, AMYLASE in the last 168 hours. No results for input(s): AMMONIA in the last 168 hours. Coagulation  Profile: No results for input(s): INR, PROTIME in the last 168 hours. Cardiac Enzymes: No results for input(s): CKTOTAL, CKMB, CKMBINDEX, TROPONINI in the last 168 hours. BNP (last 3 results) No results for input(s): PROBNP in the last 8760 hours. HbA1C: No results for input(s): HGBA1C in the last 72 hours. CBG: No results for input(s): GLUCAP in the last 168 hours. Lipid Profile: No results for input(s): CHOL, HDL, LDLCALC, TRIG, CHOLHDL, LDLDIRECT in the last 72 hours. Thyroid Function Tests: No results for input(s): TSH, T4TOTAL, FREET4, T3FREE, THYROIDAB in the last 72 hours. Anemia Panel: No results for input(s): VITAMINB12, FOLATE, FERRITIN, TIBC, IRON, RETICCTPCT in the last 72 hours. Sepsis Labs: No results for input(s): PROCALCITON, LATICACIDVEN in the last 168 hours.  No results found for this or any previous visit (from the past 240 hour(s)).     Radiology Studies: Dg Abd 1 View  Result Date: 10/24/2018 CLINICAL DATA:  Ileus. EXAM: ABDOMEN - 1 VIEW COMPARISON:  Radiographs of October 22, 2018. FINDINGS: Stable position of nasogastric tube with tip in expected position of distal stomach. Stable colonic dilatation is noted suggesting ileus. Minimal to mild small bowel dilatation is seen in the left side of the abdomen. IMPRESSION: Stable position of the nasogastric tube. Grossly stable colonic and small bowel dilatation is noted concerning for ileus. Electronically Signed   By: Lupita Raider, M.D.   On: 10/24/2018 08:29      Scheduled Meds:  aspirin EC  81 mg Oral Daily   digoxin  0.125 mg Oral Daily   enoxaparin (LOVENOX) injection  30 mg Subcutaneous Q24H   hydrocortisone  25 mg Rectal BID   losartan  25 mg Oral Daily   mouth rinse  15 mL Mouth Rinse BID   metoCLOPramide (REGLAN) injection  5 mg Intravenous Q8H   nicotine  21 mg Transdermal Daily   pantoprazole (PROTONIX) IV  40 mg Intravenous Q12H   sodium chloride flush  10-40 mL Intracatheter Q12H    sodium chloride flush  3 mL Intravenous Q12H   spironolactone  12.5 mg Oral Daily   Continuous Infusions:  sodium chloride     amiodarone 30 mg/hr (10/24/18 0622)     LOS: 23 days    Time spent: 25 minutes   Noralee Stain, DO [facility] [email or web address] 10/24/2018, 10:30 AM

## 2018-10-24 NOTE — Progress Notes (Addendum)
Aaron [name] Andon 12:06 PM  Subjective: Patient seen and discussed with Dr. Bosie Clos and his hospital computer chart reviewed and he has not had any vomiting and has moved his bowels a little and his case discussed with his nurse as well and he has no new complaints but is not getting up and walking however he is standing some and is tolerating clear liquids  Objective: Vital signs stable afebrile no acute distress abdomen is soft nontender occasional bowel sounds not obviously distended potassium 3.8 CBC stable abdominal x-ray stable  Assessment: Ileus seemingly mild currently  Plan: Would recommend potassium greater than 4.0 and will check a magnesium and phosphorus tomorrow as well and allow the hospital team to adjusted as needed otherwise please call me this weekend if I can be of any further assistance and agree with a trial of Reglan and if that is unsuccessful over a few days may try erythromycin next  [facility] E  Pager [phone] After 5PM or if no answer call [phone]

## 2018-10-24 NOTE — Plan of Care (Signed)
  Problem: Clinical Measurements: Goal: Ability to maintain clinical measurements within normal limits will improve Outcome: Progressing   

## 2018-10-25 LAB — CBC
HCT: 24.5 % — ABNORMAL LOW (ref 39.0–52.0)
Hemoglobin: 7.9 g/dL — ABNORMAL LOW (ref 13.0–17.0)
MCH: 28.9 pg (ref 26.0–34.0)
MCHC: 32.2 g/dL (ref 30.0–36.0)
MCV: 89.7 fL (ref 80.0–100.0)
NRBC: 0 % (ref 0.0–0.2)
Platelets: 260 10*3/uL (ref 150–400)
RBC: 2.73 MIL/uL — ABNORMAL LOW (ref 4.22–5.81)
RDW: 17.8 % — AB (ref 11.5–15.5)
WBC: 14.5 10*3/uL — ABNORMAL HIGH (ref 4.0–10.5)

## 2018-10-25 LAB — COOXEMETRY PANEL
CARBOXYHEMOGLOBIN: 2 % — AB (ref 0.5–1.5)
Methemoglobin: 1.7 % — ABNORMAL HIGH (ref 0.0–1.5)
O2 Saturation: 62.8 %
Total hemoglobin: 8.5 g/dL — ABNORMAL LOW (ref 12.0–16.0)

## 2018-10-25 LAB — BASIC METABOLIC PANEL
Anion gap: 6 (ref 5–15)
BUN: 18 mg/dL (ref 6–20)
CO2: 25 mmol/L (ref 22–32)
Calcium: 7.7 mg/dL — ABNORMAL LOW (ref 8.9–10.3)
Chloride: 103 mmol/L (ref 98–111)
Creatinine, Ser: 0.94 mg/dL (ref 0.61–1.24)
GFR calc Af Amer: >60 mL/min (ref >60)
GFR calc non Af Amer: >60 mL/min (ref >60)
Glucose, Bld: 105 mg/dL — ABNORMAL HIGH (ref 70–99)
Potassium: 3.2 mmol/L — ABNORMAL LOW (ref 3.5–5.1)
Sodium: 134 mmol/L — ABNORMAL LOW (ref 135–145)

## 2018-10-25 LAB — PHOSPHORUS: Phosphorus: 2.7 mg/dL (ref 2.5–4.6)

## 2018-10-25 LAB — MAGNESIUM: Magnesium: 2 mg/dL (ref 1.7–2.4)

## 2018-10-25 MED ORDER — POTASSIUM CHLORIDE 10 MEQ/100ML IV SOLN
10.0000 meq | INTRAVENOUS | Status: AC
  Administered 2018-10-25 (×6): 10 meq via INTRAVENOUS
  Filled 2018-10-25 (×6): qty 100

## 2018-10-25 MED ORDER — POLYETHYLENE GLYCOL 3350 17 G PO PACK
17.0000 g | PACK | Freq: Every day | ORAL | Status: DC
  Administered 2018-10-25 – 2018-10-28 (×4): 17 g via ORAL
  Filled 2018-10-25 (×5): qty 1

## 2018-10-25 NOTE — Progress Notes (Signed)
Nawaf Sauve 11:19 AM  Subjective: Patient doing a little better than yesterday and agree with hospital team's note and no new complaints and did have a bowel movement tolerating clear liquid diet  Objective: Vital signs stable afebrile no acute distress abdomen is soft nontender decreased potassium primary team giving him runs of K  Assessment: Ileus seemingly improved  Plan: We will asked team to check on tomorrow but probably can remove NG tube soon although patient hesitant to let us  [facility] E  Pager [phone] After 5PM or if no answer call [phone]

## 2018-10-25 NOTE — Progress Notes (Signed)
   10/25/18 1409  PT Visit Information  Last PT Received On 10/25/18  Assistance Needed +2  History of Present Illness Pt is a 53 y.o. male admitted 10/01/18 with progressive fluid retention, abdominal/BLE swelling. Worked up for anasarca and CHF with EF 15%. Transferred to [facility] for cardiac cath 3/13, found to have multivessel CAD; not a candidate for CABG. CT 3/19 with acute R thigh hematoma; planned PCI deferred secondary to this. Pt with positive gastric occult blood; KUB shows ileus. PMH includes obesity.  Subjective Data  Patient Stated Goal "Get over this and work again"  Precautions  Precautions Fall  Precaution Comments monitor O2 and HR, now needs Bledsoe brace locked in extension R LE when getting up, Vital Go tilt bed  Required Braces or Orthoses Other Brace  Other Brace bledsoe R LE when getting up   Restrictions  Weight Bearing Restrictions No  RLE Weight Bearing WBAT  Pain Assessment  Pain Assessment Faces  Faces Pain Scale 4  Pain Location abdomen  Pain Descriptors / Indicators Constant;Discomfort;Grimacing  Pain Intervention(s) Limited activity within patient's tolerance;Monitored during session;Repositioned  Cognition  Arousal/Alertness Awake/alert  Behavior During Therapy WFL for tasks assessed/performed  Overall Cognitive Status Within Functional Limits for tasks assessed  Bed Mobility  Overal bed mobility Needs Assistance  General bed mobility comments scoots up in bed with no assist  General Exercises - Lower Extremity  Ankle Circles/Pumps AROM;Both;Supine;20 reps  Quad Sets AROM;Both;Standing;15 reps  PT - End of Session  Equipment Utilized During Treatment Oxygen;Gait belt (3LO2 with sats 90-92%)  Activity Tolerance Patient tolerated treatment well  Patient left in bed;with call bell/phone within reach (bed in chair position)  Nurse Communication Mobility status  CPM Right Knee  CPM Right Knee Off   PT - Assessment/Plan  PT Plan Current plan remains  appropriate  PT Visit Diagnosis Muscle weakness (generalized) (M62.81);Difficulty in walking, not elsewhere classified (R26.2);Pain  Pain - part of body  (abdomen )  PT Frequency (ACUTE ONLY) Min 3X/week  Follow Up Recommendations CIR;Supervision/Assistance - 24 hour  PT equipment None recommended by PT  AM-PAC PT "6 Clicks" Mobility Outcome Measure (Version 2)  Help needed turning from your back to your side while in a flat bed without using bedrails? 2  Help needed moving from lying on your back to sitting on the side of a flat bed without using bedrails? 2  Help needed moving to and from a bed to a chair (including a wheelchair)? 1  Help needed standing up from a chair using your arms (e.g., wheelchair or bedside chair)? 1  Help needed to walk in hospital room? 1  Help needed climbing 3-5 steps with a railing?  1  6 Click Score 8  Consider Recommendation of Discharge To: CIR/SNF/LTACH  PT Goal Progression  Progress towards PT goals Progressing toward goals  PT Time Calculation  PT Start Time (ACUTE ONLY) 1111  PT Stop Time (ACUTE ONLY) 1120  PT Time Calculation (min) (ACUTE ONLY) 9 min  PT General Charges  $$ ACUTE PT VISIT 1 Visit  PT Treatments  $Therapeutic Activity 8-22 mins  Came back and removed CPM.  Positioned pt in bed in chair position as well.   Kristion Holifield,PT Acute Rehabilitation Services Pager:  [phone]  Office:  [phone]

## 2018-10-25 NOTE — Plan of Care (Signed)
  Problem: Clinical Measurements: Goal: Ability to maintain clinical measurements within normal limits will improve Outcome: Progressing Goal: Will remain free from infection Outcome: Progressing Goal: Cardiovascular complication will be avoided Outcome: Progressing   Problem: Activity: Goal: Risk for activity intolerance will decrease Outcome: Progressing   Problem: Nutrition: Goal: Adequate nutrition will be maintained Outcome: Progressing   

## 2018-10-25 NOTE — Progress Notes (Signed)
PROGRESS NOTE    Courtlan Abdulrahman  MRA:[id number] DOB: October 23, 1965 DOA: 10/01/2018 PCP: [facility] [place]     Brief Narrative:  Aaron [name] is a 53 y.o. male with significant past medical history other than obesity who previously worked as a Radiation protection practitioner presented [facility] on March 5 with anasarca and 100 pound weight gain.  He also reported abdominal pain/intermittent diarrhea and bloating on admission which he related to?  IBS.  Wife apparently expressed concern about mild confusion at home at the time of admission. In the ED patient was noted to be tachycardic, labs revealed AKI with BUN of 53, creatinine 1.4, his LFTs were slightly elevated in 200s and BNP was elevated at 2769.  CT abdomen/pelvis showed diffuse anasarca, ascites and small bilateral pleural effusions.  Admission EKG showed normal sinus rhythm with T wave inversions in inferolateral leads.  Patient was admitted for further work-up of anasarca. Echocardiogram revealed EF of 15% with diffuse hypokinesis with left atrial moderately dilated, moderate MR and mild AR.  Patient was managed with Coreg and Lasix IV/milrinone drip/Spironolactone.  Cardiology was consulted. Hospital course was complicated by A. fib with RVR requiring heparin drip. Patient subsequently underwent cardiac cath on March 13 which revealed severe three-vessel CAD (Moderate mid LAD disease, tight distal lesion beyond reach of IMA. Severe disease in circumflex. RCA totally occluded). PCI of LAD/LCx versus CABG was considered but cardiothoracic surgery evaluated patient and did not feel he is candidate for CABG, recommended aggressive medical therapy.  Patient was maintained on aspirin, statins and Heparin.  On March 18 patient developed pain in his right thigh and noticed a hard swelling for which CT lower extremity was obtained revealing a hematoma.  Orthopedics was consulted who recommended nonoperative management of hematoma and felt patient  might have resultant temporary femoral nerve paralysis.  Hospital course also complicated by upper GI bleed around the same time prompting GI evaluation.  In view of hypotension/anemia, hematoma and upper GI bleed, heparin was held, PCI deferred and patient underwent EGD on 3/22 revealing small esophageal ulcers, gastritis.  Serial x-rays and CT abdomen suggestive of adynamic ileus/bowel obstruction.  NG tube placed. Patient transferred back to [facility] due to ongoing multiple medical conditions.   New events last 24 hours / Subjective: Continues to tolerate clear liquid diet, had small soft BM last night. No nausea, vomiting. Wants to try full liquid diet today but does not want to remove NGT because of the trauma of re-insertion. Encouraged ambulation.   Assessment & Plan:   Principal Problem:   Acute systolic CHF (congestive heart failure) (HCC) Active Problems:   Anasarca   Elevated LFTs   AKI (acute kidney injury) (HCC)   Acute hypoxemic respiratory failure (HCC)   Morbid obesity (HCC)   Atrial fibrillation with rapid ventricular response (HCC)   Shortness of breath   Sinus tachycardia   Tachypnea   Hypokalemia   Ileus/small bowel obstruction: Tolerated NGT clamp trial. Repeat AXR 3/26 still showing ileus. Repeat AXR 3/28 showed stable colonic and small bowel dilatation concerning for ileus. Started on IV reglan 3/28. Advance to full liquid diet today, repeat AXR 3/30. If improves tomorrow, remove NGT. Patient very hesitant regarding NGT removal since this is already his second NGT.   Acute blood loss anemia/upper GI bleed: S/p EGD 3/22 showing grade C esophagitis, nonbleeding esophageal ulcers and nonbleeding erosive gastropathy.  Currently on PPI.  Monitor hemoglobin and would transfuse less than 7.5 (upper threshold given cardiac  arrhythmia and low EF close ).  Hemoglobin stable at 7.9 today.  Right thigh hematoma: Seen by orthopedics who recommended Bledsoe brace.  PT following.   Clinically improved with no swelling currently.  Hemoglobin stable and improved.  Patient noted to be on compressive wraps on lower extremities (below the knee) but denies any history of leg wounds.   Multivessel CAD: Currently off antiplatelet agents/anticoagulants given GI bleed and right thigh hematoma.  Patient however has significant heart disease and understands the risks of continuing blood thinners versus holding.  Would consider enteric-coated aspirin if and when okay with GI/orthopedics and monitor serial hemoglobins.  Acute systolic CHF: Patient has significant cardiomyopathy with EF of 10 to 15%.  Required milrinone drip/IV diuretics in the initial hospital course. Continue digoxin, cozaar, aldactone.   AKI: Present on admission.  Resolved now.   New onset atrial fibrillation/NSVT: In the setting of left atrial enlargement and low EF on echo.  Cardiology following.  Patient on amiodarone/digoxin.  Not on anticoagulation.   Morbid obesity: Body mass index is 36.7 kg/m.   Tobacco abuse: Nicotine patch   Leukocytosis:  Unclear etiology, not on steroids, no clear evidence of infection. Afebrile. Stable.    DVT prophylaxis: SCD Code Status: Full Family Communication: Spoke with wife over the phone 3/27 Disposition Plan: CIR once ileus better    Consultants:   Cardiology/advanced heart failure team  Cardiothoracic surgery  Orthopedic surgery  GI  Antimicrobials:  Anti-infectives (From admission, onward)   Start     Dose/Rate Route Frequency Ordered Stop   10/01/18 1515  cefTRIAXone (ROCEPHIN) 1 g in sodium chloride 0.9 % 100 mL IVPB     1 g 200 mL/hr over 30 Minutes Intravenous  Once 10/01/18 1502 10/01/18 2311   10/01/18 1515  azithromycin (ZITHROMAX) tablet 500 mg     500 mg Oral  Once 10/01/18 1502 10/01/18 1510       Objective: Vitals:   10/24/18 1931 10/25/18 0019 10/25/18 0500 10/25/18 0747  BP: 100/72 106/69 106/73 109/75  Pulse: 100 90 88 84    Resp: 20 19 (!) 25 (!) 26  Temp: 98.1 F (36.7 C) 97.9 F (36.6 C) 98 F (36.7 C) 97.8 F (36.6 C)  TempSrc: Oral Axillary Oral Oral  SpO2:  98% 97% 98%  Weight:   (!) 136.8 kg   Height:        Intake/Output Summary (Last 24 hours) at 10/25/2018 1051 Last data filed at 10/24/2018 1700 Gross per 24 hour  Intake 360 ml  Output --  Net 360 ml   Filed Weights   10/23/18 0450 10/24/18 0332 10/25/18 0500  Weight: (!) 137 kg (!) 136.5 kg (!) 136.8 kg    Examination: General exam: Appears calm and comfortable  Respiratory system: Clear to auscultation. Respiratory effort normal. Cardiovascular system: S1 & S2 heard, Irreg rhythm. Gastrointestinal system: Abdomen is nondistended, soft and nontender. No organomegaly or masses felt. +bowel sounds heard. +NGT clamped  Central nervous system: Alert and oriented. No focal neurological deficits. Extremities: Symmetric 5 x 5 power. Skin: No rashes, lesions or ulcers Psychiatry: Judgement and insight appear normal. Mood & affect appropriate.    Data Reviewed: I have personally reviewed following labs and imaging studies  CBC: Recent Labs  Lab 10/21/18 0623 10/22/18 0423 10/23/18 0340 10/24/18 0333 10/25/18 0509  WBC 12.5* 13.8* 14.3* 14.4* 14.5*  HGB 8.0* 7.7* 8.1* 8.2* 7.9*  HCT 25.2* 24.2* 24.7* 26.5* 24.5*  MCV 92.6 93.4 89.2 89.5 89.7  PLT 339 319 314 295 260   Basic Metabolic Panel: Recent Labs  Lab 10/21/18 0532 10/22/18 0423 10/23/18 0340 10/24/18 0333 10/25/18 0509  NA 134* 133* 133* 134* 134*  K 3.2* 3.4* 3.8 3.8 3.2*  CL 103 101 104 103 103  CO2 25 23 24 23 25   GLUCOSE 110* 143* 131* 105* 105*  BUN 34* 27* 23* 19 18  CREATININE 1.21 0.99 1.00 1.07 0.94  CALCIUM 7.3* 7.8* 7.6* 7.9* 7.7*  MG  --   --   --   --  2.0  PHOS  --   --   --   --  2.7   GFR: Estimated Creatinine Clearance: 135.7 mL/min (by C-G formula based on SCr of 0.94 mg/dL). Liver Function Tests: No results for input(s): AST, ALT,  ALKPHOS, BILITOT, PROT, ALBUMIN in the last 168 hours. No results for input(s): LIPASE, AMYLASE in the last 168 hours. No results for input(s): AMMONIA in the last 168 hours. Coagulation Profile: No results for input(s): INR, PROTIME in the last 168 hours. Cardiac Enzymes: No results for input(s): CKTOTAL, CKMB, CKMBINDEX, TROPONINI in the last 168 hours. BNP (last 3 results) No results for input(s): PROBNP in the last 8760 hours. HbA1C: No results for input(s): HGBA1C in the last 72 hours. CBG: No results for input(s): GLUCAP in the last 168 hours. Lipid Profile: No results for input(s): CHOL, HDL, LDLCALC, TRIG, CHOLHDL, LDLDIRECT in the last 72 hours. Thyroid Function Tests: No results for input(s): TSH, T4TOTAL, FREET4, T3FREE, THYROIDAB in the last 72 hours. Anemia Panel: No results for input(s): VITAMINB12, FOLATE, FERRITIN, TIBC, IRON, RETICCTPCT in the last 72 hours. Sepsis Labs: No results for input(s): PROCALCITON, LATICACIDVEN in the last 168 hours.  No results found for this or any previous visit (from the past 240 hour(s)).     Radiology Studies: Dg Abd 1 View  Result Date: 10/24/2018 CLINICAL DATA:  Ileus. EXAM: ABDOMEN - 1 VIEW COMPARISON:  Radiographs of October 22, 2018. FINDINGS: Stable position of nasogastric tube with tip in expected position of distal stomach. Stable colonic dilatation is noted suggesting ileus. Minimal to mild small bowel dilatation is seen in the left side of the abdomen. IMPRESSION: Stable position of the nasogastric tube. Grossly stable colonic and small bowel dilatation is noted concerning for ileus. Electronically Signed   By: Lupita Raider, M.D.   On: 10/24/2018 08:29      Scheduled Meds:  aspirin EC  81 mg Oral Daily   digoxin  0.125 mg Oral Daily   enoxaparin (LOVENOX) injection  30 mg Subcutaneous Q24H   hydrocortisone  25 mg Rectal BID   losartan  25 mg Oral Daily   mouth rinse  15 mL Mouth Rinse BID   metoCLOPramide  (REGLAN) injection  5 mg Intravenous Q8H   nicotine  21 mg Transdermal Daily   pantoprazole (PROTONIX) IV  40 mg Intravenous Q12H   sodium chloride flush  10-40 mL Intracatheter Q12H   sodium chloride flush  3 mL Intravenous Q12H   spironolactone  12.5 mg Oral Daily   Continuous Infusions:  sodium chloride     amiodarone 30 mg/hr (10/25/18 0512)   potassium chloride 10 mEq (10/25/18 0902)     LOS: 24 days    Time spent: 25 minutes   Noralee Stain, DO [facility] [email or web address] 10/25/2018, 10:51 AM

## 2018-10-25 NOTE — Progress Notes (Addendum)
Physical Therapy Treatment Patient Details Name: Aaron [name] MRN: [id number] DOB: Jun 25, 1966 Today's Date: 10/25/2018    History of Present Illness Pt is a 53 y.o. male admitted 10/01/18 with progressive fluid retention, abdominal/BLE swelling. Worked up for anasarca and CHF with EF 15%. Transferred to [facility] for cardiac cath 3/13, found to have multivessel CAD; not a candidate for CABG. CT 3/19 with acute R thigh hematoma; planned PCI deferred secondary to this. Pt with positive gastric occult blood; KUB shows ileus. PMH includes obesity.    PT Comments    Pt admitted with above diagnosis. Pt currently with functional limitations due to the deficits listed below (see PT Problem List). Pt was able to tolerate tilt for 11 minutes today.  Pt on 3LO2.  Performs exercise while in tilt bed standing.  Pt asked for PT to apply CPM to right LE therefore did so and will come back and remove in 20 to 30 minutes.   Making excellent progress for Rehab and pt very motivated! Pt will benefit from skilled PT to increase their independence and safety with mobility to allow discharge to the venue listed below.     Follow Up Recommendations  CIR;Supervision/Assistance - 24 hour     Equipment Recommendations  None recommended by PT    Recommendations for Other Services       Precautions / Restrictions Precautions Precautions: Fall Precaution Comments: monitor O2 and HR, now needs Bledsoe brace locked in extension R LE when getting up, Vital Go tilt bed Required Braces or Orthoses: Other Brace Other Brace: bledsoe R LE when getting up  Restrictions Weight Bearing Restrictions: No RLE Weight Bearing: Weight bearing as tolerated    Mobility  Bed Mobility Overal bed mobility: Needs Assistance Bed Mobility: Rolling;Supine to Sit;Sit to Supine Rolling: Min guard         General bed mobility comments: scoots up in bed with no assist  Transfers Overall transfer level: Needs assistance Equipment used:  (Vital go Tilt bed) Transfers: Sit to/from Stand Sit to Stand: From elevated surface;Total assist;+2 physical assistance         General transfer comment: Progressively inclined Vital go tilt bed to 75 degrees.  Pt tolerated for 11 minutes total.    BP was 121/84  at end of treatment   Was trying to make 12 min however pt got dizzy at 11 min and HR was up to 140 bpm therefore reclined a little early.  Sats at rest 92% on 3L.    Ambulation/Gait                 Stairs             Wheelchair Mobility    Modified Rankin (Stroke Patients Only)       Balance Overall balance assessment: Needs assistance         Standing balance support: No upper extremity supported;During functional activity Standing balance-Leahy Scale: Zero Standing balance comment: supported by Vital Go tilt bed                            Cognition Arousal/Alertness: Awake/alert Behavior During Therapy: WFL for tasks assessed/performed Overall Cognitive Status: Within Functional Limits for tasks assessed                                        Exercises General Exercises -  Lower Extremity Quad Sets: AROM;Both;Standing;15 reps Gluteal Sets: AROM;Both;15 reps;Standing Heel Slides: AAROM;Both;Supine;5 reps    General Comments        Pertinent Vitals/Pain Pain Assessment: Faces Faces Pain Scale: Hurts little more Pain Location: abdomen Pain Descriptors / Indicators: Constant;Discomfort;Grimacing Pain Intervention(s): Limited activity within patient's tolerance;Monitored during session;Repositioned    Home Living                      Prior Function            PT Goals (current goals can now be found in the care plan section) Acute Rehab PT Goals Patient Stated Goal: "Get over this and work again" Progress towards PT goals: Progressing toward goals    Frequency    Min 3X/week      PT Plan Current plan remains appropriate     Co-evaluation              AM-PAC PT "6 Clicks" Mobility   Outcome Measure  Help needed turning from your back to your side while in a flat bed without using bedrails?: A Lot Help needed moving from lying on your back to sitting on the side of a flat bed without using bedrails?: A Lot Help needed moving to and from a bed to a chair (including a wheelchair)?: Total Help needed standing up from a chair using your arms (e.g., wheelchair or bedside chair)?: Total Help needed to walk in hospital room?: Total Help needed climbing 3-5 steps with a railing? : Total 6 Click Score: 8    End of Session Equipment Utilized During Treatment: Oxygen;Gait belt(3LO2 with sats 90-92%) Activity Tolerance: Patient tolerated treatment well Patient left: in bed;with call bell/phone within reach(with CPM on ) Nurse Communication: Mobility status PT Visit Diagnosis: Muscle weakness (generalized) (M62.81);Difficulty in walking, not elsewhere classified (R26.2);Pain Pain - part of body: (abdomen )     Time: 3546-5681 PT Time Calculation (min) (ACUTE ONLY): 33 min  Charges:  $Gait Training: 8-22 mins $Therapeutic Exercise: 8-22 mins                     Yerlin Gasparyan,PT Acute Rehabilitation Services Pager:  [phone]  Office:  [phone]     Berline Lopes 10/25/2018, 2:07 PM

## 2018-10-26 ENCOUNTER — Inpatient Hospital Stay (HOSPITAL_COMMUNITY): Payer: Medicaid Other

## 2018-10-26 DIAGNOSIS — E43 Unspecified severe protein-calorie malnutrition: Secondary | ICD-10-CM

## 2018-10-26 LAB — BASIC METABOLIC PANEL
ANION GAP: 7 (ref 5–15)
BUN: 16 mg/dL (ref 6–20)
CO2: 23 mmol/L (ref 22–32)
Calcium: 7.5 mg/dL — ABNORMAL LOW (ref 8.9–10.3)
Chloride: 102 mmol/L (ref 98–111)
Creatinine, Ser: 0.82 mg/dL (ref 0.61–1.24)
GFR calc Af Amer: >60 mL/min (ref >60)
GFR calc non Af Amer: >60 mL/min (ref >60)
Glucose, Bld: 141 mg/dL — ABNORMAL HIGH (ref 70–99)
Potassium: 3.3 mmol/L — ABNORMAL LOW (ref 3.5–5.1)
Sodium: 132 mmol/L — ABNORMAL LOW (ref 135–145)

## 2018-10-26 LAB — CBC
HCT: 25.3 % — ABNORMAL LOW (ref 39.0–52.0)
Hemoglobin: 7.8 g/dL — ABNORMAL LOW (ref 13.0–17.0)
MCH: 28 pg (ref 26.0–34.0)
MCHC: 30.8 g/dL (ref 30.0–36.0)
MCV: 90.7 fL (ref 80.0–100.0)
Platelets: 230 10*3/uL (ref 150–400)
RBC: 2.79 MIL/uL — ABNORMAL LOW (ref 4.22–5.81)
RDW: 17.7 % — ABNORMAL HIGH (ref 11.5–15.5)
WBC: 14.1 10*3/uL — ABNORMAL HIGH (ref 4.0–10.5)
nRBC: 0 % (ref 0.0–0.2)

## 2018-10-26 LAB — COOXEMETRY PANEL
Carboxyhemoglobin: 1.9 % — ABNORMAL HIGH (ref 0.5–1.5)
Methemoglobin: 1.7 % — ABNORMAL HIGH (ref 0.0–1.5)
O2 Saturation: 76.7 %
Total hemoglobin: 8.3 g/dL — ABNORMAL LOW (ref 12.0–16.0)

## 2018-10-26 MED ORDER — POTASSIUM CHLORIDE 20 MEQ/15ML (10%) PO SOLN
40.0000 meq | Freq: Once | ORAL | Status: AC
  Administered 2018-10-26: 40 meq via ORAL
  Filled 2018-10-26: qty 30

## 2018-10-26 MED ORDER — SPIRONOLACTONE 12.5 MG HALF TABLET
12.5000 mg | ORAL_TABLET | Freq: Once | ORAL | Status: AC
  Administered 2018-10-26: 12.5 mg via ORAL
  Filled 2018-10-26: qty 1

## 2018-10-26 MED ORDER — SPIRONOLACTONE 25 MG PO TABS
25.0000 mg | ORAL_TABLET | Freq: Every day | ORAL | Status: DC
  Administered 2018-10-27 – 2018-11-03 (×8): 25 mg via ORAL
  Filled 2018-10-26 (×8): qty 1

## 2018-10-26 MED ORDER — METOCLOPRAMIDE HCL 5 MG/ML IJ SOLN
10.0000 mg | Freq: Three times a day (TID) | INTRAMUSCULAR | Status: DC
  Administered 2018-10-26 – 2018-10-31 (×14): 10 mg via INTRAVENOUS
  Filled 2018-10-26 (×14): qty 2

## 2018-10-26 MED ORDER — ENSURE ENLIVE PO LIQD
237.0000 mL | Freq: Three times a day (TID) | ORAL | Status: DC
  Administered 2018-10-26 – 2018-11-05 (×24): 237 mL via ORAL
  Filled 2018-10-26 (×2): qty 474

## 2018-10-26 MED ORDER — CARVEDILOL 3.125 MG PO TABS
3.1250 mg | ORAL_TABLET | Freq: Two times a day (BID) | ORAL | Status: DC
  Administered 2018-10-27 – 2018-11-05 (×19): 3.125 mg via ORAL
  Filled 2018-10-26 (×19): qty 1

## 2018-10-26 NOTE — TOC Progression Note (Signed)
Transition of Care [facility]) - Progression Note    Patient Details  Name: Aaron [name] MRN: [id number] Date of Birth: 1966-07-07  Transition of [facility]) CM/SW Contact  Graves-Bigelow, Lamar Laundry, RN Phone Number: 10/26/2018, 2:28 PM  Clinical Narrative:   Case Manager following for transition of care needs. Patient continues on IV  Amio gtt. NG tube remains clamped at this time-plan to slowly advance diet. CM did speak with CIR Admissions Coordinator and she is following for possible CIR admission once the NGT is out and IV Amio d/c. CM will continue to monitor for additional transition of care needs.   Expected Discharge Plan: Home w Home Health Services    Expected Discharge Plan and Services Expected Discharge Plan: Home w Home Health Services     Post Acute Care Choice: Home Health Living arrangements for the past 2 months: Single Family Home Expected Discharge Date: (unknown)                         Social Determinants of Health (SDOH) Interventions    Readmission Risk Interventions No flowsheet data found.

## 2018-10-26 NOTE — Progress Notes (Signed)
Advanced Heart Failure Rounding Note   Subjective:    Denies CP or SOB. No orthopnea or PND. Remains on iv amio. AF rate controlled. Off AC due to leg hematoma. Tolerating ASA 81 and DVT dose lovenox. Was able to stand with assistance.   Still with NGT. Tolerating liquids. Anxious about having NGT pulled out.   Objective:   Weight Range:  Vital Signs:   Temp:  [97.7 F (36.5 C)-98.1 F (36.7 C)] 97.7 F (36.5 C) (03/30 0756) Pulse Rate:  [83-116] 91 (03/30 0756) Resp:  [17-25] 23 (03/30 0756) BP: (107-114)/(66-79) 107/67 (03/30 0756) SpO2:  [95 %-99 %] 95 % (03/30 0756) Weight:  [137.9 kg] 137.9 kg (03/30 0327) Last BM Date: 10/25/18(has fecal pouch)  Weight change: Filed Weights   10/24/18 0332 10/25/18 0500 10/26/18 0327  Weight: (!) 136.5 kg (!) 136.8 kg (!) 137.9 kg   Intake/Output:   Intake/Output Summary (Last 24 hours) at 10/26/2018 1149 Last data filed at 10/26/2018 1100 Gross per 24 hour  Intake 1763.95 ml  Output 750 ml  Net 1013.95 ml    Physical Exam   General:  Obese male lying in bed  No resp difficulty HEENT: normal x NGT Neck: supple. no JVD. Carotids 2+ bilat; no bruits. No lymphadenopathy or thryomegaly appreciated. Cor: PMI nondisplaced. Irregular rate & rhythm. No rubs, gallops or murmurs. Lungs: clear Abdomen: obese soft, nontender, nondistended. No hepatosplenomegaly. No bruits or masses. Good bowel sounds. Extremities: no cyanosis, clubbing, rash, edema +UNNA boots.  Neuro: alert & orientedx3, cranial nerves grossly intact. moves all 4 extremities w/o difficulty. Affect pleasant   Telemetry   Afib 80-90s, personally reviewed.    Labs    Basic Metabolic Panel: Recent Labs  Lab 10/22/18 0423 10/23/18 0340 10/24/18 0333 10/25/18 0509 10/26/18 0405  NA 133* 133* 134* 134* 132*  K 3.4* 3.8 3.8 3.2* 3.3*  CL 101 104 103 103 102  CO2 23 24 23 25 23   GLUCOSE 143* 131* 105* 105* 141*  BUN 27* 23* 19 18 16   CREATININE 0.99 1.00  1.07 0.94 0.82  CALCIUM 7.8* 7.6* 7.9* 7.7* 7.5*  MG  --   --   --  2.0  --   PHOS  --   --   --  2.7  --    Liver Function Tests: No results for input(s): AST, ALT, ALKPHOS, BILITOT, PROT, ALBUMIN in the last 168 hours. No results for input(s): LIPASE, AMYLASE in the last 168 hours. No results for input(s): AMMONIA in the last 168 hours.  CBC: Recent Labs  Lab 10/22/18 0423 10/23/18 0340 10/24/18 0333 10/25/18 0509 10/26/18 0405  WBC 13.8* 14.3* 14.4* 14.5* 14.1*  HGB 7.7* 8.1* 8.2* 7.9* 7.8*  HCT 24.2* 24.7* 26.5* 24.5* 25.3*  MCV 93.4 89.2 89.5 89.7 90.7  PLT 319 314 295 260 230   Cardiac Enzymes: No results for input(s): CKTOTAL, CKMB, CKMBINDEX, TROPONINI in the last 168 hours.  BNP: BNP (last 3 results) Recent Labs    10/01/18 1337 10/01/18 1720  BNP 2,769.2* 2,333.1*   ProBNP (last 3 results) No results for input(s): PROBNP in the last 8760 hours.  Other results:  Imaging: Dg Abd 1 View  Result Date: 10/26/2018 CLINICAL DATA:  Ileus EXAM: ABDOMEN - 1 VIEW COMPARISON:  10/24/2018; 10/22/2018; 10/19/2018; CT abdomen pelvis-10/19/2018 FINDINGS: Enteric tube tip and side port overlies the expected location of the gastric antrum. Grossly unchanged marked gaseous distention of the colon with index loop of colon within the  left mid hemiabdomen measuring approximately 7.8 cm in diameter. No change to slight improvement of gaseous distention of several loops of small bowel within the left mid hemiabdomen with index loop measuring approximately 4.6 cm with potential bowel wall thickening. No pneumoperitoneum, pneumatosis or portal venous gas. No acute osseous abnormalities. IMPRESSION: Grossly unchanged findings most suggestive of ileus. Electronically Signed   By: Simonne Come M.D.   On: 10/26/2018 08:14    Medications:    Scheduled Medications: . aspirin EC  81 mg Oral Daily  . digoxin  0.125 mg Oral Daily  . enoxaparin (LOVENOX) injection  30 mg Subcutaneous Q24H  .  hydrocortisone  25 mg Rectal BID  . losartan  25 mg Oral Daily  . mouth rinse  15 mL Mouth Rinse BID  . metoCLOPramide (REGLAN) injection  10 mg Intravenous Q8H  . nicotine  21 mg Transdermal Daily  . pantoprazole (PROTONIX) IV  40 mg Intravenous Q12H  . polyethylene glycol  17 g Oral Daily  . sodium chloride flush  10-40 mL Intracatheter Q12H  . sodium chloride flush  3 mL Intravenous Q12H  . spironolactone  12.5 mg Oral Once  . [START ON 10/27/2018] spironolactone  25 mg Oral Daily    Infusions: . sodium chloride    . amiodarone 30 mg/hr (10/26/18 0352)    PRN Medications: sodium chloride, acetaminophen, alum & mag hydroxide-simeth, ondansetron (ZOFRAN) IV, sodium chloride flush, sodium chloride flush, witch hazel-glycerin  Assessment:   Drayvin Maggi is a 53 y.o. male with h/o of obesity who presented with c/o fluid retention, abdominal swelling, and peripheral edema x 3-4 weeks.  Echo showed new systolic CHF 15%.    CHF team consulted for follow up and treatment. Cath results pending at time of consultation.   Plan/Discussion:    1. Acute systolic CHF -> cardiogenic shock due to iCM - Echo 10/02/18 LVEF 15%, Normal RV, Mod LAE, Mod MR, Mild AI, Mild PI.  - Cath 3/13 showed severe 3v CAD with markedly decompensated hemodynamics - Off milrinone co-ox 77%. Can stop following co-ox  - Volume status stable off diuretics. Will need to restart diuretics once taking more po with resolution of ileus  - Remains on losartan, spiro and digoxin. Start low dose carvedilol  - Continue UNNA boots  2. CAD - Severe 3v CAD as above. Has been seen by Dr. Dorris Fetch ([facility]). Not CABG candidate currently given hemodynamics and lack of mobility. Plan was for PCI of LCX and LAD but given large R leg hematoma now plan for medical management,  - No s/s of ischemia despite physiologic stressors.  - Continue ASA and statin. Plavix stopped due to leg bleed. Start carvedilol today  3. Afib with RVR,  new - Off milrinone. - Back on IV amio at 30 given NPO status and elevated rates. HR improved - This patients CHA2DS2-VASc Score is at least 3.  - Anticoag on hold now with hematoma and UGI bleeding. Now toelrating DVT dose lovenox.  - May consider Eliquis (or ASA/Plavix) prior to d/c. Start carvedilol today  4. Acute large R thigh hematoma with acute blood loss anemia - Heparin off. Ortho has seen -> appreciate their recs  - Has involvement of rectus femoris muscle and femoral nerve.  - Brace applied. - Hgb stable at 7.8 . Will continue to follow - Continue SCDs.  - Continue DVT dose lovenox  5.  Acute ileus/SBO with Upper GI bleed - GI amd TRH managing (thank you) - EGD results  3/22 and CT scan 3/23 results  noted.  - suspect ileus in setting of systemic illness. Clinically improving but KUB still with persistent ileus  - Can consider increasing Reglan.   4. AKI - Resolved  5. Hypokalemia - K 3.3 today. Not tolerating oral sup well. Will give IV. >dbph  6. Severe deconditioning due to obesity - PT and CIR following.     Length of Stay: 25  Arvilla Meres, MD 10/26/2018, 11:49 AM  Advanced Heart Failure Team Pager [phone] (M-F; 7a - 4p)  Please contact [facility] for night-coverage after hours (4p -7a ) and weekends on [email or web address]

## 2018-10-26 NOTE — Progress Notes (Signed)
Inpatient Rehab Admissions Coordinator:   I met with patient at the bedside.  He continues to be highly motivated for CIR level therapies.  Discussed with RN and pt appears to be tolerating NGT clamping trial well, though still with some nausea.  Also note pt continues to require IV amiodarone for heart rate control.  Will continue to follow along for medical readiness for a possible rehab admission.    Shann Medal, PT, DPT Admissions Coordinator [phone] 10/26/18  2:47 PM

## 2018-10-26 NOTE — Progress Notes (Signed)
Focus of session on UB exercises with level 1 theraband doubled. Pt with nausea limiting activity as he was attempting to keep down an Ensure. Pt continues to be highly motivated and an excellent rehab candidate.   10/26/18 1500  OT Visit Information  Last OT Received On 10/26/18  Assistance Needed +2  History of Present Illness Pt is a 53 y.o. male admitted 10/01/18 with progressive fluid retention, abdominal/BLE swelling. Worked up for anasarca and CHF with EF 15%. Transferred to [facility] for cardiac cath 3/13, found to have multivessel CAD; not a candidate for CABG. CT 3/19 with acute R thigh hematoma; planned PCI deferred secondary to this. Pt with positive gastric occult blood; KUB shows ileus. PMH includes obesity.  Precautions  Precautions Fall  Precaution Comments monitor O2 and HR, now needs Bledsoe brace locked in extension R LE when getting up, Vital Go tilt bed  Required Braces or Orthoses Other Brace  Other Brace bledsoe R LE when getting up   Pain Assessment  Pain Assessment No/denies pain  Cognition  Arousal/Alertness Awake/alert  Behavior During Therapy WFL for tasks assessed/performed  Overall Cognitive Status Within Functional Limits for tasks assessed  Restrictions  RLE Weight Bearing WBAT  Exercises  Exercises General Upper Extremity  General Exercises - Upper Extremity  Shoulder Flexion Strengthening;Both;20 reps;Supine;Theraband  Shoulder Extension Strengthening;Both;10 reps;Supine;Theraband  Shoulder Horizontal ABduction Both;10 reps;Supine;Theraband;Strengthening  Shoulder Horizontal ADduction Both;10 reps;Theraband;Supine;Strengthening  Elbow Flexion Both;10 reps;Theraband;Supine;Strengthening  Elbow Extension Both;10 reps;Supine;Theraband;Strengthening  Theraband Level (Shoulder Horizontal Abduction) Level 1 (Yellow) (doubled)  Theraband Level (Shoulder Horizontal Adduction) Level 1 (Yellow) (doubled)  Theraband Level (Elbow Flexion) Level 1 (Yellow) (doubled)   Theraband Level (Elbow Extension) Level 1 (Yellow) (doubled)  Theraband Level (Shoulder Flexion) Level 1 (Yellow) (doubled)  Theraband Level (Shoulder Extension) Level 1 (Yellow) (doubled)  OT - End of Session  Activity Tolerance Treatment limited secondary to medical complications (Comment) (nausea)  Patient left in bed;with call bell/phone within reach  OT Assessment/Plan  OT Plan Discharge plan remains appropriate  OT Visit Diagnosis Unsteadiness on feet (R26.81);Other abnormalities of gait and mobility (R26.89);Muscle weakness (generalized) (M62.81);Pain  OT Frequency (ACUTE ONLY) Min 3X/week  Follow Up Recommendations CIR;Supervision/Assistance - 24 hour  OT Equipment Other (comment) (defer to next venue)  AM-PAC OT "6 Clicks" Daily Activity Outcome Measure (Version 2)  Help from another person eating meals? 4  Help from another person taking care of personal grooming? 3  Help from another person toileting, which includes using toliet, bedpan, or urinal? 1  Help from another person bathing (including washing, rinsing, drying)? 2  Help from another person to put on and taking off regular upper body clothing? 3  Help from another person to put on and taking off regular lower body clothing? 1  6 Click Score 14  OT Goal Progression  Progress towards OT goals Progressing toward goals  Acute Rehab OT Goals  Patient Stated Goal "Get over this and work again"  OT Goal Formulation With patient  Time For Goal Achievement 11/09/18  Potential to Achieve Goals Good  OT Time Calculation  OT Start Time (ACUTE ONLY) 1443  OT Stop Time (ACUTE ONLY) 1501  OT Time Calculation (min) 18 min  OT General Charges  $OT Visit 1 Visit  OT Treatments  $Therapeutic Exercise 8-22 mins  Martie Round, OTR/L Acute Rehabilitation Services Pager: [phone] Office: [phone]

## 2018-10-26 NOTE — Progress Notes (Signed)
[facility] Progress Note  Aaron [name] 53 y.o. Dec 16, 1965  CC: Ileus   Subjective: Had an episode of nausea yesterday.  Last bowel movement was yesterday.  Abdomen less distended.     Objective: Vital signs in last 24 hours: Vitals:   10/26/18 0327 10/26/18 0756  BP: 112/79 107/67  Pulse: 83 91  Resp: 17 (!) 23  Temp: 97.7 F (36.5 C) 97.7 F (36.5 C)  SpO2: 96% 95%    Physical Exam:  General.  Resting in the bed.  No acute distress. ABD :   Soft.  Hypoactive bowel sounds, nontender, minimally distended, bowel sounds present. Lab Results: Recent Labs    10/25/18 0509 10/26/18 0405  NA 134* 132*  K 3.2* 3.3*  CL 103 102  CO2 25 23  GLUCOSE 105* 141*  BUN 18 16  CREATININE 0.94 0.82  CALCIUM 7.7* 7.5*  MG 2.0  --   PHOS 2.7  --    No results for input(s): AST, ALT, ALKPHOS, BILITOT, PROT, ALBUMIN in the last 72 hours. Recent Labs    10/25/18 0509 10/26/18 0405  WBC 14.5* 14.1*  HGB 7.9* 7.8*  HCT 24.5* 25.3*  MCV 89.7 90.7  PLT 260 230   No results for input(s): LABPROT, INR in the last 72 hours.    Assessment/Plan: -Ileus.  Slowly improving.  Last bowel movement yesterday. -Anemia.  EGD 3/ 22 showed  LA grade C Esophagitis and gastritis.  Hemoglobin stable.  No active bleeding. -History of coronary artery disease. -Atrial  Fibrillation  Recommendations -------------------------- -Slowly advance diet as tolerated. -Repeat x-ray tomorrow or in 2 days. -Continue MiraLAX and IV PPI. -Okay to start anticoagulation from GI standpoint, may consider starting heparin drip while in the hospital. -Monitor H&H.  GI will follow   Kathi Der MD, FACP 10/26/2018, 9:20 AM  Contact #  [phone]

## 2018-10-26 NOTE — Progress Notes (Signed)
Initial Nutrition Assessment  RD working remotely.  DOCUMENTATION CODES:   Severe malnutrition in context of acute illness/injury, Obesity unspecified  INTERVENTION:   - Add Ensure Enlive TID (each provides 350 kcal, 20 g protein)   NUTRITION DIAGNOSIS:   Severe Malnutrition related to acute illness as evidenced by percent weight loss, energy intake < or equal to 50% for > or equal to 5 days  GOAL:   Patient will meet greater than or equal to 90% of their needs  MONITOR:   PO intake, Supplement acceptance, Diet advancement, Weight trends, Labs, I & O's  REASON FOR ASSESSMENT:   Low Braden  ASSESSMENT:   53 yo male, admitted with acute systolic CHF, ileus. Limited PMH significant for morbid obesity.   3/13 - presented for BL heart cath and coronary angiography 3/22 - EGD revealing small esophageal ulcers, gastritis. X-rays and CT suggestive of adynamic ileus/bowel obstruction. NG tube to suction 3/25 - NG tube clamped  Per 3/30 MD note - pt passing gas, had small BM 3/29, some nausea but no vomiting on full liquid diet; today continue full liquid diet, may remove NG tube this afternoon.  Labs: sodium 132 (L), potassium 3.3 (L), glucose 141 mg/dL (H) Meds: Reglan, Protonix, Miralax, Aldactone   Spoke with pt over the phone. Reports poor appetite and endorses ongoing nausea. Unsure of UBW and recent wt loss. Encouraged pt to include protein-rich foods with all meals and snacks, and to eat those foods first if not feeling hungry. Pt amenable to Ensure Enlive TID in chocolate. Will continue to monitor adequacy of intake.  NUTRITION - FOCUSED PHYSICAL EXAM: Deferred - RD working remotely  Diet Order:  No known food allergies 3/12 Low Na 3/21 NPO 3/22 CL 3/25 NPO 3/26 CL 3/29 FL Diet Order            Diet full liquid Room service appropriate? Yes; Fluid consistency: Thin  Diet effective now        Diet - low sodium heart healthy            PO Intake Average  25% x last 4 meals recorded  EDUCATION NEEDS:  Education needs have been addressed  Skin:  Skin Assessment: Skin Integrity Issues: Skin Integrity Issues:: Other (Comment) Other: Ecchymosis - BL arms; MASD - groin, buttocks  Last BM:  3/29  Height:  Ht Readings from Last 1 Encounters:  10/18/18 6\' 4"  (1.93 m)    Weight:  Wt Readings from Last 10 Encounters:  10/26/18 (!) 137.9 kg  Limited wt hx available  Wt trending down over course of admission Admit 3/5: 151.1 kg 13.2 kg wt loss x ~3 weeks = ~9% is clinically significant  Net +1.3 L fluid since 3/16 Edema: generalized, BLE +1 Diuresing  Ideal Body Weight:  91.8 kg  BMI:  Body mass index is 37 kg/m. obese class 2  Estimated Nutritional Needs: calculated based on IBW (~92 kg)  Kcal:  2300-2760 (25-30 kcal/kg)  Protein:  110-138 gm (1.2-1.5 g/kg)  Fluid:  >/= 1.5 L daily or per MD  Jolaine Artist, MS, RDN, LDN Pager: [phone] Available Mondays and Fridays, 9am-2pm

## 2018-10-26 NOTE — Progress Notes (Signed)
PROGRESS NOTE    Aaron [name]  DXA:[id number] DOB: 1966-01-04 DOA: 10/01/2018 PCP: [facility] [place]     Brief Narrative:  Aaron [name] is a 53 y.o. male with significant past medical history other than obesity who previously worked as a Radiation protection practitioner [facility] on March 5 with anasarca and 100 pound weight gain.  He also reported abdominal pain/intermittent diarrhea and bloating on admission which he related to?  IBS.  Wife apparently expressed concern about mild confusion at home at the time of admission. In the ED patient was noted to be tachycardic, labs revealed AKI with BUN of 53, creatinine 1.4, his LFTs were slightly elevated in 200s and BNP was elevated at 2769.  CT abdomen/pelvis showed diffuse anasarca, ascites and small bilateral pleural effusions.  Admission EKG showed normal sinus rhythm with T wave inversions in inferolateral leads.  Patient was admitted for further work-up of anasarca. Echocardiogram revealed EF of 15% with diffuse hypokinesis with left atrial moderately dilated, moderate MR and mild AR.  Patient was managed with Coreg and Lasix IV/milrinone drip/Spironolactone.  Cardiology was consulted. Hospital course was complicated by A. fib with RVR requiring heparin drip. Patient subsequently underwent cardiac cath on March 13 which revealed severe three-vessel CAD (Moderate mid LAD disease, tight distal lesion beyond reach of IMA. Severe disease in circumflex. RCA totally occluded). PCI of LAD/LCx versus CABG was considered but cardiothoracic surgery evaluated patient and did not feel he is candidate for CABG, recommended aggressive medical therapy.  Patient was maintained on aspirin, statins and Heparin.  On March 18 patient developed pain in his right thigh and noticed a hard swelling for which CT lower extremity was obtained revealing a hematoma.  Orthopedics was consulted who recommended nonoperative management of hematoma and felt patient  might have resultant temporary femoral nerve paralysis.  Hospital course also complicated by upper GI bleed around the same time prompting GI evaluation.  In view of hypotension/anemia, hematoma and upper GI bleed, heparin was held, PCI deferred and patient underwent EGD on 3/22 revealing small esophageal ulcers, gastritis.  Serial x-rays and CT abdomen suggestive of adynamic ileus/bowel obstruction.  NG tube placed. Patient transferred back to [facility] due to ongoing multiple medical conditions.   New events last 24 hours / Subjective: Had full liquid yesterday, with associated nausea without vomiting. No abdominal pain today. Had small BM yesterday. Passing gas. Very hesitant to remove NGT   Assessment & Plan:   Principal Problem:   Acute systolic CHF (congestive heart failure) (HCC) Active Problems:   Anasarca   Elevated LFTs   AKI (acute kidney injury) (HCC)   Acute hypoxemic respiratory failure (HCC)   Morbid obesity (HCC)   Atrial fibrillation with rapid ventricular response (HCC)   Shortness of breath   Sinus tachycardia   Tachypnea   Hypokalemia   Ileus/small bowel obstruction: Tolerated NGT clamp trial. Repeat AXR 3/26 still showing ileus. Repeat AXR 3/28 showed stable colonic and small bowel dilatation concerning for ileus. Started on IV reglan 3/28. Advance to full liquid diet 3/29. Repeat AXR 3/30 no change to slight improvement of gaseous distention of loops of small bowel. Continue full liquid diet today; if no nausea, plan to remove NGT late afternoon. Patient very hesitant regarding NGT removal since this is already his second NGT.   Acute blood loss anemia/upper GI bleed: S/p EGD 3/22 showing grade C esophagitis, nonbleeding esophageal ulcers and nonbleeding erosive gastropathy.  Currently on PPI.  Monitor hemoglobin and  would transfuse less than 7.5 (upper threshold given cardiac arrhythmia and low EF close ).  Hemoglobin stable at 7.8 today.  Right thigh hematoma: Seen by  orthopedics who recommended Bledsoe brace.  PT following.  Clinically improved with no swelling currently.  Hemoglobin stable and improved.  Patient noted to be on compressive wraps on lower extremities (below the knee) but denies any history of leg wounds.   Multivessel CAD: Currently off antiplatelet agents/anticoagulants given GI bleed and right thigh hematoma.  Patient however has significant heart disease and understands the risks of continuing blood thinners versus holding.  Would consider enteric-coated aspirin if and when okay with GI/orthopedics and monitor serial hemoglobins.  Acute systolic CHF: Patient has significant cardiomyopathy with EF of 10 to 15%.  Required milrinone drip/IV diuretics in the initial hospital course. Continue digoxin, cozaar, aldactone.   AKI: Present on admission.  Resolved now.   New onset atrial fibrillation/NSVT: In the setting of left atrial enlargement and low EF on echo.  Cardiology following.  Patient on amiodarone/digoxin.  Not on anticoagulation.   Morbid obesity: Body mass index is 37 kg/m.   Tobacco abuse: Nicotine patch   Leukocytosis:  Unclear etiology, not on steroids, no clear evidence of infection. Afebrile. Stable. Trend.  Hypokalemia: Mag 2.0. Replace K and trend.    DVT prophylaxis: SCD Code Status: Full Family Communication: Spoke with wife over the phone 3/27 Disposition Plan: CIR once ileus better    Consultants:   Cardiology/advanced heart failure team  Cardiothoracic surgery  Orthopedic surgery  GI  Antimicrobials:  Anti-infectives (From admission, onward)   Start     Dose/Rate Route Frequency Ordered Stop   10/01/18 1515  cefTRIAXone (ROCEPHIN) 1 g in sodium chloride 0.9 % 100 mL IVPB     1 g 200 mL/hr over 30 Minutes Intravenous  Once 10/01/18 1502 10/01/18 2311   10/01/18 1515  azithromycin (ZITHROMAX) tablet 500 mg     500 mg Oral  Once 10/01/18 1502 10/01/18 1510       Objective: Vitals:   10/25/18  2014 10/26/18 0026 10/26/18 0327 10/26/18 0756  BP: 114/79 111/69 112/79 107/67  Pulse: (!) 116 89 83 91  Resp: (!) 25 (!) 23 17 (!) 23  Temp: 98.1 F (36.7 C) 98 F (36.7 C) 97.7 F (36.5 C) 97.7 F (36.5 C)  TempSrc: Oral Oral Axillary Oral  SpO2: 96% 98% 96% 95%  Weight:   (!) 137.9 kg   Height:        Intake/Output Summary (Last 24 hours) at 10/26/2018 0921 Last data filed at 10/26/2018 0300 Gross per 24 hour  Intake 1403.95 ml  Output 950 ml  Net 453.95 ml   Filed Weights   10/24/18 0332 10/25/18 0500 10/26/18 0327  Weight: (!) 136.5 kg (!) 136.8 kg (!) 137.9 kg    Examination: General exam: Appears calm and comfortable  Respiratory system: Clear to auscultation. Respiratory effort normal. Cardiovascular system: S1 & S2 heard, Irreg rhythm. No JVD, murmurs, rubs, gallops or clicks.  Gastrointestinal system: Abdomen is nondistended, soft and nontender. No organomegaly or masses felt. Hypoactive bowel sounds heard. Central nervous system: Alert and oriented. No focal neurological deficits. Extremities: Symmetric 5 x 5 power. Skin: No rashes, lesions or ulcers Psychiatry: Judgement and insight appear normal. Mood & affect appropriate.    Data Reviewed: I have personally reviewed following labs and imaging studies  CBC: Recent Labs  Lab 10/22/18 0423 10/23/18 0340 10/24/18 0333 10/25/18 0509 10/26/18 0405  WBC 13.8*  14.3* 14.4* 14.5* 14.1*  HGB 7.7* 8.1* 8.2* 7.9* 7.8*  HCT 24.2* 24.7* 26.5* 24.5* 25.3*  MCV 93.4 89.2 89.5 89.7 90.7  PLT 319 314 295 260 230   Basic Metabolic Panel: Recent Labs  Lab 10/22/18 0423 10/23/18 0340 10/24/18 0333 10/25/18 0509 10/26/18 0405  NA 133* 133* 134* 134* 132*  K 3.4* 3.8 3.8 3.2* 3.3*  CL 101 104 103 103 102  CO2 23 24 23 25 23   GLUCOSE 143* 131* 105* 105* 141*  BUN 27* 23* 19 18 16   CREATININE 0.99 1.00 1.07 0.94 0.82  CALCIUM 7.8* 7.6* 7.9* 7.7* 7.5*  MG  --   --   --  2.0  --   PHOS  --   --   --  2.7  --      GFR: Estimated Creatinine Clearance: 156.2 mL/min (by C-G formula based on SCr of 0.82 mg/dL). Liver Function Tests: No results for input(s): AST, ALT, ALKPHOS, BILITOT, PROT, ALBUMIN in the last 168 hours. No results for input(s): LIPASE, AMYLASE in the last 168 hours. No results for input(s): AMMONIA in the last 168 hours. Coagulation Profile: No results for input(s): INR, PROTIME in the last 168 hours. Cardiac Enzymes: No results for input(s): CKTOTAL, CKMB, CKMBINDEX, TROPONINI in the last 168 hours. BNP (last 3 results) No results for input(s): PROBNP in the last 8760 hours. HbA1C: No results for input(s): HGBA1C in the last 72 hours. CBG: No results for input(s): GLUCAP in the last 168 hours. Lipid Profile: No results for input(s): CHOL, HDL, LDLCALC, TRIG, CHOLHDL, LDLDIRECT in the last 72 hours. Thyroid Function Tests: No results for input(s): TSH, T4TOTAL, FREET4, T3FREE, THYROIDAB in the last 72 hours. Anemia Panel: No results for input(s): VITAMINB12, FOLATE, FERRITIN, TIBC, IRON, RETICCTPCT in the last 72 hours. Sepsis Labs: No results for input(s): PROCALCITON, LATICACIDVEN in the last 168 hours.  No results found for this or any previous visit (from the past 240 hour(s)).     Radiology Studies: Dg Abd 1 View  Result Date: 10/26/2018 CLINICAL DATA:  Ileus EXAM: ABDOMEN - 1 VIEW COMPARISON:  10/24/2018; 10/22/2018; 10/19/2018; CT abdomen pelvis-10/19/2018 FINDINGS: Enteric tube tip and side port overlies the expected location of the gastric antrum. Grossly unchanged marked gaseous distention of the colon with index loop of colon within the left mid hemiabdomen measuring approximately 7.8 cm in diameter. No change to slight improvement of gaseous distention of several loops of small bowel within the left mid hemiabdomen with index loop measuring approximately 4.6 cm with potential bowel wall thickening. No pneumoperitoneum, pneumatosis or portal venous gas. No acute  osseous abnormalities. IMPRESSION: Grossly unchanged findings most suggestive of ileus. Electronically Signed   By: Simonne Come M.D.   On: 10/26/2018 08:14      Scheduled Meds:  aspirin EC  81 mg Oral Daily   digoxin  0.125 mg Oral Daily   enoxaparin (LOVENOX) injection  30 mg Subcutaneous Q24H   hydrocortisone  25 mg Rectal BID   losartan  25 mg Oral Daily   mouth rinse  15 mL Mouth Rinse BID   metoCLOPramide (REGLAN) injection  5 mg Intravenous Q8H   nicotine  21 mg Transdermal Daily   pantoprazole (PROTONIX) IV  40 mg Intravenous Q12H   polyethylene glycol  17 g Oral Daily   potassium chloride  40 mEq Oral Once   sodium chloride flush  10-40 mL Intracatheter Q12H   sodium chloride flush  3 mL Intravenous Q12H  spironolactone  12.5 mg Oral Daily   Continuous Infusions:  sodium chloride     amiodarone 30 mg/hr (10/26/18 0352)     LOS: 25 days    Time spent: 25 minutes   Noralee Stain, DO [facility] [email or web address] 10/26/2018, 9:21 AM

## 2018-10-26 NOTE — Progress Notes (Signed)
Physical Therapy Treatment Patient Details Name: Aaron [name] MRN: [id number] DOB: [birth year] Today's Date: 10/26/2018    History of Present Illness Pt is a 53 y.o. male admitted 10/01/18 with progressive fluid retention, abdominal/BLE swelling. Worked up for anasarca and CHF with EF 15%. Transferred to [facility] for cardiac cath 3/13, found to have multivessel CAD; not a candidate for CABG. CT 3/19 with acute R thigh hematoma; planned PCI deferred secondary to this. Pt with positive gastric occult blood; KUB shows ileus. PMH includes obesity.    PT Comments    Pt tolerated standing at 80 deg in Vital Go lift bed for 15 mins without dizziness and VSS. Performed LE and UE there ex in supine and standing. Placed pt in R CPM after session. PT will continue to follow.     Follow Up Recommendations  CIR;Supervision/Assistance - 24 hour     Equipment Recommendations  None recommended by PT    Recommendations for Other Services       Precautions / Restrictions Precautions Precautions: Fall Precaution Comments: monitor O2 and HR, now needs Bledsoe brace locked in extension R LE when getting up, Vital Go tilt bed Required Braces or Orthoses: Other Brace Other Brace: bledsoe R LE when getting up  Restrictions Weight Bearing Restrictions: No RLE Weight Bearing: Weight bearing as tolerated    Mobility  Bed Mobility Overal bed mobility: Needs Assistance             General bed mobility comments: mod A +2 for scooting to [facility]  Transfers Overall transfer level: Needs assistance Equipment used: (Vital go Tilt bed) Transfers: Sit to/from Stand Sit to Stand: From elevated surface;Total assist;+2 physical assistance         General transfer comment: Progressively inclined Vital go tilt bed to 80 degrees.  Pt tolerated for 15 minutes total.  HR remained steady 95-105 bpm. SpO2 remained in 90's on 3L.    Ambulation/Gait                 Stairs             Wheelchair  Mobility    Modified Rankin (Stroke Patients Only)       Balance Overall balance assessment: Needs assistance         Standing balance support: No upper extremity supported;During functional activity Standing balance-Leahy Scale: Zero Standing balance comment: supported by Vital Go tilt bed                            Cognition Arousal/Alertness: Awake/alert Behavior During Therapy: WFL for tasks assessed/performed Overall Cognitive Status: Within Functional Limits for tasks assessed                                        Exercises General Exercises - Upper Extremity Shoulder Flexion: AROM;Both;10 reps(bed in chair position) Shoulder Horizontal ABduction: AROM;Both;10 reps;Supine Elbow Flexion: AROM;Both;10 reps;Theraband;Supine Elbow Extension: AROM;Both;10 reps;Supine;Theraband General Exercises - Lower Extremity Ankle Circles/Pumps: AROM;Both;Supine;20 reps Quad Sets: AROM;Both;Standing;15 reps Gluteal Sets: AROM;Both;15 reps;Standing Heel Slides: AROM;Left;10 reps;Supine Hip ABduction/ADduction: AAROM;10 reps;Supine;Both    General Comments General comments (skin integrity, edema, etc.): Pt left in CPM after session      Pertinent Vitals/Pain Pain Assessment: No/denies pain    Home Living  Prior Function            PT Goals (current goals can now be found in the care plan section) Acute Rehab PT Goals Patient Stated Goal: "Get over this and work again" PT Goal Formulation: With patient/family Time For Goal Achievement: 11/04/18 Potential to Achieve Goals: Fair Progress towards PT goals: Progressing toward goals    Frequency    Min 3X/week      PT Plan Current plan remains appropriate    Co-evaluation              AM-PAC PT "6 Clicks" Mobility   Outcome Measure  Help needed turning from your back to your side while in a flat bed without using bedrails?: A Lot Help needed moving  from lying on your back to sitting on the side of a flat bed without using bedrails?: A Lot Help needed moving to and from a bed to a chair (including a wheelchair)?: Total Help needed standing up from a chair using your arms (e.g., wheelchair or bedside chair)?: Total Help needed to walk in hospital room?: Total Help needed climbing 3-5 steps with a railing? : Total 6 Click Score: 8    End of Session Equipment Utilized During Treatment: Oxygen;Gait belt(3LO2 with sats 94-100%) Activity Tolerance: Patient tolerated treatment well Patient left: in bed;with call bell/phone within reach(in CPM) Nurse Communication: Mobility status PT Visit Diagnosis: Muscle weakness (generalized) (M62.81);Difficulty in walking, not elsewhere classified (R26.2);Pain     Time: 0211-1552 PT Time Calculation (min) (ACUTE ONLY): 36 min  Charges:  $Gait Training: 8-22 mins $Therapeutic Exercise: 8-22 mins                     Lyanne Co, PT  Acute Rehab Services  Pager [phone] Office [phone]    Lawana Chambers Ebubechukwu Jedlicka 10/26/2018, 1:37 PM

## 2018-10-27 ENCOUNTER — Inpatient Hospital Stay (HOSPITAL_COMMUNITY): Payer: Medicaid Other

## 2018-10-27 LAB — CBC
HEMATOCRIT: 24.2 % — AB (ref 39.0–52.0)
Hemoglobin: 7.7 g/dL — ABNORMAL LOW (ref 13.0–17.0)
MCH: 29.1 pg (ref 26.0–34.0)
MCHC: 31.8 g/dL (ref 30.0–36.0)
MCV: 91.3 fL (ref 80.0–100.0)
Platelets: 216 10*3/uL (ref 150–400)
RBC: 2.65 MIL/uL — ABNORMAL LOW (ref 4.22–5.81)
RDW: 17.7 % — ABNORMAL HIGH (ref 11.5–15.5)
WBC: 13.3 10*3/uL — ABNORMAL HIGH (ref 4.0–10.5)
nRBC: 0 % (ref 0.0–0.2)

## 2018-10-27 LAB — BASIC METABOLIC PANEL
Anion gap: 9 (ref 5–15)
BUN: 15 mg/dL (ref 6–20)
CO2: 23 mmol/L (ref 22–32)
Calcium: 7.6 mg/dL — ABNORMAL LOW (ref 8.9–10.3)
Chloride: 100 mmol/L (ref 98–111)
Creatinine, Ser: 0.77 mg/dL (ref 0.61–1.24)
GFR calc Af Amer: >60 mL/min (ref >60)
GFR calc non Af Amer: >60 mL/min (ref >60)
GLUCOSE: 166 mg/dL — AB (ref 70–99)
Potassium: 3.4 mmol/L — ABNORMAL LOW (ref 3.5–5.1)
Sodium: 132 mmol/L — ABNORMAL LOW (ref 135–145)

## 2018-10-27 MED ORDER — POTASSIUM CHLORIDE 10 MEQ/100ML IV SOLN
10.0000 meq | INTRAVENOUS | Status: AC
  Administered 2018-10-27 (×6): 10 meq via INTRAVENOUS
  Filled 2018-10-27 (×6): qty 100

## 2018-10-27 NOTE — Progress Notes (Signed)
Physical Therapy Treatment Patient Details Name: Aaron [name] MRN: [id number] DOB: [birth year] Today's Date: 10/27/2018    History of Present Illness Pt is a 53 y.o. male admitted 10/01/18 with progressive fluid retention, abdominal/BLE swelling. Worked up for anasarca and CHF with EF 15%. Transferred to [facility] for cardiac cath 3/13, found to have multivessel CAD; not a candidate for CABG. CT 3/19 with acute R thigh hematoma; planned PCI deferred secondary to this. Pt with positive gastric occult blood; KUB shows ileus, s/p NGT placement. PMH includes obesity.   PT Comments    Pt progressing with mobility. Tolerated second tilt of the day to ~40-45 degrees; participation in therex limited by nausea. Pt awaiting abdominal xray. RN present for additional tilt bed education. Pt remains motivated to participate despite not feeling well. Continue to recommend intensive CIR-level therapies.     Follow Up Recommendations  CIR;Supervision/Assistance - 24 hour     Equipment Recommendations  None recommended by PT    Recommendations for Other Services       Precautions / Restrictions Precautions Precautions: Fall Precaution Comments: monitor O2 and HR, Vital Go tilt bed Required Braces or Orthoses: Other Brace Other Brace: RLE Bledsoe brace locked in extension when up Restrictions Weight Bearing Restrictions: Yes RLE Weight Bearing: Weight bearing as tolerated    Mobility  Bed Mobility                  Transfers Overall transfer level: Needs assistance Equipment used: (VitalGo tilt bed) Transfers: Sit to/from Stand           General transfer comment: Tilt bed with x3 straps at chest, mid thigh and below knee. Recommend third strap placed across or just below knee joint next session. Tolerated tilt to 18' although BLEs quickly fatigued dropping into knee flex/hip ER; tolerated 40-45' tilt well for ~15 minutes  Ambulation/Gait                 Stairs              Wheelchair Mobility    Modified Rankin (Stroke Patients Only)       Balance Overall balance assessment: Needs assistance                                          Cognition Arousal/Alertness: Awake/alert Behavior During Therapy: WFL for tasks assessed/performed Overall Cognitive Status: Within Functional Limits for tasks assessed                                        Exercises Other Exercises Other Exercises: BLE knee extension, quad sets, glut sets    General Comments General comments (skin integrity, edema, etc.): RN present to assist with tilt. Pt limited by nausea, awaiting abdominal xray      Pertinent Vitals/Pain Pain Assessment: Faces Faces Pain Scale: Hurts little more Pain Location: abdomen, nausea Pain Descriptors / Indicators: Discomfort;Grimacing Pain Intervention(s): Limited activity within patient's tolerance;Monitored during session    Home Living                      Prior Function            PT Goals (current goals can now be found in the care plan section) Acute Rehab PT Goals Patient Stated Goal:  Less nausea PT Goal Formulation: With patient Time For Goal Achievement: 11/04/18 Potential to Achieve Goals: Fair Progress towards PT goals: Progressing toward goals    Frequency    Min 3X/week      PT Plan Current plan remains appropriate    Co-evaluation              AM-PAC PT "6 Clicks" Mobility   Outcome Measure  Help needed turning from your back to your side while in a flat bed without using bedrails?: A Lot Help needed moving from lying on your back to sitting on the side of a flat bed without using bedrails?: A Lot Help needed moving to and from a bed to a chair (including a wheelchair)?: Total Help needed standing up from a chair using your arms (e.g., wheelchair or bedside chair)?: Total Help needed to walk in hospital room?: Total Help needed climbing 3-5 steps with a  railing? : Total 6 Click Score: 8    End of Session Equipment Utilized During Treatment: Gait belt;Oxygen Activity Tolerance: Patient tolerated treatment well Patient left: in bed;with call bell/phone within reach Nurse Communication: Mobility status PT Visit Diagnosis: Muscle weakness (generalized) (M62.81);Difficulty in walking, not elsewhere classified (R26.2);Pain Pain - part of body: (abdomen)     Time: 4287-6811 PT Time Calculation (min) (ACUTE ONLY): 24 min  Charges:  $Therapeutic Exercise: 8-22 mins $Therapeutic Activity: 8-22 mins                     Ina Homes, PT, DPT Acute Rehabilitation Services  Pager [phone] Office [phone]  Malachy Chamber 10/27/2018, 5:26 PM

## 2018-10-27 NOTE — Progress Notes (Signed)
Patient c/o feeling bloated and nauseated,  did not drink lunch or ensure.  Spoke with Dr. Alvino Chapel and abd x-ray ordered. Patient made NPO.  Patient did not want zofran at this time.

## 2018-10-27 NOTE — Progress Notes (Signed)
PROGRESS NOTE    Aaron [name]  ZOX:[id number] DOB: 21-Aug-1965 DOA: 10/01/2018 PCP: [facility] [place]     Brief Narrative:  Aaron [name] is a 53 y.o. male with significant past medical history other than obesity who previously worked as a Radiation protection practitioner presented to [facility] on March 5 with anasarca and 100 pound weight gain.  He also reported abdominal pain/intermittent diarrhea and bloating on admission which he related to?  IBS.  Wife apparently expressed concern about mild confusion at home at the time of admission. In the ED patient was noted to be tachycardic, labs revealed AKI with BUN of 53, creatinine 1.4, his LFTs were slightly elevated in 200s and BNP was elevated at 2769.  CT abdomen/pelvis showed diffuse anasarca, ascites and small bilateral pleural effusions.  Admission EKG showed normal sinus rhythm with T wave inversions in inferolateral leads.  Patient was admitted for further work-up of anasarca. Echocardiogram revealed EF of 15% with diffuse hypokinesis with left atrial moderately dilated, moderate MR and mild AR.  Patient was managed with Coreg and Lasix IV/milrinone drip/Spironolactone.  Cardiology was consulted. Hospital course was complicated by A. fib with RVR requiring heparin drip. Patient subsequently underwent cardiac cath on March 13 which revealed severe three-vessel CAD (Moderate mid LAD disease, tight distal lesion beyond reach of IMA. Severe disease in circumflex. RCA totally occluded). PCI of LAD/LCx versus CABG was considered but cardiothoracic surgery evaluated patient and did not feel he is candidate for CABG, recommended aggressive medical therapy.  Patient was maintained on aspirin, statins and Heparin.  On March 18 patient developed pain in his right thigh and noticed a hard swelling for which CT lower extremity was obtained revealing a hematoma.  Orthopedics was consulted who recommended nonoperative management of hematoma and felt patient  might have resultant temporary femoral nerve paralysis.  Hospital course also complicated by upper GI bleed around the same time prompting GI evaluation.  In view of hypotension/anemia, hematoma and upper GI bleed, heparin was held, PCI deferred and patient underwent EGD on 3/22 revealing small esophageal ulcers, gastritis.  Serial x-rays and CT abdomen suggestive of adynamic ileus/bowel obstruction.  NG tube placed. Patient transferred back to [facility] due to ongoing multiple medical conditions.   New events last 24 hours / Subjective: Significant nausea yesterday without vomiting. Some abdominal discomfort. No nausea today yet. Had BM yesterday.   Assessment & Plan:   Principal Problem:   Acute systolic CHF (congestive heart failure) (HCC) Active Problems:   Anasarca   Elevated LFTs   AKI (acute kidney injury) (HCC)   Acute hypoxemic respiratory failure (HCC)   Morbid obesity (HCC)   Atrial fibrillation with rapid ventricular response (HCC)   Shortness of breath   Sinus tachycardia   Tachypnea   Hypokalemia   Protein-calorie malnutrition, severe   Ileus/small bowel obstruction: Tolerated NGT clamp trial. Repeat AXR 3/26 still showing ileus. Repeat AXR 3/28 showed stable colonic and small bowel dilatation concerning for ileus. Started on IV reglan 3/28. Advance to full liquid diet 3/29. Repeat AXR 3/30 no change to slight improvement of gaseous distention of loops of small bowel. Continue full liquid diet today; if no nausea, plan to remove NGT late afternoon. Repeat AXR 4/1. Patient very hesitant regarding NGT removal since this is already his second NGT.   Acute blood loss anemia/upper GI bleed: S/p EGD 3/22 showing grade C esophagitis, nonbleeding esophageal ulcers and nonbleeding erosive gastropathy.  Currently on PPI.  Monitor hemoglobin and would  transfuse less than 7.5 (upper threshold given cardiac arrhythmia and low EF close ).  Hemoglobin stable at 7.7 today.  Right thigh hematoma:  Seen by orthopedics who recommended Bledsoe brace.  PT following.  Clinically improved with no swelling currently.  Hemoglobin stable and improved.  Patient noted to be on compressive wraps on lower extremities (below the knee) but denies any history of leg wounds.   Multivessel CAD: Aspirin daily   Acute systolic CHF: Patient has significant cardiomyopathy with EF of 10 to 15%.  Required milrinone drip/IV diuretics in the initial hospital course. Continue digoxin, cozaar, aldactone.   AKI: Present on admission.  Resolved now.   New onset atrial fibrillation/NSVT: In the setting of left atrial enlargement and low EF on echo.  Cardiology following.  Patient on amiodarone/digoxin.  Not on anticoagulation.   Morbid obesity: Body mass index is 36.52 kg/m.   Tobacco abuse: Nicotine patch   Leukocytosis:  Unclear etiology, not on steroids, no clear evidence of infection. Afebrile. Stable. Trending down today   Hypokalemia: Replace and trend.    DVT prophylaxis: Lovenox  Code Status: Full Family Communication: Spoke with wife over the phone 3/31 Disposition Plan: CIR once ileus better    Consultants:   Cardiology/advanced heart failure team  Cardiothoracic surgery  Orthopedic surgery  GI  Antimicrobials:  Anti-infectives (From admission, onward)   Start     Dose/Rate Route Frequency Ordered Stop   10/01/18 1515  cefTRIAXone (ROCEPHIN) 1 g in sodium chloride 0.9 % 100 mL IVPB     1 g 200 mL/hr over 30 Minutes Intravenous  Once 10/01/18 1502 10/01/18 2311   10/01/18 1515  azithromycin (ZITHROMAX) tablet 500 mg     500 mg Oral  Once 10/01/18 1502 10/01/18 1510       Objective: Vitals:   10/26/18 2318 10/27/18 0629 10/27/18 0630 10/27/18 0846  BP: 113/66 124/88  106/82  Pulse: 87  98 88  Resp: 19 18    Temp:  (!) 97.4 F (36.3 C)    TempSrc:  Oral    SpO2:   98%   Weight:   136.1 kg   Height:        Intake/Output Summary (Last 24 hours) at 10/27/2018 0957 Last  data filed at 10/27/2018 0403 Gross per 24 hour  Intake 999.85 ml  Output 775 ml  Net 224.85 ml   Filed Weights   10/25/18 0500 10/26/18 0327 10/27/18 0630  Weight: (!) 136.8 kg (!) 137.9 kg 136.1 kg    Examination: General exam: Appears calm and comfortable  Respiratory system: Clear to auscultation. Respiratory effort normal. Cardiovascular system: S1 & S2 heard, Irreg rhythm. Gastrointestinal system: Abdomen is nondistended, soft and nontender. No organomegaly or masses felt. +bowel sounds heard. Central nervous system: Alert and oriented. No focal neurological deficits. Extremities: Symmetric  Skin: No rashes, lesions or ulcers Psychiatry: Judgement and insight appear normal. Mood & affect appropriate.    Data Reviewed: I have personally reviewed following labs and imaging studies  CBC: Recent Labs  Lab 10/23/18 0340 10/24/18 0333 10/25/18 0509 10/26/18 0405 10/27/18 0550  WBC 14.3* 14.4* 14.5* 14.1* 13.3*  HGB 8.1* 8.2* 7.9* 7.8* 7.7*  HCT 24.7* 26.5* 24.5* 25.3* 24.2*  MCV 89.2 89.5 89.7 90.7 91.3  PLT 314 295 260 230 216   Basic Metabolic Panel: Recent Labs  Lab 10/23/18 0340 10/24/18 0333 10/25/18 0509 10/26/18 0405 10/27/18 0550  NA 133* 134* 134* 132* 132*  K 3.8 3.8 3.2* 3.3* 3.4*  CL 104 103 103 102 100  CO2 24 23 25 23 23   GLUCOSE 131* 105* 105* 141* 166*  BUN 23* 19 18 16 15   CREATININE 1.00 1.07 0.94 0.82 0.77  CALCIUM 7.6* 7.9* 7.7* 7.5* 7.6*  MG  --   --  2.0  --   --   PHOS  --   --  2.7  --   --    GFR: Estimated Creatinine Clearance: 159 mL/min (by C-G formula based on SCr of 0.77 mg/dL). Liver Function Tests: No results for input(s): AST, ALT, ALKPHOS, BILITOT, PROT, ALBUMIN in the last 168 hours. No results for input(s): LIPASE, AMYLASE in the last 168 hours. No results for input(s): AMMONIA in the last 168 hours. Coagulation Profile: No results for input(s): INR, PROTIME in the last 168 hours. Cardiac Enzymes: No results for  input(s): CKTOTAL, CKMB, CKMBINDEX, TROPONINI in the last 168 hours. BNP (last 3 results) No results for input(s): PROBNP in the last 8760 hours. HbA1C: No results for input(s): HGBA1C in the last 72 hours. CBG: No results for input(s): GLUCAP in the last 168 hours. Lipid Profile: No results for input(s): CHOL, HDL, LDLCALC, TRIG, CHOLHDL, LDLDIRECT in the last 72 hours. Thyroid Function Tests: No results for input(s): TSH, T4TOTAL, FREET4, T3FREE, THYROIDAB in the last 72 hours. Anemia Panel: No results for input(s): VITAMINB12, FOLATE, FERRITIN, TIBC, IRON, RETICCTPCT in the last 72 hours. Sepsis Labs: No results for input(s): PROCALCITON, LATICACIDVEN in the last 168 hours.  No results found for this or any previous visit (from the past 240 hour(s)).     Radiology Studies: Dg Abd 1 View  Result Date: 10/26/2018 CLINICAL DATA:  Ileus EXAM: ABDOMEN - 1 VIEW COMPARISON:  10/24/2018; 10/22/2018; 10/19/2018; CT abdomen pelvis-10/19/2018 FINDINGS: Enteric tube tip and side port overlies the expected location of the gastric antrum. Grossly unchanged marked gaseous distention of the colon with index loop of colon within the left mid hemiabdomen measuring approximately 7.8 cm in diameter. No change to slight improvement of gaseous distention of several loops of small bowel within the left mid hemiabdomen with index loop measuring approximately 4.6 cm with potential bowel wall thickening. No pneumoperitoneum, pneumatosis or portal venous gas. No acute osseous abnormalities. IMPRESSION: Grossly unchanged findings most suggestive of ileus. Electronically Signed   By: Simonne Come M.D.   On: 10/26/2018 08:14      Scheduled Meds:  aspirin EC  81 mg Oral Daily   carvedilol  3.125 mg Oral BID WC   digoxin  0.125 mg Oral Daily   enoxaparin (LOVENOX) injection  30 mg Subcutaneous Q24H   feeding supplement (ENSURE ENLIVE)  237 mL Oral TID BM   hydrocortisone  25 mg Rectal BID   losartan  25  mg Oral Daily   mouth rinse  15 mL Mouth Rinse BID   metoCLOPramide (REGLAN) injection  10 mg Intravenous Q8H   nicotine  21 mg Transdermal Daily   pantoprazole (PROTONIX) IV  40 mg Intravenous Q12H   polyethylene glycol  17 g Oral Daily   sodium chloride flush  10-40 mL Intracatheter Q12H   sodium chloride flush  3 mL Intravenous Q12H   spironolactone  25 mg Oral Daily   Continuous Infusions:  sodium chloride 250 mL (10/27/18 0837)   amiodarone 30 mg/hr (10/27/18 0403)   potassium chloride 10 mEq (10/27/18 0954)     LOS: 26 days    Time spent: 25 minutes   Noralee Stain, DO [facility] [email or web address] 10/27/2018,  9:57 AM

## 2018-10-27 NOTE — Progress Notes (Signed)
Patient reports decreased nausea, 100cc of brown liquid from NGT.

## 2018-10-27 NOTE — Progress Notes (Signed)
Patient reporting increased nausea.  Given IV zofran and Dr. Alvino Chapel notified.  Order to restart NGT to low intermittent wall suction.

## 2018-10-27 NOTE — Progress Notes (Signed)
[facility] Gastroenterology Progress Note  Shaw Dirickson 53 y.o. [birth year]  CC: Ileus   Subjective: Nausea improved.  Tolerating clear liquid diet.  Last bowel movement yesterday.  Overall feeling little better.     Objective: Vital signs in last 24 hours: Vitals:   10/27/18 0630 10/27/18 0846  BP:  106/82  Pulse: 98 88  Resp:    Temp:    SpO2: 98%     Physical Exam:  General.  Resting in the bed.  No acute distress. ABD :   Soft.  Hypoactive bowel sounds, right upper quadrant discomfort on palpation minimally distended, bowel sounds present.   Lab Results: Recent Labs    10/25/18 0509 10/26/18 0405 10/27/18 0550  NA 134* 132* 132*  K 3.2* 3.3* 3.4*  CL 103 102 100  CO2 25 23 23   GLUCOSE 105* 141* 166*  BUN 18 16 15   CREATININE 0.94 0.82 0.77  CALCIUM 7.7* 7.5* 7.6*  MG 2.0  --   --   PHOS 2.7  --   --    No results for input(s): AST, ALT, ALKPHOS, BILITOT, PROT, ALBUMIN in the last 72 hours. Recent Labs    10/26/18 0405 10/27/18 0550  WBC 14.1* 13.3*  HGB 7.8* 7.7*  HCT 25.3* 24.2*  MCV 90.7 91.3  PLT 230 216   No results for input(s): LABPROT, INR in the last 72 hours.    Assessment/Plan: -Ileus.  Slowly improving.  Last bowel movement yesterday. -Anemia.  EGD 3/ 22 showed  LA grade C Esophagitis and gastritis.  Hemoglobin stable.  No active bleeding. -History of coronary artery disease. -Atrial  Fibrillation and right thigh hematoma.  Recommendations -------------------------- -Slowly advance diet as tolerated. -Repeat x-ray tomorrow  -Continue MiraLAX and IV PPI. -Okay to start anticoagulation from GI standpoint.  So far hemoglobin stable on aspirin and prophylactic/DVT dose Lovenox. -Monitor H&H.  GI will follow   Kathi Der MD, FACP 10/27/2018, 10:15 AM  Contact #  [phone]

## 2018-10-27 NOTE — Progress Notes (Signed)
Advanced Heart Failure Rounding Note   Subjective:    Still with NGT in place. Tolerating liquids.   Denies CP, SOB, orthopnea, or PND. Remains on IV amio. AF rates 80-90.   Objective:   Weight Range:  Vital Signs:   Temp:  [97.4 F (36.3 C)-99.1 F (37.3 C)] 97.4 F (36.3 C) (03/31 0629) Pulse Rate:  [87-98] 88 (03/31 0846) Resp:  [16-26] 18 (03/31 0629) BP: (102-124)/(62-88) 106/82 (03/31 0846) SpO2:  [90 %-98 %] 98 % (03/31 0630) FiO2 (%):  [3 %] 3 % (03/31 0630) Weight:  [136.1 kg] 136.1 kg (03/31 0630) Last BM Date: 10/25/18(has fecal pouch)  Weight change: Filed Weights   10/25/18 0500 10/26/18 0327 10/27/18 0630  Weight: (!) 136.8 kg (!) 137.9 kg 136.1 kg   Intake/Output:   Intake/Output Summary (Last 24 hours) at 10/27/2018 0914 Last data filed at 10/27/2018 0403 Gross per 24 hour  Intake 999.85 ml  Output 775 ml  Net 224.85 ml    Physical Exam   General:  Lying in bed. No resp difficulty HEENT: normal + NGT  Neck: supple. no JVD. Carotids 2+ bilat; no bruits. No lymphadenopathy or thryomegaly appreciated. Cor: PMI nondisplaced. Regular rate & rhythm. No rubs, gallops or murmurs. Lungs: clear Abdomen: soft, nontender, nondistended. No hepatosplenomegaly. No bruits or masses. Good bowel sounds. Extremities: no cyanosis, clubbing, rash, edema + UNNA boots Neuro: alert & orientedx3, cranial nerves grossly intact. moves all 4 extremities w/o difficulty. Affect pleasant   Telemetry   Afib 80-9s, personally reviewed.    Labs    Basic Metabolic Panel: Recent Labs  Lab 10/23/18 0340 10/24/18 0333 10/25/18 0509 10/26/18 0405 10/27/18 0550  NA 133* 134* 134* 132* 132*  K 3.8 3.8 3.2* 3.3* 3.4*  CL 104 103 103 102 100  CO2 24 23 25 23 23   GLUCOSE 131* 105* 105* 141* 166*  BUN 23* 19 18 16 15   CREATININE 1.00 1.07 0.94 0.82 0.77  CALCIUM 7.6* 7.9* 7.7* 7.5* 7.6*  MG  --   --  2.0  --   --   PHOS  --   --  2.7  --   --    Liver Function  Tests: No results for input(s): AST, ALT, ALKPHOS, BILITOT, PROT, ALBUMIN in the last 168 hours. No results for input(s): LIPASE, AMYLASE in the last 168 hours. No results for input(s): AMMONIA in the last 168 hours.  CBC: Recent Labs  Lab 10/23/18 0340 10/24/18 0333 10/25/18 0509 10/26/18 0405 10/27/18 0550  WBC 14.3* 14.4* 14.5* 14.1* 13.3*  HGB 8.1* 8.2* 7.9* 7.8* 7.7*  HCT 24.7* 26.5* 24.5* 25.3* 24.2*  MCV 89.2 89.5 89.7 90.7 91.3  PLT 314 295 260 230 216   Cardiac Enzymes: No results for input(s): CKTOTAL, CKMB, CKMBINDEX, TROPONINI in the last 168 hours.  BNP: BNP (last 3 results) Recent Labs    10/01/18 1337 10/01/18 1720  BNP 2,769.2* 2,333.1*   ProBNP (last 3 results) No results for input(s): PROBNP in the last 8760 hours.  Other results:  Imaging: Dg Abd 1 View  Result Date: 10/26/2018 CLINICAL DATA:  Ileus EXAM: ABDOMEN - 1 VIEW COMPARISON:  10/24/2018; 10/22/2018; 10/19/2018; CT abdomen pelvis-10/19/2018 FINDINGS: Enteric tube tip and side port overlies the expected location of the gastric antrum. Grossly unchanged marked gaseous distention of the colon with index loop of colon within the left mid hemiabdomen measuring approximately 7.8 cm in diameter. No change to slight improvement of gaseous distention of several loops  of small bowel within the left mid hemiabdomen with index loop measuring approximately 4.6 cm with potential bowel wall thickening. No pneumoperitoneum, pneumatosis or portal venous gas. No acute osseous abnormalities. IMPRESSION: Grossly unchanged findings most suggestive of ileus. Electronically Signed   By: Simonne Come M.D.   On: 10/26/2018 08:14    Medications:    Scheduled Medications: . aspirin EC  81 mg Oral Daily  . carvedilol  3.125 mg Oral BID WC  . digoxin  0.125 mg Oral Daily  . enoxaparin (LOVENOX) injection  30 mg Subcutaneous Q24H  . feeding supplement (ENSURE ENLIVE)  237 mL Oral TID BM  . hydrocortisone  25 mg Rectal BID   . losartan  25 mg Oral Daily  . mouth rinse  15 mL Mouth Rinse BID  . metoCLOPramide (REGLAN) injection  10 mg Intravenous Q8H  . nicotine  21 mg Transdermal Daily  . pantoprazole (PROTONIX) IV  40 mg Intravenous Q12H  . polyethylene glycol  17 g Oral Daily  . sodium chloride flush  10-40 mL Intracatheter Q12H  . sodium chloride flush  3 mL Intravenous Q12H  . spironolactone  25 mg Oral Daily    Infusions: . sodium chloride 250 mL (10/27/18 0837)  . amiodarone 30 mg/hr (10/27/18 0403)  . potassium chloride 10 mEq (10/27/18 0839)    PRN Medications: sodium chloride, acetaminophen, alum & mag hydroxide-simeth, ondansetron (ZOFRAN) IV, sodium chloride flush, sodium chloride flush, witch hazel-glycerin  Assessment:   Aaron [name] is a 53 y.o. male with h/o of obesity who presented with c/o fluid retention, abdominal swelling, and peripheral edema x 3-4 weeks.  Echo showed new systolic CHF 15%.    CHF team consulted for follow up and treatment. Cath results pending at time of consultation.   Plan/Discussion:    1. Acute systolic CHF -> cardiogenic shock due to iCM - Echo 10/02/18 LVEF 15%, Normal RV, Mod LAE, Mod MR, Mild AI, Mild PI.  - Cath 3/13 showed severe 3v CAD with markedly decompensated hemodynamics - Off milrinone - Volume status and weight stable off diuretics. Will need to restart diuretics once taking more po with resolution of ileus  - Remains on losartan, spiro, carvedilol and digoxin.  - Continue UNNA boots  2. CAD - Severe 3v CAD as above. Has been seen by Dr. Dorris Fetch ([facility]). Not CABG candidate currently given hemodynamics and lack of mobility. Plan was for PCI of LCX and LAD but given large R leg hematoma now plan for medical management,  - No s/s of ischemia despite physiologic stressors.  - Continue ASA,b-blocker and statin. Plavix stopped due to leg bleed.   3. Afib with RVR, new - Off milrinone. - Back on IV amio at 30 given NPO status and elevated  rates. HR improved - This patients CHA2DS2-VASc Score is at least 3.  - Anticoag on hold now with hematoma and UGI bleeding. Now toelrating DVT dose lovenox.  - May consider Eliquis (or ASA/Plavix) prior to d/c.   4. Acute large R thigh hematoma with acute blood loss anemia - Heparin off. Ortho has seen -> appreciate their recs  - Has involvement of rectus femoris muscle and femoral nerve.  - Brace applied. - Hgb stable at 7.7 . Will continue to follow. Transfuse under 7.5 - Continue SCDs.  - Continue DVT dose lovenox  5.  Acute ileus/SBO with Upper GI bleed - GI amd TRH managing (thank you) - EGD results 3/22 and CT scan 3/23 results  noted.  -  suspect ileus in setting of systemic illness. Clinically improving but KUB still with persistent ileus  - Continue Reglan.   4. AKI - Resolved  5. Hypokalemia - K 3.4 today. Not tolerating oral sup well. Will give IV.   6. Severe deconditioning due to obesity - PT and CIR following.     Length of Stay: 72  Arvilla Meres, MD 10/27/2018, 9:14 AM  Advanced Heart Failure Team Pager [phone] (M-F; 7a - 4p)  Please contact [facility] for night-coverage after hours (4p -7a ) and weekends on [email or web address]

## 2018-10-27 NOTE — Progress Notes (Signed)
Occupational Therapy Treatment Patient Details Name: Aaron [name] MRN: [id number] DOB: [birth year] Today's Date: 10/27/2018    History of present illness Pt is a 53 y.o. male admitted 10/01/18 with progressive fluid retention, abdominal/BLE swelling. Worked up for anasarca and CHF with EF 15%. Transferred to [facility] for cardiac cath 3/13, found to have multivessel CAD; not a candidate for CABG. CT 3/19 with acute R thigh hematoma; planned PCI deferred secondary to this. Pt with positive gastric occult blood; KUB shows ileus. PMH includes obesity.   OT comments  Pt listening to AC/DC music while standing for 20 minutes with max tilt to 48 degrees with full body activation exercises. Pt with RR 39 and requires rest break after 10 rep exercises. Pt encouraged and enjoyed standing in tilt bed. RN present and educated on bed tilt with handouts provided for bed.  Chart posted in the room to chart tilt bed with hopes of 3 tilts per day for minimum of 15 minutes   Pt must have R KI on R LE in extension, pt must have bil LE socks don and pt must have 3 straps placed prior to tilt of the bed.    Follow Up Recommendations  CIR;Supervision/Assistance - 24 hour    Equipment Recommendations  Other (comment)(Defer )    Recommendations for Other Services PT consult;Rehab consult    Precautions / Restrictions Precautions Precautions: Fall Precaution Comments: monitor O2 and HR, now needs Bledsoe brace locked in extension R LE when getting up, Vital Go tilt bed Required Braces or Orthoses: Other Brace Other Brace: bledsoe R LE when getting up  Restrictions Weight Bearing Restrictions: No       Mobility Bed Mobility Overal bed mobility: Needs Assistance             General bed mobility comments: tilt bed with x3 straps. pt with strap at chest, above knee and calf area. Recommend next session to place straps closer together so that the 3rd strap acts more as a patella strap just below the knee. Pt  tolerated HOB elevation progression to 48 degrees  Transfers                      Balance                                           ADL either performed or assessed with clinical judgement   ADL Overall ADL's : Needs assistance/impaired                                       General ADL Comments: Tilt bed used for exercise this session see below listening to [facility] DC music     Vision       Perception     Praxis      Cognition Arousal/Alertness: Awake/alert Behavior During Therapy: WFL for tasks assessed/performed Overall Cognitive Status: Within Functional Limits for tasks assessed                                          Exercises Exercises: Other exercises;General Upper Extremity;Low Level/ICU General Exercises - Upper Extremity Shoulder Flexion: Strengthening;AROM;Both;10 reps;Standing Elbow Flexion: AROM;Both;10 reps;Standing General Exercises - Lower  Extremity Quad Sets: AROM;Both;20 reps;10 reps;Standing Gluteal Sets: AROM;Both;20 reps;10 reps;Standing Hip ABduction/ADduction: AROM;Both;10 reps;Supine Other Exercises Other Exercises: core activation with static standing in tilt bed Other Exercises: pt completed hand to head, bil UE arm extension with 3 inches off mattress isolmetric hold  then having patient move up an inch and down inch (padding bed surface) Other Exercises: neck rotation R and L Other Exercises: scapula retraction x10 in standing Other Exercises: Education provided to RN in room and posted education in room on wall. RN provided hanout to pass to next shift RN   Shoulder Instructions       General Comments RR 39 with UB activation. pt with HR 120s with activation. pt requires rest breaks. pt able to tolerate LB activation with RR 20s     Pertinent Vitals/ Pain       Pain Assessment: No/denies pain  Home Living                                          Prior  Functioning/Environment              Frequency  Min 3X/week        Progress Toward Goals  OT Goals(current goals can now be found in the care plan section)  Progress towards OT goals: Progressing toward goals  Acute Rehab OT Goals Patient Stated Goal: to get moving OT Goal Formulation: With patient Time For Goal Achievement: 11/09/18 Potential to Achieve Goals: Good ADL Goals Pt Will Perform Grooming: with min guard assist;standing Pt Will Perform Upper Body Dressing: with set-up;with supervision;sitting Pt Will Perform Lower Body Dressing: with min guard assist;with adaptive equipment;sit to/from stand Pt Will Transfer to Toilet: with min assist;ambulating;bedside commode Pt Will Perform Toileting - Clothing Manipulation and hygiene: with min guard assist;sitting/lateral leans;sit to/from stand;with adaptive equipment Pt/caregiver will Perform Home Exercise Program: Increased ROM;Increased strength;Both right and left upper extremity;With written HEP provided;Independently Additional ADL Goal #1: Pt will perform bed mobility with supervision in preparation for ADLs  Plan Discharge plan remains appropriate    Co-evaluation                 AM-PAC OT "6 Clicks" Daily Activity     Outcome Measure   Help from another person eating meals?: None Help from another person taking care of personal grooming?: A Little Help from another person toileting, which includes using toliet, bedpan, or urinal?: Total Help from another person bathing (including washing, rinsing, drying)?: A Lot Help from another person to put on and taking off regular upper body clothing?: A Little Help from another person to put on and taking off regular lower body clothing?: Total 6 Click Score: 14    End of Session Equipment Utilized During Treatment: Oxygen  OT Visit Diagnosis: Unsteadiness on feet (R26.81);Other abnormalities of gait and mobility (R26.89);Muscle weakness (generalized)  (M62.81);Pain Pain - part of body: Leg   Activity Tolerance Patient tolerated treatment well   Patient Left in bed;with call bell/phone within reach   Nurse Communication Mobility status;Precautions        Time: 7588-3254 OT Time Calculation (min): 27 min  Charges: OT General Charges $OT Visit: 1 Visit OT Treatments $Therapeutic Activity: 8-22 mins $Therapeutic Exercise: 8-22 mins   Mateo Flow, OTR/L  Acute Rehabilitation Services Pager: [phone] Office: [phone] .    Mateo Flow 10/27/2018, 2:11 PM

## 2018-10-28 DIAGNOSIS — I5021 Acute systolic (congestive) heart failure: Secondary | ICD-10-CM

## 2018-10-28 DIAGNOSIS — I4891 Unspecified atrial fibrillation: Secondary | ICD-10-CM

## 2018-10-28 HISTORY — DX: Acute systolic (congestive) heart failure: I50.21

## 2018-10-28 HISTORY — DX: Unspecified atrial fibrillation: I48.91

## 2018-10-28 LAB — BASIC METABOLIC PANEL
Anion gap: 8 (ref 5–15)
BUN: 16 mg/dL (ref 6–20)
CALCIUM: 7.9 mg/dL — AB (ref 8.9–10.3)
CO2: 25 mmol/L (ref 22–32)
Chloride: 100 mmol/L (ref 98–111)
Creatinine, Ser: 0.94 mg/dL (ref 0.61–1.24)
GFR calc Af Amer: >60 mL/min (ref >60)
GFR calc non Af Amer: >60 mL/min (ref >60)
Glucose, Bld: 154 mg/dL — ABNORMAL HIGH (ref 70–99)
Potassium: 3.9 mmol/L (ref 3.5–5.1)
Sodium: 133 mmol/L — ABNORMAL LOW (ref 135–145)

## 2018-10-28 LAB — CBC
HCT: 24.5 % — ABNORMAL LOW (ref 39.0–52.0)
HEMOGLOBIN: 7.7 g/dL — AB (ref 13.0–17.0)
MCH: 29.1 pg (ref 26.0–34.0)
MCHC: 31.4 g/dL (ref 30.0–36.0)
MCV: 92.5 fL (ref 80.0–100.0)
NRBC: 0 % (ref 0.0–0.2)
Platelets: 202 10*3/uL (ref 150–400)
RBC: 2.65 MIL/uL — AB (ref 4.22–5.81)
RDW: 18 % — ABNORMAL HIGH (ref 11.5–15.5)
WBC: 13.7 10*3/uL — ABNORMAL HIGH (ref 4.0–10.5)

## 2018-10-28 LAB — MAGNESIUM: Magnesium: 1.9 mg/dL (ref 1.7–2.4)

## 2018-10-28 MED ORDER — FLEET ENEMA 7-19 GM/118ML RE ENEM
1.0000 | ENEMA | Freq: Every day | RECTAL | Status: DC
  Administered 2018-10-28 – 2018-10-30 (×3): 1 via RECTAL
  Filled 2018-10-28 (×3): qty 1

## 2018-10-28 MED ORDER — POLYETHYLENE GLYCOL 3350 17 G PO PACK
17.0000 g | PACK | Freq: Two times a day (BID) | ORAL | Status: DC
  Administered 2018-10-28 – 2018-10-30 (×5): 17 g via ORAL
  Filled 2018-10-28 (×5): qty 1

## 2018-10-28 NOTE — Progress Notes (Signed)
[facility] Progress Note  Aaron [name] 53 y.o. [birth year]  CC: Ileus   Subjective: Complaining of worsening nausea.  Had a few episodes of vomiting after clamping NG tube yesterday.  Currently NG tube at intermittent suction.  No bowel movement since yesterday.  Passing flatus.    Objective: Vital signs in last 24 hours: Vitals:   10/28/18 0529 10/28/18 0842  BP: 91/62 93/63  Pulse: 73 82  Resp: 20   Temp: (!) 97.5 F (36.4 C)   SpO2: 94%     Physical Exam:  General.  Currently getting physical therapy.  No acute distress. ABD :   Soft.  Hypoactive bowel sounds, right upper quadrant discomfort on palpation bowel sounds present.  Abdomen remains distended. Neuro.  Alert/oriented x3.  Not in acute distress Psych :   Mood and affect normal   Lab Results: Recent Labs    10/27/18 0550 10/28/18 0433  NA 132* 133*  K 3.4* 3.9  CL 100 100  CO2 23 25  GLUCOSE 166* 154*  BUN 15 16  CREATININE 0.77 0.94  CALCIUM 7.6* 7.9*  MG  --  1.9   No results for input(s): AST, ALT, ALKPHOS, BILITOT, PROT, ALBUMIN in the last 72 hours. Recent Labs    10/27/18 0550 10/28/18 0433  WBC 13.3* 13.7*  HGB 7.7* 7.7*  HCT 24.2* 24.5*  MCV 91.3 92.5  PLT 216 202   No results for input(s): LABPROT, INR in the last 72 hours.    Assessment/Plan: -Ileus.  Slowly improving.  Last bowel movement yesterday. -Anemia.  EGD 3/ 22 showed  LA grade C Esophagitis and gastritis.  Hemoglobin stable.  No active bleeding. -History of coronary artery disease. -Atrial  Fibrillation and right thigh hematoma.  Recommendations -------------------------- -Patient developed worsening nausea and vomiting after clamping NG tube yesterday.  -Discussed with nursing staff.  Attempt to try clamping NG tube again today.  -X-ray showed chronic ileus without any worsening or improvement.  -Increase MiraLAX to twice a day.  -Daily fleets enema.  -Repeat x-ray in couple days.  -Okay to have  clear liquid diet if tolerated while clamping NG tube.   -Okay to start anticoagulation from GI standpoint.  So far hemoglobin stable on aspirin and prophylactic/DVT dose Lovenox.  -Monitor H&H.  GI will follow   Kathi Der MD, FACP 10/28/2018, 11:51 AM  Contact #  [phone]

## 2018-10-28 NOTE — Progress Notes (Signed)
10/28/18 1300  PT Visit Information  Last PT Received On 10/28/18  Assistance Needed +2  History of Present Illness Pt is a 53 y.o. male admitted 10/01/18 with progressive fluid retention, abdominal/BLE swelling. Worked up for anasarca and CHF with EF 15%. Transferred to [facility] for cardiac cath 3/13, found to have multivessel CAD; not a candidate for CABG. CT 3/19 with acute R thigh hematoma; planned PCI deferred secondary to this. Pt with positive gastric occult blood; KUB shows ileus, s/p NGT placement. PMH includes obesity.  Precautions  Precautions Fall  Precaution Comments monitor O2 and HR, Vital Go tilt bed  Required Braces or Orthoses Other Brace  Other Brace RLE Bledsoe brace locked in extension when up  Restrictions  Weight Bearing Restrictions Yes  RLE Weight Bearing WBAT  Pain Assessment  Pain Assessment 0-10  Faces Pain Scale 4  Pain Location abdomen, nausea  Pain Descriptors / Indicators Discomfort;Grimacing  Pain Intervention(s) Limited activity within patient's tolerance;Monitored during session;Repositioned  Cognition  Arousal/Alertness Awake/alert  Behavior During Therapy WFL for tasks assessed/performed  Overall Cognitive Status Within Functional Limits for tasks assessed  Bed Mobility  General bed mobility comments tilt bed with x3 straps. pt with strap at chest, above knee and calf area. Recommend next session to place straps closer together so that the 3rd strap acts more as a patella strap just below the knee. Pt tolerated HOB elevation progression to 55 degrees  Transfers  Overall transfer level Needs assistance  Equipment used  (VitalGo tilt bed)  Transfers Sit to/from Stand  Sit to Stand From elevated surface;Total assist;+2 physical assistance  General transfer comment Tilt bed with x3 straps at chest, mid thigh and below knee. Tolerated tilt 45 degrees for 2 min wtih BP 99/81.  Then 55 degrees for 3 min wiht BP 91/72.  then tilt 48 degrees for 2 min at 103/73.   Last BP at 10 min was 93/54.  Total time 10 minutes tilt.    Balance  Standing balance support No upper extremity supported;During functional activity  Standing balance-Leahy Scale Zero  Standing balance comment supported by Vital Go tilt bed  General Exercises - Lower Extremity  Ankle Circles/Pumps AROM;Both;Supine;20 reps  Quad Sets AROM;Both;20 reps;Standing  Gluteal Sets AROM;Both;20 reps;Standing  Other Exercises  Other Exercises Pt constantly doing quad sets and glut sets while standing in the tilt bed.    PT - End of Session  Equipment Utilized During Treatment Gait belt;Oxygen  Activity Tolerance Patient tolerated treatment well  Patient left in bed;with call bell/phone within reach  Nurse Communication Mobility status   PT - Assessment/Plan  PT Plan Current plan remains appropriate  PT Visit Diagnosis Muscle weakness (generalized) (M62.81);Difficulty in walking, not elsewhere classified (R26.2);Pain  Pain - part of body  (abdomen)  PT Frequency (ACUTE ONLY) Min 3X/week  Follow Up Recommendations CIR;Supervision/Assistance - 24 hour  PT equipment None recommended by PT  AM-PAC PT "6 Clicks" Mobility Outcome Measure (Version 2)  Help needed turning from your back to your side while in a flat bed without using bedrails? 2  Help needed moving from lying on your back to sitting on the side of a flat bed without using bedrails? 2  Help needed moving to and from a bed to a chair (including a wheelchair)? 1  Help needed standing up from a chair using your arms (e.g., wheelchair or bedside chair)? 1  Help needed to walk in hospital room? 1  Help needed climbing 3-5 steps with a  railing?  1  6 Click Score 8  Consider Recommendation of Discharge To: CIR/SNF/LTACH  PT Goal Progression  Progress towards PT goals Progressing toward goals  PT Time Calculation  PT Start Time (ACUTE ONLY) 1134  PT Stop Time (ACUTE ONLY) 1159  PT Time Calculation (min) (ACUTE ONLY) 25 min  PT General  Charges  $$ ACUTE PT VISIT 1 Visit  PT Treatments  $Therapeutic Exercise 8-22 mins  $Therapeutic Activity 8-22 mins   Pt currently with functional limitations due to balance and endurance deficits. Pt was able to tolerate stand in tilt bed 10 min total this 2nd attempt today.  Progressing and is very motivated each visit.   Pt will benefit from skilled PT to increase their independence and safety with mobility.    Aaron [name],PT Acute Rehabilitation Services Pager:  [phone]  Office:  [phone]

## 2018-10-28 NOTE — Progress Notes (Signed)
Physical Therapy Treatment Patient Details Name: Aaron [name] MRN: [id number] DOB: 03-14-1966 Today's Date: 10/28/2018    History of Present Illness Pt is a 53 y.o. male admitted 10/01/18 with progressive fluid retention, abdominal/BLE swelling. Worked up for anasarca and CHF with EF 15%. Transferred to [facility] for cardiac cath 3/13, found to have multivessel CAD; not a candidate for CABG. CT 3/19 with acute R thigh hematoma; planned PCI deferred secondary to this. Pt with positive gastric occult blood; KUB shows ileus, s/p NGT placement. PMH includes obesity.    PT Comments    Pt admitted with above diagnosis. Pt currently with functional limitations due to balance and endurance deficits. Pt was able to tolerate 15 minutes of tilt with max 60 degrees.  Pt progressing and is limited a bit by nausea and decr BP.   Pt is highly motivated and always wants to participate fully.  Pt will benefit from skilled PT to increase their independence and safety with mobility to allow discharge to the venue listed below.     Follow Up Recommendations  CIR;Supervision/Assistance - 24 hour     Equipment Recommendations  None recommended by PT    Recommendations for Other Services       Precautions / Restrictions Precautions Precautions: Fall Precaution Comments: monitor O2 and HR, Vital Go tilt bed Required Braces or Orthoses: Other Brace Other Brace: RLE Bledsoe brace locked in extension when up Restrictions Weight Bearing Restrictions: Yes RLE Weight Bearing: Weight bearing as tolerated    Mobility  Bed Mobility               General bed mobility comments: tilt bed with x3 straps. pt with strap at chest, above knee and calf area. Recommend next session to place straps closer together so that the 3rd strap acts more as a patella strap just below the knee. Pt tolerated HOB elevation progression to 60 degrees  Transfers Overall transfer level: Needs assistance Equipment used: (VitalGo tilt  bed) Transfers: Sit to/from Stand Sit to Stand: From elevated surface;Total assist;+2 physical assistance         General transfer comment: Tilt bed with x3 straps at chest, mid thigh and below knee. Tolerated tilt 45 degrees for 8 min wiht BP 94/65.  Then 60 degrees for 2 min wiht BP 84/63.  then tilt 50 degrees for 5 min at 90/61.  Total time 15 minutes tilt.    Ambulation/Gait                 Stairs             Wheelchair Mobility    Modified Rankin (Stroke Patients Only)       Balance           Standing balance support: No upper extremity supported;During functional activity Standing balance-Leahy Scale: Zero Standing balance comment: supported by Vital Go tilt bed                            Cognition Arousal/Alertness: Awake/alert Behavior During Therapy: WFL for tasks assessed/performed Overall Cognitive Status: Within Functional Limits for tasks assessed                                        Exercises General Exercises - Lower Extremity Ankle Circles/Pumps: AROM;Both;Supine;20 reps Quad Sets: AROM;Both;20 reps;Standing Gluteal Sets: AROM;Both;20 reps;Standing Heel Slides: Both;10 reps;AROM;Supine  Other Exercises Other Exercises: Pt constantly doing quad sets and glut sets while standing in the tilt bed.      General Comments        Pertinent Vitals/Pain Pain Assessment: Faces Faces Pain Scale: Hurts little more Pain Location: abdomen, nausea Pain Descriptors / Indicators: Discomfort;Grimacing Pain Intervention(s): Limited activity within patient's tolerance;Monitored during session;Repositioned    Home Living                      Prior Function            PT Goals (current goals can now be found in the care plan section) Progress towards PT goals: Progressing toward goals    Frequency    Min 3X/week      PT Plan Current plan remains appropriate    Co-evaluation               AM-PAC PT "6 Clicks" Mobility   Outcome Measure  Help needed turning from your back to your side while in a flat bed without using bedrails?: A Lot Help needed moving from lying on your back to sitting on the side of a flat bed without using bedrails?: A Lot Help needed moving to and from a bed to a chair (including a wheelchair)?: Total Help needed standing up from a chair using your arms (e.g., wheelchair or bedside chair)?: Total Help needed to walk in hospital room?: Total Help needed climbing 3-5 steps with a railing? : Total 6 Click Score: 8    End of Session Equipment Utilized During Treatment: Gait belt;Oxygen Activity Tolerance: Patient tolerated treatment well Patient left: in bed;with call bell/phone within reach Nurse Communication: Mobility status PT Visit Diagnosis: Muscle weakness (generalized) (M62.81);Difficulty in walking, not elsewhere classified (R26.2);Pain Pain - part of body: (abdomen)     Time: 1601-0932 PT Time Calculation (min) (ACUTE ONLY): 27 min  Charges:  $Therapeutic Exercise: 8-22 mins $Therapeutic Activity: 8-22 mins                     Aribelle Mccosh,PT Acute Rehabilitation Services Pager:  [phone]  Office:  [phone]     Berline Lopes 10/28/2018, 10:23 AM

## 2018-10-28 NOTE — Progress Notes (Signed)
PROGRESS NOTE  [employer]:[id number] DOB: [birth year] DOA: 10/01/2018 PCP: [facility] [place]  Brief History   Prescott Parma a 53 y.o.malewith significant past medical history other than obesity who previously worked as a Radiation protection practitioner [facility] 5 with anasarca and 100 pound weight gain. He also reported abdominal pain/intermittent diarrhea and bloating on admission which he related to? IBS. Wife apparently expressed concern about mild confusion at home at the time of admission. In the ED patient was noted to be tachycardic, labs revealed AKI with BUN of 53, creatinine 1.4, his LFTs were slightly elevated in 200s and BNP was elevated at 2769. CT abdomen/pelvis showed diffuse anasarca, ascites and small bilateral pleural effusions. Admission EKG showed normal sinus rhythm with T wave inversions in inferolateral leads. Patient was admitted for further work-up of anasarca. Echocardiogram revealed EF of 15% with diffuse hypokinesis with left atrial moderately dilated, moderate MR and mild AR. Patient was managed with Coreg and Lasix IV/milrinone drip/Spironolactone. Cardiology was consulted.Hospital course was complicated by A. fib with RVR requiring heparin drip.Patient subsequently underwent cardiac cath on March 13 which revealed severe three-vessel CAD (Moderate mid LAD disease, tight distal lesion beyond reach of IMA. Severe disease in circumflex. RCA totally occluded).PCI of LAD/LCx versus CABG was considered but cardiothoracic surgery evaluated patient and did not feel he is candidate for CABG, recommended aggressive medical therapy. Patient was maintained on aspirin,statins and Heparin.On March 18 patient developed pain in his right thigh and noticed a hard swelling for which CT lower extremity was obtained revealing a hematoma. Orthopedics was consulted who recommended nonoperative managementof hematoma and felt patient might have  resultant temporary femoral nerve paralysis. Hospital course also complicated by upper GI bleed around the same time prompting GI evaluation. In view of hypotension/anemia, hematoma and upper GI bleed, heparin was held, PCI deferred and patient underwent EGD on 3/22revealing small esophageal ulcers, gastritis. Serial x-rays and CT abdomen suggestive of adynamic ileus/bowel obstruction. NG tube placed. Patient transferred back to [facility] due to ongoing multiple medical conditions.   In the last 24 hours an attempt was made to clamp the NGT which resulted in increased discomfort and nausea. The clamp was removed. Today again there is an attempt to clamp the tube. The patient may be advanced to a clear liquid diet if this is tolerated.  Consultants   Cardiology/advanced heart failure team  Cardiothoracic surgery  Orthopedic surgery  GI  Procedures   LHC on 10/09/2018: Severe three vessel disease  PCI/CABG considered, but pt is deemed a poor candidate. Medicall therapy only.  EGD 3/22 for GI bleed: small esophageal ulcers, gastritis.  Antibiotics    Anti-infectives (From admission, onward)     Start        Dose/Rate  Route  Frequency  Ordered  Stop     10/01/18 1515    cefTRIAXone (ROCEPHIN) 1 g in sodium chloride 0.9 % 100 mL IVPB       1 g  200 mL/hr over 30 Minutes  Intravenous   Once  10/01/18 1502  10/01/18 2311     10/01/18 1515    azithromycin (ZITHROMAX) tablet 500 mg       500 mg  Oral   Once  10/01/18 1502  10/01/18 1510       Interval History/Subjective  The patient states that he is interested in advancing to clear liquid diet, although he admits discomfort and nausea with clamping the tube. No new complaints.  Objective   Vitals:  Vitals:   10/28/18 1300 10/28/18 1639  BP: 108/65 (!) 95/59  Pulse: 89 91  Resp: 20 16  Temp: (!) 97.4 F (36.3 C) 97.6 F (36.4 C)  SpO2: 100% 100%    Exam:  Constitutional:   The patient  is awake, alert, and oriented x 3. No acute distress.   Respiratory:   No wheezes, rales, or rhonchi.  No tactile fremitus.  No increased work of breathing. Cardiovascular:   Regular rate and rhythm.  No murmurs,ectopy, or gallups  No LE extremity edema    Normal pedal pulses Abdomen:   Abdomen morbidly obese, non-tender, non-distended.  No hernias, masses, or organomegaly are appreciated.  Normoactive bowel sounds. Musculoskeletal:   No cyanosis, clubbing or edema. Skin:   No rashes, lesions, ulcers  palpation of skin: no induration or nodules Neurologic:   CN 2-12 intact Psychiatric:   Mental status o Mood, affect appropriate o Orientation to person, place, time   judgment and insight appear intact     I have personally reviewed the following:   Today's Data   CBC, BMP, Vitals  Scheduled Meds:  aspirin EC  81 mg Oral Daily   carvedilol  3.125 mg Oral BID WC   digoxin  0.125 mg Oral Daily   enoxaparin (LOVENOX) injection  30 mg Subcutaneous Q24H   feeding supplement (ENSURE ENLIVE)  237 mL Oral TID BM   hydrocortisone  25 mg Rectal BID   losartan  25 mg Oral Daily   mouth rinse  15 mL Mouth Rinse BID   metoCLOPramide (REGLAN) injection  10 mg Intravenous Q8H   nicotine  21 mg Transdermal Daily   pantoprazole (PROTONIX) IV  40 mg Intravenous Q12H   polyethylene glycol  17 g Oral BID   sodium chloride flush  10-40 mL Intracatheter Q12H   sodium chloride flush  3 mL Intravenous Q12H   sodium phosphate  1 enema Rectal Daily   spironolactone  25 mg Oral Daily   Continuous Infusions:  sodium chloride 250 mL (10/27/18 0837)   amiodarone 30 mg/hr (10/28/18 1411)    Principal Problem:   Acute systolic CHF (congestive heart failure) (HCC) Active Problems:   Anasarca   Elevated LFTs   AKI (acute kidney injury) (HCC)   Acute hypoxemic respiratory failure (HCC)   Morbid obesity (HCC)   Atrial fibrillation with rapid ventricular  response (HCC)   Shortness of breath   Sinus tachycardia   Tachypnea   Hypokalemia   Protein-calorie malnutrition, severe   A & P   Ileus/small bowel obstruction: Pt passing some flatus, but still with discomfort with clamping of tube. May advance to clears if patient is able to tolerate re-clamping of tube.   Acute blood loss anemia due to GI bleed: Grade C esophagitis, non-bleeding ulcers, and erosive gastropathy found on EGD. Continue PPI and monitor hemoglobin. Transfuse for less than 7.0. Hemoglobin 7.7 today.  Right thigh hematoma: Seen by orthopedics who recommended a Blodsoe brace. Improving slowly. Compressive wraps to lower extremities.   Multivessel CAD: ASA daily  Acute systolic CHF: CMO with EF 10-15%. Initially required milrinone gtt in ICU. Now on digoxin, cozaar, and aldactone. Monitor volume status.  AKI: Resolved. Creatinine 0.94 today.  New onset atrial fibrillation/NSVT: Left atrial enlargement and low EF on echo. Pis is on amiodarone and digoxin for rate control. No anticoagulation due to GI bleed and right thigh hematoma. We appreciate cardiology's assistance.  Morbid obesity: Greatly complicates all cares.  Recommend that the patient pursue a program of sensible weight loss guided by his PCP upon discharge.  Tobacco abuse: Nicotine patch.  Hypokalemia: Monitor and supplement as necessary.  Leukocytosis: Likely due to hematoma/GI bleed/Ileus. Monitor. Afebrile  I have seen and examined this patient myself. I have spent 38 minutes in his evaluation and care.  DVT prophylaxis: Lovenox Code Status: Full Code Family Communication: None present Disposition Plan: tbd  Raha Tennison, DO [facility] Direct contact: see [email or web address]  7PM-7AM contact night coverage as above 10/28/2018, 6:46 PM  LOS: 27 days     LOS: 27 days

## 2018-10-28 NOTE — Progress Notes (Signed)
NG tube has no suction since 1pm; still no nausea or vomiting. Will continue to keep suction off at time time.   Sheppard Evens RN

## 2018-10-28 NOTE — Progress Notes (Signed)
Advanced Heart Failure Rounding Note   Subjective:    Still with NGT in place. Tolerating liquids. Still with intermittent nausea. KUB with colonic dilation (no change) - felt to be chronic ileus. No CP, SOB working with PT.  Remains on IV amio. AF rates 80-90.   Objective:   Weight Range:  Vital Signs:   Temp:  [97.5 F (36.4 C)-98.1 F (36.7 C)] 97.5 F (36.4 C) (04/01 0529) Pulse Rate:  [73-86] 82 (04/01 0842) Resp:  [17-20] 20 (04/01 0529) BP: (78-99)/(51-64) 93/63 (04/01 0842) SpO2:  [94 %-100 %] 94 % (04/01 0529) Weight:  [134.7 kg] 134.7 kg (04/01 0529) Last BM Date: 10/25/18(has fecal pouch)  Weight change: Filed Weights   10/26/18 0327 10/27/18 0630 10/28/18 0529  Weight: (!) 137.9 kg 136.1 kg 134.7 kg   Intake/Output:   Intake/Output Summary (Last 24 hours) at 10/28/2018 0858 Last data filed at 10/28/2018 0848 Gross per 24 hour  Intake 568.37 ml  Output 1000 ml  Net -431.63 ml    Physical Exam   General:  Lying in bed. No resp difficulty HEENT: normal +NGT Neck: supple. no JVD. Carotids 2+ bilat; no bruits. No lymphadenopathy or thryomegaly appreciated. Cor: PMI nondisplaced. Irrgular rate & rhythm. No rubs, gallops or murmurs. Lungs: clear Abdomen: obese soft, nontender, nondistended. No hepatosplenomegaly. No bruits or masses. Hypoactive bowel sounds. Extremities: no cyanosis, clubbing, rash, edema + UNNA boots Neuro: alert & orientedx3, cranial nerves grossly intact. moves all 4 extremities w/o difficulty. Affect pleasant  Telemetry   Afib 80-90s, personally reviewed.    Labs    Basic Metabolic Panel: Recent Labs  Lab 10/24/18 0333 10/25/18 0509 10/26/18 0405 10/27/18 0550 10/28/18 0433  NA 134* 134* 132* 132* 133*  K 3.8 3.2* 3.3* 3.4* 3.9  CL 103 103 102 100 100  CO2 23 25 23 23 25   GLUCOSE 105* 105* 141* 166* 154*  BUN 19 18 16 15 16   CREATININE 1.07 0.94 0.82 0.77 0.94  CALCIUM 7.9* 7.7* 7.5* 7.6* 7.9*  MG  --  2.0  --   --   1.9  PHOS  --  2.7  --   --   --    Liver Function Tests: No results for input(s): AST, ALT, ALKPHOS, BILITOT, PROT, ALBUMIN in the last 168 hours. No results for input(s): LIPASE, AMYLASE in the last 168 hours. No results for input(s): AMMONIA in the last 168 hours.  CBC: Recent Labs  Lab 10/24/18 0333 10/25/18 0509 10/26/18 0405 10/27/18 0550 10/28/18 0433  WBC 14.4* 14.5* 14.1* 13.3* 13.7*  HGB 8.2* 7.9* 7.8* 7.7* 7.7*  HCT 26.5* 24.5* 25.3* 24.2* 24.5*  MCV 89.5 89.7 90.7 91.3 92.5  PLT 295 260 230 216 202   Cardiac Enzymes: No results for input(s): CKTOTAL, CKMB, CKMBINDEX, TROPONINI in the last 168 hours.  BNP: BNP (last 3 results) Recent Labs    10/01/18 1337 10/01/18 1720  BNP 2,769.2* 2,333.1*   ProBNP (last 3 results) No results for input(s): PROBNP in the last 8760 hours.  Other results:  Imaging: Dg Abd 1 View  Result Date: 10/27/2018 CLINICAL DATA:  Ileus.  Morbid obesity. EXAM: ABDOMEN - 1 VIEW COMPARISON:  10/26/2018 FINDINGS: Nasogastric tube is again seen with tip overlying the distal stomach. Diffuse dilatation of the colon shows no significant change, most likely due to chronic ileus. IMPRESSION: Probable colonic ileus, without significant change. Electronically Signed   By: Myles Rosenthal M.D.   On: 10/27/2018 19:19  Medications:    Scheduled Medications: . aspirin EC  81 mg Oral Daily  . carvedilol  3.125 mg Oral BID WC  . digoxin  0.125 mg Oral Daily  . enoxaparin (LOVENOX) injection  30 mg Subcutaneous Q24H  . feeding supplement (ENSURE ENLIVE)  237 mL Oral TID BM  . hydrocortisone  25 mg Rectal BID  . losartan  25 mg Oral Daily  . mouth rinse  15 mL Mouth Rinse BID  . metoCLOPramide (REGLAN) injection  10 mg Intravenous Q8H  . nicotine  21 mg Transdermal Daily  . pantoprazole (PROTONIX) IV  40 mg Intravenous Q12H  . polyethylene glycol  17 g Oral Daily  . sodium chloride flush  10-40 mL Intracatheter Q12H  . sodium chloride flush  3  mL Intravenous Q12H  . spironolactone  25 mg Oral Daily    Infusions: . sodium chloride 250 mL (10/27/18 0837)  . amiodarone 30 mg/hr (10/28/18 0600)    PRN Medications: sodium chloride, acetaminophen, alum & mag hydroxide-simeth, ondansetron (ZOFRAN) IV, sodium chloride flush, sodium chloride flush, witch hazel-glycerin  Assessment:   Aaron [name] is a 53 y.o. male with h/o of obesity who presented with c/o fluid retention, abdominal swelling, and peripheral edema x 3-4 weeks.  Echo showed new systolic CHF 15%.    CHF team consulted for follow up and treatment. Cath results pending at time of consultation.   Plan/Discussion:    1. Acute systolic CHF -> cardiogenic shock due to iCM - Echo 10/02/18 LVEF 15%, Normal RV, Mod LAE, Mod MR, Mild AI, Mild PI.  - Cath 3/13 showed severe 3v CAD with markedly decompensated hemodynamics - Off milrinone - Hf stable.  - Volume status and weight stable off diuretics. Will likely need to restart diuretics once taking more po.  - Remains on losartan, spiro, carvedilol and digoxin.  - Continue UNNA boots  2. CAD - Severe 3v CAD as above. Has been seen by Dr. Dorris Fetch ([facility]). Not CABG candidate currently given hemodynamics and lack of mobility. Plan was for PCI of LCX and LAD but given large R leg hematoma now plan for medical management,  - No s/s of ischemia despite physiologic stressors.  - Continue ASA,b-blocker and statin. Plavix stopped due to leg bleed.   3. Afib with RVR, new - Off milrinone. - Back on IV amio at 30 given NPO status and elevated rates. HR improved. Will switch to amio 200 bid once NGT is out.  - This patients CHA2DS2-VASc Score is at least 3.  - Anticoag on hold now with hematoma and UGI bleeding. Now toelrating DVT dose lovenox.  - I think he would likely tolerate Eliquis but will wait until he is a bit more mobile and steady on his feet. Can make the switch in CIR   4. Acute large R thigh hematoma with acute  blood loss anemia - Heparin off. Ortho has seen -> appreciate their recs  - Has involvement of rectus femoris muscle and femoral nerve.  - Brace applied. - Hgb stable at 7.7 . Will continue to follow. Transfuse under 7.5 - Continue SCDs.  - Continue DVT dose lovenox  5.  Acute ileus/SBO with Upper GI bleed - GI and TRH managing (thank you) - EGD results 3/22 and CT scan 3/23 results  noted.  - KUB suggests chronic ileus. GI managing   4. AKI - Resolved  5. Hypokalemia - K 3.9 today. Not tolerating oral sup well.  6. Severe deconditioning due to obesity -  PT and CIR following.   CAD & HF issues relatively stable. The HF team will follow intermittently. Please call with questions.    Length of Stay: 27  Arvilla Meres, MD 10/28/2018, 8:58 AM  Advanced Heart Failure Team Pager [phone] (M-F; 7a - 4p)  Please contact [facility] for night-coverage after hours (4p -7a ) and weekends on [email or web address]

## 2018-10-29 DIAGNOSIS — K567 Ileus, unspecified: Secondary | ICD-10-CM

## 2018-10-29 LAB — BASIC METABOLIC PANEL
Anion gap: 3 — ABNORMAL LOW (ref 5–15)
BUN: 20 mg/dL (ref 6–20)
CO2: 27 mmol/L (ref 22–32)
Calcium: 7.5 mg/dL — ABNORMAL LOW (ref 8.9–10.3)
Chloride: 101 mmol/L (ref 98–111)
Creatinine, Ser: 0.9 mg/dL (ref 0.61–1.24)
GFR calc Af Amer: >60 mL/min (ref >60)
GFR calc non Af Amer: >60 mL/min (ref >60)
Glucose, Bld: 126 mg/dL — ABNORMAL HIGH (ref 70–99)
Potassium: 3.4 mmol/L — ABNORMAL LOW (ref 3.5–5.1)
Sodium: 131 mmol/L — ABNORMAL LOW (ref 135–145)

## 2018-10-29 LAB — CBC WITH DIFFERENTIAL/PLATELET
Abs Immature Granulocytes: 0.23 10*3/uL — ABNORMAL HIGH (ref 0.00–0.07)
Basophils Absolute: 0 10*3/uL (ref 0.0–0.1)
Basophils Relative: 0 %
Eosinophils Absolute: 0 10*3/uL (ref 0.0–0.5)
Eosinophils Relative: 0 %
HCT: 23.7 % — ABNORMAL LOW (ref 39.0–52.0)
Hemoglobin: 7.1 g/dL — ABNORMAL LOW (ref 13.0–17.0)
Immature Granulocytes: 2 %
Lymphocytes Relative: 6 %
Lymphs Abs: 0.8 10*3/uL (ref 0.7–4.0)
MCH: 27.8 pg (ref 26.0–34.0)
MCHC: 30 g/dL (ref 30.0–36.0)
MCV: 92.9 fL (ref 80.0–100.0)
Monocytes Absolute: 0.9 10*3/uL (ref 0.1–1.0)
Monocytes Relative: 7 %
Neutro Abs: 11.1 10*3/uL — ABNORMAL HIGH (ref 1.7–7.7)
Neutrophils Relative %: 85 %
Platelets: 213 10*3/uL (ref 150–400)
RBC: 2.55 MIL/uL — ABNORMAL LOW (ref 4.22–5.81)
RDW: 17.9 % — ABNORMAL HIGH (ref 11.5–15.5)
WBC: 13 10*3/uL — ABNORMAL HIGH (ref 4.0–10.5)
nRBC: 0 % (ref 0.0–0.2)

## 2018-10-29 LAB — PREPARE RBC (CROSSMATCH)

## 2018-10-29 LAB — HEMOGLOBIN AND HEMATOCRIT, BLOOD
HCT: 25.8 % — ABNORMAL LOW (ref 39.0–52.0)
Hemoglobin: 8 g/dL — ABNORMAL LOW (ref 13.0–17.0)

## 2018-10-29 MED ORDER — NICOTINE 14 MG/24HR TD PT24
14.0000 mg | MEDICATED_PATCH | Freq: Every day | TRANSDERMAL | Status: DC
  Administered 2018-10-30 – 2018-11-05 (×6): 14 mg via TRANSDERMAL
  Filled 2018-10-29 (×7): qty 1

## 2018-10-29 MED ORDER — POTASSIUM CHLORIDE 20 MEQ/15ML (10%) PO SOLN
40.0000 meq | ORAL | Status: AC
  Administered 2018-10-29 (×2): 40 meq via ORAL
  Filled 2018-10-29 (×2): qty 30

## 2018-10-29 MED ORDER — FUROSEMIDE 10 MG/ML IJ SOLN
40.0000 mg | Freq: Once | INTRAMUSCULAR | Status: AC
  Administered 2018-10-29: 40 mg via INTRAVENOUS
  Filled 2018-10-29: qty 4

## 2018-10-29 MED ORDER — SODIUM CHLORIDE 0.9% IV SOLUTION: Freq: Once | INTRAVENOUS | Status: DC

## 2018-10-29 NOTE — Progress Notes (Signed)
Physical Therapy Treatment Patient Details Name: Aaron [name] MRN: [id number] DOB: 10-Mar-1966 Today's Date: 10/29/2018    History of Present Illness Pt is a 53 y.o. male admitted 10/01/18 with progressive fluid retention, abdominal/BLE swelling. Worked up for anasarca and CHF with EF 15%. Transferred to [facility] for cardiac cath 3/13, found to have multivessel CAD; not a candidate for CABG. CT 3/19 with acute R thigh hematoma; planned PCI deferred secondary to this. Pt with positive gastric occult blood; KUB shows ileus, s/p NGT placement. PMH includes obesity.   PT Comments    Pt progressing well with mobility. Continues to tolerate core/BLE therex while partial standing in tilt bed. BP stable while tilting to 45'. Mobility limited today due to NGT becoming dislodged awaiting RN to adjust. RN present for some training with bed; hope to reinforce RN education with tilt bed in additional afternoon session.   Follow Up Recommendations  CIR;Supervision/Assistance - 24 hour     Equipment Recommendations  None recommended by PT    Recommendations for Other Services       Precautions / Restrictions Precautions Precautions: Fall Precaution Comments: Watch BP; VitalGo tilt bed Required Braces or Orthoses: Other Brace Other Brace: RLE Bledsoe brace locked in extension when up Restrictions Weight Bearing Restrictions: Yes RLE Weight Bearing: Weight bearing as tolerated    Mobility  Bed Mobility                  Transfers Overall transfer level: Needs assistance               General transfer comment: Tilt bed with x3 straps at chest, mid thigh and below knee. Pt awaiting blood transfusion, therefore more conservative with tilt to monitor BP. Supine BP 102/63, tilt 15' BP 100/78, tilt 30' BP 91/62, tilt 45' BP 102/69. Tolerated well although feeling as if NGT coming out even further; further tilt deferred as RN present to fix NGT   Ambulation/Gait                 Stairs              Wheelchair Mobility    Modified Rankin (Stroke Patients Only)       Balance                                            Cognition Arousal/Alertness: Awake/alert Behavior During Therapy: Flat affect Overall Cognitive Status: Within Functional Limits for tasks assessed                                 General Comments: Distracted by concern NGT is almost out (awaiting RN to fix)      Exercises General Exercises - Lower Extremity Quad Sets: AROM;Both;Standing Gluteal Sets: AROM;Both;Standing Toe Raises: AROM;Both;Standing(partial) Heel Raises: AROM;Both;Standing(partial)    General Comments        Pertinent Vitals/Pain Pain Assessment: Faces Faces Pain Scale: Hurts a little bit Pain Location: Abdomen Pain Descriptors / Indicators: Discomfort;Grimacing Pain Intervention(s): Monitored during session;Limited activity within patient's tolerance    Home Living                      Prior Function            PT Goals (current goals can now be found in the care  plan section) Progress towards PT goals: Progressing toward goals    Frequency    Min 3X/week      PT Plan Current plan remains appropriate    Co-evaluation              AM-PAC PT "6 Clicks" Mobility   Outcome Measure  Help needed turning from your back to your side while in a flat bed without using bedrails?: A Lot Help needed moving from lying on your back to sitting on the side of a flat bed without using bedrails?: A Lot Help needed moving to and from a bed to a chair (including a wheelchair)?: Total Help needed standing up from a chair using your arms (e.g., wheelchair or bedside chair)?: Total Help needed to walk in hospital room?: Total Help needed climbing 3-5 steps with a railing? : Total 6 Click Score: 8    End of Session Equipment Utilized During Treatment: Gait belt;Oxygen Activity Tolerance: Patient tolerated treatment  well Patient left: in bed;with call bell/phone within reach;with nursing/sitter in room Nurse Communication: Mobility status PT Visit Diagnosis: Muscle weakness (generalized) (M62.81);Difficulty in walking, not elsewhere classified (R26.2);Pain Pain - part of body: (Abdomen)     Time: 3474-2595 PT Time Calculation (min) (ACUTE ONLY): 25 min  Charges:  $Therapeutic Exercise: 8-22 mins $Therapeutic Activity: 8-22 mins                    Aaron [name], PT, DPT Acute Rehabilitation Services  Pager [phone] Office [phone]  Aaron [name] 10/29/2018, 10:25 AM

## 2018-10-29 NOTE — Progress Notes (Signed)
Pt tolerating standing with Vital go bed x 10 minutes at 45-50 degrees. Performed UB exercises while in standing. Pt remains highly motivated and an excellent rehab candidate.   10/29/18 1532  OT Visit Information  Last OT Received On 10/29/18  Assistance Needed +2  PT/OT/SLP Co-Evaluation/Treatment Yes  Reason for Co-Treatment Complexity of the patient's impairments (multi-system involvement);For patient/therapist safety  History of Present Illness Pt is a 53 y.o. male admitted 10/01/18 with progressive fluid retention, abdominal/BLE swelling. Worked up for anasarca and CHF with EF 15%. Transferred to [facility] for cardiac cath 3/13, found to have multivessel CAD; not a candidate for CABG. CT 3/19 with acute R thigh hematoma; planned PCI deferred secondary to this. Pt with positive gastric occult blood; KUB shows ileus, s/p NGT placement. PMH includes obesity.  Precautions  Precautions Fall  Precaution Comments Watch BP; VitalGo tilt bed  Required Braces or Orthoses Other Brace  Other Brace RLE Bledsoe brace locked in extension when up  Pain Assessment  Pain Assessment Faces  Faces Pain Scale 2  Pain Location Abdomen  Pain Descriptors / Indicators Discomfort;Grimacing  Pain Intervention(s) Monitored during session;Repositioned  Cognition  Arousal/Alertness Awake/alert  Behavior During Therapy WFL for tasks assessed/performed  Overall Cognitive Status Within Functional Limits for tasks assessed  Bed Mobility  General bed mobility comments assisted to pull up in bed with UEs on headboard  Balance  Standing balance comment supported by Vital Go tilt bed  Restrictions  Weight Bearing Restrictions Yes  RLE Weight Bearing WBAT  Transfers  General transfer comment Tilt bed with x3 straps at chest, mid thigh and below knee. Pt awaiting blood transfusion, therefore more conservative with tilt to monitor BP. Supine BP 107/91, tilt 45' BP 88/57 up to 90/62 with standing therex, tilt 50' BP 91/64.  Tolerated ~25 minutes total with standing UE/LE/core therex  Exercises  Exercises General Upper Extremity  General Exercises - Upper Extremity  Shoulder Horizontal ABduction Both;10 reps;Supine;Theraband;Strengthening  Elbow Flexion Strengthening;Both;10 reps;Standing;Theraband  Elbow Extension Both;10 reps;Theraband;Strengthening;Standing  Theraband Level (Shoulder Horizontal Abduction) Level 1 (Yellow)  Theraband Level (Elbow Flexion) Level 1 (Yellow)  Theraband Level (Elbow Extension) Level 1 (Yellow)  OT - End of Session  Equipment Utilized During Treatment Oxygen  Activity Tolerance Patient tolerated treatment well  Patient left in bed;with call bell/phone within reach (in chair position)  OT Assessment/Plan  OT Plan Discharge plan remains appropriate  OT Visit Diagnosis Unsteadiness on feet (R26.81);Other abnormalities of gait and mobility (R26.89);Muscle weakness (generalized) (M62.81);Pain  OT Frequency (ACUTE ONLY) Min 3X/week  Follow Up Recommendations CIR;Supervision/Assistance - 24 hour  AM-PAC OT "6 Clicks" Daily Activity Outcome Measure (Version 2)  Help from another person eating meals? 4  Help from another person taking care of personal grooming? 3  Help from another person toileting, which includes using toliet, bedpan, or urinal? 1  Help from another person bathing (including washing, rinsing, drying)? 2  Help from another person to put on and taking off regular upper body clothing? 3  Help from another person to put on and taking off regular lower body clothing? 1  6 Click Score 14  OT Goal Progression  Progress towards OT goals Progressing toward goals  Acute Rehab OT Goals  Patient Stated Goal to get stronger  OT Goal Formulation With patient  Time For Goal Achievement 11/09/18  Potential to Achieve Goals Good  OT Time Calculation  OT Start Time (ACUTE ONLY) 1130  OT Stop Time (ACUTE ONLY) 1157  OT Time Calculation (min)  27 min  OT General Charges  $OT  Visit 1 Visit  OT Treatments  $Therapeutic Exercise 8-22 mins  Martie Round, OTR/L Acute Rehabilitation Services Pager: [phone] Office: [phone]

## 2018-10-29 NOTE — Progress Notes (Signed)
PROGRESS NOTE    Aaron [name]   PPI:[id number]  DOB: 10/11/1965  DOA: 10/01/2018 PCP: [facility] @ [place]   Brief Narrative:  Aaron [name] 53 y.o.malewith significant past medical history other than obesity who previously worked as a Radiation protection practitioner presented to this [facility] on March 5 with anasarca and 100 pound weight gain. In the ED patient was noted to be tachycardic, labs revealed AKI with BUN of 53, creatinine 1.4, his LFTs were slightly elevated in 200s and BNP was elevated at 2769. CT abdomen/pelvis showed diffuse anasarca, ascites and small bilateral pleural effusions.  Patient was admitted for further work-up of anasarca. Echocardiogram revealed EF of 15% with diffuse hypokinesis with left atrial moderately dilated, moderate MR and mild AR. Patient was managed with Coreg and Lasix IV/milrinone drip/Spironolactone.   Hospital course was complicated by A. fib with RVR requiring heparin drip.  Patient subsequently underwent cardiac cath on March 13 which revealed severe three-vessel CAD (Moderate mid LAD disease, tight distal lesion beyond reach of IMA. Severe disease in circumflex. RCA totally occluded).PCI of LAD/LCx versus CABG was considered but cardiothoracic surgery evaluated patient and did not feel he is candidate for CABG, recommended aggressive medical therapy.   On March 18 patient developed pain in his right thigh and noticed a hard swelling for which CT lower extremity was obtained revealing a hematoma.   Hospital course also complicated by upper GI bleed around the same time prompting GI evaluation- EGD on 3/22revealing small esophageal ulcers, gastritis.  Subsequently found to have an adynamic ileus/bowel obstruction. NG tube placed. Patient transferred back to [facility] due to ongoing multiple medical conditions.   Subjective: The patient has no complaints of abdominal discomfort and is tolerating clear liquids.  He does still have some  discomfort in his left thigh and is unable to lift his left leg.    Assessment & Plan:   Principal Problem:   Acute systolic CHF (congestive heart failure) /   Anasarca - EF 10-15 % - has resolved - currently on daily Aldactone 25 mg and maintaining weight/ fluid balance Active Problems:  Right thigh hematoma - has stabilized he needs PT as he is not able to lift up the right leg  Ileus - NG clamped- per his note, Dr Georgiann Cocker recommended removed the NG todaybut he told GI that he did not want NG removed today  - I also recommended the be removed but he told me that the GI doctor did not want it removed until tomorrow  Acute blood loss anemia - seems to have stopped bleeding but his Hb has drifted down to 1 today- will give him 1 U PRBC with a dose of Lasix  Hypokalemia - replace and recheck tomorrow  A-fib/ NSVT - cannot anticoagulate for now due to above GI bleed and hematomoa - cont Amiodarone and Dig  Morbid obesity Body mass index is 36.03 kg/m. - needs to lose weight   Tobacco abuse - wean nicotine patch from 21 to 14 mg  Time spent in minutes: 35  DVT prophylaxis: Lovenox Code Status: full code Family Communication:  Disposition Plan: CIR likely tomorrow Consultants:   Cardiology  Cardiothoracic surgery  Orthopedic surgery  GI Procedures:   LHC on 10/09/2018: Severe three vessel disease  PCI/CABG considered, but pt is deemed a poor candidate. Medicall therapy only.  EGD 3/22 for GI bleed: small esophageal ulcers, gastritis. Antimicrobials:  Anti-infectives (From admission, onward)   Start     Dose/Rate Route Frequency Ordered  Stop   10/01/18 1515  cefTRIAXone (ROCEPHIN) 1 g in sodium chloride 0.9 % 100 mL IVPB     1 g 200 mL/hr over 30 Minutes Intravenous  Once 10/01/18 1502 10/01/18 2311   10/01/18 1515  azithromycin (ZITHROMAX) tablet 500 mg     500 mg Oral  Once 10/01/18 1502 10/01/18 1510       Objective: Vitals:   10/28/18 2130 10/29/18  0542 10/29/18 0604 10/29/18 0605  BP:  101/64    Pulse:  84    Resp: 19 18    Temp:   98.2 F (36.8 C)   TempSrc:   Axillary   SpO2:  94%    Weight:    134.3 kg  Height:        Intake/Output Summary (Last 24 hours) at 10/29/2018 0748 Last data filed at 10/29/2018 0116 Gross per 24 hour  Intake 1055.78 ml  Output 200 ml  Net 855.78 ml   Filed Weights   10/27/18 0630 10/28/18 0529 10/29/18 0605  Weight: 136.1 kg 134.7 kg 134.3 kg    Examination: General exam: Appears comfortable  HEENT: PERRLA, oral mucosa moist, no sclera icterus or thrush- NG tube in place, clamped. Respiratory system: Clear to auscultation. Respiratory effort normal. Cardiovascular system: S1 & S2 heard, RRR.   Gastrointestinal system: Abdomen soft, non-tender, nondistended. Normal bowel sounds. Central nervous system: Alert and oriented. No focal neurological deficits. Extremities: No cyanosis, clubbing - enlarged left thigh with dependent pitting edema Skin: No rashes or ulcers Psychiatry:  Mood & affect appropriate.   Data Reviewed: I have personally reviewed following labs and imaging studies  CBC: Recent Labs  Lab 10/25/18 0509 10/26/18 0405 10/27/18 0550 10/28/18 0433 10/29/18 0339  WBC 14.5* 14.1* 13.3* 13.7* 13.0*  NEUTROABS  --   --   --   --  11.1*  HGB 7.9* 7.8* 7.7* 7.7* 7.1*  HCT 24.5* 25.3* 24.2* 24.5* 23.7*  MCV 89.7 90.7 91.3 92.5 92.9  PLT 260 230 216 202 213   Basic Metabolic Panel: Recent Labs  Lab 10/25/18 0509 10/26/18 0405 10/27/18 0550 10/28/18 0433 10/29/18 0339  NA 134* 132* 132* 133* 131*  K 3.2* 3.3* 3.4* 3.9 3.4*  CL 103 102 100 100 101  CO2 25 23 23 25 27   GLUCOSE 105* 141* 166* 154* 126*  BUN 18 16 15 16 20   CREATININE 0.94 0.82 0.77 0.94 0.90  CALCIUM 7.7* 7.5* 7.6* 7.9* 7.5*  MG 2.0  --   --  1.9  --   PHOS 2.7  --   --   --   --    GFR: Estimated Creatinine Clearance: 140.4 mL/min (by C-G formula based on SCr of 0.9 mg/dL). Liver Function Tests:  No results for input(s): AST, ALT, ALKPHOS, BILITOT, PROT, ALBUMIN in the last 168 hours. No results for input(s): LIPASE, AMYLASE in the last 168 hours. No results for input(s): AMMONIA in the last 168 hours. Coagulation Profile: No results for input(s): INR, PROTIME in the last 168 hours. Cardiac Enzymes: No results for input(s): CKTOTAL, CKMB, CKMBINDEX, TROPONINI in the last 168 hours. BNP (last 3 results) No results for input(s): PROBNP in the last 8760 hours. HbA1C: No results for input(s): HGBA1C in the last 72 hours. CBG: No results for input(s): GLUCAP in the last 168 hours. Lipid Profile: No results for input(s): CHOL, HDL, LDLCALC, TRIG, CHOLHDL, LDLDIRECT in the last 72 hours. Thyroid Function Tests: No results for input(s): TSH, T4TOTAL, FREET4, T3FREE, THYROIDAB in the  last 72 hours. Anemia Panel: No results for input(s): VITAMINB12, FOLATE, FERRITIN, TIBC, IRON, RETICCTPCT in the last 72 hours. Urine analysis:    Component Value Date/Time   COLORURINE AMBER (A) 10/01/2018 1523   APPEARANCEUR HAZY (A) 10/01/2018 1523   LABSPEC 1.023 10/01/2018 1523   PHURINE 5.0 10/01/2018 1523   GLUCOSEU NEGATIVE 10/01/2018 1523   HGBUR SMALL (A) 10/01/2018 1523   BILIRUBINUR NEGATIVE 10/01/2018 1523   KETONESUR NEGATIVE 10/01/2018 1523   PROTEINUR NEGATIVE 10/01/2018 1523   NITRITE NEGATIVE 10/01/2018 1523   LEUKOCYTESUR NEGATIVE 10/01/2018 1523   Sepsis Labs: @LABRCNTIP (procalcitonin:4,lacticidven:4) )No results found for this or any previous visit (from the past 240 hour(s)).       Radiology Studies: Dg Abd 1 View  Result Date: 10/27/2018 CLINICAL DATA:  Ileus.  Morbid obesity. EXAM: ABDOMEN - 1 VIEW COMPARISON:  10/26/2018 FINDINGS: Nasogastric tube is again seen with tip overlying the distal stomach. Diffuse dilatation of the colon shows no significant change, most likely due to chronic ileus. IMPRESSION: Probable colonic ileus, without significant change.  Electronically Signed   By: Myles Rosenthal M.D.   On: 10/27/2018 19:19      Scheduled Meds: . aspirin EC  81 mg Oral Daily  . carvedilol  3.125 mg Oral BID WC  . digoxin  0.125 mg Oral Daily  . enoxaparin (LOVENOX) injection  30 mg Subcutaneous Q24H  . feeding supplement (ENSURE ENLIVE)  237 mL Oral TID BM  . hydrocortisone  25 mg Rectal BID  . losartan  25 mg Oral Daily  . mouth rinse  15 mL Mouth Rinse BID  . metoCLOPramide (REGLAN) injection  10 mg Intravenous Q8H  . nicotine  21 mg Transdermal Daily  . pantoprazole (PROTONIX) IV  40 mg Intravenous Q12H  . polyethylene glycol  17 g Oral BID  . sodium chloride flush  10-40 mL Intracatheter Q12H  . sodium chloride flush  3 mL Intravenous Q12H  . sodium phosphate  1 enema Rectal Daily  . spironolactone  25 mg Oral Daily   Continuous Infusions: . sodium chloride 250 mL (10/27/18 0837)  . amiodarone 30 mg/hr (10/29/18 0144)     LOS: 28 days      Calvert Cantor, MD [facility] Pager: [email or web address] Password [facility] 10/29/2018, 7:48 AM

## 2018-10-29 NOTE — Progress Notes (Signed)
Physical Therapy Treatment Patient Details Name: Aaron [name] MRN: [id number] DOB: 1966-02-17 Today's Date: 10/29/2018    History of Present Illness Pt is a 53 y.o. male admitted 10/01/18 with progressive fluid retention, abdominal/BLE swelling. Worked up for anasarca and CHF with EF 15%. Transferred to [facility] for cardiac cath 3/13, found to have multivessel CAD; not a candidate for CABG. CT 3/19 with acute R thigh hematoma; planned PCI deferred secondary to this. Pt with positive gastric occult blood; KUB shows ileus, s/p NGT placement. PMH includes obesity.   PT Comments    Pt seen for additional session with til bed. Seen with OT as pt with low BP awaiting blood transfusion, also awaiting enema. Tolerated tilt to 50' with BP down to 88/57, returning to 91/64 with UE/LE/core exercise while tilted. Left bed in chair position. Pt remains very motivated to participate despite not feeling his best. Continue to recommend intensive CIR-level therapies. Hope to progress to sitting EOB activity next session.    Follow Up Recommendations  CIR;Supervision/Assistance - 24 hour     Equipment Recommendations  None recommended by PT    Recommendations for Other Services       Precautions / Restrictions Precautions Precautions: Fall Precaution Comments: Watch BP; VitalGo tilt bed Required Braces or Orthoses: Other Brace Other Brace: RLE Bledsoe brace locked in extension when up Restrictions Weight Bearing Restrictions: Yes RLE Weight Bearing: Weight bearing as tolerated    Mobility  Bed Mobility                  Transfers Overall transfer level: Needs assistance               General transfer comment: Tilt bed with x3 straps at chest, mid thigh and below knee. Pt awaiting blood transfusion, therefore more conservative with tilt to monitor BP. Supine BP 107/91, tilt 45' BP 88/57 up to 90/62 with standing therex, tilt 50' BP 91/64. Tolerated ~25 minutes total with standing UE/LE/core  therex  Ambulation/Gait                 Stairs             Wheelchair Mobility    Modified Rankin (Stroke Patients Only)       Balance Overall balance assessment: Needs assistance                                          Cognition Arousal/Alertness: Awake/alert Behavior During Therapy: WFL for tasks assessed/performed Overall Cognitive Status: Within Functional Limits for tasks assessed                                 General Comments: Distracted by concern NGT is almost out (awaiting RN to fix)      Exercises General Exercises - Lower Extremity Quad Sets: AROM;Both;Standing Gluteal Sets: AROM;Both;Standing Heel Slides: AAROM;Right;Supine Toe Raises: AROM;Both;Standing(partial) Heel Raises: AROM;Both;Standing(partial) Other Exercises Other Exercises: Bilateral hip/trunk extension while tilted    General Comments General comments (skin integrity, edema, etc.): Able to FaceTime with wife while tilted      Pertinent Vitals/Pain Pain Assessment: Faces Faces Pain Scale: Hurts a little bit Pain Location: Abdomen Pain Descriptors / Indicators: Discomfort;Grimacing Pain Intervention(s): Monitored during session    Home Living  Prior Function            PT Goals (current goals can now be found in the care plan section) Acute Rehab PT Goals Patient Stated Goal: Less nausea PT Goal Formulation: With patient Time For Goal Achievement: 11/04/18 Potential to Achieve Goals: Fair Progress towards PT goals: Progressing toward goals    Frequency    Min 3X/week      PT Plan Current plan remains appropriate    Co-evaluation PT/OT/SLP Co-Evaluation/Treatment: Yes Reason for Co-Treatment: Other (comment)(Pt with low BP awaiting blood transfusion)          AM-PAC PT "6 Clicks" Mobility   Outcome Measure  Help needed turning from your back to your side while in a flat bed without using  bedrails?: A Lot Help needed moving from lying on your back to sitting on the side of a flat bed without using bedrails?: A Lot Help needed moving to and from a bed to a chair (including a wheelchair)?: Total Help needed standing up from a chair using your arms (e.g., wheelchair or bedside chair)?: Total Help needed to walk in hospital room?: Total Help needed climbing 3-5 steps with a railing? : Total 6 Click Score: 8    End of Session Equipment Utilized During Treatment: Gait belt;Oxygen Activity Tolerance: Patient tolerated treatment well Patient left: in bed;with call bell/phone within reach;with nursing/sitter in room Nurse Communication: Mobility status PT Visit Diagnosis: Muscle weakness (generalized) (M62.81);Difficulty in walking, not elsewhere classified (R26.2);Pain Pain - part of body: (Abdomen)     Time: 1130-1200 PT Time Calculation (min) (ACUTE ONLY): 30 min  Charges:  $Therapeutic Exercise: 8-22 mins $Therapeutic Activity: 8-22 mins                    Ina Homes, PT, DPT Acute Rehabilitation Services  Pager [phone] Office [phone]  Malachy Chamber 10/29/2018, 12:45 PM

## 2018-10-29 NOTE — Progress Notes (Signed)
[facility] Progress Note  Aaron [name] 53 y.o. November 02, 1965  CC: Ileus   Subjective: NG tube has been clamped since yesterday.  Tolerating it well.  Denied further nausea vomiting.  Currently on clear liquid diet.  Last bowel movement this morning.  Denies any blood in the stool or black stool.  Also discussed with nursing staff, no acute issues reported.  \ROS: Negative for fever and chills.  Negative for acute chest pain   Objective: Vital signs in last 24 hours: Vitals:   10/29/18 0604 10/29/18 0811  BP:  (!) 101/58  Pulse:  81  Resp:  13  Temp: 98.2 F (36.8 C) 97.6 F (36.4 C)  SpO2:  94%    Physical Exam:  General.  Resting comfortably in the bed.  No acute distress. ABD :   Soft.  Hypoactive bowel sounds, right upper quadrant discomfort on palpation bowel sounds present.  Abdomen remains mildly distended. Neuro.  Alert/oriented x3.  Not in acute distress Psych :   Mood and affect normal   Lab Results: Recent Labs    10/28/18 0433 10/29/18 0339  NA 133* 131*  K 3.9 3.4*  CL 100 101  CO2 25 27  GLUCOSE 154* 126*  BUN 16 20  CREATININE 0.94 0.90  CALCIUM 7.9* 7.5*  MG 1.9  --    No results for input(s): AST, ALT, ALKPHOS, BILITOT, PROT, ALBUMIN in the last 72 hours. Recent Labs    10/28/18 0433 10/29/18 0339  WBC 13.7* 13.0*  NEUTROABS  --  11.1*  HGB 7.7* 7.1*  HCT 24.5* 23.7*  MCV 92.5 92.9  PLT 202 213   No results for input(s): LABPROT, INR in the last 72 hours.    Assessment/Plan: -Ileus/ Chronic .  Slowly improving.  Last bowel movement today  -Anemia.  EGD 3/ 22 showed  LA grade C Esophagitis and gastritis.  Hemoglobin stable.  No active bleeding. -History of coronary artery disease. -Atrial  Fibrillation and right thigh hematoma.  Recommendations -------------------------- -Tolerating NG tube clamping since yesterday.  Offered to remove NG tube today but he declined.  He may consider it tomorrow.  -Mild drop in hemoglobin  could be dilutional.  He does not have any overt bleeding.  He is on IV twice daily PPI.  -Repeat x-ray abdomen in the morning.  -Continue MiraLAX to twice a day and Daily fleets enema.   -Okay to start anticoagulation from GI standpoint.  So far hemoglobin stable on aspirin and prophylactic/DVT dose Lovenox.  -Monitor H&H.  GI will follow   Kathi Der MD, FACP 10/29/2018, 8:57 AM  Contact #  [phone]

## 2018-10-30 ENCOUNTER — Inpatient Hospital Stay (HOSPITAL_COMMUNITY): Payer: Medicaid Other

## 2018-10-30 DIAGNOSIS — D62 Acute posthemorrhagic anemia: Secondary | ICD-10-CM

## 2018-10-30 DIAGNOSIS — K208 Other esophagitis without bleeding: Secondary | ICD-10-CM

## 2018-10-30 DIAGNOSIS — Z4659 Encounter for fitting and adjustment of other gastrointestinal appliance and device: Secondary | ICD-10-CM

## 2018-10-30 DIAGNOSIS — K2971 Gastritis, unspecified, with bleeding: Secondary | ICD-10-CM

## 2018-10-30 DIAGNOSIS — S7011XA Contusion of right thigh, initial encounter: Secondary | ICD-10-CM

## 2018-10-30 DIAGNOSIS — Z720 Tobacco use: Secondary | ICD-10-CM

## 2018-10-30 DIAGNOSIS — K2901 Acute gastritis with bleeding: Secondary | ICD-10-CM

## 2018-10-30 LAB — TYPE AND SCREEN
ABO/RH(D): A POS
Antibody Screen: NEGATIVE
Unit division: 0

## 2018-10-30 LAB — BPAM RBC
Blood Product Expiration Date: 202004172359
ISSUE DATE / TIME: 202004021458
Unit Type and Rh: 6200

## 2018-10-30 MED ORDER — LOSARTAN POTASSIUM 25 MG PO TABS: 25.0000 mg | ORAL_TABLET | Freq: Every day | ORAL | Status: DC

## 2018-10-30 MED ORDER — SPIRONOLACTONE 25 MG PO TABS: 25.0000 mg | ORAL_TABLET | Freq: Every day | ORAL | Status: DC

## 2018-10-30 MED ORDER — CARVEDILOL 3.125 MG PO TABS: 3.1250 mg | ORAL_TABLET | Freq: Two times a day (BID) | ORAL | Status: DC

## 2018-10-30 MED ORDER — APIXABAN 5 MG PO TABS: 5.0000 mg | ORAL_TABLET | Freq: Two times a day (BID) | ORAL | 0 refills | Status: DC

## 2018-10-30 MED ORDER — NICOTINE 14 MG/24HR TD PT24: 14.0000 mg | MEDICATED_PATCH | Freq: Every day | TRANSDERMAL | 0 refills | Status: DC

## 2018-10-30 MED ORDER — AMIODARONE HCL IN DEXTROSE 360-4.14 MG/200ML-% IV SOLN
INTRAVENOUS | Status: AC
  Filled 2018-10-30: qty 200

## 2018-10-30 MED ORDER — PANTOPRAZOLE SODIUM 40 MG PO TBEC: 40.0000 mg | DELAYED_RELEASE_TABLET | Freq: Two times a day (BID) | ORAL | Status: DC

## 2018-10-30 MED ORDER — ATORVASTATIN CALCIUM 80 MG PO TABS: 80.0000 mg | ORAL_TABLET | Freq: Every day | ORAL | 11 refills | Status: DC

## 2018-10-30 MED ORDER — DIGOXIN 125 MCG PO TABS: 0.1250 mg | ORAL_TABLET | Freq: Every day | ORAL | Status: DC

## 2018-10-30 MED ORDER — POLYETHYLENE GLYCOL 3350 17 G PO PACK: 17.0000 g | PACK | Freq: Two times a day (BID) | ORAL | 0 refills | Status: DC

## 2018-10-30 MED ORDER — AMIODARONE HCL 200 MG PO TABS: 200.0000 mg | ORAL_TABLET | Freq: Two times a day (BID) | ORAL | Status: DC

## 2018-10-30 MED ORDER — AMIODARONE HCL 200 MG PO TABS
200.0000 mg | ORAL_TABLET | Freq: Two times a day (BID) | ORAL | Status: DC
  Administered 2018-10-30 – 2018-11-05 (×13): 200 mg via ORAL
  Filled 2018-10-30 (×13): qty 1

## 2018-10-30 MED ORDER — ACETAMINOPHEN 325 MG PO TABS: 650.0000 mg | ORAL_TABLET | ORAL | Status: DC | PRN

## 2018-10-30 MED ORDER — APIXABAN 5 MG PO TABS
5.0000 mg | ORAL_TABLET | Freq: Two times a day (BID) | ORAL | Status: DC
  Administered 2018-10-30 – 2018-11-05 (×13): 5 mg via ORAL
  Filled 2018-10-30 (×13): qty 1

## 2018-10-30 MED ORDER — ALUM & MAG HYDROXIDE-SIMETH 200-200-20 MG/5ML PO SUSP: 30.0000 mL | ORAL | 0 refills | Status: DC | PRN

## 2018-10-30 NOTE — TOC Progression Note (Signed)
Transition of Care [facility]) - Progression Note    Patient Details  Name: Aaron [name] MRN: [id number] Date of Birth: 1966/05/01  [facility] CM/SW Contact  Estanislado Emms, LCSW Phone Number: 10/30/2018, 3:59 PM  Clinical Narrative:  CSW and CM met with patient at bedside to discuss disposition planning. Explained that if patient continues to be unable to tolerate therapy and unable to do three hours or therapy per day for CIR, other options will be SNF or home health. Patient stated he would not decline SNF. He was not sure about the status of his Medicaid application. He requested CSW and CM speak to his wife about insurance and SNF.   CSW left voicemail for Mrs. Defelice at 1:50 pm and requested return call. Have not yet received call. Will continue to follow and support with disposition planning.      Expected Discharge Plan: [place] [facility] Barriers to Discharge: Other (comment)(CIR looking at patient- wants to see if patient has tolerance for CIR)  Expected Discharge Plan and Services Expected Discharge Plan: [place] In-house Referral: Clinical Social Work Discharge Planning Services: CM Consult Post Acute Care Choice: [place] arrangements for the past 2 months: Single Family Home Expected Discharge Date: 10/30/18                         Social Determinants of Health (SDOH) Interventions    Readmission Risk Interventions No flowsheet data found.

## 2018-10-30 NOTE — Progress Notes (Signed)
I have been informed by CIR that they will not be able to take the patient until he has the ability to tolerate 3 hrs of therapy.  PT to continue working with him aggressively.

## 2018-10-30 NOTE — Progress Notes (Signed)
Inpatient Rehab Admissions Coordinator:   I met with patient at bedside.  Note he is still hypotensive with tilt bed in therapy.  Pt will need to demonstrate tolerance for out of bed activity prior to consideration for inpatient rehab admission.  Spoke to [facility] and CSW and have contacted Dr. Wynelle Cleveland to discuss.  I await her call.    Shann Medal, PT, DPT Admissions Coordinator [phone] 10/30/18  11:21 AM

## 2018-10-30 NOTE — Progress Notes (Signed)
[facility] Progress Note  Aaron [name] 53 y.o. Dec 04, 1965  CC: Ileus   Subjective: Continues to do well despite of NG tube being clamped.  Denies nausea vomiting.  Last bowel movement yesterday.  Denies blood in the stool or black stool.  \ROS: Negative for fever and chills.  Negative for acute chest pain   Objective: Vital signs in last 24 hours: Vitals:   10/30/18 0035 10/30/18 0419  BP: 96/72 96/65  Pulse: 79 90  Resp: 20 18  Temp: 97.8 F (36.6 C) 97.6 F (36.4 C)  SpO2: 98% 95%    Physical Exam:  General.  Resting comfortably in the bed.  No acute distress. ABD :   Soft.  Hypoactive bowel sounds, right upper quadrant discomfort on palpation bowel sounds present.  Abdomen remains mildly distended. Neuro.  Alert/oriented x3.  Not in acute distress Psych :   Mood and affect normal   Lab Results: Recent Labs    10/28/18 0433 10/29/18 0339  NA 133* 131*  K 3.9 3.4*  CL 100 101  CO2 25 27  GLUCOSE 154* 126*  BUN 16 20  CREATININE 0.94 0.90  CALCIUM 7.9* 7.5*  MG 1.9  --    No results for input(s): AST, ALT, ALKPHOS, BILITOT, PROT, ALBUMIN in the last 72 hours. Recent Labs    10/28/18 0433 10/29/18 0339 10/29/18 2222  WBC 13.7* 13.0*  --   NEUTROABS  --  11.1*  --   HGB 7.7* 7.1* 8.0*  HCT 24.5* 23.7* 25.8*  MCV 92.5 92.9  --   PLT 202 213  --    No results for input(s): LABPROT, INR in the last 72 hours.    Assessment/Plan: -Ileus/ Chronic .  Slowly improving.  Last bowel movement today  -Anemia.  EGD 3/ 22 showed  LA grade C Esophagitis and gastritis.  Hemoglobin stable.  No active bleeding. -History of coronary artery disease. -Atrial  Fibrillation and right thigh hematoma.  Recommendations -------------------------- -Remove NG tube.  -Advance diet to full liquid and slowly advance as tolerated.  -Okay to start anticoagulation from GI standpoint.  So far hemoglobin stable on aspirin and prophylactic/DVT dose Lovenox.   - No  Further inpatient GI work-up planned.  GI will sign off.  Call us back if needed.  - F/up with Dr. Marca Ancona in 2 to 3 months.    Kathi Der MD, FACP 10/30/2018, 9:45 AM  Contact #  [phone]

## 2018-10-30 NOTE — Discharge Instructions (Signed)

## 2018-10-30 NOTE — Discharge Summary (Signed)
Physician Discharge Summary  Aaron [name] GYB:[id number] DOB: 08/11/1965 DOA: 10/01/2018  PCP: [facility] @ [place]  Admit date: 10/01/2018 Discharge date: 10/30/2018  Admitted From: home Disposition:  CIR   Recommendations for Outpatient Follow-up:  1. Continue daily weights to ensure he is not gaining fluid weight 2. Anticoagulation resumed today- follow for bleeding 3. Will need cardiology opinion in 1 wk to see when to wean Amiodarone  Discharge Condition:  stable   CODE STATUS:  Full code   Diet recommendation:  Heart healthy, fluid restrict to 1400 cc Consultations:  Cardiology  GI   CT surgery  Orthopedic surgery  Procedures:   LHCon 10/09/2018: Severe three vessel disease  EGD 3/22 for GI bleed: small esophageal ulcers, gastritis.  NG tube  Discharge Diagnoses:  Principal Problem:   Acute systolic CHF (congestive heart failure) (HCC) Active Problems:   Anasarca   AKI (acute kidney injury) (HCC)   Acute hypoxemic respiratory failure (HCC)   Morbid obesity (HCC)   Atrial fibrillation with rapid ventricular response (HCC)   Hypokalemia   Protein-calorie malnutrition, severe   Hematoma of right thigh   Acute blood loss anemia   Tobacco abuse   Gastritis with bleeding   Esophagitis, [place] grade C     Brief Summary: Aaron [name] 53 y.o.malewith significant past medical history other than obesity who previously worked as a Radiation protection practitioner presented to [facility] on March 5 with anasarca and 100 pound weight gain.  In the ED patient was noted to be tachycardic, labs revealed AKI with BUN of 53, creatinine 1.4, his LFTs were slightly elevated in 200s and BNP was elevated at 2769. CT abdomen/pelvis showed diffuse anasarca, ascites and small bilateral pleural effusions.  Patient was admitted for further work-up of anasarca. Echocardiogram revealed EF of 15% with diffuse hypokinesis with left atrial moderately dilated, moderate MR and  mild AR. Patient was managed with Coreg and Lasix IV/milrinone drip/Spironolactone.   Hospital course was complicated by A. fib with RVR requiring heparin drip.  Patient subsequently underwent cardiac cath on March 13 which revealed severe three-vessel CAD (Moderate mid LAD disease, tight distal lesion beyond reach of IMA. Severe disease in circumflex. RCA totally occluded).PCI of LAD/LCx versus CABG was considered but cardiothoracic surgery evaluated patient and did not feel he is candidate for CABG, recommended aggressive medical therapy.   On March 18 patient developed pain in his right thigh and noticed a hard swelling for which CT lower extremity was obtained revealing a hematoma.   Hospital course also complicated by upper GI bleed around the same time prompting GI evaluation- EGD on 3/22revealing small esophageal ulcers, gastritis, esophagitis  Subsequently found to have an adynamic ileus/bowel obstruction. NG tube placed. Patient transferred back to [facility] due to ongoing multiple medical conditions.    Hospital Course:  Principal Problem:   Acute systolic CHF (congestive heart failure) /   Anasarca - EF 10-15 % - has resolved with aggressive diuresis - currently on daily Aldactone 25 mg, Losartan and Coreg   Active Problems:  CAD- triple vessel -LHC on 10/09/2018:  Moderate mid LAD disease, tight distal lesion beyond reach of IMA. Severe disease in circumflex. RCA totally occluded - PCI of LAD/LCx versus CABG was considered but (Dr Dorris Fetch) cardiothoracic surgery evaluated patient and did not feel he is candidate for CABG, recommended aggressive medical therapy.  - on baby aspirin and Lipitor  Right thigh hematoma - has stabilized  - he needs PT as he is not  able to lift up the right leg  Ileus - has resolved NG has been clamped for 2 days and he has been tolerating liquids - NG removed today- advanced to full liquids- can be advanced to heart healthy diet  tomorrow  Upper GI bleed - EGD 3/22 > gastritis and [place] grade C esophagitis- GI states it is safe to resume his anticoagulation - f/u with Dr Marca Ancona in 2-3 months please  Acute blood loss anemia - seems to have stopped bleeding- - Hb has drifted down to 7 today on 4/2 and was given 1 U PRBC with a dose of Lasix  Hypokalemia - replaced    A-fib/ NSVT - cannot anticoagulate for now due to above GI bleed and hematoma- as of today, GI feels comfortable with resuming anticoagulation- hematoma has been stable as well- I have resumed Eliquis - cont Amiodarone which I have switched from IV to oral today 200 BID- this will need to be weaned  In 1-2 wks and cardiology will need to be called by CIR to do this - cont Dig  Morbid obesity Body mass index is 36.03 kg/m. - needs to lose weight   Tobacco abuse - weaned nicotine patch from 21 to 14 mg on 4/2- can wean again to 7 mg in 1 wk   Discharge Exam: Vitals:   10/30/18 0419 10/30/18 0945  BP: 96/65 97/68  Pulse: 90 88  Resp: 18   Temp: 97.6 F (36.4 C)   SpO2: 95%    Vitals:   10/29/18 1944 10/30/18 0035 10/30/18 0419 10/30/18 0945  BP: (!) 99/59 96/72 96/65  97/68  Pulse: 94 79 90 88  Resp: (!) 22 20 18    Temp: 97.8 F (36.6 C) 97.8 F (36.6 C) 97.6 F (36.4 C)   TempSrc: Oral Oral Oral   SpO2: 95% 98% 95%   Weight:   (!) 137.2 kg   Height:        General: Pt is alert, awake, not in acute distress Cardiovascular: IIRR, S1/S2 +  Respiratory: CTA bilaterally, no wheezing, no rhonchi Abdominal: Soft, NT, ND, bowel sounds + Extremities:  , no cyanosis- right thigh swollen with pitting edema   Discharge Instructions  Discharge Instructions    Diet - low sodium heart healthy   Complete by:  As directed    Diet - low sodium heart healthy   Complete by:  As directed    Increase activity slowly   Complete by:  As directed    Increase activity slowly   Complete by:  As directed      Allergies as of 10/30/2018   No  Known Allergies     Medication List    TAKE these medications   acetaminophen 325 MG tablet Commonly known as:  TYLENOL Take 2 tablets (650 mg total) by mouth every 4 (four) hours as needed for headache or mild pain.   alum & mag hydroxide-simeth 200-200-20 MG/5ML suspension Commonly known as:  MAALOX/MYLANTA Take 30 mLs by mouth every 4 (four) hours as needed for indigestion or heartburn.   amiodarone 200 MG tablet Commonly known as:  PACERONE Take 1 tablet (200 mg total) by mouth 2 (two) times daily.   atorvastatin 80 MG tablet Commonly known as:  Lipitor Take 1 tablet (80 mg total) by mouth daily.   carvedilol 3.125 MG tablet Commonly known as:  COREG Take 1 tablet (3.125 mg total) by mouth 2 (two) times daily with a meal.   digoxin 0.125 MG tablet Commonly  known as:  LANOXIN Take 1 tablet (0.125 mg total) by mouth daily. Start taking on:  October 31, 2018   losartan 25 MG tablet Commonly known as:  COZAAR Take 1 tablet (25 mg total) by mouth daily. Start taking on:  October 31, 2018   nicotine 14 mg/24hr patch Commonly known as:  NICODERM CQ - dosed in mg/24 hours Place 1 patch (14 mg total) onto the skin daily. Start taking on:  October 31, 2018   pantoprazole 40 MG tablet Commonly known as:  Protonix Take 1 tablet (40 mg total) by mouth 2 (two) times daily before a meal.   polyethylene glycol packet Commonly known as:  MIRALAX / GLYCOLAX Take 17 g by mouth 2 (two) times daily.   spironolactone 25 MG tablet Commonly known as:  ALDACTONE Take 1 tablet (25 mg total) by mouth daily. Start taking on:  October 31, 2018       No Known Allergies   Procedures/Studies:    Ct Abdomen Pelvis Wo Contrast  Result Date: 10/01/2018 CLINICAL DATA:  Side abdominal pain, BILATERAL leg swelling for 3 weeks, question jaundice, weakness, shortness of breath with exertion EXAM: CT ABDOMEN AND PELVIS WITHOUT CONTRAST TECHNIQUE: Multidetector CT imaging of the abdomen and pelvis was  performed following the standard protocol without IV contrast. Sagittal and coronal MPR images reconstructed from axial data set. Oral contrast was not administered. COMPARISON:  None FINDINGS: Lower chest: Small BILATERAL pleural effusions. Subsegmental atelectasis LEFT lower lobe. Airspace infiltrates RIGHT lower lobe question pneumonia. Hepatobiliary: Gallbladder and liver normal appearance. No definite hepatic mass or nodularity Pancreas: Normal appearance Spleen: Normal appearance Adrenals/Urinary Tract: Adrenal glands normal appearance. LEFT kidney normal appearance without mass or hydronephrosis. Low-attenuation focus mid RIGHT kidney question small cyst approximately 16 x 15 mm diameter image 45. Additional large exophytic cyst at inferior pole RIGHT kidney 9.1 x 9.4 x 7.4 cm. Bladder grossly unremarkable. Stomach/Bowel: Elongated sigmoid loop. Stomach and bowel loops otherwise normal appearance. Appendix not visualized. Vascular/Lymphatic: Atherosclerotic calcifications aorta and iliac arteries without aneurysm. Enlargement of cardiac chambers. No adenopathy. Reproductive: Mild prostatic enlargement gland 5.3 x 4.7 cm image 90. Other: Scattered subcutaneous and soft tissue edema throughout abdomen and pelvis into of inguinal regions. Mild presacral edema. Scattered fluid along the inferior aspects of the pararenal spaces in the upper pelvis. Minimal intraperitoneal fluid free fluid. No free air. No hernia. Musculoskeletal: Bones demineralized with scattered degenerative disc disease changes. IMPRESSION: Scattered soft tissue edema, minimal ascites and small BILATERAL pleural effusions, could be related to hypoproteinemia, heart failure, renal failure or anasarca. RIGHT lower lobe infiltrate consistent with pneumonia. Enlargement of cardiac chambers. Prostatic enlargement. Probable RIGHT renal cysts, small at mid kidney and 9.4 cm at inferior pole; these could be characterized by ultrasound. Electronically  Signed   By: Ulyses Southward M.D.   On: 10/01/2018 14:54   Dg Abd 1 View  Result Date: 10/27/2018 CLINICAL DATA:  Ileus.  Morbid obesity. EXAM: ABDOMEN - 1 VIEW COMPARISON:  10/26/2018 FINDINGS: Nasogastric tube is again seen with tip overlying the distal stomach. Diffuse dilatation of the colon shows no significant change, most likely due to chronic ileus. IMPRESSION: Probable colonic ileus, without significant change. Electronically Signed   By: Myles Rosenthal M.D.   On: 10/27/2018 19:19   Dg Abd 1 View  Result Date: 10/26/2018 CLINICAL DATA:  Ileus EXAM: ABDOMEN - 1 VIEW COMPARISON:  10/24/2018; 10/22/2018; 10/19/2018; CT abdomen pelvis-10/19/2018 FINDINGS: Enteric tube tip and side port overlies the expected  location of the gastric antrum. Grossly unchanged marked gaseous distention of the colon with index loop of colon within the left mid hemiabdomen measuring approximately 7.8 cm in diameter. No change to slight improvement of gaseous distention of several loops of small bowel within the left mid hemiabdomen with index loop measuring approximately 4.6 cm with potential bowel wall thickening. No pneumoperitoneum, pneumatosis or portal venous gas. No acute osseous abnormalities. IMPRESSION: Grossly unchanged findings most suggestive of ileus. Electronically Signed   By: Simonne Come M.D.   On: 10/26/2018 08:14   Dg Abd 1 View  Result Date: 10/24/2018 CLINICAL DATA:  Ileus. EXAM: ABDOMEN - 1 VIEW COMPARISON:  Radiographs of October 22, 2018. FINDINGS: Stable position of nasogastric tube with tip in expected position of distal stomach. Stable colonic dilatation is noted suggesting ileus. Minimal to mild small bowel dilatation is seen in the left side of the abdomen. IMPRESSION: Stable position of the nasogastric tube. Grossly stable colonic and small bowel dilatation is noted concerning for ileus. Electronically Signed   By: Lupita Raider, M.D.   On: 10/24/2018 08:29   Dg Abd 1 View  Result Date:  10/22/2018 CLINICAL DATA:  Small-bowel obstruction. EXAM: ABDOMEN - 1 VIEW COMPARISON:  10/19/2018; 10/17/2018; CT abdomen pelvis-10/19/2018 FINDINGS: Enteric tube tip and side port project over the expected location of the gastric antrum. Grossly unchanged moderate to marked gaseous distention of several loops of large and small bowel with index loop of small bowel within the right mid hemiabdomen measuring 6.4 cm in diameter, grossly unchanged. Nondiagnostic evaluation for pneumoperitoneum secondary to exclusion of the lower thorax. No definite pneumatosis or portal venous gas. No acute osseous abnormalities. IMPRESSION: Similar findings most suggestive of ileus. Electronically Signed   By: Simonne Come M.D.   On: 10/22/2018 08:05   Dg Abd 1 View  Result Date: 10/19/2018 CLINICAL DATA:  Vomiting, abdominal distention. EXAM: ABDOMEN - 1 VIEW COMPARISON:  Radiographs of October 17, 2018. FINDINGS: Stable small bowel dilatation is noted concerning for distal small bowel obstruction or possibly ileus. Possible right colonic dilatation is noted. Distal colon is nondilated. No radio-opaque calculi or other significant radiographic abnormality are seen. IMPRESSION: Small bowel dilatation is noted concerning for distal small bowel obstruction or possibly ileus. Continued radiographic follow-up is recommended. Possible right colonic dilatation is noted. Electronically Signed   By: Lupita Raider, M.D.   On: 10/19/2018 07:26   Dg Abd 1 View  Result Date: 10/17/2018 CLINICAL DATA:  Small bowel obstruction. EXAM: ABDOMEN - 1 VIEW COMPARISON:  10/16/2018 FINDINGS: Nasogastric tube in the upper abdomen and appears to be in the stomach body region. Again noted are dilated loops of small and large bowel. Bowel gas distension is similar to the previous examination. Limited evaluation for free air on these supine images. Stool in the rectum. IMPRESSION: Stable dilatation of small and large bowel loops. Differential diagnosis  includes an ileus versus colonic obstruction. Nasogastric tube appears to be in the stomach. Electronically Signed   By: Richarda Overlie M.D.   On: 10/17/2018 09:19   Dg Abd 1 View  Result Date: 10/16/2018 CLINICAL DATA:  Nasogastric tube placement EXAM: ABDOMEN - 1 VIEW COMPARISON:  October 16, 2018 abdominal radiographs obtained earlier in the day FINDINGS: Nasogastric tube tip is at the gastroesophageal junction. There remains dilated bowel. No free air evident. IMPRESSION: Nasogastric tube tip at gastroesophageal junction. Advise advancing nasogastric tube approximately 8 cm to insure that tip and side port are well within  the stomach. Persistent bowel dilatation noted. These results will be called to the ordering clinician or representative by the Radiology Department at the imaging location. Electronically Signed   By: Bretta BangWilliam  Woodruff III M.D.   On: 10/16/2018 13:34   Dg Abd 1 View  Result Date: 10/16/2018 CLINICAL DATA:  Hematemesis.  Distention. EXAM: ABDOMEN - 1 VIEW COMPARISON:  10/10/2018. FINDINGS: Prominently distended loops of small and large bowel are noted. Stool noted in the colon including the rectum. Findings most consistent adynamic ileus. Small or large bowel obstruction can not be excluded. Close follow-up exams suggested to demonstrate resolution. Pelvic calcifications consistent phleboliths. Degenerative change lumbar spine. IMPRESSION: Probably distended loops of small and large bowel noted. Stool noted in the colon including the rectum. Findings most consistent with adynamic ileus. Small or large bowel obstruction can not be excluded. Close follow-up exam suggested demonstrate resolution. Electronically Signed   By: Maisie Fushomas  Register   On: 10/16/2018 08:58   Ct Abdomen Pelvis W Contrast  Result Date: 10/19/2018 CLINICAL DATA:  Small bowel obstruction. EXAM: CT ABDOMEN AND PELVIS WITH CONTRAST TECHNIQUE: Multidetector CT imaging of the abdomen and pelvis was performed using the  standard protocol following bolus administration of intravenous contrast. CONTRAST:  100mL OMNIPAQUE IOHEXOL 300 MG/ML  SOLN COMPARISON:  CT scan of October 01, 2018. FINDINGS: Lower chest: Moderate right pleural effusion is noted with adjacent right lower lobe atelectasis. Hepatobiliary: No focal liver abnormality is seen. No gallstones, gallbladder wall thickening, or biliary dilatation. Pancreas: Unremarkable. No pancreatic ductal dilatation or surrounding inflammatory changes. Spleen: Normal in size without focal abnormality. Adrenals/Urinary Tract: Adrenal glands appear normal. Large right renal cyst is noted. No hydronephrosis or renal obstruction is noted. No renal or ureteral calculi are noted. Urinary bladder is unremarkable. Stomach/Bowel: Stomach appears normal. Proximal small bowel dilatation is noted concerning for distal small bowel obstruction or possibly ileus. No definite transition zone is noted. The appendix is not visualized, although no inflammation is noted in the right lower quadrant. Mild wall thickening of the rectum is noted suggesting possible inflammation. Vascular/Lymphatic: Aortic atherosclerosis. No enlarged abdominal or pelvic lymph nodes. Reproductive: Prostate is unremarkable. Other: No abdominal wall hernia or abnormality. No abdominopelvic ascites. Musculoskeletal: No acute or significant osseous findings. IMPRESSION: Proximal small bowel dilatation is noted concerning for distal small bowel obstruction or possibly ileus. No definite focal transition zone is noted. Mild wall thickening of the rectum is noted suggesting possible inflammation. Moderate right pleural effusion with adjacent right lower lobe subsegmental atelectasis. Aortic Atherosclerosis (ICD10-I70.0). Electronically Signed   By: Lupita RaiderJames  Green Jr, M.D.   On: 10/19/2018 13:24   Ct Tibia Fibula Right Wo Contrast  Result Date: 10/15/2018 CLINICAL DATA:  Burning, cramping, numbness on right upper leg. EXAM: CT OF THE  RIGHT FEMUR WITHOUT CONTRAST CT OF THE RIGHT TIBIA AND FIBULA WITHOUT CONTRAST CT OF THE RIGHT FOOT WITHOUT CONTRAST TECHNIQUE: Multidetector CT imaging of the right lower extremity was performed according to the standard protocol. COMPARISON:  None. FINDINGS: Bones/Joint/Cartilage FEMUR: No fracture or dislocation. Normal alignment. No joint effusion. No aggressive osseous lesion. TIBIA AND FIBULA: No fracture or dislocation. Normal alignment. No joint effusion. No aggressive osseous lesion. FOOT: No fracture or dislocation. Normal alignment. No joint effusion. No aggressive osseous lesion. Ligaments Ligaments are suboptimally evaluated by CT. Muscles and Tendons Expansion of the right rectus femoris muscle with an intramuscular hematoma measuring 6.7 x 5.7 x 2.5 cm. Remainder the muscles are normal. No other intramuscular fluid collection or  hematoma. No muscle atrophy. Quadriceps tendon and patellar tendon are intact. Flexor, extensor, peroneal and Achilles tendons are intact. Soft tissue No fluid collection or hematoma. No soft tissue mass. Generalized soft tissue edema around the right foot likely reactive. Mild soft tissue edema around the right knee. IMPRESSION: 1. Large intramuscular hematoma in the right rectus femoris muscle. Electronically Signed   By: Elige Ko   On: 10/15/2018 16:57   Ct Foot Right Wo Contrast  Result Date: 10/15/2018 CLINICAL DATA:  Burning, cramping, numbness on right upper leg. EXAM: CT OF THE RIGHT FEMUR WITHOUT CONTRAST CT OF THE RIGHT TIBIA AND FIBULA WITHOUT CONTRAST CT OF THE RIGHT FOOT WITHOUT CONTRAST TECHNIQUE: Multidetector CT imaging of the right lower extremity was performed according to the standard protocol. COMPARISON:  None. FINDINGS: Bones/Joint/Cartilage FEMUR: No fracture or dislocation. Normal alignment. No joint effusion. No aggressive osseous lesion. TIBIA AND FIBULA: No fracture or dislocation. Normal alignment. No joint effusion. No aggressive osseous  lesion. FOOT: No fracture or dislocation. Normal alignment. No joint effusion. No aggressive osseous lesion. Ligaments Ligaments are suboptimally evaluated by CT. Muscles and Tendons Expansion of the right rectus femoris muscle with an intramuscular hematoma measuring 6.7 x 5.7 x 2.5 cm. Remainder the muscles are normal. No other intramuscular fluid collection or hematoma. No muscle atrophy. Quadriceps tendon and patellar tendon are intact. Flexor, extensor, peroneal and Achilles tendons are intact. Soft tissue No fluid collection or hematoma. No soft tissue mass. Generalized soft tissue edema around the right foot likely reactive. Mild soft tissue edema around the right knee. IMPRESSION: 1. Large intramuscular hematoma in the right rectus femoris muscle. Electronically Signed   By: Elige Ko   On: 10/15/2018 16:57   Dg Chest Port 1 View  Result Date: 10/01/2018 CLINICAL DATA:  Patient c/o bilateral leg swelling worsening x3 weeks. Also c/o left side pain. Patient appears jaundiced. Reports weakness and SOB on exertion. Non smoker, tachypnea. EXAM: PORTABLE CHEST 1 VIEW COMPARISON:  None. FINDINGS: Cardiac silhouette is mildly enlarged. No mediastinal or hilar masses or convincing adenopathy. There is left greater than right lung base opacity consistent with atelectasis or infection. Remainder of the lungs is clear with no evidence of pulmonary edema. No visualized pleural effusion. No pneumothorax. Skeletal structures are grossly intact. IMPRESSION: 1. Left greater than right lung base opacity consistent with atelectasis, pneumonia or a combination. 2. No evidence of pulmonary edema. 3. Mild cardiomegaly. Electronically Signed   By: Amie Portland M.D.   On: 10/01/2018 13:41   Dg Abd Portable 1v  Result Date: 10/30/2018 CLINICAL DATA:  Ileus, NG tube EXAM: PORTABLE ABDOMEN - 1 VIEW COMPARISON:  10/27/2018 FINDINGS: NG tube remains in stable position. Gaseous distention of bowel again noted, unchanged. This  likely reflects ileus as large and small bowel are involved. No free air or organomegaly. IMPRESSION: Continued diffuse gaseous distention of bowel, likely ileus. No change. Electronically Signed   By: Charlett Nose M.D.   On: 10/30/2018 07:37   Dg Abd Portable 1v  Result Date: 10/19/2018 CLINICAL DATA:  Nasogastric tube repositioning EXAM: PORTABLE ABDOMEN - 1 VIEW COMPARISON:  October 19, 2018 study obtained earlier in the day FINDINGS: Nasogastric tube tip and side port in stomach. There are loops of dilated bowel consistent with either ileus or a degree of obstruction. No free air. There is consolidation in the left base. IMPRESSION: Nasogastric tube tip and side port in stomach. Loops of dilated bowel persist. No free air. Consolidation left lung base.  Electronically Signed   By: Bretta Bang III M.D.   On: 10/19/2018 19:59   Dg Abd Portable 1v  Result Date: 10/19/2018 CLINICAL DATA:  Repositioning of nasogastric tube EXAM: PORTABLE ABDOMEN - 1 VIEW COMPARISON:  Portable exam 1644 hours compared to 1526 hours FINDINGS: Nasogastric tube coiled in distal esophagus. Air-filled distended loops of large and small bowel in the upper abdomen. Opacified LEFT lung base. Bones demineralized. IMPRESSION: Nasogastric tube remains coiled at the distal esophagus. Electronically Signed   By: Ulyses Southward M.D.   On: 10/19/2018 17:14   Dg Abd Portable 1v  Result Date: 10/19/2018 CLINICAL DATA:  Check gastric catheter placement EXAM: PORTABLE ABDOMEN - 1 VIEW COMPARISON:  None. FINDINGS: Scattered large and small bowel gas is noted. Gastric catheter is noted coiled upon itself in the distal esophagus. It does not appear to enter the stomach. This should be withdrawn slightly and readvanced. IMPRESSION: Gastric catheter appears coiled upon itself within the distal esophagus. Electronically Signed   By: Alcide Clever M.D.   On: 10/19/2018 15:46   Dg Abd Portable 1v  Result Date: 10/10/2018 CLINICAL DATA:  Abdominal  pain EXAM: PORTABLE ABDOMEN - 1 VIEW COMPARISON:  CT 10/01/2018 FINDINGS: Bowel gas pattern is unremarkable. No calcification or bone findings. IMPRESSION: Negative. Electronically Signed   By: Paulina Fusi M.D.   On: 10/10/2018 10:48   US Liver Doppler  Result Date: 10/01/2018 CLINICAL DATA:  Elevated liver function test. EXAM: DUPLEX ULTRASOUND OF LIVER TECHNIQUE: Color and duplex Doppler ultrasound was performed to evaluate the hepatic in-flow and out-flow vessels. COMPARISON:  None. FINDINGS: Liver: Mild generalized increased liver parenchymal echogenicity. Normal hepatic contour without nodularity. No focal lesion, mass or intrahepatic biliary ductal dilatation. Main Portal Vein size: 1.4 cm cm Portal Vein Velocities: All hepatopetal Main Prox:  9.2 cm/sec Main Mid: Not well visualized Main Dist:  9.1 cm/sec Right: 5.0 cm/sec Left: 5.8 cm/sec Hepatic Vein Velocities: All hepatofugal Right:  39 cm/sec Middle:  18.8 cm/sec Left:  19.7 cm/sec IVC: Present and patent with normal respiratory phasicity. Hepatic Artery Velocity:  55.5 cm/sec Splenic Vein Velocity:  Not visualized. Spleen: 6.2 cm x 9.4 cm x 3.5 cm with a total volume of 106 cm^3 (411 cm^3 is upper limit normal) Portal Vein Occlusion/Thrombus: No evidence of portal vein occlusion. No visualized thrombus Splenic Vein Occlusion/Thrombus: Not well visualized. Ascites: None Varices: None Study somewhat limited by the patient's body habitus and rapid respirations. IMPRESSION: 1. Overall diminished flow in the portal vein with low velocities, but normal directional flow. 2. Splenic vein not visualized.  Spleen normal in size. 3. Patent hepatic veins with normal directional flow. Electronically Signed   By: Amie Portland M.D.   On: 10/01/2018 20:44   Ct Extremity Lower Right Wo Contrast  Result Date: 10/15/2018 CLINICAL DATA:  Burning, cramping, numbness on right upper leg. EXAM: CT OF THE RIGHT FEMUR WITHOUT CONTRAST CT OF THE RIGHT TIBIA AND FIBULA  WITHOUT CONTRAST CT OF THE RIGHT FOOT WITHOUT CONTRAST TECHNIQUE: Multidetector CT imaging of the right lower extremity was performed according to the standard protocol. COMPARISON:  None. FINDINGS: Bones/Joint/Cartilage FEMUR: No fracture or dislocation. Normal alignment. No joint effusion. No aggressive osseous lesion. TIBIA AND FIBULA: No fracture or dislocation. Normal alignment. No joint effusion. No aggressive osseous lesion. FOOT: No fracture or dislocation. Normal alignment. No joint effusion. No aggressive osseous lesion. Ligaments Ligaments are suboptimally evaluated by CT. Muscles and Tendons Expansion of the right rectus femoris muscle with  an intramuscular hematoma measuring 6.7 x 5.7 x 2.5 cm. Remainder the muscles are normal. No other intramuscular fluid collection or hematoma. No muscle atrophy. Quadriceps tendon and patellar tendon are intact. Flexor, extensor, peroneal and Achilles tendons are intact. Soft tissue No fluid collection or hematoma. No soft tissue mass. Generalized soft tissue edema around the right foot likely reactive. Mild soft tissue edema around the right knee. IMPRESSION: 1. Large intramuscular hematoma in the right rectus femoris muscle. Electronically Signed   By: Elige Ko   On: 10/15/2018 16:57   [place] Ekg Site Rite  Result Date: 10/09/2018 If Site Rite image not attached, placement could not be confirmed due to current cardiac rhythm.  US Abdomen Limited Ruq  Result Date: 10/01/2018 CLINICAL DATA:  Elevated liver function test. EXAM: ULTRASOUND ABDOMEN LIMITED RIGHT UPPER QUADRANT COMPARISON:  None. FINDINGS: Gallbladder: Dependent mobile gallstones. No wall thickening or pericholecystic fluid. Common bile duct: Diameter: 5-6 mm Liver: No focal lesion identified. Mild generalized increased parenchymal echogenicity. Portal vein is patent on color Doppler imaging with normal direction of blood flow towards the liver. Bilateral pleural effusions. IMPRESSION: 1. No acute  findings. 2. Cholelithiasis without acute cholecystitis. No bile duct dilation. 3. Increased echogenicity of the liver suggests fatty infiltration. Electronically Signed   By: Amie Portland M.D.   On: 10/01/2018 20:39     The results of significant diagnostics from this hospitalization (including imaging, microbiology, ancillary and laboratory) are listed below for reference.     Microbiology: No results found for this or any previous visit (from the past 240 hour(s)).   Labs: BNP (last 3 results) Recent Labs    10/01/18 1337 10/01/18 1720  BNP 2,769.2* 2,333.1*   Basic Metabolic Panel: Recent Labs  Lab 10/25/18 0509 10/26/18 0405 10/27/18 0550 10/28/18 0433 10/29/18 0339  NA 134* 132* 132* 133* 131*  K 3.2* 3.3* 3.4* 3.9 3.4*  CL 103 102 100 100 101  CO2 25 23 23 25 27   GLUCOSE 105* 141* 166* 154* 126*  BUN 18 16 15 16 20   CREATININE 0.94 0.82 0.77 0.94 0.90  CALCIUM 7.7* 7.5* 7.6* 7.9* 7.5*  MG 2.0  --   --  1.9  --   PHOS 2.7  --   --   --   --    Liver Function Tests: No results for input(s): AST, ALT, ALKPHOS, BILITOT, PROT, ALBUMIN in the last 168 hours. No results for input(s): LIPASE, AMYLASE in the last 168 hours. No results for input(s): AMMONIA in the last 168 hours. CBC: Recent Labs  Lab 10/25/18 0509 10/26/18 0405 10/27/18 0550 10/28/18 0433 10/29/18 0339 10/29/18 2222  WBC 14.5* 14.1* 13.3* 13.7* 13.0*  --   NEUTROABS  --   --   --   --  11.1*  --   HGB 7.9* 7.8* 7.7* 7.7* 7.1* 8.0*  HCT 24.5* 25.3* 24.2* 24.5* 23.7* 25.8*  MCV 89.7 90.7 91.3 92.5 92.9  --   PLT 260 230 216 202 213  --    Cardiac Enzymes: No results for input(s): CKTOTAL, CKMB, CKMBINDEX, TROPONINI in the last 168 hours. BNP: Invalid input(s): POCBNP CBG: No results for input(s): GLUCAP in the last 168 hours. D-Dimer No results for input(s): DDIMER in the last 72 hours. Hgb A1c No results for input(s): HGBA1C in the last 72 hours. Lipid Profile No results for input(s):  CHOL, HDL, LDLCALC, TRIG, CHOLHDL, LDLDIRECT in the last 72 hours. Thyroid function studies No results for input(s): TSH, T4TOTAL, T3FREE,  THYROIDAB in the last 72 hours.  Invalid input(s): FREET3 Anemia work up No results for input(s): VITAMINB12, FOLATE, FERRITIN, TIBC, IRON, RETICCTPCT in the last 72 hours. Urinalysis    Component Value Date/Time   COLORURINE AMBER (A) 10/01/2018 1523   APPEARANCEUR HAZY (A) 10/01/2018 1523   LABSPEC 1.023 10/01/2018 1523   PHURINE 5.0 10/01/2018 1523   GLUCOSEU NEGATIVE 10/01/2018 1523   HGBUR SMALL (A) 10/01/2018 1523   BILIRUBINUR NEGATIVE 10/01/2018 1523   KETONESUR NEGATIVE 10/01/2018 1523   PROTEINUR NEGATIVE 10/01/2018 1523   NITRITE NEGATIVE 10/01/2018 1523   LEUKOCYTESUR NEGATIVE 10/01/2018 1523   Sepsis Labs Invalid input(s): PROCALCITONIN,  WBC,  LACTICIDVEN Microbiology No results found for this or any previous visit (from the past 240 hour(s)).   Time coordinating discharge in minutes: 65  SIGNED:   Calvert Cantor, MD  [facility] 10/30/2018, 11:14 AM Pager   If 7PM-7AM, please contact night-coverage [email or web address] Password [email or web address]

## 2018-10-30 NOTE — TOC Progression Note (Signed)
Transition of Care [facility]) - Progression Note    Patient Details  Name: Aaron [name] MRN: [id number] Date of Birth: May 17, 1966  Transition of Care [facility]) CM/SW Contact  Graves-Bigelow, Lamar Laundry, RN Phone Number: 10/30/2018, 12:44 PM  Clinical Narrative:  NG tube removed. Inpatient Rehab is following the patient. Per rehab admissions coordinator, pt needs to show that he has the tolerance to do the 3 hour therapy. FL2 has been completed and placed in Epic. CM and CSW will speak with family to see if they are agreeable to facility. CM will continue to monitor for additional transition of care needs.       Expected Discharge Plan: Home w Home Health Services Barriers to Discharge: Other (comment)(CIR looking at patient- wants to see if patient has tolerance for CIR)  Expected Discharge Plan and Services Expected Discharge Plan: Home w Home Health Services In-house Referral: Clinical Social Work Discharge Planning Services: CM Consult Post Acute Care Choice: Home Health Living arrangements for the past 2 months: Single Family Home Expected Discharge Date: 10/30/18                Social Determinants of Health (SDOH) Interventions  Readmission Risk Interventions No flowsheet data found.

## 2018-10-30 NOTE — Progress Notes (Signed)
Physical Therapy Treatment Patient Details Name: Aaron [name] MRN: [id number] DOB: [birth year] Today's Date: 10/30/2018    History of Present Illness Pt is a 53 y.o. male admitted 10/01/18 with progressive fluid retention, abdominal/BLE swelling. Worked up for anasarca and CHF with EF 15%. Transferred to [facility] for cardiac cath 3/13, found to have multivessel CAD; not a candidate for CABG. CT 3/19 with acute R thigh hematoma; planned PCI deferred secondary to this. Pt with positive gastric occult blood; KUB shows ileus, s/p NGT placement. PMH includes obesity.    PT Comments    Pt continues to make steady progress. Pt able to tolerate sitting EOB without any c/o's of lightheadedness. Attempted to stand but unable with 2 person assist. With Bledsoe brace locked in extension pt unable to get RLE underneath him to attempt to stand. Returned to supine and performed 1 short tilt for 5 minutes at 40 degrees with pt working on knee extension. Pt with c/o light headedness and BP 91/67. Pt may need to focus on lateral/scoot transfers while legs are so weak. Feel pt could tolerate 3 hrs of therapy required on CIR. Feel pt could benefit from being in room with maxisky to make transfer to recliner possible. Pt may be difficult to transfer with maximove due to breadth of shoulders, hips and with rt knee locked in extension by brace.  Pt continues to be very motivated to participate.    Follow Up Recommendations  CIR;Supervision/Assistance - 24 hour     Equipment Recommendations  Other (comment)(To be determined)    Recommendations for Other Services       Precautions / Restrictions Precautions Precautions: Fall Precaution Comments: Watch BP; VitalGo tilt bed Required Braces or Orthoses: Other Brace Other Brace: RLE Bledsoe brace locked in extension when up Restrictions Weight Bearing Restrictions: Yes RLE Weight Bearing: Weight bearing as tolerated    Mobility  Bed Mobility Overal bed mobility: Needs  Assistance Bed Mobility: Rolling;Supine to Sit;Sit to Supine Rolling: Min assist   Supine to sit: +2 for physical assistance;Min assist Sit to supine: Mod assist   General bed mobility comments: Assist to bring legs off of bed and elevate trunk into sitting. Assist to bring legs back up into bed when returning to supine.  Transfers Overall transfer level: Needs assistance               General transfer comment: Attempted to stand from bedside with +2 assist but pt unable. With LLE brace locked in extension pt unable to have RLE underneath him to attempt transfer   Ambulation/Gait                 Stairs             Wheelchair Mobility    Modified Rankin (Stroke Patients Only)       Balance Overall balance assessment: Needs assistance Sitting-balance support: No upper extremity supported;Feet supported Sitting balance-Leahy Scale: Fair Sitting balance - Comments: Pt sat EOB x 15 minutes                                    Cognition Arousal/Alertness: Awake/alert Behavior During Therapy: WFL for tasks assessed/performed Overall Cognitive Status: Within Functional Limits for tasks assessed  Exercises      General Comments General comments (skin integrity, edema, etc.): After returning to supine. Tilted pt in VitalGo tilt bed to 40 degrees fro 5 minutes. Pt c/o being light headed. BP 91/67      Pertinent Vitals/Pain Pain Assessment: Faces Faces Pain Scale: Hurts a little bit Pain Location: Abdomen Pain Descriptors / Indicators: Discomfort;Grimacing Pain Intervention(s): Monitored during session;Repositioned    Home Living                      Prior Function            PT Goals (current goals can now be found in the care plan section) Progress towards PT goals: Progressing toward goals    Frequency    Min 3X/week      PT Plan Current plan remains  appropriate    Co-evaluation              AM-PAC PT "6 Clicks" Mobility   Outcome Measure  Help needed turning from your back to your side while in a flat bed without using bedrails?: A Little Help needed moving from lying on your back to sitting on the side of a flat bed without using bedrails?: A Little Help needed moving to and from a bed to a chair (including a wheelchair)?: Total Help needed standing up from a chair using your arms (e.g., wheelchair or bedside chair)?: Total Help needed to walk in hospital room?: Total Help needed climbing 3-5 steps with a railing? : Total 6 Click Score: 10    End of Session Equipment Utilized During Treatment: Gait belt;Oxygen Activity Tolerance: Patient tolerated treatment well Patient left: in bed;with call bell/phone within reach Nurse Communication: Mobility status PT Visit Diagnosis: Muscle weakness (generalized) (M62.81);Difficulty in walking, not elsewhere classified (R26.2);Pain     Time: 8469-6295 PT Time Calculation (min) (ACUTE ONLY): 30 min  Charges:  $Therapeutic Activity: 23-37 mins                     [facility] PT Acute Rehabilitation Services Pager [phone] Office [phone]    Angelina Ok [facility] 10/30/2018, 3:29 PM

## 2018-10-30 NOTE — Progress Notes (Signed)
Pt continues to progress steadily. Sat EOB x 10 minutes for grooming and tolerated performing UB exercises with doubled level 1 theraband. Pt agreeable to focusing on UB exercised outside of therapy with goal of working toward standing.   10/30/18 1600  OT Visit Information  Last OT Received On 10/30/18  Assistance Needed +2  History of Present Illness Pt is a 53 y.o. male admitted 10/01/18 with progressive fluid retention, abdominal/BLE swelling. Worked up for anasarca and CHF with EF 15%. Transferred to [facility] for cardiac cath 3/13, found to have multivessel CAD; not a candidate for CABG. CT 3/19 with acute R thigh hematoma; planned PCI deferred secondary to this. Pt with positive gastric occult blood; KUB shows ileus, s/p NGT placement. PMH includes obesity.  Precautions  Precautions Fall  Precaution Comments Watch BP; VitalGo tilt bed  Required Braces or Orthoses Other Brace  Other Brace RLE Bledsoe brace locked in extension when up  Pain Assessment  Pain Assessment Faces  Faces Pain Scale 2  Pain Location Abdomen  Pain Descriptors / Indicators Discomfort;Grimacing  Pain Intervention(s) Monitored during session;Repositioned  Cognition  Arousal/Alertness Awake/alert  Behavior During Therapy WFL for tasks assessed/performed  Overall Cognitive Status Within Functional Limits for tasks assessed  ADL  Overall ADL's  Needs assistance/impaired  Grooming Wash/dry hands;Wash/dry face;Sitting;Supervision/safety;Set up;Oral care  General ADL Comments performed grooming at EOB x 10 minutes  Bed Mobility  Overal bed mobility Needs Assistance  Bed Mobility Supine to Sit;Sit to Supine  Supine to sit Mod assist  Sit to supine Mod assist  General bed mobility comments Assist to bring legs off of bed and elevate trunk into sitting. Assist to bring legs back up into bed when returning to supine.  Balance  Overall balance assessment Needs assistance  Sitting balance-Leahy Scale Fair  Sitting balance -  Comments Pt sat EOB x 10 minutes while engaged in ADL.  Restrictions  Weight Bearing Restrictions Yes  RLE Weight Bearing WBAT  Exercises  Exercises General Upper Extremity  General Exercises - Upper Extremity  Shoulder Flexion Strengthening;Both;Supine;10 reps;Theraband  Shoulder Extension Strengthening;5 reps;10 reps;Supine;Theraband  Shoulder Horizontal ABduction Both;10 reps;Supine;Theraband;Strengthening  Elbow Flexion Strengthening;Both;10 reps;Theraband;Supine  Elbow Extension Strengthening;Both;10 reps;Theraband;Supine  Theraband Level (Shoulder Horizontal Abduction) Level 1 (Yellow) (doubled)  Theraband Level (Elbow Flexion) Level 1 (Yellow) (doubled)  Theraband Level (Elbow Extension) Level 1 (Yellow) (doubled)  Theraband Level (Shoulder Flexion) Level 1 (Yellow) (doubled)  Theraband Level (Shoulder Extension) Level 1 (Yellow) (doubled)  OT - End of Session  Equipment Utilized During Treatment Oxygen (3L)  Activity Tolerance Patient tolerated treatment well  Patient left in bed;with call bell/phone within reach  OT Assessment/Plan  OT Plan Discharge plan remains appropriate  OT Visit Diagnosis Unsteadiness on feet (R26.81);Other abnormalities of gait and mobility (R26.89);Muscle weakness (generalized) (M62.81);Pain  OT Frequency (ACUTE ONLY) Min 3X/week  Follow Up Recommendations CIR;Supervision/Assistance - 24 hour  OT Equipment Other (comment) (defer to next venue)  AM-PAC OT "6 Clicks" Daily Activity Outcome Measure (Version 2)  Help from another person eating meals? 4  Help from another person taking care of personal grooming? 3  Help from another person toileting, which includes using toliet, bedpan, or urinal? 1  Help from another person bathing (including washing, rinsing, drying)? 2  Help from another person to put on and taking off regular upper body clothing? 3  Help from another person to put on and taking off regular lower body clothing? 1  6 Click Score  14  OT Goal Progression  Progress towards OT goals Progressing toward goals  Acute Rehab OT Goals  Patient Stated Goal to get stronger  OT Goal Formulation With patient  Time For Goal Achievement 11/09/18  Potential to Achieve Goals Good  OT Time Calculation  OT Start Time (ACUTE ONLY) 1540  OT Stop Time (ACUTE ONLY) 1605  OT Time Calculation (min) 25 min  OT General Charges  $OT Visit 1 Visit  OT Treatments  $Self Care/Home Management  8-22 mins  $Therapeutic Exercise 8-22 mins  Martie Round, OTR/L Acute Rehabilitation Services Pager: [phone] Office: [phone]

## 2018-10-30 NOTE — [place] FL2 (Signed)
[place] MEDICAID FL2 LEVEL OF CARE SCREENING TOOL     IDENTIFICATION  Patient Name: Aaron [name] Birthdate: 1965/08/27 Sex: male Admission Date (Current Location): 10/01/2018  [facility] [place] Number:  Producer, television/film/video and Address:  [facility], [address], [place], [place] [address]      Provider Number: [id number]  Attending Physician Name and Address:  Calvert Cantor, MD  Relative Name and Phone Number:  [facility]    Current Level of Care: Hospital Recommended Level of Care: Nursing Facility Prior Approval Number:    Date Approved/Denied:   PASRR Number: [id number]  Discharge Plan: SNF    Current Diagnoses: Patient Active Problem List   Diagnosis Date Noted  . Hematoma of right thigh 10/30/2018  . Acute blood loss anemia 10/30/2018  . Tobacco abuse 10/30/2018  . Gastritis with bleeding 10/30/2018  . Esophagitis, [place] grade C 10/30/2018  . Protein-calorie malnutrition, severe 10/26/2018  . Hypokalemia   . Atrial fibrillation with rapid ventricular response (HCC) 10/12/2018  . Morbid obesity (HCC) 10/09/2018  . Acute systolic CHF (congestive heart failure) (HCC) 10/02/2018  . Acute hypoxemic respiratory failure (HCC) 10/02/2018  . Anasarca 10/01/2018  . AKI (acute kidney injury) (HCC) 10/01/2018    Orientation RESPIRATION BLADDER Height & Weight     Self, Time, Situation, Place  Normal Continent Weight: (!) 137.2 kg Height:  6\' 4"  (193 cm)  BEHAVIORAL SYMPTOMS/MOOD NEUROLOGICAL BOWEL NUTRITION STATUS      Continent Diet(full liquid)  AMBULATORY STATUS COMMUNICATION OF NEEDS Skin   Extensive Assist Verbally Normal                       Personal Care Assistance Level of Assistance  Dressing, Bathing Bathing Assistance: Maximum assistance   Dressing Assistance: Maximum assistance     Functional Limitations Info             SPECIAL CARE FACTORS FREQUENCY  PT (By licensed PT), OT (By licensed OT)      PT Frequency: 5 x week OT Frequency: 5 x week            Contractures Contractures Info: Not present    Additional Factors Info  Psychotropic Code Status Info: full code Allergies Info: No known allergies Psychotropic Info: None listed on MAR         Current Medications (10/30/2018):  This is the current hospital active medication list Current Facility-Administered Medications  Medication Dose Route Frequency Provider Last Rate Last Dose  . 0.9 %  sodium chloride infusion (Manually program via Guardrails IV Fluids)   Intravenous Once Rizwan, Ladell Heads, MD      . 0.9 %  sodium chloride infusion  250 mL Intravenous PRN Kerin Salen, MD 10 mL/hr at 10/27/18 0837 250 mL at 10/27/18 0837  . acetaminophen (TYLENOL) tablet 650 mg  650 mg Oral Q4H PRN Kerin Salen, MD      . alum & mag hydroxide-simeth (MAALOX/MYLANTA) 200-200-20 MG/5ML suspension 30 mL  30 mL Oral Q4H PRN Kerin Salen, MD   30 mL at 10/16/18 0552  . amiodarone (PACERONE) tablet 200 mg  200 mg Oral BID Calvert Cantor, MD      . aspirin EC tablet 81 mg  81 mg Oral Daily Bensimhon, Bevelyn Buckles, MD   81 mg at 10/30/18 0945  . carvedilol (COREG) tablet 3.125 mg  3.125 mg Oral BID WC Bensimhon, Bevelyn Buckles, MD   3.125 mg at 10/30/18 0945  .  digoxin (LANOXIN) tablet 0.125 mg  0.125 mg Oral Daily Bensimhon, Bevelyn Buckles, MD   0.125 mg at 10/30/18 0946  . feeding supplement (ENSURE ENLIVE) (ENSURE ENLIVE) liquid 237 mL  237 mL Oral TID BM Noralee Stain, DO   237 mL at 10/30/18 0946  . hydrocortisone (ANUSOL-HC) suppository 25 mg  25 mg Rectal BID Kerin Salen, MD   25 mg at 10/13/18 1133  . losartan (COZAAR) tablet 25 mg  25 mg Oral Daily Bensimhon, Bevelyn Buckles, MD   25 mg at 10/30/18 0945  . MEDLINE mouth rinse  15 mL Mouth Rinse BID Kerin Salen, MD   15 mL at 10/29/18 2154  . metoCLOPramide (REGLAN) injection 10 mg  10 mg Intravenous Q8H Noralee Stain, DO   10 mg at 10/30/18 0603  . nicotine (NICODERM CQ - dosed in mg/24 hours) patch 14 mg  14  mg Transdermal Daily Calvert Cantor, MD   14 mg at 10/30/18 0947  . ondansetron (ZOFRAN) injection 4 mg  4 mg Intravenous Q6H PRN Kerin Salen, MD   4 mg at 10/27/18 1547  . pantoprazole (PROTONIX) injection 40 mg  40 mg Intravenous Q12H Kerin Salen, MD   40 mg at 10/30/18 0948  . polyethylene glycol (MIRALAX / GLYCOLAX) packet 17 g  17 g Oral BID Brahmbhatt, Parag, MD   17 g at 10/30/18 0945  . sodium chloride flush (NS) 0.9 % injection 10-40 mL  10-40 mL Intracatheter Q12H Kerin Salen, MD   10 mL at 10/30/18 0959  . sodium chloride flush (NS) 0.9 % injection 10-40 mL  10-40 mL Intracatheter PRN Kerin Salen, MD      . sodium chloride flush (NS) 0.9 % injection 3 mL  3 mL Intravenous Q12H Kerin Salen, MD   3 mL at 10/30/18 1000  . sodium chloride flush (NS) 0.9 % injection 3 mL  3 mL Intravenous PRN Kerin Salen, MD      . sodium phosphate (FLEET) 7-19 GM/118ML enema 1 enema  1 enema Rectal Daily Brahmbhatt, Parag, MD   1 enema at 10/30/18 1000  . spironolactone (ALDACTONE) tablet 25 mg  25 mg Oral Daily Noralee Stain, DO   25 mg at 10/30/18 0945  . witch hazel-glycerin (TUCKS) pad   Topical PRN Kerin Salen, MD         Discharge Medications: Please see discharge summary for a list of discharge medications.  Relevant Imaging Results:  Relevant Lab Results:   Additional Information [id number]  Gala Lewandowsky, RN

## 2018-10-31 LAB — CBC
HCT: 24.7 % — ABNORMAL LOW (ref 39.0–52.0)
Hemoglobin: 7.6 g/dL — ABNORMAL LOW (ref 13.0–17.0)
MCH: 27.9 pg (ref 26.0–34.0)
MCHC: 30.8 g/dL (ref 30.0–36.0)
MCV: 90.8 fL (ref 80.0–100.0)
Platelets: 259 10*3/uL (ref 150–400)
RBC: 2.72 MIL/uL — ABNORMAL LOW (ref 4.22–5.81)
RDW: 17.8 % — ABNORMAL HIGH (ref 11.5–15.5)
WBC: 7.9 10*3/uL (ref 4.0–10.5)
nRBC: 0 % (ref 0.0–0.2)

## 2018-10-31 LAB — BASIC METABOLIC PANEL
Anion gap: 7 (ref 5–15)
BUN: 23 mg/dL — ABNORMAL HIGH (ref 6–20)
CO2: 26 mmol/L (ref 22–32)
Calcium: 7.8 mg/dL — ABNORMAL LOW (ref 8.9–10.3)
Chloride: 101 mmol/L (ref 98–111)
Creatinine, Ser: 0.89 mg/dL (ref 0.61–1.24)
GFR calc Af Amer: >60 mL/min (ref >60)
GFR calc non Af Amer: >60 mL/min (ref >60)
Glucose, Bld: 108 mg/dL — ABNORMAL HIGH (ref 70–99)
Potassium: 3.5 mmol/L (ref 3.5–5.1)
Sodium: 134 mmol/L — ABNORMAL LOW (ref 135–145)

## 2018-10-31 MED ORDER — FLEET ENEMA 7-19 GM/118ML RE ENEM: 1.0000 | ENEMA | Freq: Every day | RECTAL | Status: DC | PRN

## 2018-10-31 MED ORDER — POLYETHYLENE GLYCOL 3350 17 G PO PACK: 17.0000 g | PACK | Freq: Every day | ORAL | Status: DC | PRN

## 2018-10-31 MED ORDER — PANTOPRAZOLE SODIUM 40 MG PO TBEC
40.0000 mg | DELAYED_RELEASE_TABLET | Freq: Two times a day (BID) | ORAL | Status: DC
  Administered 2018-10-31 – 2018-11-05 (×10): 40 mg via ORAL
  Filled 2018-10-31 (×10): qty 1

## 2018-10-31 NOTE — Progress Notes (Signed)
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 In the ED patient was noted to be tachycardic, labs revealed AKI with BUN of 53, creatinine 1.4, his LFTs were slightly elevated in 200s and BNP was elevated at 2769. CT abdomen/pelvis showed diffuse anasarca, ascites and small bilateral pleural effusions.  Patient was admitted for further work-up of anasarca. Echocardiogram revealed EF of 15% with diffuse hypokinesis with left atrial moderately dilated, moderate MR and mild AR. Patient was managed with Coreg and Lasix IV/milrinone drip/Spironolactone.   Hospital course was complicated by A. fib with RVR requiring heparin drip.  Patient subsequently underwent cardiac cath on March 13 which revealed severe three-vessel CAD (Moderate mid LAD disease, tight distal lesion beyond reach of IMA. Severe disease in circumflex. RCA totally occluded).PCI of LAD/LCx versus CABG was considered but cardiothoracic surgery evaluated patient and did not feel he is candidate for CABG, recommended aggressive medical therapy.   On March 18 patient developed pain in his right thigh and noticed a hard swelling for which CT lower extremity was obtained revealing a hematoma.   Hospital course also complicated by upper GI bleed around the same time prompting GI evaluation- EGD on 3/22revealing small esophageal ulcers, gastritis.  Subsequently found to have an adynamic ileus/bowel obstruction. NG tube placed. Patient transferred back to [facility] due to ongoing multiple medical conditions.   Subjective: No complaints.    Assessment & Plan:   Principal Problem:   Acute systolic CHF (congestive heart failure) /    Anasarca - EF 10-15 % - has resolved - currently on daily Aldactone 25 mg and maintaining weight/ fluid balance - cont Losartan and Coreg   Active Problems:  Right thigh hematoma - has stabilized -  he needs PT as he is not able to lift up the right leg  Ileus - NG removed yesterday - advance to solids today  Acute blood loss anemia Upper GI bleed - EGD 3/22 > gastritis and LA grade C esophagitis- GI states it is safe to resume his anticoagulation - f/u with Dr Marca Ancona in 2-3 months please -1 U PRBC with a dose of Lasix given on 4/2  Hypokalemia - replace    A-fib/ NSVT - GI feels comfortable with resuming anticoagulation- hematoma has been stable as well- - resumed Eliquis on 4/3  - Amiodarone switched to oral on 4/3 200 BID - cont and Dig  Morbid obesity Body mass index is 36.52 kg/m. - needs to lose weight   Tobacco abuse - weaned nicotine patch from 21 to 14 mg  Time spent in minutes: 35  DVT prophylaxis: Lovenox Code Status: full code Family Communication:  Disposition Plan: CIR when able to participate in 3 hrs of PT a day Consultants:   Cardiology  Cardiothoracic surgery  Orthopedic surgery  GI Procedures:   LHC on 10/09/2018: Severe three vessel disease  PCI/CABG considered, but pt is deemed a poor candidate. Medicall therapy only.  EGD 3/22 for GI bleed: small esophageal ulcers, gastritis. Antimicrobials:  Anti-infectives (From admission, onward)   Start     Dose/Rate Route Frequency Ordered Stop   10/01/18 1515  cefTRIAXone (ROCEPHIN) 1 g in sodium chloride 0.9 % 100 mL IVPB  1 g 200 mL/hr over 30 Minutes Intravenous  Once 10/01/18 1502 10/01/18 2311   10/01/18 1515  azithromycin (ZITHROMAX) tablet 500 mg     500 mg Oral  Once 10/01/18 1502 10/01/18 1510       Objective: Vitals:   10/30/18 0945 10/30/18 1626 10/30/18 2006 10/31/18 0500  BP: 97/68 103/67 99/61 108/63  Pulse: 88 84 79 83  Resp:  18 18 19   Temp:  98.1 F (36.7 C) 98.6  F (37 C) 98.3 F (36.8 C)  TempSrc:  Oral Oral Oral  SpO2:  97% 95% 96%  Weight:    136.1 kg  Height:        Intake/Output Summary (Last 24 hours) at 10/31/2018 0947 Last data filed at 10/31/2018 0729 Gross per 24 hour  Intake 720 ml  Output 660 ml  Net 60 ml   Filed Weights   10/29/18 0605 10/30/18 0419 10/31/18 0500  Weight: 134.3 kg (!) 137.2 kg 136.1 kg    Examination: General exam: Appears comfortable  HEENT: PERRLA, oral mucosa moist, no sclera icterus or thrush Respiratory system: Clear to auscultation. Respiratory effort normal. Cardiovascular system: S1 & S2 heard,  No murmurs  Gastrointestinal system: Abdomen soft, non-tender, nondistended. Normal bowel sounds   Central nervous system: Alert and oriented. No focal neurological deficits. Extremities: No cyanosis, clubbing or edema Skin: No rashes or ulcers Psychiatry:  Mood & affect appropriate.   Data Reviewed: I have personally reviewed following labs and imaging studies  CBC: Recent Labs  Lab 10/26/18 0405 10/27/18 0550 10/28/18 0433 10/29/18 0339 10/29/18 2222 10/31/18 0505  WBC 14.1* 13.3* 13.7* 13.0*  --  7.9  NEUTROABS  --   --   --  11.1*  --   --   HGB 7.8* 7.7* 7.7* 7.1* 8.0* 7.6*  HCT 25.3* 24.2* 24.5* 23.7* 25.8* 24.7*  MCV 90.7 91.3 92.5 92.9  --  90.8  PLT 230 216 202 213  --  259   Basic Metabolic Panel: Recent Labs  Lab 10/25/18 0509 10/26/18 0405 10/27/18 0550 10/28/18 0433 10/29/18 0339 10/31/18 0505  NA 134* 132* 132* 133* 131* 134*  K 3.2* 3.3* 3.4* 3.9 3.4* 3.5  CL 103 102 100 100 101 101  CO2 25 23 23 25 27 26   GLUCOSE 105* 141* 166* 154* 126* 108*  BUN 18 16 15 16 20  23*  CREATININE 0.94 0.82 0.77 0.94 0.90 0.89  CALCIUM 7.7* 7.5* 7.6* 7.9* 7.5* 7.8*  MG 2.0  --   --  1.9  --   --   PHOS 2.7  --   --   --   --   --    GFR: Estimated Creatinine Clearance: 142.9 mL/min (by C-G formula based on SCr of 0.89 mg/dL). Liver Function Tests: No results for input(s): AST,  ALT, ALKPHOS, BILITOT, PROT, ALBUMIN in the last 168 hours. No results for input(s): LIPASE, AMYLASE in the last 168 hours. No results for input(s): AMMONIA in the last 168 hours. Coagulation Profile: No results for input(s): INR, PROTIME in the last 168 hours. Cardiac Enzymes: No results for input(s): CKTOTAL, CKMB, CKMBINDEX, TROPONINI in the last 168 hours. BNP (last 3 results) No results for input(s): PROBNP in the last 8760 hours. HbA1C: No results for input(s): HGBA1C in the last 72 hours. CBG: No results for input(s): GLUCAP in the last 168 hours. Lipid Profile: No results for input(s): CHOL, HDL, LDLCALC, TRIG, CHOLHDL, LDLDIRECT in the last 72 hours. Thyroid Function Tests: No results  for input(s): TSH, T4TOTAL, FREET4, T3FREE, THYROIDAB in the last 72 hours. Anemia Panel: No results for input(s): VITAMINB12, FOLATE, FERRITIN, TIBC, IRON, RETICCTPCT in the last 72 hours. Urine analysis:    Component Value Date/Time   COLORURINE AMBER (A) 10/01/2018 1523   APPEARANCEUR HAZY (A) 10/01/2018 1523   LABSPEC 1.023 10/01/2018 1523   PHURINE 5.0 10/01/2018 1523   GLUCOSEU NEGATIVE 10/01/2018 1523   HGBUR SMALL (A) 10/01/2018 1523   BILIRUBINUR NEGATIVE 10/01/2018 1523   KETONESUR NEGATIVE 10/01/2018 1523   PROTEINUR NEGATIVE 10/01/2018 1523   NITRITE NEGATIVE 10/01/2018 1523   LEUKOCYTESUR NEGATIVE 10/01/2018 1523   Sepsis Labs: @LABRCNTIP (procalcitonin:4,lacticidven:4) )No results found for this or any previous visit (from the past 240 hour(s)).       Radiology Studies: Dg Abd Portable 1v  Result Date: 10/30/2018 CLINICAL DATA:  Ileus, NG tube EXAM: PORTABLE ABDOMEN - 1 VIEW COMPARISON:  10/27/2018 FINDINGS: NG tube remains in stable position. Gaseous distention of bowel again noted, unchanged. This likely reflects ileus as large and small bowel are involved. No free air or organomegaly. IMPRESSION: Continued diffuse gaseous distention of bowel, likely ileus. No change.  Electronically Signed   By: Charlett Nose M.D.   On: 10/30/2018 07:37      Scheduled Meds: . sodium chloride   Intravenous Once  . amiodarone  200 mg Oral BID  . apixaban  5 mg Oral BID  . aspirin EC  81 mg Oral Daily  . carvedilol  3.125 mg Oral BID WC  . digoxin  0.125 mg Oral Daily  . feeding supplement (ENSURE ENLIVE)  237 mL Oral TID BM  . hydrocortisone  25 mg Rectal BID  . losartan  25 mg Oral Daily  . mouth rinse  15 mL Mouth Rinse BID  . metoCLOPramide (REGLAN) injection  10 mg Intravenous Q8H  . nicotine  14 mg Transdermal Daily  . pantoprazole (PROTONIX) IV  40 mg Intravenous Q12H  . sodium chloride flush  10-40 mL Intracatheter Q12H  . sodium chloride flush  3 mL Intravenous Q12H  . spironolactone  25 mg Oral Daily   Continuous Infusions: . sodium chloride 250 mL (10/27/18 0837)     LOS: 30 days      Calvert Cantor, MD [facility] Pager: [email or web address] Password [facility] 10/31/2018, 9:47 AM

## 2018-10-31 NOTE — Progress Notes (Signed)
Physical Therapy Treatment Patient Details Name: Aaron [name] MRN: [id number] DOB: 1966/04/23 Today's Date: 10/31/2018    History of Present Illness Pt is a 53 y.o. male admitted 10/01/18 with progressive fluid retention, abdominal/BLE swelling. Worked up for anasarca and CHF with EF 15%. Transferred to [facility] for cardiac cath 3/13, found to have multivessel CAD; not a candidate for CABG. CT 3/19 with acute R thigh hematoma; planned PCI deferred secondary to this. Pt with positive gastric occult blood; KUB shows ileus, s/p NGT placement. PMH includes obesity.    PT Comments    Pt tolerated LE exercises and sitting EOB well this session, but limited by fatigue. PT attempted to work on lateral and AP scooting with pt at EOB, but pt unable to shift weight effectively even with PT cuing and physical assist. Pt experienced momentary dizziness upon sitting EOB this session, but dizziness improved quickly. PT to continue to progress mobility as able.   BP supine: 95/63 BP in chair position of bed: 109/66 BP sitting EOB: 99/60   RR: 20-30 bpm HR and O2sat on 2LO2 via Garza-Sal[place] II: WFL   Follow Up Recommendations  Supervision/Assistance - 24 hour;CIR     Equipment Recommendations  Other (comment)(To be determined)    Recommendations for Other Services       Precautions / Restrictions Precautions Precautions: Fall Precaution Comments: Watch BP; VitalGo tilt bed Required Braces or Orthoses: Other Brace Other Brace: RLE Bledsoe brace locked in extension when up Restrictions Weight Bearing Restrictions: Yes RLE Weight Bearing: Weight bearing as tolerated    Mobility  Bed Mobility Overal bed mobility: Needs Assistance Bed Mobility: Supine to Sit;Sit to Supine     Supine to sit: Mod assist Sit to supine: Mod assist;+2 for physical assistance;+2 for safety/equipment   General bed mobility comments: PT donned bedsloe brace for pt in supine prior to bed mobility. Mod assist for bringing bilateral LEs  off and onto bed, elevation and lowering of trunk. Pt with use of bedrails to help with trunk elevation. +2 assist to scoot pt up in bed upon return to supine.  Transfers Overall transfer level: (NT - pt fatigued with sitting EOB ~5 minutes)                  Ambulation/Gait                 Stairs             Wheelchair Mobility    Modified Rankin (Stroke Patients Only)       Balance Overall balance assessment: Needs assistance   Sitting balance-Leahy Scale: Fair Sitting balance - Comments: Pt sat EOB x 5 minutes with LEs dangling. Attempted to work on scooting with pt at EOB, but pt unable to shift weight enough to perform lateral and AP scooting this session.                                    Cognition Arousal/Alertness: Awake/alert Behavior During Therapy: WFL for tasks assessed/performed Overall Cognitive Status: Within Functional Limits for tasks assessed                                        Exercises General Exercises - Lower Extremity Ankle Circles/Pumps: AROM;Both;Supine;20 reps Quad Sets: AROM;Both;10 reps;Supine Gluteal Sets: AROM;Both;15 reps;Standing Hip Flexion/Marching: Both;AAROM;Supine;10 reps  General Comments        Pertinent Vitals/Pain Pain Assessment: No/denies pain Pain Intervention(s): Monitored during session;Repositioned    Home Living                      Prior Function            PT Goals (current goals can now be found in the care plan section) Acute Rehab PT Goals Patient Stated Goal: to get stronger PT Goal Formulation: With patient Time For Goal Achievement: 11/04/18 Potential to Achieve Goals: Good Progress towards PT goals: Progressing toward goals    Frequency    Min 3X/week      PT Plan Current plan remains appropriate    Co-evaluation              AM-PAC PT "6 Clicks" Mobility   Outcome Measure  Help needed turning from your back to your  side while in a flat bed without using bedrails?: A Little Help needed moving from lying on your back to sitting on the side of a flat bed without using bedrails?: A Little Help needed moving to and from a bed to a chair (including a wheelchair)?: Total Help needed standing up from a chair using your arms (e.g., wheelchair or bedside chair)?: Total Help needed to walk in hospital room?: Total Help needed climbing 3-5 steps with a railing? : Total 6 Click Score: 10    End of Session Equipment Utilized During Treatment: Gait belt;Oxygen Activity Tolerance: Patient tolerated treatment well;Patient limited by fatigue Patient left: in bed;with call bell/phone within reach Nurse Communication: Mobility status PT Visit Diagnosis: Muscle weakness (generalized) (M62.81);Difficulty in walking, not elsewhere classified (R26.2);Pain     Time: 5929-2446 PT Time Calculation (min) (ACUTE ONLY): 30 min  Charges:  $Therapeutic Exercise: 8-22 mins $Therapeutic Activity: 8-22 mins                     Dominik Yordy Terrial Rhodes, PT Acute Rehabilitation Services Pager [phone]  Office [phone]    Fredna Stricker D Despina Hidden 10/31/2018, 5:23 PM

## 2018-11-01 LAB — BASIC METABOLIC PANEL
Anion gap: 6 (ref 5–15)
BUN: 26 mg/dL — ABNORMAL HIGH (ref 6–20)
CO2: 27 mmol/L (ref 22–32)
Calcium: 7.8 mg/dL — ABNORMAL LOW (ref 8.9–10.3)
Chloride: 100 mmol/L (ref 98–111)
Creatinine, Ser: 0.87 mg/dL (ref 0.61–1.24)
GFR calc Af Amer: >60 mL/min (ref >60)
GFR calc non Af Amer: >60 mL/min (ref >60)
Glucose, Bld: 112 mg/dL — ABNORMAL HIGH (ref 70–99)
Potassium: 3.5 mmol/L (ref 3.5–5.1)
Sodium: 133 mmol/L — ABNORMAL LOW (ref 135–145)

## 2018-11-01 LAB — CBC
HCT: 24.6 % — ABNORMAL LOW (ref 39.0–52.0)
Hemoglobin: 7.6 g/dL — ABNORMAL LOW (ref 13.0–17.0)
MCH: 28 pg (ref 26.0–34.0)
MCHC: 30.9 g/dL (ref 30.0–36.0)
MCV: 90.8 fL (ref 80.0–100.0)
Platelets: 263 10*3/uL (ref 150–400)
RBC: 2.71 MIL/uL — ABNORMAL LOW (ref 4.22–5.81)
RDW: 17.6 % — ABNORMAL HIGH (ref 11.5–15.5)
WBC: 6 10*3/uL (ref 4.0–10.5)
nRBC: 0 % (ref 0.0–0.2)

## 2018-11-01 MED ORDER — SALINE SPRAY 0.65 % NA SOLN
1.0000 | NASAL | Status: DC | PRN
  Administered 2018-11-01: 1 via NASAL
  Filled 2018-11-01: qty 44

## 2018-11-01 NOTE — Progress Notes (Signed)
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 In the ED patient was noted to be tachycardic, labs revealed AKI with BUN of 53, creatinine 1.4, his LFTs were slightly elevated in 200s and BNP was elevated at 2769. CT abdomen/pelvis showed diffuse anasarca, ascites and small bilateral pleural effusions.  Patient was admitted for further work-up of anasarca. Echocardiogram revealed EF of 15% with diffuse hypokinesis with left atrial moderately dilated, moderate MR and mild AR. Patient was managed with Coreg and Lasix IV/milrinone drip/Spironolactone.   Hospital course was complicated by A. fib with RVR requiring heparin drip.  Patient subsequently underwent cardiac cath on March 13 which revealed severe three-vessel CAD (Moderate mid LAD disease, tight distal lesion beyond reach of IMA. Severe disease in circumflex. RCA totally occluded).PCI of LAD/LCx versus CABG was considered but cardiothoracic surgery evaluated patient and did not feel he is candidate for CABG, recommended aggressive medical therapy.   On March 18 patient developed pain in his right thigh and noticed a hard swelling for which CT lower extremity was obtained revealing a hematoma.   Hospital course also complicated by upper GI bleed around the same time prompting GI evaluation- EGD on 3/22revealing small esophageal ulcers, gastritis.  Subsequently found to have an adynamic ileus/bowel obstruction. NG tube placed. Patient transferred back to [facility] due to ongoing multiple medical conditions.   Subjective: Dry nose with clots in it. No other complaints.    Assessment & Plan:   Principal Problem:   Acute systolic CHF  (congestive heart failure) /   Anasarca - EF 10-15 % - has resolved - currently on daily Aldactone 25 mg and maintaining weight/ fluid balance - cont Losartan and Coreg   Active Problems:  Right thigh hematoma - has stabilized -  he needs PT as he is not able to lift up the right leg   Acute blood loss anemia Upper GI bleed - EGD 3/22 > gastritis and LA grade C esophagitis- GI states it is safe to resume his anticoagulation - f/u with Dr Marca Ancona in 2-3 months please -1 U PRBC with a dose of Lasix given on 4/2  Ileus - after GI bleed, EGD/ Colonoscopy - has resolved - NG removed 4/3- advance to solids 4/4  Hypokalemia - replace    A-fib/ NSVT - GI feels comfortable with resuming anticoagulation- hematoma has been stable as well- - resumed Eliquis on 4/3  - Amiodarone switched to oral on 4/3 200 BID - cont and Dig  Morbid obesity Body mass index is 36.82 kg/m. - needs to lose weight   Tobacco abuse - weaned nicotine patch from 21 to 14 mg  Dry nose with blood clots - remove O2- use saline nasal spray  Time spent in minutes: 35  DVT prophylaxis: Lovenox Code Status: full code Family Communication:  Disposition Plan: CIR when able to participate in 3 hrs of PT a day Consultants:   Cardiology  Cardiothoracic surgery  Orthopedic surgery  GI Procedures:   LHC on 10/09/2018: Severe three vessel disease  PCI/CABG considered, but pt is deemed a poor candidate. Medicall therapy only.  EGD 3/22 for GI bleed: small esophageal ulcers, gastritis. Antimicrobials:  Anti-infectives (From admission, onward)  Start     Dose/Rate Route Frequency Ordered Stop   10/01/18 1515  cefTRIAXone (ROCEPHIN) 1 g in sodium chloride 0.9 % 100 mL IVPB     1 g 200 mL/hr over 30 Minutes Intravenous  Once 10/01/18 1502 10/01/18 2311   10/01/18 1515  azithromycin (ZITHROMAX) tablet 500 mg     500 mg Oral  Once 10/01/18 1502 10/01/18 1510       Objective: Vitals:   10/31/18 1534  10/31/18 1800 10/31/18 2012 11/01/18 0341  BP: 103/74  (!) 110/59 108/67  Pulse: 91 86 80 74  Resp: (!) 25 20    Temp: 97.6 F (36.4 C)  97.9 F (36.6 C) 98 F (36.7 C)  TempSrc: Oral  Oral Oral  SpO2: 98% 94% 95% 95%  Weight:    (!) 137.2 kg  Height:        Intake/Output Summary (Last 24 hours) at 11/01/2018 0913 Last data filed at 11/01/2018 0630 Gross per 24 hour  Intake 1120 ml  Output 300 ml  Net 820 ml   Filed Weights   10/30/18 0419 10/31/18 0500 11/01/18 0341  Weight: (!) 137.2 kg 136.1 kg (!) 137.2 kg    Examination: General exam: Appears comfortable  HEENT: PERRLA, oral mucosa moist, no sclera icterus or thrush Respiratory system: Clear to auscultation. Respiratory effort normal. Cardiovascular system: S1 & S2 heard,  No murmurs  Gastrointestinal system: Abdomen soft, non-tender, nondistended. Normal bowel sounds   Central nervous system: Alert and oriented. No focal neurological deficits. Extremities: No cyanosis, clubbing or edema Skin: No rashes or ulcers Psychiatry:  Mood & affect appropriate.   Data Reviewed: I have personally reviewed following labs and imaging studies  CBC: Recent Labs  Lab 10/27/18 0550 10/28/18 0433 10/29/18 0339 10/29/18 2222 10/31/18 0505 11/01/18 0348  WBC 13.3* 13.7* 13.0*  --  7.9 6.0  NEUTROABS  --   --  11.1*  --   --   --   HGB 7.7* 7.7* 7.1* 8.0* 7.6* 7.6*  HCT 24.2* 24.5* 23.7* 25.8* 24.7* 24.6*  MCV 91.3 92.5 92.9  --  90.8 90.8  PLT 216 202 213  --  259 263   Basic Metabolic Panel: Recent Labs  Lab 10/27/18 0550 10/28/18 0433 10/29/18 0339 10/31/18 0505 11/01/18 0348  NA 132* 133* 131* 134* 133*  K 3.4* 3.9 3.4* 3.5 3.5  CL 100 100 101 101 100  CO2 23 25 27 26 27   GLUCOSE 166* 154* 126* 108* 112*  BUN 15 16 20  23* 26*  CREATININE 0.77 0.94 0.90 0.89 0.87  CALCIUM 7.6* 7.9* 7.5* 7.8* 7.8*  MG  --  1.9  --   --   --    GFR: Estimated Creatinine Clearance: 146.9 mL/min (by C-G formula based on SCr of  0.87 mg/dL). Liver Function Tests: No results for input(s): AST, ALT, ALKPHOS, BILITOT, PROT, ALBUMIN in the last 168 hours. No results for input(s): LIPASE, AMYLASE in the last 168 hours. No results for input(s): AMMONIA in the last 168 hours. Coagulation Profile: No results for input(s): INR, PROTIME in the last 168 hours. Cardiac Enzymes: No results for input(s): CKTOTAL, CKMB, CKMBINDEX, TROPONINI in the last 168 hours. BNP (last 3 results) No results for input(s): PROBNP in the last 8760 hours. HbA1C: No results for input(s): HGBA1C in the last 72 hours. CBG: No results for input(s): GLUCAP in the last 168 hours. Lipid Profile: No results for input(s): CHOL, HDL, LDLCALC, TRIG, CHOLHDL, LDLDIRECT in the last 72  hours. Thyroid Function Tests: No results for input(s): TSH, T4TOTAL, FREET4, T3FREE, THYROIDAB in the last 72 hours. Anemia Panel: No results for input(s): VITAMINB12, FOLATE, FERRITIN, TIBC, IRON, RETICCTPCT in the last 72 hours. Urine analysis:    Component Value Date/Time   COLORURINE AMBER (A) 10/01/2018 1523   APPEARANCEUR HAZY (A) 10/01/2018 1523   LABSPEC 1.023 10/01/2018 1523   PHURINE 5.0 10/01/2018 1523   GLUCOSEU NEGATIVE 10/01/2018 1523   HGBUR SMALL (A) 10/01/2018 1523   BILIRUBINUR NEGATIVE 10/01/2018 1523   KETONESUR NEGATIVE 10/01/2018 1523   PROTEINUR NEGATIVE 10/01/2018 1523   NITRITE NEGATIVE 10/01/2018 1523   LEUKOCYTESUR NEGATIVE 10/01/2018 1523   Sepsis Labs: @LABRCNTIP (procalcitonin:4,lacticidven:4) )No results found for this or any previous visit (from the past 240 hour(s)).       Radiology Studies: No results found.    Scheduled Meds: . sodium chloride   Intravenous Once  . amiodarone  200 mg Oral BID  . apixaban  5 mg Oral BID  . aspirin EC  81 mg Oral Daily  . carvedilol  3.125 mg Oral BID WC  . digoxin  0.125 mg Oral Daily  . feeding supplement (ENSURE ENLIVE)  237 mL Oral TID BM  . losartan  25 mg Oral Daily  . mouth  rinse  15 mL Mouth Rinse BID  . nicotine  14 mg Transdermal Daily  . pantoprazole  40 mg Oral BID AC  . sodium chloride flush  10-40 mL Intracatheter Q12H  . sodium chloride flush  3 mL Intravenous Q12H  . spironolactone  25 mg Oral Daily   Continuous Infusions: . sodium chloride 250 mL (10/27/18 0837)     LOS: 31 days      Calvert Cantor, MD [facility] Pager: [email or web address] Password [facility] 11/01/2018, 9:13 AM

## 2018-11-01 NOTE — Plan of Care (Signed)
  Problem: Clinical Measurements: Goal: Ability to maintain clinical measurements within normal limits will improve Outcome: Progressing Goal: Will remain free from infection Outcome: Progressing   

## 2018-11-02 LAB — CBC
HCT: 23.7 % — ABNORMAL LOW (ref 39.0–52.0)
Hemoglobin: 7.5 g/dL — ABNORMAL LOW (ref 13.0–17.0)
MCH: 28.2 pg (ref 26.0–34.0)
MCHC: 31.6 g/dL (ref 30.0–36.0)
MCV: 89.1 fL (ref 80.0–100.0)
Platelets: 290 10*3/uL (ref 150–400)
RBC: 2.66 MIL/uL — ABNORMAL LOW (ref 4.22–5.81)
RDW: 17.6 % — ABNORMAL HIGH (ref 11.5–15.5)
WBC: 5.5 10*3/uL (ref 4.0–10.5)
nRBC: 0 % (ref 0.0–0.2)

## 2018-11-02 LAB — RETICULOCYTES
Immature Retic Fract: 15.8 % (ref 2.3–15.9)
RBC.: 2.77 MIL/uL — ABNORMAL LOW (ref 4.22–5.81)
Retic Count, Absolute: 57.9 10*3/uL (ref 19.0–186.0)
Retic Ct Pct: 2.1 % (ref 0.4–3.1)

## 2018-11-02 LAB — BASIC METABOLIC PANEL
Anion gap: 8 (ref 5–15)
BUN: 23 mg/dL — ABNORMAL HIGH (ref 6–20)
CO2: 27 mmol/L (ref 22–32)
Calcium: 7.9 mg/dL — ABNORMAL LOW (ref 8.9–10.3)
Chloride: 99 mmol/L (ref 98–111)
Creatinine, Ser: 0.88 mg/dL (ref 0.61–1.24)
GFR calc Af Amer: >60 mL/min (ref >60)
GFR calc non Af Amer: >60 mL/min (ref >60)
Glucose, Bld: 105 mg/dL — ABNORMAL HIGH (ref 70–99)
Potassium: 3.5 mmol/L (ref 3.5–5.1)
Sodium: 134 mmol/L — ABNORMAL LOW (ref 135–145)

## 2018-11-02 LAB — IRON AND TIBC
Iron: 17 ug/dL — ABNORMAL LOW (ref 45–182)
Saturation Ratios: 10 % — ABNORMAL LOW (ref 17.9–39.5)
TIBC: 174 ug/dL — ABNORMAL LOW (ref 250–450)
UIBC: 157 ug/dL

## 2018-11-02 LAB — FERRITIN: Ferritin: 542 ng/mL — ABNORMAL HIGH (ref 24–336)

## 2018-11-02 LAB — FOLATE: Folate: 4.2 ng/mL — ABNORMAL LOW (ref >5.9)

## 2018-11-02 MED ORDER — ROSUVASTATIN CALCIUM 20 MG PO TABS
20.0000 mg | ORAL_TABLET | Freq: Every day | ORAL | Status: DC
  Administered 2018-11-02 – 2018-11-04 (×3): 20 mg via ORAL
  Filled 2018-11-02 (×3): qty 1

## 2018-11-02 NOTE — Progress Notes (Signed)
Physical Therapy Treatment Patient Details Name: Jaykob Gadison MRN: [id number] DOB: [birth year] Today's Date: 11/02/2018    History of Present Illness Pt is a 53 y.o. male admitted 10/01/18 with progressive fluid retention, abdominal/BLE swelling. Worked up for anasarca and CHF with EF 15%. Transferred to [facility] for cardiac cath 3/13, found to have multivessel CAD; not a candidate for CABG. CT 3/19 with acute R thigh hematoma; planned PCI deferred secondary to this. Pt with positive gastric occult blood; KUB shows ileus, s/p NGT placement. PMH includes obesity.    PT Comments    Returned to pt room for another tilt. BP limiting today- 95/65 at 3 deg, 93/57 at 10 deg. Pt had a BM and x2 PT rolled patient for NT to clean.    Follow Up Recommendations  Supervision/Assistance - 24 hour;SNF     Equipment Recommendations  Other (comment)    Recommendations for Other Services       Precautions / Restrictions Precautions Precautions: Fall Precaution Comments: Watch BP; VitalGo tilt bed Required Braces or Orthoses: Other Brace Other Brace: RLE Bledsoe brace locked in extension when up- okay to unlock at EOB  Restrictions RLE Weight Bearing: Weight bearing as tolerated    Mobility  Bed Mobility Overal bed mobility: Needs Assistance Bed Mobility: Rolling Rolling: Mod assist;+2 for physical assistance   Supine to sit: Mod assist     General bed mobility comments: assistance x2 due to body length for rolling while cleaning BM  Transfers                    Ambulation/Gait                 Stairs             Wheelchair Mobility    Modified Rankin (Stroke Patients Only)       Balance Overall balance assessment: Needs assistance Sitting-balance support: Feet supported(bil UE holding hand rails)         Standing balance-Leahy Scale: Zero                              Cognition Arousal/Alertness: Awake/alert Behavior During Therapy: WFL for  tasks assessed/performed Overall Cognitive Status: Within Functional Limits for tasks assessed                                        Exercises      General Comments General comments (skin integrity, edema, etc.): BP with 20 point SBP drop noted      Pertinent Vitals/Pain Pain Assessment: No/denies pain    Home Living                      Prior Function            PT Goals (current goals can now be found in the care plan section) Acute Rehab PT Goals Patient Stated Goal: to sit up     Frequency    Min 3X/week      PT Plan Current plan remains appropriate    Co-evaluation              AM-PAC PT "6 Clicks" Mobility   Outcome Measure  Help needed turning from your back to your side while in a flat bed without using bedrails?: A Lot Help needed moving from lying on  your back to sitting on the side of a flat bed without using bedrails?: A Lot Help needed moving to and from a bed to a chair (including a wheelchair)?: Total Help needed standing up from a chair using your arms (e.g., wheelchair or bedside chair)?: Total Help needed to walk in hospital room?: Total Help needed climbing 3-5 steps with a railing? : Total 6 Click Score: 8    End of Session   Activity Tolerance: Treatment limited secondary to medical complications (Comment) Patient left: in bed;with call bell/phone within reach   PT Visit Diagnosis: Muscle weakness (generalized) (M62.81);Difficulty in walking, not elsewhere classified (R26.2);Pain     Time: 1500-1530 PT Time Calculation (min) (ACUTE ONLY): 30 min  Charges:  $Therapeutic Activity: 23-37 mins                    Army Fossa PT, DPT Acute Rehab [phone]

## 2018-11-02 NOTE — Progress Notes (Signed)
Occupational Therapy Treatment Patient Details Name: Aaron [name] MRN: [id number] DOB: 06/04/1966 Today's Date: 11/02/2018    History of present illness Pt is a 53 y.o. male admitted 10/01/18 with progressive fluid retention, abdominal/BLE swelling. Worked up for anasarca and CHF with EF 15%. Transferred to [facility] for cardiac cath 3/13, found to have multivessel CAD; not a candidate for CABG. CT 3/19 with acute R thigh hematoma; planned PCI deferred secondary to this. Pt with positive gastric occult blood; KUB shows ileus, s/p NGT placement. PMH includes obesity.   OT comments  Pt tolerated EOB > 10 minutes this session but limited by BP decrease. Pt noted to have HGB 7.5 today in lab values. Pt incontinent of bowel on arrival and lack of awareness. Pt eager to participate despite symptoms and states "okay lets try it" pt enjoys sitting EOB and looking out the window . RECOMMEND Rn staff tilt table or EOB x4 per day outside of therapy to help with BP and progression toward goals. Question need to possible abdominal binder to help with BP.    Follow Up Recommendations  CIR;Supervision/Assistance - 24 hour    Equipment Recommendations  Other (comment)    Recommendations for Other Services      Precautions / Restrictions Precautions Precautions: Fall Precaution Comments: Watch BP; VitalGo tilt bed Required Braces or Orthoses: Other Brace Other Brace: RLE Bledsoe brace locked in extension when up- okay to unlock at EOB  Restrictions RLE Weight Bearing: Weight bearing as tolerated       Mobility Bed Mobility Overal bed mobility: Needs Assistance Bed Mobility: Supine to Sit;Rolling Rolling: Mod assist   Supine to sit: Mod assist     General bed mobility comments: pt is able to progress to EOB with moving BIL LE to EOB and requiers (A) to elevate core from surface while actively using R UE against bed surface  Transfers                      Balance Overall balance assessment:  Needs assistance Sitting-balance support: Feet supported(bil UE holding hand rails)         Standing balance-Leahy Scale: Zero                             ADL either performed or assessed with clinical judgement   ADL Overall ADL's : Needs assistance/impaired     Grooming: Wash/dry hands;Moderate assistance;Sitting Grooming Details (indicate cue type and reason): pt requires (A) to complete hygiene and face shaving with electric razor .  pt fatigued and decr BP at EOB      Lower Body Bathing: Total assistance Lower Body Bathing Details (indicate cue type and reason): incontience and lack of awareness Upper Body Dressing : Moderate assistance                     General ADL Comments: Pt completed EOB for ~15 minutes with decr BP and symptoms     Vision       Perception     Praxis      Cognition Arousal/Alertness: Awake/alert Behavior During Therapy: WFL for tasks assessed/performed Overall Cognitive Status: Within Functional Limits for tasks assessed                                          Exercises  Shoulder Instructions       General Comments BP with 20 point SBP drop noted    Pertinent Vitals/ Pain       Pain Assessment: No/denies pain  Home Living                                          Prior Functioning/Environment              Frequency  Min 3X/week        Progress Toward Goals  OT Goals(current goals can now be found in the care plan section)  Progress towards OT goals: Progressing toward goals  Acute Rehab OT Goals Patient Stated Goal: to sit up  OT Goal Formulation: With patient Time For Goal Achievement: 11/09/18 Potential to Achieve Goals: Good ADL Goals Pt Will Perform Grooming: with min guard assist;standing Pt Will Perform Upper Body Dressing: with set-up;with supervision;sitting Pt Will Perform Lower Body Dressing: with min guard assist;with adaptive equipment;sit  to/from stand Pt Will Transfer to Toilet: with min assist;ambulating;bedside commode Pt Will Perform Toileting - Clothing Manipulation and hygiene: with min guard assist;sitting/lateral leans;sit to/from stand;with adaptive equipment Pt/caregiver will Perform Home Exercise Program: Increased ROM;Increased strength;Both right and left upper extremity;With written HEP provided;Independently Additional ADL Goal #1: Pt will perform bed mobility with supervision in preparation for ADLs  Plan Discharge plan remains appropriate    Co-evaluation                 AM-PAC OT "6 Clicks" Daily Activity     Outcome Measure   Help from another person eating meals?: None Help from another person taking care of personal grooming?: A Little Help from another person toileting, which includes using toliet, bedpan, or urinal?: Total Help from another person bathing (including washing, rinsing, drying)?: A Lot Help from another person to put on and taking off regular upper body clothing?: A Little Help from another person to put on and taking off regular lower body clothing?: Total 6 Click Score: 14    End of Session Equipment Utilized During Treatment: Other (comment)(RA during session)  OT Visit Diagnosis: Unsteadiness on feet (R26.81);Other abnormalities of gait and mobility (R26.89);Muscle weakness (generalized) (M62.81);Pain Pain - part of body: Leg   Activity Tolerance Patient tolerated treatment well;Other (comment)(limited by symptoms of dizzines and decr BP)   Patient Left in bed;with call bell/phone within reach   Nurse Communication Mobility status;Precautions        Time: 8144-8185 OT Time Calculation (min): 45 min  Charges: OT General Charges $OT Visit: 1 Visit OT Treatments $Self Care/Home Management : 8-22 mins $Therapeutic Activity: 23-37 mins   Mateo Flow, OTR/L  Acute Rehabilitation Services Pager: [phone] Office: [phone] .    Mateo Flow 11/02/2018,  1:26 PM

## 2018-11-02 NOTE — Progress Notes (Addendum)
Physical Therapy Treatment Patient Details Name: Aaron [name] MRN: [id number] DOB: 08-20-1965 Today's Date: 11/02/2018    History of Present Illness Pt is a 53 y.o. male admitted 10/01/18 with progressive fluid retention, abdominal/BLE swelling. Worked up for anasarca and CHF with EF 15%. Transferred to [facility] for cardiac cath 3/13, found to have multivessel CAD; not a candidate for CABG. CT 3/19 with acute R thigh hematoma; planned PCI deferred secondary to this. Pt with positive gastric occult blood; KUB shows ileus, s/p NGT placement. PMH includes obesity.    PT Comments    Tolerated tilt to 30 deg <10 min and BP dropped to 77/63. Pt reported dizziness and was returned to supine. While upright, pt required cues to perform quad sets and glut sets.   Follow Up Recommendations  Supervision/Assistance - 24 hour;SNF     Equipment Recommendations  Other (comment)    Recommendations for Other Services       Precautions / Restrictions Precautions Precautions: Fall Precaution Comments: Watch BP; VitalGo tilt bed Required Braces or Orthoses: Other Brace Other Brace: RLE Bledsoe brace locked in extension when up- okay to unlock at EOB  Restrictions RLE Weight Bearing: Weight bearing as tolerated    Mobility  Bed Mobility Overal bed mobility: Needs Assistance Bed Mobility: Rolling Rolling: Mod assist;+2 for physical assistance   Supine to sit: Mod assist     General bed mobility comments: assistance x2 due to body length for rolling while cleaning BM  Transfers                    Ambulation/Gait                 Stairs             Wheelchair Mobility    Modified Rankin (Stroke Patients Only)       Balance Overall balance assessment: Needs assistance Sitting-balance support: Feet supported(bil UE holding hand rails)         Standing balance-Leahy Scale: Zero                              Cognition Arousal/Alertness:  Awake/alert Behavior During Therapy: WFL for tasks assessed/performed Overall Cognitive Status: Within Functional Limits for tasks assessed                                        Exercises      General Comments General comments (skin integrity, edema, etc.): BP with 20 point SBP drop noted      Pertinent Vitals/Pain Pain Assessment: No/denies pain    Home Living                      Prior Function            PT Goals (current goals can now be found in the care plan section) Acute Rehab PT Goals Patient Stated Goal: to sit up     Frequency    Min 3X/week      PT Plan Current plan remains appropriate    Co-evaluation              AM-PAC PT "6 Clicks" Mobility   Outcome Measure  Help needed turning from your back to your side while in a flat bed without using bedrails?: A Lot Help needed moving from lying on  your back to sitting on the side of a flat bed without using bedrails?: A Lot Help needed moving to and from a bed to a chair (including a wheelchair)?: Total Help needed standing up from a chair using your arms (e.g., wheelchair or bedside chair)?: Total Help needed to walk in hospital room?: Total Help needed climbing 3-5 steps with a railing? : Total 6 Click Score: 8    End of Session   Activity Tolerance: Treatment limited secondary to medical complications (Comment) Patient left: in bed;with call bell/phone within reach   PT Visit Diagnosis: Muscle weakness (generalized) (M62.81);Difficulty in walking, not elsewhere classified (R26.2);Pain     Time: 1500-1530 PT Time Calculation (min) (ACUTE ONLY): 30 min  Charges:  $Therapeutic Activity: 23-37 mins                     Army Fossa PT, DPT Acute Rehab [phone]

## 2018-11-02 NOTE — Progress Notes (Signed)
PROGRESS NOTE    Aaron [name]   PQA:[id number]  DOB: 28-Oct-1965  DOA: 10/01/2018 PCP: [facility] [place]   Brief Narrative:  [name] 53 y.o.malewith significant past medical history other than obesity who previously worked as a Radiation protection practitioner [facility] on March 5 with anasarca and 100 pound weight gain. In the ED patient was noted to be tachycardic, labs revealed AKI with BUN of 53, creatinine 1.4, his LFTs were slightly elevated in 200s and BNP was elevated at 2769. CT abdomen/pelvis showed diffuse anasarca, ascites and small bilateral pleural effusions.  Patient was admitted for further work-up of anasarca. Echocardiogram revealed EF of 15% with diffuse hypokinesis with left atrial moderately dilated, moderate MR and mild AR. Patient was managed with Coreg and Lasix IV/milrinone drip/Spironolactone.   Hospital course was complicated by A. fib with RVR requiring heparin drip.  Patient subsequently underwent cardiac cath on March 13 which revealed severe three-vessel CAD (Moderate mid LAD disease, tight distal lesion beyond reach of IMA. Severe disease in circumflex. RCA totally occluded).PCI of LAD/LCx versus CABG was considered but cardiothoracic surgery evaluated patient and did not feel he is candidate for CABG, recommended aggressive medical therapy.   On March 18 patient developed pain in his right thigh and noticed a hard swelling for which CT lower extremity was obtained revealing a hematoma.   Hospital course also complicated by upper GI bleed around the same time prompting GI evaluation- EGD on 3/22revealing small esophageal ulcers, gastritis.  Subsequently found to have an adynamic ileus/bowel obstruction. NG tube placed. Patient transferred back to [facility] due to ongoing multiple medical conditions.   Subjective: No complaints today.     Assessment & Plan:   Principal Problem:   Acute systolic CHF (congestive heart failure)  /   Anasarca - EF 10-15 % - has resolved - currently on daily Aldactone 25 mg and maintaining weight/ fluid balance - cont Losartan and Coreg   Active Problems:  Right thigh hematoma - has stabilized -  he needs PT     Acute blood loss anemia Upper GI bleed - EGD 3/22 > gastritis and LA grade C esophagitis- GI states it is safe to resume his anticoagulation - f/u with Dr Marca Ancona in 2-3 months please -1 U PRBC with a dose of Lasix given on 4/2 - f/u anemia panel and Hb tomorrow  Ileus - after GI bleed, EGD/ Colonoscopy - has resolved - NG removed 4/3- advance to solids 4/4  Hypokalemia - replace    A-fib/ NSVT - GI feels comfortable with resuming anticoagulation- hematoma has been stable as well- - resumed Eliquis on 4/3  - Amiodarone switched to oral on 4/3 200 BID - cont and Dig  Morbid obesity Body mass index is 36.82 kg/m. - needs to lose weight   Tobacco abuse - weaned nicotine patch from 21 to 14 mg  Dry nose with blood clots - remove O2- use saline nasal spray  Time spent in minutes: 35  DVT prophylaxis: Lovenox Code Status: full code Family Communication:  Disposition Plan: CIR when able to participate in 3 hrs of PT a day Consultants:   Cardiology  Cardiothoracic surgery  Orthopedic surgery  GI Procedures:   LHC on 10/09/2018: Severe three vessel disease  PCI/CABG considered, but pt is deemed a poor candidate. Medicall therapy only.  EGD 3/22 for GI bleed: small esophageal ulcers, gastritis. Antimicrobials:  Anti-infectives (From admission, onward)   Start     Dose/Rate Route  Frequency Ordered Stop   10/01/18 1515  cefTRIAXone (ROCEPHIN) 1 g in sodium chloride 0.9 % 100 mL IVPB     1 g 200 mL/hr over 30 Minutes Intravenous  Once 10/01/18 1502 10/01/18 2311   10/01/18 1515  azithromycin (ZITHROMAX) tablet 500 mg     500 mg Oral  Once 10/01/18 1502 10/01/18 1510       Objective: Vitals:   11/01/18 2002 11/02/18 0420 11/02/18 0955  11/02/18 1000  BP: 105/61 94/62 100/74   Pulse: 88 87 91 (!) 55  Resp: 17 (!) 23  17  Temp: 98.4 F (36.9 C) 98.4 F (36.9 C)    TempSrc: Oral Oral    SpO2: 93% 95%  99%  Weight:  (!) 137.2 kg    Height:        Intake/Output Summary (Last 24 hours) at 11/02/2018 1048 Last data filed at 11/02/2018 1000 Gross per 24 hour  Intake 680 ml  Output 1160 ml  Net -480 ml   Filed Weights   10/31/18 0500 11/01/18 0341 11/02/18 0420  Weight: 136.1 kg (!) 137.2 kg (!) 137.2 kg    Examination: General exam: Appears comfortable  HEENT: PERRLA, oral mucosa moist, no sclera icterus or thrush Respiratory system: Clear to auscultation. Respiratory effort normal. Cardiovascular system: S1 & S2 heard,  No murmurs  Gastrointestinal system: Abdomen soft, non-tender, nondistended. Normal bowel sounds   Central nervous system: Alert and oriented. No focal neurological deficits. Extremities: No cyanosis, clubbing or edema Skin: No rashes or ulcers Psychiatry:  Mood & affect appropriate.   Data Reviewed: I have personally reviewed following labs and imaging studies  CBC: Recent Labs  Lab 10/28/18 0433 10/29/18 0339 10/29/18 2222 10/31/18 0505 11/01/18 0348 11/02/18 0452  WBC 13.7* 13.0*  --  7.9 6.0 5.5  NEUTROABS  --  11.1*  --   --   --   --   HGB 7.7* 7.1* 8.0* 7.6* 7.6* 7.5*  HCT 24.5* 23.7* 25.8* 24.7* 24.6* 23.7*  MCV 92.5 92.9  --  90.8 90.8 89.1  PLT 202 213  --  259 263 290   Basic Metabolic Panel: Recent Labs  Lab 10/28/18 0433 10/29/18 0339 10/31/18 0505 11/01/18 0348 11/02/18 0452  NA 133* 131* 134* 133* 134*  K 3.9 3.4* 3.5 3.5 3.5  CL 100 101 101 100 99  CO2 25 27 26 27 27   GLUCOSE 154* 126* 108* 112* 105*  BUN 16 20 23* 26* 23*  CREATININE 0.94 0.90 0.89 0.87 0.88  CALCIUM 7.9* 7.5* 7.8* 7.8* 7.9*  MG 1.9  --   --   --   --    GFR: Estimated Creatinine Clearance: 145.2 mL/min (by C-G formula based on SCr of 0.88 mg/dL). Liver Function Tests: No results for  input(s): AST, ALT, ALKPHOS, BILITOT, PROT, ALBUMIN in the last 168 hours. No results for input(s): LIPASE, AMYLASE in the last 168 hours. No results for input(s): AMMONIA in the last 168 hours. Coagulation Profile: No results for input(s): INR, PROTIME in the last 168 hours. Cardiac Enzymes: No results for input(s): CKTOTAL, CKMB, CKMBINDEX, TROPONINI in the last 168 hours. BNP (last 3 results) No results for input(s): PROBNP in the last 8760 hours. HbA1C: No results for input(s): HGBA1C in the last 72 hours. CBG: No results for input(s): GLUCAP in the last 168 hours. Lipid Profile: No results for input(s): CHOL, HDL, LDLCALC, TRIG, CHOLHDL, LDLDIRECT in the last 72 hours. Thyroid Function Tests: No results for input(s): TSH, T4TOTAL,  FREET4, T3FREE, THYROIDAB in the last 72 hours. Anemia Panel: No results for input(s): VITAMINB12, FOLATE, FERRITIN, TIBC, IRON, RETICCTPCT in the last 72 hours. Urine analysis:    Component Value Date/Time   COLORURINE AMBER (A) 10/01/2018 1523   APPEARANCEUR HAZY (A) 10/01/2018 1523   LABSPEC 1.023 10/01/2018 1523   PHURINE 5.0 10/01/2018 1523   GLUCOSEU NEGATIVE 10/01/2018 1523   HGBUR SMALL (A) 10/01/2018 1523   BILIRUBINUR NEGATIVE 10/01/2018 1523   KETONESUR NEGATIVE 10/01/2018 1523   PROTEINUR NEGATIVE 10/01/2018 1523   NITRITE NEGATIVE 10/01/2018 1523   LEUKOCYTESUR NEGATIVE 10/01/2018 1523   Sepsis Labs: @LABRCNTIP (procalcitonin:4,lacticidven:4) )No results found for this or any previous visit (from the past 240 hour(s)).       Radiology Studies: No results found.    Scheduled Meds: . sodium chloride   Intravenous Once  . amiodarone  200 mg Oral BID  . apixaban  5 mg Oral BID  . aspirin EC  81 mg Oral Daily  . carvedilol  3.125 mg Oral BID WC  . digoxin  0.125 mg Oral Daily  . feeding supplement (ENSURE ENLIVE)  237 mL Oral TID BM  . losartan  25 mg Oral Daily  . mouth rinse  15 mL Mouth Rinse BID  . nicotine  14 mg  Transdermal Daily  . pantoprazole  40 mg Oral BID AC  . sodium chloride flush  10-40 mL Intracatheter Q12H  . sodium chloride flush  3 mL Intravenous Q12H  . spironolactone  25 mg Oral Daily   Continuous Infusions: . sodium chloride 250 mL (10/27/18 0837)     LOS: 32 days      Calvert Cantor, MD [facility] Pager: [email or web address] Password [facility] 11/02/2018, 10:48 AM

## 2018-11-02 NOTE — Progress Notes (Addendum)
Nutrition Follow-up RD working remotely.  DOCUMENTATION CODES:   Severe malnutrition in context of acute illness/injury, Obesity unspecified  INTERVENTION:   Continue Ensure Enlive po TID, each supplement provides 350 kcal and 20 grams of protein  NUTRITION DIAGNOSIS:   Severe Malnutrition related to acute illness as evidenced by percent weight loss, energy intake < or equal to 50% for > or equal to 5 days.  Ongoing  GOAL:   Patient will meet greater than or equal to 90% of their needs  Progressing  MONITOR:   PO intake, Supplement acceptance, Diet advancement, Weight trends, Labs, I & O's   ASSESSMENT:   53 yo male, admitted with acute systolic CHF, ileus. Limited PMH significant for morbid obesity.   During hospitalization found to have A fib with RVR. Cath revealed severe 3V-CAD. Developed a hematoma to R thigh on 3/18. EGD performed due to GI bleeding, found small esophageal ulcers and gastritis. Developed an adynamic ileus/bowel obstruction and required bowel rest and NGT to suction.  Patient did not answer phone when RD called today. Diet was advanced to regular on 4/4.  Patient has been drinking Ensure Enlive 2-3 times per day.   Plans for d/c to CIR when medically able.   NUTRITION - FOCUSED PHYSICAL EXAM:  Unable to complete  Diet Order:   Diet Order            Diet regular Room service appropriate? Yes; Fluid consistency: Thin  Diet effective now        Diet - low sodium heart healthy        Diet - low sodium heart healthy              EDUCATION NEEDS:   Education needs have been addressed  Skin:  Skin Assessment: Skin Integrity Issues: Skin Integrity Issues:: Other (Comment) Other: Ecchymosis - BL arms; MASD - groin, buttocks  Last BM:  3/29  Height:   Ht Readings from Last 1 Encounters:  10/18/18 6\' 4"  (1.93 m)    Weight:   Wt Readings from Last 1 Encounters:  11/02/18 (!) 137.2 kg    Ideal Body Weight:  91.8 kg  BMI:  Body  mass index is 36.82 kg/m.  Estimated Nutritional Needs:   Kcal:  2300-2760 (25-30 kcal/kg)  Protein:  110-138 gm (1.2-1.5 g/kg)  Fluid:  >/= 1.5 L daily or per MD    Joaquin Courts, RD, LDN, CNSC Pager [phone] After Hours Pager [phone]

## 2018-11-03 DIAGNOSIS — I951 Orthostatic hypotension: Secondary | ICD-10-CM

## 2018-11-03 DIAGNOSIS — D509 Iron deficiency anemia, unspecified: Secondary | ICD-10-CM

## 2018-11-03 LAB — CBC
HCT: 24.4 % — ABNORMAL LOW (ref 39.0–52.0)
Hemoglobin: 7.4 g/dL — ABNORMAL LOW (ref 13.0–17.0)
MCH: 27.5 pg (ref 26.0–34.0)
MCHC: 30.3 g/dL (ref 30.0–36.0)
MCV: 90.7 fL (ref 80.0–100.0)
Platelets: 278 10*3/uL (ref 150–400)
RBC: 2.69 MIL/uL — ABNORMAL LOW (ref 4.22–5.81)
RDW: 17.4 % — ABNORMAL HIGH (ref 11.5–15.5)
WBC: 5.4 10*3/uL (ref 4.0–10.5)
nRBC: 0 % (ref 0.0–0.2)

## 2018-11-03 LAB — VITAMIN B12: Vitamin B-12: 684 pg/mL (ref 180–914)

## 2018-11-03 MED ORDER — SODIUM CHLORIDE 0.9% IV SOLUTION: Freq: Once | INTRAVENOUS | Status: DC

## 2018-11-03 MED ORDER — SODIUM CHLORIDE 0.9 % IV SOLN
510.0000 mg | Freq: Once | INTRAVENOUS | Status: AC
  Administered 2018-11-03: 510 mg via INTRAVENOUS
  Filled 2018-11-03: qty 17

## 2018-11-03 MED ORDER — SODIUM CHLORIDE 0.9 % IV BOLUS
1000.0000 mL | Freq: Once | INTRAVENOUS | Status: AC
  Administered 2018-11-03: 1000 mL via INTRAVENOUS

## 2018-11-03 MED ORDER — FOLIC ACID 5 MG/ML IJ SOLN
1.0000 mg | Freq: Every day | INTRAMUSCULAR | Status: DC
  Administered 2018-11-03 – 2018-11-05 (×2): 1 mg via INTRAVENOUS
  Filled 2018-11-03 (×5): qty 0.2

## 2018-11-03 NOTE — Progress Notes (Signed)
Occupational Therapy Treatment Patient Details Name: Aaron [name] MRN: [id number] DOB: 1965-09-18 Today's Date: 11/03/2018    History of present illness Pt is a 53 y.o. male admitted 10/01/18 with progressive fluid retention, abdominal/BLE swelling. Worked up for anasarca and CHF with EF 15%. Transferred to [facility] for cardiac cath 3/13, found to have multivessel CAD; not a candidate for CABG. CT 3/19 with acute R thigh hematoma; planned PCI deferred secondary to this. Pt with positive gastric occult blood; KUB showed ileus. PMH includes obesity.   OT comments  Pt with BP obtained throughout session see chart below and symptomatic with diaphoretic response. HR sustain around 100 throughout session. Pt requires hoyer lift to progress to chair level this session and allowed first time out of the room with therapist into the hall. Pt expressed feeling good to be up in the chair. BP and symptoms continue to be greatest limiting factor for continued progression with therapy sessions and progression to CIR.    Orthostatic BPs Supine (bed flat) 98/69  Sitting 97/86  Sitting after 3 min 98/65  Post-maximove transfer to recliner 105/67  Partially reclined in recliner ~5 min 123/94     Follow Up Recommendations  CIR;Supervision/Assistance - 24 hour    Equipment Recommendations  Other (comment)(hoyer lift, bari wheelchair)    Recommendations for Other Services Rehab consult    Precautions / Restrictions Precautions Precautions: Fall Precaution Comments: Watch BP; VitalGo tilt bed Required Braces or Orthoses: Other Brace Other Brace: RLE Bledsoe brace locked in extension when up- okay to unlock at EOB        Mobility Bed Mobility Overal bed mobility: Needs Assistance Bed Mobility: Rolling Rolling: Max assist   Supine to sit: Mod assist;+2 for physical assistance Sit to supine: Mod assist;+2 for physical assistance   General bed mobility comments: ModA+2 to assist trunk elevation and BLEs to  EOB. MaxA to roll for maximove pad placement.  Transfers Overall transfer level: Needs assistance               General transfer comment: Unable to stand in [place] due to increased body habitus despite maxA+2. Tolerated transfer to recliner with maximove lift well. the vital go tilt bed is too high to attempt sliding transfer into chair     Balance Overall balance assessment: Needs assistance Sitting-balance support: Bilateral upper extremity supported Sitting balance-Leahy Scale: Fair                                     ADL either performed or assessed with clinical judgement   ADL Overall ADL's : Needs assistance/impaired Eating/Feeding: Independent;Sitting Eating/Feeding Details (indicate cue type and reason): chair position- hoyer lift to chair this session                                   General ADL Comments: Attempted sit<>stand from bed in stedy x2 but unable to clear hips     Vision       Perception     Praxis      Cognition Arousal/Alertness: Awake/alert Behavior During Therapy: WFL for tasks assessed/performed Overall Cognitive Status: Within Functional Limits for tasks assessed  Exercises General Exercises - Lower Extremity Heel Slides: AAROM;Both;Supine Hip ABduction/ADduction: AAROM;Both;Supine Toe Raises: AROM;Both;Seated Heel Raises: AROM;Both;Seated Other Exercises Other Exercises: pt able to sustain L knee flexion supine in the bed    Shoulder Instructions       General Comments BP obtain throughout session (see PT vital input) pt symptomatic and dizziness progressing with prolong upright posture. pt requires turn to supine due to progression of dizziness and diaphoretic     Pertinent Vitals/ Pain       Pain Assessment: No/denies pain Pain Intervention(s): Monitored during session  Home Living                                           Prior Functioning/Environment              Frequency  Min 3X/week        Progress Toward Goals  OT Goals(current goals can now be found in the care plan section)  Progress towards OT goals: Progressing toward goals  Acute Rehab OT Goals Patient Stated Goal: feels great to sit up OT Goal Formulation: With patient Time For Goal Achievement: 11/09/18 Potential to Achieve Goals: Good ADL Goals Pt Will Perform Grooming: with min guard assist;standing Pt Will Perform Upper Body Dressing: with set-up;with supervision;sitting Pt Will Perform Lower Body Dressing: with min guard assist;with adaptive equipment;sit to/from stand Pt Will Transfer to Toilet: with min assist;ambulating;bedside commode Pt Will Perform Toileting - Clothing Manipulation and hygiene: with min guard assist;sitting/lateral leans;sit to/from stand;with adaptive equipment Pt/caregiver will Perform Home Exercise Program: Increased ROM;Increased strength;Both right and left upper extremity;With written HEP provided;Independently Additional ADL Goal #1: Pt will perform bed mobility with supervision in preparation for ADLs  Plan Discharge plan remains appropriate    Co-evaluation    PT/OT/SLP Co-Evaluation/Treatment: Yes Reason for Co-Treatment: Complexity of the patient's impairments (multi-system involvement);For patient/therapist safety;To address functional/ADL transfers PT goals addressed during session: Mobility/safety with mobility OT goals addressed during session: ADL's and self-care;Proper use of Adaptive equipment and DME;Strengthening/ROM      AM-PAC OT "6 Clicks" Daily Activity     Outcome Measure   Help from another person eating meals?: None Help from another person taking care of personal grooming?: A Little Help from another person toileting, which includes using toliet, bedpan, or urinal?: Total Help from another person bathing (including washing, rinsing, drying)?: A Lot Help from  another person to put on and taking off regular upper body clothing?: A Little Help from another person to put on and taking off regular lower body clothing?: Total 6 Click Score: 14    End of Session Equipment Utilized During Treatment: Oxygen CPM Right Knee CPM Right Knee: Off Additional Comments: Seated in recliner  OT Visit Diagnosis: Unsteadiness on feet (R26.81);Other abnormalities of gait and mobility (R26.89);Muscle weakness (generalized) (M62.81);Pain   Activity Tolerance Other (comment)(BP and symptoms limiting progressiong)   Patient Left in chair;with call bell/phone within reach   Nurse Communication Mobility status;Precautions        Time: 6122-4497 OT Time Calculation (min): 51 min  Charges: OT General Charges $OT Visit: 1 Visit OT Treatments $Therapeutic Activity: 8-22 mins   Mateo Flow, OTR/L  Acute Rehabilitation Services Pager: [phone] Office: [phone] .    Mateo Flow 11/03/2018, 1:29 PM

## 2018-11-03 NOTE — Progress Notes (Addendum)
Occupational Therapy Treatment Patient Details Name: Aaron [name] MRN: [id number] DOB: [birth year] Today's Date: 11/03/2018    History of present illness Pt is a 53 y.o. male admitted 10/01/18 with progressive fluid retention, abdominal/BLE swelling. Worked up for anasarca and CHF with EF 15%. Transferred to [facility] for cardiac cath 3/13, found to have multivessel CAD; not a candidate for CABG. CT 3/19 with acute R thigh hematoma; planned PCI deferred secondary to this. Pt with positive gastric occult blood; KUB showed ileus. PMH includes obesity.   OT comments  Pt tolerated OOB > 2 hours and now requesting back to bed due to buttock discomfort in chair. Pt with request for bed pan demonstrating awareness to voiding needs. Pt enjoyed OOB and hopeful continued therapy to progress back home with wife.    Follow Up Recommendations  CIR;Supervision/Assistance - 24 hour    Equipment Recommendations  Other (comment)    Recommendations for Other Services Rehab consult    Precautions / Restrictions Precautions Precautions: Fall Precaution Comments: Watch BP; VitalGo tilt bed Required Braces or Orthoses: Other Brace Other Brace: RLE Bledsoe brace locked in extension when up- okay to unlock at EOB        Mobility Bed Mobility Overal bed mobility: Needs Assistance Bed Mobility: Rolling Rolling: Mod assist   Supine to sit: Mod assist;+2 for physical assistance Sit to supine: Mod assist;+2 for physical assistance   General bed mobility comments: pt reaching with UE toward bed rails to (A) this session  Transfers Overall transfer level: Needs assistance               General transfer comment: pt reports pressure on buttocks and need for back to bed at this time    Balance Overall balance assessment: Needs assistance Sitting-balance support: Bilateral upper extremity supported Sitting balance-Leahy Scale: Fair                                     ADL either performed or  assessed with clinical judgement   ADL Overall ADL's : Needs assistance/impaired Eating/Feeding: Independent;Sitting Eating/Feeding Details (indicate cue type and reason): chair position- hoyer lift to chair this session                                   General ADL Comments: pt in chair and hoyer lift back to bed for bed pan. pt two person (A) into lift pad and then rolling R and L to remove pad. Rn noting need for dressing on buttocks. pt requesting bed pan. pt positioned in chair position for bed     Vision       Perception     Praxis      Cognition Arousal/Alertness: Awake/alert Behavior During Therapy: WFL for tasks assessed/performed Overall Cognitive Status: Within Functional Limits for tasks assessed                                          Exercises General Exercises - Lower Extremity Heel Slides: AAROM;Both;Supine Hip ABduction/ADduction: AAROM;Both;Supine Toe Raises: AROM;Both;Seated Heel Raises: AROM;Both;Seated Other Exercises Other Exercises: pt able to sustain L knee flexion supine in the bed    Shoulder Instructions       General Comments BP obtain throughout session (see PT vital  input) pt symptomatic and dizziness progressing with prolong upright posture. pt requires turn to supine due to progression of dizziness and diaphoretic     Pertinent Vitals/ Pain       Pain Assessment: No/denies pain Pain Intervention(s): Monitored during session  Home Living                                          Prior Functioning/Environment              Frequency  Min 3X/week        Progress Toward Goals  OT Goals(current goals can now be found in the care plan section)  Progress towards OT goals: Progressing toward goals  Acute Rehab OT Goals Patient Stated Goal: to use bed pan OT Goal Formulation: With patient Time For Goal Achievement: 11/09/18 Potential to Achieve Goals: Good ADL Goals Pt Will  Perform Grooming: with min guard assist;standing Pt Will Perform Upper Body Dressing: with set-up;with supervision;sitting Pt Will Perform Lower Body Dressing: with min guard assist;with adaptive equipment;sit to/from stand Pt Will Transfer to Toilet: with min assist;ambulating;bedside commode Pt Will Perform Toileting - Clothing Manipulation and hygiene: with min guard assist;sitting/lateral leans;sit to/from stand;with adaptive equipment Pt/caregiver will Perform Home Exercise Program: Increased ROM;Increased strength;Both right and left upper extremity;With written HEP provided;Independently Additional ADL Goal #1: Pt will perform bed mobility with supervision in preparation for ADLs  Plan Discharge plan remains appropriate    Co-evaluation    PT/OT/SLP Co-Evaluation/Treatment: Yes Reason for Co-Treatment: Complexity of the patient's impairments (multi-system involvement);For patient/therapist safety;To address functional/ADL transfers PT goals addressed during session: Mobility/safety with mobility OT goals addressed during session: ADL's and self-care;Proper use of Adaptive equipment and DME;Strengthening/ROM      AM-PAC OT "6 Clicks" Daily Activity     Outcome Measure   Help from another person eating meals?: None Help from another person taking care of personal grooming?: A Little Help from another person toileting, which includes using toliet, bedpan, or urinal?: Total Help from another person bathing (including washing, rinsing, drying)?: A Lot Help from another person to put on and taking off regular upper body clothing?: A Little Help from another person to put on and taking off regular lower body clothing?: Total 6 Click Score: 14    End of Session Equipment Utilized During Treatment: Oxygen CPM Right Knee CPM Right Knee: Off Additional Comments: Seated in recliner  OT Visit Diagnosis: Unsteadiness on feet (R26.81);Other abnormalities of gait and mobility  (R26.89);Muscle weakness (generalized) (M62.81);Pain   Activity Tolerance Other (comment)(BP and symptoms limiting progressiong)   Patient Left with call bell/phone within reach;in bed;with nursing/sitter in room   Nurse Communication Mobility status;Precautions        Time: 8592-9244 OT Time Calculation (min): 8 min  Charges: OT General Charges $OT Visit: 1 Visit OT Treatments $Therapeutic Activity: 8-22 mins   Mateo Flow, OTR/L  Acute Rehabilitation Services Pager: [phone] Office: [phone] .    Mateo Flow 11/03/2018, 3:44 PM

## 2018-11-03 NOTE — Progress Notes (Signed)
PROGRESS NOTE    Aaron [name]   ZYY:[id number]  DOB: [birth year]  DOA: 10/01/2018 PCP: [facility] [place]   Brief Narrative:  [name] 53 y.o.malewith significant past medical history other than obesity who previously worked as a Radiation protection practitioner presented to this [facility] on March 5 with anasarca and 100 pound weight gain. In the ED patient was noted to be tachycardic, labs revealed AKI with BUN of 53, creatinine 1.4, his LFTs were slightly elevated in 200s and BNP was elevated at 2769. CT abdomen/pelvis showed diffuse anasarca, ascites and small bilateral pleural effusions.  Patient was admitted for further work-up of anasarca. Echocardiogram revealed EF of 15% with diffuse hypokinesis with left atrial moderately dilated, moderate MR and mild AR. Patient was managed with Coreg and Lasix IV/milrinone drip/Spironolactone.   Hospital course was complicated by A. fib with RVR requiring heparin drip.  Patient subsequently underwent cardiac cath on March 13 which revealed severe three-vessel CAD (Moderate mid LAD disease, tight distal lesion beyond reach of IMA. Severe disease in circumflex. RCA totally occluded).PCI of LAD/LCx versus CABG was considered but cardiothoracic surgery evaluated patient and did not feel he is candidate for CABG, recommended aggressive medical therapy.   On March 18 patient developed pain in his right thigh and noticed a hard swelling for which CT lower extremity was obtained revealing a hematoma.   Hospital course also complicated by upper GI bleed around the same time prompting GI evaluation- EGD on 3/22revealing small esophageal ulcers, gastritis.  Subsequently found to have an adynamic ileus/bowel obstruction. NG tube placed. Patient transferred back to [facility] due to ongoing multiple medical conditions.   Subjective: No complaints today. Loves the Ensure    Assessment & Plan:   Principal Problem:   Acute systolic CHF (congestive  heart failure) /   Anasarca - EF 10-15 % - has resolved - currently on daily Aldactone 25 mg and maintaining weight/ fluid balance, Losartan and Coreg   Active Problems:  Orthostatic hypotension - likely due to diuretics and anemia-  he is not bleeding any longer -  will also need to give a NS bolus  - based on anemia panel, he is deficient in Iron and Folic acid which I will replace IV today Addendum: after 1 L NS bolus, he is not longer orthostatic per vitals but still a little light headed when up likely due to deconditioning   Acute blood loss anemia Upper GI bleed - EGD 3/22 > gastritis and LA grade C esophagitis- GI states it is safe to resume his anticoagulation - f/u with Dr Marca Ancona in 2-3 months please -1 U PRBC with a dose of Lasix given on 4/2 - see above in regards to anemia panel and Iron   Spontaneous Right thigh hematoma 3/18 - has stabilized -  he needs PT    Ileus - after GI bleed, EGD/ Colonoscopy - has resolved - NG removed 4/3- advance to solids 4/4  Hypokalemia - replace    A-fib/ NSVT - GI feels comfortable with resuming anticoagulation- hematoma has been stable as well- - resumed Eliquis on 4/3  - Amiodarone switched to oral on 4/3 at a dose of 200 BID- typically should be tirated down in 1 - 2 wks - will need cards to f/u for this - cont Dig  Morbid obesity Body mass index is 36.09 kg/m. - needs to lose weight   Tobacco abuse - weaned nicotine patch from 21 to 14 mg  Dry nose with blood  clots - remove O2- use saline nasal spray  Time spent in minutes: 35  DVT prophylaxis: Lovenox Code Status: full code Family Communication:  Disposition Plan: CIR when able to participate in 3 hrs of PT a day Consultants:   Cardiology  Cardiothoracic surgery  Orthopedic surgery  GI Procedures:   LHC on 10/09/2018: Severe three vessel disease  PCI/CABG considered, but pt is deemed a poor candidate. Medicall therapy only.  EGD 3/22 for GI bleed:  small esophageal ulcers, gastritis. Antimicrobials:  Anti-infectives (From admission, onward)   Start     Dose/Rate Route Frequency Ordered Stop   10/01/18 1515  cefTRIAXone (ROCEPHIN) 1 g in sodium chloride 0.9 % 100 mL IVPB     1 g 200 mL/hr over 30 Minutes Intravenous  Once 10/01/18 1502 10/01/18 2311   10/01/18 1515  azithromycin (ZITHROMAX) tablet 500 mg     500 mg Oral  Once 10/01/18 1502 10/01/18 1510       Objective: Vitals:   11/03/18 0509 11/03/18 0513 11/03/18 0957 11/03/18 0958  BP: 100/68  112/75   Pulse: 92  98 96  Resp: (!) 23     Temp: (!) 97.4 F (36.3 C)     TempSrc: Oral     SpO2: 95%     Weight:  134.5 kg    Height:        Intake/Output Summary (Last 24 hours) at 11/03/2018 1006 Last data filed at 11/02/2018 2140 Gross per 24 hour  Intake 340 ml  Output -  Net 340 ml   Filed Weights   11/01/18 0341 11/02/18 0420 11/03/18 0513  Weight: (!) 137.2 kg (!) 137.2 kg 134.5 kg    Examination: General exam: Appears comfortable  HEENT: PERRLA, oral mucosa moist, no sclera icterus or thrush Respiratory system: Clear to auscultation. Respiratory effort normal. Cardiovascular system: S1 & S2 heard,  No murmurs  Gastrointestinal system: Abdomen soft, non-tender, nondistended. Normal bowel sounds   Central nervous system: Alert and oriented. No focal neurological deficits. Extremities: No cyanosis, clubbing or edema Skin: No rashes or ulcers Psychiatry:  Mood & affect appropriate.   Data Reviewed: I have personally reviewed following labs and imaging studies  CBC: Recent Labs  Lab 10/29/18 0339 10/29/18 2222 10/31/18 0505 11/01/18 0348 11/02/18 0452 11/03/18 0456  WBC 13.0*  --  7.9 6.0 5.5 5.4  NEUTROABS 11.1*  --   --   --   --   --   HGB 7.1* 8.0* 7.6* 7.6* 7.5* 7.4*  HCT 23.7* 25.8* 24.7* 24.6* 23.7* 24.4*  MCV 92.9  --  90.8 90.8 89.1 90.7  PLT 213  --  259 263 290 278   Basic Metabolic Panel: Recent Labs  Lab 10/28/18 0433 10/29/18 0339  10/31/18 0505 11/01/18 0348 11/02/18 0452  NA 133* 131* 134* 133* 134*  K 3.9 3.4* 3.5 3.5 3.5  CL 100 101 101 100 99  CO2 25 27 26 27 27   GLUCOSE 154* 126* 108* 112* 105*  BUN 16 20 23* 26* 23*  CREATININE 0.94 0.90 0.89 0.87 0.88  CALCIUM 7.9* 7.5* 7.8* 7.8* 7.9*  MG 1.9  --   --   --   --    GFR: Estimated Creatinine Clearance: 143.7 mL/min (by C-G formula based on SCr of 0.88 mg/dL). Liver Function Tests: No results for input(s): AST, ALT, ALKPHOS, BILITOT, PROT, ALBUMIN in the last 168 hours. No results for input(s): LIPASE, AMYLASE in the last 168 hours. No results for input(s): AMMONIA in  the last 168 hours. Coagulation Profile: No results for input(s): INR, PROTIME in the last 168 hours. Cardiac Enzymes: No results for input(s): CKTOTAL, CKMB, CKMBINDEX, TROPONINI in the last 168 hours. BNP (last 3 results) No results for input(s): PROBNP in the last 8760 hours. HbA1C: No results for input(s): HGBA1C in the last 72 hours. CBG: No results for input(s): GLUCAP in the last 168 hours. Lipid Profile: No results for input(s): CHOL, HDL, LDLCALC, TRIG, CHOLHDL, LDLDIRECT in the last 72 hours. Thyroid Function Tests: No results for input(s): TSH, T4TOTAL, FREET4, T3FREE, THYROIDAB in the last 72 hours. Anemia Panel: Recent Labs    11/02/18 1130 11/03/18 0456  VITAMINB12  --  684  FOLATE 4.2*  --   FERRITIN 542*  --   TIBC 174*  --   IRON 17*  --   RETICCTPCT 2.1  --    Urine analysis:    Component Value Date/Time   COLORURINE AMBER (A) 10/01/2018 1523   APPEARANCEUR HAZY (A) 10/01/2018 1523   LABSPEC 1.023 10/01/2018 1523   PHURINE 5.0 10/01/2018 1523   GLUCOSEU NEGATIVE 10/01/2018 1523   HGBUR SMALL (A) 10/01/2018 1523   BILIRUBINUR NEGATIVE 10/01/2018 1523   KETONESUR NEGATIVE 10/01/2018 1523   PROTEINUR NEGATIVE 10/01/2018 1523   NITRITE NEGATIVE 10/01/2018 1523   LEUKOCYTESUR NEGATIVE 10/01/2018 1523   Sepsis Labs:  @[email or web address] (procalcitonin:4,lacticidven:4) )No results found for this or any previous visit (from the past 240 hour(s)).       Radiology Studies: No results found.    Scheduled Meds: . sodium chloride   Intravenous Once  . sodium chloride   Intravenous Once  . amiodarone  200 mg Oral BID  . apixaban  5 mg Oral BID  . aspirin EC  81 mg Oral Daily  . carvedilol  3.125 mg Oral BID WC  . digoxin  0.125 mg Oral Daily  . feeding supplement (ENSURE ENLIVE)  237 mL Oral TID BM  . folic acid  1 mg Intravenous Daily  . losartan  25 mg Oral Daily  . mouth rinse  15 mL Mouth Rinse BID  . nicotine  14 mg Transdermal Daily  . pantoprazole  40 mg Oral BID AC  . rosuvastatin  20 mg Oral q1800  . sodium chloride flush  10-40 mL Intracatheter Q12H  . sodium chloride flush  3 mL Intravenous Q12H   Continuous Infusions: . sodium chloride 250 mL (10/27/18 0837)  . ferumoxytol    . sodium chloride       LOS: 33 days      Calvert Cantor, MD [facility] Pager: [email or web address] Password [email or web address] 11/03/2018, 10:06 AM

## 2018-11-03 NOTE — Progress Notes (Signed)
Inpatient Rehab Admissions Coordinator:    I met with patient at the bedside.  Note he has been able to increase his tolerance with therapy today.  I have spoken with Dr. Posey Pronto and he would like to watch patient overnight.  If he remains stable, I could potentially admit him tomorrow.  I have let Dr. Wynelle Cleveland and Hassan Rowan, CM, know.    Shann Medal, PT, DPT Admissions Coordinator [phone] 11/03/18  4:08 PM

## 2018-11-03 NOTE — Progress Notes (Signed)
OT NOTE  OT holding until after bolus and reattempt treatment later this afternoon.   Mateo Flow, OTR/L  Acute Rehabilitation Services Pager: [phone] Office: [phone] .

## 2018-11-03 NOTE — TOC Progression Note (Addendum)
Transition of Care [facility]) - Progression Note    Patient Details  Name: Aaron [name] MRN: [id number] Date of Birth: 1966/02/12  [facility] CM/SW Contact  Graves-Bigelow, Lamar Laundry, RN Phone Number: 11/03/2018, 4:01 PM  Clinical Narrative:  CIR continues to follow the patient. Admissions coordinator to see how patient does overnight and hopefully will be able to accept in the am. If patient is not approved for CIR- patient was previously set up with [facility] from a previous Case Manager. CM will continue to monitor for additional transition care of needs.     Barriers to Discharge: Other (comment)(CIR looking at patient- wants to see if patient has tolerance for CIR)      Readmission Risk Interventions No flowsheet data found.

## 2018-11-03 NOTE — TOC Progression Note (Signed)
Transition of Care [facility]) - Progression Note    Patient Details  Name: Reubin Mcelhenney MRN: [id number] Date of Birth: 1965-10-15  Transition of Care [facility]) CM/SW Contact  Abigail Butts, LCSW Phone Number: 11/03/2018, 12:34 PM  Clinical Narrative: TOC following for disposition needs. Have discussed possibility of LOG for SNF with CSW Chiropodist, but LOG is not approved at this time. Will need to determine patient's eligibility for Medicaid, given patient and wife's assets.   Did speak with patient's wife who indicated Medicaid and SS disability applications were mailed 07/29/18. Financial counseling department also following.   TOC to follow for patient's progress.     Expected Discharge Plan: Home w Home Health Services Barriers to Discharge: Other (comment)(CIR looking at patient- wants to see if patient has tolerance for CIR)  Expected Discharge Plan and Services Expected Discharge Plan: Home w Home Health Services In-house Referral: Clinical Social Work Discharge Planning Services: CM Consult Post Acute Care Choice: Home Health Living arrangements for the past 2 months: Single Family Home Expected Discharge Date: 10/30/18                         Social Determinants of Health (SDOH) Interventions    Readmission Risk Interventions No flowsheet data found.

## 2018-11-03 NOTE — Progress Notes (Signed)
Physical Therapy Treatment Patient Details Name: Aaron [name] MRN: [id number] DOB: 09-25-1965 Today's Date: 11/03/2018    History of Present Illness Pt is a 53 y.o. male admitted 10/01/18 with progressive fluid retention, abdominal/BLE swelling. Worked up for anasarca and CHF with EF 15%. Transferred to [facility] for cardiac cath 3/13, found to have multivessel CAD; not a candidate for CABG. CT 3/19 with acute R thigh hematoma; planned PCI deferred secondary to this. Pt with positive gastric occult blood; KUB showed ileus. PMH includes obesity.   PT Comments    Pt initially seen with low BP (80s/60s), therefore RN gave fluids with PT/OT to attempt orthostatics after this. Pt able to sit EOB, but unable to stand with Stedy and maxA+2. Transferred to recliner with maximove lift which pt tolerated well. BP remained more stable this session, although pt symptomatic with transfers, c/o dizziness and becoming diaphoretic (see BP values below). Very happy to be sitting in recliner. Recommend position changes every 1-2 hours, RN and pt educated.  Orthostatic BPs Supine (bed flat) 98/69  Sitting 97/86  Sitting after 3 min 98/65  Post-maximove transfer to recliner 105/67  Partially reclined in recliner ~5 min 123/94     Follow Up Recommendations  CIR;Supervision/Assistance - 24 hour     Equipment Recommendations  Other (comment)(TBD)    Recommendations for Other Services       Precautions / Restrictions Precautions Precautions: Fall Precaution Comments: Watch BP; VitalGo tilt bed Required Braces or Orthoses: Other Brace Other Brace: RLE Bledsoe brace locked in extension when up- okay to unlock at EOB     Mobility  Bed Mobility Overal bed mobility: Needs Assistance Bed Mobility: Rolling Rolling: Max assist   Supine to sit: Mod assist;+2 for physical assistance Sit to supine: Mod assist;+2 for physical assistance   General bed mobility comments: ModA+2 to assist trunk elevation and BLEs to  EOB. MaxA to roll for maximove pad placement.  Transfers Overall transfer level: Needs assistance               General transfer comment: Unable to stand in [place] due to increased body habitus despite maxA+2. Tolerated transfer to recliner with maximove lift well  Ambulation/Gait                 Stairs             Wheelchair Mobility    Modified Rankin (Stroke Patients Only)       Balance Overall balance assessment: Needs assistance Sitting-balance support: Bilateral upper extremity supported Sitting balance-Leahy Scale: Fair                                      Cognition Arousal/Alertness: Awake/alert Behavior During Therapy: WFL for tasks assessed/performed Overall Cognitive Status: Within Functional Limits for tasks assessed                                        Exercises General Exercises - Lower Extremity Heel Slides: AAROM;Both;Supine Hip ABduction/ADduction: AAROM;Both;Supine Toe Raises: AROM;Both;Seated Heel Raises: AROM;Both;Seated    General Comments        Pertinent Vitals/Pain Pain Assessment: No/denies pain Pain Intervention(s): Monitored during session    Home Living  Prior Function            PT Goals (current goals can now be found in the care plan section) Acute Rehab PT Goals Patient Stated Goal: to sit up  PT Goal Formulation: With patient Time For Goal Achievement: 11/17/18 Potential to Achieve Goals: Good Progress towards PT goals: Progressing toward goals    Frequency    Min 3X/week      PT Plan Discharge plan needs to be updated    Co-evaluation PT/OT/SLP Co-Evaluation/Treatment: Yes Reason for Co-Treatment: Complexity of the patient's impairments (multi-system involvement);For patient/therapist safety;To address functional/ADL transfers PT goals addressed during session: Mobility/safety with mobility        AM-PAC PT "6 Clicks"  Mobility   Outcome Measure  Help needed turning from your back to your side while in a flat bed without using bedrails?: A Lot Help needed moving from lying on your back to sitting on the side of a flat bed without using bedrails?: A Lot Help needed moving to and from a bed to a chair (including a wheelchair)?: Total Help needed standing up from a chair using your arms (e.g., wheelchair or bedside chair)?: Total Help needed to walk in hospital room?: Total Help needed climbing 3-5 steps with a railing? : Total 6 Click Score: 8    End of Session Equipment Utilized During Treatment: Gait belt;Oxygen Activity Tolerance: Patient tolerated treatment well Patient left: in chair;with call bell/phone within reach Nurse Communication: Mobility status;Need for lift equipment PT Visit Diagnosis: Muscle weakness (generalized) (M62.81);Difficulty in walking, not elsewhere classified (R26.2);Pain     Time: 1141-1230 PT Time Calculation (min) (ACUTE ONLY): 49 min  Charges:  $Therapeutic Activity: 23-37 mins                    Ina Homes, PT, DPT Acute Rehabilitation Services  Pager [phone] Office [phone]  Malachy Chamber 11/03/2018, 1:13 PM

## 2018-11-04 DIAGNOSIS — K7200 Acute and subacute hepatic failure without coma: Secondary | ICD-10-CM

## 2018-11-04 DIAGNOSIS — D62 Acute posthemorrhagic anemia: Secondary | ICD-10-CM

## 2018-11-04 DIAGNOSIS — L8990 Pressure ulcer of unspecified site, unspecified stage: Secondary | ICD-10-CM

## 2018-11-04 LAB — CBC
HCT: 25.4 % — ABNORMAL LOW (ref 39.0–52.0)
Hemoglobin: 7.7 g/dL — ABNORMAL LOW (ref 13.0–17.0)
MCH: 27.2 pg (ref 26.0–34.0)
MCHC: 30.3 g/dL (ref 30.0–36.0)
MCV: 89.8 fL (ref 80.0–100.0)
Platelets: 329 10*3/uL (ref 150–400)
RBC: 2.83 MIL/uL — ABNORMAL LOW (ref 4.22–5.81)
RDW: 17.5 % — ABNORMAL HIGH (ref 11.5–15.5)
WBC: 7.4 10*3/uL (ref 4.0–10.5)
nRBC: 0 % (ref 0.0–0.2)

## 2018-11-04 MED ORDER — ALTEPLASE 2 MG IJ SOLR
2.0000 mg | Freq: Once | INTRAMUSCULAR | Status: AC
  Administered 2018-11-04: 2 mg
  Filled 2018-11-04: qty 2

## 2018-11-04 MED ORDER — FUROSEMIDE 40 MG PO TABS
40.0000 mg | ORAL_TABLET | Freq: Every day | ORAL | Status: DC
  Administered 2018-11-04 – 2018-11-05 (×2): 40 mg via ORAL
  Filled 2018-11-04 (×2): qty 1

## 2018-11-04 NOTE — Progress Notes (Signed)
PROGRESS NOTE    Aaron [name]  NLZ:[id number] DOB: [birth year] DOA: 10/01/2018 PCP: [facility] [place]    Brief Narrative:  53 y.o.malewith significant past medical history other than obesity who previously worked as a Radiation protection practitioner [facility] on March 5 with anasarca and 100 pound weight gain. In the ED patient was noted to be tachycardic, labs revealed AKI with BUN of 53, creatinine 1.4, his LFTs were slightly elevated in 200s and BNP was elevated at 2769. CT abdomen/pelvis showed diffuse anasarca, ascites and small bilateral pleural effusions.  Patient was admitted for further work-up of anasarca. Echocardiogram revealed EF of 15% with diffuse hypokinesis with left atrial moderately dilated, moderate MR and mild AR. Patient was managed with Coreg and Lasix IV/milrinone drip/Spironolactone.   Hospital course was complicated by A. fib with RVR requiring heparin drip.  Patient subsequently underwent cardiac cath on March 13 which revealed severe three-vessel CAD (Moderate mid LAD disease, tight distal lesion beyond reach of IMA. Severe disease in circumflex. RCA totally occluded).PCI of LAD/LCx versus CABG was considered but cardiothoracic surgery evaluated patient and did not feel he is candidate for CABG, recommended aggressive medical therapy.   On March 18 patient developed pain in his right thigh and noticed a hard swelling for which CT lower extremity was obtained revealing a hematoma.   Hospital course also complicated by upper GI bleed around the same time prompting GI evaluation- EGD on 3/22revealing small esophageal ulcers, gastritis.  Subsequently found to have an adynamic ileus/bowel obstruction. NG tube placed. Patient transferred back to [facility] due to ongoing multiple medical conditions.   Assessment & Plan:   Principal Problem:   Acute systolic CHF (congestive heart failure) (HCC) Active Problems:   Anasarca   AKI (acute  kidney injury) (HCC)   Acute hypoxemic respiratory failure (HCC)   Morbid obesity (HCC)   Atrial fibrillation with rapid ventricular response (HCC)   Hypokalemia   Protein-calorie malnutrition, severe   Hematoma of right thigh   Acute blood loss anemia   Tobacco abuse   Gastritis with bleeding   Esophagitis, [place] grade C   Iron deficiency anemia   Orthostatic hypotension  Principal Problem:   Acute systolic CHF (congestive heart failure) /   Anasarca - EF 10-15 % - Continued on daily Aldactone 25 mg and maintaining weight/ fluid balance, Losartan and Coreg - down 3kg over previous 24hrs - Suspect component of current debility is related to underlying heart failure  Active Problems: Orthostatic hypotension - likely due to diuretics and anemia - No signs of acute blood loss at this time - Given IV iron for iron deficiency - Yesterday given 1L bolus for orthostatic hypotension with BP improved -Today, BP more elevated. Will need to be conservative with volume given severely diminished EF -Orthostatics from 4/7 reviewed. Pt not orthostatic by vital signs. Suspect dizziness/decreased tolerance secondary to deconditioning secondary to reduced EF  Acute blood loss anemia Upper GI bleed - EGD 3/22 > gastritis and LA grade C esophagitis- GI states it is safe to resume his anticoagulation - Recommendation to f/u with Dr Marca Ancona in 2-3 months please -1 U PRBC with a dose of Lasix given on 4/2 - see above in regards to anemia panel and Iron -hemodynamically stable at this time  Spontaneous Right thigh hematoma 3/18 - has stabilized -  Continue with PT as tolerated  Ileus - after GI bleed, EGD/ Colonoscopy - has resolved - NG removed 4/3- advance to solids  4/4 -Presently stable  Hypokalemia - replace    A-fib/ NSVT - GI feels comfortable with resuming anticoagulation- hematoma has been stable as well- - resumed Eliquis on 4/3  - Amiodarone switched to oral on 4/3  at a dose of 200 BID- typically should be tirated down in 1 - 2 wks - will need cards to f/u for this - cont Dig as tolerated  Morbid obesity Body mass index is 36.09 kg/m. - recommend diet/lifestyle modification   Tobacco abuse - weaned nicotine patch from 21 to 14 mg - Stable at this time  Dry nose with blood clots - remove O2- use saline nasal spray - Stable at present  DVT prophylaxis: SCD's Code Status: Full Family Communication: Pt in room, family not at bedside Disposition Plan: CIR pending  Consultants:   Cardiology  Cardiothoracic surgery  Orthopedic surgery  GI  Procedures:   [facility] Severe three vessel disease  PCI/CABG considered, but pt is deemed a poor candidate. Medicalltherapy only.  EGD 3/22 for GI bleed: small esophageal ulcers, gastritis.  Antimicrobials: Anti-infectives (From admission, onward)   Start     Dose/Rate Route Frequency Ordered Stop   10/01/18 1515  cefTRIAXone (ROCEPHIN) 1 g in sodium chloride 0.9 % 100 mL IVPB     1 g 200 mL/hr over 30 Minutes Intravenous  Once 10/01/18 1502 10/01/18 2311   10/01/18 1515  azithromycin (ZITHROMAX) tablet 500 mg     500 mg Oral  Once 10/01/18 1502 10/01/18 1510       Subjective: Denies sob at this time  Objective: Vitals:   11/03/18 1924 11/03/18 2350 11/04/18 0443 11/04/18 0817  BP: 100/66 105/70 111/67 103/68  Pulse: (!) 56 95 98 (!) 101  Resp:      Temp: 98.3 F (36.8 C) 98.8 F (37.1 C) 98.1 F (36.7 C) (!) 97.4 F (36.3 C)  TempSrc: Oral Oral Oral Axillary  SpO2: 97% 97% 97% 94%  Weight:   (!) 138.6 kg   Height:        Intake/Output Summary (Last 24 hours) at 11/04/2018 1133 Last data filed at 11/03/2018 1526 Gross per 24 hour  Intake 237 ml  Output 200 ml  Net 37 ml   Filed Weights   11/02/18 0420 11/03/18 0513 11/04/18 0443  Weight: (!) 137.2 kg 134.5 kg (!) 138.6 kg    Examination:  General exam: Appears calm and comfortable  Respiratory system:  Clear to auscultation. Respiratory effort normal. Cardiovascular system: S1 & S2 heard, RRR Gastrointestinal system: Abdomen is nondistended, soft and nontender. No organomegaly or masses felt. Morbidly obese Central nervous system: Alert and oriented. No focal neurological deficits. Extremities: Symmetric 5 x 5 power. BLE edema, unaboots in place Skin: No rashes, lesions  Psychiatry: Judgement and insight appear normal. Mood & affect appropriate.   Data Reviewed: I have personally reviewed following labs and imaging studies  CBC: Recent Labs  Lab 10/29/18 0339 10/29/18 2222 10/31/18 0505 11/01/18 0348 11/02/18 0452 11/03/18 0456  WBC 13.0*  --  7.9 6.0 5.5 5.4  NEUTROABS 11.1*  --   --   --   --   --   HGB 7.1* 8.0* 7.6* 7.6* 7.5* 7.4*  HCT 23.7* 25.8* 24.7* 24.6* 23.7* 24.4*  MCV 92.9  --  90.8 90.8 89.1 90.7  PLT 213  --  259 263 290 278   Basic Metabolic Panel: Recent Labs  Lab 10/29/18 0339 10/31/18 0505 11/01/18 0348 11/02/18 0452  NA 131* 134* 133* 134*  K 3.4* 3.5 3.5 3.5  CL 101 101 100 99  CO2 27 26 27 27   GLUCOSE 126* 108* 112* 105*  BUN 20 23* 26* 23*  CREATININE 0.90 0.89 0.87 0.88  CALCIUM 7.5* 7.8* 7.8* 7.9*   GFR: Estimated Creatinine Clearance: 145.9 mL/min (by C-G formula based on SCr of 0.88 mg/dL). Liver Function Tests: No results for input(s): AST, ALT, ALKPHOS, BILITOT, PROT, ALBUMIN in the last 168 hours. No results for input(s): LIPASE, AMYLASE in the last 168 hours. No results for input(s): AMMONIA in the last 168 hours. Coagulation Profile: No results for input(s): INR, PROTIME in the last 168 hours. Cardiac Enzymes: No results for input(s): CKTOTAL, CKMB, CKMBINDEX, TROPONINI in the last 168 hours. BNP (last 3 results) No results for input(s): PROBNP in the last 8760 hours. HbA1C: No results for input(s): HGBA1C in the last 72 hours. CBG: No results for input(s): GLUCAP in the last 168 hours. Lipid Profile: No results for input(s):  CHOL, HDL, LDLCALC, TRIG, CHOLHDL, LDLDIRECT in the last 72 hours. Thyroid Function Tests: No results for input(s): TSH, T4TOTAL, FREET4, T3FREE, THYROIDAB in the last 72 hours. Anemia Panel: Recent Labs    11/02/18 1130 11/03/18 0456  VITAMINB12  --  684  FOLATE 4.2*  --   FERRITIN 542*  --   TIBC 174*  --   IRON 17*  --   RETICCTPCT 2.1  --    Sepsis Labs: No results for input(s): PROCALCITON, LATICACIDVEN in the last 168 hours.  No results found for this or any previous visit (from the past 240 hour(s)).   Radiology Studies: No results found.  Scheduled Meds: . sodium chloride   Intravenous Once  . sodium chloride   Intravenous Once  . amiodarone  200 mg Oral BID  . apixaban  5 mg Oral BID  . aspirin EC  81 mg Oral Daily  . carvedilol  3.125 mg Oral BID WC  . digoxin  0.125 mg Oral Daily  . feeding supplement (ENSURE ENLIVE)  237 mL Oral TID BM  . folic acid  1 mg Intravenous Daily  . losartan  25 mg Oral Daily  . mouth rinse  15 mL Mouth Rinse BID  . nicotine  14 mg Transdermal Daily  . pantoprazole  40 mg Oral BID AC  . rosuvastatin  20 mg Oral q1800  . sodium chloride flush  10-40 mL Intracatheter Q12H  . sodium chloride flush  3 mL Intravenous Q12H   Continuous Infusions: . sodium chloride 20 mL (11/03/18 1346)     LOS: 34 days   Rickey Barbara, MD [facility] Pager On Amion  If 7PM-7AM, please contact night-coverage 11/04/2018, 11:33 AM

## 2018-11-04 NOTE — Progress Notes (Signed)
Inpatient Rehab Admissions Coordinator:   Noted pt hypertensive with therapy this AM.  Will continue to follow for medical readiness for a possible rehab admission later this week.    Estill Dooms, PT, DPT Admissions Coordinator [phone] 11/04/18  1:22 PM

## 2018-11-04 NOTE — PMR Pre-admission (Signed)
PMR Admission Coordinator Pre-Admission Assessment  Patient: Aaron [name] is an 53 y.o., male MRN: [id number] DOB: Jun 06, 1966 Height:  (193 cm) Weight: 134.3 kg              Insurance Information HMO:     PPO:      PCP:      IPA:      80/20:      OTHER:  PRIMARY: Uninsured    Medicaid Application Date: 10/16/2018      Case Manager: Amanda Cockayne - [email or web address] - [phone] Disability Application Date:       Case Worker:   The "Data Collection Information Summary" for patients in Inpatient Rehabilitation Facilities with attached "Privacy Act Statement-Health Care Records" was provided and verbally reviewed with: N/A  Emergency Contact Information Contact Information    Name Relation Home Work Mobile   [facility] Spouse [phone]     [place], [place] [phone]       Current Medical History  Patient Admitting Diagnosis: Debility  History of Present Illness: Aaron [name] is a 53 y.o. male who was admitted to outside hospital on 10/01/2018 with 3-4 weeks with abdominal pain, peripheral edema and SOB.  He was noted to have anasarca with abnormal LFTs,  AKI and RLL effusion with concerns of PNA due to leucocytosis. Abdominal ultrasound revealed cholelithiasis with fatty liver and liver doppler negative for thrombosis. 2 D echo done revealing diffuse hypokinesis with ejection fraction of 15%.  Dr. Cristal Deer consulted for input and and felt that AKI/ elevated LFTs likely due to congestion. He underwent cardiac cath on 10/09/2018 by Dr. 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  Fluid overload improving with medication adjustment and milrinone on board.  He developed A fib with RVR and placed on IV amiodarone for rate control.  Patient not felt to be a candidate for CABG per Dr. Dorris Fetch and being treated medically. Plans for PCI of LAD/LCx were cancelled following large hematoma to R thigh on heparin therapy.   Heparin discontinued and hematoma improving.  Hospital course complicated by ileus, heart rate control, hypotension, and hematoma.  Therapy ongoing and patient limited by BP and weakness.  Recommendations for CIR were made.     Glasgow Coma Scale Score: 15  Past Medical History  Past Medical History:  Diagnosis Date  . Morbid obesity (HCC)     Family History  family history includes Alcoholism in his father; Diabetes in his mother; Drug abuse in his brother; Rheumatic fever in his mother.  Prior Rehab/Hospitalizations:  Has the patient had prior rehab or hospitalizations prior to admission? No  Has the patient had major surgery during 100 days prior to admission? No  Current Medications   Current Facility-Administered Medications:  .  0.9 %  sodium chloride infusion (Manually program via Guardrails IV Fluids), , Intravenous, Once, Rizwan, Saima, MD .  0.9 %  sodium chloride infusion (Manually program via Guardrails IV Fluids), , Intravenous, Once, Rizwan, Saima, MD .  0.9 %  sodium chloride infusion, 250 mL, Intravenous, PRN, Kerin Salen, MD, Last Rate: 10 mL/hr at 11/03/18 1346, 20 mL at 11/03/18 1346 .  acetaminophen (TYLENOL) tablet 650 mg, 650 mg, Oral, Q4H PRN, Kerin Salen, MD .  alum & mag hydroxide-simeth (MAALOX/MYLANTA) 200-200-20 MG/5ML suspension 30 mL, 30 mL, Oral, Q4H PRN, Kerin Salen, MD, 30 mL at 10/16/18 0552 .  amiodarone (PACERONE) tablet 200 mg, 200 mg, Oral, BID, Rizwan, Saima, MD, 200 mg at 11/05/18  [id number] .  apixaban (ELIQUIS) tablet 5 mg, 5 mg, Oral, BID, Earnie LarssonWilson, Frank R, RPH, 5 mg at 11/05/18 [id number] .  aspirin EC tablet 81 mg, 81 mg, Oral, Daily, Bensimhon, Bevelyn Bucklesaniel R, MD, 81 mg at 11/05/18 [id number] .  carvedilol (COREG) tablet 3.125 mg, 3.125 mg, Oral, BID WC, Bensimhon, Bevelyn Bucklesaniel R, MD, 3.125 mg at 11/05/18 0834 .  digoxin (LANOXIN) tablet 0.125 mg, 0.125 mg, Oral, Daily, Bensimhon, Bevelyn Bucklesaniel R, MD, 0.125 mg at 11/05/18 [id number] .  feeding supplement (ENSURE ENLIVE) (ENSURE  ENLIVE) liquid 237 mL, 237 mL, Oral, TID BM, Noralee Stainhoi, Jennifer, DO, 237 mL at 11/05/18 1303 .  folic acid injection 1 mg, 1 mg, Intravenous, Daily, Rizwan, Saima, MD, 1 mg at 11/05/18 0834 .  furosemide (LASIX) tablet 40 mg, 40 mg, Oral, Daily, Jerald Kiefhiu, Stephen K, MD, 40 mg at 11/05/18 [id number] .  losartan (COZAAR) tablet 25 mg, 25 mg, Oral, Daily, Bensimhon, Bevelyn Bucklesaniel R, MD, 25 mg at 11/05/18 0834 .  MEDLINE mouth rinse, 15 mL, Mouth Rinse, BID, Kerin SalenKarki, Arya, MD, 15 mL at 11/05/18 0835 .  nicotine (NICODERM CQ - dosed in mg/24 hours) patch 14 mg, 14 mg, Transdermal, Daily, Rizwan, Saima, MD, 14 mg at 11/05/18 0835 .  ondansetron (ZOFRAN) injection 4 mg, 4 mg, Intravenous, Q6H PRN, Kerin SalenKarki, Arya, MD, 4 mg at 10/27/18 1547 .  pantoprazole (PROTONIX) EC tablet 40 mg, 40 mg, Oral, BID AC, Rizwan, Saima, MD, 40 mg at 11/05/18 0610 .  polyethylene glycol (MIRALAX / GLYCOLAX) packet 17 g, 17 g, Oral, Daily PRN, Rizwan, Saima, MD .  rosuvastatin (CRESTOR) tablet 20 mg, 20 mg, Oral, q1800, Calvert Cantorizwan, Saima, MD, 20 mg at 11/04/18 2034 .  sodium chloride (OCEAN) 0.65 % nasal spray 1 spray, 1 spray, Each Nare, PRN, Calvert Cantorizwan, Saima, MD, 1 spray at 11/01/18 1051 .  sodium chloride flush (NS) 0.9 % injection 10-40 mL, 10-40 mL, Intracatheter, Q12H, Kerin SalenKarki, Arya, MD, 10 mL at 11/05/18 0836 .  sodium chloride flush (NS) 0.9 % injection 10-40 mL, 10-40 mL, Intracatheter, PRN, Kerin SalenKarki, Arya, MD, 10 mL at 11/04/18 1520 .  sodium chloride flush (NS) 0.9 % injection 3 mL, 3 mL, Intravenous, Q12H, Kerin SalenKarki, Arya, MD, 3 mL at 11/05/18 0836 .  sodium chloride flush (NS) 0.9 % injection 3 mL, 3 mL, Intravenous, PRN, Kerin SalenKarki, Arya, MD .  sodium phosphate (FLEET) 7-19 GM/118ML enema 1 enema, 1 enema, Rectal, Daily PRN, Calvert Cantorizwan, Saima, MD .  Weyman Croonwitch hazel-glycerin (TUCKS) pad, , Topical, PRN, Kerin SalenKarki, Arya, MD  Patients Current Diet:  Diet Order            Diet regular Room service appropriate? Yes; Fluid consistency: Thin  Diet effective now        Diet  - low sodium heart healthy        Diet - low sodium heart healthy              Precautions / Restrictions Precautions Precautions: Fall Precaution Comments: Watch BP; VitalGo tilt bed Other Brace: RLE Bledsoe brace locked in extension when up- okay to unlock at EOB  Restrictions Weight Bearing Restrictions: Yes RLE Weight Bearing: Weight bearing as tolerated   Has the patient had 2 or more falls or a fall with injury in the past year?No  Prior Activity Level Community (5-7x/wk): for 1 month PTA pt had noticed a decline in his health/mobility, but prior to that he was very active and running the household  Prior Functional Level Prior Function Level of Independence: Independent Comments:  Pt had retired as a Radiation protection practitioner about 7 years ago, 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  Independent  Indoor Mobility: Did the patient need assistance with walking from room to room (with or without device)? Independent  Stairs: Did the patient need assistance with internal or external stairs (with or without device)? Independent  Functional Cognition: Did the patient need help planning regular tasks such as shopping or remembering to take medications? Independent  Home Assistive Devices / Equipment Home Assistive Devices/Equipment: None Home Equipment: Walker - 2 wheels, Bedside commode  Prior Device Use: Indicate devices/aids used by the patient prior to current illness, exacerbation or injury? None of the above  Current Functional Level Cognition  Overall Cognitive Status: Within Functional Limits for tasks assessed Orientation Level: Oriented X4 General Comments: Distracted by concern NGT is almost out (awaiting RN to fix)    Extremity Assessment (includes Sensation/Coordination)  Upper Extremity Assessment: Generalized weakness  Lower Extremity Assessment: Defer to PT evaluation     ADLs  Overall ADL's : Needs assistance/impaired Eating/Feeding: Independent, Sitting Eating/Feeding Details (indicate cue type and reason): chair position- hoyer lift to chair this session Grooming: Wash/dry hands, Moderate assistance, Sitting Grooming Details (indicate cue type and reason): pt requires (A) to complete hygiene and face shaving with electric razor .  pt fatigued and decr BP at EOB  Upper Body Bathing: Minimal assistance, Sitting, Bed level Lower Body Bathing: Total assistance Lower Body Bathing Details (indicate cue type and reason): incontience and lack of awareness Upper Body Dressing : Moderate assistance Lower Body Dressing: Maximal assistance, Bed level Toilet Transfer: Minimal assistance, Ambulation, BSC, Grab bars, RW(BSC over toilet) Toilet Transfer Details (indicate cue type and reason): Min A to power up into standing from [facility]. Toileting- Clothing Manipulation and Hygiene: Total assistance, +2 for physical assistance, Bed level Toileting - Clothing Manipulation Details (indicate cue type and reason): Min A for standing balance and managing gown like pt performed peri care after BM at toilet Functional mobility during ADLs: Min guard, Rolling walker General ADL Comments: pt in chair and hoyer lift back to bed for bed pan. pt two person (A) into lift pad and then rolling R and L to remove pad. Rn noting need for dressing on buttocks. pt requesting bed pan. pt positioned in chair position for bed    Mobility  Overal bed mobility: Needs Assistance Bed Mobility: Rolling Rolling: Mod assist Supine to sit: Mod assist, +2 for physical assistance Sit to supine: Mod assist, +2 for physical assistance General bed mobility comments: pt reaching with UE toward bed rails to (A) this session. also able to bend up left LE for rollig but assist to bend right LE. Was on bedpan and PT assisted pt with rolling to get bedpan out and clean pt.  Then placed pad to get pt oob.      Transfers  Overall transfer level: Needs assistance Equipment used: Ambulation equipment used Transfer via Lift Equipment: Maximove Transfers: Sit to/from Stand Sit to Stand: From elevated surface, Total assist, +2 physical assistance, Max assist General transfer comment: Pt agreed to get cleaned up and get OOB.  Assisted NT with cleaning and then MAximove to get pt up to chair.  Pt to sit up for 2 hours through llunch.     Ambulation / Gait / Stairs / Wheelchair Mobility  Ambulation/Gait Ambulation/Gait assistance: Editor, commissioning (Feet): 30 Feet Assistive device:  Rolling walker (2 wheeled) Gait Pattern/deviations: Step-through pattern, Decreased stride length General Gait Details: deferred due to severe nausea and vomiting- education focus today  Gait velocity: decreased    Posture / Balance Dynamic Sitting Balance Sitting balance - Comments: Pt sat EOB x 20 minutes with UE support and in [place] plus strapped in. Attempted to work on scooting with pt at EOB, but pt unable to shift weight enough to perform lateral and AP scooting this session.  had to use the pad to move pt.  Balance Overall balance assessment: Needs assistance Sitting-balance support: Bilateral upper extremity supported Sitting balance-Leahy Scale: Fair Sitting balance - Comments: Pt sat EOB x 20 minutes with UE support and in [place] plus strapped in. Attempted to work on scooting with pt at EOB, but pt unable to shift weight enough to perform lateral and AP scooting this session.  had to use the pad to move pt.  Standing balance support: During functional activity, Bilateral upper extremity supported Standing balance-Leahy Scale: Zero Standing balance comment: Pt able to clear buttocks barely off bed with Huntley Dec plus.     Special needs/care consideration BiPAP/CPAP no CPM no Continuous Drip IV  Dialysis no        Days n/a Life Vest no Oxygen 2L/min via [place] Special Bed tilt bed Trach Size no Wound Vac (area)  no      Location n/a Skin abrasion to R leg, ecchymosis to BUEs, MASD to buttocks, petechiae to BLEs, skin tear to R buttocks                              Bowel mgmt: incontinent, last BM 11/03/2018 Bladder mgmt: continent Diabetic mgmt none Behavioral consideration no Chemo/radiation no     Previous Home Environment (from acute therapy documentation) Living Arrangements: Spouse/significant other, Children Available Help at Discharge: Family, Available PRN/intermittently(family is going to try to be with him 24/ 7 if possible) Type of Home: House Home Layout: Multi-level Alternate Level Stairs-Rails: Right Alternate Level Stairs-Number of Steps: 5 Home Access: Stairs to enter Entrance Stairs-Rails: Right Entrance Stairs-Number of Steps: 5 Bathroom Shower/Tub: Engineer, manufacturing systems: Standard Home Care Services: No  Discharge Living Setting Plans for Discharge Living Setting: Patient's home Type of Home at Discharge: House Discharge Home Layout: One level Discharge Home Access: Stairs to enter Entrance Stairs-Rails: Right Entrance Stairs-Number of Steps: 5 Discharge Bathroom Shower/Tub: Tub/shower unit Discharge Bathroom Toilet: Standard Discharge Bathroom Accessibility: Yes How Accessible: Accessible via walker Does the patient have any problems obtaining your medications?: No  Social/Family/Support Systems Patient Roles: Spouse, Parent Anticipated Caregiver: Pt's wife, Elita Quick, and their adult children Anticipated Caregiver's Contact Information: [phone] Ability/Limitations of Caregiver: [name] works as a Radiation protection practitioner, but they plan to involve their children in pt's care at d/c if needed Caregiver Availability: 24/7 Discharge Plan Discussed with Primary Caregiver: Yes Is Caregiver In Agreement with Plan?: Yes Does Caregiver/Family have Issues with Lodging/Transportation while Pt is in Rehab?: No   Goals/Additional Needs Patient/Family Goal for Rehab: PT/OT  supervision-min assist Expected length of stay: 17-21 days Cultural Considerations: n/a Dietary Needs: regular thin Equipment Needs: tbd Pt/Family Agrees to Admission and willing to participate: Yes Program Orientation Provided & Reviewed with Pt/Caregiver Including Roles  & Responsibilities: Yes   Possible need for SNF placement upon discharge: not anticipated   Patient Condition: This patient's medical and functional status has changed since the consult dated: 10/15/2018 in which the Rehabilitation Physician determined  and documented that the patient's condition is appropriate for intensive rehabilitative care in an inpatient rehabilitation facility. See "History of Present Illness" (above) for medical update. Functional changes are: pt currently total +2 for mobility. Patient's medical and functional status update has been discussed with the Rehabilitation physician and patient remains appropriate for inpatient rehabilitation. Will admit to inpatient rehab today.  Preadmission Screen Completed By:  Stephania Fragmin, PT, 11/05/2018 3:46 PM ______________________________________________________________________   Discussed status with Dr. Wynn Banker on 11/05/18 at 3:46 PM and received approval for admission today.  Admission Coordinator:  Stephania Fragmin, time 3:46 PM Dorna Bloom 11/05/18

## 2018-11-04 NOTE — Progress Notes (Signed)
Occupational Therapy Treatment Patient Details Name: Aaron [name] MRN: [id number] DOB: September 24, 1965 Today's Date: 11/04/2018    History of present illness Pt is a 53 y.o. male admitted 10/01/18 with progressive fluid retention, abdominal/BLE swelling. Worked up for anasarca and CHF with EF 15%. Transferred to [facility] for cardiac cath 3/13, found to have multivessel CAD; not a candidate for CABG. CT 3/19 with acute R thigh hematoma; planned PCI deferred secondary to this. Pt with positive gastric occult blood; KUB showed ileus. PMH includes obesity.   OT comments  The patient symptomatic for dizziness with BP Supine 109/68  after standing 121/106  supine in bed to place pad 169/124  once in chair 149/130.  Pt on 2LO2. Pt attempted sit <>stand with sara stedy but unable to sustain static clearance from bed surface. Pt very frustrated this session with inability to stand. Pt encouraged to keep positive as today was the first attempt at standing and he was able to push up off bed surface with DME. Pt also was able to place L LE on footplate without (A) and removed the R LE off footplate automatic without cues.     Follow Up Recommendations  CIR;Supervision/Assistance - 24 hour    Equipment Recommendations       Recommendations for Other Services Rehab consult    Precautions / Restrictions Precautions Precautions: Fall Precaution Comments: Watch BP; VitalGo tilt bed Required Braces or Orthoses: Other Brace Other Brace: RLE Bledsoe brace locked in extension when up- okay to unlock at EOB  Restrictions Weight Bearing Restrictions: Yes RLE Weight Bearing: Weight bearing as tolerated       Mobility Bed Mobility Overal bed mobility: Needs Assistance Bed Mobility: Rolling Rolling: Mod assist   Supine to sit: Mod assist;+2 for physical assistance Sit to supine: Mod assist;+2 for physical assistance   General bed mobility comments: pt reaching with UE toward bed rails to (A) this session.   Difficult for pt to push up from elbow.    Transfers Overall transfer level: Needs assistance Equipment used: Ambulation equipment used Transfers: Sit to/from Stand Sit to Stand: From elevated surface;Total assist;+2 physical assistance;Max assist         General transfer comment: Made 3 attempts to stand with Huntley Dec plus.  First attempt, pt close to clearing bottom off bed.  2 other attempts pt unable to clear bottom even with different adaptations.  Ended session with Maxi move to chair.     Balance Overall balance assessment: Needs assistance Sitting-balance support: Bilateral upper extremity supported Sitting balance-Leahy Scale: Fair Sitting balance - Comments: Pt sat EOB x 20 minutes with UE support and in [place] plus strapped in. Attempted to work on scooting with pt at EOB, but pt unable to shift weight enough to perform lateral and AP scooting this session.  had to use the pad to move pt.    Standing balance support: During functional activity;Bilateral upper extremity supported Standing balance-Leahy Scale: Zero Standing balance comment: Pt able to clear buttocks barely off bed with Huntley Dec plus.                            ADL either performed or assessed with clinical judgement   ADL  Vision       Perception     Praxis      Cognition Arousal/Alertness: Awake/alert Behavior During Therapy: WFL for tasks assessed/performed Overall Cognitive Status: Within Functional Limits for tasks assessed                                          Exercises General Exercises - Lower Extremity Heel Slides: AAROM;Both;Supine;5 reps Other Exercises Other Exercises: pt able to sustain L knee flexion supine in the bed    Shoulder Instructions       General Comments less dizziness     Pertinent Vitals/ Pain       Pain Assessment: No/denies pain  Home Living                                           Prior Functioning/Environment          Comments: Pt had retired as a Radiation protection practitioner about 7 years ago, 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   Frequency  Min 3X/week        Progress Toward Goals  OT Goals(current goals can now be found in the care plan section)  Progress towards OT goals: Progressing toward goals  Acute Rehab OT Goals Patient Stated Goal: to get in the chair OT Goal Formulation: With patient Time For Goal Achievement: 11/09/18 Potential to Achieve Goals: Good ADL Goals Pt Will Perform Grooming: with min guard assist;standing Pt Will Perform Upper Body Dressing: with set-up;with supervision;sitting Pt Will Perform Lower Body Dressing: with min guard assist;with adaptive equipment;sit to/from stand Pt Will Transfer to Toilet: with min assist;ambulating;bedside commode Pt Will Perform Toileting - Clothing Manipulation and hygiene: with min guard assist;sitting/lateral leans;sit to/from stand;with adaptive equipment Pt/caregiver will Perform Home Exercise Program: Increased ROM;Increased strength;Both right and left upper extremity;With written HEP provided;Independently Additional ADL Goal #1: Pt will perform bed mobility with supervision in preparation for ADLs  Plan Discharge plan remains appropriate    Co-evaluation    PT/OT/SLP Co-Evaluation/Treatment: Yes Reason for Co-Treatment: Complexity of the patient's impairments (multi-system involvement);To address functional/ADL transfers;For patient/therapist safety PT goals addressed during session: Mobility/safety with mobility OT goals addressed during session: ADL's and self-care;Proper use of Adaptive equipment and DME;Strengthening/ROM      AM-PAC OT "6 Clicks" Daily Activity     Outcome Measure   Help from another person eating meals?: None Help from another person taking care of personal grooming?: A Little Help from another person toileting, which includes using  toliet, bedpan, or urinal?: Total Help from another person bathing (including washing, rinsing, drying)?: A Lot Help from another person to put on and taking off regular upper body clothing?: A Little Help from another person to put on and taking off regular lower body clothing?: Total 6 Click Score: 14    End of Session Equipment Utilized During Treatment: Oxygen  OT Visit Diagnosis: Unsteadiness on feet (R26.81);Other abnormalities of gait and mobility (R26.89);Muscle weakness (generalized) (M62.81);Pain Pain - part of body: Leg   Activity Tolerance Patient tolerated treatment well   Patient Left with call bell/phone within reach;in bed;with nursing/sitter in room   Nurse Communication Mobility status;Precautions        Time: 1610-9604 OT Time Calculation (min): 54 min  Charges: OT General Charges $OT Visit:  1 Visit OT Treatments $Therapeutic Activity: 23-37 mins   Mateo Flow, OTR/L  Acute Rehabilitation Services Pager: [phone] Office: [phone] .    Mateo Flow 11/04/2018, 1:31 PM

## 2018-11-04 NOTE — Progress Notes (Signed)
Physical Therapy Treatment Patient Details Name: Aaron [name] MRN: [id number] DOB: 16-Apr-1966 Today's Date: 11/04/2018    History of Present Illness Pt is a 53 y.o. male admitted 10/01/18 with progressive fluid retention, abdominal/BLE swelling. Worked up for anasarca and CHF with EF 15%. Transferred to [facility] for cardiac cath 3/13, found to have multivessel CAD; not a candidate for CABG. CT 3/19 with acute R thigh hematoma; planned PCI deferred secondary to this. Pt with positive gastric occult blood; KUB showed ileus. PMH includes obesity.    PT Comments    Pt admitted with above diagnosis. Pt currently with functional limitations due to balance and endurance deficits and deconditioning. Pt continues to struggle with mobility.  Gives max participation but even today with sara plus could not stand more than clearing hips off bed.  BP was not soft today.  Supine 109/68, after standing 121/106, supine in bed to place pad 169/124, once in chair 149/130.  Pt on 2LO2.  Pt frustrated that his legs are so weak.  Will continue to follow.   Pt will benefit from skilled PT to increase their independence and safety with mobility to allow discharge to the venue listed below.     Follow Up Recommendations  CIR;Supervision/Assistance - 24 hour     Equipment Recommendations  Other (comment)(TBD)    Recommendations for Other Services       Precautions / Restrictions Precautions Precautions: Fall Precaution Comments: Watch BP; VitalGo tilt bed Restrictions Weight Bearing Restrictions: Yes RLE Weight Bearing: Weight bearing as tolerated    Mobility  Bed Mobility Overal bed mobility: Needs Assistance Bed Mobility: Rolling Rolling: Mod assist   Supine to sit: Mod assist;+2 for physical assistance Sit to supine: Mod assist;+2 for physical assistance   General bed mobility comments: pt reaching with UE toward bed rails to (A) this session.  Difficult for pt to push up from elbow.    Transfers Overall  transfer level: Needs assistance Equipment used: Ambulation equipment used Transfers: Sit to/from Stand Sit to Stand: From elevated surface;Total assist;+2 physical assistance;Max assist         General transfer comment: Made 3 attempts to stand with [place] plus.  First attempt, pt close to clearing bottom off bed.  2 other attempts pt unable to clear bottom even with different adaptations.  Ended session with Maxi move to chair.   Ambulation/Gait                 Stairs             Wheelchair Mobility    Modified Rankin (Stroke Patients Only)       Balance Overall balance assessment: Needs assistance Sitting-balance support: Bilateral upper extremity supported Sitting balance-Leahy Scale: Fair Sitting balance - Comments: Pt sat EOB x 20 minutes with UE support and in [place] plus strapped in. Attempted to work on scooting with pt at EOB, but pt unable to shift weight enough to perform lateral and AP scooting this session.  had to use the pad to move pt.    Standing balance support: During functional activity;Bilateral upper extremity supported Standing balance-Leahy Scale: Zero Standing balance comment: Pt able to clear buttocks barely off bed with [place] Dec plus.                             Cognition Arousal/Alertness: Awake/alert Behavior During Therapy: WFL for tasks assessed/performed Overall Cognitive Status: Within Functional Limits for tasks assessed  Exercises General Exercises - Lower Extremity Heel Slides: AAROM;Both;Supine;5 reps Other Exercises Other Exercises: pt able to sustain L knee flexion supine in the bed     General Comments General comments (skin integrity, edema, etc.): Less dizziness reported.  See BP above      Pertinent Vitals/Pain Pain Assessment: No/denies pain    Home Living                      Prior Function            PT Goals (current goals can now  be found in the care plan section) Progress towards PT goals: Progressing toward goals    Frequency    Min 3X/week      PT Plan Current plan remains appropriate    Co-evaluation PT/OT/SLP Co-Evaluation/Treatment: Yes Reason for Co-Treatment: Complexity of the patient's impairments (multi-system involvement) PT goals addressed during session: Mobility/safety with mobility        AM-PAC PT "6 Clicks" Mobility   Outcome Measure  Help needed turning from your back to your side while in a flat bed without using bedrails?: A Lot Help needed moving from lying on your back to sitting on the side of a flat bed without using bedrails?: A Lot Help needed moving to and from a bed to a chair (including a wheelchair)?: Total Help needed standing up from a chair using your arms (e.g., wheelchair or bedside chair)?: Total Help needed to walk in hospital room?: Total Help needed climbing 3-5 steps with a railing? : Total 6 Click Score: 8    End of Session Equipment Utilized During Treatment: Gait belt;Oxygen Activity Tolerance: Patient limited by fatigue Patient left: in chair;with call bell/phone within reach Nurse Communication: Mobility status;Need for lift equipment PT Visit Diagnosis: Muscle weakness (generalized) (M62.81);Difficulty in walking, not elsewhere classified (R26.2);Pain Pain - part of body: (Abdomen)     Time: 3007-6226 PT Time Calculation (min) (ACUTE ONLY): 54 min  Charges:  $Therapeutic Activity: 23-37 mins                     Kalah Pflum,PT Acute Rehabilitation Services Pager:  [phone]  Office:  [phone]     Berline Lopes 11/04/2018, 10:32 AM

## 2018-11-05 ENCOUNTER — Inpatient Hospital Stay (HOSPITAL_COMMUNITY)
Admission: RE | Admit: 2018-11-05 | Discharge: 2018-11-12 | DRG: 945 | Disposition: A | Discharge status: Other Acute Inpatient Hospital | Payer: Medicaid Other | Source: Intra-hospital | Attending: Physical Medicine & Rehabilitation | Admitting: Physical Medicine & Rehabilitation

## 2018-11-05 ENCOUNTER — Other Ambulatory Visit: Payer: Self-pay

## 2018-11-05 ENCOUNTER — Encounter (HOSPITAL_COMMUNITY): Payer: Self-pay | Admitting: Nurse Practitioner

## 2018-11-05 DIAGNOSIS — R000 Tachycardia, unspecified: Secondary | ICD-10-CM | POA: Diagnosis present

## 2018-11-05 DIAGNOSIS — I255 Ischemic cardiomyopathy: Secondary | ICD-10-CM | POA: Diagnosis present

## 2018-11-05 DIAGNOSIS — E871 Hypo-osmolality and hyponatremia: Secondary | ICD-10-CM | POA: Diagnosis present

## 2018-11-05 DIAGNOSIS — K760 Fatty (change of) liver, not elsewhere classified: Secondary | ICD-10-CM | POA: Diagnosis present

## 2018-11-05 DIAGNOSIS — E669 Obesity, unspecified: Secondary | ICD-10-CM | POA: Diagnosis present

## 2018-11-05 DIAGNOSIS — E538 Deficiency of other specified B group vitamins: Secondary | ICD-10-CM | POA: Diagnosis present

## 2018-11-05 DIAGNOSIS — Z833 Family history of diabetes mellitus: Secondary | ICD-10-CM | POA: Diagnosis not present

## 2018-11-05 DIAGNOSIS — R600 Localized edema: Secondary | ICD-10-CM | POA: Diagnosis present

## 2018-11-05 DIAGNOSIS — R0602 Shortness of breath: Secondary | ICD-10-CM

## 2018-11-05 DIAGNOSIS — I482 Chronic atrial fibrillation, unspecified: Secondary | ICD-10-CM | POA: Diagnosis present

## 2018-11-05 DIAGNOSIS — R0902 Hypoxemia: Secondary | ICD-10-CM | POA: Diagnosis present

## 2018-11-05 DIAGNOSIS — I2510 Atherosclerotic heart disease of native coronary artery without angina pectoris: Secondary | ICD-10-CM | POA: Diagnosis present

## 2018-11-05 DIAGNOSIS — Z87891 Personal history of nicotine dependence: Secondary | ICD-10-CM

## 2018-11-05 DIAGNOSIS — J869 Pyothorax without fistula: Secondary | ICD-10-CM | POA: Diagnosis not present

## 2018-11-05 DIAGNOSIS — I272 Pulmonary hypertension, unspecified: Secondary | ICD-10-CM | POA: Diagnosis present

## 2018-11-05 DIAGNOSIS — R05 Cough: Secondary | ICD-10-CM

## 2018-11-05 DIAGNOSIS — I5022 Chronic systolic (congestive) heart failure: Secondary | ICD-10-CM | POA: Diagnosis present

## 2018-11-05 DIAGNOSIS — Z6835 Body mass index (BMI) 35.0-35.9, adult: Secondary | ICD-10-CM | POA: Diagnosis not present

## 2018-11-05 DIAGNOSIS — K2971 Gastritis, unspecified, with bleeding: Secondary | ICD-10-CM | POA: Diagnosis present

## 2018-11-05 DIAGNOSIS — K649 Unspecified hemorrhoids: Secondary | ICD-10-CM | POA: Diagnosis present

## 2018-11-05 DIAGNOSIS — R5381 Other malaise: Secondary | ICD-10-CM

## 2018-11-05 DIAGNOSIS — I5021 Acute systolic (congestive) heart failure: Secondary | ICD-10-CM | POA: Diagnosis present

## 2018-11-05 DIAGNOSIS — R059 Cough, unspecified: Secondary | ICD-10-CM

## 2018-11-05 DIAGNOSIS — I4891 Unspecified atrial fibrillation: Secondary | ICD-10-CM | POA: Diagnosis present

## 2018-11-05 DIAGNOSIS — D62 Acute posthemorrhagic anemia: Secondary | ICD-10-CM | POA: Diagnosis present

## 2018-11-05 DIAGNOSIS — D509 Iron deficiency anemia, unspecified: Secondary | ICD-10-CM | POA: Diagnosis present

## 2018-11-05 MED ORDER — ROSUVASTATIN CALCIUM 20 MG PO TABS: 20.0000 mg | ORAL_TABLET | Freq: Every day | ORAL | 0 refills | Status: DC

## 2018-11-05 MED ORDER — FUROSEMIDE 40 MG PO TABS: 40.0000 mg | ORAL_TABLET | Freq: Every day | ORAL | 0 refills | Status: DC

## 2018-11-05 MED ORDER — TRAZODONE HCL 50 MG PO TABS
25.0000 mg | ORAL_TABLET | Freq: Every evening | ORAL | Status: DC | PRN
  Administered 2018-11-09: 50 mg via ORAL
  Filled 2018-11-05 (×2): qty 1

## 2018-11-05 MED ORDER — ACETAMINOPHEN 325 MG PO TABS: 650.0000 mg | ORAL_TABLET | ORAL | Status: DC | PRN

## 2018-11-05 MED ORDER — PROCHLORPERAZINE 25 MG RE SUPP: 12.5000 mg | Freq: Four times a day (QID) | RECTAL | Status: DC | PRN

## 2018-11-05 MED ORDER — ATORVASTATIN CALCIUM 80 MG PO TABS: 80.0000 mg | ORAL_TABLET | Freq: Every day | ORAL | Status: DC

## 2018-11-05 MED ORDER — DIPHENHYDRAMINE HCL 12.5 MG/5ML PO ELIX: 12.5000 mg | ORAL_SOLUTION | Freq: Four times a day (QID) | ORAL | Status: DC | PRN

## 2018-11-05 MED ORDER — FUROSEMIDE 40 MG PO TABS
40.0000 mg | ORAL_TABLET | Freq: Every day | ORAL | Status: DC
  Administered 2018-11-06 – 2018-11-12 (×7): 40 mg via ORAL
  Filled 2018-11-05 (×7): qty 1

## 2018-11-05 MED ORDER — ACETAMINOPHEN 325 MG PO TABS
325.0000 mg | ORAL_TABLET | ORAL | Status: DC | PRN
  Administered 2018-11-10: 10:00:00 650 mg via ORAL
  Filled 2018-11-05: qty 2

## 2018-11-05 MED ORDER — FLEET ENEMA 7-19 GM/118ML RE ENEM: 1.0000 | ENEMA | Freq: Once | RECTAL | Status: DC | PRN

## 2018-11-05 MED ORDER — AMIODARONE HCL 200 MG PO TABS
200.0000 mg | ORAL_TABLET | Freq: Two times a day (BID) | ORAL | Status: DC
  Administered 2018-11-05 – 2018-11-12 (×14): 200 mg via ORAL
  Filled 2018-11-05 (×14): qty 1

## 2018-11-05 MED ORDER — CARVEDILOL 3.125 MG PO TABS: 3.1250 mg | ORAL_TABLET | Freq: Two times a day (BID) | ORAL | 0 refills | Status: DC

## 2018-11-05 MED ORDER — BISACODYL 10 MG RE SUPP
10.0000 mg | Freq: Every day | RECTAL | Status: DC | PRN
  Administered 2018-11-06: 10 mg via RECTAL
  Filled 2018-11-05: qty 1

## 2018-11-05 MED ORDER — ALUM & MAG HYDROXIDE-SIMETH 200-200-20 MG/5ML PO SUSP: 30.0000 mL | ORAL | Status: DC | PRN

## 2018-11-05 MED ORDER — DIGOXIN 125 MCG PO TABS
0.1250 mg | ORAL_TABLET | Freq: Every day | ORAL | Status: DC
  Administered 2018-11-06 – 2018-11-12 (×7): 0.125 mg via ORAL
  Filled 2018-11-05 (×7): qty 1

## 2018-11-05 MED ORDER — DIGOXIN 125 MCG PO TABS: 0.1250 mg | ORAL_TABLET | Freq: Every day | ORAL | Status: DC

## 2018-11-05 MED ORDER — PROCHLORPERAZINE EDISYLATE 10 MG/2ML IJ SOLN: 5.0000 mg | Freq: Four times a day (QID) | INTRAMUSCULAR | Status: DC | PRN

## 2018-11-05 MED ORDER — LOSARTAN POTASSIUM 50 MG PO TABS: 25.0000 mg | ORAL_TABLET | Freq: Every day | ORAL | Status: DC

## 2018-11-05 MED ORDER — GUAIFENESIN-DM 100-10 MG/5ML PO SYRP: 5.0000 mL | ORAL_SOLUTION | Freq: Four times a day (QID) | ORAL | Status: DC | PRN

## 2018-11-05 MED ORDER — CARVEDILOL 3.125 MG PO TABS
3.1250 mg | ORAL_TABLET | Freq: Two times a day (BID) | ORAL | Status: DC
  Administered 2018-11-05 – 2018-11-12 (×14): 3.125 mg via ORAL
  Filled 2018-11-05 (×14): qty 1

## 2018-11-05 MED ORDER — FOLIC ACID 1 MG PO TABS
1.0000 mg | ORAL_TABLET | Freq: Every day | ORAL | Status: DC
  Administered 2018-11-06 – 2018-11-12 (×7): 1 mg via ORAL
  Filled 2018-11-05 (×7): qty 1

## 2018-11-05 MED ORDER — NICOTINE 14 MG/24HR TD PT24
14.0000 mg | MEDICATED_PATCH | Freq: Every day | TRANSDERMAL | Status: DC
  Administered 2018-11-06 – 2018-11-12 (×7): 14 mg via TRANSDERMAL
  Filled 2018-11-05 (×7): qty 1

## 2018-11-05 MED ORDER — ROSUVASTATIN CALCIUM 20 MG PO TABS
20.0000 mg | ORAL_TABLET | Freq: Every day | ORAL | Status: DC
  Administered 2018-11-05 – 2018-11-11 (×7): 20 mg via ORAL
  Filled 2018-11-05 (×7): qty 1

## 2018-11-05 MED ORDER — APIXABAN 5 MG PO TABS
5.0000 mg | ORAL_TABLET | Freq: Two times a day (BID) | ORAL | Status: DC
  Administered 2018-11-05 – 2018-11-12 (×14): 5 mg via ORAL
  Filled 2018-11-05 (×14): qty 1

## 2018-11-05 MED ORDER — POLYETHYLENE GLYCOL 3350 17 G PO PACK
17.0000 g | PACK | Freq: Two times a day (BID) | ORAL | Status: DC
  Administered 2018-11-05 – 2018-11-08 (×2): 17 g via ORAL
  Filled 2018-11-05 (×4): qty 1

## 2018-11-05 MED ORDER — POLYETHYLENE GLYCOL 3350 17 G PO PACK: 17.0000 g | PACK | Freq: Every day | ORAL | Status: DC | PRN

## 2018-11-05 MED ORDER — PROCHLORPERAZINE MALEATE 5 MG PO TABS: 5.0000 mg | ORAL_TABLET | Freq: Four times a day (QID) | ORAL | Status: DC | PRN

## 2018-11-05 MED ORDER — PANTOPRAZOLE SODIUM 40 MG PO TBEC
40.0000 mg | DELAYED_RELEASE_TABLET | Freq: Two times a day (BID) | ORAL | Status: DC
  Administered 2018-11-06 – 2018-11-12 (×13): 40 mg via ORAL
  Filled 2018-11-05 (×13): qty 1

## 2018-11-05 MED ORDER — LOSARTAN POTASSIUM 50 MG PO TABS
25.0000 mg | ORAL_TABLET | Freq: Every day | ORAL | Status: DC
  Administered 2018-11-06 – 2018-11-12 (×7): 25 mg via ORAL
  Filled 2018-11-05 (×8): qty 1

## 2018-11-05 NOTE — Progress Notes (Signed)
Jamse Arn, MD  Physician  Physical Medicine and Rehabilitation  Consult Note  Signed  Date of Service:  10/14/2018 10:21 AM       Related encounter: ED to Hosp-Admission (Current) from 10/01/2018 in [facility]      Signed      Expand All Collapse All    Show:Clear all '[x]'$ Manual'[x]'$ Template'[]'$ Copied  Added by: '[x]'$ Love, Ivan Anchors, PA-C'[x]'$ Jamse Arn, MD  '[]'$ Hover for details      Physical Medicine and Rehabilitation Consult   Reason for Consult: Debility  Referring Physician: Dr. Haroldine Laws.    HPI: Kieth Hartis is a 53 y.o. male who was admitted on 10/01/2018 with 3-4 weeks with abdominal pain, peripheral edema and SOB.  History taken from chart review and patient.  He was noted to have anasarca with abnormal LFTs,  AKI and RLL effusion with concerns of PNA due to leucocytosis. Abdominal ultrasound revealed cholelithiasis with fatty liver and liver doppler negative for thrombosis. 2 D echo done revealing diffuse hypokinesis with ejection fraction of 15%.  Dr. Harrell Gave consulted for input and and felt that AKI/ elevated LFTs likely due to congestion. He underwent cardiac cath on 10/09/2018 by Dr. 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  Fluid overload improving with medication adjustment and milrinone on board.  He developed A fib with RVR and placed on IV amiodarone for rate control.  Patient not felt to be a candidate for CABG per Dr. Roxan Hockey and being treated medically. Plans for PCI of LAD/LCx later this week as well as DCCV is does not convert on amiodarone.  Therapy ongoing and patient limited by poor effort, issues with cardia as well as DOE. CIR recommended due to debility.    Review of Systems  Constitutional: Positive for malaise/fatigue. Negative for chills.  HENT: Negative for hearing loss.   Eyes: Negative for blurred vision and double vision.  Respiratory: Positive for  shortness of breath. Negative for cough and hemoptysis.   Cardiovascular: Positive for leg swelling. Negative for chest pain and palpitations.  Gastrointestinal: Positive for nausea. Negative for abdominal pain.  Musculoskeletal: Positive for myalgias.  Skin: Negative for itching and rash.  Neurological: Positive for focal weakness and weakness. Negative for dizziness and headaches.  Psychiatric/Behavioral: The patient is nervous/anxious.   All other systems reviewed and are negative.         Past Medical History:  Diagnosis Date  . Morbid obesity ([place])          Past Surgical History:  Procedure Laterality Date  . RIGHT/LEFT HEART CATH AND CORONARY ANGIOGRAPHY N/A 10/09/2018   Procedure: RIGHT/LEFT HEART CATH AND CORONARY ANGIOGRAPHY;  Surgeon: Wellington Hampshire, MD;  Location: [place];  Service: Cardiovascular;  Laterality: N/A;         Family History  Problem Relation Age of Onset  . Alcoholism Father   . Rheumatic fever Mother   . Diabetes Mother   . Drug abuse Brother      Social History: Married. Wife is a paramedic and they have 2 grown children. He has used to be a paramedic--househusband since 2007. He reports that he has never smoked. He chews tobacco three times a day (a can?)  He reports that he does not drink alcohol or use drugs.    Allergies: No Known Allergies    No medications prior to admission.    Home: Home Living Family/patient expects to be discharged to:: Private residence  Living Arrangements: Spouse/significant other, Children Available Help at Discharge: Family, Available PRN/intermittently(family is going to try to be with him 24/ 7 if possible) Type of Home: House Home Access: Stairs to enter [employer] of Steps: 5 Entrance Stairs-Rails: Right Home Layout: Multi-level Alternate Level Stairs-Number of Steps: 5 Alternate Level Stairs-Rails: Right Bathroom Shower/Tub: Print production planner: Standard Home Equipment: Environmental consultant - 2 wheels, Bedside commode  Functional History: Prior Function Level of Independence: Independent Comments: Prior to recent weakness, 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 within last 3-4 weeks everything became a lot harder, SOB, difficulty moving and this last week could not make it up and down his own steps in his house Functional Status:  Mobility: Bed Mobility Overal bed mobility: Needs Assistance Bed Mobility: Supine to Sit, Sit to Supine Rolling: Mod assist Supine to sit: Min guard, HOB elevated Sit to supine: Min guard General bed mobility comments: Min Guard A for safety and use of bed rail to pull Transfers Overall transfer level: Needs assistance Equipment used: Rolling walker (2 wheeled) Transfers: Sit to/from Stand Sit to Stand: Min assist, From elevated surface General transfer comment: Min A to power up into standing  Ambulation/Gait Ambulation/Gait assistance: Min Web designer (Feet): 30 Feet Assistive device: Rolling walker (2 wheeled) Gait Pattern/deviations: Step-through pattern, Decreased stride length General Gait Details: Slow, mildly unsteady gait but no overt LOB. Sp02 ranged from 89-92% on RA, HR ranged from 100-142 bpm A-fib. 2.4 DOE. Declined any further.  Gait velocity: decreased  ADL: ADL Overall ADL's : Needs assistance/impaired Eating/Feeding: Independent, Bed level Grooming: Supervision/safety, Set up, Sitting, Wash/dry hands Grooming Details (indicate cue type and reason): Pt fatiguing and feeling dizzy after toileting, so returned to EOB and provided hand saniziter Upper Body Bathing: Minimal assistance, Sitting, Bed level Lower Body Bathing: Maximal assistance, Bed level Upper Body Dressing : Minimal assistance, Sitting Lower Body Dressing: Maximal assistance, Bed level Toilet Transfer: Minimal assistance, Ambulation, BSC, Grab bars, RW(BSC over toilet) Toilet Transfer Details  (indicate cue type and reason): Min A to power up into standing from [facility]. Toileting- Clothing Manipulation and Hygiene: Minimal assistance, Sit to/from stand Toileting - Clothing Manipulation Details (indicate cue type and reason): Min A for standing balance and managing gown like pt performed peri care after BM at toilet Functional mobility during ADLs: Min guard, Rolling walker General ADL Comments: Pt eager to participate when OT arrived. Pt performing mobility to toilet and performing toilet hygiene with Min A. Providing pt with education on theraband exercises.   Cognition: Cognition Overall Cognitive Status: Within Functional Limits for tasks assessed Orientation Level: Oriented X4 Cognition Arousal/Alertness: Awake/alert Behavior During Therapy: WFL for tasks assessed/performed(gets irritated easily when pushed) Overall Cognitive Status: Within Functional Limits for tasks assessed General Comments: Does not seem to comprehend the importance of mobility with regards to his health condition; states he want to get better and move however when it comes down to it, always stating excuses re nausea, pain, lack of sleep, food etc    Blood pressure 97/71, pulse 91, temperature 97.7 F (36.5 C), temperature source Oral, resp. rate (!) 23, height 6' 4" (1.93 m), weight 131.9 kg, SpO2 96 %. Physical Exam  Nursing note and vitals reviewed. Constitutional: He is oriented to person, place, and time. He appears well-developed.  Morbidly obese Anasarca  HENT:  Head: Normocephalic and atraumatic.  Eyes: EOM are normal. Right eye exhibits no discharge. Left eye exhibits no discharge.  Neck: Normal range of motion.  Neck supple.  Cardiovascular:  Irregularly irregular  Respiratory: Effort normal and breath sounds normal.  + [place]  GI: Soft. Bowel sounds are normal. He exhibits distension.  Musculoskeletal:        General: Edema present. No tenderness.     Comments: BLE with unna boots and 2+  pedal edema noted.    Neurological: He is alert and oriented to person, place, and time.  Motor: Bilateral upper extremities: 5/5 proximal distal Left lower extremity: 4+/5 proximal distal Right lower extremity: Hip flexion 3/5, knee extension 4/5, ankle dorsiflexion 4/5  Skin:  See above  Psychiatric: He has a normal mood and affect. His behavior is normal.    LabResultsLast24Hours       Results for orders placed or performed during the hospital encounter of 10/01/18 (from the past 24 hour(s))  Heparin level (unfractionated)     Status: Abnormal   Collection Time: 10/14/18  2:54 AM  Result Value Ref Range   Heparin Unfractionated 0.83 (H) 0.30 - 0.70 IU/mL  CBC     Status: Abnormal   Collection Time: 10/14/18  2:54 AM  Result Value Ref Range   WBC 8.8 4.0 - 10.5 K/uL   RBC 4.31 4.22 - 5.81 MIL/uL   Hemoglobin 11.9 (L) 13.0 - 17.0 g/dL   HCT 38.3 (L) 39.0 - 52.0 %   MCV 88.9 80.0 - 100.0 fL   MCH 27.6 26.0 - 34.0 pg   MCHC 31.1 30.0 - 36.0 g/dL   RDW 16.3 (H) 11.5 - 15.5 %   Platelets 274 150 - 400 K/uL   nRBC 0.0 0.0 - 0.2 %  Basic metabolic panel     Status: Abnormal   Collection Time: 10/14/18  2:54 AM  Result Value Ref Range   Sodium 135 135 - 145 mmol/L   Potassium 3.3 (L) 3.5 - 5.1 mmol/L   Chloride 101 98 - 111 mmol/L   CO2 28 22 - 32 mmol/L   Glucose, Bld 98 70 - 99 mg/dL   BUN 14 6 - 20 mg/dL   Creatinine, Ser 0.94 0.61 - 1.24 mg/dL   Calcium 8.1 (L) 8.9 - 10.3 mg/dL   GFR calc non Af Amer >60 >60 mL/min   GFR calc Af Amer >60 >60 mL/min   Anion gap 6 5 - 15  Cooxemetry Panel (carboxy, met, total hgb, O2 sat)     Status: None   Collection Time: 10/14/18  5:16 AM  Result Value Ref Range   Total hemoglobin 12.3 12.0 - 16.0 g/dL   O2 Saturation 64.0 %   Carboxyhemoglobin 1.5 0.5 - 1.5 %   Methemoglobin 1.4 0.0 - 1.5 %     ImagingResults(Last48hours)  No results found.    Assessment/Plan: Diagnosis: Debility   Labs independently reviewed.  Records reviewed and summated above.  1. Does the need for close, 24 hr/day medical supervision in concert with the patient's rehab needs make it unreasonable for this patient to be served in a less intensive setting? Yes  2. Co-Morbidities requiring supervision/potential complications: Acute systolic CHF (Monitor in accordance with increased physical activity and avoid UE resistance excercises), cholelithiasis, fatty liver, Tachycardia (monitor in accordance with pain and increasing activity), tachypnea (monitor RR and O2 Sats with increased physical exertion, wean supplemental oxygen when appropriate), Tachycardia (monitor in accordance with pain and increasing activity), hypokalemia (repeat labs, continue to monitor and replete as necessary), CAD/A. fib with RVR (transition from upper GTT to oral medications when appropriate) 3. Due to safety, disease  management and patient education, does the patient require 24 hr/day rehab nursing? Yes 4. Does the patient require coordinated care of a physician, rehab nurse, PT (1-2 hrs/day, 5 days/week) and OT (1-2 hrs/day, 5 days/week) to address physical and functional deficits in the context of the above medical diagnosis(es)? Yes Addressing deficits in the following areas: balance, endurance, locomotion, strength, transferring, bathing, dressing, toileting and psychosocial support 5. Can the patient actively participate in an intensive therapy program of at least 3 hrs of therapy per day at least 5 days per week? Potentially 6. The potential for patient to make measurable gains while on inpatient rehab is excellent 7. Anticipated functional outcomes upon discharge from inpatient rehab are supervision  with PT, supervision with OT, n/a with SLP. 8. Estimated rehab length of stay to reach the above functional goals is: 17-21 days. 9. Anticipated D/C setting: Home 10. Anticipated post D/C treatments: HH therapy and Home excercise  program 11. Overall Rehab/Functional Prognosis: good  RECOMMENDATIONS: This patient's condition is appropriate for continued rehabilitative care in the following setting: CIR if/when patient medically stable and able to tolerate 3 hours of therapy a day.  Patient unable to tolerate 3 hours of therapy per day, recommend SNF. Patient has agreed to participate in recommended program. Potentially Note that insurance prior authorization may be required for reimbursement for recommended care.  Comment: Rehab Admissions Coordinator to follow up.   I have personally performed a face to face diagnostic evaluation, including, but not limited to relevant history and physical exam findings, of this patient and developed relevant assessment and plan.  Additionally, I have reviewed and concur with the physician assistant's documentation above.   Delice Lesch, MD, ABPMR Bary Leriche, PA-C 10/14/2018        Revision History                        Routing History

## 2018-11-05 NOTE — Progress Notes (Signed)
Patient ID: Beldon Shaak, male   DOB: 02-07-1966, 53 y.o.   MRN: [id number] Patient admitted to [phone] via tilt bed, escorted by nursing staff.  Patient oriented to unit, specifically to personal belongings policy, fall prevention policy, and visitation policy.  PICC line to left arm intact.  Healing pressure injury to right nare from previous NG tube for ileus.  Stage II to right buttocks with foam, removed for assessment, appears to be improving.  Oxygen on via [place] at 2L.  CPM at the bedside, to be put on via ortho.  Patient denies pain at this time.  Appears to be in no immediate distress at this time.  Dani Gobble, RN

## 2018-11-05 NOTE — Progress Notes (Signed)
Physical Therapy Treatment Patient Details Name: Aaron [name] MRN: [id number] DOB: 1966/01/12 Today's Date: 11/05/2018    History of Present Illness Pt is a 53 y.o. male admitted 10/01/18 with progressive fluid retention, abdominal/BLE swelling. Worked up for anasarca and CHF with EF 15%. Transferred to [facility] for cardiac cath 3/13, found to have multivessel CAD; not a candidate for CABG. CT 3/19 with acute R thigh hematoma; planned PCI deferred secondary to this. Pt with positive gastric occult blood; KUB showed ileus. PMH includes obesity.    PT Comments    Pt admitted with above diagnosis. Pt currently with functional limitations due to the deficits listed below (see PT Problem List). Pt was on bedpan on arrival.  Assisted NT to clean pt and get him off bedpan.  Then pt was lifted to chair using MAxi move lift.  Pt to sit 2 hours while eating lunch.   Pt will benefit from skilled PT to increase their independence and safety with mobility to allow discharge to the venue listed below.    Follow Up Recommendations  CIR;Supervision/Assistance - 24 hour     Equipment Recommendations  Other (comment)(TBD)    Recommendations for Other Services       Precautions / Restrictions Precautions Precautions: Fall Precaution Comments: Watch BP; VitalGo tilt bed Required Braces or Orthoses: Other Brace Other Brace: RLE Bledsoe brace locked in extension when up- okay to unlock at EOB  Restrictions Weight Bearing Restrictions: Yes RLE Weight Bearing: Weight bearing as tolerated    Mobility  Bed Mobility Overal bed mobility: Needs Assistance Bed Mobility: Rolling Rolling: Mod assist         General bed mobility comments: pt reaching with UE toward bed rails to (A) this session. also able to bend up left LE for rollig but assist to bend right LE. Was on bedpan and PT assisted pt with rolling to get bedpan out and clean pt.  Then placed pad to get pt oob.   Transfers Overall transfer level: Needs  assistance Equipment used: Ambulation equipment used             General transfer comment: Pt agreed to get cleaned up and get OOB.  Assisted NT with cleaning and then MAximove to get pt up to chair.  Pt to sit up for 2 hours through llunch.   Ambulation/Gait                 Stairs             Wheelchair Mobility    Modified Rankin (Stroke Patients Only)       Balance                                            Cognition Arousal/Alertness: Awake/alert Behavior During Therapy: WFL for tasks assessed/performed Overall Cognitive Status: Within Functional Limits for tasks assessed                                        Exercises General Exercises - Lower Extremity Ankle Circles/Pumps: AROM;Both;Supine;20 reps Heel Slides: AAROM;Both;Supine;5 reps Straight Leg Raises: AAROM;Both;Supine;10 reps Other Exercises Other Exercises: pt able to sustain L knee flexion supine in the bed     General Comments        Pertinent Vitals/Pain Pain Assessment: No/denies  pain    Home Living                      Prior Function            PT Goals (current goals can now be found in the care plan section) Acute Rehab PT Goals Patient Stated Goal: to get in the chair Progress towards PT goals: Progressing toward goals    Frequency    Min 3X/week      PT Plan Current plan remains appropriate    Co-evaluation              AM-PAC PT "6 Clicks" Mobility   Outcome Measure  Help needed turning from your back to your side while in a flat bed without using bedrails?: A Lot Help needed moving from lying on your back to sitting on the side of a flat bed without using bedrails?: A Lot Help needed moving to and from a bed to a chair (including a wheelchair)?: Total Help needed standing up from a chair using your arms (e.g., wheelchair or bedside chair)?: Total Help needed to walk in hospital room?: Total Help needed  climbing 3-5 steps with a railing? : Total 6 Click Score: 8    End of Session Equipment Utilized During Treatment: Gait belt;Oxygen Activity Tolerance: Patient limited by fatigue Patient left: in chair;with call bell/phone within reach Nurse Communication: Mobility status;Need for lift equipment PT Visit Diagnosis: Muscle weakness (generalized) (M62.81);Difficulty in walking, not elsewhere classified (R26.2);Pain Pain - part of body: (Abdomen)     Time: 7902-4097 PT Time Calculation (min) (ACUTE ONLY): 20 min  Charges:  $Therapeutic Activity: 8-22 mins                     Zubin Pontillo,PT Acute Rehabilitation Services Pager:  [phone]  Office:  [phone]     Berline Lopes 11/05/2018, 12:21 PM

## 2018-11-05 NOTE — H&P (Signed)
Physical Medicine and Rehabilitation Admission H&P        Chief Complaint  Patient presents with   Debility       HPI: Aaron [name] is a 53 year old male admitted on 10/01/18 with abdominal pain peripheral edema and shortness of breath.  He was noted to have anasarca with abnormal LFTs, AKI and RLL effusion with concerns of pneumonia due to leukocytosis.  Abdominal ultrasound done revealing cholelithiasis with fatty liver and liver Doppler was negative for thrombosis.  2D echo revealed diffuse hypokinesis with ejection fraction of 15%.  Dr. Loney Hering consulted for input and felt that AKI/elevated LFTs likely due to congestion.  He underwent cardiac cath by Dr. Bascom Levels revealing severe three-vessel CAD with global hypokinesis with LVEF 15% and moderate pulmonary hypertension.  Dr. Dorris Fetch did not feel patient to be a CABG candidate and medical treatment recommended.  He did develop A. fib with RVR requiring IV amiodarone for rate control and was started on heparin.  He did develop right thigh pain with quad weakness felt to be due to femoral nerve compression and Dr. August Saucer recommended Bledsoe brace for support when up and that hematoma would resolve with time.[name]   He also developed nausea vomiting due to adynamic ileus that improved with placement of NG tube.  He was noted to have coffee-ground fluid from NG tube and Dr.Karki /GI recommended n.p.o. with IV Protonix with monitoring H/H for further drop. He underwent EGD on 3/22 revealing esophagitis with nonbleeding esophageal ulcers and erosive gastropathy.  Clear liquids as well as supportive care recommended.  He continues to have issues with N/V as well as abdominal distention with concerns of early SBO.  GI symptoms slowly improved with tolerance of NG tube clamping and eventually removed by 4/3. He was cleared to start anticoagulation and diet slowly advanced to regular.  Acute systolic CHF compensated--aldactone changed to lasix on 4/8 due to  upward trend in weights .  H&H stable and he continues to have intermittent issues with anxiety as well as dizziness.  Patient noted to be severely deconditioned and therapy working on pre-gait measures as well as sitting at the edge of the bed.  CIR recommended due to deconditioning      ROS   Denies headache Denies chest pain shortness of breath Denies nausea vomiting diarrhea constipation Denies dysuria using a urinal to void Mild constipation but had BM today Musculoskeletal denies any back pain does have right thigh pain Neurologic has right thigh numbness Extremities has persistent swelling although improved       Past Medical History:  Diagnosis Date   Morbid obesity (HCC)             Past Surgical History:  Procedure Laterality Date   ESOPHAGOGASTRODUODENOSCOPY (EGD) WITH PROPOFOL N/A 10/18/2018    Procedure: ESOPHAGOGASTRODUODENOSCOPY (EGD) WITH PROPOFOL;  Surgeon: Kerin Salen, MD;  Location: [facility] ENDOSCOPY;  Service: Gastroenterology;  Laterality: N/A;   RIGHT/LEFT HEART CATH AND CORONARY ANGIOGRAPHY N/A 10/09/2018    Procedure: RIGHT/LEFT HEART CATH AND CORONARY ANGIOGRAPHY;  Surgeon: Iran Ouch, MD;  Location: [facility] INVASIVE CV LAB;  Service: Cardiovascular;  Laterality: N/A;           Family History  Problem Relation Age of Onset   Alcoholism Father     Rheumatic fever Mother     Diabetes Mother     Drug abuse Brother        Social History:   Married. Wife is a paramedic and they have  2 grown children. 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 He reports that he has never smoked. He used to chews tobacco three times a day (a can?)  He reports that he does not drink alcohol or use drugs.      Allergies: No Known Allergies      No medications prior to admission.      Drug Regimen Review  Drug regimen was reviewed and remains appropriate with no significant issues identified   Home: Home Living Family/patient expects to be  discharged to:: Private residence Living Arrangements: Spouse/significant other, Children Available Help at Discharge: Family, Available PRN/intermittently(family is going to try to be with him 24/ 7 if possible) Type of Home: House Home Access: Stairs to enter [employer] of Steps: 5 Entrance Stairs-Rails: Right Home Layout: Multi-level Alternate Level Stairs-Number of Steps: 5 Alternate Level Stairs-Rails: Right Bathroom Shower/Tub: Engineer, manufacturing systems: Standard Home Equipment: Environmental consultant - 2 wheels, Bedside commode   Functional History: Prior Function Level of Independence: Independent Comments: Pt had retired as a Radiation protection practitioner about 7 years ago, 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   Functional Status:  Mobility: Bed Mobility Overal bed mobility: Needs Assistance Bed Mobility: Rolling Rolling: Mod assist Supine to sit: Mod assist, +2 for physical assistance Sit to supine: Mod assist, +2 for physical assistance General bed mobility comments: pt reaching with UE toward bed rails to (A) this session.  Difficult for pt to push up from elbow.   Transfers Overall transfer level: Needs assistance Equipment used: Ambulation equipment used Transfer via Lift Equipment: Maximove, Hydrographic surveyor Transfers: Sit to/from [employer] to Stand: From elevated surface, Total assist, +2 physical assistance, Max assist General transfer comment: Made 3 attempts to stand with [place] Dec plus.  First attempt, pt close to clearing bottom off bed.  2 other attempts pt unable to clear bottom even with different adaptations.  Ended session with Maxi move to chair.  Ambulation/Gait Ambulation/Gait assistance: Min assist Gait Distance (Feet): 30 Feet Assistive device: Rolling walker (2 wheeled) Gait Pattern/deviations: Step-through pattern, Decreased stride length General Gait Details: deferred due to severe nausea and vomiting- education focus today  Gait velocity: decreased    ADL: ADL Overall ADL's : Needs assistance/impaired Eating/Feeding: Independent, Sitting Eating/Feeding Details (indicate cue type and reason): chair position- hoyer lift to chair this session Grooming: Wash/dry hands, Moderate assistance, Sitting Grooming Details (indicate cue type and reason): pt requires (A) to complete hygiene and face shaving with electric razor .  pt fatigued and decr BP at EOB  Upper Body Bathing: Minimal assistance, Sitting, Bed level Lower Body Bathing: Total assistance Lower Body Bathing Details (indicate cue type and reason): incontience and lack of awareness Upper Body Dressing : Moderate assistance Lower Body Dressing: Maximal assistance, Bed level Toilet Transfer: Minimal assistance, Ambulation, BSC, Grab bars, RW(BSC over toilet) Toilet Transfer Details (indicate cue type and reason): Min A to power up into standing from [facility]. Toileting- Clothing Manipulation and Hygiene: Total assistance, +2 for physical assistance, Bed level Toileting - Clothing Manipulation Details (indicate cue type and reason): Min A for standing balance and managing gown like pt performed peri care after BM at toilet Functional mobility during ADLs: Min guard, Rolling walker General ADL Comments: pt in chair and hoyer lift back to bed for bed pan. pt two person (A) into lift pad and then rolling R and L to remove pad. Rn noting need for dressing on buttocks. pt requesting bed pan.  pt positioned in chair position for bed   Cognition: Cognition Overall Cognitive Status: Within Functional Limits for tasks assessed Orientation Level: Oriented X4 Cognition Arousal/Alertness: Awake/alert Behavior During Therapy: WFL for tasks assessed/performed Overall Cognitive Status: Within Functional Limits for tasks assessed General Comments: Distracted by concern NGT is almost out (awaiting RN to fix)     Blood pressure (!) 102/59, pulse 65, temperature 98.3 F (36.8 C), temperature source Oral,  resp. rate 18, height 6\' 4"  (1.93 m), weight 134.3 kg, SpO2 95 %. Physical Exam   General: No acute distress, obesity Mood and affect are appropriate Heart: Regular rate and rhythm no rubs murmurs or extra sounds Lungs: Clear to auscultation, breathing unlabored, no rales or wheezes Abdomen: Positive bowel sounds, soft nontender to palpation, nondistended Extremities: Bilateral lower extremity edema, right thigh pain to palpation Skin: No evidence of breakdown, no evidence of rash Neurologic: Cranial nerves II through XII intact, motor strength is 5/5 in bilateral deltoid, bicep, tricep, grip, 2- right and 3- left hip flexor, knee extensors, 4- bilateral ankle dorsiflexor and plantar flexor Sensory exam normal sensation to light touch in bilateral upper and lower extremities  Musculoskeletal: No joint swelling in the knees, there is bilateral lower extremity edema no ankle joint pain with range of motion No upper extremity pain with range of motion Lab Results Last 48 Hours        Results for orders placed or performed during the hospital encounter of 10/01/18 (from the past 48 hour(s))  CBC     Status: Abnormal    Collection Time: 11/04/18 12:40 PM  Result Value Ref Range    WBC 7.4 4.0 - 10.5 K/uL    RBC 2.83 (L) 4.22 - 5.81 MIL/uL    Hemoglobin 7.7 (L) 13.0 - 17.0 g/dL    HCT 01.6 (L) 55.3 - 52.0 %    MCV 89.8 80.0 - 100.0 fL    MCH 27.2 26.0 - 34.0 pg    MCHC 30.3 30.0 - 36.0 g/dL    RDW 74.8 (H) 27.0 - 15.5 %    Platelets 329 150 - 400 K/uL    nRBC 0.0 0.0 - 0.2 %      Comment: Performed at [facility], [address] [place], [place] [address]      Imaging Results (Last 48 hours)  No results found.           Medical Problem List and Plan: 1.    Deconditioning secondary to congestive heart failure 2.  Antithrombotics: -DVT/anticoagulation:  Pharmaceutical: Other (comment)--Eliquis             -antiplatelet therapy: Low-dose ASA. 3. Pain Management:  4.  Mood: We will monitor and involve neuropsychology as needed             -antipsychotic agents: None 5. Neuropsych: This patient is capable of making decisions on his own behalf. 6. Skin/Wound Care: routine pressure relief measures.  7. Fluids/Electrolytes/Nutrition: Monitor I/O. Check lytes in am.  8.  Systolic CHF/AKI: Heart healthy diet monitor weights daily.  Managed with losartan, Lasix, digoxin, Coreg and Lipitor. Monitor renal status with serial checks.  9.  Severe CAD: Aggressive medical therapy recommended at this time.  On Coreg, losartan, ASA and statin.  Off Plavix due to needing eleiquis for Afib 10.  A. fib with RVR: Monitor heart rate twice daily.  Continue digoxin, Coreg and amiodarone.  On Eliquis  11.  Right thigh hematoma with femoral plexopathy: Bledsoe brace for support.  Monitor with for recovery 12.  Upper GI bleed: Monitor H&H with serial checks.  Continue Protonix twice daily 13.  ABLA/iron deficiency anemia/folic acid deficiency: Low iron with elevated ferritin-- Add folic acid. Question IV iron for supplementation.   14.  Hyponatremia: Recheck lytes in am.          Post Admission Physician Evaluation: 1. Functional deficits secondary  to deconditioning. 2. Patient admitted to receive collaborative, interdisciplinary care between the physiatrist, rehab nursing staff, and therapy team. 3. Patient's level of medical complexity and substantial therapy needs in context of that medical necessity cannot be provided at a lesser intensity of care. 4. Patient has experienced substantial functional loss from his/her baseline.Judging by the patient's diagnosis, physical exam, and functional history, the patient has potential for functional progress which will result in measurable gains while on inpatient rehab.  These gains will be of substantial and practical use upon discharge in facilitating mobility and self-care at the household level. 5. Physiatrist will provide 24 hour  management of medical needs as well as oversight of the therapy plan/treatment and provide guidance as appropriate regarding the interaction of the two. 6. 24 hour rehab nursing will assist in the management of  bladder management, bowel management, safety, skin/wound care, disease management, medication administration, pain management and patient education  and help integrate therapy concepts, techniques,education, etc. 7. PT will assess and treat for: Gait training, Functional mobility training, Therapeutic activities, Therapeutic exercise, Patient/family education, DME instruction.  Goals are: independent with assistive device.  Cardiac precaution written 8. OT will assess and treat for Self-care/ADL training, Therapeutic exercise, Energy conservation, DME and/or AE instruction, Therapeutic activities, Patient/family education.  Goals are: independent with assistive device.  9. SLP will assess and treat for  .  Goals are: N/A. 10. Case Management and Social Worker will assess and treat for psychological issues and discharge planning. 11. Team conference will be held weekly to assess progress toward goals and to determine barriers to discharge. 12.  Patient will receive at least 3 hours of therapy per day at least 5 days per week. 13. ELOS and Prognosis: 17-21d fair  "I have personally performed a face to face diagnostic evaluation of this patient.  Additionally, I have reviewed and concur with the physician assistant's documentation above."  Erick Colace M.D. [facility] FAAPM&R (Sports Med, Neuromuscular Med) Diplomate Am Board of Electrodiagnostic Med     Jacquelynn Cree, PA-C 11/05/2018

## 2018-11-05 NOTE — Progress Notes (Signed)
Cardiac Rehab Advisory Cardiac Rehab Phase I is not seeing pts face to face at this time due to Covid 19 restrictions. Ambulation is occurring through nursing, PT, and mobility teams. We will help facilitate that process as needed. We are calling pts in their rooms and discussing education. We will then deliver education materials to pts RN for delivery to pt.   Spoke with pt on phone. Discussed HF booklet in depth. Pt voiced understanding of daily wts, zones, taking meds, and low sodium diet. Also discussed fluid restrictions. Encouraged pt to continue working on therapy/mobility. We discussed CRPII for "down the road" and I will give pt a brochure for [place] CRPII. Will give HF booklet, HF video to watch and low sodium diet handouts. Good reception, pt appreciative. [phone] Ethelda Chick CES, ACSM 2:52 PM 11/05/2018

## 2018-11-05 NOTE — Progress Notes (Signed)
Inpatient Rehab Admissions Coordinator:   I have MD approval from Dr. Rhona Leavens and am able to admit pt to CIR today.  I have informed the patient, the CM, and the RN.    Estill Dooms, PT, DPT Admissions Coordinator [phone] 11/05/18  4:09 PM

## 2018-11-05 NOTE — Progress Notes (Signed)
Stephania Fragmin, PT  Rehab Admission Coordinator  Physical Medicine and Rehabilitation  PMR Pre-admission  Signed  Date of Service:  11/04/2018 1:03 PM       Related encounter: ED to Hosp-Admission (Current) from 10/01/2018 in [facility]      Signed         Show:Clear all Manual[x] Template[x] Copied  Added by: Stephania Fragmin, PT  Hover for details PMR Admission Coordinator Pre-Admission Assessment  Patient: Aaron [name] is an 53 y.o., male MRN: [id number] DOB: 26-Aug-1965 Height:  (193 cm) Weight: 134.3 kg                                                                                                                                                  Insurance Information HMO:     PPO:      PCP:      IPA:      80/20:      OTHER:  PRIMARY: Uninsured    Medicaid Application Date: 10/16/2018      Case Manager: Amanda Cockayne - [email or web address] - [phone] Disability Application Date:       Case Worker:   The "Data Collection Information Summary" for patients in Inpatient Rehabilitation Facilities with attached "Privacy Act Statement-Health Care Records" was provided and verbally reviewed with: N/A  Emergency Contact Information         Contact Information    Name Relation Home Work Mobile   [facility] Spouse [phone]     [name], [name] [phone]       Current Medical History  Patient Admitting Diagnosis: Debility  History of Present Illness: Aaron [name] a 53 y.o.malewho was admitted to outside hospital on3/5/2020with 3-4 weeks with abdominal pain, peripheral edema and SOB.He was noted to haveanasarcawith abnormal LFTs, AKI and RLL effusion with concerns of PNA due to leucocytosis. Abdominal ultrasound revealed cholelithiasis with fatty liver and liver doppler negativefor thrombosis. 2 D echo done revealing diffuse hypokinesis withejection fraction of15%. Dr. Cristal Deer  consulted for input and and felt that AKI/ elevated LFTs likely due to congestion. He underwent cardiac cath on 3/13/2020by Dr. 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 He developed A fib with RVRand placed on IV amiodarone for rate control. Patient not felt to be a candidate for CABG per Dr. Dorris Fetch and being treated medically. Plans for PCI of LAD/LCx were cancelled following large hematoma to R thigh on heparin therapy.  Heparin discontinued and hematoma improving.  Hospital course complicated by ileus, heart rate control, hypotension, and hematoma. Therapy ongoing and patient limited by BP and weakness.  Recommendations for CIR were made.   Glasgow Coma Scale Score: 15  Past Medical History      Past Medical History:  Diagnosis Date  .  Morbid obesity (HCC)     Family History  family history includes Alcoholism in his father; Diabetes in his mother; Drug abuse in his brother; Rheumatic fever in his mother.  Prior Rehab/Hospitalizations:  Has the patient had prior rehab or hospitalizations prior to admission? No  Has the patient had major surgery during 100 days prior to admission? No  Current Medications   Current Facility-Administered Medications:  .  0.9 %  sodium chloride infusion (Manually program via Guardrails IV Fluids), , Intravenous, Once, Rizwan, Saima, MD .  0.9 %  sodium chloride infusion (Manually program via Guardrails IV Fluids), , Intravenous, Once, Rizwan, Saima, MD .  0.9 %  sodium chloride infusion, 250 mL, Intravenous, PRN, Kerin SalenKarki, Arya, MD, Last Rate: 10 mL/hr at 11/03/18 1346, 20 mL at 11/03/18 1346 .  acetaminophen (TYLENOL) tablet 650 mg, 650 mg, Oral, Q4H PRN, Kerin SalenKarki, Arya, MD .  alum & mag hydroxide-simeth (MAALOX/MYLANTA) 200-200-20 MG/5ML suspension 30 mL, 30 mL, Oral, Q4H PRN, Kerin SalenKarki, Arya, MD, 30 mL at 10/16/18 0552 .   amiodarone (PACERONE) tablet 200 mg, 200 mg, Oral, BID, Rizwan, Saima, MD, 200 mg at 11/05/18 0833 .  apixaban (ELIQUIS) tablet 5 mg, 5 mg, Oral, BID, Earnie LarssonWilson, Frank R, RPH, 5 mg at 11/05/18 [id number] .  aspirin EC tablet 81 mg, 81 mg, Oral, Daily, Bensimhon, Bevelyn Bucklesaniel R, MD, 81 mg at 11/05/18 [id number] .  carvedilol (COREG) tablet 3.125 mg, 3.125 mg, Oral, BID WC, Bensimhon, Bevelyn Bucklesaniel R, MD, 3.125 mg at 11/05/18 0834 .  digoxin (LANOXIN) tablet 0.125 mg, 0.125 mg, Oral, Daily, Bensimhon, Bevelyn Bucklesaniel R, MD, 0.125 mg at 11/05/18 [id number] .  feeding supplement (ENSURE ENLIVE) (ENSURE ENLIVE) liquid 237 mL, 237 mL, Oral, TID BM, Noralee Stainhoi, Jennifer, DO, 237 mL at 11/05/18 1303 .  folic acid injection 1 mg, 1 mg, Intravenous, Daily, Rizwan, Saima, MD, 1 mg at 11/05/18 0834 .  furosemide (LASIX) tablet 40 mg, 40 mg, Oral, Daily, Jerald Kiefhiu, Stephen K, MD, 40 mg at 11/05/18 [id number] .  losartan (COZAAR) tablet 25 mg, 25 mg, Oral, Daily, Bensimhon, Bevelyn Bucklesaniel R, MD, 25 mg at 11/05/18 0834 .  MEDLINE mouth rinse, 15 mL, Mouth Rinse, BID, Kerin SalenKarki, Arya, MD, 15 mL at 11/05/18 0835 .  nicotine (NICODERM CQ - dosed in mg/24 hours) patch 14 mg, 14 mg, Transdermal, Daily, Rizwan, Saima, MD, 14 mg at 11/05/18 0835 .  ondansetron (ZOFRAN) injection 4 mg, 4 mg, Intravenous, Q6H PRN, Kerin SalenKarki, Arya, MD, 4 mg at 10/27/18 1547 .  pantoprazole (PROTONIX) EC tablet 40 mg, 40 mg, Oral, BID AC, Rizwan, Saima, MD, 40 mg at 11/05/18 0610 .  polyethylene glycol (MIRALAX / GLYCOLAX) packet 17 g, 17 g, Oral, Daily PRN, Rizwan, Saima, MD .  rosuvastatin (CRESTOR) tablet 20 mg, 20 mg, Oral, q1800, Calvert Cantorizwan, Saima, MD, 20 mg at 11/04/18 2034 .  sodium chloride (OCEAN) 0.65 % nasal spray 1 spray, 1 spray, Each Nare, PRN, Calvert Cantorizwan, Saima, MD, 1 spray at 11/01/18 1051 .  sodium chloride flush (NS) 0.9 % injection 10-40 mL, 10-40 mL, Intracatheter, Q12H, Kerin SalenKarki, Arya, MD, 10 mL at 11/05/18 0836 .  sodium chloride flush (NS) 0.9 % injection 10-40 mL, 10-40 mL, Intracatheter, PRN, Kerin SalenKarki,  Arya, MD, 10 mL at 11/04/18 1520 .  sodium chloride flush (NS) 0.9 % injection 3 mL, 3 mL, Intravenous, Q12H, Kerin SalenKarki, Arya, MD, 3 mL at 11/05/18 0836 .  sodium chloride flush (NS) 0.9 % injection 3 mL, 3 mL, Intravenous, PRN, Kerin SalenKarki, Arya, MD .  sodium phosphate (FLEET) 7-19  GM/118ML enema 1 enema, 1 enema, Rectal, Daily PRN, Calvert Cantor, MD .  witch hazel-glycerin (TUCKS) pad, , Topical, PRN, Kerin Salen, MD  Patients Current Diet:     Diet Order                  Diet regular Room service appropriate? Yes; Fluid consistency: Thin  Diet effective now         Diet - low sodium heart healthy         Diet - low sodium heart healthy               Precautions / Restrictions Precautions Precautions: Fall Precaution Comments: Watch BP; VitalGo tilt bed Other Brace: RLE Bledsoe brace locked in extension when up- okay to unlock at EOB  Restrictions Weight Bearing Restrictions: Yes RLE Weight Bearing: Weight bearing as tolerated   Has the patient had 2 or more falls or a fall with injury in the past year?No  Prior Activity Level Community (5-7x/wk): for 1 month PTA pt had noticed a decline in his health/mobility, but prior to that he was very active and running the household  Prior Functional Level Prior Function Level of Independence: Independent Comments: Pt had retired as a Radiation protection practitioner about 7 years ago, 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  Independent  Indoor Mobility: Did the patient need assistance with walking from room to room (with or without device)? Independent  Stairs: Did the patient need assistance with internal or external stairs (with or without device)? Independent  Functional Cognition: Did the patient need help planning regular tasks such as shopping or remembering to take medications? Independent  Home Assistive Devices / Equipment Home  Assistive Devices/Equipment: None Home Equipment: Walker - 2 wheels, Bedside commode  Prior Device Use: Indicate devices/aids used by the patient prior to current illness, exacerbation or injury? None of the above  Current Functional Level Cognition  Overall Cognitive Status: Within Functional Limits for tasks assessed Orientation Level: Oriented X4 General Comments: Distracted by concern NGT is almost out (awaiting RN to fix)    Extremity Assessment (includes Sensation/Coordination)  Upper Extremity Assessment: Generalized weakness  Lower Extremity Assessment: Defer to PT evaluation    ADLs  Overall ADL's : Needs assistance/impaired Eating/Feeding: Independent, Sitting Eating/Feeding Details (indicate cue type and reason): chair position- hoyer lift to chair this session Grooming: Wash/dry hands, Moderate assistance, Sitting Grooming Details (indicate cue type and reason): pt requires (A) to complete hygiene and face shaving with electric razor .  pt fatigued and decr BP at EOB  Upper Body Bathing: Minimal assistance, Sitting, Bed level Lower Body Bathing: Total assistance Lower Body Bathing Details (indicate cue type and reason): incontience and lack of awareness Upper Body Dressing : Moderate assistance Lower Body Dressing: Maximal assistance, Bed level Toilet Transfer: Minimal assistance, Ambulation, BSC, Grab bars, RW(BSC over toilet) Toilet Transfer Details (indicate cue type and reason): Min A to power up into standing from [facility]. Toileting- Clothing Manipulation and Hygiene: Total assistance, +2 for physical assistance, Bed level Toileting - Clothing Manipulation Details (indicate cue type and reason): Min A for standing balance and managing gown like pt performed peri care after BM at toilet Functional mobility during ADLs: Min guard, Rolling walker General ADL Comments: pt in chair and hoyer lift back to bed for bed pan. pt two person (A) into lift pad and then rolling R  and L to  remove pad. Rn noting need for dressing on buttocks. pt requesting bed pan. pt positioned in chair position for bed    Mobility  Overal bed mobility: Needs Assistance Bed Mobility: Rolling Rolling: Mod assist Supine to sit: Mod assist, +2 for physical assistance Sit to supine: Mod assist, +2 for physical assistance General bed mobility comments: pt reaching with UE toward bed rails to (A) this session. also able to bend up left LE for rollig but assist to bend right LE. Was on bedpan and PT assisted pt with rolling to get bedpan out and clean pt.  Then placed pad to get pt oob.     Transfers  Overall transfer level: Needs assistance Equipment used: Ambulation equipment used Transfer via Lift Equipment: Maximove Transfers: Sit to/from Stand Sit to Stand: From elevated surface, Total assist, +2 physical assistance, Max assist General transfer comment: Pt agreed to get cleaned up and get OOB.  Assisted NT with cleaning and then MAximove to get pt up to chair.  Pt to sit up for 2 hours through llunch.     Ambulation / Gait / Stairs / Wheelchair Mobility  Ambulation/Gait Ambulation/Gait assistance: Editor, commissioning (Feet): 30 Feet Assistive device: Rolling walker (2 wheeled) Gait Pattern/deviations: Step-through pattern, Decreased stride length General Gait Details: deferred due to severe nausea and vomiting- education focus today  Gait velocity: decreased    Posture / Balance Dynamic Sitting Balance Sitting balance - Comments: Pt sat EOB x 20 minutes with UE support and in [place] plus strapped in. Attempted to work on scooting with pt at EOB, but pt unable to shift weight enough to perform lateral and AP scooting this session.  had to use the pad to move pt.  Balance Overall balance assessment: Needs assistance Sitting-balance support: Bilateral upper extremity supported Sitting balance-Leahy Scale: Fair Sitting balance - Comments: Pt sat EOB x 20 minutes with UE  support and in [place] plus strapped in. Attempted to work on scooting with pt at EOB, but pt unable to shift weight enough to perform lateral and AP scooting this session.  had to use the pad to move pt.  Standing balance support: During functional activity, Bilateral upper extremity supported Standing balance-Leahy Scale: Zero Standing balance comment: Pt able to clear buttocks barely off bed with [place] Dec plus.     Special needs/care consideration BiPAP/CPAP no CPM no Continuous Drip IV  Dialysis no        Days n/a Life Vest no Oxygen 2L/min via [place] Special Bed tilt bed Trach Size no Wound Vac (area) no      Location n/a Skin abrasion to R leg, ecchymosis to BUEs, MASD to buttocks, petechiae to BLEs, skin tear to R buttocks                              Bowel mgmt: incontinent, last BM 11/03/2018 Bladder mgmt: continent Diabetic mgmt none Behavioral consideration no Chemo/radiation no     Previous Home Environment (from acute therapy documentation) Living Arrangements: Spouse/significant other, Children Available Help at Discharge: Family, Available PRN/intermittently(family is going to try to be with him 24/ 7 if possible) Type of Home: House Home Layout: Multi-level Alternate Level Stairs-Rails: Right Alternate Level Stairs-Number of Steps: 5 Home Access: Stairs to enter Entrance Stairs-Rails: Right Entrance Stairs-Number of Steps: 5 Bathroom Shower/Tub: Engineer, manufacturing systems: Standard Home Care Services: No  Discharge Living Setting Plans for Discharge Living Setting: Patient's home Type of  Home at Discharge: House Discharge Home Layout: One level Discharge Home Access: Stairs to enter Entrance Stairs-Rails: Right Entrance Stairs-Number of Steps: 5 Discharge Bathroom Shower/Tub: Tub/shower unit Discharge Bathroom Toilet: Standard Discharge Bathroom Accessibility: Yes How Accessible: Accessible via walker Does the patient have any problems obtaining your  medications?: No  Social/Family/Support Systems Patient Roles: Spouse, Parent Anticipated Caregiver: Pt's wife, Elita Quick, and their adult children Anticipated Caregiver's Contact Information: [phone] Ability/Limitations of Caregiver: [name] works as a Radiation protection practitioner, but they plan to involve their children in pt's care at d/c if needed Caregiver Availability: 24/7 Discharge Plan Discussed with Primary Caregiver: Yes Is Caregiver In Agreement with Plan?: Yes Does Caregiver/Family have Issues with Lodging/Transportation while Pt is in Rehab?: No   Goals/Additional Needs Patient/Family Goal for Rehab: PT/OT supervision-min assist Expected length of stay: 17-21 days Cultural Considerations: n/a Dietary Needs: regular thin Equipment Needs: tbd Pt/Family Agrees to Admission and willing to participate: Yes Program Orientation Provided & Reviewed with Pt/Caregiver Including Roles  & Responsibilities: Yes   Possible need for SNF placement upon discharge: not anticipated   Patient Condition: This patient's medical and functional status has changed since the consult dated: 10/15/2018 in which the Rehabilitation Physician determined and documented that the patient's condition is appropriate for intensive rehabilitative care in an inpatient rehabilitation facility. See "History of Present Illness" (above) for medical update. Functional changes are: pt currently total +2 for mobility. Patient's medical and functional status update has been discussed with the Rehabilitation physician and patient remains appropriate for inpatient rehabilitation. Will admit to inpatient rehab today.  Preadmission Screen Completed By:  Stephania Fragmin, PT, 11/05/2018 3:46 PM ______________________________________________________________________   Discussed status with Dr. Wynn Banker on 11/05/18 at 3:46 PM and received approval for admission today.  Admission Coordinator:  Stephania Fragmin, time 3:46 PM Dorna Bloom 11/05/18           Cosigned by: Erick Colace, MD at 11/05/2018 4:44 PM  Revision History

## 2018-11-05 NOTE — Discharge Summary (Addendum)
Physician Discharge Summary  Aaron [name] ZOX:[id number] DOB: [birth year] DOA: 10/01/2018  PCP: [facility] [place]  Admit date: 10/01/2018 Discharge date: 11/05/2018  Admitted From: Home Disposition:  CIR  Recommendations for Outpatient Follow-up:  1. Follow up with PCP in 1-2 weeks when discharged 2. f/u with Dr Marca Ancona in 2-3 months 3. Recommend daily weights and notify MD if weight increases 5 pounds in 7 days. May need additional diuresis.  Wt on discharge: 134.2kg  Discharge Condition:Improved CODE STATUS:Full Diet recommendation: Heart healthy   Brief/Interim Summary: 53 y.o.malewith significant past medical history other than obesity who previously worked as a Radiation protection practitioner presented to [facility] on March 5 with anasarca and 100 pound weight gain. In the ED patient was noted to be tachycardic, labs revealed AKI with BUN of 53, creatinine 1.4, his LFTs were slightly elevated in 200s and BNP was elevated at 2769. CT abdomen/pelvis showed diffuse anasarca, ascites and small bilateral pleural effusions.  Patient was admitted for further work-up of anasarca. Echocardiogram revealedEF of 15% with diffuse hypokinesis with left atrial moderately dilated, moderate MR and mild AR. Patient was managed with Coreg and Lasix IV/milrinone drip/Spironolactone.   Hospital course was complicated byA. fib with RVRrequiring heparin drip.  Patient subsequently underwent cardiac cath on March 13 which revealed severe three-vessel CAD (Moderate mid LAD disease, tight distal lesion beyond reach of IMA. Severe disease in circumflex. RCA totally occluded).PCI of LAD/LCx versus CABG was considered but cardiothoracic surgery evaluated patient and did not feel he is candidate for CABG, recommended aggressive medical therapy.   On March 18 patient developed pain in his right thigh and noticed a hard swelling for which CT lower extremity was obtained revealing a hematoma.    Hospital course also complicated by upper GI bleed around the same time prompting GI evaluation- EGD on 3/22revealing small esophageal ulcers, gastritis.  Subsequently found to have an adynamic ileus/bowel obstruction. NG tube placed. Patient transferred back to [facility] due to ongoing multiple medical conditions.   Discharge Diagnoses:  Principal Problem:   Acute systolic CHF (congestive heart failure) (HCC) Active Problems:   Anasarca   AKI (acute kidney injury) (HCC)   Acute hypoxemic respiratory failure (HCC)   Morbid obesity (HCC)   Atrial fibrillation with rapid ventricular response (HCC)   Hypokalemia   Protein-calorie malnutrition, severe   Hematoma of right thigh   Acute blood loss anemia   Tobacco abuse   Gastritis with bleeding   Esophagitis, [place] grade C   Iron deficiency anemia   Orthostatic hypotension   Pressure injury of skin  Principal Problem: Acute systolic CHF (congestive heart failure) / Anasarca - EF 10-15 % - Continued on daily Aldactone 25 mg and maintaining weight/ fluid balance,Losartan and Coreg - down 3kg over previous 24hrs - Suspect component of current debility is related to underlying heart failure  Active Problems: Hypotension - Currently NOT orthostatic -SBP stable in the mid 90's to low 100's. Likely lower secondary to profoundly low EF - No signs of acute blood loss at this time - Given IV iron for iron deficiency -Orthostatics from 4/7 reviewed. Pt not orthostatic by vital signs. Suspect dizziness/decreased tolerance secondary to deconditioning (bedbound for over 30 days) in the setting of markedly reduced EF. Would benefit from intensive therapy at this point. Medically stable for rehab at this time  Acute blood loss anemia Upper GI bleed - EGD 3/22 > gastritis and [place] grade C esophagitis- GI states it is safe to resume  his anticoagulation - Recommendation to f/u with Dr Marca Ancona in 2-3 months please -1 U PRBC with a  dose of Lasix given on 4/2 -hemodynamically stable currently  SpontaneousRight thigh hematoma3/18 - has stabilized - Continue with PT as patient tolerates  Ileus - after GI bleed, EGD/ Colonoscopy - has resolved - NG removed 4/3- advance to solids 4/4 - Stable and tolerating diet at this time  Hypokalemia - replaced, stable at this time  A-fib/ NSVT - GI feels comfortable with resuming anticoagulation- hematoma has been stable as well- - resumed Eliquis on 4/3  - Amiodarone switched to oral on 4/3at a dose of200 BID- typicallyshould be tirated down in 1 - 2 wks - will need cards to f/u for this - continue on digoxin as tolerated  Morbid obesity Body mass index is 36.09 kg/m. - suggest diet/lifestyle modification  Tobacco abuse - weaned nicotine patch from 21 to 14 mg - Remains stable at this time  Dry nose with blood clots - remove O2- use saline nasal spray - Currently stable  Stage 2 pressure injury to nare secondary to NG tube  Discharge Instructions  Discharge Instructions    Diet - low sodium heart healthy   Complete by:  As directed    Diet - low sodium heart healthy   Complete by:  As directed    Increase activity slowly   Complete by:  As directed    Increase activity slowly   Complete by:  As directed      Allergies as of 11/05/2018   No Known Allergies     Medication List    TAKE these medications   acetaminophen 325 MG tablet Commonly known as:  TYLENOL Take 2 tablets (650 mg total) by mouth every 4 (four) hours as needed for headache or mild pain.   alum & mag hydroxide-simeth 200-200-20 MG/5ML suspension Commonly known as:  MAALOX/MYLANTA Take 30 mLs by mouth every 4 (four) hours as needed for indigestion or heartburn.   amiodarone 200 MG tablet Commonly known as:  PACERONE Take 1 tablet (200 mg total) by mouth 2 (two) times daily.   apixaban 5 MG Tabs tablet Commonly known as:  ELIQUIS Take 1 tablet (5 mg total) by mouth  2 (two) times daily.   atorvastatin 80 MG tablet Commonly known as:  Lipitor Take 1 tablet (80 mg total) by mouth daily.   carvedilol 3.125 MG tablet Commonly known as:  COREG Take 1 tablet (3.125 mg total) by mouth 2 (two) times daily with a meal for 30 days.   digoxin 0.125 MG tablet Commonly known as:  LANOXIN Take 1 tablet (0.125 mg total) by mouth daily.   furosemide 40 MG tablet Commonly known as:  LASIX Take 1 tablet (40 mg total) by mouth daily for 30 days. Start taking on:  November 06, 2018   losartan 25 MG tablet Commonly known as:  COZAAR Take 1 tablet (25 mg total) by mouth daily.   nicotine 14 mg/24hr patch Commonly known as:  NICODERM CQ - dosed in mg/24 hours Place 1 patch (14 mg total) onto the skin daily.   pantoprazole 40 MG tablet Commonly known as:  Protonix Take 1 tablet (40 mg total) by mouth 2 (two) times daily before a meal.   polyethylene glycol 17 g packet Commonly known as:  MIRALAX / GLYCOLAX Take 17 g by mouth 2 (two) times daily.   rosuvastatin 20 MG tablet Commonly known as:  CRESTOR Take 1 tablet (20 mg total)  by mouth daily at 6 PM for 30 days.      Follow-up Information    [facility] [place]. Schedule an appointment as soon as possible for a visit in 1 week(s).   Specialty:  Family Medicine Contact information: [address] [place] [address] [phone]        Jodelle Red, MD .   Specialty:  Cardiology Contact information: [address] [place] [address] [phone]          No Known Allergies  Consultations:  Cardiology  Cardiothoracic surgery  Orthopedic surgery  GI  Procedures/Studies: Dg Abd 1 View  Result Date: 10/27/2018 CLINICAL DATA:  Ileus.  Morbid obesity. EXAM: ABDOMEN - 1 VIEW COMPARISON:  10/26/2018 FINDINGS: Nasogastric tube is again seen with tip overlying the distal stomach. Diffuse dilatation of the colon shows no significant  change, most likely due to chronic ileus. IMPRESSION: Probable colonic ileus, without significant change. Electronically Signed   By: Myles Rosenthal M.D.   On: 10/27/2018 19:19   Dg Abd 1 View  Result Date: 10/26/2018 CLINICAL DATA:  Ileus EXAM: ABDOMEN - 1 VIEW COMPARISON:  10/24/2018; 10/22/2018; 10/19/2018; CT abdomen pelvis-10/19/2018 FINDINGS: Enteric tube tip and side port overlies the expected location of the gastric antrum. Grossly unchanged marked gaseous distention of the colon with index loop of colon within the left mid hemiabdomen measuring approximately 7.8 cm in diameter. No change to slight improvement of gaseous distention of several loops of small bowel within the left mid hemiabdomen with index loop measuring approximately 4.6 cm with potential bowel wall thickening. No pneumoperitoneum, pneumatosis or portal venous gas. No acute osseous abnormalities. IMPRESSION: Grossly unchanged findings most suggestive of ileus. Electronically Signed   By: Simonne Come M.D.   On: 10/26/2018 08:14   Dg Abd 1 View  Result Date: 10/24/2018 CLINICAL DATA:  Ileus. EXAM: ABDOMEN - 1 VIEW COMPARISON:  Radiographs of October 22, 2018. FINDINGS: Stable position of nasogastric tube with tip in expected position of distal stomach. Stable colonic dilatation is noted suggesting ileus. Minimal to mild small bowel dilatation is seen in the left side of the abdomen. IMPRESSION: Stable position of the nasogastric tube. Grossly stable colonic and small bowel dilatation is noted concerning for ileus. Electronically Signed   By: Lupita Raider, M.D.   On: 10/24/2018 08:29   Dg Abd 1 View  Result Date: 10/22/2018 CLINICAL DATA:  Small-bowel obstruction. EXAM: ABDOMEN - 1 VIEW COMPARISON:  10/19/2018; 10/17/2018; CT abdomen pelvis-10/19/2018 FINDINGS: Enteric tube tip and side port project over the expected location of the gastric antrum. Grossly unchanged moderate to marked gaseous distention of several loops of large and  small bowel with index loop of small bowel within the right mid hemiabdomen measuring 6.4 cm in diameter, grossly unchanged. Nondiagnostic evaluation for pneumoperitoneum secondary to exclusion of the lower thorax. No definite pneumatosis or portal venous gas. No acute osseous abnormalities. IMPRESSION: Similar findings most suggestive of ileus. Electronically Signed   By: Simonne Come M.D.   On: 10/22/2018 08:05   Dg Abd 1 View  Result Date: 10/19/2018 CLINICAL DATA:  Vomiting, abdominal distention. EXAM: ABDOMEN - 1 VIEW COMPARISON:  Radiographs of October 17, 2018. FINDINGS: Stable small bowel dilatation is noted concerning for distal small bowel obstruction or possibly ileus. Possible right colonic dilatation is noted. Distal colon is nondilated. No radio-opaque calculi or other significant radiographic abnormality are seen. IMPRESSION: Small bowel dilatation is noted concerning for distal small bowel  obstruction or possibly ileus. Continued radiographic follow-up is recommended. Possible right colonic dilatation is noted. Electronically Signed   By: Lupita Raider, M.D.   On: 10/19/2018 07:26   Dg Abd 1 View  Result Date: 10/17/2018 CLINICAL DATA:  Small bowel obstruction. EXAM: ABDOMEN - 1 VIEW COMPARISON:  10/16/2018 FINDINGS: Nasogastric tube in the upper abdomen and appears to be in the stomach body region. Again noted are dilated loops of small and large bowel. Bowel gas distension is similar to the previous examination. Limited evaluation for free air on these supine images. Stool in the rectum. IMPRESSION: Stable dilatation of small and large bowel loops. Differential diagnosis includes an ileus versus colonic obstruction. Nasogastric tube appears to be in the stomach. Electronically Signed   By: Richarda Overlie M.D.   On: 10/17/2018 09:19   Dg Abd 1 View  Result Date: 10/16/2018 CLINICAL DATA:  Nasogastric tube placement EXAM: ABDOMEN - 1 VIEW COMPARISON:  October 16, 2018 abdominal radiographs  obtained earlier in the day FINDINGS: Nasogastric tube tip is at the gastroesophageal junction. There remains dilated bowel. No free air evident. IMPRESSION: Nasogastric tube tip at gastroesophageal junction. Advise advancing nasogastric tube approximately 8 cm to insure that tip and side port are well within the stomach. Persistent bowel dilatation noted. These results will be called to the ordering clinician or representative by the Radiology Department at the imaging location. Electronically Signed   By: Bretta Bang III M.D.   On: 10/16/2018 13:34   Dg Abd 1 View  Result Date: 10/16/2018 CLINICAL DATA:  Hematemesis.  Distention. EXAM: ABDOMEN - 1 VIEW COMPARISON:  10/10/2018. FINDINGS: Prominently distended loops of small and large bowel are noted. Stool noted in the colon including the rectum. Findings most consistent adynamic ileus. Small or large bowel obstruction can not be excluded. Close follow-up exams suggested to demonstrate resolution. Pelvic calcifications consistent phleboliths. Degenerative change lumbar spine. IMPRESSION: Probably distended loops of small and large bowel noted. Stool noted in the colon including the rectum. Findings most consistent with adynamic ileus. Small or large bowel obstruction can not be excluded. Close follow-up exam suggested demonstrate resolution. Electronically Signed   By: Maisie Fus  Register   On: 10/16/2018 08:58   Ct Abdomen Pelvis W Contrast  Result Date: 10/19/2018 CLINICAL DATA:  Small bowel obstruction. EXAM: CT ABDOMEN AND PELVIS WITH CONTRAST TECHNIQUE: Multidetector CT imaging of the abdomen and pelvis was performed using the standard protocol following bolus administration of intravenous contrast. CONTRAST:  OMNIPAQUE IOHEXOL 300 MG/ML  SOLN COMPARISON:  CT scan of October 01, 2018. FINDINGS: Lower chest: Moderate right pleural effusion is noted with adjacent right lower lobe atelectasis. Hepatobiliary: No focal liver abnormality is seen. No  gallstones, gallbladder wall thickening, or biliary dilatation. Pancreas: Unremarkable. No pancreatic ductal dilatation or surrounding inflammatory changes. Spleen: Normal in size without focal abnormality. Adrenals/Urinary Tract: Adrenal glands appear normal. Large right renal cyst is noted. No hydronephrosis or renal obstruction is noted. No renal or ureteral calculi are noted. Urinary bladder is unremarkable. Stomach/Bowel: Stomach appears normal. Proximal small bowel dilatation is noted concerning for distal small bowel obstruction or possibly ileus. No definite transition zone is noted. The appendix is not visualized, although no inflammation is noted in the right lower quadrant. Mild wall thickening of the rectum is noted suggesting possible inflammation. Vascular/Lymphatic: Aortic atherosclerosis. No enlarged abdominal or pelvic lymph nodes. Reproductive: Prostate is unremarkable. Other: No abdominal wall hernia or abnormality. No abdominopelvic ascites. Musculoskeletal: No acute or significant  osseous findings. IMPRESSION: Proximal small bowel dilatation is noted concerning for distal small bowel obstruction or possibly ileus. No definite focal transition zone is noted. Mild wall thickening of the rectum is noted suggesting possible inflammation. Moderate right pleural effusion with adjacent right lower lobe subsegmental atelectasis. Aortic Atherosclerosis (ICD10-I70.0). Electronically Signed   By: Lupita Raider, M.D.   On: 10/19/2018 13:24   Ct Tibia Fibula Right Wo Contrast  Result Date: 10/15/2018 CLINICAL DATA:  Burning, cramping, numbness on right upper leg. EXAM: CT OF THE RIGHT FEMUR WITHOUT CONTRAST CT OF THE RIGHT TIBIA AND FIBULA WITHOUT CONTRAST CT OF THE RIGHT FOOT WITHOUT CONTRAST TECHNIQUE: Multidetector CT imaging of the right lower extremity was performed according to the standard protocol. COMPARISON:  None. FINDINGS: Bones/Joint/Cartilage FEMUR: No fracture or dislocation. Normal  alignment. No joint effusion. No aggressive osseous lesion. TIBIA AND FIBULA: No fracture or dislocation. Normal alignment. No joint effusion. No aggressive osseous lesion. FOOT: No fracture or dislocation. Normal alignment. No joint effusion. No aggressive osseous lesion. Ligaments Ligaments are suboptimally evaluated by CT. Muscles and Tendons Expansion of the right rectus femoris muscle with an intramuscular hematoma measuring 6.7 x 5.7 x 2.5 cm. Remainder the muscles are normal. No other intramuscular fluid collection or hematoma. No muscle atrophy. Quadriceps tendon and patellar tendon are intact. Flexor, extensor, peroneal and Achilles tendons are intact. Soft tissue No fluid collection or hematoma. No soft tissue mass. Generalized soft tissue edema around the right foot likely reactive. Mild soft tissue edema around the right knee. IMPRESSION: 1. Large intramuscular hematoma in the right rectus femoris muscle. Electronically Signed   By: Elige Ko   On: 10/15/2018 16:57   Ct Foot Right Wo Contrast  Result Date: 10/15/2018 CLINICAL DATA:  Burning, cramping, numbness on right upper leg. EXAM: CT OF THE RIGHT FEMUR WITHOUT CONTRAST CT OF THE RIGHT TIBIA AND FIBULA WITHOUT CONTRAST CT OF THE RIGHT FOOT WITHOUT CONTRAST TECHNIQUE: Multidetector CT imaging of the right lower extremity was performed according to the standard protocol. COMPARISON:  None. FINDINGS: Bones/Joint/Cartilage FEMUR: No fracture or dislocation. Normal alignment. No joint effusion. No aggressive osseous lesion. TIBIA AND FIBULA: No fracture or dislocation. Normal alignment. No joint effusion. No aggressive osseous lesion. FOOT: No fracture or dislocation. Normal alignment. No joint effusion. No aggressive osseous lesion. Ligaments Ligaments are suboptimally evaluated by CT. Muscles and Tendons Expansion of the right rectus femoris muscle with an intramuscular hematoma measuring 6.7 x 5.7 x 2.5 cm. Remainder the muscles are normal. No  other intramuscular fluid collection or hematoma. No muscle atrophy. Quadriceps tendon and patellar tendon are intact. Flexor, extensor, peroneal and Achilles tendons are intact. Soft tissue No fluid collection or hematoma. No soft tissue mass. Generalized soft tissue edema around the right foot likely reactive. Mild soft tissue edema around the right knee. IMPRESSION: 1. Large intramuscular hematoma in the right rectus femoris muscle. Electronically Signed   By: Elige Ko   On: 10/15/2018 16:57   Dg Abd Portable 1v  Result Date: 10/30/2018 CLINICAL DATA:  Ileus, NG tube EXAM: PORTABLE ABDOMEN - 1 VIEW COMPARISON:  10/27/2018 FINDINGS: NG tube remains in stable position. Gaseous distention of bowel again noted, unchanged. This likely reflects ileus as large and small bowel are involved. No free air or organomegaly. IMPRESSION: Continued diffuse gaseous distention of bowel, likely ileus. No change. Electronically Signed   By: Charlett Nose M.D.   On: 10/30/2018 07:37   Dg Abd Portable 1v  Result Date: 10/19/2018  CLINICAL DATA:  Nasogastric tube repositioning EXAM: PORTABLE ABDOMEN - 1 VIEW COMPARISON:  October 19, 2018 study obtained earlier in the day FINDINGS: Nasogastric tube tip and side port in stomach. There are loops of dilated bowel consistent with either ileus or a degree of obstruction. No free air. There is consolidation in the left base. IMPRESSION: Nasogastric tube tip and side port in stomach. Loops of dilated bowel persist. No free air. Consolidation left lung base. Electronically Signed   By: Bretta BangWilliam  Woodruff III M.D.   On: 10/19/2018 19:59   Dg Abd Portable 1v  Result Date: 10/19/2018 CLINICAL DATA:  Repositioning of nasogastric tube EXAM: PORTABLE ABDOMEN - 1 VIEW COMPARISON:  Portable exam 1644 hours compared to 1526 hours FINDINGS: Nasogastric tube coiled in distal esophagus. Air-filled distended loops of large and small bowel in the upper abdomen. Opacified LEFT lung base. Bones  demineralized. IMPRESSION: Nasogastric tube remains coiled at the distal esophagus. Electronically Signed   By: Ulyses SouthwardMark  Boles M.D.   On: 10/19/2018 17:14   Dg Abd Portable 1v  Result Date: 10/19/2018 CLINICAL DATA:  Check gastric catheter placement EXAM: PORTABLE ABDOMEN - 1 VIEW COMPARISON:  None. FINDINGS: Scattered large and small bowel gas is noted. Gastric catheter is noted coiled upon itself in the distal esophagus. It does not appear to enter the stomach. This should be withdrawn slightly and readvanced. IMPRESSION: Gastric catheter appears coiled upon itself within the distal esophagus. Electronically Signed   By: Alcide CleverMark  Lukens M.D.   On: 10/19/2018 15:46   Dg Abd Portable 1v  Result Date: 10/10/2018 CLINICAL DATA:  Abdominal pain EXAM: PORTABLE ABDOMEN - 1 VIEW COMPARISON:  CT 10/01/2018 FINDINGS: Bowel gas pattern is unremarkable. No calcification or bone findings. IMPRESSION: Negative. Electronically Signed   By: Paulina FusiMark  Shogry M.D.   On: 10/10/2018 10:48   Ct Extremity Lower Right Wo Contrast  Result Date: 10/15/2018 CLINICAL DATA:  Burning, cramping, numbness on right upper leg. EXAM: CT OF THE RIGHT FEMUR WITHOUT CONTRAST CT OF THE RIGHT TIBIA AND FIBULA WITHOUT CONTRAST CT OF THE RIGHT FOOT WITHOUT CONTRAST TECHNIQUE: Multidetector CT imaging of the right lower extremity was performed according to the standard protocol. COMPARISON:  None. FINDINGS: Bones/Joint/Cartilage FEMUR: No fracture or dislocation. Normal alignment. No joint effusion. No aggressive osseous lesion. TIBIA AND FIBULA: No fracture or dislocation. Normal alignment. No joint effusion. No aggressive osseous lesion. FOOT: No fracture or dislocation. Normal alignment. No joint effusion. No aggressive osseous lesion. Ligaments Ligaments are suboptimally evaluated by CT. Muscles and Tendons Expansion of the right rectus femoris muscle with an intramuscular hematoma measuring 6.7 x 5.7 x 2.5 cm. Remainder the muscles are normal. No  other intramuscular fluid collection or hematoma. No muscle atrophy. Quadriceps tendon and patellar tendon are intact. Flexor, extensor, peroneal and Achilles tendons are intact. Soft tissue No fluid collection or hematoma. No soft tissue mass. Generalized soft tissue edema around the right foot likely reactive. Mild soft tissue edema around the right knee. IMPRESSION: 1. Large intramuscular hematoma in the right rectus femoris muscle. Electronically Signed   By: Elige KoHetal  Patel   On: 10/15/2018 16:57   [place] Ekg Site Rite  Result Date: 10/09/2018 If Site Rite image not attached, placement could not be confirmed due to current cardiac rhythm.   Subjective: Eager to start therapy  Discharge Exam: Vitals:   11/05/18 0830 11/05/18 1528  BP: (!) 102/59 (!) 94/57  Pulse: 65 (!) 107  Resp: 18 (!) 27  Temp:  98.3 F (  36.8 C)  SpO2: 95% 94%   Vitals:   11/04/18 2134 11/05/18 0550 11/05/18 0830 11/05/18 1528  BP: 111/64 107/70 (!) 102/59 (!) 94/57  Pulse: 91 96 65 (!) 107  Resp: 15 (!) 22 18 (!) 27  Temp: 98.3 F (36.8 C) 98.3 F (36.8 C)  98.3 F (36.8 C)  TempSrc: Oral Oral  Oral  SpO2: 95% 93% 95% 94%  Weight:  134.3 kg    Height:        General: Pt is alert, awake, not in acute distress Cardiovascular: RRR, S1/S2 +, no rubs, no gallops Respiratory: CTA bilaterally, no wheezing, no rhonchi Abdominal: Soft, NT, ND, bowel sounds + Extremities: no edema, no cyanosis   The results of significant diagnostics from this hospitalization (including imaging, microbiology, ancillary and laboratory) are listed below for reference.     Microbiology: No results found for this or any previous visit (from the past 240 hour(s)).   Labs: BNP (last 3 results) Recent Labs    10/01/18 1337 10/01/18 1720  BNP 2,769.2* 2,333.1*   Basic Metabolic Panel: Recent Labs  Lab 10/31/18 0505 11/01/18 0348 11/02/18 0452  NA 134* 133* 134*  K 3.5 3.5 3.5  CL 101 100 99  CO2 26 27 27   GLUCOSE 108*  112* 105*  BUN 23* 26* 23*  CREATININE 0.89 0.87 0.88  CALCIUM 7.8* 7.8* 7.9*   Liver Function Tests: No results for input(s): AST, ALT, ALKPHOS, BILITOT, PROT, ALBUMIN in the last 168 hours. No results for input(s): LIPASE, AMYLASE in the last 168 hours. No results for input(s): AMMONIA in the last 168 hours. CBC: Recent Labs  Lab 10/31/18 0505 11/01/18 0348 11/02/18 0452 11/03/18 0456 11/04/18 1240  WBC 7.9 6.0 5.5 5.4 7.4  HGB 7.6* 7.6* 7.5* 7.4* 7.7*  HCT 24.7* 24.6* 23.7* 24.4* 25.4*  MCV 90.8 90.8 89.1 90.7 89.8  PLT 259 263 290 278 329   Cardiac Enzymes: No results for input(s): CKTOTAL, CKMB, CKMBINDEX, TROPONINI in the last 168 hours. BNP: Invalid input(s): POCBNP CBG: No results for input(s): GLUCAP in the last 168 hours. D-Dimer No results for input(s): DDIMER in the last 72 hours. Hgb A1c No results for input(s): HGBA1C in the last 72 hours. Lipid Profile No results for input(s): CHOL, HDL, LDLCALC, TRIG, CHOLHDL, LDLDIRECT in the last 72 hours. Thyroid function studies No results for input(s): TSH, T4TOTAL, T3FREE, THYROIDAB in the last 72 hours.  Invalid input(s): FREET3 Anemia work up Recent Labs    11/03/18 0456  VITAMINB12 684   Urinalysis    Component Value Date/Time   COLORURINE AMBER (A) 10/01/2018 1523   APPEARANCEUR HAZY (A) 10/01/2018 1523   LABSPEC 1.023 10/01/2018 1523   PHURINE 5.0 10/01/2018 1523   GLUCOSEU NEGATIVE 10/01/2018 1523   HGBUR SMALL (A) 10/01/2018 1523   BILIRUBINUR NEGATIVE 10/01/2018 1523   KETONESUR NEGATIVE 10/01/2018 1523   PROTEINUR NEGATIVE 10/01/2018 1523   NITRITE NEGATIVE 10/01/2018 1523   LEUKOCYTESUR NEGATIVE 10/01/2018 1523   Sepsis Labs Invalid input(s): PROCALCITONIN,  WBC,  LACTICIDVEN Microbiology No results found for this or any previous visit (from the past 240 hour(s)).  Time spent: 30 min  SIGNED:   Rickey Barbara, MD  [facility] 11/05/2018, 4:03 PM  If 7PM-7AM, please contact  night-coverage

## 2018-11-05 NOTE — Progress Notes (Signed)
PROGRESS NOTE    Aaron [name]  ZOX:[id number] DOB: March 07, 1966 DOA: 10/01/2018 PCP: [facility] [place]    Brief Narrative:  53 y.o.malewith significant past medical history other than obesity who previously worked as a Radiation protection practitioner [facility] 5 with anasarca and 100 pound weight gain. In the ED patient was noted to be tachycardic, labs revealed AKI with BUN of 53, creatinine 1.4, his LFTs were slightly elevated in 200s and BNP was elevated at 2769. CT abdomen/pelvis showed diffuse anasarca, ascites and small bilateral pleural effusions.  Patient was admitted for further work-up of anasarca. Echocardiogram revealed EF of 15% with diffuse hypokinesis with left atrial moderately dilated, moderate MR and mild AR. Patient was managed with Coreg and Lasix IV/milrinone drip/Spironolactone.   Hospital course was complicated by A. fib with RVR requiring heparin drip.  Patient subsequently underwent cardiac cath on March 13 which revealed severe three-vessel CAD (Moderate mid LAD disease, tight distal lesion beyond reach of IMA. Severe disease in circumflex. RCA totally occluded).PCI of LAD/LCx versus CABG was considered but cardiothoracic surgery evaluated patient and did not feel he is candidate for CABG, recommended aggressive medical therapy.   On March 18 patient developed pain in his right thigh and noticed a hard swelling for which CT lower extremity was obtained revealing a hematoma.   Hospital course also complicated by upper GI bleed around the same time prompting GI evaluation- EGD on 3/22revealing small esophageal ulcers, gastritis.  Subsequently found to have an adynamic ileus/bowel obstruction. NG tube placed. Patient transferred back to [facility] due to ongoing multiple medical conditions.   Assessment & Plan:   Principal Problem:   Acute systolic CHF (congestive heart failure) (HCC) Active Problems:   Anasarca   AKI (acute  kidney injury) (HCC)   Acute hypoxemic respiratory failure (HCC)   Morbid obesity (HCC)   Atrial fibrillation with rapid ventricular response (HCC)   Hypokalemia   Protein-calorie malnutrition, severe   Hematoma of right thigh   Acute blood loss anemia   Tobacco abuse   Gastritis with bleeding   Esophagitis, [place] grade C   Iron deficiency anemia   Orthostatic hypotension   Pressure injury of skin  Principal Problem:   Acute systolic CHF (congestive heart failure) /   Anasarca - EF 10-15 % - Continued on daily Aldactone 25 mg and maintaining weight/ fluid balance, Losartan and Coreg - down 3kg over previous 24hrs - Suspect component of current debility is related to underlying heart failure  Active Problems: Hypotension - Currently NOT orthostatic -SBP stable in the mid 90's to low 100's. Likely lower secondary to profoundly low EF - No signs of acute blood loss at this time - Given IV iron for iron deficiency -Orthostatics from 4/7 reviewed. Pt not orthostatic by vital signs. Suspect dizziness/decreased tolerance secondary to deconditioning (bedbound for over 30 days) in the setting of markedly reduced EF. Would benefit from intensive therapy at this point. Medically stable for rehab at this time  Acute blood loss anemia Upper GI bleed - EGD 3/22 > gastritis and [place] grade C esophagitis- GI states it is safe to resume his anticoagulation - Recommendation to f/u with Dr Marca Ancona in 2-3 months please -1 U PRBC with a dose of Lasix given on 4/2 - see above in regards to anemia panel and Iron -hemodynamically stable currently  Spontaneous Right thigh hematoma 3/18 - has stabilized - Continue with PT as patient tolerates  Ileus - after GI  bleed, EGD/ Colonoscopy - has resolved - NG removed 4/3- advance to solids 4/4 - Stable and tolerating diet at this time  Hypokalemia - replaced, stable at this time  A-fib/ NSVT - GI feels comfortable with resuming  anticoagulation- hematoma has been stable as well- - resumed Eliquis on 4/3  - Amiodarone switched to oral on 4/3 at a dose of 200 BID- typically should be tirated down in 1 - 2 wks - will need cards to f/u for this - continue on digoxin as tolerated  Morbid obesity Body mass index is 36.09 kg/m. - suggest diet/lifestyle modification   Tobacco abuse - weaned nicotine patch from 21 to 14 mg - Remains stable at this time  Dry nose with blood clots - remove O2- use saline nasal spray - Currently stable  DVT prophylaxis: SCD's Code Status: Full Family Communication: Pt in room, family not at bedside Disposition Plan: CIR pending  Consultants:   Cardiology  Cardiothoracic surgery  Orthopedic surgery  GI  Procedures:   [facility] 10/09/2018: Severe three vessel disease  PCI/CABG considered, but pt is deemed a poor candidate. Medicalltherapy only.  EGD 3/22 for GI bleed: small esophageal ulcers, gastritis.  Antimicrobials: Anti-infectives (From admission, onward)   Start     Dose/Rate Route Frequency Ordered Stop   10/01/18 1515  cefTRIAXone (ROCEPHIN) 1 g in sodium chloride 0.9 % 100 mL IVPB     1 g 200 mL/hr over 30 Minutes Intravenous  Once 10/01/18 1502 10/01/18 2311   10/01/18 1515  azithromycin (ZITHROMAX) tablet 500 mg     500 mg Oral  Once 10/01/18 1502 10/01/18 1510      Subjective: Reports feeling well. Very eager to start therapy  Objective: Vitals:   11/04/18 1500 11/04/18 2134 11/05/18 0550 11/05/18 0830  BP: 109/71 111/64 107/70 (!) 102/59  Pulse: 89 91 96 65  Resp:  15 (!) 22 18  Temp:  98.3 F (36.8 C) 98.3 F (36.8 C)   TempSrc:  Oral Oral   SpO2: 96% 95% 93% 95%  Weight:   134.3 kg   Height:        Intake/Output Summary (Last 24 hours) at 11/05/2018 1107 Last data filed at 11/04/2018 2100 Gross per 24 hour  Intake 250 ml  Output 1050 ml  Net -800 ml   Filed Weights   11/03/18 0513 11/04/18 0443 11/05/18 0550  Weight: 134.5 kg (!)  138.6 kg 134.3 kg    Examination: General exam: Awake, laying in bed, in nad Respiratory system: Normal respiratory effort, no wheezing Cardiovascular system: regular rate, s1, s2 Gastrointestinal system: Soft, nondistended, positive BS Central nervous system: CN2-12 grossly intact, strength intact Extremities: Perfused, BLE dressings applied Skin: Normal skin turgor, no notable skin lesions seen Psychiatry: Mood normal // no visual hallucinations   Data Reviewed: I have personally reviewed following labs and imaging studies  CBC: Recent Labs  Lab 10/31/18 0505 11/01/18 0348 11/02/18 0452 11/03/18 0456 11/04/18 1240  WBC 7.9 6.0 5.5 5.4 7.4  HGB 7.6* 7.6* 7.5* 7.4* 7.7*  HCT 24.7* 24.6* 23.7* 24.4* 25.4*  MCV 90.8 90.8 89.1 90.7 89.8  PLT 259 263 290 278 329   Basic Metabolic Panel: Recent Labs  Lab 10/31/18 0505 11/01/18 0348 11/02/18 0452  NA 134* 133* 134*  K 3.5 3.5 3.5  CL 101 100 99  CO2 26 27 27   GLUCOSE 108* 112* 105*  BUN 23* 26* 23*  CREATININE 0.89 0.87 0.88  CALCIUM 7.8* 7.8* 7.9*   GFR:  Estimated Creatinine Clearance: 143.6 mL/min (by C-G formula based on SCr of 0.88 mg/dL). Liver Function Tests: No results for input(s): AST, ALT, ALKPHOS, BILITOT, PROT, ALBUMIN in the last 168 hours. No results for input(s): LIPASE, AMYLASE in the last 168 hours. No results for input(s): AMMONIA in the last 168 hours. Coagulation Profile: No results for input(s): INR, PROTIME in the last 168 hours. Cardiac Enzymes: No results for input(s): CKTOTAL, CKMB, CKMBINDEX, TROPONINI in the last 168 hours. BNP (last 3 results) No results for input(s): PROBNP in the last 8760 hours. HbA1C: No results for input(s): HGBA1C in the last 72 hours. CBG: No results for input(s): GLUCAP in the last 168 hours. Lipid Profile: No results for input(s): CHOL, HDL, LDLCALC, TRIG, CHOLHDL, LDLDIRECT in the last 72 hours. Thyroid Function Tests: No results for input(s): TSH,  T4TOTAL, FREET4, T3FREE, THYROIDAB in the last 72 hours. Anemia Panel: Recent Labs    11/02/18 1130 11/03/18 0456  VITAMINB12  --  684  FOLATE 4.2*  --   FERRITIN 542*  --   TIBC 174*  --   IRON 17*  --   RETICCTPCT 2.1  --    Sepsis Labs: No results for input(s): PROCALCITON, LATICACIDVEN in the last 168 hours.  No results found for this or any previous visit (from the past 240 hour(s)).   Radiology Studies: No results found.  Scheduled Meds: . sodium chloride   Intravenous Once  . sodium chloride   Intravenous Once  . amiodarone  200 mg Oral BID  . apixaban  5 mg Oral BID  . aspirin EC  81 mg Oral Daily  . carvedilol  3.125 mg Oral BID WC  . digoxin  0.125 mg Oral Daily  . feeding supplement (ENSURE ENLIVE)  237 mL Oral TID BM  . folic acid  1 mg Intravenous Daily  . furosemide  40 mg Oral Daily  . losartan  25 mg Oral Daily  . mouth rinse  15 mL Mouth Rinse BID  . nicotine  14 mg Transdermal Daily  . pantoprazole  40 mg Oral BID AC  . rosuvastatin  20 mg Oral q1800  . sodium chloride flush  10-40 mL Intracatheter Q12H  . sodium chloride flush  3 mL Intravenous Q12H   Continuous Infusions: . sodium chloride 20 mL (11/03/18 1346)     LOS: 35 days   Rickey Barbara, MD [facility] Pager On Amion  If 7PM-7AM, please contact night-coverage 11/05/2018, 11:07 AM

## 2018-11-05 NOTE — H&P (Signed)
Physical Medicine and Rehabilitation Admission H&P °  °  °   °Chief Complaint  °Patient presents with  °• Debility   °  °  °HPI: Aaron [name] is a 53 year old male admitted on 10/01/18 with abdominal pain peripheral edema and shortness of breath.  He was noted to have anasarca with abnormal LFTs, AKI and RLL effusion with concerns of pneumonia due to leukocytosis.  Abdominal ultrasound done revealing cholelithiasis with fatty liver and liver Doppler was negative for thrombosis.  2D echo revealed diffuse hypokinesis with ejection fraction of 15%.  Dr. Christopher/GI consulted for input and felt that AKI/elevated LFTs likely due to congestion.  He underwent cardiac cath by Dr. Reeder revealing severe three-vessel CAD with global hypokinesis with LVEF 15% and moderate pulmonary hypertension.  Dr. Hendrickson did not feel patient to be a CABG candidate and medical treatment recommended.  He did develop A. fib with RVR requiring IV amiodarone for rate control and was started on heparin.  He did develop right thigh pain with quad weakness felt to be due to femoral nerve compression and Dr. Dean recommended Bledsoe brace for support when up and that hematoma would resolve with time.. °  °He also developed nausea vomiting due to adynamic ileus that improved with placement of NG tube.  He was noted to have coffee-ground fluid from NG tube and Dr.Karki /GI recommended n.p.o. with IV Protonix with monitoring H/H for further drop. He underwent EGD on 3/22 revealing esophagitis with nonbleeding esophageal ulcers and erosive gastropathy.  Clear liquids as well as supportive care recommended.  He continues to have issues with N/V as well as abdominal distention with concerns of early SBO.  GI symptoms slowly improved with tolerance of NG tube clamping and eventually removed by 4/3. He was cleared to start anticoagulation and diet slowly advanced to regular.  Acute systolic CHF compensated--aldactone changed to lasix on 4/8 due to  upward trend in weights .  H&H stable and he continues to have intermittent issues with anxiety as well as dizziness.  Patient noted to be severely deconditioned and therapy working on pre-gait measures as well as sitting at the edge of the bed.  CIR recommended due to deconditioning °  °  ° ROS  ° Denies headache °Denies chest pain shortness of breath °Denies nausea vomiting diarrhea constipation °Denies dysuria using a urinal to void °Mild constipation but had BM today °Musculoskeletal denies any back pain does have right thigh pain °Neurologic has right thigh numbness °Extremities has persistent swelling although improved °  °    °Past Medical History:  °Diagnosis Date  °• Morbid obesity (HCC)    °  °  °     °Past Surgical History:  °Procedure Laterality Date  °• ESOPHAGOGASTRODUODENOSCOPY (EGD) WITH PROPOFOL N/A 10/18/2018  °  Procedure: ESOPHAGOGASTRODUODENOSCOPY (EGD) WITH PROPOFOL;  Surgeon: Karki, Arya, MD;  Location: [facility];  Service: Gastroenterology;  Laterality: N/A;  °• RIGHT/LEFT HEART CATH AND CORONARY ANGIOGRAPHY N/A 10/09/2018  °  Procedure: RIGHT/LEFT HEART CATH AND CORONARY ANGIOGRAPHY;  Surgeon: Arida, Muhammad A, MD;  Location: [facility] INVASIVE CV LAB;  Service: Cardiovascular;  Laterality: N/A;  °  °  °     °Family History  °Problem Relation Age of Onset  °• Alcoholism Father    °• Rheumatic fever Mother    °• Diabetes Mother    °• Drug abuse Brother    °  °  °Social History:   Married. Wife is a paramedic and they have   2 grown children. 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 He reports that he has never smoked. He used to chews tobacco three times a day (a can?)  He reports that he does not drink alcohol or use drugs.  °  °  °Allergies: No Known Allergies  °  °  °No medications prior to admission.  °  °  °Drug Regimen Review ° Drug regimen was reviewed and remains appropriate with no significant issues identified °  °Home: °Home Living °Family/patient expects to be  discharged to:: Private residence °Living Arrangements: Spouse/significant other, Children °Available Help at Discharge: Family, Available PRN/intermittently(family is going to try to be with him 24/ 7 if possible) °Type of Home: House °Home Access: Stairs to enter °Entrance Stairs-Number of Steps: 5 °Entrance Stairs-Rails: Right °Home Layout: Multi-level °Alternate Level Stairs-Number of Steps: 5 °Alternate Level Stairs-Rails: Right °Bathroom Shower/Tub: Tub/shower unit °Bathroom Toilet: Standard °Home Equipment: Walker - 2 wheels, Bedside commode °  °Functional History: °Prior Function °Level of Independence: Independent °Comments: Pt had retired as a paramedic about 7 years ago, 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 °  °Functional Status:  °Mobility: °Bed Mobility °Overal bed mobility: Needs Assistance °Bed Mobility: Rolling °Rolling: Mod assist °Supine to sit: Mod assist, +2 for physical assistance °Sit to supine: Mod assist, +2 for physical assistance °General bed mobility comments: pt reaching with UE toward bed rails to (A) this session.  Difficult for pt to push up from elbow.   °Transfers °Overall transfer level: Needs assistance °Equipment used: Ambulation equipment used °Transfer via Lift Equipment: Maximove, Sara Lift °Transfers: Sit to/from Stand °Sit to Stand: From elevated surface, Total assist, +2 physical assistance, Max assist °General transfer comment: Made 3 attempts to stand with Sara plus.  First attempt, pt close to clearing bottom off bed.  2 other attempts pt unable to clear bottom even with different adaptations.  Ended session with Maxi move to chair.  °Ambulation/Gait °Ambulation/Gait assistance: Min assist °Gait Distance (Feet): 30 Feet °Assistive device: Rolling walker (2 wheeled) °Gait Pattern/deviations: Step-through pattern, Decreased stride length °General Gait Details: deferred due to severe nausea and vomiting- education focus today  °Gait velocity: decreased °   °ADL: °ADL °Overall ADL's : Needs assistance/impaired °Eating/Feeding: Independent, Sitting °Eating/Feeding Details (indicate cue type and reason): chair position- hoyer lift to chair this session °Grooming: Wash/dry hands, Moderate assistance, Sitting °Grooming Details (indicate cue type and reason): pt requires (A) to complete hygiene and face shaving with electric razor .  pt fatigued and decr BP at EOB  °Upper Body Bathing: Minimal assistance, Sitting, Bed level °Lower Body Bathing: Total assistance °Lower Body Bathing Details (indicate cue type and reason): incontience and lack of awareness °Upper Body Dressing : Moderate assistance °Lower Body Dressing: Maximal assistance, Bed level °Toilet Transfer: Minimal assistance, Ambulation, BSC, Grab bars, RW(BSC over toilet) °Toilet Transfer Details (indicate cue type and reason): Min A to power up into standing from BSC. °Toileting- Clothing Manipulation and Hygiene: Total assistance, +2 for physical assistance, Bed level °Toileting - Clothing Manipulation Details (indicate cue type and reason): Min A for standing balance and managing gown like pt performed peri care after BM at toilet °Functional mobility during ADLs: Min guard, Rolling walker °General ADL Comments: pt in chair and hoyer lift back to bed for bed pan. pt two person (A) into lift pad and then rolling R and L to remove pad. Rn noting need for dressing on buttocks. pt requesting bed pan.   pt positioned in chair position for bed °  °Cognition: °Cognition °Overall Cognitive Status: Within Functional Limits for tasks assessed °Orientation Level: Oriented X4 °Cognition °Arousal/Alertness: Awake/alert °Behavior During Therapy: WFL for tasks assessed/performed °Overall Cognitive Status: Within Functional Limits for tasks assessed °General Comments: Distracted by concern NGT is almost out (awaiting RN to fix) °  °  °Blood pressure (!) 102/59, pulse 65, temperature 98.3 °F (36.8 °C), temperature source Oral,  resp. rate 18, height 6' 4" (1.93 m), weight 134.3 kg, SpO2 95 %. °Physical Exam °  °General: No acute distress, obesity °Mood and affect are appropriate °Heart: Regular rate and rhythm no rubs murmurs or extra sounds °Lungs: Clear to auscultation, breathing unlabored, no rales or wheezes °Abdomen: Positive bowel sounds, soft nontender to palpation, nondistended °Extremities: Bilateral lower extremity edema, right thigh pain to palpation °Skin: No evidence of breakdown, no evidence of rash °Neurologic: Cranial nerves II through XII intact, motor strength is 5/5 in bilateral deltoid, bicep, tricep, grip, 2- right and 3- left hip flexor, knee extensors, 4- bilateral ankle dorsiflexor and plantar flexor °Sensory exam normal sensation to light touch in bilateral upper and lower extremities ° °Musculoskeletal: No joint swelling in the knees, there is bilateral lower extremity edema no ankle joint pain with range of motion °No upper extremity pain with range of motion °Lab Results Last 48 Hours  °      °Results for orders placed or performed during the hospital encounter of 10/01/18 (from the past 48 hour(s))  °CBC     Status: Abnormal  °  Collection Time: 11/04/18 12:40 PM  °Result Value Ref Range  °  WBC 7.4 4.0 - 10.5 K/uL  °  RBC 2.83 (L) 4.22 - 5.81 MIL/uL  °  Hemoglobin 7.7 (L) 13.0 - 17.0 g/dL  °  HCT 25.4 (L) 39.0 - 52.0 %  °  MCV 89.8 80.0 - 100.0 fL  °  MCH 27.2 26.0 - 34.0 pg  °  MCHC 30.3 30.0 - 36.0 g/dL  °  RDW 17.5 (H) 11.5 - 15.5 %  °  Platelets 329 150 - 400 K/uL  °  nRBC 0.0 0.0 - 0.2 %  °    Comment: Performed at [facility], [address] [place]  [address]  °  °  °Imaging Results (Last 48 hours)  °No results found.  ° °  °  °  °  °Medical Problem List and Plan: °1.    Deconditioning secondary to congestive heart failure °2.  Antithrombotics: °-DVT/anticoagulation:  Pharmaceutical: Other (comment)--Eliquis °            -antiplatelet therapy: Low-dose ASA. °3. Pain Management:  °4.  Mood: We will monitor and involve neuropsychology as needed °            -antipsychotic agents: None °5. Neuropsych: This patient is capable of making decisions on his own behalf. °6. Skin/Wound Care: routine pressure relief measures.  °7. Fluids/Electrolytes/Nutrition: Monitor I/O. Check lytes in am.  °8.  Systolic CHF/AKI: Heart healthy diet monitor weights daily.  Managed with losartan, Lasix, digoxin, Coreg and Lipitor. Monitor renal status with serial checks.  °9.  Severe CAD: Aggressive medical therapy recommended at this time.  On Coreg, losartan, ASA and statin.  Off Plavix due to needing eleiquis for Afib °10.  A. fib with RVR: Monitor heart rate twice daily.  Continue digoxin, Coreg and amiodarone.  On Eliquis  °11.  Right thigh hematoma with femoral plexopathy: Bledsoe brace for support.    Monitor with for recovery °12.  Upper GI bleed: Monitor H&H with serial checks.  Continue Protonix twice daily °13.  ABLA/iron deficiency anemia/folic acid deficiency: Low iron with elevated ferritin-- Add folic acid. Question IV iron for supplementation.   °14.  Hyponatremia: Recheck lytes in am.    °  °  °  °Post Admission Physician Evaluation: °1. Functional deficits secondary  to deconditioning. °2. Patient admitted to receive collaborative, interdisciplinary care between the physiatrist, rehab nursing staff, and therapy team. °3. Patient's level of medical complexity and substantial therapy needs in context of that medical necessity cannot be provided at a lesser intensity of care. °4. Patient has experienced substantial functional loss from his/her baseline.Judging by the patient's diagnosis, physical exam, and functional history, the patient has potential for functional progress which will result in measurable gains while on inpatient rehab.  These gains will be of substantial and practical use upon discharge in facilitating mobility and self-care at the household level. °5. Physiatrist will provide 24 hour  management of medical needs as well as oversight of the therapy plan/treatment and provide guidance as appropriate regarding the interaction of the two. °6. 24 hour rehab nursing will assist in the management of  bladder management, bowel management, safety, skin/wound care, disease management, medication administration, pain management and patient education  and help integrate therapy concepts, techniques,education, etc. °7. PT will assess and treat for: Gait training, Functional mobility training, Therapeutic activities, Therapeutic exercise, Patient/family education, DME instruction.  Goals are: independent with assistive device.  Cardiac precaution written °8. OT will assess and treat for Self-care/ADL training, Therapeutic exercise, Energy conservation, DME and/or AE instruction, Therapeutic activities, Patient/family education.  Goals are: independent with assistive device.  °9. SLP will assess and treat for  .  Goals are: N/A. °10. Case Management and Social Worker will assess and treat for psychological issues and discharge planning. °11. Team conference will be held weekly to assess progress toward goals and to determine barriers to discharge. °12.  Patient will receive at least 3 hours of therapy per day at least 5 days per week. °13. ELOS and Prognosis: 17-21d fair ° °"I have personally performed a face to face diagnostic evaluation of this patient.  Additionally, I have reviewed and concur with the physician assistant's documentation above." ° °Kresha Abelson E. Ari Engelbrecht M.D. °[facility] °FAAPM&R (Sports Med, Neuromuscular Med) °Diplomate Am Board of Electrodiagnostic Med ° ° °  °Pamela S Love, PA-C °11/05/2018  °  °  °  ° ° °

## 2018-11-05 NOTE — Care Management (Signed)
Eliquis- patient is uninsured. Cost will be ~$400/ month. First 30 days can be covered with manufacturer coupon.

## 2018-11-06 ENCOUNTER — Inpatient Hospital Stay (HOSPITAL_COMMUNITY): Payer: Self-pay | Admitting: Physical Therapy

## 2018-11-06 ENCOUNTER — Inpatient Hospital Stay (HOSPITAL_COMMUNITY): Payer: Self-pay | Admitting: Occupational Therapy

## 2018-11-06 LAB — CBC WITH DIFFERENTIAL/PLATELET
Abs Immature Granulocytes: 0 10*3/uL (ref 0.00–0.07)
Band Neutrophils: 4 %
Basophils Absolute: 0.1 10*3/uL (ref 0.0–0.1)
Basophils Relative: 1 %
Eosinophils Absolute: 0 10*3/uL (ref 0.0–0.5)
Eosinophils Relative: 0 %
HCT: 24.5 % — ABNORMAL LOW (ref 39.0–52.0)
Hemoglobin: 7.4 g/dL — ABNORMAL LOW (ref 13.0–17.0)
Lymphocytes Relative: 3 %
Lymphs Abs: 0.2 10*3/uL — ABNORMAL LOW (ref 0.7–4.0)
MCH: 27.5 pg (ref 26.0–34.0)
MCHC: 30.2 g/dL (ref 30.0–36.0)
MCV: 91.1 fL (ref 80.0–100.0)
Monocytes Absolute: 0.8 10*3/uL (ref 0.1–1.0)
Monocytes Relative: 10 %
Neutro Abs: 6.9 10*3/uL (ref 1.7–7.7)
Neutrophils Relative %: 82 %
Platelets: 304 10*3/uL (ref 150–400)
RBC: 2.69 MIL/uL — ABNORMAL LOW (ref 4.22–5.81)
RDW: 17.8 % — ABNORMAL HIGH (ref 11.5–15.5)
WBC: 8 10*3/uL (ref 4.0–10.5)
nRBC: 0 % (ref 0.0–0.2)
nRBC: 0 /100 WBC

## 2018-11-06 LAB — COMPREHENSIVE METABOLIC PANEL
ALT: 86 U/L — ABNORMAL HIGH (ref 0–44)
AST: 63 U/L — ABNORMAL HIGH (ref 15–41)
Albumin: 1.5 g/dL — ABNORMAL LOW (ref 3.5–5.0)
Alkaline Phosphatase: 139 U/L — ABNORMAL HIGH (ref 38–126)
Anion gap: 6 (ref 5–15)
BUN: 19 mg/dL (ref 6–20)
CO2: 31 mmol/L (ref 22–32)
Calcium: 8 mg/dL — ABNORMAL LOW (ref 8.9–10.3)
Chloride: 100 mmol/L (ref 98–111)
Creatinine, Ser: 0.92 mg/dL (ref 0.61–1.24)
GFR calc Af Amer: >60 mL/min (ref >60)
GFR calc non Af Amer: >60 mL/min (ref >60)
Glucose, Bld: 109 mg/dL — ABNORMAL HIGH (ref 70–99)
Potassium: 3.7 mmol/L (ref 3.5–5.1)
Sodium: 137 mmol/L (ref 135–145)
Total Bilirubin: 0.9 mg/dL (ref 0.3–1.2)
Total Protein: 5.6 g/dL — ABNORMAL LOW (ref 6.5–8.1)

## 2018-11-06 MED ORDER — SODIUM CHLORIDE 0.9% FLUSH
10.0000 mL | INTRAVENOUS | Status: DC | PRN
  Administered 2018-11-06: 20 mL
  Administered 2018-11-11 – 2018-11-12 (×2): 10 mL
  Filled 2018-11-06 (×3): qty 40

## 2018-11-06 NOTE — Progress Notes (Signed)
Orthopedic Tech Progress Note Patient Details:  Aaron [name] 1965-10-09 [id number]  [name], male   DOB: 28-Nov-1965, 53 y.o.   MRN: [id number] Applied cpm to lle -10-60  Trinna Post 11/06/2018, 5:21 PM

## 2018-11-06 NOTE — Progress Notes (Addendum)
Social Work  Social Work Assessment and Plan  Patient Details  Name: Aaron [name] MRN: [id number] Date of Birth: 1965-11-21  Today's Date: 11/06/2018  Problem List:  Patient Active Problem List   Diagnosis Date Noted  . Debility 11/05/2018  . Pressure injury of skin 11/04/2018  . Iron deficiency anemia   . Orthostatic hypotension   . Hematoma of right thigh 10/30/2018  . Acute blood loss anemia 10/30/2018  . Tobacco abuse 10/30/2018  . Gastritis with bleeding 10/30/2018  . Esophagitis, [place] grade C 10/30/2018  . Protein-calorie malnutrition, severe 10/26/2018  . Hypokalemia   . Atrial fibrillation with rapid ventricular response (HCC) 10/12/2018  . Morbid obesity (HCC) 10/09/2018  . Acute systolic CHF (congestive heart failure) (HCC) 10/02/2018  . Acute hypoxemic respiratory failure (HCC) 10/02/2018  . Anasarca 10/01/2018  . AKI (acute kidney injury) (HCC) 10/01/2018   Past Medical History:  Past Medical History:  Diagnosis Date  . Morbid obesity (HCC)    Past Surgical History:  Past Surgical History:  Procedure Laterality Date  . ESOPHAGOGASTRODUODENOSCOPY (EGD) WITH PROPOFOL N/A 10/18/2018   Procedure: ESOPHAGOGASTRODUODENOSCOPY (EGD) WITH PROPOFOL;  Surgeon: Kerin Salen, MD;  Location: [facility] END[facility]COPY;  Service: Gastroenterology;  Laterality: N/A;  . RIGHT/LEFT HEART CATH AND CORONARY ANGIOGRAPHY N/A 10/09/2018   Procedure: RIGHT/LEFT HEART CATH AND CORONARY ANGIOGRAPHY;  Surgeon: Iran Ouch, MD;  Location: [facility] INVASIVE CV LAB;  Service: Cardiovascular;  Laterality: N/A;   Social History:  reports that he has never smoked. He has quit using smokeless tobacco.  His smokeless tobacco use included snuff. He reports that he does not drink alcohol or use drugs.  Family / Support Systems Marital Status: Married Patient Roles: Spouse, Parent Spouse/Significant Other: Rinaldo Cloud [phone] Children: Scott-son [phone] Other Supports: Extended family and  friends Anticipated Caregiver: [name] and adult son's Ability/Limitations of Caregiver: Elita Quick is a paramedic who works nights but will have son's and other family members assist Caregiver Availability: 24/7 Family Dynamics: Close knit family member who will do for one another, they have always done this and will provide the care pt needs at home. Pt feels he is in good hands at home.  Social History Preferred language: English Religion: Lutheran Cultural Background: No issues Education: Paramedic school Read: Yes Write: Yes Employment Status: (applying for SSD-sent in) Marine scientist Issues: No issues Guardian/Conservator: None-according to MD pt is capable of making his own decisions while here, he does want his wife included in any decision while here   Abuse/Neglect Abuse/Neglect Assessment Can Be Completed: Yes Physical Abuse: Denies Verbal Abuse: Denies Sexual Abuse: Denies Exploitation of patient/patient's resources: Denies Self-Neglect: Denies  Emotional Status Pt's affect, behavior and adjustment status: Pt is motivated to do well and recover from his health issues-he is very deconditioned and needs rest breaks throughout the therapy day. Wife voiced he has always been independent and will do what ever is needed to regain this.  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 They do talk with the MD daily and feel their  questions are answered and addressed. Premorbid  pt/family roles/activities: Husband, father, friend, church member, etc Anticipated changes in roles/activities/participation: resume Pt/family expectations/goals: Pt states: " I want to do for myself if I can before I leave here."  Wife states: " Our fmaily will do whatever he needs, but know he wants to do for himself."  [employer]: None Premorbid Home Care/DME Agencies: Other (Comment)(rw and bsc) Transportation available at discharge: Family  Resource referrals recommended: Neuropsychology, Support group (specify)  Discharge Planning Living Arrangements: Spouse/significant other Support Systems: Spouse/significant other, Children, Friends/neighbors, Church/faith community Type of Residence: Private residence Insurance Resources: Self-pay(Pending Medicaid) Financial Resources: Family Support Financial Screen Referred: Yes Living Expenses: Lives with family Money Management: Patient, Spouse Does the patient have any problems obtaining your medications?: No Home Management: Pt was managing the home prior to admission, now wife and son's will do this Patient/Family Preliminary Plans: Return home with wife and son's assisting if needed. Wife works at night so one of their son's can be there then and while she sleeps. Will await therpay team evaluations and work on discharge needs. Social Work Anticipated Follow Up Needs: HH/OP, Support Group  Clinical Impression Pleasant severely deconditioned gentleman who will need rest breaks in between his therapies. Wife has applied for SSD and Medicaid for him. Family is very involved and will provide 24 hr care if needed at discharge. Do feel he would benefit from seeing neuro-psych while here. Will make referral to be seen next week.   Lucy Chris 11/06/2018, 12:35 PM

## 2018-11-06 NOTE — Care Management Note (Addendum)
Inpatient Rehabilitation Center Individual Statement of Services  Patient Name:  Aaron [name]  Date:  11/06/2018  Welcome to the Inpatient Rehabilitation Center.  Our goal is to provide you with an individualized program based on your diagnosis and situation, designed to meet your specific needs.  With this comprehensive rehabilitation program, you will be expected to participate in at least 3 hours of rehabilitation therapies Monday-Friday, with modified therapy programming on the weekends.  Your rehabilitation program will include the following services:  Physical Therapy (PT), Occupational Therapy (OT), 24 hour per day rehabilitation nursing, Neuropsychology, Case Management (Social Worker), Rehabilitation Medicine, Nutrition Services and Pharmacy Services  Weekly team conferences will be held on Wednesday to discuss your progress.  Your Social Worker will talk with you frequently to get your input and to update you on team discussions.  Team conferences with you and your family in attendance may also be held.  Expected length of stay: 24-28 days  Overall anticipated outcome: min assist level  Depending on your progress and recovery, your program may change. Your Social Worker will coordinate services and will keep you informed of any changes. Your Social Worker's name and contact numbers are listed  below.  The following services may also be recommended but are not provided by the Inpatient Rehabilitation Center:   Driving Evaluations  Home Health Rehabiltiation Services  Outpatient Rehabilitation Services    Arrangements will be made to provide these services after discharge if needed.  Arrangements include referral to agencies that provide these services.  Your insurance has been verified to be:  Pending Medicaid-Wife is working on Scientist, research (physical sciences) primary doctor is:  Heritage manager Family -[employer]  Pertinent information will be shared with your doctor and your insurance  company.  Social Worker:  Dossie Der, SW [phone] or ([phone]  Information discussed with and copy given to patient by: Lucy Chris, 11/06/2018, 12:37 PM

## 2018-11-06 NOTE — Progress Notes (Signed)
[facility] PHYSICAL MEDICINE & REHABILITATION PROGRESS NOTE   Subjective/Complaints:  Had good result with her supp this am  ROS- neg CP, SOB, N/V/D   Objective:   No results found. Recent Labs    11/04/18 1240 11/06/18 0511  WBC 7.4 8.0  HGB 7.7* 7.4*  HCT 25.4* 24.5*  PLT 329 304   Recent Labs    11/06/18 0511  NA 137  K 3.7  CL 100  CO2 31  GLUCOSE 109*  BUN 19  CREATININE 0.92  CALCIUM 8.0*    Intake/Output Summary (Last 24 hours) at 11/06/2018 0725 Last data filed at 11/06/2018 0500 Gross per 24 hour  Intake 360 ml  Output 600 ml  Net -240 ml     Physical Exam: Vital Signs Blood pressure 101/68, pulse 97, temperature 98.8 F (37.1 C), temperature source Oral, resp. rate 16, weight 133.4 kg, SpO2 95 %.     Assessment/Plan: 1. Functional deficits secondary to severe debility CHF which require 3+ hours per day of interdisciplinary therapy in a comprehensive inpatient rehab setting.  Physiatrist is providing close team supervision and 24 hour management of active medical problems listed below.  Physiatrist and rehab team continue to assess barriers to discharge/monitor patient progress toward functional and medical goals  Care Tool:  Bathing              Bathing assist       Upper Body Dressing/Undressing Upper body dressing        Upper body assist      Lower Body Dressing/Undressing Lower body dressing            Lower body assist       Toileting Toileting    Toileting assist       Transfers Chair/bed transfer  Transfers assist           Locomotion Ambulation   Ambulation assist              Walk 10 feet activity   Assist           Walk 50 feet activity   Assist           Walk 150 feet activity   Assist           Walk 10 feet on uneven surface  activity   Assist           Wheelchair     Assist               Wheelchair 50 feet with 2 turns  activity    Assist            Wheelchair 150 feet activity     Assist          Medical Problem List and Plan: 1.Deconditioningsecondary to congestive heart failure, ischemic cardiomyopathy CIR evals today Have written cardiac precaution based on EF 15% 2. Antithrombotics: -DVT/anticoagulation:Pharmaceutical:Other (comment)--Eliquis -antiplatelet therapy: Low-dose ASA. 3. Pain Management:  4. Mood:We will monitor and involve neuropsychology as needed -antipsychotic agents:None 5. Neuropsych: This patientiscapable of making decisions on hisown behalf. 6. Skin/Wound Care:routine pressure relief measures. 7. Fluids/Electrolytes/Nutrition:Monitor I/O. Check lytes in am.  8.Systolic CHF/AKI: Heart healthy diet monitor weights daily. Managed with losartan, Lasix,digoxin,Coreg and Lipitor.Monitor renal status with serial checks.  9.Severe CAD: Aggressive medical therapy recommended at this time. On Coreg, losartan, ASA and statin. Off Plavix due to needingeliquis for Afib 10.A. fib with RVR: Monitor heart rate twice daily. Continue digoxin,Coreg and amiodarone.On Eliquis Vitals:  11/06/18 0359 11/06/18 0401  BP: 101/68   Pulse: (!) 101 97  Resp: 16   Temp: 98.8 F (37.1 C)   SpO2: 95%   rate mildly elevated as expected due to low CO 11.Right thigh hematoma with femoral plexopathy: Bledsoe brace for support. Monitor with for recovery 12.Upper GI bleed: Monitor H&H with serial checks. Continue Protonix twice daily 13.ABLA/iron deficiency anemia/folic acid deficiency:Low iron with elevated ferritin-- Add folic acid. Question IV iron for supplementation.  Hgb stable 4/10 14. Hyponatremia: Recheck lytes in am.    LOS: 1 days A FACE TO FACE EVALUATION WAS PERFORMED  Erick Colace 11/06/2018, 7:25 AM

## 2018-11-06 NOTE — Evaluation (Signed)
Physical Therapy Assessment and Plan  Patient Details  Name: Aaron [name] MRN: [id number] Date of Birth: 1966-01-08  PT Diagnosis: Difficulty walking, Dizziness and giddiness, Impaired sensation and Muscle weakness Rehab Potential: Fair ELOS: 24-28 days    Today's Date: 11/06/2018 PT Individual Time: 1330-1445 AND 805-900 PT Individual Time Calculation (min): 75 min  And 55 min   Problem List:  Patient Active Problem List   Diagnosis Date Noted  . Debility 11/05/2018  . Pressure injury of skin 11/04/2018  . Iron deficiency anemia   . Orthostatic hypotension   . Hematoma of right thigh 10/30/2018  . Acute blood loss anemia 10/30/2018  . Tobacco abuse 10/30/2018  . Gastritis with bleeding 10/30/2018  . Esophagitis, [place] grade C 10/30/2018  . Protein-calorie malnutrition, severe 10/26/2018  . Hypokalemia   . Atrial fibrillation with rapid ventricular response ([place]) 10/12/2018  . Morbid obesity ([place]) 10/09/2018  . Acute systolic CHF (congestive heart failure) ([place]) 10/02/2018  . Acute hypoxemic respiratory failure ([place]) 10/02/2018  . Anasarca 10/01/2018  . AKI (acute kidney injury) ([place]) 10/01/2018    Past Medical History:  Past Medical History:  Diagnosis Date  . Morbid obesity ([place])    Past Surgical History:  Past Surgical History:  Procedure Laterality Date  . ESOPHAGOGASTRODUODENOSCOPY (EGD) WITH PROPOFOL N/A 10/18/2018   Procedure: ESOPHAGOGASTRODUODENOSCOPY (EGD) WITH PROPOFOL;  Surgeon: Ronnette Juniper, MD;  Location: [place];  Service: Gastroenterology;  Laterality: N/A;  . RIGHT/LEFT HEART CATH AND CORONARY ANGIOGRAPHY N/A 10/09/2018   Procedure: RIGHT/LEFT HEART CATH AND CORONARY ANGIOGRAPHY;  Surgeon: Wellington Hampshire, MD;  Location: [facility] CV LAB;  Service: Cardiovascular;  Laterality: N/A;    Assessment & Plan Clinical Impression: Patient is a 53 year old male admitted on 10/01/18 withabdominal pain peripheral edema and shortness of breath. He  was noted to have anasarca with abnormal LFTs, AKI and RLL effusion with concerns of pneumonia due to leukocytosis. Abdominal ultrasound done revealing cholelithiasis with fatty liver and liver Doppler was negative for thrombosis. 2D echo revealed diffuse hypokinesis with ejection fraction of 15%. Dr. Tawni Levy consulted for input and felt that AKI/elevated LFTs likely due to congestion. He underwent cardiac cath by Dr. Cindee Lame revealing severe three-vessel CAD with global hypokinesis with LVEF 15% and moderate pulmonary hypertension.Dr. Roxan Hockey did not feel patient to be a CABG candidate and medical treatment recommended. He did develop A. fib with RVR requiring IV amiodarone for rate control and was started on heparin. He did develop right thigh pain with quad weakness felt to be due to femoral nerve compression and Dr. Marlou Sa recommended Bledsoe brace for support when up and that hematoma would resolve with time.[name]  He also developed nausea vomiting due to adynamic ileus that improved with placement of NG tube. He was noted to have coffee-ground fluid from NG tube and Dr.Karki/GI recommended n.p.o. with IV Protonixwith monitoring H/H for further drop. He underwent EGD on 3/22revealing esophagitis with nonbleeding esophageal ulcers and erosive gastropathy. Clear liquids as well as supportive care recommended. He continues to have issues with N/V as well as abdominal distention with concerns of early SBO. GI symptoms slowly improved with tolerance of NG tube clamping and eventually removed by 4/3. He was cleared to start anticoagulation and diet slowly advanced to regular.Acute systolic CHF compensated--aldactone changed to lasix on 4/8 due to upward trend in weights. H&H stable and he continues to have intermittent issues with anxiety as well as dizziness.Patient noted to be severely deconditioned and therapy working on pre-gait  measures as well as sitting at the edge of the bed.    Patient transferred to CIR on 11/05/2018 .   Patient currently requires total with mobility secondary to muscle weakness and muscle paralysis, decreased cardiorespiratoy endurance and decreased oxygen support and decreased sitting balance, decreased standing balance, decreased balance strategies and difficulty maintaining precautions.  Prior to hospitalization, patient was independent  with mobility and lived with Spouse, Son in a House home.  Home access is (pt plans to have a ramp installed before d/c)Stairs to enter.  Patient will benefit from skilled PT intervention to maximize safe functional mobility, minimize fall risk and decrease caregiver burden for planned discharge home with 24 hour assist.  Anticipate patient will benefit from follow up [facility] at discharge.  PT - End of Session Activity Tolerance: Tolerates < 10 min activity, no significant change in vital signs PT Assessment Rehab Potential (ACUTE/IP ONLY): Fair PT Barriers to Discharge: [place] home environment;Decreased caregiver support;Home environment access/layout;Insurance for SNF coverage;Weight bearing restrictions PT Patient demonstrates impairments in the following area(s): Balance;Edema;Endurance;Motor;Perception;Safety;Sensory;Skin Integrity PT Transfers Functional Problem(s): Bed Mobility;Bed to Chair;Car;Furniture PT Locomotion Functional Problem(s): Ambulation;Wheelchair Mobility;Stairs PT Plan PT Intensity: Minimum of 1-2 x/day ,45 to 90 minutes PT Frequency: 5 out of 7 days PT Duration Estimated Length of Stay: 24-28 days  PT Treatment/Interventions: Ambulation/gait training;Balance/vestibular training;Discharge planning;Community reintegration;Disease management/prevention;DME/adaptive equipment instruction;Functional electrical stimulation;Functional mobility training;Patient/family education;Pain management;Neuromuscular re-education;Psychosocial support;Skin care/wound management;Splinting/orthotics;Therapeutic  Exercise;Therapeutic Activities;Stair training;UE/LE Coordination activities;UE/LE Strength taining/ROM;Visual/perceptual remediation/compensation;Wheelchair propulsion/positioning PT Transfers Anticipated Outcome(s): Min assist with LRAD  PT Locomotion Anticipated Outcome(s): Supervision assist WC level. min-mod assist ambulatory  PT Recommendation Recommendations for Other Services: Neuropsych consult Follow Up Recommendations: Home health PT Patient destination: Home Equipment Recommended: Wheelchair (measurements);Wheelchair cushion (measurements);Rolling walker with 5" wheels;To be determined  Skilled Therapeutic Intervention Session 1.  Pt received supine in bed and agreeable to PT. Supine>sit transfer with max assist and max cues for safety. PT instructed patient in PT Evaluation and initiated treatment intervention; see below for results. PT educated patient in [place], rehab potential, rehab goals, and discharge recommendations. Sitting balance EOB x 73mntues with min progressing to supervision assist from PT with 1-0 UE support. Orthostatic s/s thant took 5 minutes to dissipate.  Sit>supine completed with max assist. Rolling R and L as listed below. Pt  left supine in bed with call bell in reach and all needs met. PT [facility]     Session 2.  Pt received supine in bed and agreeable to PT. PT able to locate maximove. Rolling R and L with mon assist for LE positioning to place maximove sling. Maximove transfer to [place] WC mobility x 79fwith min assist to prevent hitting obstacles on the R. Pt returned to room and performed maximove transfer to bed . Pt reports need for BM. Bed pan placement with maxi move. PT performed pericare. Pt left supine in bed with call bell in reach and all needs met.     PT Evaluation Precautions/Restrictions   General   Vital SignsTherapy Vitals Temp: 97.9 F (36.6 C) Temp Source: Oral Pulse Rate: 97 Resp: 18 BP:  101/69 Patient Position (if appropriate): Lying Oxygen Therapy SpO2: 90 % O2 Device: Room Air Pain   Home Living/Prior Functioning Home Living Available Help at Discharge: Family;Available 24 hours/day Type of Home: House Entrance Stairs-Number of Steps: (pt plans to have a ramp installed before d/c) Home Layout: Multi-level;Able to live on main level with bedroom/bathroom Bathroom Shower/Tub: Tub/shower  unit Bathroom Toilet: Handicapped height Bathroom Accessibility: Yes  Lives With: Spouse;Son Prior Function Level of Independence: Independent with basic ADLs;Independent with homemaking with ambulation;Independent with gait Driving: Yes Comments: Prior to recent weakness, 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 within last 3-4 weeks everything became a lot harder, SOB, difficulty moving and this last week could not make it up and down his own steps in his house Vision/Perception  Perception Perception: Within Functional Limits Praxis Praxis: Intact  Cognition Overall Cognitive Status: Within Functional Limits for tasks assessed Arousal/Alertness: Awake/alert Attention: Sustained Sustained Attention: Appears intact Memory: Appears intact Awareness: Appears intact Problem Solving: Appears intact Safety/Judgment: Appears intact Sensation Sensation Light Touch: Impaired Detail Peripheral sensation comments: decreased appreciation to light touch due to nerve damage from hematoma .  Light Touch Impaired Details: Impaired RLE Proprioception: Appears Intact Coordination Gross Motor Movements are Fluid and Coordinated: No Fine Motor Movements are Fluid and Coordinated: Yes Coordination and Movement Description: poor coordination in the RLE from weakness  Heel Shin Test: unable to perform on the R Motor  Motor Motor: Other (comment) Motor - Skilled Clinical Observations: gernalized weakness and R quad Paresis    Mobility Bed Mobility Bed Mobility: Rolling  Right;Rolling Left;Sitting - Scoot to Edge of Bed;Supine to Sit Rolling Right: Maximal Assistance - Patient 25-49% Rolling Left: Maximal Assistance - Patient 25-49% Supine to Sit: Maximal Assistance - Patient - Patient 25-49% Sitting - Scoot to Edge of Bed: Maximal Assistance - Patient 25-49% Transfers Transfers: Transfer via Lift Equipment(unable to perform) Transfer via Lift Equipment: Occupational psychologist Ambulation: No Gait Gait: No Stairs / Additional Locomotion Stairs: No Architect: Yes Wheelchair Assistance: Minimal assistance - Patient >75% Wheelchair Propulsion: Both upper extremities Wheelchair Parts Management: Needs assistance  Trunk/Postural Assessment  Cervical Assessment Cervical Assessment: Exceptions to WFL(forward head) Thoracic Assessment Thoracic Assessment: Exceptions to WFL(rounded shoulders) Lumbar Assessment Lumbar Assessment: Exceptions to WFL(posterior pelvic tilt) Postural Control Postural Control: Within Functional Limits  Balance Balance Balance Assessed: Yes Dynamic Sitting Balance Dynamic Sitting - Level of Assistance: 5: Stand by assistance Sitting balance - Comments: sitting EOB.  Static Standing Balance Static Standing - Level of Assistance: Not tested (comment) Static Standing - Comment/# of Minutes: unable to perform standing due to orthostatic s/s  Extremity Assessment      RLE Assessment RLE Assessment: Exceptions to [facility] General Strength Comments: 0/5 knee extension. grossly 2+/5 all other movements.  LLE Assessment LLE Assessment: Exceptions to [facility] General Strength Comments: grossly 3-/5 proximal to distal     Refer to Care Plan for Long Term Goals  Recommendations for other services: Neuropsych  Discharge Criteria: Patient will be discharged from PT if patient refuses treatment 3 consecutive times without medical reason, if treatment goals not met, if there is a change in medical status,  if patient makes no progress towards goals or if patient is discharged from hospital.  The above assessment, treatment plan, treatment alternatives and goals were discussed and mutually agreed upon: by patient  Lorie Phenix 11/06/2018, 4:22 PM

## 2018-11-06 NOTE — Evaluation (Signed)
Occupational Therapy Assessment and Plan  Patient Details  Name: Aaron [name] MRN: [id number] Date of Birth: [birth year]  OT Diagnosis: abnormal posture, acute pain, muscle weakness (generalized) and swelling of limb Rehab Potential:  Good ELOS:   3-4 weeks   Today's Date: 11/06/2018 OT Individual Time: 0920-1030 OT Individual Time Calculation (min): 70 min    Problem List:  Patient Active Problem List   Diagnosis Date Noted  . Debility 11/05/2018  . Pressure injury of skin 11/04/2018  . Iron deficiency anemia   . Orthostatic hypotension   . Hematoma of right thigh 10/30/2018  . Acute blood loss anemia 10/30/2018  . Tobacco abuse 10/30/2018  . Gastritis with bleeding 10/30/2018  . Esophagitis, [place] grade C 10/30/2018  . Protein-calorie malnutrition, severe 10/26/2018  . Hypokalemia   . Atrial fibrillation with rapid ventricular response ([place]) 10/12/2018  . Morbid obesity ([place]) 10/09/2018  . Acute systolic CHF (congestive heart failure) ([place]) 10/02/2018  . Acute hypoxemic respiratory failure ([place]) 10/02/2018  . Anasarca 10/01/2018  . AKI (acute kidney injury) ([place]) 10/01/2018    Past Medical History:  Past Medical History:  Diagnosis Date  . Morbid obesity ([place])    Past Surgical History:  Past Surgical History:  Procedure Laterality Date  . ESOPHAGOGASTRODUODENOSCOPY (EGD) WITH PROPOFOL N/A 10/18/2018   Procedure: ESOPHAGOGASTRODUODENOSCOPY (EGD) WITH PROPOFOL;  Surgeon: Ronnette Juniper, MD;  Location: [facility];  Service: Gastroenterology;  Laterality: N/A;  . RIGHT/LEFT HEART CATH AND CORONARY ANGIOGRAPHY N/A 10/09/2018   Procedure: RIGHT/LEFT HEART CATH AND CORONARY ANGIOGRAPHY;  Surgeon: Wellington Hampshire, MD;  Location: [facility];  Service: Cardiovascular;  Laterality: N/A;    Assessment & Plan Clinical Impression: Melville Engen is a 53 year old male admitted on 10/01/18 withabdominal pain peripheral edema and shortness of breath. He was noted to have  anasarca with abnormal LFTs, AKI and RLL effusion with concerns of pneumonia due to leukocytosis. Abdominal ultrasound done revealing cholelithiasis with fatty liver and liver Doppler was negative for thrombosis. 2D echo revealed diffuse hypokinesis with ejection fraction of 15%. Dr. Tawni Levy consulted for input and felt that AKI/elevated LFTs likely due to congestion. He underwent cardiac cath by Dr. Cindee Lame revealing severe three-vessel CAD with global hypokinesis with LVEF 15% and moderate pulmonary hypertension.Dr. Roxan Hockey did not feel patient to be a CABG candidate and medical treatment recommended. He did develop A. fib with RVR requiring IV amiodarone for rate control and was started on heparin. He did develop right thigh pain with quad weakness felt to be due to femoral nerve compression and Dr. Marlou Sa recommended Bledsoe brace for support when up and that hematoma would resolve with time.[name]  He also developed nausea vomiting due to adynamic ileus that improved with placement of NG tube. He was noted to have coffee-ground fluid from NG tube and Dr.Karki/GI recommended n.p.o. with IV Protonixwith monitoring H/H for further drop. He underwent EGD on 3/22revealing esophagitis with nonbleeding esophageal ulcers and erosive gastropathy. Clear liquids as well as supportive care recommended. He continues to have issues with N/V as well as abdominal distention with concerns of early SBO. GI symptoms slowly improved with tolerance of NG tube clamping and eventually removed by 4/3. He was cleared to start anticoagulation and diet slowly advanced to regular.Acute systolic CHF compensated--aldactone changed to lasix on 4/8 due to upward trend in weights. H&H stable and he continues to have intermittent issues with anxiety as well as dizziness.Patient noted to be severely deconditioned and therapy working on pre-gait measures  as well as sitting at the edge of the bed. CIR recommended due to  deconditioning   Patient currently requires total with basic self-care skills secondary to muscle weakness, decreased cardiorespiratoy endurance and decreased sitting balance and decreased standing balance.  Prior to hospitalization, patient could complete BADLs with independent .  Patient will benefit from skilled intervention to increase independence with basic self-care skills prior to discharge home with care partner.  Anticipate patient will require 24 hour supervision and follow up home health.    Skilled Therapeutic Intervention Skilled OT session completed with focus on initial evaluation, education on OT role/POC, and establishment of patient-centered goals. Mod-Max A for supine<sit from bed with heavy bedrail use. Pt able to maintain static sitting balance with supervision, after Max A provided for scooting Rt hip forward. Pt completed bathing and dressing EOB and bedlevel. 02 sats on 1L at start of session 95-100%. EOB, he removed 02 and was able to maintain 91-100% sats. No c/o dizziness or SOB, just fatigue. However due to neck pain (4/10) and fatigue, he did not feel strong enough to attempt a stand. "We can try tomorrow." 2 assist for perihygiene completion bedlevel with pt rolling Rt>Lt with Max A for R LE mgt. Per RN, brief to be kept off in bed, but ok to wear once sitting up in w/c. Pt was boosted up in bed with 2 assist, though able to help out by pulling up on headboard. Pt was repositioned for comfort and left with all needs within reach. Once back in bed, 02 sats decreased to 89%, and therefore [place] was placed on him again, set at 1L.   OT Evaluation Precautions/Restrictions  R LE Bledsoe brace locked into extension, ok to unlock when EOB Vital Signs Therapy Vitals Temp: 97.9 F (36.6 C) Temp Source: Oral Pulse Rate: 97 Resp: 18 BP: 101/69 Patient Position (if appropriate): Lying Oxygen Therapy SpO2: 90 % O2 Device: Room Air Pain: 4/10 in neck, subsided once back in  bed. RN made aware. Per pt "I don't take pain medicine."     Home Living/Prior Functioning Home Living Family/patient expects to be discharged to:: Private residence Living Arrangements: Spouse/significant other Available Help at Discharge: Family, Available 24 hours/day Type of Home: House Home Access: Stairs to enter [employer] of Steps: (pt plans to have a ramp installed before d/c) Entrance Stairs-Rails: Right Home Layout: Multi-level, Able to live on main level with bedroom/bathroom Alternate Level Stairs-Number of Steps: 5 Alternate Level Stairs-Rails: Right Bathroom Shower/Tub: Chiropodist: Handicapped height Bathroom Accessibility: Yes  Lives With: Spouse, Son IADL History Homemaking Responsibilities: Yes Meal Prep Responsibility: Primary Laundry Responsibility: Primary Cleaning Responsibility: Primary Bill Paying/Finance Responsibility: Primary Shopping Responsibility: Primary Homemaking Comments: Per pt, he "ran the household" while spouse worked as a Audiological scientist. 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 within last 3-4 weeks everything became a lot harder, SOB, difficulty moving and this last week could not make it up and down his own steps in his house ADL ADL Grooming: Setup Where Assessed-Grooming: Edge of bed Upper Body Bathing: Supervision/safety Where Assessed-Upper Body Bathing: Edge of bed Lower Body Bathing: Other (comment)(2 assist, bedlevel) Where Assessed-Lower Body Bathing:  Bed level Upper Body Dressing: Minimal assistance(hospital gown) Where  Assessed-Upper Body Dressing: Edge of bed Lower Body Dressing: Maximal assistance Where Assessed-Lower Body Dressing: Edge of bed Toileting: Not assessed Toilet Transfer: Not assessed Social research officer, government: Not assessed ADL Comments: Standing balance not assessed due to pain, fatigue, and pt refusal to attempt  Vision  No apparent visual deficits  Perception   Perception: Intact  Praxis Praxis: Intact Cognition Overall Cognitive Status: Within Functional Limits for tasks assessed Arousal/Alertness: Awake/alert Orientation Level: Person;Place;Situation Person: Oriented Place: Oriented Situation: Oriented Year: 2020 Month: April Day of Week: Correct Memory: Appears intact Immediate Memory Recall: Sock;Blue;Bed Memory Recall: Sock;Blue;Bed Memory Recall Sock: Without Cue Memory Recall Blue: Without Cue Memory Recall Bed: Without Cue Attention: Sustained Sustained Attention: Appears intact Awareness: Appears intact Problem Solving: Appears intact Safety/Judgment: Appears intact Sensation  Impaired R LE  Motor   Global weakness  Mobility    Max A for bed mobility, +2 for perihygiene bedlevel  Trunk/Postural Assessment    Cervical assessment: forward head Thoracic assessment: rounded shoulders Lumbar assessment: posterior pelvic tilt Balance  Supervision dynamic sitting balance when washing LEs EOB Standing balance not assessed due to pt pain, fatigue, and refusal to attempt  Extremity/Trunk Assessment  R + L UE 4-/5 grossly, AROM WNL    Refer to Care Plan for Long Term Goals  Recommendations for other services: None    Discharge Criteria: Patient will be discharged from OT if patient refuses treatment 3 consecutive times without medical reason, if treatment goals not met, if there is a change in medical status, if patient makes no progress towards goals or if patient is discharged from hospital.  The above assessment, treatment plan, treatment alternatives and  goals were discussed and mutually agreed upon: by patient  Skeet Simmer 11/06/2018, 4:26 PM

## 2018-11-06 NOTE — Discharge Instructions (Signed)

## 2018-11-06 NOTE — Progress Notes (Signed)
Pt states his stomach feels full and has been passing gas. Pt requests suppository for bowel movement. Pt states he wants rectal suppository despite having a bowel movement yesterday. Pt states" I need the suppository I feel full I need to have a bowel movement". Rectal suppository given.

## 2018-11-06 NOTE — Progress Notes (Signed)
Inpatient Rehabilitation  Patient information reviewed and entered into eRehab system by Maysin Carstens M. Ishmael Berkovich, M.A., CCC/SLP, PPS Coordinator.  Information including medical coding, functional ability and quality indicators will be reviewed and updated through discharge.    

## 2018-11-07 ENCOUNTER — Inpatient Hospital Stay (HOSPITAL_COMMUNITY): Payer: Self-pay | Admitting: Physical Therapy

## 2018-11-07 ENCOUNTER — Inpatient Hospital Stay (HOSPITAL_COMMUNITY): Payer: Self-pay | Admitting: Occupational Therapy

## 2018-11-07 DIAGNOSIS — R5381 Other malaise: Principal | ICD-10-CM

## 2018-11-07 MED ORDER — SODIUM CHLORIDE 0.9 % IV SOLN
INTRAVENOUS | Status: DC | PRN
  Administered 2018-11-07: 250 mL via INTRAVENOUS

## 2018-11-07 MED ORDER — FUROSEMIDE 10 MG/ML IJ SOLN
40.0000 mg | Freq: Once | INTRAMUSCULAR | Status: AC
  Administered 2018-11-07: 40 mg via INTRAVENOUS
  Filled 2018-11-07: qty 4

## 2018-11-07 NOTE — Progress Notes (Signed)
Physical Therapy Session Note  Patient Details  Name: Aaron [name] MRN: [id number] Date of Birth: Jul 21, 1966  Today's Date: 11/07/2018 PT Individual Time: 7998-7215 PT Individual Time Calculation (min): 45 min   Short Term Goals: Week 1:  PT Short Term Goal 1 (Week 1): Pt will perform bed mobliity with mod assist  PT Short Term Goal 2 (Week 1): Pt will propell WC with min assist x 170f.  PT Short Term Goal 3 (Week 1): Pt will perform bed<>WC transfer with max assist +2  PT Short Term Goal 4 (Week 1): Pt will ambulate 180fwith mod assist   Skilled Therapeutic Interventions/Progress Updates:   RN reports increased pulmonary edema this AM into PM and strongly recommends bed level therapy at this time to prevent additional cardiac demand ischemia. RN present at start of session and reports improved lung sounds with ascultation. Pt received supine in bed and agreeable to PT. Supine therex SAQ with AAROM on the R 2 x 8 . Flexion/ extension in knee/2hip x 8 BLE. Hip adduction/adduction with knee flexed 2x 10 BLE. Ankle DF/PF 2 x 15. Shoulder flexion to raise ball over head. Chest press with ball, lateral rotation within available ROM x 8 Bil. Pt noted to have increased cough throughout treatment and reports feeling like he has additional fluid in lungs. Pt left supine in bed with all needs met. .       Therapy Documentation Precautions:  Precautions Precautions: Fall Precaution Comments: Watch BP; VitalGo tilt bed Required Braces or Orthoses: Other Brace Other Brace: RLE Bledsoe brace locked in extension when up- okay to unlock at EOB  Restrictions Weight Bearing Restrictions: No RLE Weight Bearing: Weight bearing as tolerated Vital Signs: Therapy Vitals Temp: 98 F (36.7 C) Temp Source: Oral Pulse Rate: 90 Resp: 18 BP: (!) 97/56 Patient Position (if appropriate): Lying Oxygen Therapy SpO2: 98 % O2 Device: Room Air Pain:   denies   Therapy/Group: Individual Therapy  AuLorie Phenix/05/2019, 4:30 PM

## 2018-11-07 NOTE — Progress Notes (Signed)
Patient complained of new onset wet cough and mild difficulty breathing after therapy session in am. MD notified and assessed patient. Orders given for IV lasix. Patient's cough and lung sounds improved following lasix, no further discomforts this shift.

## 2018-11-07 NOTE — Progress Notes (Signed)
Aaron [name] is a 53 y.o. male admitted for CIR with general debility secondary to ischemic cardiomyopathy and congestive heart failure  Past Medical History:  Diagnosis Date  . Morbid obesity ([facility])      Subjective: No new complaints. No new problems. Slept well.   Objective: Vital signs in last 24 hours: Temp:  [97.9 F (36.6 C)-99.3 F (37.4 C)] 99.3 F (37.4 C) (04/11 0424) Pulse Rate:  [84-97] 87 (04/11 0848) Resp:  [18-20] 20 (04/11 0424) BP: (100-116)/(60-69) 100/60 (04/11 0848) SpO2:  [90 %-96 %] 95 % (04/11 0424) Weight:  [131.5 kg] 131.5 kg (04/11 0424) Weight change: -1.814 kg Last BM Date: 11/06/18  Intake/Output from previous day: 04/10 0701 - 04/11 0700 In: 260 [P.O.:240; I.V.:20] Out: 700 [Urine:700]  Patient Vitals for the past 24 hrs:  BP Temp Temp src Pulse Resp SpO2 Weight  11/07/18 0848 100/60 - - 87 - - -  11/07/18 0424 116/65 99.3 F (37.4 C) Oral 97 20 95 % 131.5 kg  11/06/18 1929 102/67 99.3 F (37.4 C) Oral 84 18 96 % -  11/06/18 1518 101/69 97.9 F (36.6 C) Oral 97 18 90 % -     Physical Exam General: No apparent distress   morbidly obese.  Nasal cannula O2 in place HEENT: Nasal cannula O2 in place Lungs: Normal effort. Lungs clear to auscultation, no crackles or wheezes. Cardiovascular: Irregular rate and rhythm, no edema Abdomen: S/NT/ND; BS(+) Musculoskeletal:  unchanged Neurological: No new neurological deficits Wounds: Lower extremity with compression wraps Skin: clear   Mental state: Alert, oriented, cooperative    Lab Results: BMET    Component Value Date/Time   NA 137 11/06/2018 0511   K 3.7 11/06/2018 0511   CL 100 11/06/2018 0511   CO2 31 11/06/2018 0511   GLUCOSE 109 (H) 11/06/2018 0511   BUN 19 11/06/2018 0511   CREATININE 0.92 11/06/2018 0511   CALCIUM 8.0 (L) 11/06/2018 0511   GFRNONAA >60 11/06/2018 0511   GFRAA >60 11/06/2018 0511   CBC    Component Value Date/Time   WBC 8.0 11/06/2018 0511   RBC 2.69  (L) 11/06/2018 0511   HGB 7.4 (L) 11/06/2018 0511   HCT 24.5 (L) 11/06/2018 0511   PLT 304 11/06/2018 0511   MCV 91.1 11/06/2018 0511   MCH 27.5 11/06/2018 0511   MCHC 30.2 11/06/2018 0511   RDW 17.8 (H) 11/06/2018 0511   LYMPHSABS 0.2 (L) 11/06/2018 0511   MONOABS 0.8 11/06/2018 0511   EOSABS 0.0 11/06/2018 0511   BASOSABS 0.1 11/06/2018 0511    Medications: I have reviewed the patient's current medications.  Assessment/Plan:  Debility secondary to ischemic cardiomyopathy with congestive heart failure.  Continue CIR Ischemic cardiomyopathy with systolic heart failure Severe CAD.  Continue medical therapy Atrial fibrillation continue rate control amiodarone and Eliquis anticoagulation History of upper GI bleed.  Continue Protonix and monitoring of hematocrit    Length of stay, days: 2  Gordy Savers , MD 11/07/2018, 8:59 AM

## 2018-11-07 NOTE — Progress Notes (Signed)
Occupational Therapy Session Note  Patient Details  Name: Tonny Boisseau MRN: [id number] Date of Birth: 09-16-1965  Today's Date: 11/07/2018 OT Individual Time: 5573-2202 OT Individual Time Calculation (min): 76 min   Short Term Goals: Week 1:  OT Short Term Goal 1 (Week 1): Pt will complete BSC transfer with 2 assist  OT Short Term Goal 2 (Week 1): Pt will complete LB dressing sit<stand with 2 assist using LRAD to work on standing tolerance  OT Short Term Goal 3 (Week 1): Pt will complete 1/3 components of donning pants using AE as needed   Skilled Therapeutic Interventions/Progress Updates:    Pt greeted in bed, wanting to get up in the w/c. Started with applying brief bedlevel. He also needed assist for perihygiene prior due to incontinent BM. 2 assist required, with pt rolling Rt>Lt with Max A. Educated pt to have spouse bring in pants this weekend for staff to retrieve at front entrance. He wasn't aware that she was allowed to come into the building. Dependent for donning bledsoe brace + maxi transfer OOB. 3 assist for reciprocal hip scooting back in w/c. Pt required rest periods in between scoots, reporting exertion of 6/10 when helping [place]. 02 sats on 1L 95-97% at this time. Pt performed mini w/c push ups after, focus placed on tricep strengthening in prep for slideboard transfers. He completed 2 sets, 5 reps. For overall UB strengthening, pt propelled w/c approx 70 ft down hallway, requiring vcs and physical assist for turning and obstacle avoidance due to weakness. Pt was returned to room, 02 sats still >94% at this time. He was agreeable to remain up in w/c for 30 minutes before calling for staff assist back in bed. RN and NT made aware of this plan for carryover. Pt left with call bell in hand and safety belt fastened.   He c/o a little dizziness near end of session, rated 4/10. Per pt, this subsides with seated rest.    Therapy Documentation Precautions:  Precautions Precautions:  Fall Precaution Comments: Watch BP; VitalGo tilt bed Required Braces or Orthoses: Other Brace Other Brace: RLE Bledsoe brace locked in extension when up- okay to unlock at EOB  Restrictions Weight Bearing Restrictions: No RLE Weight Bearing: Weight bearing as tolerated Vital Signs: Therapy Vitals Pulse Rate: 87(apical) BP: 100/60(manual) Oxygen Therapy SpO2: 97 % Pain: No c/o pain during tx  Pain Assessment Pain Scale: 0-10 Pain Score: 0-No pain ADL: ADL Grooming: Setup Where Assessed-Grooming: Edge of bed Upper Body Bathing: Supervision/safety Where Assessed-Upper Body Bathing: Edge of bed Lower Body Bathing: Other (comment)(2 assist, bedlevel) Where Assessed-Lower Body Bathing: Bed level Upper Body Dressing: Minimal assistance(hospital gown) Where Assessed-Upper Body Dressing: Edge of bed Lower Body Dressing: Maximal assistance Where Assessed-Lower Body Dressing: Edge of bed Toileting: Not assessed Toilet Transfer: Not assessed Film/video editor: Not assessed ADL Comments: Standing balance not assessed due to pain, fatigue, and pt refusal to attempt  Praxis        Therapy/Group: Individual Therapy  Mesa Janus A Yamili Lichtenwalner 11/07/2018, 12:32 PM

## 2018-11-07 NOTE — Progress Notes (Signed)
Physical Therapy Session Note  Patient Details  Name: Aaron [name] MRN: [id number] Date of Birth: [birth year]  Today's Date: 11/07/2018 PT Individual Time: 1300-1400 PT Individual Time Calculation (min): 60 min   Short Term Goals: Week 1:  PT Short Term Goal 1 (Week 1): Pt will perform bed mobliity with mod assist  PT Short Term Goal 2 (Week 1): Pt will propell WC with min assist x 132ft.  PT Short Term Goal 3 (Week 1): Pt will perform bed<>WC transfer with max assist +2  PT Short Term Goal 4 (Week 1): Pt will ambulate 55ft with mod assist   Skilled Therapeutic Interventions/Progress Updates:    Pt received supine in bed. Per pt and RN pt was just assessed by medical team and found to have increase in fluid in chest, therefore pt started on IV lasix for fluid management. Per RN bed-level therapy this PM with limited pt exertion if possible. Supine BLE AAROM therex x 10 reps: quad sets, SAQ, hip abd, heel slides, SKFO, ankle pumps. Pt requires frequent rest breaks while performing exercises. Pt on 2L O2 throughout session, SpO2 96% and above. Pt is assist x 2 to scoot closer to [facility] as he has slid down in the bed. Pt left semi-reclined in bed with needs in reach.  Therapy Documentation Precautions:  Precautions Precautions: Fall Precaution Comments: Watch BP; VitalGo tilt bed Required Braces or Orthoses: Other Brace Other Brace: RLE Bledsoe brace locked in extension when up- okay to unlock at EOB  Restrictions Weight Bearing Restrictions: No RLE Weight Bearing: Weight bearing as tolerated    Therapy/Group: Individual Therapy   Peter Congo, PT, DPT  11/07/2018, 2:45 PM

## 2018-11-08 LAB — CBC
HCT: 23.5 % — ABNORMAL LOW (ref 39.0–52.0)
Hemoglobin: 7.1 g/dL — ABNORMAL LOW (ref 13.0–17.0)
MCH: 27.7 pg (ref 26.0–34.0)
MCHC: 30.2 g/dL (ref 30.0–36.0)
MCV: 91.8 fL (ref 80.0–100.0)
Platelets: 306 10*3/uL (ref 150–400)
RBC: 2.56 MIL/uL — ABNORMAL LOW (ref 4.22–5.81)
RDW: 17.8 % — ABNORMAL HIGH (ref 11.5–15.5)
WBC: 7.6 10*3/uL (ref 4.0–10.5)
nRBC: 0 % (ref 0.0–0.2)

## 2018-11-08 NOTE — Progress Notes (Signed)
Nurse Tech stated patient had red blood in his stool after having a bowel movement this morning. MD is on floor made aware of blood. MD stated he will see patient.

## 2018-11-08 NOTE — Progress Notes (Signed)
Aaron [name] is a 53 y.o. male admitted for CIR with general debility secondary to ischemic cardiomyopathy and congestive heart failure  Subjective: No new complaints.  Nursing staff has reported some scanty bright red rectal bleeding.  Patient does have a history of anemia and GI bleeding and underwent EGD last month.  Patient denies any rectal discomfort but does have some hemorrhoids.  Patient complained of some increasing dyspnea late morning yesterday and received additional furosemide 40 mg IV.  No further dyspnea reported today  Objective: Vital signs in last 24 hours: Temp:  [98 F (36.7 C)-99 F (37.2 C)] 99 F (37.2 C) (04/12 0600) Pulse Rate:  [60-97] 97 (04/12 0600) Resp:  [18] 18 (04/12 0600) BP: (97-132)/(56-115) 97/69 (04/12 0600) SpO2:  [93 %-100 %] 96 % (04/12 0600) Weight:  [132.9 kg] 132.9 kg (04/12 0600) Weight change: 1.361 kg Last BM Date: 11/07/18  Intake/Output from previous day: 04/11 0701 - 04/12 0700 In: 963.3 [P.O.:960; I.V.:3.3] Out: 1850 [Urine:1850]   Patient Vitals for the past 24 hrs:  BP Temp Temp src Pulse Resp SpO2 Weight  11/08/18 0600 97/69 99 F (37.2 C) Oral 97 18 96 % 132.9 kg  11/07/18 1922 (!) 100/56 98.5 F (36.9 C) Oral 90 18 100 % -  11/07/18 1735 104/62 - - 89 - - -  11/07/18 1503 (!) 97/56 98 F (36.7 C) Oral 90 18 98 % -  11/07/18 1500 (!) 132/115 98.1 F (36.7 C) Oral 60 18 93 % -  11/07/18 1100 - - - - - 97 % -     Physical Exam General: No apparent distress obese HEENT: not dry nasal cannula O2 in use Lungs: Normal effort.  Generally diminished breath sounds.  Decreased breath sounds right lower lung Cardiovascular: Irregular heart rate with controlled ventricular response Abdomen: S/NT/ND; BS(+) Musculoskeletal:  unchanged Neurological: No new neurological deficits Extremities-lower extremity edema with compression wraps Skin: clear  Mental state: Alert, oriented, cooperative    Lab Results: BMET     Component Value Date/Time   NA 137 11/06/2018 0511   K 3.7 11/06/2018 0511   CL 100 11/06/2018 0511   CO2 31 11/06/2018 0511   GLUCOSE 109 (H) 11/06/2018 0511   BUN 19 11/06/2018 0511   CREATININE 0.92 11/06/2018 0511   CALCIUM 8.0 (L) 11/06/2018 0511   GFRNONAA >60 11/06/2018 0511   GFRAA >60 11/06/2018 0511   CBC    Component Value Date/Time   WBC 8.0 11/06/2018 0511   RBC 2.69 (L) 11/06/2018 0511   HGB 7.4 (L) 11/06/2018 0511   HCT 24.5 (L) 11/06/2018 0511   PLT 304 11/06/2018 0511   MCV 91.1 11/06/2018 0511   MCH 27.5 11/06/2018 0511   MCHC 30.2 11/06/2018 0511   RDW 17.8 (H) 11/06/2018 0511   LYMPHSABS 0.2 (L) 11/06/2018 0511   MONOABS 0.8 11/06/2018 0511   EOSABS 0.0 11/06/2018 0511   BASOSABS 0.1 11/06/2018 0511     Medications: I have reviewed the patient's current medications.  Assessment/Plan:  Low-volume bright red rectal bleeding.  Will review CBC.  Remains on Eliquis anticoagulation Ischemic cardiomyopathy with chronic systolic heart failure Severe CAD Chronic atrial fibrillation History of upper GI bleed.  Continue Protonix.  Review CBC  Length of stay, days: 3  Gordy Savers , MD 11/08/2018, 8:57 AM

## 2018-11-08 NOTE — IPOC Note (Signed)
Overall Plan of Care [facility]) Patient Details Name: Aaron [name] MRN: [id number] DOB: Jun 06, 1966  Admitting Diagnosis: Debility  Hospital Problems: Principal Problem:   Debility Active Problems:   Acute systolic CHF (congestive heart failure) (HCC)   Atrial fibrillation with rapid ventricular response (HCC)     Functional Problem List: Nursing Bowel, Edema, Endurance, Medication Management, Motor, Skin Integrity, Sensory  PT Balance, Edema, Endurance, Motor, Perception, Safety, Sensory, Skin Integrity  OT Balance, Pain, Edema, Safety, Endurance, Sensory, Motor, Skin Integrity  SLP    TR         Basic ADL's: OT Grooming, Bathing, Dressing, Toileting     Advanced  ADL's: OT Simple Meal Preparation     Transfers: PT Bed Mobility, Bed to Chair, Car, Occupational psychologist, Research scientist (life sciences): PT Ambulation, Psychologist, prison and probation services, Stairs     Additional Impairments: OT None  SLP        TR      Anticipated Outcomes Item Anticipated Outcome  Self Feeding No goal  Swallowing      Basic self-care  supervision-Min A  Toileting  Min A   Bathroom Transfers Min A   Bowel/Bladder  Mod assist  Transfers  Min assist with LRAD   Locomotion  Supervision assist WC level. min-mod assist ambulatory   Communication     Cognition     Pain  < 4  Safety/Judgment  Supervision   Therapy Plan: PT Intensity: Minimum of 1-2 x/day ,45 to 90 minutes PT Frequency: 5 out of 7 days PT Duration Estimated Length of Stay: 24-28 days  OT Intensity: Minimum of 1-2 x/day, 45 to 90 minutes OT Frequency: 5 out of 7 days OT Duration/Estimated Length of Stay: 3-4 weeks       Team Interventions: Nursing Interventions Patient/Family Education, Bowel Management, Disease Management/Prevention, Skin Care/Wound Management, Medication Management, Discharge Planning  PT interventions Ambulation/gait training, Balance/vestibular training, Discharge planning, Community reintegration, Disease  management/prevention, DME/adaptive equipment instruction, Functional electrical stimulation, Functional mobility training, Patient/family education, Pain management, Neuromuscular re-education, Psychosocial support, Skin care/wound management, Splinting/orthotics, Therapeutic Exercise, Therapeutic Activities, Stair training, UE/LE Coordination activities, UE/LE Strength taining/ROM, Visual/perceptual remediation/compensation, Wheelchair propulsion/positioning  OT Interventions Warden/ranger, Community reintegration, Disease mangement/prevention, Neuromuscular re-education, Equities trader education, Self Care/advanced ADL retraining, Therapeutic Exercise, UE/LE Coordination activities, Wheelchair propulsion/positioning, UE/LE Strength taining/ROM, Therapeutic Activities, Psychosocial support, Pain management, Functional mobility training, DME/adaptive equipment instruction, Skin care/wound managment, Discharge planning, Cognitive remediation/compensation  SLP Interventions    TR Interventions    SW/CM Interventions Discharge Planning, Psychosocial Support, Patient/Family Education   Barriers to Discharge MD  Medical stability and obesity, femoral nerve injury  Nursing Weight, New oxygen, Medical stability    PT Inaccessible home environment, Decreased caregiver support, Home environment Best boy, Insurance for SNF coverage, Weight bearing restrictions    OT Medical stability, Weight, New oxygen, Wound Care    SLP      SW       Team Discharge Planning: Destination: PT-Home ,OT- Home , SLP-  Projected Follow-up: PT-Home health PT, OT-  Home health OT, SLP-  Projected Equipment Needs: PT-Wheelchair (measurements), Wheelchair cushion (measurements), Rolling walker with 5" wheels, To be determined, OT- To be determined, SLP-  Equipment Details: PT- , OT-  Patient/family involved in discharge planning: PT- Patient,  OT-Patient, SLP-   MD ELOS: 17-21d Medical Rehab Prognosis:   Good Assessment:  53 year old male admitted on 10/01/18 withabdominal pain peripheral edema and shortness of breath. He was noted to have anasarca with  abnormal LFTs, AKI and RLL effusion with concerns of pneumonia due to leukocytosis. Abdominal ultrasound done revealing cholelithiasis with fatty liver and liver Doppler was negative for thrombosis. 2D echo revealed diffuse hypokinesis with ejection fraction of 15%. Dr. Loney Hering consulted for input and felt that AKI/elevated LFTs likely due to congestion. He underwent cardiac cath by Dr. Bascom Levels revealing severe three-vessel CAD with global hypokinesis with LVEF 15% and moderate pulmonary hypertension.Dr. Dorris Fetch did not feel patient to be a CABG candidate and medical treatment recommended. He did develop A. fib with RVR requiring IV amiodarone for rate control and was started on heparin. He did develop right thigh pain with quad weakness felt to be due to femoral nerve compression and Dr. August Saucer recommended Bledsoe brace for support when up and that hematoma would resolve with time..   Now requiring 24/7 Rehab RN,MD, as well as CIR level PT, OT .  Treatment team will focus on ADLs and mobility with goals set at [facility] overall See Team Conference Notes for weekly updates to the plan of care

## 2018-11-09 ENCOUNTER — Inpatient Hospital Stay (HOSPITAL_COMMUNITY): Payer: Self-pay | Admitting: Occupational Therapy

## 2018-11-09 ENCOUNTER — Inpatient Hospital Stay (HOSPITAL_COMMUNITY): Payer: Self-pay

## 2018-11-09 ENCOUNTER — Inpatient Hospital Stay (HOSPITAL_COMMUNITY): Payer: Self-pay | Admitting: Physical Therapy

## 2018-11-09 LAB — DIGOXIN LEVEL: Digoxin Level: 0.7 ng/mL — ABNORMAL LOW (ref 0.8–2.0)

## 2018-11-09 MED ORDER — SENNOSIDES-DOCUSATE SODIUM 8.6-50 MG PO TABS
2.0000 | ORAL_TABLET | Freq: Two times a day (BID) | ORAL | Status: DC
  Administered 2018-11-09 – 2018-11-11 (×5): 2 via ORAL
  Filled 2018-11-09 (×7): qty 2

## 2018-11-09 MED ORDER — SIMETHICONE 80 MG PO CHEW: 80.0000 mg | CHEWABLE_TABLET | Freq: Four times a day (QID) | ORAL | Status: DC | PRN

## 2018-11-09 NOTE — Progress Notes (Signed)
[facility] PHYSICAL MEDICINE & REHABILITATION PROGRESS NOTE   Subjective/Complaints:  No issues overnite, starting to feel more gassy and nauseated  ROS- neg CP, SOB, N/V/D   Objective:   No results found. Recent Labs    11/08/18 0904  WBC 7.6  HGB 7.1*  HCT 23.5*  PLT 306   No results for input(s): NA, K, CL, CO2, GLUCOSE, BUN, CREATININE, CALCIUM in the last 72 hours.  Intake/Output Summary (Last 24 hours) at 11/09/2018 0723 Last data filed at 11/09/2018 4982 Gross per 24 hour  Intake 740 ml  Output 625 ml  Net 115 ml     Physical Exam: Vital Signs Blood pressure 104/69, pulse (!) 104, temperature 98.2 F (36.8 C), temperature source Oral, resp. rate 16, weight 132.9 kg, SpO2 95 %.   General: No acute distress Mood and affect are appropriate Heart: Regular rate and rhythm no rubs murmurs or extra sounds Lungs: Clear to auscultation, breathing unlabored, no rales or wheezes Abdomen: Positive bowel sounds, soft nontender to palpation, nondistended Extremities: No clubbing, cyanosis, or edema Skin: No evidence of breakdown, no evidence of rash Neurologic: Cranial nerves II through XII intact, motor strength is 5/5 in bilateral deltoid, bicep, tricep, grip,2- right and 3- left  hip flexor, knee extensors, ankle dorsiflexor and plantar flexor Sensory exam normal sensation to light touch and proprioception in bilateral upper and lower extremities Cerebellar exam normal finger to nose to finger as well as heel to shin in bilateral upper and lower extremities Musculoskeletal: Full range of motion in all 4 extremities. No joint swelling   Assessment/Plan: 1. Functional deficits secondary to severe debility CHF which require 3+ hours per day of interdisciplinary therapy in a comprehensive inpatient rehab setting.  Physiatrist is providing close team supervision and 24 hour management of active medical problems listed below.  Physiatrist and rehab team continue to assess  barriers to discharge/monitor patient progress toward functional and medical goals  Care Tool:  Bathing    Body parts bathed by patient: Right arm, Left arm, Chest, Abdomen, Front perineal area, Face   Body parts bathed by helper: Buttocks, Right lower leg, Left lower leg     Bathing assist Assist Level: 2 Helpers     Upper Body Dressing/Undressing Upper body dressing   What is the patient wearing?: Hospital gown only    Upper body assist Assist Level: Minimal Assistance - Patient > 75%    Lower Body Dressing/Undressing Lower body dressing      What is the patient wearing?: Incontinence brief     Lower body assist Assist for lower body dressing: 2 Helpers     Toileting Toileting Toileting Activity did not occur (Clothing management and hygiene only): N/A (no void or bm)  Toileting assist Assist for toileting: 2 Helpers     Transfers Chair/bed transfer  Transfers assist     Chair/bed transfer assist level: Dependent - mechanical lift     Locomotion Ambulation   Ambulation assist   Ambulation activity did not occur: Safety/medical concerns          Walk 10 feet activity   Assist  Walk 10 feet activity did not occur: Safety/medical concerns        Walk 50 feet activity   Assist Walk 50 feet with 2 turns activity did not occur: Safety/medical concerns         Walk 150 feet activity   Assist Walk 150 feet activity did not occur: Safety/medical concerns  Walk 10 feet on uneven surface  activity   Assist Walk 10 feet on uneven surfaces activity did not occur: Safety/medical concerns         Wheelchair     Assist Will patient use wheelchair at discharge?: Yes      Wheelchair assist level: Minimal Assistance - Patient > 75% Max wheelchair distance: 75    Wheelchair 50 feet with 2 turns activity    Assist        Assist Level: Minimal Assistance - Patient > 75%   Wheelchair 150 feet activity      Assist Wheelchair 150 feet activity did not occur: Safety/medical concerns        Medical Problem List and Plan: 1.Deconditioningsecondary to congestive heart failure, ischemic cardiomyopathy CIR PT, OT Have written cardiac precaution based on EF 15% 2. Antithrombotics: -DVT/anticoagulation:Pharmaceutical:Other (comment)--Eliquis -antiplatelet therapy: Low-dose ASA. 3. Pain Management:  4. Mood:We will monitor and involve neuropsychology as needed -antipsychotic agents:None 5. Neuropsych: This patientiscapable of making decisions on hisown behalf. 6. Skin/Wound Care:routine pressure relief measures. 7. Fluids/Electrolytes/Nutrition:Monitor I/O. Check lytes in am.  8.Systolic CHF/AKI: Heart healthy diet monitor weights daily. Managed with losartan, Lasix,digoxin,Coreg and Lipitor.Monitor renal status with serial checks.  9.Severe CAD: Aggressive medical therapy recommended at this time. On Coreg, losartan, ASA and statin. Off Plavix due to needingeliquis for Afib 10.A. fib with RVR: Monitor heart rate twice daily. Continue digoxin,Coreg and amiodarone.On Eliquis Vitals:   11/09/18 0451 11/09/18 0515  BP: 104/69   Pulse: (!) 117 (!) 104  Resp: 16   Temp: 98.2 F (36.8 C)   SpO2: 95%   rate mildly elevated as expected due to low CO, BP a little soft would not increase coreg check dig level 11.Right thigh hematoma with femoral plexopathy: Bledsoe brace for support. Monitor with for recovery 12.Upper GI bleed: Monitor H&H with serial checks. Continue Protonix twice daily 13.ABLA/iron deficiency anemia/folic acid deficiency:Low iron with elevated ferritin-- Add folic acid. Question IV iron for supplementation.  Hgb stable 4/12 but may be contributing to tachy 14. Hyponatremia: Recheck lytes in am.  15.  Hx of recent ileus, pt with good bowel sounds, may add mylicon  LOS: 4 days A FACE TO FACE EVALUATION  WAS PERFORMED  Erick Colace 11/09/2018, 7:23 AM

## 2018-11-09 NOTE — Progress Notes (Signed)
Physical Therapy Session Note  Patient Details  Name: Aaron [name] MRN: [id number] Date of Birth: [birth year]  Today's Date: 11/09/2018 PT Individual Time: 1120-1230 PT Individual Time Calculation (min): 70 min   Short Term Goals: Week 1:  PT Short Term Goal 1 (Week 1): Pt will perform bed mobliity with mod assist  PT Short Term Goal 2 (Week 1): Pt will propell WC with min assist x 169f.  PT Short Term Goal 3 (Week 1): Pt will perform bed<>WC transfer with max assist +2  PT Short Term Goal 4 (Week 1): Pt will ambulate 156fwith mod assist   Skilled Therapeutic Interventions/Progress Updates: Pt presented in bed agreeable to therapy. Pt denies pain throughout session. Pt requesting CPM placed on after therapy. Pt agreeable to try EOB activity, BP checked prior 100/54 pulse 82. PTA noted Bledsoe brace required readjustment and refitting prior to bed mobility. Received assistance from BeWildroseT for re-adjustment. Pt performed supine to sit with modA x 2 for BLE placement, sequencing, and truncal support. Pt indicating no s/s orthostatic hypotension upon sitting, BP 99/52 pulse 88. Pt able to tolerate sitting EOB x 5 min with encouragement with PTA noting heavy use of BUE for support. Pt noted to have increased dyspnea however vitals remained stable. Pt encouraged to perform PLB while sitting erect with PTA providing education on benefits of sitting upright vs using bed. Pt returned to supine maxA x 2 with dependent scoot to [facility] pt instructed in and performed gentle AAROM with use of gait belt as assistive device. Pt provided with HEP for heel slides, hip abd, and AASLR with pt able to perform 3-5. PTA placed CPM on RLE, turned on with pt stating comfort with range. Pt left with call bell within reach and needs met with nsg present for lab draw.      Therapy Documentation Precautions:  Precautions Precautions: Fall Precaution Comments: Watch BP; VitalGo tilt bed Required Braces or  Orthoses: Other Brace Other Brace: RLE Bledsoe brace locked in extension when up- okay to unlock at EOB  Restrictions Weight Bearing Restrictions: No RLE Weight Bearing: Weight bearing as tolerated General:   Vital Signs: Therapy Vitals Temp: 98.2 F (36.8 C) Temp Source: Oral Pulse Rate: 96 Resp: 18 BP: 100/68 Patient Position (if appropriate): Lying Oxygen Therapy SpO2: 97 % O2 Device: Nasal Cannula O2 Flow Rate (L/min): 2 L/min Pain: Pain Assessment Pain Scale: 0-10 Pain Score: 0-No pain    Therapy/Group: Individual Therapy  Bell Cai   11/09/2018, 3:51 PM

## 2018-11-09 NOTE — Plan of Care (Signed)
  Problem: Consults Goal: RH GENERAL PATIENT EDUCATION Description See Patient Education module for education specifics. Outcome: Progressing Goal: Skin Care Protocol Initiated - if Braden Score 18 or less Description If consults are not indicated, leave blank or document N/A Outcome: Progressing   Problem: RH BOWEL ELIMINATION Goal: RH STG MANAGE BOWEL WITH ASSISTANCE Description STG Manage Bowel with mod Assistance.  Outcome: Progressing   Problem: RH SKIN INTEGRITY Goal: RH STG SKIN FREE OF INFECTION/BREAKDOWN Description Patients skin will remain free from further infection or breakdown with mod assist.  Outcome: Progressing Goal: RH STG MAINTAIN SKIN INTEGRITY WITH ASSISTANCE Description STG Maintain Skin Integrity With mod Assistance.  Outcome: Progressing Goal: RH STG ABLE TO PERFORM INCISION/WOUND CARE W/ASSISTANCE Description STG Able To Perform Incision/Wound Care With mod Assistance.  Outcome: Progressing

## 2018-11-09 NOTE — Progress Notes (Signed)
Patient slept in brief intervals for the first half of the night. Incontinent of stool x2. No prns noted this shift.

## 2018-11-09 NOTE — Progress Notes (Signed)
Occupational Therapy Session Note  Patient Details  Name: Aaron [name] MRN: [id number] Date of Birth: [birth year]  Today's Date: 11/09/2018 OT Individual Time: 6861-6837 OT Individual Time Calculation (min): 72 min    Short Term Goals: Week 1:  OT Short Term Goal 1 (Week 1): Pt will complete BSC transfer with 2 assist  OT Short Term Goal 2 (Week 1): Pt will complete LB dressing sit<stand with 2 assist using LRAD to work on standing tolerance  OT Short Term Goal 3 (Week 1): Pt will complete 1/3 components of donning pants using AE as needed    Skilled Therapeutic Interventions/Progress Updates:    Upon entering the room, pt supine in bed and reports feeling very fatigue. Pt agreeable to OT intervention but requesting to "take it easy" because he was feeling nauseated. OT educated pt on B UE HEP with use of level 1 theraband and paper handout of exercises. Pt demonstrated 3 sets of 10 bicep curls, shoulder elevation, shoulder diagonals, and alternating punches with multiple rest breaks secondary to fatigue and min verbal cuing for proper technique. Pt needing min cuing for pursed lip breathing as well. Pt remained in bed at end of session with call bell and all needed items within reach.  Therapy Documentation Precautions:  Precautions Precautions: Fall Precaution Comments: Watch BP; VitalGo tilt bed Required Braces or Orthoses: Other Brace Other Brace: RLE Bledsoe brace locked in extension when up- okay to unlock at EOB  Restrictions Weight Bearing Restrictions: No RLE Weight Bearing: Weight bearing as tolerated ADL: ADL Grooming: Setup Where Assessed-Grooming: Edge of bed Upper Body Bathing: Supervision/safety Where Assessed-Upper Body Bathing: Edge of bed Lower Body Bathing: Other (comment)(2 assist, bedlevel) Where Assessed-Lower Body Bathing: Bed level Upper Body Dressing: Minimal assistance(hospital gown) Where Assessed-Upper Body Dressing: Edge of bed Lower Body Dressing:  Maximal assistance Where Assessed-Lower Body Dressing: Edge of bed Toileting: Not assessed Toilet Transfer: Not assessed Film/video editor: Not assessed ADL Comments: Standing balance not assessed due to pain, fatigue, and pt refusal to attempt    Therapy/Group: Individual Therapy  Alen Bleacher 11/09/2018, 9:48 AM

## 2018-11-09 NOTE — Progress Notes (Signed)
Physical Therapy Session Note  Patient Details  Name: Aaron [name] MRN: [id number] Date of Birth: [birth year]  Today's Date: 11/09/2018 PT Individual Time: 1330-1400 PT Individual Time Calculation (min): 30 min   Short Term Goals: Week 1:  PT Short Term Goal 1 (Week 1): Pt will perform bed mobliity with mod assist  PT Short Term Goal 2 (Week 1): Pt will propell WC with min assist x 115ft.  PT Short Term Goal 3 (Week 1): Pt will perform bed<>WC transfer with max assist +2  PT Short Term Goal 4 (Week 1): Pt will ambulate 16ft with mod assist   Skilled Therapeutic Interventions/Progress Updates:     Patient in bed upon PT arrival. Patient alert and agreeable to PT session. L LE in CPM at beginning session.  Therapeutic Activity: Bed Mobility: Patient performed supine to/from sit with HOB elevated 75 degrees with mod A for L LE management and trunk support with increased time due to poor activity tolerance. Provided verbal cues for pushing through UE's to sit up. Patient performed scooting up in bed x2 using B UE on bed rails with mod A of 1 and with mod A of 2 x1 at end of session, increased assist due to fatigue.  Patient tolerated sitting EOB for 6 minutes performing pursed lip breathing for mild SOB, cued patient for erect posture for improved breath support.  Patient requires increased time and multiple rest breaks during functional mobility due to decreased activity tolerance.   Patient in bed at end of session with breaks locked, bed alarm set, CPM applied to R LE, and all needs within reach.   Therapy Documentation Precautions:  Precautions Precautions: Fall Precaution Comments: Watch BP; VitalGo tilt bed Required Braces or Orthoses: Other Brace Other Brace: RLE Bledsoe brace locked in extension when up- okay to unlock at EOB  Restrictions Weight Bearing Restrictions: No RLE Weight Bearing: Weight bearing as tolerated Vital Signs: Therapy Vitals Temp: 98.2 F (36.8 C) Temp  Source: Oral Pulse Rate: 96 Resp: 18 BP: 100/68 Patient Position (if appropriate): Lying BP: 92/66 Patient position: sitting BP: 99/68 Patient position: sitting after 2 minutes Oxygen Therapy SpO2: 97 % O2 Device: Nasal Cannula O2 Flow Rate (L/min): 2 L/min Pain: Pain Assessment Pain Scale: 0-10 Pain Score: 0-No pain   Therapy/Group: Individual Therapy  Helayne Seminole, PT, DPT 11/09/2018, 4:07 PM

## 2018-11-09 NOTE — Plan of Care (Signed)
Due to the current state of emergency, patients may not be receiving their 3-hours of Medicare-mandated therapy.   

## 2018-11-09 NOTE — Progress Notes (Signed)
Physical Therapy Session Note  Patient Details  Name: Aaron [name] MRN: [id number] Date of Birth: 1966/04/27  Today's Date: 11/09/2018 PT Individual Time: 5997-7414 PT Individual Time Calculation (min): 28 min   Short Term Goals: Week 1:  PT Short Term Goal 1 (Week 1): Pt will perform bed mobliity with mod assist  PT Short Term Goal 2 (Week 1): Pt will propell WC with min assist x 176f.  PT Short Term Goal 3 (Week 1): Pt will perform bed<>WC transfer with max assist +2  PT Short Term Goal 4 (Week 1): Pt will ambulate 150fwith mod assist   Skilled Therapeutic Interventions/Progress Updates:    Patient received in bed, pleasant and willing to work with therapy but reporting urine incontinence. Current gown found to be dirty and patient performed rolling with maxA multiple times to get gown out from under body, placed fresh gown with [place] then focused on functional bed exercises for B LEs and UEs due to time limitations of session today. He was left in bed with all needs met and positioned to comfort.   Therapy Documentation Precautions:  Precautions Precautions: Fall Precaution Comments: Watch BP; VitalGo tilt bed Required Braces or Orthoses: Other Brace Other Brace: RLE Bledsoe brace locked in extension when up- okay to unlock at EOB  Restrictions Weight Bearing Restrictions: No RLE Weight Bearing: Weight bearing as tolerated General:   Vital Signs:  Pain: Pain Assessment Pain Scale: 0-10 Pain Score: 0-No pain    Therapy/Group: Individual Therapy   KrDeniece ReeT, DPT, CBIS  Supplemental Physical Therapist [facility]  Pager [phone] [phone]  11/09/2018, 12:43 PM

## 2018-11-10 ENCOUNTER — Inpatient Hospital Stay (HOSPITAL_COMMUNITY): Payer: Self-pay

## 2018-11-10 ENCOUNTER — Inpatient Hospital Stay (HOSPITAL_COMMUNITY): Payer: Self-pay | Admitting: Physical Therapy

## 2018-11-10 ENCOUNTER — Inpatient Hospital Stay (HOSPITAL_COMMUNITY): Payer: Self-pay | Admitting: Occupational Therapy

## 2018-11-10 NOTE — Progress Notes (Signed)
Physical Therapy Session Note  Patient Details  Name: Aaron [name] MRN: [id number] Date of Birth: [birth year]  Today's Date: 11/10/2018 PT Individual Time: 1445-1540 PT Individual Time Calculation (min): 55 min   Short Term Goals: Week 1:  PT Short Term Goal 1 (Week 1): Pt will perform bed mobliity with mod assist  PT Short Term Goal 2 (Week 1): Pt will propell WC with min assist x 177ft.  PT Short Term Goal 3 (Week 1): Pt will perform bed<>WC transfer with max assist +2  PT Short Term Goal 4 (Week 1): Pt will ambulate 43ft with mod assist   Skilled Therapeutic Interventions/Progress Updates:    Patient in supine requesting to perform in bed activities.  Patient agreeable to standing in Vital Go bed.  Requesting to toilet first so rolled with max A to place bedpan.  Removed and +2 A for hygiene.  Patient reports Bledsoe brace had been disassembled at one point so time spent to reassemble correctly and don on R LE.  Placed straps on hips and knees and lifted initially to 30 degrees, then to 40 degrees, then 45 and stopped due to c/o burning R knee and due to time.  Patient lifted total of approximately 15 minutes.  IN standing performed glut and L quad sets x 10 5 sec hold.  See below for BP's.  Patient requesting CPM for R knee and placed in CPM.  Left with call bell and needs in reach.   0 Degrees  BP 131/111(machine low battery)  HR 70 30 degrees   BP 92/44     HR 82 40 degrees  BP 90/556     HR 77 45 degrees   BP 87/47     HR 79  Therapy Documentation Precautions:  Precautions Precautions: Fall Precaution Comments: Watch BP; VitalGo tilt bed Required Braces or Orthoses: Other Brace Other Brace: RLE Bledsoe brace locked in extension when up- okay to unlock at EOB  Restrictions Weight Bearing Restrictions: No RLE Weight Bearing: Weight bearing as tolerated Pain: Pain Assessment Pain Scale: Faces Faces Pain Scale: Hurts little more Pain Type: Acute pain Pain Location: Knee Pain  Orientation: Right Pain Descriptors / Indicators: Burning Pain Onset: With Activity Pain Intervention(s): Repositioned;Rest;Other (Comment)(CPM applied)    Therapy/Group: Individual Therapy  [name]  [name], PT 11/10/2018, 5:19 PM

## 2018-11-10 NOTE — Progress Notes (Signed)
Occupational Therapy Session Note  Patient Details  Name: Aaron [name] MRN: [id number] Date of Birth: 20-Aug-1965  Today's Date: 11/10/2018 OT Individual Time: 4097-3532 OT Individual Time Calculation (min): 70 min    Short Term Goals: Week 1:  OT Short Term Goal 1 (Week 1): Pt will complete BSC transfer with 2 assist  OT Short Term Goal 2 (Week 1): Pt will complete LB dressing sit<stand with 2 assist using LRAD to work on standing tolerance  OT Short Term Goal 3 (Week 1): Pt will complete 1/3 components of donning pants using AE as needed   Skilled Therapeutic Interventions/Progress Updates:    Upon entering the room, pt supine in bed and reports resting well. Pt agreeable to OT intervention. Bledsoe brace donned and unlocked for comfort with transfer. Pt rolling L <> R with max A and use of bed rails and second helper placing maximove sling. Pt transferred into wheelchair with maxi move and remains on 2 L O2 via [place]. Pt's BP taken with results of 97/64 with pt reporting, " that's pretty normal for me" and pt is asymptomatic. Pt sitting in wheelchair at sink for grooming tasks with set up A to obtain needed items. Multiple rest breaks needed secondary to fatigue. Pt with increased coughing but likely due to being upright in chair. Pt remained seated in wheelchair with chair alarm safety belt donned and call bell within reach upon exiting the room.   Therapy Documentation Precautions:  Precautions Precautions: Fall Precaution Comments: Watch BP; VitalGo tilt bed Required Braces or Orthoses: Other Brace Other Brace: RLE Bledsoe brace locked in extension when up- okay to unlock at EOB  Restrictions Weight Bearing Restrictions: No RLE Weight Bearing: Weight bearing as tolerated ADL: ADL Grooming: Setup Where Assessed-Grooming: Edge of bed Upper Body Bathing: Supervision/safety Where Assessed-Upper Body Bathing: Edge of bed Lower Body Bathing: Other (comment)(2 assist, bedlevel) Where  Assessed-Lower Body Bathing: Bed level Upper Body Dressing: Minimal assistance(hospital gown) Where Assessed-Upper Body Dressing: Edge of bed Lower Body Dressing: Maximal assistance Where Assessed-Lower Body Dressing: Edge of bed Toileting: Not assessed Toilet Transfer: Not assessed Film/video editor: Not assessed ADL Comments: Standing balance not assessed due to pain, fatigue, and pt refusal to attempt    Therapy/Group: Individual Therapy  Alen Bleacher 11/10/2018, 10:32 AM

## 2018-11-10 NOTE — Progress Notes (Signed)
Physical Therapy Session Note  Patient Details  Name: Aaron [name] MRN: [id number] Date of Birth: 12/23/1965  Today's Date: 11/10/2018 PT Individual Time: 1000-1110 PT Individual Time Calculation (min): 70 min   Short Term Goals: Week 1:  PT Short Term Goal 1 (Week 1): Pt will perform bed mobliity with mod assist  PT Short Term Goal 2 (Week 1): Pt will propell WC with min assist x 176f.  PT Short Term Goal 3 (Week 1): Pt will perform bed<>WC transfer with max assist +2  PT Short Term Goal 4 (Week 1): Pt will ambulate 152fwith mod assist   Skilled Therapeutic Interventions/Progress Updates: Pt presented in w/c c/o pain in L ribs 8/10. Pt noted to be in significant sacral sitting and rotated to L. Pt agreeable to pain meds this session as encouraged pt to stay up for therapy session. Pt was repositioned and pillow placed behind pt for support with pt indicating significant relief. BP checked after repositioning 107/59, HR 98, SpO2 99. Pt transported around unit for temporary distraction. Pt agreeable to try UBE for conditioning. As transporting to rehab gym pt indicating urgent need for BM. Pt returned to room and performed Maxi Move transfer to bed. Due to urgency unable to [facility]. Pt was able to perform rolling L/R with mod to maxA for managing brief and placing bedpan, nsg notified of pt's position as PTA checked for different w/c, chair back for increased support. PTA unable to locate more appropriate seating option advised to continue to use pillow for back support as pt indicated decreased pain with support. PTA and pt discussed home set up upon d/c as pt will most likely live on ground floor with partially finished bathroom. Pt left with call bell within reach and needs met.      Therapy Documentation Precautions:  Precautions Precautions: Fall Precaution Comments: Watch BP; VitalGo tilt bed Required Braces or Orthoses: Other Brace Other Brace: RLE Bledsoe brace locked in  extension when up- okay to unlock at EOB  Restrictions Weight Bearing Restrictions: No RLE Weight Bearing: Weight bearing as tolerated    Therapy/Group: Individual Therapy  Rhylin Venters  Kimm Ungaro, PTA  11/10/2018, 1:23 PM

## 2018-11-10 NOTE — Progress Notes (Signed)
[place] PHYSICAL MEDICINE & REHABILITATION PROGRESS NOTE   Subjective/Complaints:  No further abd discomfort , had BM yesterday, appetite still poor  ROS- neg CP, SOB, N/V/D   Objective:   No results found. Recent Labs    11/08/18 0904  WBC 7.6  HGB 7.1*  HCT 23.5*  PLT 306   No results for input(s): NA, K, CL, CO2, GLUCOSE, BUN, CREATININE, CALCIUM in the last 72 hours.  Intake/Output Summary (Last 24 hours) at 11/10/2018 0655 Last data filed at 11/10/2018 0543 Gross per 24 hour  Intake 360 ml  Output 1300 ml  Net -940 ml     Physical Exam: Vital Signs Blood pressure 107/68, pulse 77, temperature 97.7 F (36.5 C), temperature source Oral, resp. rate 20, weight 132.9 kg, SpO2 95 %.   General: No acute distress Mood and affect are appropriate Heart: Regular rate and rhythm no rubs murmurs or extra sounds Lungs: Clear to auscultation, breathing unlabored, no rales or wheezes Abdomen: Positive bowel sounds, soft nontender to palpation, nondistended Extremities: No clubbing, cyanosis, or edema Skin: No evidence of breakdown, no evidence of rash Neurologic: Cranial nerves II through XII intact, motor strength is 5/5 in bilateral deltoid, bicep, tricep, grip,2- right and 3- left  hip flexor, knee extensors, ankle dorsiflexor and plantar flexor Sensory exam normal sensation to light touch and proprioception in bilateral upper and lower extremities Cerebellar exam normal finger to nose to finger as well as heel to shin in bilateral upper and lower extremities Musculoskeletal: Full range of motion in all 4 extremities. No joint swelling   Assessment/Plan: 1. Functional deficits secondary to severe debility CHF which require 3+ hours per day of interdisciplinary therapy in a comprehensive inpatient rehab setting.  Physiatrist is providing close team supervision and 24 hour management of active medical problems listed below.  Physiatrist and rehab team continue to assess  barriers to discharge/monitor patient progress toward functional and medical goals  Care Tool:  Bathing    Body parts bathed by patient: Right arm, Left arm, Chest, Abdomen, Front perineal area, Face   Body parts bathed by helper: Buttocks, Right lower leg, Left lower leg     Bathing assist Assist Level: 2 Helpers     Upper Body Dressing/Undressing Upper body dressing   What is the patient wearing?: Hospital gown only    Upper body assist Assist Level: Minimal Assistance - Patient > 75%    Lower Body Dressing/Undressing Lower body dressing      What is the patient wearing?: Incontinence brief     Lower body assist Assist for lower body dressing: 2 Helpers     Toileting Toileting Toileting Activity did not occur (Clothing management and hygiene only): N/A (no void or bm)  Toileting assist Assist for toileting: 2 Helpers     Transfers Chair/bed transfer  Transfers assist     Chair/bed transfer assist level: Dependent - mechanical lift     Locomotion Ambulation   Ambulation assist   Ambulation activity did not occur: Safety/medical concerns          Walk 10 feet activity   Assist  Walk 10 feet activity did not occur: Safety/medical concerns        Walk 50 feet activity   Assist Walk 50 feet with 2 turns activity did not occur: Safety/medical concerns         Walk 150 feet activity   Assist Walk 150 feet activity did not occur: Safety/medical concerns  Walk 10 feet on uneven surface  activity   Assist Walk 10 feet on uneven surfaces activity did not occur: Safety/medical concerns         Wheelchair     Assist Will patient use wheelchair at discharge?: Yes      Wheelchair assist level: Minimal Assistance - Patient > 75% Max wheelchair distance: 75    Wheelchair 50 feet with 2 turns activity    Assist        Assist Level: Minimal Assistance - Patient > 75%   Wheelchair 150 feet activity      Assist Wheelchair 150 feet activity did not occur: Safety/medical concerns        Medical Problem List and Plan: 1.Deconditioningsecondary to congestive heart failure, ischemic cardiomyopathy CIR PT, OT Team conference in am.Have written cardiac precaution based on EF 15% 2. Antithrombotics: -DVT/anticoagulation:Pharmaceutical:Other (comment)--Eliquis -antiplatelet therapy: Low-dose ASA. 3. Pain Management:  4. Mood:We will monitor and involve neuropsychology as needed -antipsychotic agents:None 5. Neuropsych: This patientiscapable of making decisions on hisown behalf. 6. Skin/Wound Care:routine pressure relief measures. 7. Fluids/Electrolytes/Nutrition:Monitor I/O. Check lytes in am.  8.Systolic CHF/AKI: Heart healthy diet monitor weights daily. Managed with losartan, Lasix,digoxin,Coreg and Lipitor.Monitor renal status with serial checks.  9.Severe CAD: Aggressive medical therapy recommended at this time. On Coreg, losartan, ASA and statin. Off Plavix due to needingeliquis for Afib 10.A. fib with RVR: Monitor heart rate twice daily. Continue digoxin,Coreg and amiodarone.On Eliquis Vitals:   11/09/18 1952 11/10/18 0537  BP: 105/72 107/68  Pulse: 98 77  Resp: 18 20  Temp: 100 F (37.8 C) 97.7 F (36.5 C)  SpO2: 96% 95%  dig level .7, mildly low, HR is lower this am , monitor for now 11.Right thigh hematoma with femoral plexopathy: Bledsoe brace for support. Monitor with for recovery 12.Upper GI bleed: Monitor H&H with serial checks. Continue Protonix twice daily 13.ABLA/iron deficiency anemia/folic acid deficiency:Low iron with elevated ferritin-- Add folic acid. Question IV iron for supplementation.  Hgb stable 4/12 but may be contributing to tachy 14. Hyponatremia: Recheck lytes in am.  15.  Hx of recent ileus, pt with good bowel sounds, may add mylicon  LOS: 5 days A FACE TO FACE EVALUATION WAS  PERFORMED  Erick Colace 11/10/2018, 6:55 AM

## 2018-11-10 NOTE — Plan of Care (Signed)
  Problem: Consults Goal: RH GENERAL PATIENT EDUCATION Description See Patient Education module for education specifics. Outcome: Progressing Goal: Skin Care Protocol Initiated - if Braden Score 18 or less Description If consults are not indicated, leave blank or document N/A Outcome: Progressing   Problem: RH BOWEL ELIMINATION Goal: RH STG MANAGE BOWEL WITH ASSISTANCE Description STG Manage Bowel with mod Assistance.  Outcome: Progressing   Problem: RH SKIN INTEGRITY Goal: RH STG SKIN FREE OF INFECTION/BREAKDOWN Description Patients skin will remain free from further infection or breakdown with mod assist.  Outcome: Progressing Goal: RH STG MAINTAIN SKIN INTEGRITY WITH ASSISTANCE Description STG Maintain Skin Integrity With mod Assistance.  Outcome: Progressing Goal: RH STG ABLE TO PERFORM INCISION/WOUND CARE W/ASSISTANCE Description STG Able To Perform Incision/Wound Care With mod Assistance.  Outcome: Progressing   

## 2018-11-10 NOTE — Progress Notes (Signed)
Slept in brief intervals throughout the night. Resting soundly early this am. Incontinent of stool x2, continent of urine. No complaints voiced. Prn trazodone given for sleep, briefly effective.

## 2018-11-11 ENCOUNTER — Inpatient Hospital Stay (HOSPITAL_COMMUNITY): Payer: Self-pay | Admitting: Occupational Therapy

## 2018-11-11 ENCOUNTER — Inpatient Hospital Stay (HOSPITAL_COMMUNITY): Payer: Self-pay | Admitting: Physical Therapy

## 2018-11-11 ENCOUNTER — Inpatient Hospital Stay (HOSPITAL_COMMUNITY): Payer: Self-pay | Admitting: *Deleted

## 2018-11-11 LAB — CBC
HCT: 23.5 % — ABNORMAL LOW (ref 39.0–52.0)
Hemoglobin: 7 g/dL — ABNORMAL LOW (ref 13.0–17.0)
MCH: 27.7 pg (ref 26.0–34.0)
MCHC: 29.8 g/dL — ABNORMAL LOW (ref 30.0–36.0)
MCV: 92.9 fL (ref 80.0–100.0)
Platelets: 334 10*3/uL (ref 150–400)
RBC: 2.53 MIL/uL — ABNORMAL LOW (ref 4.22–5.81)
RDW: 17.4 % — ABNORMAL HIGH (ref 11.5–15.5)
WBC: 8.7 10*3/uL (ref 4.0–10.5)
nRBC: 0 % (ref 0.0–0.2)

## 2018-11-11 LAB — BASIC METABOLIC PANEL
Anion gap: 8 (ref 5–15)
BUN: 20 mg/dL (ref 6–20)
CO2: 32 mmol/L (ref 22–32)
Calcium: 8.1 mg/dL — ABNORMAL LOW (ref 8.9–10.3)
Chloride: 98 mmol/L (ref 98–111)
Creatinine, Ser: 0.83 mg/dL (ref 0.61–1.24)
GFR calc Af Amer: >60 mL/min (ref >60)
GFR calc non Af Amer: >60 mL/min (ref >60)
Glucose, Bld: 105 mg/dL — ABNORMAL HIGH (ref 70–99)
Potassium: 4 mmol/L (ref 3.5–5.1)
Sodium: 138 mmol/L (ref 135–145)

## 2018-11-11 NOTE — Progress Notes (Signed)
Occupational Therapy Session Note  Patient Details  Name: Aaron [name] MRN: [id number] Date of Birth: Jun 04, 1966  Today's Date: 11/11/2018 OT Individual Time: 1100-1138 and 1445-1555 OT Individual Time Calculation (min): 38 min and 70 min    Short Term Goals: Week 1:  OT Short Term Goal 1 (Week 1): Pt will complete BSC transfer with 2 assist  OT Short Term Goal 2 (Week 1): Pt will complete LB dressing sit<stand with 2 assist using LRAD to work on standing tolerance  OT Short Term Goal 3 (Week 1): Pt will complete 1/3 components of donning pants using AE as needed   Skilled Therapeutic Interventions/Progress Updates:    Session 1: Upon entering the room, pt supine in bed with focus on upright position while in vital go lift bed. Pt given 4 minutes in varied degrees to adjust prior to checking BP. Pt's bed tilted to 23 degrees with BP of 98/65 and asymptomatic and then increased to 35 degrees with BP 93/64 and continuing to be asymptomatic. Pt requesting to " just stand all the way up". Pt is symptomatic with this but BP increasing to 103/67 and HR increasing to 117 bpm. Pt able to perform push into standing with L knee slightly bent and hold for several seconds x 5 reps. Bledsoe brace remained locked for safety with standing tasks. Pt placed into chair position and reports feeling well. Call bell and all needed items within reach upon exiting the room.   Session 2: Upon entering the room, pt supine in bed with no c/o pain this session. Pt verbalizing possible need for BM. OT provided total A to don R bledsoe brace. Pt rolling L <> R with max A to place maxi move sling and transferred onto bariatric drop arm commode chair. Pt unable to void once seated on drop arm commode chair. Pt was able to maintain balance with close supervision and pillow placed on back for comfort. OT provided total A for hygiene and pt transferred back to bed in same manner as above. Pt rolling several times with max A L <> R  to don brief. Pt repositioned in bed for comfort. Call bell and all needed items within reach upon exiting the room.   Therapy Documentation Precautions:  Precautions Precautions: Fall Precaution Comments: Watch BP; VitalGo tilt bed Required Braces or Orthoses: Other Brace Other Brace: RLE Bledsoe brace locked in extension when up- okay to unlock at EOB  Restrictions Weight Bearing Restrictions: No RLE Weight Bearing: Weight bearing as tolerated General:   Vital Signs: Therapy Vitals Temp: 100.3 F (37.9 C) Temp Source: Oral Pulse Rate: 92 Resp: 20 BP: 98/69 Patient Position (if appropriate): Lying Oxygen Therapy SpO2: 98 % O2 Device: Nasal Cannula O2 Flow Rate (L/min): 2 L/min ADL: ADL Grooming: Setup Where Assessed-Grooming: Edge of bed Upper Body Bathing: Supervision/safety Where Assessed-Upper Body Bathing: Edge of bed Lower Body Bathing: Other (comment)(2 assist, bedlevel) Where Assessed-Lower Body Bathing: Bed level Upper Body Dressing: Minimal assistance(hospital gown) Where Assessed-Upper Body Dressing: Edge of bed Lower Body Dressing: Maximal assistance Where Assessed-Lower Body Dressing: Edge of bed Toileting: Not assessed Toilet Transfer: Not assessed Film/video editor: Not assessed ADL Comments: Standing balance not assessed due to pain, fatigue, and pt refusal to attempt    Therapy/Group: Individual Therapy  Alen Bleacher 11/11/2018, 3:59 PM

## 2018-11-11 NOTE — Progress Notes (Signed)
[place] PHYSICAL MEDICINE & REHABILITATION PROGRESS NOTE   Subjective/Complaints:  No issues overnite , moving bowel regularly, still with poor appetite but drinks ensure  ROS- neg CP, SOB, N/V/D   Objective:   No results found. Recent Labs    11/08/18 0904 11/11/18 0459  WBC 7.6 8.7  HGB 7.1* 7.0*  HCT 23.5* 23.5*  PLT 306 334   Recent Labs    11/11/18 0459  NA 138  K 4.0  CL 98  CO2 32  GLUCOSE 105*  BUN 20  CREATININE 0.83  CALCIUM 8.1*    Intake/Output Summary (Last 24 hours) at 11/11/2018 0732 Last data filed at 11/11/2018 0604 Gross per 24 hour  Intake 500 ml  Output 1100 ml  Net -600 ml     Physical Exam: Vital Signs Blood pressure 124/89, pulse 75, temperature 98 F (36.7 C), temperature source Oral, resp. rate 19, weight 131.8 kg, SpO2 99 %.   General: No acute distress Mood and affect are appropriate Heart: Regular rate and rhythm no rubs murmurs or extra sounds Lungs: Clear to auscultation, breathing unlabored, no rales or wheezes Abdomen: Positive bowel sounds, soft nontender to palpation, nondistended Extremities: No clubbing, cyanosis, or edema Skin: No evidence of breakdown, no evidence of rash Neurologic: Cranial nerves II through XII intact, motor strength is 5/5 in bilateral deltoid, bicep, tricep, grip,2- right and 3- left  hip flexor, knee extensors, ankle dorsiflexor and plantar flexor Sensory exam normal sensation to light touch and proprioception in bilateral upper and lower extremities Cerebellar exam normal finger to nose to finger as well as heel to shin in bilateral upper and lower extremities Musculoskeletal: Full range of motion in all 4 extremities. No joint swelling   Assessment/Plan: 1. Functional deficits secondary to severe debility CHF which require 3+ hours per day of interdisciplinary therapy in a comprehensive inpatient rehab setting.  Physiatrist is providing close team supervision and 24 hour management of  active medical problems listed below.  Physiatrist and rehab team continue to assess barriers to discharge/monitor patient progress toward functional and medical goals  Care Tool:  Bathing    Body parts bathed by patient: Right arm, Left arm, Chest, Abdomen, Front perineal area, Face   Body parts bathed by helper: Buttocks, Right lower leg, Left lower leg     Bathing assist Assist Level: 2 Helpers     Upper Body Dressing/Undressing Upper body dressing   What is the patient wearing?: Hospital gown only    Upper body assist Assist Level: Minimal Assistance - Patient > 75%    Lower Body Dressing/Undressing Lower body dressing      What is the patient wearing?: Incontinence brief     Lower body assist Assist for lower body dressing: 2 Helpers     Toileting Toileting Toileting Activity did not occur (Clothing management and hygiene only): N/A (no void or bm)  Toileting assist Assist for toileting: 2 Helpers     Transfers Chair/bed transfer  Transfers assist     Chair/bed transfer assist level: Dependent - mechanical lift     Locomotion Ambulation   Ambulation assist   Ambulation activity did not occur: Safety/medical concerns          Walk 10 feet activity   Assist  Walk 10 feet activity did not occur: Safety/medical concerns        Walk 50 feet activity   Assist Walk 50 feet with 2 turns activity did not occur: Safety/medical concerns  Walk 150 feet activity   Assist Walk 150 feet activity did not occur: Safety/medical concerns         Walk 10 feet on uneven surface  activity   Assist Walk 10 feet on uneven surfaces activity did not occur: Safety/medical concerns         Wheelchair     Assist Will patient use wheelchair at discharge?: Yes      Wheelchair assist level: Minimal Assistance - Patient > 75% Max wheelchair distance: 75    Wheelchair 50 feet with 2 turns activity    Assist        Assist  Level: Minimal Assistance - Patient > 75%   Wheelchair 150 feet activity     Assist Wheelchair 150 feet activity did not occur: Safety/medical concerns        Medical Problem List and Plan: 1.Deconditioningsecondary to congestive heart failure, ischemic cardiomyopathy CIR PT, OT Team conference today please see physician documentation under team conference tab, met with team face-to-face to discuss problems,progress, and goals. Formulized individual treatment plan based on medical history, underlying problem and comorbidities..Have written cardiac precaution based on EF 15% 2. Antithrombotics: -DVT/anticoagulation:Pharmaceutical:Other (comment)--Eliquis -antiplatelet therapy: Low-dose ASA. 3. Pain Management:  4. Mood:We will monitor and involve neuropsychology as needed -antipsychotic agents:None 5. Neuropsych: This patientiscapable of making decisions on hisown behalf. 6. Skin/Wound Care:routine pressure relief measures. 7. Fluids/Electrolytes/Nutrition:Monitor I/O. Check lytes in am.  8.Systolic CHF/AKI: Heart healthy diet monitor weights daily. Managed with losartan, Lasix,digoxin,Coreg and Lipitor.Monitor renal status with serial checks.  9.Severe CAD: Aggressive medical therapy recommended at this time. On Coreg, losartan, ASA and statin. Off Plavix due to needingeliquis for Afib 10.A. fib with RVR: Monitor heart rate twice daily. Continue digoxin,Coreg and amiodarone.On Eliquis Vitals:   11/10/18 1918 11/11/18 0500  BP: 112/69 124/89  Pulse: 98 75  Resp: 18 19  Temp: 97.9 F (36.6 C) 98 F (36.7 C)  SpO2: 98% 99%  , monitor for now, HR now <100bpm 11.Right thigh hematoma with femoral plexopathy: Bledsoe brace for support. Monitor with for recovery 12.Upper GI bleed: Monitor H&H with serial checks. Continue Protonix twice daily 13.ABLA/iron deficiency anemia/folic acid deficiency:Low iron with elevated  ferritin-- Add folic acid. Question IV iron for supplementation.  Hgb stable 4/12 but may be contributing to tachy 14. Hyponatremia: Recheck lytes in am.  15.  Hx of recent ileus,now with BMs  LOS: 6 days A FACE TO FACE EVALUATION WAS PERFORMED  Charlett Blake 11/11/2018, 7:32 AM

## 2018-11-11 NOTE — Patient Care Conference (Signed)
Inpatient RehabilitationTeam Conference and Plan of Care Update Date: 11/11/2018   Time: 10:50 AM    Patient Name: Aaron [name]      Medical Record Number: [id number]  Date of Birth: 11/07/1965 Sex: Male         Room/Bed: 4W03C/4W03C-01 Payor Info: Payor: MEDICAID PENDING / Plan: MEDICAID PENDING / Product Type: *No Product type* /    Admitting Diagnosis: debility  Admit Date/Time:  11/05/2018  5:05 PM Admission Comments: No comment available   Primary Diagnosis:  Debility Principal Problem: Debility  Patient Active Problem List   Diagnosis Date Noted  . Debility 11/05/2018  . Pressure injury of skin 11/04/2018  . Iron deficiency anemia   . Orthostatic hypotension   . Hematoma of right thigh 10/30/2018  . Acute blood loss anemia 10/30/2018  . Tobacco abuse 10/30/2018  . Gastritis with bleeding 10/30/2018  . Esophagitis, [place] grade C 10/30/2018  . Protein-calorie malnutrition, severe 10/26/2018  . Hypokalemia   . Atrial fibrillation with rapid ventricular response (HCC) 10/12/2018  . Morbid obesity (HCC) 10/09/2018  . Acute systolic CHF (congestive heart failure) (HCC) 10/02/2018  . Acute hypoxemic respiratory failure (HCC) 10/02/2018  . Anasarca 10/01/2018  . AKI (acute kidney injury) (HCC) 10/01/2018    Expected Discharge Date: Expected Discharge Date: 12/02/18  Team Members Present: Physician leading conference: Dr. Claudette Laws Social Worker Present: Dossie Der, LCSW Nurse Present: Briscoe Deutscher, LPN PT Present: Grier Rocher, PT;Rosita Dechalus, PTA OT Present: Jackquline Denmark, OT SLP Present: Reuel Derby, SLP PPS Coordinator present : Fae Pippin     Current Status/Progress Goal Weekly Team Focus  Medical   needs freq rest breaks  improve endurance adn strenght  improve bowel continence, work on Orhtostasis   Bowel/Bladder   Pt is continent of bladder and incontinent of bowel. LBM 11/10/2018.  Encourage timed toileting. Maintian regular bowel  pattern.  Assist with toileting needs PRN.   Swallow/Nutrition/ Hydration             ADL's   max - total A +2 for LB self care tasks, mod A UB self care, maxi move transfer into wheelchair  min A overall with mod A for sit <>stand  functional transfers, self care, endurance, strengthening   Mobility   mod assist rolling in bed. max assist sit<>supine. maxi SKy dependent transfers to [facility]. min assist WC mobility   min-mod assist for functional transfers. supervision assist WC mobility. min-mod assist ambulation with LRAD    improved upright/activity tolerance, safety with transfers. WC mobility.    Communication             Safety/Cognition/ Behavioral Observations            Pain   No complaints of pain.  Remain pain free.  Assess pain Q shift and PRN.   Skin   Pt has abrasion to R leg, ecchymosis to R arm, MASD to groin/peri area bilat, and a Stage II pressure ulce to the R nare that is OTA.  Treat skin issues per orders to promote healing.  Assess skin Q shift and PRN.      *See Care Plan and progress notes for long and short-term goals.     Barriers to Discharge  Current Status/Progress Possible Resolutions Date Resolved   Physician    Medical stability     slow progress  cont rehab , abd binder      Nursing  PT                    OT                  SLP                SW                Discharge Planning/Teaching Needs:  Home with wife and kids to assist him. Wife assures worker pt will have 24 hr care at home. Wife is a EMT along with pt      Team Discussion:  Goals min-mod level currently max to total assist with maximove. MD reports BP and HR ok and stable. Needs many rest breaks in between therapies. Ileus resolving. Did stand in therapies today felt good according to pt. See neuro-psych next week.  Revisions to Treatment Plan:  DC 5/6    Continued Need for Acute Rehabilitation Level of Care: The patient requires daily medical management by a  physician with specialized training in physical medicine and rehabilitation for the following conditions: Daily direction of a multidisciplinary physical rehabilitation program to ensure safe treatment while eliciting the highest outcome that is of practical value to the patient.: Yes Daily medical management of patient stability for increased activity during participation in an intensive rehabilitation regime.: Yes Daily analysis of laboratory values and/or radiology reports with any subsequent need for medication adjustment of medical intervention for : Cardiac problems;Other   I attest that I was present, lead the team conference, and concur with the assessment and plan of the team. Teleconference held due to COVID 19   Stratton Villwock, Lemar Livings 11/12/2018, 9:13 AM

## 2018-11-11 NOTE — Progress Notes (Signed)
Physical Therapy Session Note  Patient Details  Name: Aaron [name] MRN: [id number] Date of Birth: 1966-02-25  Today's Date: 11/11/2018 PT Individual Time: 0802-0915 PT Individual Time Calculation (min): 73 min   Short Term Goals: Week 1:  PT Short Term Goal 1 (Week 1): Pt will perform bed mobliity with mod assist  PT Short Term Goal 2 (Week 1): Pt will propell WC with min assist x 181f.  PT Short Term Goal 3 (Week 1): Pt will perform bed<>WC transfer with max assist +2  PT Short Term Goal 4 (Week 1): Pt will ambulate 142fwith mod assist   Skilled Therapeutic Interventions/Progress Updates:  Pt received supine in bed and agreeable to PT. Supine>sit transfer with max assist and moderate cues for technique   Orthostatic VS assessed by PT. Supine: 105/67. Sitting 0 min 95/61, sitting 5 min 86/50 mild s/s. Sitting 8 minutes 85/55 mild-mod s/s. Sit>supine with max assist from PT for BLE control.   PT adjusted and applied L Bldesoe brace. Tilt table feature on vital go bed. With VS assessment. Supine: 101/65, no s/s. 30deg 92/60, no s/s. Increase to 50 degrees and noted LLE starting to buckle through bledsoe brace and LE strap. Returned to supine. Bledsoe brace adjusted and additional LE strap applied. Supine BP 91/72. 50 deg 79/53, significant orthostatic s/s unable to remain in 50deg due to increased s/s.   Throughout treatment pt noted to have productive cough. SpO2 98% om 1L/min O2.  Pt left in supine with all needs met and LLE brace removed by PT.        Therapy Documentation Precautions:  Precautions Precautions: Fall Precaution Comments: Watch BP; VitalGo tilt bed Required Braces or Orthoses: Other Brace Other Brace: RLE Bledsoe brace locked in extension when up- okay to unlock at EOB  Restrictions Weight Bearing Restrictions: No RLE Weight Bearing: Weight bearing as tolerated Vital Signs: Therapy Vitals Pulse Rate: 75 BP: 128/84 Pain: deneis   Therapy/Group:  Individual Therapy  AuLorie Phenix/15/2020, 9:20 AM

## 2018-11-11 NOTE — Progress Notes (Signed)
Physical Therapy Session Note  Patient Details  Name: Aaron [name] MRN: [id number] Date of Birth: May 26, 1966  Today's Date: 11/11/2018 PT Individual Time: 1335-1430 PT Individual Time Calculation (min): 55 min   Short Term Goals: Week 1:  PT Short Term Goal 1 (Week 1): Pt will perform bed mobliity with mod assist  PT Short Term Goal 2 (Week 1): Pt will propell WC with min assist x 120f.  PT Short Term Goal 3 (Week 1): Pt will perform bed<>WC transfer with max assist +2  PT Short Term Goal 4 (Week 1): Pt will ambulate 115fwith mod assist   Skilled Therapeutic Interventions/Progress Updates: Pt presented in bed agreeable to therapy. BP checked in supine 91/46. Performed supine to sit at EOB modA with use of bed features modA. Pt indicating no s/s of orthostasis upon sitting with BP 81/60. Pt able to maintain sitting at EOB x 10 min with fatigue but no c/o dizziness. BP checked after 10 min 88/52. Pt performed ankle pumps and LAQ AROM RLE, AAROM LLE to fatigue 5-10 reps while sitting at EOB. Pt returned to supine modA. While sitting EOB noted pt's sheet soiled. Pt performed rolling L/R min/modA to change sheets and apply pads. Pt with persistent cough while sitting that continued  Initially when returned to bed requiring frequent rest breaks during activities. PTA also discussed nutrition with pt as pt indicated not eating much, discussed importance of increased nutrition for improving energy and tolerance to therapy. Pt remained in bed at end of session and left with call bell within reach and needs met.      Therapy Documentation Precautions:  Precautions Precautions: Fall Precaution Comments: Watch BP; VitalGo tilt bed Required Braces or Orthoses: Other Brace Other Brace: RLE Bledsoe brace locked in extension when up- okay to unlock at EOB  Restrictions Weight Bearing Restrictions: No RLE Weight Bearing: Weight bearing as tolerated General:   Vital Signs: Therapy Vitals Temp: 100.3  F (37.9 C) Temp Source: Oral Pulse Rate: 92 Resp: 20 BP: 98/69 Patient Position (if appropriate): Lying Oxygen Therapy SpO2: 98 % O2 Device: Nasal Cannula O2 Flow Rate (L/min): 2 L/min   Therapy/Group: Individual Therapy  Aaron [name]  Aaron [name], PTA  11/11/2018, 4:17 PM

## 2018-11-12 ENCOUNTER — Inpatient Hospital Stay (HOSPITAL_COMMUNITY): Payer: Medicaid Other

## 2018-11-12 ENCOUNTER — Encounter (HOSPITAL_COMMUNITY): Payer: Self-pay | Admitting: Diagnostic Radiology

## 2018-11-12 ENCOUNTER — Inpatient Hospital Stay (HOSPITAL_COMMUNITY): Payer: Self-pay | Admitting: Occupational Therapy

## 2018-11-12 ENCOUNTER — Inpatient Hospital Stay (HOSPITAL_COMMUNITY)
Admission: AD | Admit: 2018-11-12 | Discharge: 2018-11-25 | DRG: 177 | Disposition: A | Discharge status: Rehabilitation Facility | Payer: Medicaid Other | Source: Intra-hospital | Attending: Internal Medicine | Admitting: Internal Medicine

## 2018-11-12 ENCOUNTER — Inpatient Hospital Stay (HOSPITAL_COMMUNITY): Payer: Self-pay | Admitting: Physical Therapy

## 2018-11-12 DIAGNOSIS — J984 Other disorders of lung: Secondary | ICD-10-CM | POA: Diagnosis not present

## 2018-11-12 DIAGNOSIS — D638 Anemia in other chronic diseases classified elsewhere: Secondary | ICD-10-CM | POA: Diagnosis present

## 2018-11-12 DIAGNOSIS — Z8719 Personal history of other diseases of the digestive system: Secondary | ICD-10-CM

## 2018-11-12 DIAGNOSIS — E43 Unspecified severe protein-calorie malnutrition: Secondary | ICD-10-CM | POA: Diagnosis not present

## 2018-11-12 DIAGNOSIS — J9621 Acute and chronic respiratory failure with hypoxia: Secondary | ICD-10-CM | POA: Diagnosis present

## 2018-11-12 DIAGNOSIS — B9620 Unspecified Escherichia coli [E. coli] as the cause of diseases classified elsewhere: Secondary | ICD-10-CM | POA: Diagnosis not present

## 2018-11-12 DIAGNOSIS — E876 Hypokalemia: Secondary | ICD-10-CM | POA: Diagnosis present

## 2018-11-12 DIAGNOSIS — R05 Cough: Secondary | ICD-10-CM | POA: Diagnosis not present

## 2018-11-12 DIAGNOSIS — J9601 Acute respiratory failure with hypoxia: Secondary | ICD-10-CM | POA: Diagnosis present

## 2018-11-12 DIAGNOSIS — L89812 Pressure ulcer of head, stage 2: Secondary | ICD-10-CM | POA: Diagnosis not present

## 2018-11-12 DIAGNOSIS — I4892 Unspecified atrial flutter: Secondary | ICD-10-CM | POA: Diagnosis not present

## 2018-11-12 DIAGNOSIS — J869 Pyothorax without fistula: Secondary | ICD-10-CM | POA: Diagnosis not present

## 2018-11-12 DIAGNOSIS — I255 Ischemic cardiomyopathy: Secondary | ICD-10-CM | POA: Diagnosis present

## 2018-11-12 DIAGNOSIS — I454 Nonspecific intraventricular block: Secondary | ICD-10-CM | POA: Diagnosis present

## 2018-11-12 DIAGNOSIS — D62 Acute posthemorrhagic anemia: Secondary | ICD-10-CM | POA: Diagnosis present

## 2018-11-12 DIAGNOSIS — R5381 Other malaise: Secondary | ICD-10-CM | POA: Diagnosis present

## 2018-11-12 DIAGNOSIS — J90 Pleural effusion, not elsewhere classified: Secondary | ICD-10-CM | POA: Diagnosis not present

## 2018-11-12 DIAGNOSIS — K2971 Gastritis, unspecified, with bleeding: Secondary | ICD-10-CM | POA: Diagnosis not present

## 2018-11-12 DIAGNOSIS — L89312 Pressure ulcer of right buttock, stage 2: Secondary | ICD-10-CM | POA: Diagnosis not present

## 2018-11-12 DIAGNOSIS — M7981 Nontraumatic hematoma of soft tissue: Secondary | ICD-10-CM | POA: Diagnosis not present

## 2018-11-12 DIAGNOSIS — I5022 Chronic systolic (congestive) heart failure: Secondary | ICD-10-CM | POA: Diagnosis present

## 2018-11-12 DIAGNOSIS — R0602 Shortness of breath: Secondary | ICD-10-CM | POA: Diagnosis not present

## 2018-11-12 DIAGNOSIS — I2510 Atherosclerotic heart disease of native coronary artery without angina pectoris: Secondary | ICD-10-CM

## 2018-11-12 DIAGNOSIS — I340 Nonrheumatic mitral (valve) insufficiency: Secondary | ICD-10-CM | POA: Diagnosis present

## 2018-11-12 DIAGNOSIS — R918 Other nonspecific abnormal finding of lung field: Secondary | ICD-10-CM | POA: Diagnosis not present

## 2018-11-12 DIAGNOSIS — R945 Abnormal results of liver function studies: Secondary | ICD-10-CM | POA: Diagnosis present

## 2018-11-12 DIAGNOSIS — S7011XS Contusion of right thigh, sequela: Secondary | ICD-10-CM

## 2018-11-12 DIAGNOSIS — I480 Paroxysmal atrial fibrillation: Secondary | ICD-10-CM

## 2018-11-12 DIAGNOSIS — Z833 Family history of diabetes mellitus: Secondary | ICD-10-CM

## 2018-11-12 DIAGNOSIS — Z7901 Long term (current) use of anticoagulants: Secondary | ICD-10-CM

## 2018-11-12 DIAGNOSIS — J9811 Atelectasis: Secondary | ICD-10-CM | POA: Diagnosis not present

## 2018-11-12 DIAGNOSIS — I951 Orthostatic hypotension: Secondary | ICD-10-CM | POA: Diagnosis present

## 2018-11-12 DIAGNOSIS — J9383 Other pneumothorax: Secondary | ICD-10-CM

## 2018-11-12 DIAGNOSIS — S7011XA Contusion of right thigh, initial encounter: Secondary | ICD-10-CM | POA: Diagnosis present

## 2018-11-12 DIAGNOSIS — Z6835 Body mass index (BMI) 35.0-35.9, adult: Secondary | ICD-10-CM

## 2018-11-12 DIAGNOSIS — Z79899 Other long term (current) drug therapy: Secondary | ICD-10-CM

## 2018-11-12 DIAGNOSIS — Z8701 Personal history of pneumonia (recurrent): Secondary | ICD-10-CM

## 2018-11-12 DIAGNOSIS — N1330 Unspecified hydronephrosis: Secondary | ICD-10-CM | POA: Diagnosis not present

## 2018-11-12 DIAGNOSIS — R001 Bradycardia, unspecified: Secondary | ICD-10-CM | POA: Diagnosis present

## 2018-11-12 DIAGNOSIS — J939 Pneumothorax, unspecified: Secondary | ICD-10-CM

## 2018-11-12 DIAGNOSIS — K209 Esophagitis, unspecified: Secondary | ICD-10-CM | POA: Diagnosis present

## 2018-11-12 DIAGNOSIS — Z87891 Personal history of nicotine dependence: Secondary | ICD-10-CM

## 2018-11-12 DIAGNOSIS — I5021 Acute systolic (congestive) heart failure: Secondary | ICD-10-CM | POA: Diagnosis present

## 2018-11-12 DIAGNOSIS — J9382 Other air leak: Secondary | ICD-10-CM

## 2018-11-12 DIAGNOSIS — I4891 Unspecified atrial fibrillation: Secondary | ICD-10-CM | POA: Diagnosis present

## 2018-11-12 DIAGNOSIS — Z9689 Presence of other specified functional implants: Secondary | ICD-10-CM

## 2018-11-12 HISTORY — DX: Acute systolic (congestive) heart failure: I50.21

## 2018-11-12 HISTORY — DX: Unspecified atrial fibrillation: I48.91

## 2018-11-12 HISTORY — PX: IR PERC PLEURAL DRAIN W/INDWELL CATH W/IMG GUIDE: IMG5383

## 2018-11-12 HISTORY — DX: Atherosclerotic heart disease of native coronary artery without angina pectoris: I25.10

## 2018-11-12 LAB — PROTIME-INR
INR: 1.2 (ref 0.8–1.2)
Prothrombin Time: 15.1 seconds (ref 11.4–15.2)

## 2018-11-12 LAB — CBC
HCT: 23.9 % — ABNORMAL LOW (ref 39.0–52.0)
Hemoglobin: 7.2 g/dL — ABNORMAL LOW (ref 13.0–17.0)
MCH: 28 pg (ref 26.0–34.0)
MCHC: 30.1 g/dL (ref 30.0–36.0)
MCV: 93 fL (ref 80.0–100.0)
Platelets: 341 10*3/uL (ref 150–400)
RBC: 2.57 MIL/uL — ABNORMAL LOW (ref 4.22–5.81)
RDW: 17.4 % — ABNORMAL HIGH (ref 11.5–15.5)
WBC: 8.8 10*3/uL (ref 4.0–10.5)
nRBC: 0 % (ref 0.0–0.2)

## 2018-11-12 MED ORDER — LIDOCAINE HCL 1 % IJ SOLN
INTRAMUSCULAR | Status: AC
  Filled 2018-11-12: qty 20

## 2018-11-12 MED ORDER — AMIODARONE HCL 200 MG PO TABS
200.0000 mg | ORAL_TABLET | Freq: Two times a day (BID) | ORAL | Status: DC
  Administered 2018-11-12 – 2018-11-17 (×10): 200 mg via ORAL
  Filled 2018-11-12 (×10): qty 1

## 2018-11-12 MED ORDER — LIDOCAINE HCL 1 % IJ SOLN
INTRAMUSCULAR | Status: DC | PRN
  Administered 2018-11-12: 15 mL

## 2018-11-12 MED ORDER — VANCOMYCIN HCL 10 G IV SOLR
2000.0000 mg | Freq: Once | INTRAVENOUS | Status: DC
  Filled 2018-11-12: qty 2000

## 2018-11-12 MED ORDER — ACETAMINOPHEN 325 MG PO TABS
650.0000 mg | ORAL_TABLET | ORAL | Status: DC | PRN
  Administered 2018-11-13: 650 mg via ORAL
  Filled 2018-11-12 (×2): qty 2

## 2018-11-12 MED ORDER — PANTOPRAZOLE SODIUM 40 MG PO TBEC
40.0000 mg | DELAYED_RELEASE_TABLET | Freq: Two times a day (BID) | ORAL | Status: DC
  Administered 2018-11-12 – 2018-11-25 (×26): 40 mg via ORAL
  Filled 2018-11-12 (×26): qty 1

## 2018-11-12 MED ORDER — POLYETHYLENE GLYCOL 3350 17 G PO PACK
17.0000 g | PACK | Freq: Two times a day (BID) | ORAL | Status: DC
  Administered 2018-11-12 – 2018-11-25 (×10): 17 g via ORAL
  Filled 2018-11-12 (×16): qty 1

## 2018-11-12 MED ORDER — FENTANYL CITRATE (PF) 100 MCG/2ML IJ SOLN
INTRAMUSCULAR | Status: AC | PRN
  Administered 2018-11-12: 25 ug via INTRAVENOUS

## 2018-11-12 MED ORDER — FENTANYL CITRATE (PF) 100 MCG/2ML IJ SOLN
INTRAMUSCULAR | Status: AC
  Filled 2018-11-12: qty 2

## 2018-11-12 MED ORDER — FUROSEMIDE 40 MG PO TABS
40.0000 mg | ORAL_TABLET | Freq: Every day | ORAL | Status: DC
  Administered 2018-11-13 – 2018-11-25 (×13): 40 mg via ORAL
  Filled 2018-11-12 (×13): qty 1

## 2018-11-12 MED ORDER — VANCOMYCIN HCL 10 G IV SOLR
1750.0000 mg | Freq: Two times a day (BID) | INTRAVENOUS | Status: DC
  Administered 2018-11-13 – 2018-11-15 (×5): 1750 mg via INTRAVENOUS
  Filled 2018-11-12 (×7): qty 1750

## 2018-11-12 MED ORDER — DIGOXIN 125 MCG PO TABS
0.1250 mg | ORAL_TABLET | Freq: Every day | ORAL | Status: DC
  Administered 2018-11-13 – 2018-11-20 (×8): 0.125 mg via ORAL
  Filled 2018-11-12 (×8): qty 1

## 2018-11-12 MED ORDER — ROSUVASTATIN CALCIUM 20 MG PO TABS
20.0000 mg | ORAL_TABLET | Freq: Every day | ORAL | Status: DC
  Administered 2018-11-12 – 2018-11-24 (×13): 20 mg via ORAL
  Filled 2018-11-12 (×13): qty 1

## 2018-11-12 MED ORDER — PIPERACILLIN-TAZOBACTAM 3.375 G IVPB
3.3750 g | Freq: Three times a day (TID) | INTRAVENOUS | Status: DC
  Administered 2018-11-12 – 2018-11-15 (×10): 3.375 g via INTRAVENOUS
  Filled 2018-11-12 (×12): qty 50

## 2018-11-12 MED ORDER — ALUM & MAG HYDROXIDE-SIMETH 200-200-20 MG/5ML PO SUSP: 30.0000 mL | ORAL | Status: DC | PRN

## 2018-11-12 MED ORDER — CARVEDILOL 3.125 MG PO TABS
3.1250 mg | ORAL_TABLET | Freq: Two times a day (BID) | ORAL | Status: DC
  Administered 2018-11-12 – 2018-11-22 (×19): 3.125 mg via ORAL
  Filled 2018-11-12 (×21): qty 1

## 2018-11-12 MED ORDER — VANCOMYCIN HCL 10 G IV SOLR
2500.0000 mg | Freq: Once | INTRAVENOUS | Status: AC
  Administered 2018-11-12: 2500 mg via INTRAVENOUS
  Filled 2018-11-12: qty 2500

## 2018-11-12 NOTE — Discharge Summary (Signed)
Physician Discharge Summary  [name] MRN: [id number] DOB/AGE: 1965/10/29 53 y.o.  Admit date: 11/05/2018 Discharge date: 11/12/2018  Discharge Diagnoses:  Principal Problem:   SOB (shortness of breath) Active Problems:   Acute systolic CHF (congestive heart failure) (HCC)   Atrial fibrillation with rapid ventricular response (HCC)   Gastritis with bleeding   Debility   Discharged Condition: stable   Significant Diagnostic Studies: Dg Chest 2 View  Addendum Date: 11/12/2018   ADDENDUM REPORT: 11/12/2018 10:01 ADDENDUM: Critical Value/emergent results were called by telephone at the time of interpretation on 11/12/2018 at 9:56 a.m. to Dr. Claudette Laws , who verbally acknowledged these results. Electronically Signed   By: Elberta Fortis M.D.   On: 11/12/2018 10:01   Result Date: 11/12/2018 CLINICAL DATA:  Recent cough.  CHF. EXAM: CHEST - 2 VIEW COMPARISON:  10/01/2018 FINDINGS: Left-sided PICC line has tip over the SVC at the level of the cavoatrial junction. Left lung is clear. There is near complete opacification of the right lung with air-fluid level over the upper lung/apex compatible with large hydro versus hemothorax. Moderate cardiomegaly unchanged. Remainder of the exam is unchanged. IMPRESSION: Large right hydro versus hemothorax. Left-sided PICC line unchanged. Electronically Signed: By: Elberta Fortis M.D. On: 11/12/2018 09:42    Labs:  Basic Metabolic Panel:  Recent Labs  Lab 11/06/18 0511 11/11/18 0459  NA 137 138  K 3.7 4.0  CL 100 98  CO2 31 32  GLUCOSE 109* 105*  BUN 19 20  CREATININE 0.92 0.83  CALCIUM 8.0* 8.1*    CBC: Recent Labs  Lab 11/06/18 0511 11/08/18 0904 11/11/18 0459 11/12/18 1047  WBC 8.0 7.6 8.7 8.8  NEUTROABS 6.9  --   --   --   HGB 7.4* 7.1* 7.0* 7.2*  HCT 24.5* 23.5* 23.5* 23.9*  MCV 91.1 91.8 92.9 93.0  PLT 304 306 334 341    CBG: No results for input(s): GLUCAP in the last 168 hours.  Brief HPI:   Aaron [name] is a 53 year old male admitted on 10/01/18 with abdominal pain peripheral edema and shortness of breath.  He was noted to have anasarca with abnormal LFTs, AKI and RLL effusion with concerns of pneumonia due to leukocytosis.  Abdominal ultrasound done revealing cholelithiasis with fatty liver and liver Doppler was negative for thrombosis.  2D echo revealed diffuse hypokinesis with ejection fraction of 15%.  Dr. Loney Hering consulted for input and felt that AKI/elevated LFTs likely due to congestion.  He underwent cardiac cath by Dr. Bascom Levels revealing severe three-vessel CAD with global hypokinesis with LVEF 15% and moderate pulmonary hypertension.  Dr. Dorris Fetch did not feel patient to be a CABG candidate and medical treatment recommended.  He did develop A. fib with RVR requiring IV amiodarone for rate control and was started on heparin.  He did develop right thigh pain with quad weakness felt to be due to femoral nerve compression and Dr. August Saucer recommended Bledsoe brace for support when up and that hematoma would resolve with time.[name]   He also developed nausea vomiting due to adynamic ileus that improved with placement of NG tube.  He was noted to have coffee-ground fluid from NG tube and Dr.Karki /GI recommended n.p.o. with IV Protonix with monitoring H/H for further drop. He underwent EGD on 3/22 revealing esophagitis with nonbleeding esophageal ulcers and erosive gastropathy.  Clear liquids as well as supportive care recommended.  He continues to have issues with N/V as well as abdominal distention with  concerns of early SBO.  GI symptoms slowly improved with tolerance of NG tube clamping and eventually removed by 4/3. He was cleared to start anticoagulation and diet slowly advanced to regular.  Acute systolic CHF compensated--aldactone changed to lasix on 4/8 due to upward trend in weights .  H&H stable and he continues to have intermittent issues with anxiety as well as dizziness.  Patient noted to  be severely deconditioned and therapy working on pre-gait measures as well as sitting at the edge of the bed.  CIR recommended due to deconditioning    Hospital Course: Aaron [name] was admitted to rehab 11/05/2018 for inpatient therapies to consist of PT, ST and OT at least three hours five days a week. Past admission physiatrist, therapy team and rehab RN have worked together to provide customized collaborative inpatient rehab. Therapy evaluation revealed that patient required total assist for mobility and for basic self care tasks. He was able to sit at EOB for 20 minutes and propel his wheelchair for 71' with min assist. He required Maxi move for transfers. He had hypoxia with activity requiring supplemental oxygen to keep sats >90%.  He was maintained on apixaban and serial H/H has been stable. Blood pressures have been low with tachycardia felt to be due to deconditioning.  He continued to require tilt table due to ongoing orthostatic issues. He has had some issues with dyspepsia but has been having BM's without signs of ileus. On 4/16 am, patient reported increase in SOB with productive cough. CXR done revealing large right hydro v/s hemothorax. Stat CT chest as well as CBC ordered for work up. H/H was stable but CT chest revealed empyema.  CT surgeon were consulted for input and Dr. Wyonia Hough consulted to transfer patient to acute hospital for management. He was discharged to acute care on 11/12/18     Current Medications: . amiodarone  200 mg Oral BID  . apixaban  5 mg Oral BID  . carvedilol  3.125 mg Oral BID WC  . digoxin  0.125 mg Oral Daily  . folic acid  1 mg Oral Daily  . furosemide  40 mg Oral Daily  . losartan  25 mg Oral Daily  . nicotine  14 mg Transdermal Daily  . pantoprazole  40 mg Oral BID AC  . rosuvastatin  20 mg Oral q1800  . senna-docusate  2 tablet Oral BID    Diet: Heart Healthy.   Disposition: Acute hospital    Signed: Jacquelynn Cree 11/12/2018, 1:22 PM

## 2018-11-12 NOTE — Progress Notes (Signed)
Social Work [name], male   DOB: 1965/12/23, 53 y.o.   MRN: [id number] Met with pt to give him an update on team conference goals min-mod level of assist and target discharge date 5/6. He is pleased with how well he is doing here. Will call wife to update her also. Pt voiced the family will provide 24 hr care for him at discharge.

## 2018-11-12 NOTE — Progress Notes (Signed)
Patient arrived to 4E room 13 at this time. Telemetry applied and CCMD notified. CHG bath complete. V/s and assessment done. Patient oriented to room and how to call nurse with any needs.   Ernestina Columbia, RN

## 2018-11-12 NOTE — Progress Notes (Signed)
Orthopedic Tech Progress Note Patient Details:  Aaron [name] 1966-04-22 [id number] Washed his legs and put on clean shoes once finish Ortho Devices Type of Ortho Device: Radio broadcast assistant Ortho Device/Splint Location: bilateral Ortho Device/Splint Interventions: Adjustment, Application, Ordered   Post Interventions Patient Tolerated: Well Instructions Provided: Care of device, Adjustment of device   Aaron [name] 11/12/2018, 11:34 AM

## 2018-11-12 NOTE — Consult Note (Signed)
Chief Complaint: Patient was seen in consultation today for empyema  Referring Physician(s): Dr. Dorris Fetch  Supervising Physician: Richarda Overlie  Patient Status: [facility] - Out-pt  History of Present Illness: Aaron [name] is a 53 y.o. male with past medical history anasarca, CHF, CAD, and a fib on Eliquis at home.  Patient with multivessel disease treated conservatively.  He was recently noted to have a RLL effusion with concerns for pneumonia.  He has been working with CIR for rehabilitation of severe deconditioning, but was sent for CT Chest due to progressive dyspnea.    CT Chest 4/16 showed: Right-sided empyema, with complete collapse of the right lower lobe, and partial collapse of the right middle lobe and right upper lobe. Reactive mediastinal lymph nodes.   Now admitted back to inpatient care.  TCTS has consulted on the patient and recommends IR percutaneous chest tube placement.   Patient last took Eliquis this AM. He has not been NPO today.   Past Medical History:  Diagnosis Date   Morbid obesity [facility])     Past Surgical History:  Procedure Laterality Date   ESOPHAGOGASTRODUODENOSCOPY (EGD) WITH PROPOFOL N/A 10/18/2018   Procedure: ESOPHAGOGASTRODUODENOSCOPY (EGD) WITH PROPOFOL;  Surgeon: Kerin Salen, MD;  Location: [facility] END[facility];  Service: Gastroenterology;  Laterality: N/A;   RIGHT/LEFT HEART CATH AND CORONARY ANGIOGRAPHY N/A 10/09/2018   Procedure: RIGHT/LEFT HEART CATH AND CORONARY ANGIOGRAPHY;  Surgeon: Iran Ouch, MD;  Location: [facility] INVASIVE CV LAB;  Service: Cardiovascular;  Laterality: N/A;    Allergies: Patient has no known allergies.  Medications: Prior to Admission medications   Medication Sig Start Date End Date Taking? Authorizing Provider  acetaminophen (TYLENOL) 325 MG tablet Take 2 tablets (650 mg total) by mouth every 4 (four) hours as needed for headache or mild pain. 10/30/18   Calvert Cantor, MD  alum & mag hydroxide-simeth  (MAALOX/MYLANTA) 200-200-20 MG/5ML suspension Take 30 mLs by mouth every 4 (four) hours as needed for indigestion or heartburn. 10/30/18   Calvert Cantor, MD  amiodarone (PACERONE) 200 MG tablet Take 1 tablet (200 mg total) by mouth 2 (two) times daily. 10/30/18   Calvert Cantor, MD  apixaban (ELIQUIS) 5 MG TABS tablet Take 1 tablet (5 mg total) by mouth 2 (two) times daily. 10/30/18   Calvert Cantor, MD  atorvastatin (LIPITOR) 80 MG tablet Take 1 tablet (80 mg total) by mouth daily. 10/30/18 10/30/19  Calvert Cantor, MD  carvedilol (COREG) 3.125 MG tablet Take 1 tablet (3.125 mg total) by mouth 2 (two) times daily with a meal for 30 days. 11/05/18 12/05/18  Jerald Kief, MD  digoxin (LANOXIN) 0.125 MG tablet Take 1 tablet (0.125 mg total) by mouth daily. 10/31/18   Calvert Cantor, MD  furosemide (LASIX) 40 MG tablet Take 1 tablet (40 mg total) by mouth daily for 30 days. 11/06/18 12/06/18  Jerald Kief, MD  losartan (COZAAR) 25 MG tablet Take 1 tablet (25 mg total) by mouth daily. 10/31/18   Calvert Cantor, MD  nicotine (NICODERM CQ - DOSED IN MG/24 HOURS) 14 mg/24hr patch Place 1 patch (14 mg total) onto the skin daily. 10/31/18   Calvert Cantor, MD  pantoprazole (PROTONIX) 40 MG tablet Take 1 tablet (40 mg total) by mouth 2 (two) times daily before a meal. 10/30/18   Calvert Cantor, MD  polyethylene glycol (MIRALAX / GLYCOLAX) packet Take 17 g by mouth 2 (two) times daily. 10/30/18   Calvert Cantor, MD  rosuvastatin (CRESTOR) 20  MG tablet Take 1 tablet (20 mg total) by mouth daily at 6 PM for 30 days. 11/05/18 12/05/18  Jerald Kief, MD     Family History  Problem Relation Age of Onset   Alcoholism Father    Rheumatic fever Mother    Diabetes Mother    Drug abuse Brother     Social History   Socioeconomic History   Marital status: Married    Spouse name: Not on file   Number of children: Not on file   Years of education: Not on file   Highest education level: Not on file  Occupational History   Not on  file  Social Needs   Financial resource strain: Not on file   Food insecurity:    Worry: Not on file    Inability: Not on file   Transportation needs:    Medical: Not on file    Non-medical: Not on file  Tobacco Use   Smoking status: Never Smoker   Smokeless tobacco: Former Neurosurgeon    Types: Snuff  Substance and Sexual Activity   Alcohol use: Never    Frequency: Never   Drug use: Never   Sexual activity: Not on file  Lifestyle   Physical activity:    Days per week: Not on file    Minutes per session: Not on file   Stress: Not on file  Relationships   Social connections:    Talks on phone: Not on file    Gets together: Not on file    Attends religious service: Not on file    Active member of club or organization: Not on file    Attends meetings of clubs or organizations: Not on file    Relationship status: Not on file  Other Topics Concern   Not on file  Social History Narrative   Not on file     Review of Systems: A 12 point ROS discussed and pertinent positives are indicated in the HPI above.  All other systems are negative.  Review of Systems  Constitutional: Negative for fatigue and fever.  Respiratory: Positive for cough and shortness of breath.   Cardiovascular: Negative for chest pain.  Gastrointestinal: Negative for abdominal pain.  Musculoskeletal: Negative for back pain.  Psychiatric/Behavioral: Negative for behavioral problems and confusion.    Vital Signs: BP 94/70 (BP Location: Right Arm)    Pulse (!) 104    Temp 98.3 F (36.8 C) (Oral)    SpO2 98%   Physical Exam Vitals signs and nursing note reviewed.  Constitutional:      Appearance: Normal appearance.  HENT:     Mouth/Throat:     Mouth: Mucous membranes are moist.     Pharynx: Oropharynx is clear.  Cardiovascular:     Rate and Rhythm: Normal rate and regular rhythm.  Pulmonary:     Effort: Pulmonary effort is normal. No respiratory distress.  Skin:    General: Skin is warm  and dry.  Neurological:     General: No focal deficit present.     Mental Status: He is alert and oriented to person, place, and time. Mental status is at baseline.  Psychiatric:        Mood and Affect: Mood normal.        Behavior: Behavior normal.        Thought Content: Thought content normal.        Judgment: Judgment normal.        Imaging: Dg Chest 2 View  Addendum  Date: 11/12/2018   ADDENDUM REPORT: 11/12/2018 10:01 ADDENDUM: Critical Value/emergent results were called by telephone at the time of interpretation on 11/12/2018 at 9:56 a.m. to Dr. Claudette LawsANDREW KIRSTEINS , who verbally acknowledged these results. Electronically Signed   By: Elberta Fortisaniel  Boyle M.D.   On: 11/12/2018 10:01   Result Date: 11/12/2018 CLINICAL DATA:  Recent cough.  CHF. EXAM: CHEST - 2 VIEW COMPARISON:  10/01/2018 FINDINGS: Left-sided PICC line has tip over the SVC at the level of the cavoatrial junction. Left lung is clear. There is near complete opacification of the right lung with air-fluid level over the upper lung/apex compatible with large hydro versus hemothorax. Moderate cardiomegaly unchanged. Remainder of the exam is unchanged. IMPRESSION: Large right hydro versus hemothorax. Left-sided PICC line unchanged. Electronically Signed: By: Elberta Fortisaniel  Boyle M.D. On: 11/12/2018 09:42   Dg Abd 1 View  Result Date: 10/27/2018 CLINICAL DATA:  Ileus.  Morbid obesity. EXAM: ABDOMEN - 1 VIEW COMPARISON:  10/26/2018 FINDINGS: Nasogastric tube is again seen with tip overlying the distal stomach. Diffuse dilatation of the colon shows no significant change, most likely due to chronic ileus. IMPRESSION: Probable colonic ileus, without significant change. Electronically Signed   By: Myles RosenthalJohn  Stahl M.D.   On: 10/27/2018 19:19   Dg Abd 1 View  Result Date: 10/26/2018 CLINICAL DATA:  Ileus EXAM: ABDOMEN - 1 VIEW COMPARISON:  10/24/2018; 10/22/2018; 10/19/2018; CT abdomen pelvis-10/19/2018 FINDINGS: Enteric tube tip and side port overlies  the expected location of the gastric antrum. Grossly unchanged marked gaseous distention of the colon with index loop of colon within the left mid hemiabdomen measuring approximately 7.8 cm in diameter. No change to slight improvement of gaseous distention of several loops of small bowel within the left mid hemiabdomen with index loop measuring approximately 4.6 cm with potential bowel wall thickening. No pneumoperitoneum, pneumatosis or portal venous gas. No acute osseous abnormalities. IMPRESSION: Grossly unchanged findings most suggestive of ileus. Electronically Signed   By: Simonne ComeJohn  Watts M.D.   On: 10/26/2018 08:14   Dg Abd 1 View  Result Date: 10/24/2018 CLINICAL DATA:  Ileus. EXAM: ABDOMEN - 1 VIEW COMPARISON:  Radiographs of October 22, 2018. FINDINGS: Stable position of nasogastric tube with tip in expected position of distal stomach. Stable colonic dilatation is noted suggesting ileus. Minimal to mild small bowel dilatation is seen in the left side of the abdomen. IMPRESSION: Stable position of the nasogastric tube. Grossly stable colonic and small bowel dilatation is noted concerning for ileus. Electronically Signed   By: Lupita RaiderJames  Green Jr, M.D.   On: 10/24/2018 08:29   Dg Abd 1 View  Result Date: 10/22/2018 CLINICAL DATA:  Small-bowel obstruction. EXAM: ABDOMEN - 1 VIEW COMPARISON:  10/19/2018; 10/17/2018; CT abdomen pelvis-10/19/2018 FINDINGS: Enteric tube tip and side port project over the expected location of the gastric antrum. Grossly unchanged moderate to marked gaseous distention of several loops of large and small bowel with index loop of small bowel within the right mid hemiabdomen measuring 6.4 cm in diameter, grossly unchanged. Nondiagnostic evaluation for pneumoperitoneum secondary to exclusion of the lower thorax. No definite pneumatosis or portal venous gas. No acute osseous abnormalities. IMPRESSION: Similar findings most suggestive of ileus. Electronically Signed   By: Simonne ComeJohn  Watts M.D.    On: 10/22/2018 08:05   Dg Abd 1 View  Result Date: 10/19/2018 CLINICAL DATA:  Vomiting, abdominal distention. EXAM: ABDOMEN - 1 VIEW COMPARISON:  Radiographs of October 17, 2018. FINDINGS: Stable small bowel dilatation is noted concerning for distal small  bowel obstruction or possibly ileus. Possible right colonic dilatation is noted. Distal colon is nondilated. No radio-opaque calculi or other significant radiographic abnormality are seen. IMPRESSION: Small bowel dilatation is noted concerning for distal small bowel obstruction or possibly ileus. Continued radiographic follow-up is recommended. Possible right colonic dilatation is noted. Electronically Signed   By: Lupita Raider, M.D.   On: 10/19/2018 07:26   Dg Abd 1 View  Result Date: 10/17/2018 CLINICAL DATA:  Small bowel obstruction. EXAM: ABDOMEN - 1 VIEW COMPARISON:  10/16/2018 FINDINGS: Nasogastric tube in the upper abdomen and appears to be in the stomach body region. Again noted are dilated loops of small and large bowel. Bowel gas distension is similar to the previous examination. Limited evaluation for free air on these supine images. Stool in the rectum. IMPRESSION: Stable dilatation of small and large bowel loops. Differential diagnosis includes an ileus versus colonic obstruction. Nasogastric tube appears to be in the stomach. Electronically Signed   By: Richarda Overlie M.D.   On: 10/17/2018 09:19   Dg Abd 1 View  Result Date: 10/16/2018 CLINICAL DATA:  Nasogastric tube placement EXAM: ABDOMEN - 1 VIEW COMPARISON:  October 16, 2018 abdominal radiographs obtained earlier in the day FINDINGS: Nasogastric tube tip is at the gastroesophageal junction. There remains dilated bowel. No free air evident. IMPRESSION: Nasogastric tube tip at gastroesophageal junction. Advise advancing nasogastric tube approximately 8 cm to insure that tip and side port are well within the stomach. Persistent bowel dilatation noted. These results will be called to the  ordering clinician or representative by the Radiology Department at the imaging location. Electronically Signed   By: Bretta Bang III M.D.   On: 10/16/2018 13:34   Dg Abd 1 View  Result Date: 10/16/2018 CLINICAL DATA:  Hematemesis.  Distention. EXAM: ABDOMEN - 1 VIEW COMPARISON:  10/10/2018. FINDINGS: Prominently distended loops of small and large bowel are noted. Stool noted in the colon including the rectum. Findings most consistent adynamic ileus. Small or large bowel obstruction can not be excluded. Close follow-up exams suggested to demonstrate resolution. Pelvic calcifications consistent phleboliths. Degenerative change lumbar spine. IMPRESSION: Probably distended loops of small and large bowel noted. Stool noted in the colon including the rectum. Findings most consistent with adynamic ileus. Small or large bowel obstruction can not be excluded. Close follow-up exam suggested demonstrate resolution. Electronically Signed   By: Maisie Fus  Register   On: 10/16/2018 08:58   Ct Chest Wo Contrast  Result Date: 11/12/2018 CLINICAL DATA:  53 year old male with a history of pleural effusion EXAM: CT CHEST WITHOUT CONTRAST TECHNIQUE: Multidetector CT imaging of the chest was performed following the standard protocol without IV contrast. COMPARISON:  Chest x-ray 11/12/2018, 10/01/2018, abdomen CT 10/19/2018, abdomen CT 10/01/2018 FINDINGS: Cardiovascular: Cardiomegaly. Differential attenuation of the blood pool compatible with anemia. Advanced coronary calcifications of left main, left anterior descending, circumflex, right coronary artery. Normal course caliber and contour of the thoracic aorta. Diameter of the main pulmonary artery unremarkable. Mediastinum/Nodes: Multiple mediastinal lymph nodes, likely reactive. Paratracheal node measures 13 mm. Lymph nodes of the posterior right mediastinum, including index node on image 92 of series 3 measuring 11 mm. Unremarkable course of the thoracic esophagus.  Lungs/Pleura: Fluid and gas collection of the right pleural space contributes to complete volume loss/atelectasis of the right lower lobe, complete atelectasis of the posterior segment of the right upper lobe, complete atelectasis of the lateral segment of the middle low. Evidence of pleural thickening, though not completely characterized given the  absence of contrast. Necrotic changes of the right lower lobe which were present on the CT of 10/19/2018, incompletely characterized given the absence of contrast. Right-sided fluid measures 17 Hounsfield units. Mild paraseptal emphysema at the left lung apex. Atelectasis of the left lower lobe lateral segment. No left-sided pleural effusion. No confluent airspace disease. No edema. Upper Abdomen: No acute finding of the upper abdomen. Musculoskeletal: No acute displaced fracture. Degenerative changes of the spine. Left upper extremity PICC appears to terminate superior vena cava. IMPRESSION: Right-sided empyema, with complete collapse of the right lower lobe, and partial collapse of the right middle lobe and right upper lobe. Reactive mediastinal lymph nodes. These results were discussed by telephone at the time of interpretation on 11/12/2018 at 1:33 pm with Dr. Rinaldo Cloud LOVE. Cardiomegaly and evidence of anemia. Associated left main and 3 vessel coronary artery disease. Electronically Signed   By: Gilmer Mor D.O.   On: 11/12/2018 13:36   Ct Abdomen Pelvis W Contrast  Result Date: 10/19/2018 CLINICAL DATA:  Small bowel obstruction. EXAM: CT ABDOMEN AND PELVIS WITH CONTRAST TECHNIQUE: Multidetector CT imaging of the abdomen and pelvis was performed using the standard protocol following bolus administration of intravenous contrast. CONTRAST:  OMNIPAQUE IOHEXOL 300 MG/ML  SOLN COMPARISON:  CT scan of October 01, 2018. FINDINGS: Lower chest: Moderate right pleural effusion is noted with adjacent right lower lobe atelectasis. Hepatobiliary: No focal liver abnormality  is seen. No gallstones, gallbladder wall thickening, or biliary dilatation. Pancreas: Unremarkable. No pancreatic ductal dilatation or surrounding inflammatory changes. Spleen: Normal in size without focal abnormality. Adrenals/Urinary Tract: Adrenal glands appear normal. Large right renal cyst is noted. No hydronephrosis or renal obstruction is noted. No renal or ureteral calculi are noted. Urinary bladder is unremarkable. Stomach/Bowel: Stomach appears normal. Proximal small bowel dilatation is noted concerning for distal small bowel obstruction or possibly ileus. No definite transition zone is noted. The appendix is not visualized, although no inflammation is noted in the right lower quadrant. Mild wall thickening of the rectum is noted suggesting possible inflammation. Vascular/Lymphatic: Aortic atherosclerosis. No enlarged abdominal or pelvic lymph nodes. Reproductive: Prostate is unremarkable. Other: No abdominal wall hernia or abnormality. No abdominopelvic ascites. Musculoskeletal: No acute or significant osseous findings. IMPRESSION: Proximal small bowel dilatation is noted concerning for distal small bowel obstruction or possibly ileus. No definite focal transition zone is noted. Mild wall thickening of the rectum is noted suggesting possible inflammation. Moderate right pleural effusion with adjacent right lower lobe subsegmental atelectasis. Aortic Atherosclerosis (ICD10-I70.0). Electronically Signed   By: Lupita Raider, M.D.   On: 10/19/2018 13:24   Ct Tibia Fibula Right Wo Contrast  Result Date: 10/15/2018 CLINICAL DATA:  Burning, cramping, numbness on right upper leg. EXAM: CT OF THE RIGHT FEMUR WITHOUT CONTRAST CT OF THE RIGHT TIBIA AND FIBULA WITHOUT CONTRAST CT OF THE RIGHT FOOT WITHOUT CONTRAST TECHNIQUE: Multidetector CT imaging of the right lower extremity was performed according to the standard protocol. COMPARISON:  None. FINDINGS: Bones/Joint/Cartilage FEMUR: No fracture or dislocation.  Normal alignment. No joint effusion. No aggressive osseous lesion. TIBIA AND FIBULA: No fracture or dislocation. Normal alignment. No joint effusion. No aggressive osseous lesion. FOOT: No fracture or dislocation. Normal alignment. No joint effusion. No aggressive osseous lesion. Ligaments Ligaments are suboptimally evaluated by CT. Muscles and Tendons Expansion of the right rectus femoris muscle with an intramuscular hematoma measuring 6.7 x 5.7 x 2.5 cm. Remainder the muscles are normal. No other intramuscular fluid collection or hematoma. No muscle  atrophy. Quadriceps tendon and patellar tendon are intact. Flexor, extensor, peroneal and Achilles tendons are intact. Soft tissue No fluid collection or hematoma. No soft tissue mass. Generalized soft tissue edema around the right foot likely reactive. Mild soft tissue edema around the right knee. IMPRESSION: 1. Large intramuscular hematoma in the right rectus femoris muscle. Electronically Signed   By: Elige Ko   On: 10/15/2018 16:57   Ct Foot Right Wo Contrast  Result Date: 10/15/2018 CLINICAL DATA:  Burning, cramping, numbness on right upper leg. EXAM: CT OF THE RIGHT FEMUR WITHOUT CONTRAST CT OF THE RIGHT TIBIA AND FIBULA WITHOUT CONTRAST CT OF THE RIGHT FOOT WITHOUT CONTRAST TECHNIQUE: Multidetector CT imaging of the right lower extremity was performed according to the standard protocol. COMPARISON:  None. FINDINGS: Bones/Joint/Cartilage FEMUR: No fracture or dislocation. Normal alignment. No joint effusion. No aggressive osseous lesion. TIBIA AND FIBULA: No fracture or dislocation. Normal alignment. No joint effusion. No aggressive osseous lesion. FOOT: No fracture or dislocation. Normal alignment. No joint effusion. No aggressive osseous lesion. Ligaments Ligaments are suboptimally evaluated by CT. Muscles and Tendons Expansion of the right rectus femoris muscle with an intramuscular hematoma measuring 6.7 x 5.7 x 2.5 cm. Remainder the muscles are  normal. No other intramuscular fluid collection or hematoma. No muscle atrophy. Quadriceps tendon and patellar tendon are intact. Flexor, extensor, peroneal and Achilles tendons are intact. Soft tissue No fluid collection or hematoma. No soft tissue mass. Generalized soft tissue edema around the right foot likely reactive. Mild soft tissue edema around the right knee. IMPRESSION: 1. Large intramuscular hematoma in the right rectus femoris muscle. Electronically Signed   By: Elige Ko   On: 10/15/2018 16:57   Dg Abd Portable 1v  Result Date: 10/30/2018 CLINICAL DATA:  Ileus, NG tube EXAM: PORTABLE ABDOMEN - 1 VIEW COMPARISON:  10/27/2018 FINDINGS: NG tube remains in stable position. Gaseous distention of bowel again noted, unchanged. This likely reflects ileus as large and small bowel are involved. No free air or organomegaly. IMPRESSION: Continued diffuse gaseous distention of bowel, likely ileus. No change. Electronically Signed   By: Charlett Nose M.D.   On: 10/30/2018 07:37   Dg Abd Portable 1v  Result Date: 10/19/2018 CLINICAL DATA:  Nasogastric tube repositioning EXAM: PORTABLE ABDOMEN - 1 VIEW COMPARISON:  October 19, 2018 study obtained earlier in the day FINDINGS: Nasogastric tube tip and side port in stomach. There are loops of dilated bowel consistent with either ileus or a degree of obstruction. No free air. There is consolidation in the left base. IMPRESSION: Nasogastric tube tip and side port in stomach. Loops of dilated bowel persist. No free air. Consolidation left lung base. Electronically Signed   By: Bretta Bang III M.D.   On: 10/19/2018 19:59   Dg Abd Portable 1v  Result Date: 10/19/2018 CLINICAL DATA:  Repositioning of nasogastric tube EXAM: PORTABLE ABDOMEN - 1 VIEW COMPARISON:  Portable exam 1644 hours compared to 1526 hours FINDINGS: Nasogastric tube coiled in distal esophagus. Air-filled distended loops of large and small bowel in the upper abdomen. Opacified LEFT lung base.  Bones demineralized. IMPRESSION: Nasogastric tube remains coiled at the distal esophagus. Electronically Signed   By: Ulyses Southward M.D.   On: 10/19/2018 17:14   Dg Abd Portable 1v  Result Date: 10/19/2018 CLINICAL DATA:  Check gastric catheter placement EXAM: PORTABLE ABDOMEN - 1 VIEW COMPARISON:  None. FINDINGS: Scattered large and small bowel gas is noted. Gastric catheter is noted coiled upon itself in the  distal esophagus. It does not appear to enter the stomach. This should be withdrawn slightly and readvanced. IMPRESSION: Gastric catheter appears coiled upon itself within the distal esophagus. Electronically Signed   By: Alcide Clever M.D.   On: 10/19/2018 15:46   Ct Extremity Lower Right Wo Contrast  Result Date: 10/15/2018 CLINICAL DATA:  Burning, cramping, numbness on right upper leg. EXAM: CT OF THE RIGHT FEMUR WITHOUT CONTRAST CT OF THE RIGHT TIBIA AND FIBULA WITHOUT CONTRAST CT OF THE RIGHT FOOT WITHOUT CONTRAST TECHNIQUE: Multidetector CT imaging of the right lower extremity was performed according to the standard protocol. COMPARISON:  None. FINDINGS: Bones/Joint/Cartilage FEMUR: No fracture or dislocation. Normal alignment. No joint effusion. No aggressive osseous lesion. TIBIA AND FIBULA: No fracture or dislocation. Normal alignment. No joint effusion. No aggressive osseous lesion. FOOT: No fracture or dislocation. Normal alignment. No joint effusion. No aggressive osseous lesion. Ligaments Ligaments are suboptimally evaluated by CT. Muscles and Tendons Expansion of the right rectus femoris muscle with an intramuscular hematoma measuring 6.7 x 5.7 x 2.5 cm. Remainder the muscles are normal. No other intramuscular fluid collection or hematoma. No muscle atrophy. Quadriceps tendon and patellar tendon are intact. Flexor, extensor, peroneal and Achilles tendons are intact. Soft tissue No fluid collection or hematoma. No soft tissue mass. Generalized soft tissue edema around the right foot likely  reactive. Mild soft tissue edema around the right knee. IMPRESSION: 1. Large intramuscular hematoma in the right rectus femoris muscle. Electronically Signed   By: Elige Ko   On: 10/15/2018 16:57    Labs:  CBC: Recent Labs    11/06/18 0511 11/08/18 0904 11/11/18 0459 11/12/18 1047  WBC 8.0 7.6 8.7 8.8  HGB 7.4* 7.1* 7.0* 7.2*  HCT 24.5* 23.5* 23.5* 23.9*  PLT 304 306 334 341    COAGS: Recent Labs    10/01/18 1353 10/02/18 0527  INR 1.4* 1.4*    BMP: Recent Labs    11/01/18 0348 11/02/18 0452 11/06/18 0511 11/11/18 0459  NA 133* 134* 137 138  K 3.5 3.5 3.7 4.0  CL 100 99 100 98  CO2 27 27 31  32  GLUCOSE 112* 105* 109* 105*  BUN 26* 23* 19 20  CALCIUM 7.8* 7.9* 8.0* 8.1*  CREATININE 0.87 0.88 0.92 0.83  GFRNONAA >60 >60 >60 >60  GFRAA >60 >60 >60 >60    LIVER FUNCTION TESTS: Recent Labs    10/03/18 0517 10/06/18 0527 10/08/18 0604 11/06/18 0511  BILITOT 4.3* 2.9* 2.4* 0.9  AST 57* 52* 62* 63*  ALT 154* 87* 82* 86*  ALKPHOS 138* 108 124 139*  PROT 5.8* 5.2* 5.5* 5.6*  ALBUMIN 2.7* 2.1* 2.2* 1.5*    TUMOR MARKERS: No results for input(s): AFPTM, CEA, CA199, CHROMGRNA in the last 8760 hours.  Assessment and Plan: Empyema Patient with history of pneumonia and RLL effusion.  Now with progressive dyspnea.  CT Chest shows likely empyema.  TCTS following, recommended IR chest tube placement.  Case reviewed by Dr. Lowella Dandy who approves procedure.  Patient is on eliquis with last dose today, however due to acuity of condition will plan to proceed with placement.  Not NPO today.  IV pain management for procedure today.   Risks and benefits discussed with the patient including bleeding, infection, damage to adjacent structures, and sepsis.  All of the patient's questions were answered, patient is agreeable to proceed. Consent signed and in chart.  Thank you for this interesting consult.  I greatly enjoyed meeting Aaron [name] and look  forward to  participating in their care.  A copy of this report was sent to the requesting provider on this date.  Electronically Signed: Hoyt Koch, PA 11/12/2018, 3:48 PM   I spent a total of 40 Minutes    in face to face in clinical consultation, greater than 50% of which was counseling/coordinating care for empyema.

## 2018-11-12 NOTE — H&P (Addendum)
History and Physical    Aaron [name] GEX:[id number] DOB: Nov 29, 1965 DOA: (Not on file)  I have briefly reviewed the patient's prior medical records in [facility]  PCP: Darrin Nipperollege, [facility] [place]  Patient coming from: inpatient rehab   Chief Complaint: progressive dyspnea  HPI: Aaron [name] is a 53 y.o. male with a quite complicated recent history as below.  To recap, he was hospitalized for more than a month up until 4/9 when he was transferred to Inpatient rehab. Please refer to d/c summary by Dr. Rhona Leavenshiu on 4/9 for full details. In brief, he was admitted to the hospital in March with signigificant weight gain / anasarca. He was found to be in heart failure with EF of 15% and cardiology was consulted. Underwent workup which revealed ischemic cardiomyopathy with severe 3 vessel CAD. Cardiothoracic surgery was consulted and felt like he was not a candidate for CABG. Hospital course was complicated by A fib with RVR requiring anticoagulation, however he developed spontaneous right thigh hematoma as well as a GI bleed. GI was consulted and underwent an EGD on 3/22 with gastritis and LA grade C esophagitis. He was monitored off anticoagulation, eventually stabilized and Eliquis was resumed prior to discharge and seems to have tolerated it well. His hospital course was also complicated by an adynamic ileus / SBO requiring an NG tube, improved with conservative management.   Called by Delle ReiningPamela Love, NP with rehab regarding [name] regarding progressive weakness and worsening shortness of breath in the past 2-3 days. Patient evaluated at bedside, he is known to me as I have cared for him for a few days during most recent hospital stay. He was progressing well with his rehab up until few days ago when he was feeling like his exertional shortness of breath has worsened again. He tells me that he also experienced occasional lightheadedness and orthostatic symptoms upon standing up along with  worsening dyspnea with just few steps. When he gets short of breath he also is coughing and bringing up small amounts of dark sputum. He denies any chest pain, denies any palpitations. He denies fevers or chills. Has a good appetite.   Today, with his worsening dyspnea patient underwent a CXR which showed concern for hydro vs hemothorax and we are asked to admit to inpatient side due to lack of monitored beds at rehab.   Chest x-ray was followed by CT scan of the chest which showed concern for empyema.  Cardiothoracic surgery consulted, discussed with Dr. Dorris FetchHendrickson, he recommends pigtail placement by interventional radiology and potentially thrombolytics by cardiothoracic surgery in the next few days.  Review of Systems: As per HPI otherwise 10 point review of systems negative.   Past Medical History:  Diagnosis Date  . Morbid obesity (HCC)     Past Surgical History:  Procedure Laterality Date  . ESOPHAGOGASTRODUODENOSCOPY (EGD) WITH PROPOFOL N/A 10/18/2018   Procedure: ESOPHAGOGASTRODUODENOSCOPY (EGD) WITH PROPOFOL;  Surgeon: Kerin SalenKarki, Arya, MD;  [facility];  Service: Gastroenterology;  Laterality: N/A;  . RIGHT/LEFT HEART CATH AND CORONARY ANGIOGRAPHY N/A 10/09/2018   Procedure: RIGHT/LEFT HEART CATH AND CORONARY ANGIOGRAPHY;  Surgeon: Iran OuchArida, Muhammad A, MD;  Location: [facility] INVASIVE CV LAB;  Service: Cardiovascular;  Laterality: N/A;     reports that he has never smoked. He has quit using smokeless tobacco.  His smokeless tobacco use included snuff. He reports that he does not drink alcohol or use drugs.  No Known Allergies  Family History  Problem Relation Age of  Onset  . Alcoholism Father   . Rheumatic fever Mother   . Diabetes Mother   . Drug abuse Brother     Prior to Admission medications   Medication Sig Start Date End Date Taking? Authorizing Provider  acetaminophen (TYLENOL) 325 MG tablet Take 2 tablets (650 mg total) by mouth every 4 (four) hours as needed for  headache or mild pain. 10/30/18   Calvert Cantor, MD  alum & mag hydroxide-simeth (MAALOX/MYLANTA) 200-200-20 MG/5ML suspension Take 30 mLs by mouth every 4 (four) hours as needed for indigestion or heartburn. 10/30/18   Calvert Cantor, MD  amiodarone (PACERONE) 200 MG tablet Take 1 tablet (200 mg total) by mouth 2 (two) times daily. 10/30/18   Calvert Cantor, MD  apixaban (ELIQUIS) 5 MG TABS tablet Take 1 tablet (5 mg total) by mouth 2 (two) times daily. 10/30/18   Calvert Cantor, MD  atorvastatin (LIPITOR) 80 MG tablet Take 1 tablet (80 mg total) by mouth daily. 10/30/18 10/30/19  Calvert Cantor, MD  carvedilol (COREG) 3.125 MG tablet Take 1 tablet (3.125 mg total) by mouth 2 (two) times daily with a meal for 30 days. 11/05/18 12/05/18  Jerald Kief, MD  digoxin (LANOXIN) 0.125 MG tablet Take 1 tablet (0.125 mg total) by mouth daily. 10/31/18   Calvert Cantor, MD  furosemide (LASIX) 40 MG tablet Take 1 tablet (40 mg total) by mouth daily for 30 days. 11/06/18 12/06/18  Jerald Kief, MD  losartan (COZAAR) 25 MG tablet Take 1 tablet (25 mg total) by mouth daily. 10/31/18   Calvert Cantor, MD  nicotine (NICODERM CQ - DOSED IN MG/24 HOURS) 14 mg/24hr patch Place 1 patch (14 mg total) onto the skin daily. 10/31/18   Calvert Cantor, MD  pantoprazole (PROTONIX) 40 MG tablet Take 1 tablet (40 mg total) by mouth 2 (two) times daily before a meal. 10/30/18   Calvert Cantor, MD  polyethylene glycol (MIRALAX / GLYCOLAX) packet Take 17 g by mouth 2 (two) times daily. 10/30/18   Calvert Cantor, MD  rosuvastatin (CRESTOR) 20 MG tablet Take 1 tablet (20 mg total) by mouth daily at 6 PM for 30 days. 11/05/18 12/05/18  Jerald Kief, MD    Physical Exam: There were no vitals filed for this visit.    Constitutional: NAD, calm, comfortable Eyes: PERRL, lids and conjunctivae normal ENMT: Mucous membranes are moist. Posterior pharynx clear of any exudate or lesions.Normal dentition.  Neck: normal, supple, no masses, no thyromegaly Respiratory:  clear to auscultation bilaterally, no wheezing, no crackles. Normal respiratory effort. No accessory muscle use.  Cardiovascular: Regular rate and rhythm, no murmurs / rubs / gallops. No extremity edema. 2+ pedal pulses.  Abdomen: no tenderness, no masses palpated. Bowel sounds positive.  Musculoskeletal: no clubbing / cyanosis. Normal muscle tone.  Skin: no rashes, lesions, ulcers. No induration Neurologic: CN 2-12 grossly intact. Strength 5/5 in all 4.  Psychiatric: Normal judgment and insight. Alert and oriented x 3. Normal mood.   Labs on Admission: I have personally reviewed following labs and imaging studies  CBC: Recent Labs  Lab 11/06/18 0511 11/08/18 0904 11/11/18 0459  WBC 8.0 7.6 8.7  NEUTROABS 6.9  --   --   HGB 7.4* 7.1* 7.0*  HCT 24.5* 23.5* 23.5*  MCV 91.1 91.8 92.9  PLT 304 306 334   Basic Metabolic Panel: Recent Labs  Lab 11/06/18 0511 11/11/18 0459  NA 137 138  K 3.7 4.0  CL 100 98  CO2 31 32  GLUCOSE  109* 105*  BUN 19 20  CREATININE 0.92 0.83  CALCIUM 8.0* 8.1*   GFR: Estimated Creatinine Clearance: 150.8 mL/min (by C-G formula based on SCr of 0.83 mg/dL). Liver Function Tests: Recent Labs  Lab 11/06/18 0511  AST 63*  ALT 86*  ALKPHOS 139*  BILITOT 0.9  PROT 5.6*  ALBUMIN 1.5*   No results for input(s): LIPASE, AMYLASE in the last 168 hours. No results for input(s): AMMONIA in the last 168 hours. Coagulation Profile: No results for input(s): INR, PROTIME in the last 168 hours. Cardiac Enzymes: No results for input(s): CKTOTAL, CKMB, CKMBINDEX, TROPONINI in the last 168 hours. BNP (last 3 results) No results for input(s): PROBNP in the last 8760 hours. HbA1C: No results for input(s): HGBA1C in the last 72 hours. CBG: No results for input(s): GLUCAP in the last 168 hours. Lipid Profile: No results for input(s): CHOL, HDL, LDLCALC, TRIG, CHOLHDL, LDLDIRECT in the last 72 hours. Thyroid Function Tests: No results for input(s): TSH,  T4TOTAL, FREET4, T3FREE, THYROIDAB in the last 72 hours. Anemia Panel: No results for input(s): VITAMINB12, FOLATE, FERRITIN, TIBC, IRON, RETICCTPCT in the last 72 hours. Urine analysis:    Component Value Date/Time   COLORURINE AMBER (A) 10/01/2018 1523   APPEARANCEUR HAZY (A) 10/01/2018 1523   LABSPEC 1.023 10/01/2018 1523   PHURINE 5.0 10/01/2018 1523   GLUCOSEU NEGATIVE 10/01/2018 1523   HGBUR SMALL (A) 10/01/2018 1523   BILIRUBINUR NEGATIVE 10/01/2018 1523   KETONESUR NEGATIVE 10/01/2018 1523   PROTEINUR NEGATIVE 10/01/2018 1523   NITRITE NEGATIVE 10/01/2018 1523   LEUKOCYTESUR NEGATIVE 10/01/2018 1523     Radiological Exams on Admission: Dg Chest 2 View  Addendum Date: 11/12/2018   ADDENDUM REPORT: 11/12/2018 10:01 ADDENDUM: Critical Value/emergent results were called by telephone at the time of interpretation on 11/12/2018 at 9:56 a.m. to Dr. Claudette Laws , who verbally acknowledged these results. Electronically Signed   By: Elberta Fortis M.D.   On: 11/12/2018 10:01   Result Date: 11/12/2018 CLINICAL DATA:  Recent cough.  CHF. EXAM: CHEST - 2 VIEW COMPARISON:  10/01/2018 FINDINGS: Left-sided PICC line has tip over the SVC at the level of the cavoatrial junction. Left lung is clear. There is near complete opacification of the right lung with air-fluid level over the upper lung/apex compatible with large hydro versus hemothorax. Moderate cardiomegaly unchanged. Remainder of the exam is unchanged. IMPRESSION: Large right hydro versus hemothorax. Left-sided PICC line unchanged. Electronically Signed: By: Elberta Fortis M.D. On: 11/12/2018 09:42    Assessment/Plan Active Problems:   Acute systolic CHF (congestive heart failure) (HCC)   Acute hypoxemic respiratory failure (HCC)   Morbid obesity (HCC)   Atrial fibrillation with rapid ventricular response (HCC)   Hypokalemia   Protein-calorie malnutrition, severe   Hematoma of right thigh   Acute blood loss anemia   Orthostatic  hypotension   Principal Problem Acute on chronic hypoxic respiratory failure due to empyema -Chest x-ray concerning for hydrothorax versus hemothorax.  CT scan of the chest concerning for empyema, discussed with Dr. Dorris Fetch with cardiothoracic surgery and he would recommend IR to place a pigtail catheter and they may do thrombolytics in the next few days.  Patient is on Eliquis and apparently last dose was given this morning in inpatient rehab prior to chest x-ray and for now will place anticoagulation on hold -Move patient from to rehab to acute inpatient medicine -We will also obtain cultures and start broad-spectrum antibiotics  Active Problems Chronic systolic CHF -See discussion  above, EF 15%, continue Lasix.  Given intermittent hypotension and symptomatic orthostasis will hold ARB for now  Paroxysmal A. fib with RVR -hold Eliquis pending IR evaluation, continue digoxin as well as amiodarone, continue Coreg  Ischemic cardiomyopathy -Continue Coreg, statin  History of acute blood loss anemia during prior hospital stay -Hemoglobin has overall been stable the last several days, currently at 7.2.  Continue to monitor, transfuse for hemoglobin less than 7  Right thigh hematoma -Seen by Ortho during prior hospital stay, Dr. August Saucer recommended Bledsoe brace when up.  This issue appears to have been stable with patient back on anticoagulation  History of GI bleed during prior hospital stay -Status post EGD work-up by GI as in the HPI, these again seems to have been stable with patient back on anticoagulation  History of adynamic ileus during prior hospital stay -No issues currently, no nausea or vomiting, good appetite and having regular bowel movements  Severe protein calorie malnutrition Morbid obesity   DVT prophylaxis: SCDs Code Status: Full code Family Communication: No family at bedside Disposition Plan: Transfer to cardiac telemetry Consults called: Cardiothoracic surgery    Pamella Pert, MD, PhD [facility]  Contact via [email or web address]  [facility] Office Info P: [phone]  F: [phone]   11/12/2018, 10:56 AM

## 2018-11-12 NOTE — Progress Notes (Signed)
Occupational Therapy Note  Patient Details  Name: Olegario Doctor MRN: [id number] Date of Birth: [birth year]  Today's Date: 11/12/2018 OT Missed Time: 60 Minutes Missed Time Reason: X-Ray  Pt being transported out of room for chest x-ray. OT will attempt again at next scheduled time.    Alen Bleacher 11/12/2018, 9:23 AM

## 2018-11-12 NOTE — Progress Notes (Addendum)
Patient was sent for DG chest x-ray due to a wet, productive cough with tan sputum. Patient was also exhibiting SOB and drop in BP while trying to participate in therapy.. Waiting to be taken down for CT scan to determine or rule out hemothorax or hydrothroax. The  patient will be moved with all personal belonging to an acute room following the scan results. Patient is resting in bed with alarm on and call bell within reach. Patient is currently on bedrest and all staff is aware. Report given to 3E-14.

## 2018-11-12 NOTE — Progress Notes (Signed)
Physical Therapy Note  Patient Details  Name: Aaron [name] MRN: [id number] Date of Birth: 05/04/1966 Today's Date: 11/12/2018    Per RN, patient on medical hold/bed rest and is being transferred back to acute due to possible hemothorax.  60 minutes of skilled therapy time missed due to medical status.   Nedra Hai PT, DPT, CBIS  Supplemental Physical Therapist [facility]    Pager [phone] Acute Rehab Office [phone]    11/12/2018, 11:19 AM

## 2018-11-12 NOTE — Progress Notes (Signed)
Pharmacy Antibiotic Note  Aaron [name] is a 53 y.o. male admitted on 11/12/2018 with progressive dyspnea. The patient was transferred today 11/12/18 from Inpatient rehab.  Recent complicated medical history.  Recent  Hospitalization 10/01/18 to 11/05/18  Pharmacy has been consulted for vancomycin and zosyn dosing for empyema. Weight 131.8 kg, Height 6 feet 4 inches  Plan: Zosyn 3.375 gm IV q8h, infuse each dose over 4 hours Vancomycin 2500 mg IV x1 loading dose, then give  Vancomycin 1750 mg IV Q 12 hrs. Goal AUC 400-550. Expected AUC: 491 SCr used: 0.83      Temp (24hrs), Avg:98.5 F (36.9 C), Min:98.3 F (36.8 C), Max:98.6 F (37 C)  Recent Labs  Lab 11/06/18 0511 11/08/18 0904 11/11/18 0459 11/12/18 1047  WBC 8.0 7.6 8.7 8.8  CREATININE 0.92  --  0.83  --     Estimated Creatinine Clearance: 150.8 mL/min (by C-G formula based on SCr of 0.83 mg/dL).    No Known Allergies  Antimicrobials this admission: Vanc 4/16>> Zosyn 4/16>>   Dose adjustments this admission:   Microbiology results: 4/16 BCx : ordered  Thank you for allowing pharmacy to be a part of this patient's care. Noah Delaine, RPh Clinical Pharmacist Pager: [phone] Please check AMION for all [facility] Pharmacy phone numbers After 10:00 PM, call Main Pharmacy [phone] 11/12/2018 3:39 PM

## 2018-11-12 NOTE — Progress Notes (Addendum)
[facility] PHYSICAL MEDICINE & REHABILITATION PROGRESS NOTE   Subjective/Complaints:  occ cough brownish sputum reported but not seen  ROS- neg CP, SOB, N/V/D   Objective:   No results found. Recent Labs    11/11/18 0459  WBC 8.7  HGB 7.0*  HCT 23.5*  PLT 334   Recent Labs    11/11/18 0459  NA 138  K 4.0  CL 98  CO2 32  GLUCOSE 105*  BUN 20  CREATININE 0.83  CALCIUM 8.1*    Intake/Output Summary (Last 24 hours) at 11/12/2018 0740 Last data filed at 11/12/2018 2694 Gross per 24 hour  Intake 840 ml  Output 925 ml  Net -85 ml     Physical Exam: Vital Signs Blood pressure 99/76, pulse (!) 104, temperature 98.6 F (37 C), temperature source Oral, resp. rate 17, weight 131.8 kg, SpO2 96 %.   General: No acute distress Mood and affect are appropriate Heart: Regular rate and rhythm no rubs murmurs or extra sounds Lungs: Clear to auscultation, breathing unlabored, no rales or wheezes Abdomen: Positive bowel sounds, soft nontender to palpation, nondistended Extremities: No clubbing, cyanosis, or edema Skin: No evidence of breakdown, no evidence of rash Neurologic: Cranial nerves II through XII intact, motor strength is 5/5 in bilateral deltoid, bicep, tricep, grip,2- right and 3- left  hip flexor, knee extensors, ankle dorsiflexor and plantar flexor Sensory exam normal sensation to light touch and proprioception in bilateral upper and lower extremities Cerebellar exam normal finger to nose to finger as well as heel to shin in bilateral upper and lower extremities Musculoskeletal: Full range of motion in all 4 extremities. No joint swelling   Assessment/Plan: 1. Functional deficits secondary to severe debility CHF which require 3+ hours per day of interdisciplinary therapy in a comprehensive inpatient rehab setting.  Physiatrist is providing close team supervision and 24 hour management of active medical problems listed below.  Physiatrist and rehab team continue  to assess barriers to discharge/monitor patient progress toward functional and medical goals  Care Tool:  Bathing    Body parts bathed by patient: Right arm, Left arm, Chest, Abdomen, Front perineal area, Face   Body parts bathed by helper: Buttocks, Right lower leg, Left lower leg     Bathing assist Assist Level: 2 Helpers     Upper Body Dressing/Undressing Upper body dressing   What is the patient wearing?: Hospital gown only    Upper body assist Assist Level: Minimal Assistance - Patient > 75%    Lower Body Dressing/Undressing Lower body dressing      What is the patient wearing?: Incontinence brief     Lower body assist Assist for lower body dressing: 2 Helpers     Toileting Toileting Toileting Activity did not occur (Clothing management and hygiene only): N/A (no void or bm)  Toileting assist Assist for toileting: 2 Helpers     Transfers Chair/bed transfer  Transfers assist     Chair/bed transfer assist level: Dependent - mechanical lift     Locomotion Ambulation   Ambulation assist   Ambulation activity did not occur: Safety/medical concerns          Walk 10 feet activity   Assist  Walk 10 feet activity did not occur: Safety/medical concerns        Walk 50 feet activity   Assist Walk 50 feet with 2 turns activity did not occur: Safety/medical concerns         Walk 150 feet activity   Assist Walk  150 feet activity did not occur: Safety/medical concerns         Walk 10 feet on uneven surface  activity   Assist Walk 10 feet on uneven surfaces activity did not occur: Safety/medical concerns         Wheelchair     Assist Will patient use wheelchair at discharge?: Yes      Wheelchair assist level: Minimal Assistance - Patient > 75% Max wheelchair distance: 75    Wheelchair 50 feet with 2 turns activity    Assist        Assist Level: Minimal Assistance - Patient > 75%   Wheelchair 150 feet activity      Assist Wheelchair 150 feet activity did not occur: Safety/medical concerns        Medical Problem List and Plan: 1.Deconditioningsecondary to congestive heart failure, ischemic cardiomyopathy CIR PT, OT Team conference today please see physician documentation under team conference tab, met with team face-to-face to discuss problems,progress, and goals. Formulized individual treatment plan based on medical history, underlying problem and comorbidities..Have written cardiac precaution based on EF 15% Mobilize slowly, ortho  to change unna boots 2. Antithrombotics: -DVT/anticoagulation:Pharmaceutical:Other (comment)--Eliquis -antiplatelet therapy: Low-dose ASA. 3. Pain Management:  4. Mood:We will monitor and involve neuropsychology as needed -antipsychotic agents:None 5. Neuropsych: This patientiscapable of making decisions on hisown behalf. 6. Skin/Wound Care:routine pressure relief measures. 7. Fluids/Electrolytes/Nutrition:Monitor I/O. Check lytes in am.  8.Systolic CHF/AKI: Heart healthy diet monitor weights daily. Managed with losartan, Lasix,digoxin,Coreg and Lipitor.Monitor renal status with serial checks.  9.Severe CAD: Aggressive medical therapy recommended at this time. On Coreg, losartan, ASA and statin. Off Plavix due to needingeliquis for Afib 10.A. fib with RVR: Monitor heart rate twice daily. Continue digoxin,Coreg and amiodarone.On Eliquis Vitals:   11/11/18 1950 11/12/18 0604  BP: (!) 143/124 99/76  Pulse: (!) 104 (!) 104  Resp: 18 17  Temp: 98.6 F (37 C)   SpO2: 96% 96%  HR labile,BP with orthosttic changes and symptomatic per PT.  Using tilt table function of hospital bed to mobilize May benefit from repeat transfusion , given severity of CHF would like cardiology input.   11.Right thigh hematoma with femoral plexopathy: Bledsoe brace for support. does not appear to be expanding 12.Upper GI  bleed: Monitor H&H with serial checks. Continue Protonix twice daily.  Off ASA but last GI note indicates this is ok however Hgb drifting down, ? Need for repeat transfusion ask for cardiology input 13.ABLA/iron deficiency anemia/folic acid deficiency:Low iron with elevated ferritin-- Add folic acid. Question IV iron for supplementation.  Hgb trending downward will recheck in am 14. Hyponatremia: Recheck lytes in am.  15.  Hx of recent ileus,now with BMs 16.  Cough Afebrile, no SOB CXR per Radiology "white out Right lung" ? Hemothorax, check CT chest will ask hospitalist team to assess, likely will need Chest Tube vs thoracentesis, would rec transfer to acute care to stabilize, pt tolerating well at this point but given multiple comorbid conditions can deteriorate rapidly Order bedrest LOS: 7 days A FACE TO FACE EVALUATION WAS PERFORMED  Charlett Blake 11/12/2018, 7:40 AM

## 2018-11-12 NOTE — Progress Notes (Addendum)
1750: Patient back from IR for chest tube placement. Dressing dry and intact. Chest tube connected to -20 wall suction. Assessment complete. V/s done. Will continue to monitor.  Ernestina Columbia. RN

## 2018-11-12 NOTE — Consult Note (Signed)
Reason for Consult:Right pleural effusion Referring Physician: Dr. Bebe Liter Aaron [name] is an 53 y.o. male.  HPI: 53 yo man with a complex recent medical history noted today to have a large right pleural effusion.  He has a past history of tobacco abuse and morbid obesity. He was admitted in March with epigastric pain and anasarca.  He was found to have severe left ventricular dysfunction with ejection fraction of 15% with moderate mitral regurgitation.  Cardiac catheterization on 10/09/2018 showed multivessel coronary disease.  Decision was made to treat medically.  During his admission he had atrial fibrillation.  He was anticoagulated for that.  He developed a large right thigh hematoma and also an upper gastrointestinal bleed.  He was restarted on anticoagulation eventually.  He also had an acute ileus, shock liver and acute kidney injury during his hospitalization.  He was discharged to inpatient rehab on 11/05/2018.  He has had a difficult time with physical therapy due to feeling very weak and tired.  He also has complained of nausea.  Today he complained of a cough with some brownish sputum, but denied shortness of breath.  A chest x-ray was obtained which showed white out of the right lung.  A CT of the chest was done which showed a large right pleural effusion with some air present consistent with an empyema.  Past Medical History:  Diagnosis Date  . Morbid obesity (HCC)     Past Surgical History:  Procedure Laterality Date  . ESOPHAGOGASTRODUODENOSCOPY (EGD) WITH PROPOFOL N/A 10/18/2018   Procedure: ESOPHAGOGASTRODUODENOSCOPY (EGD) WITH PROPOFOL;  Surgeon: Kerin Salen, MD;  Location: [facility] ENDOSCOPY;  Service: Gastroenterology;  Laterality: N/A;  . RIGHT/LEFT HEART CATH AND CORONARY ANGIOGRAPHY N/A 10/09/2018   Procedure: RIGHT/LEFT HEART CATH AND CORONARY ANGIOGRAPHY;  Surgeon: Iran Ouch, MD;  Location: [facility] INVASIVE CV LAB;  Service: Cardiovascular;  Laterality: N/A;    Family  History  Problem Relation Age of Onset  . Alcoholism Father   . Rheumatic fever Mother   . Diabetes Mother   . Drug abuse Brother     Social History:  reports that he has never smoked. He has quit using smokeless tobacco.  His smokeless tobacco use included snuff. He reports that he does not drink alcohol or use drugs.  Allergies: No Known Allergies  Medications:  Scheduled: . amiodarone  200 mg Oral BID  . carvedilol  3.125 mg Oral BID WC  . [START ON 11/13/2018] digoxin  0.125 mg Oral Daily  . [START ON 11/13/2018] furosemide  40 mg Oral Daily  . pantoprazole  40 mg Oral BID AC  . polyethylene glycol  17 g Oral BID  . rosuvastatin  20 mg Oral q1800    Results for orders placed or performed during the hospital encounter of 11/05/18 (from the past 48 hour(s))  Basic metabolic panel     Status: Abnormal   Collection Time: 11/11/18  4:59 AM  Result Value Ref Range   Sodium 138 135 - 145 mmol/L   Potassium 4.0 3.5 - 5.1 mmol/L   Chloride 98 98 - 111 mmol/L   CO2 32 22 - 32 mmol/L   Glucose, Bld 105 (H) 70 - 99 mg/dL   BUN 20 6 - 20 mg/dL   Creatinine, Ser 8.31 0.61 - 1.24 mg/dL   Calcium 8.1 (L) 8.9 - 10.3 mg/dL   GFR calc non Af Amer >60 >60 mL/min   GFR calc Af Amer >60 >60 mL/min   Anion gap 8 5 -  15    Comment: Performed at [facility], [address], [place], [place] [address]  CBC     Status: Abnormal   Collection Time: 11/11/18  4:59 AM  Result Value Ref Range   WBC 8.7 4.0 - 10.5 K/uL   RBC 2.53 (L) 4.22 - 5.81 MIL/uL   Hemoglobin 7.0 (L) 13.0 - 17.0 g/dL   HCT 60.4 (L) 54.0 - 98.1 %   MCV 92.9 80.0 - 100.0 fL   MCH 27.7 26.0 - 34.0 pg   MCHC 29.8 (L) 30.0 - 36.0 g/dL   RDW 19.1 (H) 47.8 - 29.5 %   Platelets 334 150 - 400 K/uL   nRBC 0.0 0.0 - 0.2 %    Comment: Performed at [facility], [address], [place], [place] [address]  CBC     Status: Abnormal   Collection Time: 11/12/18 10:47 AM  Result Value Ref Range   WBC 8.8 4.0 - 10.5 K/uL    RBC 2.57 (L) 4.22 - 5.81 MIL/uL   Hemoglobin 7.2 (L) 13.0 - 17.0 g/dL   HCT 86.5 (L) 78.4 - 69.6 %   MCV 93.0 80.0 - 100.0 fL   MCH 28.0 26.0 - 34.0 pg   MCHC 30.1 30.0 - 36.0 g/dL   RDW 29.5 (H) 28.4 - 13.2 %   Platelets 341 150 - 400 K/uL   nRBC 0.0 0.0 - 0.2 %    Comment: Performed at [facility], [address] [place], [place] [address]    Dg Chest 2 View  Addendum Date: 11/12/2018   ADDENDUM REPORT: 11/12/2018 10:01 ADDENDUM: Critical Value/emergent results were called by telephone at the time of interpretation on 11/12/2018 at 9:56 a.m. to Dr. Claudette Laws , who verbally acknowledged these results. Electronically Signed   By: Elberta Fortis M.D.   On: 11/12/2018 10:01   Result Date: 11/12/2018 CLINICAL DATA:  Recent cough.  CHF. EXAM: CHEST - 2 VIEW COMPARISON:  10/01/2018 FINDINGS: Left-sided PICC line has tip over the SVC at the level of the cavoatrial junction. Left lung is clear. There is near complete opacification of the right lung with air-fluid level over the upper lung/apex compatible with large hydro versus hemothorax. Moderate cardiomegaly unchanged. Remainder of the exam is unchanged. IMPRESSION: Large right hydro versus hemothorax. Left-sided PICC line unchanged. Electronically Signed: By: Elberta Fortis M.D. On: 11/12/2018 09:42   Ct Chest Wo Contrast  Result Date: 11/12/2018 CLINICAL DATA:  53 year old male with a history of pleural effusion EXAM: CT CHEST WITHOUT CONTRAST TECHNIQUE: Multidetector CT imaging of the chest was performed following the standard protocol without IV contrast. COMPARISON:  Chest x-ray 11/12/2018, 10/01/2018, abdomen CT 10/19/2018, abdomen CT 10/01/2018 FINDINGS: Cardiovascular: Cardiomegaly. Differential attenuation of the blood pool compatible with anemia. Advanced coronary calcifications of left main, left anterior descending, circumflex, right coronary artery. Normal course caliber and contour of the thoracic aorta. Diameter of the main  pulmonary artery unremarkable. Mediastinum/Nodes: Multiple mediastinal lymph nodes, likely reactive. Paratracheal node measures 13 mm. Lymph nodes of the posterior right mediastinum, including index node on image 92 of series 3 measuring 11 mm. Unremarkable course of the thoracic esophagus. Lungs/Pleura: Fluid and gas collection of the right pleural space contributes to complete volume loss/atelectasis of the right lower lobe, complete atelectasis of the posterior segment of the right upper lobe, complete atelectasis of the lateral segment of the middle low. Evidence of pleural thickening, though not completely characterized given the absence of contrast.  Necrotic changes of the right lower lobe which were present on the CT of 10/19/2018, incompletely characterized given the absence of contrast. Right-sided fluid measures 17 Hounsfield units. Mild paraseptal emphysema at the left lung apex. Atelectasis of the left lower lobe lateral segment. No left-sided pleural effusion. No confluent airspace disease. No edema. Upper Abdomen: No acute finding of the upper abdomen. Musculoskeletal: No acute displaced fracture. Degenerative changes of the spine. Left upper extremity PICC appears to terminate superior vena cava. IMPRESSION: Right-sided empyema, with complete collapse of the right lower lobe, and partial collapse of the right middle lobe and right upper lobe. Reactive mediastinal lymph nodes. These results were discussed by telephone at the time of interpretation on 11/12/2018 at 1:33 pm with Dr. Rinaldo Cloud LOVE. Cardiomegaly and evidence of anemia. Associated left main and 3 vessel coronary artery disease. Electronically Signed   By: Gilmer Mor D.O.   On: 11/12/2018 13:36  I personally reviewed the CT images and concur with the findings noted above  Review of Systems  Constitutional: Positive for malaise/fatigue. Negative for chills and fever.  Respiratory: Positive for cough (Productive brown sputum) and sputum  production. Negative for shortness of breath.   Cardiovascular: Negative for chest pain.  Gastrointestinal: Positive for nausea.  Neurological: Positive for dizziness and weakness. Negative for loss of consciousness.   Blood pressure 94/70, pulse (!) 104, temperature 98.3 F (36.8 C), temperature source Oral, SpO2 98 %. Physical Exam  Vitals reviewed. Constitutional: He is oriented to person, place, and time. No distress.  Chronically ill-appearing 53 year old man in no acute distress  HENT:  Head: Normocephalic and atraumatic.  Mouth/Throat: No oropharyngeal exudate.  Eyes: Conjunctivae and EOM are normal. No scleral icterus.  Cardiovascular:  Murmur heard. Irregular  Respiratory: No respiratory distress. He exhibits no tenderness.  Absent breath sounds right  GI: Soft. He exhibits no distension.  Musculoskeletal:        General: Edema present.  Lymphadenopathy:    He has no cervical adenopathy.  Neurological: He is alert and oriented to person, place, and time. No cranial nerve deficit.  Skin: Skin is warm and dry. He is not diaphoretic.    Assessment/Plan: Mr. 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  Today he complained of a cough.  A chest x-ray showed a white out of the right chest.  A CT of the chest shows a complex effusion with air present consistent with an empyema.    He has not had any fever noted and is afebrile after transfer back to the medical unit.  His white blood cell count is also normal.  Despite that I do think he should be started on broad-spectrum antibiotics for possible hospital-acquired pneumonia.  He has a large effusion, but is not in any respiratory distress.  He is a poor operative  candidate.  I think the best option in his case would be placement of a percutaneous drain and then intrapleural thrombolytics to try to drain the empyema.  I spoke with Dr.Gerghe of tried hospitalist.  He will arrange for a pigtail catheter placement by interventional radiology.  My service will manage the intrapleural thrombolytics.  Loreli Slot 11/12/2018, 2:42 PM

## 2018-11-12 NOTE — Procedures (Signed)
Interventional Radiology Procedure:   Indications: Right chest empyema  Procedure: Image guided right chest tube placement  Findings: 14 Fr drain placed in right pleural space with [place] and fluoro guidance.  Greater than 800 ml of thick brown purulent fluid removed.  Large amount of pleural fluid still remaining at end of procedure.  Drain attached to PleurEvac.  Complications: None     EBL: Minimal, less than 10 ml  Plan: Drain to wall suction at -20 cm.  Send fluid for culture.     Dilynn Munroe R. Lowella Dandy, MD  Pager: [phone]

## 2018-11-13 ENCOUNTER — Inpatient Hospital Stay (HOSPITAL_COMMUNITY): Payer: Medicaid Other

## 2018-11-13 LAB — CBC
HCT: 23.4 % — ABNORMAL LOW (ref 39.0–52.0)
Hemoglobin: 7.1 g/dL — ABNORMAL LOW (ref 13.0–17.0)
MCH: 27.6 pg (ref 26.0–34.0)
MCHC: 30.3 g/dL (ref 30.0–36.0)
MCV: 91.1 fL (ref 80.0–100.0)
Platelets: 330 10*3/uL (ref 150–400)
RBC: 2.57 MIL/uL — ABNORMAL LOW (ref 4.22–5.81)
RDW: 17.4 % — ABNORMAL HIGH (ref 11.5–15.5)
WBC: 8.7 10*3/uL (ref 4.0–10.5)
nRBC: 0 % (ref 0.0–0.2)

## 2018-11-13 LAB — EXPECTORATED SPUTUM ASSESSMENT W GRAM STAIN, RFLX TO RESP C

## 2018-11-13 LAB — COMPREHENSIVE METABOLIC PANEL
ALT: 98 U/L — ABNORMAL HIGH (ref 0–44)
AST: 79 U/L — ABNORMAL HIGH (ref 15–41)
Albumin: 1.4 g/dL — ABNORMAL LOW (ref 3.5–5.0)
Alkaline Phosphatase: 123 U/L (ref 38–126)
Anion gap: 8 (ref 5–15)
BUN: 21 mg/dL — ABNORMAL HIGH (ref 6–20)
CO2: 31 mmol/L (ref 22–32)
Calcium: 7.8 mg/dL — ABNORMAL LOW (ref 8.9–10.3)
Chloride: 99 mmol/L (ref 98–111)
Creatinine, Ser: 0.86 mg/dL (ref 0.61–1.24)
GFR calc Af Amer: >60 mL/min (ref >60)
GFR calc non Af Amer: >60 mL/min (ref >60)
Glucose, Bld: 123 mg/dL — ABNORMAL HIGH (ref 70–99)
Potassium: 3.7 mmol/L (ref 3.5–5.1)
Sodium: 138 mmol/L (ref 135–145)
Total Bilirubin: 0.7 mg/dL (ref 0.3–1.2)
Total Protein: 5.3 g/dL — ABNORMAL LOW (ref 6.5–8.1)

## 2018-11-13 LAB — EXPECTORATED SPUTUM ASSESSMENT W REFEX TO RESP CULTURE

## 2018-11-13 MED ORDER — SODIUM CHLORIDE (PF) 0.9 % IJ SOLN
10.0000 mg | Freq: Once | INTRAMUSCULAR | Status: AC
  Administered 2018-11-13: 10:00:00 10 mg via INTRAPLEURAL
  Filled 2018-11-13: qty 10

## 2018-11-13 MED ORDER — STERILE WATER FOR INJECTION IJ SOLN
5.0000 mg | Freq: Once | RESPIRATORY_TRACT | Status: AC
  Administered 2018-11-13: 5 mg via INTRAPLEURAL
  Filled 2018-11-13: qty 5

## 2018-11-13 MED ORDER — ZOLPIDEM TARTRATE 5 MG PO TABS
5.0000 mg | ORAL_TABLET | Freq: Once | ORAL | Status: AC
  Administered 2018-11-15: 5 mg via ORAL
  Filled 2018-11-13 (×2): qty 1

## 2018-11-13 NOTE — Progress Notes (Signed)
Chest tube unclamped 4h s/p cathflow alteplase/dornase alpha intrapleural injection by cardiothoracic PA.  To continuous suction at 20cc.  Tan, purulent drainage noted in tubing.

## 2018-11-13 NOTE — Progress Notes (Signed)
Lab called to notify that sputum sample that was sent down for this patient was not from the lungs and a new specimen would need to be collected.  Dr. Alanda Slim notified.

## 2018-11-13 NOTE — Progress Notes (Signed)
Called and updated wife.

## 2018-11-13 NOTE — Progress Notes (Signed)
PROGRESS NOTE  Aaron [name] MVH:[id number] DOB: 01-31-1966 DOA: 11/12/2018 PCP: [facility] [place]   LOS: 1 day   Brief Narrative / Interim history: 53 year old male with no significant past medical history until her recent prolonged hospitalization from 09/30/20/9/20 for new onset ischemic CHF (ICM), three-vessel CAD (not a candidate for CABG per CTS), new onset A. Fib, spontaneous right thigh hematoma and GI bleed.  His hospital course was complicated by a dynamic ileus/SBO requiring NG tube.  He was discharged to CIR, and returns with progressive weakness and dyspnea over the last 2 to 3 days.   He had CXR that was concerning for hydro-versus hemothorax for which he was admitted.  CT chest was obtained and concerning for empyema.  Cardiothoracic surgery consulted by admitting provider and recommended pigtail placement by IR for potential thrombolytics in few days.  He was also started on Vanco and Zosyn for possible HAP.  Patient had chest tube placed later the day of admission removal of 800 cc thick brown purulent fluid right away.  Subjective: No major events overnight of this morning.  Has no complaints this morning.  Feels well resting.  Denies dyspnea, chest pain or abdominal pain.  Chest tube drained about 2 L overnight.  Hemoglobin stable at 7.1.  Assessment & Plan: Active Problems:   Acute systolic CHF (congestive heart failure) (HCC)   Acute hypoxemic respiratory failure (HCC)   Morbid obesity (HCC)   Atrial fibrillation with rapid ventricular response (HCC)   Hypokalemia   Protein-calorie malnutrition, severe   Hematoma of right thigh   Acute blood loss anemia   Orthostatic hypotension   Empyema (HCC)  Dyspnea/fatigue-likely due to pleural effusion/empyema/possible HAP -Status post chest tube placement by IR with about 3 L purulent material draiange -No fluid cytology.  Cultures pending. -Vancomycin and Zosyn for HAP-4/16-- -saturating in upper 90s  to 100 on room air this morning. -IR and CTS on board. -CTS planning thrombolytics  Chronic systolic CHF/ICM: Echo with EF of 13%.  Appears euvolemic. -Continue home Lasix -ARB on hold due to intermittent hypotension. -Daily weight, intake output and renal function.  History of three-vessel CAD: Not a candidate for CABG per CTS -Continue home medications (low-dose Coreg and Crestor)  History of blood loss anemia/GI bleed: Hemoglobin stable at 7.1. -We will transfuse if below 7. -Continue home PPI  Paroxysmal A. fib without RVR -Continue home Eliquis, amiodarone, digoxin and Coreg  Elevated liver enzymes: No history of liver disease.  CT on 3/23 did not show any hepatobiliary abnormalities. He is on amiodarone and Crestor which could contribute. -Continue monitoring. -We will continue amiodarone and Crestor as liver enzymes seems to be stable  Other chronic medical conditions stable.  Scheduled Meds: . amiodarone  200 mg Oral BID  . carvedilol  3.125 mg Oral BID WC  . digoxin  0.125 mg Oral Daily  . pulmozyme (DORNASE) for intrapleural administration  5 mg Intrapleural Once  . furosemide  40 mg Oral Daily  . pantoprazole  40 mg Oral BID AC  . polyethylene glycol  17 g Oral BID  . rosuvastatin  20 mg Oral q1800  . zolpidem  5 mg Oral Once   Continuous Infusions: . piperacillin-tazobactam (ZOSYN)  IV 3.375 g (11/13/18 1055)  . vancomycin 1,750 mg (11/13/18 0634)   PRN Meds:.acetaminophen, alum & mag hydroxide-simeth, lidocaine  DVT prophylaxis: On Eliquis for atrial fibrillation Code Status: Full code Family Communication: None at bedside Disposition Plan: Remains inpatient  Consultants:  CTS  IR  Procedures:   Chest tube placement on 4/16  Antimicrobials:  Vancomycin 4/16--  Objective: Vitals:   11/12/18 1946 11/12/18 2332 11/13/18 0325 11/13/18 0834  BP: (!) 101/58 99/60 96/61  98/67  Pulse: 94 62 74 76  Resp: (!) 30 (!) 26 17 (!) 22  Temp: 98.1 F  (36.7 C) 98.3 F (36.8 C) 97.8 F (36.6 C) 97.7 F (36.5 C)  TempSrc: Oral Oral Oral Oral  SpO2: 96% 96% 97% 94%    Intake/Output Summary (Last 24 hours) at 11/13/2018 1204 Last data filed at 11/13/2018 0900 Gross per 24 hour  Intake 1531.03 ml  Output 2715 ml  Net -1183.97 ml   There were no vitals filed for this visit.  Examination:  GENERAL: Appears well. No acute distress.  EYES - vision grossly intact. Sclera anicteric.  Conjunctiva pale. NOSE- no gross deformity or drainage MOUTH - no oral lesions noted THROAT- no swelling or erythema LUNGS:  No IWOB.  Diminished aeration on the right. HEART:  IR. Normal rate. Heart sounds normal. ABD: Bowel sounds present. Soft. Non tender.  MSK/EXT: Moves extremities. No obvious deformity. SKIN: no apparent skin lesion.  NEURO: Awake, alert and oriented appropriately.  No gross deficit.  PSYCH: Calm. Normal affect.   Data Reviewed: I have independently reviewed following labs and imaging studies  CBC: Recent Labs  Lab 11/08/18 0904 11/11/18 0459 11/12/18 1047 11/13/18 0320  WBC 7.6 8.7 8.8 8.7  HGB 7.1* 7.0* 7.2* 7.1*  HCT 23.5* 23.5* 23.9* 23.4*  MCV 91.8 92.9 93.0 91.1  PLT 306 334 341 330   Basic Metabolic Panel: Recent Labs  Lab 11/11/18 0459 11/13/18 0320  NA 138 138  K 4.0 3.7  CL 98 99  CO2 32 31  GLUCOSE 105* 123*  BUN 20 21*  CREATININE 0.83 0.86  CALCIUM 8.1* 7.8*   GFR: Estimated Creatinine Clearance: 145.6 mL/min (by C-G formula based on SCr of 0.86 mg/dL). Liver Function Tests: Recent Labs  Lab 11/13/18 0320  AST 79*  ALT 98*  ALKPHOS 123  BILITOT 0.7  PROT 5.3*  ALBUMIN 1.4*   No results for input(s): LIPASE, AMYLASE in the last 168 hours. No results for input(s): AMMONIA in the last 168 hours. Coagulation Profile: Recent Labs  Lab 11/12/18 1826  INR 1.2   Cardiac Enzymes: No results for input(s): CKTOTAL, CKMB, CKMBINDEX, TROPONINI in the last 168 hours. BNP (last 3 results) No  results for input(s): PROBNP in the last 8760 hours. HbA1C: No results for input(s): HGBA1C in the last 72 hours. CBG: No results for input(s): GLUCAP in the last 168 hours. Lipid Profile: No results for input(s): CHOL, HDL, LDLCALC, TRIG, CHOLHDL, LDLDIRECT in the last 72 hours. Thyroid Function Tests: No results for input(s): TSH, T4TOTAL, FREET4, T3FREE, THYROIDAB in the last 72 hours. Anemia Panel: No results for input(s): VITAMINB12, FOLATE, FERRITIN, TIBC, IRON, RETICCTPCT in the last 72 hours. Urine analysis:    Component Value Date/Time   COLORURINE AMBER (A) 10/01/2018 1523   APPEARANCEUR HAZY (A) 10/01/2018 1523   LABSPEC 1.023 10/01/2018 1523   PHURINE 5.0 10/01/2018 1523   GLUCOSEU NEGATIVE 10/01/2018 1523   HGBUR SMALL (A) 10/01/2018 1523   BILIRUBINUR NEGATIVE 10/01/2018 1523   KETONESUR NEGATIVE 10/01/2018 1523   PROTEINUR NEGATIVE 10/01/2018 1523   NITRITE NEGATIVE 10/01/2018 1523   LEUKOCYTESUR NEGATIVE 10/01/2018 1523   Sepsis Labs: Invalid input(s): PROCALCITONIN, LACTICIDVEN  Recent Results (from the past 240 hour(s))  Culture, blood (Routine X 2)  w Reflex to ID Panel     Status: None (Preliminary result)   Collection Time: 11/12/18  2:59 PM  Result Value Ref Range Status   Specimen Description BLOOD RIGHT ARM  Final   Special Requests   Final    BOTTLES DRAWN AEROBIC ONLY Blood Culture adequate volume   Culture   Final    NO GROWTH < 24 HOURS Performed at [facility], [address] [place], [place] [address]    Report Status PENDING  Incomplete  Culture, blood (Routine X 2) w Reflex to ID Panel     Status: None (Preliminary result)   Collection Time: 11/12/18  3:05 PM  Result Value Ref Range Status   Specimen Description BLOOD LEFT HAND  Final   Special Requests   Final    BOTTLES DRAWN AEROBIC ONLY Blood Culture adequate volume   Culture   Final    NO GROWTH < 24 HOURS Performed at [facility], [address], [place], [place]  [address]    Report Status PENDING  Incomplete  Aerobic/Anaerobic Culture (surgical/deep wound)     Status: None (Preliminary result)   Collection Time: 11/12/18  6:02 PM  Result Value Ref Range Status   Specimen Description PLEURAL  Final   Special Requests RIGHT  Final   Gram Stain   Final    MODERATE WBC PRESENT, PREDOMINANTLY PMN RARE GRAM NEGATIVE RODS Performed at [facility], [address], [place], [place] [address]    Culture PENDING  Incomplete   Report Status PENDING  Incomplete  Expectorated sputum assessment w rflx to resp cult     Status: None   Collection Time: 11/12/18  8:24 PM  Result Value Ref Range Status   Specimen Description EXPECTORATED SPUTUM  Final   Special Requests NONE  Final   Sputum evaluation   Final    Sputum specimen not acceptable for testing.  Please recollect.   RESULT CALLED TO, READ BACK BY AND VERIFIED WITH: DAVIS AT 0736 ON [id number] BY SJW Performed at [facility], [address] [place], [place] [address]    Report Status 11/13/2018 FINAL  Final      Radiology Studies: Ct Chest Wo Contrast  Result Date: 11/12/2018 CLINICAL DATA:  53 year old male with a history of pleural effusion EXAM: CT CHEST WITHOUT CONTRAST TECHNIQUE: Multidetector CT imaging of the chest was performed following the standard protocol without IV contrast. COMPARISON:  Chest x-ray 11/12/2018, 10/01/2018, abdomen CT 10/19/2018, abdomen CT 10/01/2018 FINDINGS: Cardiovascular: Cardiomegaly. Differential attenuation of the blood pool compatible with anemia. Advanced coronary calcifications of left main, left anterior descending, circumflex, right coronary artery. Normal course caliber and contour of the thoracic aorta. Diameter of the main pulmonary artery unremarkable. Mediastinum/Nodes: Multiple mediastinal lymph nodes, likely reactive. Paratracheal node measures 13 mm. Lymph nodes of the posterior right mediastinum, including index node on image 92 of series 3  measuring 11 mm. Unremarkable course of the thoracic esophagus. Lungs/Pleura: Fluid and gas collection of the right pleural space contributes to complete volume loss/atelectasis of the right lower lobe, complete atelectasis of the posterior segment of the right upper lobe, complete atelectasis of the lateral segment of the middle low. Evidence of pleural thickening, though not completely characterized given the absence of contrast. Necrotic changes of the right lower lobe which were present on the CT of 10/19/2018, incompletely characterized given the absence of contrast. Right-sided fluid measures 17 Hounsfield units. Mild paraseptal emphysema at the  left lung apex. Atelectasis of the left lower lobe lateral segment. No left-sided pleural effusion. No confluent airspace disease. No edema. Upper Abdomen: No acute finding of the upper abdomen. Musculoskeletal: No acute displaced fracture. Degenerative changes of the spine. Left upper extremity PICC appears to terminate superior vena cava. IMPRESSION: Right-sided empyema, with complete collapse of the right lower lobe, and partial collapse of the right middle lobe and right upper lobe. Reactive mediastinal lymph nodes. These results were discussed by telephone at the time of interpretation on 11/12/2018 at 1:33 pm with Dr. Rinaldo Cloud LOVE. Cardiomegaly and evidence of anemia. Associated left main and 3 vessel coronary artery disease. Electronically Signed   By: Gilmer Mor D.O.   On: 11/12/2018 13:36   Dg Chest Port 1 View  Result Date: 11/13/2018 CLINICAL DATA:  Shortness of breath, empyema. EXAM: PORTABLE CHEST 1 VIEW COMPARISON:  Radiographs of November 12, 2018. FINDINGS: Stable cardiomegaly. Left-sided PICC line is unchanged in position. Interval placement of pigtail drainage catheter into right pleural space. Right pleural effusion noted on prior exam is significantly decreased in size compared to prior exam. No definite pneumothorax is noted. Left lung is  unremarkable. Bony thorax is unremarkable. IMPRESSION: Interval placement of pigtail drainage catheter right pleural space. Right pleural effusion noted on prior exam is significantly decreased in size compared to prior exam. Electronically Signed   By: Lupita Raider M.D.   On: 11/13/2018 09:25   Ir Perc Pleural Drain W/indwell Cath W/img Guide  Result Date: 11/12/2018 INDICATION: 52 year old with large right pleural effusion containing gas. Findings are compatible with empyema. Plan for placement of image guided chest tube. EXAM: PLACEMENT OF RIGHT CHEST TUBE WITH ULTRASOUND AND FLUOROSCOPIC GUIDANCE. MEDICATIONS: No antibiotics given for this procedure. ANESTHESIA/SEDATION: 25 mcg fentanyl COMPLICATIONS: None immediate. PROCEDURE: Informed written consent was obtained from the patient after a thorough discussion of the procedural risks, benefits and alternatives. All questions were addressed. Maximal Sterile Barrier Technique was utilized including caps, mask, sterile gowns, sterile gloves, sterile drape, hand hygiene and skin antiseptic. A timeout was performed prior to the initiation of the procedure. Patient was placed supine on the interventional table. Right mid axillary region was prepped and draped in sterile fashion. Ultrasound demonstrated a large amount of complex right pleural fluid. Skin was anesthetized with 1% lidocaine. Yueh catheter was directed into the pleural space with ultrasound guidance. Thick brown/yellow purulent fluid was aspirated. Stiff Amplatz wire was advanced into the pleural space and the tract was dilated to accommodate a 14 [place] multipurpose drain. Drain was easily advanced into the pleural space. Greater than 800 mL of thick purulent fluid was removed. Specimen sent for culture. Catheter was attached to a PleurEvac. FINDINGS: Large complex right pleural effusion. Thick purulent fluid was removed and compatible with an empyema. IMPRESSION: Successful placement of an image  guided right chest tube. Electronically Signed   By: Richarda Overlie M.D.   On: 11/12/2018 18:03     T. [facility] Pager [phone]  If 7PM-7AM, please contact night-coverage [email or web address] Password [facility] 11/13/2018, 12:04 PM

## 2018-11-13 NOTE — Progress Notes (Signed)
  Subjective: Mild discomfort at CT site Denies shortness of breath  Objective: Vital signs in last 24 hours: Temp:  [97.7 F (36.5 C)-98.4 F (36.9 C)] 97.7 F (36.5 C) (04/17 0834) Pulse Rate:  [62-104] 76 (04/17 0834) Cardiac Rhythm: Atrial fibrillation (04/17 0700) Resp:  [13-30] 22 (04/17 0834) BP: (91-118)/(57-72) 98/67 (04/17 0834) SpO2:  [93 %-100 %] 94 % (04/17 0834)  Hemodynamic parameters for last 24 hours:    Intake/Output from previous day: 04/16 0701 - 04/17 0700 In: 1161 [P.O.:587; IV Piggyback:574] Out: 2715 [Urine:650; Chest Tube:2065] Intake/Output this shift: No intake/output data recorded.  General appearance: alert, cooperative and no distress Neurologic: intact Heart: irregularly irregular rhythm Lungs: diminished breath sounds on right but greatly improved  Lab Results: Recent Labs    11/12/18 1047 11/13/18 0320  WBC 8.8 8.7  HGB 7.2* 7.1*  HCT 23.9* 23.4*  PLT 341 330   BMET:  Recent Labs    11/11/18 0459 11/13/18 0320  NA 138 138  K 4.0 3.7  CL 98 99  CO2 32 31  GLUCOSE 105* 123*  BUN 20 21*  CREATININE 0.83 0.86  CALCIUM 8.1* 7.8*    PT/INR:  Recent Labs    11/12/18 1826  LABPROT 15.1  INR 1.2   ABG    Component Value Date/Time   PHART 7.461 (H) 10/09/2018 1047   HCO3 31.3 (H) 10/09/2018 1051   TCO2 33 (H) 10/09/2018 1051   O2SAT 76.7 10/26/2018 0410   CBG (last 3)  No results for input(s): GLUCAP in the last 72 hours.  Assessment/Plan: S/P  - Empyema of right pleural space  Looks good this AM,. Remains afebrile.  Pleural catheter placed yesterday with excellent result. Drained 800 initially then another 2 L overnight. CXR is dramatically improved but still has a rim around the lung- some of that may be pleural thickening but I think it is worth trying thrombolytics to see if we can get any additional drainage  Continue broad spectrum antibiotics   LOS: 1 day    Loreli Slot 11/13/2018

## 2018-11-14 ENCOUNTER — Inpatient Hospital Stay (HOSPITAL_COMMUNITY): Payer: Medicaid Other

## 2018-11-14 LAB — CBC
HCT: 24.8 % — ABNORMAL LOW (ref 39.0–52.0)
Hemoglobin: 7.7 g/dL — ABNORMAL LOW (ref 13.0–17.0)
MCH: 28.1 pg (ref 26.0–34.0)
MCHC: 31 g/dL (ref 30.0–36.0)
MCV: 90.5 fL (ref 80.0–100.0)
Platelets: 360 10*3/uL (ref 150–400)
RBC: 2.74 MIL/uL — ABNORMAL LOW (ref 4.22–5.81)
RDW: 17.6 % — ABNORMAL HIGH (ref 11.5–15.5)
WBC: 9.3 10*3/uL (ref 4.0–10.5)
nRBC: 0.2 % (ref 0.0–0.2)

## 2018-11-14 LAB — COMPREHENSIVE METABOLIC PANEL
ALT: 88 U/L — ABNORMAL HIGH (ref 0–44)
AST: 53 U/L — ABNORMAL HIGH (ref 15–41)
Albumin: 1.4 g/dL — ABNORMAL LOW (ref 3.5–5.0)
Alkaline Phosphatase: 118 U/L (ref 38–126)
Anion gap: 9 (ref 5–15)
BUN: 18 mg/dL (ref 6–20)
CO2: 32 mmol/L (ref 22–32)
Calcium: 8.1 mg/dL — ABNORMAL LOW (ref 8.9–10.3)
Chloride: 98 mmol/L (ref 98–111)
Creatinine, Ser: 0.82 mg/dL (ref 0.61–1.24)
GFR calc Af Amer: >60 mL/min (ref >60)
GFR calc non Af Amer: >60 mL/min (ref >60)
Glucose, Bld: 102 mg/dL — ABNORMAL HIGH (ref 70–99)
Potassium: 4.1 mmol/L (ref 3.5–5.1)
Sodium: 139 mmol/L (ref 135–145)
Total Bilirubin: 1 mg/dL (ref 0.3–1.2)
Total Protein: 5.8 g/dL — ABNORMAL LOW (ref 6.5–8.1)

## 2018-11-14 LAB — MAGNESIUM: Magnesium: 2.2 mg/dL (ref 1.7–2.4)

## 2018-11-14 MED ORDER — ENSURE ENLIVE PO LIQD
237.0000 mL | Freq: Three times a day (TID) | ORAL | Status: DC
  Administered 2018-11-14 – 2018-11-25 (×28): 237 mL via ORAL

## 2018-11-14 MED ORDER — SODIUM CHLORIDE 0.9% FLUSH: 10.0000 mL | INTRAVENOUS | Status: DC | PRN

## 2018-11-14 MED ORDER — APIXABAN 5 MG PO TABS
5.0000 mg | ORAL_TABLET | Freq: Two times a day (BID) | ORAL | Status: DC
  Administered 2018-11-14 – 2018-11-25 (×23): 5 mg via ORAL
  Filled 2018-11-14 (×23): qty 1

## 2018-11-14 NOTE — Progress Notes (Signed)
Initial Nutrition Assessment  DOCUMENTATION CODES:  Obesity unspecified  INTERVENTION:  Ensure Enlive po TID, each supplement provides 350 kcal and 20 grams of protein  Pt eating well. Low albumin felt more related to his recent month long critical illness/shock liver. Levels should gladually recuperate as he recovers.  NUTRITION DIAGNOSIS:  Increased nutrient needs related to recovery from prolonged acute illness as evidenced by estimated nutritional requirements for this outcome  GOAL:  Patient will meet greater than or equal to 90% of their needs  MONITOR:  PO intake, Supplement acceptance, Diet advancement, Weight trends, I & O's  REASON FOR ASSESSMENT:  Consult Assessment of nutrition requirement/status  ASSESSMENT:  53 y/o male who was recently hospitalized 3/5-4/9 with new onset HF, 3-vessel CAD. Course c/b new onset Afib, Spontaneous R thigh hematoma, and ileus. Discharged to CIR 4/9. Now represents to hospital d/t progressive weakness/SOB x2-3 D. CXR concerning for empyema. Admitted for management.    4/16-S/p pigtail placement w/ immediate 800cc output. Put out another 2L overnight  RD called and spoke with patient. He reports he is eating well. He says he has eaten 100% of his most recent meals. He does share his appetite is still not back to baseline. He says "I just got back to eating not to long ago", as such he has preferring lighter items such as cold cereal and fruit. He does not care much for the taste of the hospital food. He was drinking 3 ensures/day in CIR. He is agreeable to restarting these. Reviewing patients intake records, he has been eating well recently. The past week he has eaten 100% of nearly all documented meals.   Pts most recent wt is 290.5 lbs. When first presented to hospital last month he was 333 lbs, however he was severely fluid overloaded at that time d/t acute HF-"I had a hundred lbs of fluid on my legs. . Pt says he does not know his UBW, he  really never weighed himself- "this was the first time I was sick in my life"  Low albumin felt more related to his recent month long critical illness/shock liver. Levels should gladually recuperate as he recovers.  Labs: Albumin: 1.4, Hgb:7.7, Glu:102 Meds: Miralax, ppi, IVF, IV abx,   Recent Labs  Lab 11/11/18 0459 11/13/18 0320 11/14/18 0600  NA 138 138 139  K 4.0 3.7 4.1  CL 98 99 98  CO2 32 31 32  BUN 20 21* 18  CREATININE 0.83 0.86 0.82  CALCIUM 8.1* 7.8* 8.1*  MG  --   --  2.2  GLUCOSE 105* 123* 102*   NUTRITION - FOCUSED PHYSICAL EXAM: Unable to conduct  Diet Order:   Diet Order            Diet Heart Room service appropriate? Yes; Fluid consistency: Thin  Diet effective now             EDUCATION NEEDS:  No education needs have been identified at this time  Skin:  MASD to groin, buttocks PU stage II to R, medial nare (from ngt) PU stage II to R buttocks  Last BM:  4/17  Height:  Ht Readings from Last 1 Encounters:  10/18/18 6\' 4"  (1.93 m)   Weight:  Wt Readings from Last 1 Encounters:  11/14/18 131.8 kg   Wt Readings from Last 10 Encounters:  11/14/18 131.8 kg  11/12/18 131.8 kg  11/05/18 134.3 kg   Ideal Body Weight:  91.82 kg  BMI:  Body mass index is 35.37  kg/m.  Estimated Nutritional Needs:  Kcal:  2100-2350 kcals (16-18 kcal/kg bw) Protein:  110-130g (1.2-1.4g/kg) Fluid:  Per MD discretion   Christophe Louis RD, LDN, CNSC Clinical Nutrition Available Tues-Sat via Pager: [phone] 11/14/2018 6:17 PM

## 2018-11-14 NOTE — Progress Notes (Addendum)
PROGRESS NOTE  Aaron [name] ZOX:[id number] DOB: April 28, 1966 DOA: 11/12/2018 PCP: [facility] [place]   LOS: 2 days   Brief Narrative / Interim history: 53 year old male with no significant past medical history until her recent prolonged hospitalization from 09/30/20/9/20 for new onset ischemic CHF (ICM), three-vessel CAD (not a candidate for CABG per CTS), new onset A. Fib, spontaneous right thigh hematoma and GI bleed.  His hospital course was complicated by a dynamic ileus/SBO requiring NG tube.  He was discharged to CIR, and returns with progressive weakness and dyspnea over the last 2 to 3 days.   He had CXR that was concerning for hydro-versus hemothorax for which he was admitted.  CT chest was obtained and concerning for empyema.  Cardiothoracic surgery consulted by admitting provider and recommended pigtail placement by IR for potential thrombolytics in few days.  He was also started on Vanco and Zosyn for possible HAP.  Patient had chest tube placed later the day of admission removal of 800 cc thick brown purulent fluid right away.  Subjective: No major events overnight of this morning.  Has no complaints this morning.  Feels well.  Denies dyspnea, chest pain or abdominal pain.  Getting Cathflo alteplase/dornase alpha intrapleural injection by cardiothoracic surgery.  Continues to drain tan purulent drainage.  Remains afebrile.  No leukocytosis.  Blood cultures negative.  Fluid cultures pending. Assessment & Plan: Active Problems:   Acute systolic CHF (congestive heart failure) (HCC)   Acute hypoxemic respiratory failure (HCC)   Morbid obesity (HCC)   Atrial fibrillation with rapid ventricular response (HCC)   Hypokalemia   Protein-calorie malnutrition, severe   Hematoma of right thigh   Acute blood loss anemia   Orthostatic hypotension   Empyema (HCC)  Dyspnea/fatigue-likely due to pleural effusion/empyema/possible HAP-improving. -Chest tube placement by IR  on 4/16.  820 cc in the last 24-hour.  About 3 to 4 L so far.  -Cathflo alteplase/dornase alpha intrapleural injection by cardiothoracic surgery. -No fluid cytology or chemistry sent.  Cultures pending. -Vancomycin and Zosyn for HAP-4/16--. -No fever or leukocytosis.  Blood cultures negative. -IR and CTS on board. -CTS planning thrombolytics  Chronic systolic CHF/ICM: Echo with EF of 13%.  Appears euvolemic. -Continue home Lasix -ARB on hold due to intermittent hypotension. -Daily weight, intake output and renal function.  History of three-vessel CAD: Not a candidate for CABG per CTS-stable. -Continue home medications (low-dose Coreg and Crestor)  History of blood loss anemia/GI bleed: Hemoglobin from 7.1-7.7 -We will transfuse if below 7. -Continue home PPI  Paroxysmal A. fib without RVR -Continue home Eliquis, amiodarone, digoxin and Coreg  Elevated liver enzymes: No history of liver disease.  CT on 3/23 did not show any hepatobiliary abnormalities. He is on amiodarone and Crestor which could contribute.  Stable. -Continue monitoring. -We will continue amiodarone and Crestor as liver enzymes seems to be stable.  Benefit outweighs the risk.  Protein calorie malnutrition: BMI 36.  Due to chronic illness. -Consult dietitian.  Other chronic medical conditions stable.  Scheduled Meds: . amiodarone  200 mg Oral BID  . apixaban  5 mg Oral BID  . carvedilol  3.125 mg Oral BID WC  . digoxin  0.125 mg Oral Daily  . furosemide  40 mg Oral Daily  . pantoprazole  40 mg Oral BID AC  . polyethylene glycol  17 g Oral BID  . rosuvastatin  20 mg Oral q1800  . zolpidem  5 mg Oral Once   Continuous Infusions: .  piperacillin-tazobactam (ZOSYN)  IV 3.375 g (11/14/18 0212)  . vancomycin 1,750 mg (11/14/18 0559)   PRN Meds:.acetaminophen, alum & mag hydroxide-simeth, lidocaine  DVT prophylaxis: On Eliquis for atrial fibrillation Code Status: Full code Family Communication: None at  bedside.  Updated patient's wife on 4/17. Disposition Plan: Remains inpatient.  Still with chest tube draining.  On IV vancomycin and Zosyn.  Fluid culture pending.  Consultants:   CTS  IR  Procedures:   Chest tube placement on 4/16  Antimicrobials:  Vancomycin 4/16--  Zosyn 4/16--  Objective: Vitals:   11/13/18 2009 11/13/18 2341 11/14/18 0433 11/14/18 0813  BP: 111/65 93/61 98/70  97/63  Pulse:  66  (!) 58  Resp:  17  (!) 25  Temp: 98.3 F (36.8 C) 97.6 F (36.4 C) 98 F (36.7 C) 97.6 F (36.4 C)  TempSrc: Oral Oral Oral Oral  SpO2:  94%  97%    Intake/Output Summary (Last 24 hours) at 11/14/2018 1031 Last data filed at 11/14/2018 3736 Gross per 24 hour  Intake -  Output 1920 ml  Net -1920 ml   There were no vitals filed for this visit.  Examination: GENERAL: Appears chronically ill.  No acute distress. HEENT: MMM.  Vision and Hearing grossly intact.  Conjunctiva pale. NECK: Supple.  No JVD.  LUNGS:  No IWOB.  Diminished aeration on the right side.  Chest tube in right chest. HEART: IR. Heart sounds normal.  ABD: Bowel sounds present. Soft. Non tender.  No hepatomegaly EXT: Unna boot over both lower extremities SKIN: no apparent skin lesion.  Pale. NEURO: Awake, alert and oriented appropriately.  No gross deficit.  PSYCH: Calm. Normal affect. Data Reviewed: I have independently reviewed following labs and imaging studies  CBC: Recent Labs  Lab 11/08/18 0904 11/11/18 0459 11/12/18 1047 11/13/18 0320 11/14/18 0600  WBC 7.6 8.7 8.8 8.7 9.3  HGB 7.1* 7.0* 7.2* 7.1* 7.7*  HCT 23.5* 23.5* 23.9* 23.4* 24.8*  MCV 91.8 92.9 93.0 91.1 90.5  PLT 306 334 341 330 360   Basic Metabolic Panel: Recent Labs  Lab 11/11/18 0459 11/13/18 0320 11/14/18 0600  NA 138 138 139  K 4.0 3.7 4.1  CL 98 99 98  CO2 32 31 32  GLUCOSE 105* 123* 102*  BUN 20 21* 18  CREATININE 0.83 0.86 0.82  CALCIUM 8.1* 7.8* 8.1*  MG  --   --  2.2   GFR: Estimated Creatinine  Clearance: 152.7 mL/min (by C-G formula based on SCr of 0.82 mg/dL). Liver Function Tests: Recent Labs  Lab 11/13/18 0320 11/14/18 0600  AST 79* 53*  ALT 98* 88*  ALKPHOS 123 118  BILITOT 0.7 1.0  PROT 5.3* 5.8*  ALBUMIN 1.4* 1.4*   No results for input(s): LIPASE, AMYLASE in the last 168 hours. No results for input(s): AMMONIA in the last 168 hours. Coagulation Profile: Recent Labs  Lab 11/12/18 1826  INR 1.2   Cardiac Enzymes: No results for input(s): CKTOTAL, CKMB, CKMBINDEX, TROPONINI in the last 168 hours. BNP (last 3 results) No results for input(s): PROBNP in the last 8760 hours. HbA1C: No results for input(s): HGBA1C in the last 72 hours. CBG: No results for input(s): GLUCAP in the last 168 hours. Lipid Profile: No results for input(s): CHOL, HDL, LDLCALC, TRIG, CHOLHDL, LDLDIRECT in the last 72 hours. Thyroid Function Tests: No results for input(s): TSH, T4TOTAL, FREET4, T3FREE, THYROIDAB in the last 72 hours. Anemia Panel: No results for input(s): VITAMINB12, FOLATE, FERRITIN, TIBC, IRON, RETICCTPCT in the  last 72 hours. Urine analysis:    Component Value Date/Time   COLORURINE AMBER (A) 10/01/2018 1523   APPEARANCEUR HAZY (A) 10/01/2018 1523   LABSPEC 1.023 10/01/2018 1523   PHURINE 5.0 10/01/2018 1523   GLUCOSEU NEGATIVE 10/01/2018 1523   HGBUR SMALL (A) 10/01/2018 1523   BILIRUBINUR NEGATIVE 10/01/2018 1523   KETONESUR NEGATIVE 10/01/2018 1523   PROTEINUR NEGATIVE 10/01/2018 1523   NITRITE NEGATIVE 10/01/2018 1523   LEUKOCYTESUR NEGATIVE 10/01/2018 1523   Sepsis Labs: Invalid input(s): PROCALCITONIN, LACTICIDVEN  Recent Results (from the past 240 hour(s))  Culture, blood (Routine X 2) w Reflex to ID Panel     Status: None (Preliminary result)   Collection Time: 11/12/18  2:59 PM  Result Value Ref Range Status   Specimen Description BLOOD RIGHT ARM  Final   Special Requests   Final    BOTTLES DRAWN AEROBIC ONLY Blood Culture adequate volume    Culture   Final    NO GROWTH 2 DAYS Performed at [facility], [address], [place], [place] [address]    Report Status PENDING  Incomplete  Culture, blood (Routine X 2) w Reflex to ID Panel     Status: None (Preliminary result)   Collection Time: 11/12/18  3:05 PM  Result Value Ref Range Status   Specimen Description BLOOD LEFT HAND  Final   Special Requests   Final    BOTTLES DRAWN AEROBIC ONLY Blood Culture adequate volume   Culture   Final    NO GROWTH 2 DAYS Performed at [facility], [address], [place], [place] [address]    Report Status PENDING  Incomplete  Aerobic/Anaerobic Culture (surgical/deep wound)     Status: None (Preliminary result)   Collection Time: 11/12/18  6:02 PM  Result Value Ref Range Status   Specimen Description PLEURAL  Final   Special Requests RIGHT  Final   Gram Stain   Final    MODERATE WBC PRESENT, PREDOMINANTLY PMN RARE GRAM NEGATIVE RODS Performed at [facility], [address] [place], [place] [address]    Culture PENDING  Incomplete   Report Status PENDING  Incomplete  Expectorated sputum assessment w rflx to resp cult     Status: None   Collection Time: 11/12/18  8:24 PM  Result Value Ref Range Status   Specimen Description EXPECTORATED SPUTUM  Final   Special Requests NONE  Final   Sputum evaluation   Final    Sputum specimen not acceptable for testing.  Please recollect.   RESULT CALLED TO, READ BACK BY AND VERIFIED WITH: DAVIS AT 0736 ON [id number] BY SJW Performed at [facility], [address] [place], [place] [address]    Report Status 11/13/2018 FINAL  Final      Radiology Studies: Dg Chest Port 1 View  Result Date: 11/14/2018 CLINICAL DATA:  53 year old male with right-sided chest tube in place and a history of right empyema EXAM: PORTABLE CHEST 1 VIEW COMPARISON:  Prior chest x-ray 11/13/2018 FINDINGS: Right-sided pigtail thoracostomy tube remains in unchanged position. A left upper extremity  PICC is present. The tip is in good position overlying the superior cavoatrial junction. Stable cardiomegaly. Mediastinal contours remain unchanged. Similar degree of left basilar airspace opacity favored to reflect atelectasis. Right lung hypoventilation with pleural thickening versus residual pleural fluid appears unchanged. No significant interval change in patchy airspace opacities in the right lower lobe likely representing persistent right lower lobe atelectasis. IMPRESSION: 1. No significant  interval change in the appearance of the chest over the last 24 hours. 2. Stable and satisfactory support apparatus. 3. Persistent right-sided pleural thickening and right lower lobe atelectasis. 4. Persistent nonspecific left basilar airspace opacity also favored to reflect atelectasis. Electronically Signed   By: Malachy MoanHeath  McCullough M.D.   On: 11/14/2018 08:24    Taye T. [facility] Pager [phone]  If 7PM-7AM, please contact night-coverage [email or web address] Password [email or web address] 11/14/2018, 10:31 AM

## 2018-11-14 NOTE — Progress Notes (Addendum)
      [address]       [name] [id number]             [phone]           Subjective: Patient in good spirits this am. He has some discomfort at right pigtail tube site.  Objective: Vital signs in last 24 hours: Temp:  [97.6 F (36.4 C)-98.3 F (36.8 C)] 98 F (36.7 C) (04/18 0433) Pulse Rate:  [66-82] 66 (04/17 2341) Cardiac Rhythm: Atrial fibrillation;Bundle branch block (04/17 1926) Resp:  [17-25] 17 (04/17 2341) BP: (93-111)/(55-70) 98/70 (04/18 0433) SpO2:  [90 %-98 %] 94 % (04/17 2341)     Intake/Output from previous day: 04/17 0701 - 04/18 0700 In: 370 [P.O.:370] Out: 1920 [Urine:1100; Chest Tube:820]   Physical Exam:  Cardiovascular: [facility] Pulmonary: Coarse breath sounds on right Wounds: Dressing is clean and dry.   Chest Tube: to suction, no air leak  Lab Results: CBC: Recent Labs    11/13/18 0320 11/14/18 0600  WBC 8.7 9.3  HGB 7.1* 7.7*  HCT 23.4* 24.8*  PLT 330 360   BMET:  Recent Labs    11/13/18 0320  NA 138  K 3.7  CL 99  CO2 31  GLUCOSE 123*  BUN 21*  CREATININE 0.86  CALCIUM 7.8*    PT/INR:  Recent Labs    11/12/18 1826  LABPROT 15.1  INR 1.2   ABG:  INR: Will add last result for INR, ABG once components are confirmed Will add last 4 CBG results once components are confirmed  Assessment/Plan: Pulmonary - S/p 14 French pigtail tube 04/16 and thrombolytics yesterday. On 1 liter of oxygen via [place].  Chest tube is to suction and no air leak. Chest tube with 820 cc last 24 hours. CXR this am shows some improvement in right pleural effusion. Continue present management of tube. Check CXR in am.   Lelon Huh [employer] 11/14/2018,7:24 AM [phone]  I have seen and examined the patient and agree with the assessment and plan as outlined.  Chest tube still draining but CXR looks reasonably good w/ excellent reexpansion right lung and mild residual opacity right hemithorax.  I doubt that further  surgical intervention will be required.  Purcell Nails, MD 11/14/2018 10:12 AM

## 2018-11-15 ENCOUNTER — Inpatient Hospital Stay (HOSPITAL_COMMUNITY): Payer: Medicaid Other

## 2018-11-15 LAB — CBC
HCT: 25.2 % — ABNORMAL LOW (ref 39.0–52.0)
Hemoglobin: 7.5 g/dL — ABNORMAL LOW (ref 13.0–17.0)
MCH: 27.2 pg (ref 26.0–34.0)
MCHC: 29.8 g/dL — ABNORMAL LOW (ref 30.0–36.0)
MCV: 91.3 fL (ref 80.0–100.0)
Platelets: 347 10*3/uL (ref 150–400)
RBC: 2.76 MIL/uL — ABNORMAL LOW (ref 4.22–5.81)
RDW: 17.8 % — ABNORMAL HIGH (ref 11.5–15.5)
WBC: 7.5 10*3/uL (ref 4.0–10.5)
nRBC: 0 % (ref 0.0–0.2)

## 2018-11-15 LAB — MAGNESIUM: Magnesium: 2.2 mg/dL (ref 1.7–2.4)

## 2018-11-15 MED ORDER — SODIUM CHLORIDE 0.9 % IV SOLN
2.0000 g | Freq: Four times a day (QID) | INTRAVENOUS | Status: DC
  Administered 2018-11-15 – 2018-11-25 (×40): 2 g via INTRAVENOUS
  Filled 2018-11-15 (×46): qty 2000

## 2018-11-15 NOTE — Progress Notes (Signed)
CCMD called to notify Pt had 8 beats of short VT and  2.16 seconds paused. Pt were sleeping asymptomatically. Will continue to monitor.  Zackery Barefoot, PCCN,CMC,CSC

## 2018-11-15 NOTE — Progress Notes (Addendum)
      [address]       [name] [address]             [phone]           Subjective: Patient without specific complaints this am.  Objective: Vital signs in last 24 hours: Temp:  [97.5 F (36.4 C)-97.9 F (36.6 C)] 97.5 F (36.4 C) (04/19 0513) Pulse Rate:  [39-79] 76 (04/19 0513) Cardiac Rhythm: Atrial fibrillation;Bundle branch block (04/19 0705) Resp:  [15-25] 22 (04/19 0513) BP: (88-98)/(61-75) 98/75 (04/19 0513) SpO2:  [94 %-99 %] 99 % (04/19 0513) Weight:  [131.8 kg] 131.8 kg (04/18 1802)     Intake/Output from previous day: 04/18 0701 - 04/19 0700 In: 1996.5 [P.O.:1050; IV Piggyback:946.5] Out: 2330 [Urine:1830; Chest Tube:500]   Physical Exam:  Cardiovascular: IRRR IRRR Pulmonary: Breath sounds slightly diminished on the right Wounds: Dressing is clean and dry.   Chest Tube: to suction, no air leak (purulent drainage)  Lab Results: CBC: Recent Labs    11/14/18 0600 11/15/18 0527  WBC 9.3 7.5  HGB 7.7* 7.5*  HCT 24.8* 25.2*  PLT 360 347   BMET:  Recent Labs    11/13/18 0320 11/14/18 0600  NA 138 139  K 3.7 4.1  CL 99 98  CO2 31 32  GLUCOSE 123* 102*  BUN 21* 18  CREATININE 0.86 0.82  CALCIUM 7.8* 8.1*    PT/INR:  Recent Labs    11/12/18 1826  LABPROT 15.1  INR 1.2   ABG:  INR: Will add last result for INR, ABG once components are confirmed Will add last 4 CBG results once components are confirmed  Assessment/Plan: Pulmonary - S/p 14 French pigtail tube 04/16 and thrombolytics yesterday. On 1 liter of oxygen via [place].  Chest tube is to suction and no air leak. Chest tube with 500 cc last 24 hours. Await this am's CXR. Continue present management of tube. Check CXR in am.   Donielle M ZimmermanPA-C 11/15/2018,7:33 AM [phone]   I have seen and examined the patient and agree with the assessment and plan as outlined.  CXR looks good.  Chest tube drainage decreasing but remains significant.  Pleural fluid  culture growing GNR's but ID pending.  Consider stopping Vanc pending culture results  Purcell Nails, MD 11/15/2018 8:58 AM

## 2018-11-15 NOTE — Progress Notes (Signed)
PROGRESS NOTE  Aaron BaliGlenn Woodfield ONG:[id number] DOB: Nov 22, 1965 DOA: 11/12/2018 PCP: Darrin [facility], [facility] [place]   LOS: 3 days   Brief Narrative / Interim history: 53 year old male with no significant past medical history until her recent prolonged hospitalization from 09/30/20/9/20 for new onset ischemic CHF (ICM), three-vessel CAD (not a candidate for CABG per CTS), new onset A. Fib, spontaneous right thigh hematoma and GI bleed.  His hospital course was complicated by a dynamic ileus/SBO requiring NG tube.  He was discharged to CIR, and returns with progressive weakness and dyspnea over the last 2 to 3 days.   He had CXR that was concerning for hydro-versus hemothorax for which he was admitted.  CT chest was obtained and concerning for empyema.  Cardiothoracic surgery consulted by admitting provider and recommended pigtail placement by IR for potential thrombolytics in few days.  He was also started on Vanco and Zosyn for possible HAP.  Patient had chest tube placed later the day of admission removal of 800 cc thick brown purulent fluid right away followed by over 2 L purulent material the night of the procedure.  Had alteplase and dornase and continues to drain significantly.  Subjective: No major events overnight of this morning.  No complaint this morning.  Chest tube continues to have significant output of about 500 cc purulent material over the last 24hr.  Assessment & Plan: Active Problems:   Acute systolic CHF (congestive heart failure) (HCC)   Acute hypoxemic respiratory failure (HCC)   Morbid obesity (HCC)   Atrial fibrillation with rapid ventricular response (HCC)   Hypokalemia   Protein-calorie malnutrition, severe   Hematoma of right thigh   Acute blood loss anemia   Orthostatic hypotension   Empyema (HCC)  Dyspnea/fatigue-likely due to pleural effusion/parapneumonic effusion-improving. -Chest tube placement by IR on 4/16.  500 cc in the last 24-hour.  About 3 to  4 L so far.  -Cathflo alteplase/dornase alpha intrapleural injection by cardiothoracic surgery. -No fluid cytology or chemistry sent.  Culture grew pansensitive E. Coli.  Blood cultures NGTD -Vancomycin and Zosyn for HAP-4/16-4/19 -IV ampicillin 4/19-- -IR and CTS on board. -CTS planning thrombolytics  Chronic systolic CHF/ICM: Echo with EF of 13%.  Appears euvolemic. -Continue home Lasix -ARB on hold due to intermittent hypotension. -Daily weight, intake output and renal function.  History of three-vessel CAD: Not a candidate for CABG per CTS-stable. -Continue home medications (low-dose Coreg and Crestor)  History of blood loss anemia/GI bleed: Hemoglobin from 7.1-7.7 -We will transfuse if below 7. -Continue home PPI  Paroxysmal A. fib without RVR -Continue home Eliquis, amiodarone, digoxin and Coreg  Elevated liver enzymes: No history of liver disease.  CT on 3/23 did not show any hepatobiliary abnormalities. He is on amiodarone and Crestor which could contribute.  Stable. -Continue monitoring. -We will continue amiodarone and Crestor as liver enzymes seems to be stable.  Benefit outweighs the risk.  Protein calorie malnutrition: BMI 36.  Due to chronic illness. -Appreciate dietitian input-continue supplements.  Other chronic medical conditions stable.  Scheduled Meds: . amiodarone  200 mg Oral BID  . apixaban  5 mg Oral BID  . carvedilol  3.125 mg Oral BID WC  . digoxin  0.125 mg Oral Daily  . feeding supplement (ENSURE ENLIVE)  237 mL Oral TID BM  . furosemide  40 mg Oral Daily  . pantoprazole  40 mg Oral BID AC  . polyethylene glycol  17 g Oral BID  . rosuvastatin  20 mg Oral  q1800   Continuous Infusions: . piperacillin-tazobactam (ZOSYN)  IV 3.375 g (11/15/18 0942)  . vancomycin 250 mL/hr at 11/15/18 0700   PRN Meds:.acetaminophen, alum & mag hydroxide-simeth, lidocaine, sodium chloride flush  DVT prophylaxis: On Eliquis for atrial fibrillation Code Status:  Full code Family Communication: None at bedside.   Disposition Plan: Remains inpatient.  Still with significant chest tube drainage.   Consultants:   CTS  IR  Procedures:   Chest tube placement on 4/16  Antimicrobials:  Vancomycin 4/16--4/19  Zosyn 4/16--4/19  Ampicillin 4/19--  Objective: Vitals:   11/15/18 0000 11/15/18 0513 11/15/18 0802 11/15/18 0937  BP:   Pulse: 79 76 72 83  Resp: 19 (!) 22 (!) 26   Temp: 97.6 F (36.4 C) (!) 97.5 F (36.4 C) 97.6 F (36.4 C)   TempSrc: Oral Oral Oral   SpO2: 95% 99% 96%   Weight:        Intake/Output Summary (Last 24 hours) at 11/15/2018 1114 Last data filed at 11/15/2018 0941 Gross per 24 hour  Intake 2466.3 ml  Output 1980 ml  Net 486.3 ml   Filed Weights   11/14/18 1802  Weight: 131.8 kg    Examination: GENERAL: Appears chronically ill.  No acute distress. HEENT: MMM.  Vision and Hearing grossly intact.  Conjunctiva pale. NECK: Supple.  No JVD.  LUNGS:  No IWOB.  Diminished aeration on the right side.  Chest tube in the right chest draining purulent material HEART:  RRR. Heart sounds normal.  ABD: Bowel sounds present. Soft. Non tender.  EXT: Unna boot over both lower extremities. SKIN: no apparent skin lesion.  Unna boot over lower extremities.  Appears pale. NEURO: Awake, alert and oriented appropriately.  No gross deficit.  PSYCH: Calm. Normal affect.  Data Reviewed: I have independently reviewed following labs and imaging studies  CBC: Recent Labs  Lab 11/11/18 0459 11/12/18 1047 11/13/18 0320 11/14/18 0600 11/15/18 0527  WBC 8.7 8.8 8.7 9.3 7.5  HGB 7.0* 7.2* 7.1* 7.7* 7.5*  HCT 23.5* 23.9* 23.4* 24.8* 25.2*  MCV 92.9 93.0 91.1 90.5 91.3  PLT 334 341 330 360 347   Basic Metabolic Panel: Recent Labs  Lab 11/11/18 0459 11/13/18 0320 11/14/18 0600 11/15/18 0527  NA 138 138 139  --   K 4.0 3.7 4.1  --   CL 98 99 98  --   CO2 32 31 32  --   GLUCOSE 105* 123* 102*  --    BUN 20 21* 18  --   CREATININE 0.83 0.86 0.82  --   CALCIUM 8.1* 7.8* 8.1*  --   MG  --   --  2.2 2.2   GFR: Estimated Creatinine Clearance: 152.7 mL/min (by C-G formula based on SCr of 0.82 mg/dL). Liver Function Tests: Recent Labs  Lab 11/13/18 0320 11/14/18 0600  AST 79* 53*  ALT 98* 88*  ALKPHOS 123 118  BILITOT 0.7 1.0  PROT 5.3* 5.8*  ALBUMIN 1.4* 1.4*   No results for input(s): LIPASE, AMYLASE in the last 168 hours. No results for input(s): AMMONIA in the last 168 hours. Coagulation Profile: Recent Labs  Lab 11/12/18 1826  INR 1.2   Cardiac Enzymes: No results for input(s): CKTOTAL, CKMB, CKMBINDEX, TROPONINI in the last 168 hours. BNP (last 3 results) No results for input(s): PROBNP in the last 8760 hours. HbA1C: No results for input(s): HGBA1C in the last 72 hours. CBG: No results for input(s): GLUCAP in the last 168 hours.  Lipid Profile: No results for input(s): CHOL, HDL, LDLCALC, TRIG, CHOLHDL, LDLDIRECT in the last 72 hours. Thyroid Function Tests: No results for input(s): TSH, T4TOTAL, FREET4, T3FREE, THYROIDAB in the last 72 hours. Anemia Panel: No results for input(s): VITAMINB12, FOLATE, FERRITIN, TIBC, IRON, RETICCTPCT in the last 72 hours. Urine analysis:    Component Value Date/Time   COLORURINE AMBER (A) 10/01/2018 1523   APPEARANCEUR HAZY (A) 10/01/2018 1523   LABSPEC 1.023 10/01/2018 1523   PHURINE 5.0 10/01/2018 1523   GLUCOSEU NEGATIVE 10/01/2018 1523   HGBUR SMALL (A) 10/01/2018 1523   BILIRUBINUR NEGATIVE 10/01/2018 1523   KETONESUR NEGATIVE 10/01/2018 1523   PROTEINUR NEGATIVE 10/01/2018 1523   NITRITE NEGATIVE 10/01/2018 1523   LEUKOCYTESUR NEGATIVE 10/01/2018 1523   Sepsis Labs: Invalid input(s): PROCALCITONIN, LACTICIDVEN  Recent Results (from the past 240 hour(s))  Culture, blood (Routine X 2) w Reflex to ID Panel     Status: None (Preliminary result)   Collection Time: 11/12/18  2:59 PM  Result Value Ref Range Status    Specimen Description BLOOD RIGHT ARM  Final   Special Requests   Final    BOTTLES DRAWN AEROBIC ONLY Blood Culture adequate volume   Culture   Final    NO GROWTH 3 DAYS Performed at [facility], [address], [place], [place] [address]    Report Status PENDING  Incomplete  Culture, blood (Routine X 2) w Reflex to ID Panel     Status: None (Preliminary result)   Collection Time: 11/12/18  3:05 PM  Result Value Ref Range Status   Specimen Description BLOOD LEFT HAND  Final   Special Requests   Final    BOTTLES DRAWN AEROBIC ONLY Blood Culture adequate volume   Culture   Final    NO GROWTH 3 DAYS Performed at [facility], [address] [place], [place] [address]    Report Status PENDING  Incomplete  Aerobic/Anaerobic Culture (surgical/deep wound)     Status: None   Collection Time: 11/12/18  6:02 PM  Result Value Ref Range Status   Specimen Description PLEURAL  Final   Special Requests RIGHT  Final   Gram Stain   Final    MODERATE WBC PRESENT, PREDOMINANTLY PMN RARE GRAM NEGATIVE RODS Performed at [facility], [address], [place], [place] [address]    Culture MODERATE ESCHERICHIA COLI  Final   Report Status 11/15/2018 FINAL  Final   Organism ID, Bacteria ESCHERICHIA COLI  Final      Susceptibility   Escherichia coli - MIC*    AMPICILLIN 8 SENSITIVE Sensitive     CEFAZOLIN <=4 SENSITIVE Sensitive     CEFEPIME <=1 SENSITIVE Sensitive     CEFTAZIDIME <=1 SENSITIVE Sensitive     CEFTRIAXONE <=1 SENSITIVE Sensitive     CIPROFLOXACIN <=0.25 SENSITIVE Sensitive     GENTAMICIN <=1 SENSITIVE Sensitive     IMIPENEM <=0.25 SENSITIVE Sensitive     TRIMETH/SULFA <=20 SENSITIVE Sensitive     AMPICILLIN/SULBACTAM <=2 SENSITIVE Sensitive     PIP/TAZO <=4 SENSITIVE Sensitive     Extended ESBL NEGATIVE Sensitive     * MODERATE ESCHERICHIA COLI  Expectorated sputum assessment w rflx to resp cult     Status: None   Collection Time: 11/12/18  8:24 PM  Result  Value Ref Range Status   Specimen Description EXPECTORATED SPUTUM  Final   Special Requests NONE  Final   Sputum evaluation   Final  Sputum specimen not acceptable for testing.  Please recollect.   RESULT CALLED TO, READ BACK BY AND VERIFIED WITH: DAVIS AT 0736 ON [id number] BY SJW Performed at [facility], [address] [place], [place] [address]    Report Status 11/13/2018 FINAL  Final      Radiology Studies: Dg Chest Port 1 View  Result Date: 11/15/2018 CLINICAL DATA:  Evaluate for pneumothorax EXAM: PORTABLE CHEST 1 VIEW COMPARISON:  November 14, 2018 FINDINGS: A right chest tube remains in stable position. No pneumothorax. A left PICC line terminates in the SVC. Probable atelectasis in the incompletely evaluated left base. A right-sided pleural effusion with underlying opacity is not significantly changed in the interval. IMPRESSION: 1. No significant interval change. 2. Stable right chest tube with no pneumothorax. 3. Right pleural effusion with underlying opacity is similar in the interval. Electronically Signed   By: Gerome Sam III M.D   On: 11/15/2018 08:40    T[facility] Pager [phone]  If 7PM-7AM, please contact night-coverage [email or web address] Password [email or web address] 11/15/2018, 11:14 AM

## 2018-11-16 ENCOUNTER — Inpatient Hospital Stay (HOSPITAL_COMMUNITY): Payer: Medicaid Other

## 2018-11-16 ENCOUNTER — Other Ambulatory Visit: Payer: Self-pay

## 2018-11-16 ENCOUNTER — Encounter (HOSPITAL_COMMUNITY): Payer: Self-pay

## 2018-11-16 HISTORY — PX: CHEST TUBE INSERTION: SHX231

## 2018-11-16 LAB — CBC
HCT: 24.2 % — ABNORMAL LOW (ref 39.0–52.0)
Hemoglobin: 7.4 g/dL — ABNORMAL LOW (ref 13.0–17.0)
MCH: 28 pg (ref 26.0–34.0)
MCHC: 30.6 g/dL (ref 30.0–36.0)
MCV: 91.7 fL (ref 80.0–100.0)
Platelets: 352 10*3/uL (ref 150–400)
RBC: 2.64 MIL/uL — ABNORMAL LOW (ref 4.22–5.81)
RDW: 17.8 % — ABNORMAL HIGH (ref 11.5–15.5)
WBC: 9.4 10*3/uL (ref 4.0–10.5)
nRBC: 0 % (ref 0.0–0.2)

## 2018-11-16 NOTE — Progress Notes (Addendum)
      [address]       [name] [address]             [phone]           Subjective: Patient has minor discomfort at chest tube site this am  Objective: Vital signs in last 24 hours: Temp:  [97.6 F (36.4 C)-98.3 F (36.8 C)] 97.6 F (36.4 C) (04/20 0431) Pulse Rate:  [70-83] 73 (04/20 0431) Cardiac Rhythm: Atrial fibrillation;Bundle branch block (04/19 1922) Resp:  [13-26] 25 (04/20 0431) BP: (95-112)/(56-71) 112/57 (04/20 0431) SpO2:  [94 %-99 %] 99 % (04/20 0431)     Intake/Output from previous day: 04/19 0701 - 04/20 0700 In: 992 [P.O.:880; IV Piggyback:112] Out: 2195 [Urine:1850; Chest Tube:345]   Physical Exam:  Cardiovascular: IRRR IRRR Pulmonary: Breath sounds slightly diminished on the right Wounds: Dressing is clean and dry.   Chest Tube: to suction, no air leak (purulent drainage)  Lab Results: CBC: Recent Labs    11/15/18 0527 11/16/18 0600  WBC 7.5 9.4  HGB 7.5* 7.4*  HCT 25.2* 24.2*  PLT 347 352   BMET:  Recent Labs    11/14/18 0600  NA 139  K 4.1  CL 98  CO2 32  GLUCOSE 102*  BUN 18  CREATININE 0.82  CALCIUM 8.1*    PT/INR:  No results for input(s): LABPROT, INR in the last 72 hours. ABG:  INR: Will add last result for INR, ABG once components are confirmed Will add last 4 CBG results once components are confirmed  Assessment/Plan: Pulmonary - S/p 14 French pigtail tube 04/16 and thrombolytics yesterday. On 1-2 liter of oxygen via .  Chest tube is to suction and no air leak. Chest tube with 345 cc last 24 hours. Right pleural fluid culture showed E. Coli. Continue present management of tube. Check CXR in am. ID-on Ampicillin   Donielle M ZimmermanPA-C 11/16/2018,7:22 AM [phone]  Patient seen and examined, agree with above Cultures growing E coli- on ampicillin CT drainage trending down Will change to water seal Ok to resume PT  Viviann Spare C. Dorris Fetch, MD [facility]  [phone]

## 2018-11-16 NOTE — Progress Notes (Signed)
Chest tube drainage container changed.   Versie Starks, RN

## 2018-11-16 NOTE — Progress Notes (Signed)
[name], male   DOB: July 31, 1965, 53 y.o.   MRN: [id number] IR note via telephone due to virus restrictions: spoke with nurse; s/p right empyema chest drain placement 4/16; afebrile; drain intact, no air leak; now to water seal; output about 200-300 cc per shift; CXR today- no change from yesterday; small residual rt effusion; cx- e coli; plans as per TCTS/check am CXR.

## 2018-11-16 NOTE — Progress Notes (Signed)
Updated patient's wife.  All questions answered.  Appreciative.

## 2018-11-16 NOTE — Progress Notes (Signed)
PROGRESS NOTE  Aaron [name] MAU:[id number] DOB: 06-02-1966 DOA: 11/12/2018 PCP: [facility] [place]   LOS: 4 days   Brief Narrative / Interim history: 53 year old male with no significant past medical history until her recent prolonged hospitalization from 09/30/20/9/20 for new onset ischemic CHF (ICM), three-vessel CAD (not a candidate for CABG per CTS), new onset A. Fib, spontaneous right thigh hematoma and GI bleed.  His hospital course was complicated by a dynamic ileus/SBO requiring NG tube.  He was discharged to CIR, and returns with progressive weakness and dyspnea over the last 2 to 3 days.   He had CXR that was concerning for hydro-versus hemothorax for which he was admitted.  CT chest was obtained and concerning for empyema.  Cardiothoracic surgery consulted by admitting provider and recommended pigtail placement by IR for potential thrombolytics in few days.  He was also started on Vanco and Zosyn for possible HAP/parapneumonic effusion.  Culture grew pansensitive E. Coli.  Transitioned to ampicillin on 11/15/2018.  Patient had chest tube placed later the day of admission removal of 800 cc thick brown purulent fluid right away followed by over 2 L purulent material the night of the procedure.  Has had thrombolytics.  Subjective: No major events overnight of this morning.  No complaints this morning.  Chest tube drained about 345 cc in the last 24-hour.  Denies chest pain, dyspnea or abdominal pain.   Assessment & Plan: Active Problems:   Acute systolic CHF (congestive heart failure) (HCC)   Acute hypoxemic respiratory failure (HCC)   Morbid obesity (HCC)   Atrial fibrillation with rapid ventricular response (HCC)   Hypokalemia   Protein-calorie malnutrition, severe   Hematoma of right thigh   Acute blood loss anemia   Orthostatic hypotension   Empyema (HCC)  Dyspnea/fatigue-likely due to pleural effusion/parapneumonic effusion-improving. -Status post chest  tube placement by IR on 4/16 and thrombolytics.  -Chest tube drained 345 cc in the last 24-hour.  About 3.7 L so far. y. -No fluid cytology or chemistry sent.   -Culture grew pansensitive E. Coli.  Blood cultures NGTD -Vancomycin and Zosyn for HAP-4/16-4/19 -IV ampicillin 4/19-- -IR and CTS on board. -Chest tube to waterseal today. -PT resumed  Chronic systolic CHF/ICM: Echo with EF of 13%.  Appears euvolemic. -Continue home Lasix -ARB on hold due to intermittent hypotension. -Daily weight, intake output and renal function.  History of three-vessel CAD: Not a candidate for CABG per CTS-stable. -Continue home medications (low-dose Coreg and Crestor)  History of blood loss anemia/GI bleed: Hemoglobin from 7.1-7.7 -We will transfuse if below 7. -Continue home PPI  Paroxysmal A. fib without RVR -Continue home Eliquis, amiodarone, digoxin and Coreg  Elevated liver enzymes: No history of liver disease.  CT on 3/23 did not show any hepatobiliary abnormalities. He is on amiodarone and Crestor which could contribute.  Stable. -We will continue amiodarone and Crestor as liver enzymes seems to be stable.  Benefit outweighs the risk. -We will check liver enzymes intermittently.  Protein calorie malnutrition: BMI 36.  Due to chronic illness. -Appreciate dietitian input-continue supplements.  Other chronic medical conditions stable.  Scheduled Meds: . amiodarone  200 mg Oral BID  . apixaban  5 mg Oral BID  . carvedilol  3.125 mg Oral BID WC  . digoxin  0.125 mg Oral Daily  . feeding supplement (ENSURE ENLIVE)  237 mL Oral TID BM  . furosemide  40 mg Oral Daily  . pantoprazole  40 mg Oral BID AC  .  polyethylene glycol  17 g Oral BID  . rosuvastatin  20 mg Oral q1800   Continuous Infusions: . ampicillin (OMNIPEN) IV 2 g (11/16/18 0807)   PRN Meds:.acetaminophen, alum & mag hydroxide-simeth, lidocaine, sodium chloride flush  DVT prophylaxis: On Eliquis for atrial fibrillation Code  Status: Full code Family Communication: None at bedside.   Disposition Plan: Remains inpatient.  Still with significant chest tube drainage.   Consultants:   CTS  IR  Procedures:   Chest tube placement on 4/16  Antimicrobials:  Vancomycin 4/16--4/19  Zosyn 4/16--4/19  Ampicillin 4/19--  Objective: Vitals:   11/15/18 2328 11/16/18 0431 11/16/18 0804 11/16/18 0809  BP: 102/71 (!) 112/57 97/71   Pulse: 79 73 71 83  Resp: 13 (!) 25    Temp: 97.6 F (36.4 C) 97.6 F (36.4 C) 97.7 F (36.5 C)   TempSrc: Oral Oral Oral   SpO2: 97% 99% 96%   Weight:        Intake/Output Summary (Last 24 hours) at 11/16/2018 1101 Last data filed at 11/16/2018 1005 Gross per 24 hour  Intake 1196 ml  Output 2207 ml  Net -1011 ml   Filed Weights   11/14/18 1802  Weight: 131.8 kg    Examination:  GENERAL: Appears chronically ill.  No acute distress. HEENT: MMM.  Vision and Hearing grossly intact.  NECK: Supple.  No JVD.  LUNGS:  No IWOB.  Diminished aeration on the right side.  Chest tube in the right chest HEART:  RRR. Heart sounds normal.  ABD: Bowel sounds present. Soft. Non tender.  EXT: Unna boot over lower extremities. SKIN: no apparent skin lesion.  Unna boot over lower extremities. NEURO: Awake, alert and oriented appropriately.  No gross deficit.  PSYCH: Calm. Normal affect.  Data Reviewed: I have independently reviewed following labs and imaging studies  CBC: Recent Labs  Lab 11/12/18 1047 11/13/18 0320 11/14/18 0600 11/15/18 0527 11/16/18 0600  WBC 8.8 8.7 9.3 7.5 9.4  HGB 7.2* 7.1* 7.7* 7.5* 7.4*  HCT 23.9* 23.4* 24.8* 25.2* 24.2*  MCV 93.0 91.1 90.5 91.3 91.7  PLT 341 330 360 347 352   Basic Metabolic Panel: Recent Labs  Lab 11/11/18 0459 11/13/18 0320 11/14/18 0600 11/15/18 0527  NA 138 138 139  --   K 4.0 3.7 4.1  --   CL 98 99 98  --   CO2 32 31 32  --   GLUCOSE 105* 123* 102*  --   BUN 20 21* 18  --   CREATININE 0.83 0.86 0.82  --   CALCIUM  8.1* 7.8* 8.1*  --   MG  --   --  2.2 2.2   GFR: Estimated Creatinine Clearance: 152.7 mL/min (by C-G formula based on SCr of 0.82 mg/dL). Liver Function Tests: Recent Labs  Lab 11/13/18 0320 11/14/18 0600  AST 79* 53*  ALT 98* 88*  ALKPHOS 123 118  BILITOT 0.7 1.0  PROT 5.3* 5.8*  ALBUMIN 1.4* 1.4*   No results for input(s): LIPASE, AMYLASE in the last 168 hours. No results for input(s): AMMONIA in the last 168 hours. Coagulation Profile: Recent Labs  Lab 11/12/18 1826  INR 1.2   Cardiac Enzymes: No results for input(s): CKTOTAL, CKMB, CKMBINDEX, TROPONINI in the last 168 hours. BNP (last 3 results) No results for input(s): PROBNP in the last 8760 hours. HbA1C: No results for input(s): HGBA1C in the last 72 hours. CBG: No results for input(s): GLUCAP in the last 168 hours. Lipid Profile: No results  for input(s): CHOL, HDL, LDLCALC, TRIG, CHOLHDL, LDLDIRECT in the last 72 hours. Thyroid Function Tests: No results for input(s): TSH, T4TOTAL, FREET4, T3FREE, THYROIDAB in the last 72 hours. Anemia Panel: No results for input(s): VITAMINB12, FOLATE, FERRITIN, TIBC, IRON, RETICCTPCT in the last 72 hours. Urine analysis:    Component Value Date/Time   COLORURINE AMBER (A) 10/01/2018 1523   APPEARANCEUR HAZY (A) 10/01/2018 1523   LABSPEC 1.023 10/01/2018 1523   PHURINE 5.0 10/01/2018 1523   GLUCOSEU NEGATIVE 10/01/2018 1523   HGBUR SMALL (A) 10/01/2018 1523   BILIRUBINUR NEGATIVE 10/01/2018 1523   KETONESUR NEGATIVE 10/01/2018 1523   PROTEINUR NEGATIVE 10/01/2018 1523   NITRITE NEGATIVE 10/01/2018 1523   LEUKOCYTESUR NEGATIVE 10/01/2018 1523   Sepsis Labs: Invalid input(s): PROCALCITONIN, LACTICIDVEN  Recent Results (from the past 240 hour(s))  Culture, blood (Routine X 2) w Reflex to ID Panel     Status: None (Preliminary result)   Collection Time: 11/12/18  2:59 PM  Result Value Ref Range Status   Specimen Description BLOOD RIGHT ARM  Final   Special Requests    Final    BOTTLES DRAWN AEROBIC ONLY Blood Culture adequate volume   Culture   Final    NO GROWTH 3 DAYS Performed at [facility], [address] [place], [place] [address]    Report Status PENDING  Incomplete  Culture, blood (Routine X 2) w Reflex to ID Panel     Status: None (Preliminary result)   Collection Time: 11/12/18  3:05 PM  Result Value Ref Range Status   Specimen Description BLOOD LEFT HAND  Final   Special Requests   Final    BOTTLES DRAWN AEROBIC ONLY Blood Culture adequate volume   Culture   Final    NO GROWTH 3 DAYS Performed at [facility], [address] [place], [place] [address]    Report Status PENDING  Incomplete  Aerobic/Anaerobic Culture (surgical/deep wound)     Status: None   Collection Time: 11/12/18  6:02 PM  Result Value Ref Range Status   Specimen Description PLEURAL  Final   Special Requests RIGHT  Final   Gram Stain   Final    MODERATE WBC PRESENT, PREDOMINANTLY PMN RARE GRAM NEGATIVE RODS Performed at [facility], [address], [place], [place] [address]    Culture MODERATE ESCHERICHIA COLI  Final   Report Status 11/15/2018 FINAL  Final   Organism ID, Bacteria ESCHERICHIA COLI  Final      Susceptibility   Escherichia coli - MIC*    AMPICILLIN 8 SENSITIVE Sensitive     CEFAZOLIN <=4 SENSITIVE Sensitive     CEFEPIME <=1 SENSITIVE Sensitive     CEFTAZIDIME <=1 SENSITIVE Sensitive     CEFTRIAXONE <=1 SENSITIVE Sensitive     CIPROFLOXACIN <=0.25 SENSITIVE Sensitive     GENTAMICIN <=1 SENSITIVE Sensitive     IMIPENEM <=0.25 SENSITIVE Sensitive     TRIMETH/SULFA <=20 SENSITIVE Sensitive     AMPICILLIN/SULBACTAM <=2 SENSITIVE Sensitive     PIP/TAZO <=4 SENSITIVE Sensitive     Extended ESBL NEGATIVE Sensitive     * MODERATE ESCHERICHIA COLI  Expectorated sputum assessment w rflx to resp cult     Status: None   Collection Time: 11/12/18  8:24 PM  Result Value Ref Range Status   Specimen Description EXPECTORATED SPUTUM   Final   Special Requests NONE  Final   Sputum evaluation   Final    Sputum specimen not acceptable  for testing.  Please recollect.   RESULT CALLED TO, READ BACK BY AND VERIFIED WITH: DAVIS AT 0736 ON [id number] BY SJW Performed at [facility], [address] [place], [place] [address]    Report Status 11/13/2018 FINAL  Final      Radiology Studies: Dg Chest Port 1 View  Result Date: 11/16/2018 CLINICAL DATA:  Follow-up right pleural effusion EXAM: PORTABLE CHEST 1 VIEW COMPARISON:  11/15/2018 FINDINGS: Pigtail catheter is again noted on the right stable in appearance. Cardiac shadow remains enlarged. Left PICC line is again noted. Left lung is clear. Residual small pleural effusion on the right is noted. No significant change from the prior exam is noted. IMPRESSION: No change from the previous exam. Electronically Signed   By: Alcide Clever M.D.   On: 11/16/2018 08:10    Taye T. [facility] Pager [phone]  If 7PM-7AM, please contact night-coverage [email or web address] Password [email or web address] 11/16/2018, 11:01 AM

## 2018-11-17 ENCOUNTER — Inpatient Hospital Stay (HOSPITAL_COMMUNITY): Payer: Medicaid Other

## 2018-11-17 DIAGNOSIS — J90 Pleural effusion, not elsewhere classified: Secondary | ICD-10-CM

## 2018-11-17 LAB — AEROBIC/ANAEROBIC CULTURE W GRAM STAIN (SURGICAL/DEEP WOUND)

## 2018-11-17 LAB — CULTURE, BLOOD (ROUTINE X 2)
Culture: NO GROWTH
Culture: NO GROWTH
Special Requests: ADEQUATE
Special Requests: ADEQUATE

## 2018-11-17 LAB — HEMOGLOBIN AND HEMATOCRIT, BLOOD
HCT: 26.4 % — ABNORMAL LOW (ref 39.0–52.0)
Hemoglobin: 7.7 g/dL — ABNORMAL LOW (ref 13.0–17.0)

## 2018-11-17 LAB — AEROBIC/ANAEROBIC CULTURE (SURGICAL/DEEP WOUND)

## 2018-11-17 MED ORDER — AMIODARONE HCL 200 MG PO TABS
200.0000 mg | ORAL_TABLET | Freq: Every day | ORAL | Status: DC
  Administered 2018-11-18 – 2018-11-25 (×8): 200 mg via ORAL
  Filled 2018-11-17 (×8): qty 1

## 2018-11-17 NOTE — Progress Notes (Signed)
[facility]  Patient's chart reviewed for whether he still needs amiodarone 200mg  bid.  This was started on his previous hospitalization which was complicated by atrial fibrillation with RVR.  He was followed by the advanced heart failure service for acute systolic heart failure LVEF 13% and 3 vessel CAD.  He has received >5g of amiodarone.  We will reduce this to 200mg  daily.  Please notify cardiology when he is nearing discharge so that we can ensure he has proper follow up.  Kalea Perine C. Duke Salvia, MD, [facility] 11/17/2018 2:54 PM

## 2018-11-17 NOTE — Progress Notes (Signed)
PROGRESS NOTE  Aaron [name] JSE:[id number] DOB: Feb 17, 1966 DOA: 11/12/2018 PCP: [facility] [place]   LOS: 5 days   Brief Narrative / Interim history: 53 year old male with no significant past medical history until her recent prolonged hospitalization from 09/30/20/9/20 for new onset ischemic CHF (ICM), three-vessel CAD (not a candidate for CABG per CTS), new onset A. Fib, spontaneous right thigh hematoma and GI bleed.  His hospital course was complicated by a dynamic ileus/SBO requiring NG tube.  He was discharged to CIR, and returns with progressive weakness and dyspnea over the last 2 to 3 days.  He had CXR that was concerning for hydro-versus hemothorax for which he was admitted.  CT chest was obtained and concerning for empyema.  Cardiothoracic surgery consulted by admitting provider and recommended pigtail placement by IR for potential thrombolytics in few days.  He was also started on Vanco and Zosyn for possible HAP/parapneumonic effusion.  Patient had chest tube placed later the day of admission.  Also has had thrombolytics.  Chest tube drained about 4 L of purulent material since then.   Culture grew pansensitive E. Coli.  Transitioned to ampicillin on 11/15/2018.  Subjective: No major events overnight of this morning.  No complaint this morning.  Chest tube drained about 200 cc purulent material in the last 24 hours.  He denies chest pain, dyspnea or abdominal pain.  He is sitting on bedside chair today.  H&H stable.  Assessment & Plan: Active Problems:   Acute systolic CHF (congestive heart failure) (HCC)   Acute hypoxemic respiratory failure (HCC)   Morbid obesity (HCC)   Atrial fibrillation with rapid ventricular response (HCC)   Hypokalemia   Protein-calorie malnutrition, severe   Hematoma of right thigh   Acute blood loss anemia   Orthostatic hypotension   Empyema (HCC)  Dyspnea/fatigue-likely due to pleural effusion/parapneumonic effusion-improving.  -Status post chest tube placement by IR on 4/16 and thrombolytics.  -Chest tube drained 200 cc in the last 24-hour.  About 4 L so far. -No fluid cytology or chemistry sent.   -Culture grew pansensitive E. Coli.  Blood cultures NGTD -Vancomycin and Zosyn for HAP-4/16-4/19 -IV ampicillin 4/19-- -IR and CTS on board. -Chest tube to waterseal on 4/20 -Daily chest x-ray-stable. -PT resumed.  Chronic systolic CHF/ICM: Echo with EF of 13%.  Appears euvolemic.  No cardiopulmonary symptoms. -Continue home Lasix-good urine output. -ARB on hold due to soft blood pressures. -Daily weight, intake output and renal function.  History of three-vessel CAD: not a candidate for CABG per CTS during previous hospitalization-stable. -Continue home medications (low-dose Coreg and Crestor)  History of blood loss anemia/GI bleed: Hemoglobin from 7.1-7.7 -We will transfuse if below 7. -Continue home PPI  Paroxysmal A. fib without RVR -Continue home Eliquis, amiodarone, digoxin and Coreg  Elevated liver enzymes: No history of liver disease.  CT on 3/23 did not show any hepatobiliary abnormalities. He is on amiodarone and Crestor which could contribute.  Stable. -We will continue amiodarone and Crestor as liver enzymes seems to be stable.  Benefit outweighs the risk. -We will check liver enzymes intermittently.  Protein calorie malnutrition: BMI 36.  Due to chronic illness. -Appreciate dietitian input-continue supplements.  Other chronic medical conditions stable.  Scheduled Meds: . amiodarone  200 mg Oral BID  . apixaban  5 mg Oral BID  . carvedilol  3.125 mg Oral BID WC  . digoxin  0.125 mg Oral Daily  . feeding supplement (ENSURE ENLIVE)  237 mL Oral TID BM  .  furosemide  40 mg Oral Daily  . pantoprazole  40 mg Oral BID AC  . polyethylene glycol  17 g Oral BID  . rosuvastatin  20 mg Oral q1800   Continuous Infusions: . ampicillin (OMNIPEN) IV 2 g (11/17/18 0554)   PRN Meds:.acetaminophen,  alum & mag hydroxide-simeth, lidocaine, sodium chloride flush  DVT prophylaxis: On Eliquis for atrial fibrillation Code Status: Full code Family Communication: None at bedside.  Updated patient's wife on 11/16/2018 Disposition Plan: Remains inpatient.  Still with chest tube.   Consultants:   CTS  IR  Procedures:   Chest tube placement on 4/16  Antimicrobials:  Vancomycin 4/16--4/19  Zosyn 4/16--4/19  Ampicillin 4/19--  Objective: Vitals:   11/16/18 2340 11/17/18 0638 11/17/18 0814 11/17/18 0818  BP: 105/66 103/74  104/63  Pulse: 71 69 68 68  Resp: 19 20    Temp: 97.6 F (36.4 C) 97.6 F (36.4 C)  (!) 97.5 F (36.4 C)  TempSrc: Oral Oral  Oral  SpO2: 94% 99%  96%  Weight:        Intake/Output Summary (Last 24 hours) at 11/17/2018 1111 Last data filed at 11/17/2018 0640 Gross per 24 hour  Intake 384 ml  Output 2213 ml  Net -1829 ml   Filed Weights   11/14/18 1802  Weight: 131.8 kg    Examination:  GENERAL: Appears chronically ill.  No acute distress.  Sitting on bedside chair. HEENT: MMM.  Vision and Hearing grossly intact.  NECK: Supple.  No JVD.  LUNGS:  No IWOB.  Diminished aeration on the right side.  Chest tube in the right chest with purulent output. HEART:  RRR. Heart sounds normal.  ABD: Bowel sounds present. Soft. Non tender.  EXT: Unna boot over both lower extremities. SKIN: Unna boot over both lower extremities.  No apparent skin lesion elsewhere. NEURO: Awake, alert and oriented appropriately.  No gross deficit.  PSYCH: Calm. Normal affect.  Data Reviewed: I have independently reviewed following labs and imaging studies  CBC: Recent Labs  Lab 11/12/18 1047 11/13/18 0320 11/14/18 0600 11/15/18 0527 11/16/18 0600 11/17/18 0500  WBC 8.8 8.7 9.3 7.5 9.4  --   HGB 7.2* 7.1* 7.7* 7.5* 7.4* 7.7*  HCT 23.9* 23.4* 24.8* 25.2* 24.2* 26.4*  MCV 93.0 91.1 90.5 91.3 91.7  --   PLT 341 330 360 347 352  --    Basic Metabolic Panel: Recent  Labs  Lab 11/11/18 0459 11/13/18 0320 11/14/18 0600 11/15/18 0527  NA 138 138 139  --   K 4.0 3.7 4.1  --   CL 98 99 98  --   CO2 32 31 32  --   GLUCOSE 105* 123* 102*  --   BUN 20 21* 18  --   CREATININE 0.83 0.86 0.82  --   CALCIUM 8.1* 7.8* 8.1*  --   MG  --   --  2.2 2.2   GFR: Estimated Creatinine Clearance: 152.7 mL/min (by C-G formula based on SCr of 0.82 mg/dL). Liver Function Tests: Recent Labs  Lab 11/13/18 0320 11/14/18 0600  AST 79* 53*  ALT 98* 88*  ALKPHOS 123 118  BILITOT 0.7 1.0  PROT 5.3* 5.8*  ALBUMIN 1.4* 1.4*   No results for input(s): LIPASE, AMYLASE in the last 168 hours. No results for input(s): AMMONIA in the last 168 hours. Coagulation Profile: Recent Labs  Lab 11/12/18 1826  INR 1.2   Cardiac Enzymes: No results for input(s): CKTOTAL, CKMB, CKMBINDEX, TROPONINI in the last  168 hours. BNP (last 3 results) No results for input(s): PROBNP in the last 8760 hours. HbA1C: No results for input(s): HGBA1C in the last 72 hours. CBG: No results for input(s): GLUCAP in the last 168 hours. Lipid Profile: No results for input(s): CHOL, HDL, LDLCALC, TRIG, CHOLHDL, LDLDIRECT in the last 72 hours. Thyroid Function Tests: No results for input(s): TSH, T4TOTAL, FREET4, T3FREE, THYROIDAB in the last 72 hours. Anemia Panel: No results for input(s): VITAMINB12, FOLATE, FERRITIN, TIBC, IRON, RETICCTPCT in the last 72 hours. Urine analysis:    Component Value Date/Time   COLORURINE AMBER (A) 10/01/2018 1523   APPEARANCEUR HAZY (A) 10/01/2018 1523   LABSPEC 1.023 10/01/2018 1523   PHURINE 5.0 10/01/2018 1523   GLUCOSEU NEGATIVE 10/01/2018 1523   HGBUR SMALL (A) 10/01/2018 1523   BILIRUBINUR NEGATIVE 10/01/2018 1523   KETONESUR NEGATIVE 10/01/2018 1523   PROTEINUR NEGATIVE 10/01/2018 1523   NITRITE NEGATIVE 10/01/2018 1523   LEUKOCYTESUR NEGATIVE 10/01/2018 1523   Sepsis Labs: Invalid input(s): PROCALCITONIN, LACTICIDVEN  Recent Results (from  the past 240 hour(s))  Culture, blood (Routine X 2) w Reflex to ID Panel     Status: None   Collection Time: 11/12/18  2:59 PM  Result Value Ref Range Status   Specimen Description BLOOD RIGHT ARM  Final   Special Requests   Final    BOTTLES DRAWN AEROBIC ONLY Blood Culture adequate volume   Culture   Final    NO GROWTH 5 DAYS Performed at [facility], [address], [place], [place] [address]    Report Status 11/17/2018 FINAL  Final  Culture, blood (Routine X 2) w Reflex to ID Panel     Status: None   Collection Time: 11/12/18  3:05 PM  Result Value Ref Range Status   Specimen Description BLOOD LEFT HAND  Final   Special Requests   Final    BOTTLES DRAWN AEROBIC ONLY Blood Culture adequate volume   Culture   Final    NO GROWTH 5 DAYS Performed at [facility], [address] [place], [place] [address]    Report Status 11/17/2018 FINAL  Final  Aerobic/Anaerobic Culture (surgical/deep wound)     Status: None   Collection Time: 11/12/18  6:02 PM  Result Value Ref Range Status   Specimen Description PLEURAL  Final   Special Requests RIGHT  Final   Gram Stain   Final    MODERATE WBC PRESENT, PREDOMINANTLY PMN RARE GRAM NEGATIVE RODS    Culture   Final    MODERATE ESCHERICHIA COLI NO ANAEROBES ISOLATED Performed at [facility], [address] [place], [place] [address]    Report Status 11/15/2018 FINAL  Final   Organism ID, Bacteria ESCHERICHIA COLI  Final      Susceptibility   Escherichia coli - MIC*    AMPICILLIN 8 SENSITIVE Sensitive     CEFAZOLIN <=4 SENSITIVE Sensitive     CEFEPIME <=1 SENSITIVE Sensitive     CEFTAZIDIME <=1 SENSITIVE Sensitive     CEFTRIAXONE <=1 SENSITIVE Sensitive     CIPROFLOXACIN <=0.25 SENSITIVE Sensitive     GENTAMICIN <=1 SENSITIVE Sensitive     IMIPENEM <=0.25 SENSITIVE Sensitive     TRIMETH/SULFA <=20 SENSITIVE Sensitive     AMPICILLIN/SULBACTAM <=2 SENSITIVE Sensitive     PIP/TAZO <=4 SENSITIVE Sensitive      Extended ESBL NEGATIVE Sensitive     * MODERATE ESCHERICHIA COLI  Expectorated sputum assessment w rflx to resp cult  Status: None   Collection Time: 11/12/18  8:24 PM  Result Value Ref Range Status   Specimen Description EXPECTORATED SPUTUM  Final   Special Requests NONE  Final   Sputum evaluation   Final    Sputum specimen not acceptable for testing.  Please recollect.   RESULT CALLED TO, READ BACK BY AND VERIFIED WITH: DAVIS AT 0736 ON [id number] BY SJW Performed at [facility], [address] [place], [place] [address]    Report Status 11/13/2018 FINAL  Final      Radiology Studies: Dg Chest Port 1 View  Result Date: 11/17/2018 CLINICAL DATA:  53 year old male with a history of chest tube placement EXAM: PORTABLE CHEST 1 VIEW COMPARISON:  11/16/2018 FINDINGS: Cardiomediastinal silhouette unchanged. Opacity at the right lung is similar with pleuroparenchymal thickening, airspace opacity at the right lung base, partial obscuration the right hemidiaphragm. Unchanged left upper extremity PICC, appearing to terminate superior vena cava. Unchanged right thoracostomy pigtail drainage catheter. No pneumothorax. IMPRESSION: Unchanged right-sided thoracostomy tube, with persistent pleuroparenchymal thickening and right basilar opacity, compatible with residual pleural fluid/thickening and basilar atelectasis/consolidation. Unchanged left upper extremity PICC Electronically Signed   By: Gilmer MorJaime  Wagner D.O.   On: 11/17/2018 08:35    Reanna Scoggin T. [facility] Pager [phone]  If 7PM-7AM, please contact night-coverage [email or web address] Password [email or web address] 11/17/2018, 11:11 AM

## 2018-11-17 NOTE — Progress Notes (Signed)
[name], male   DOB: [birth year], 53 y.o.   MRN: [id number]   IR Rounding note via phone  Due to new restrictions  Rt chest empyema Drain placed 4/16 in IR  Placed to water seal yesterday am per Dr Alexis Goodell; + air leak per RN and Dr Dorris Fetch note OP 200 cc yesterday   RN reports may DC Chest tube tomorrow Plan per Dr Dorris Fetch

## 2018-11-17 NOTE — Progress Notes (Signed)
Occupational Therapy Evaluation Patient Details Name: Aaron [name] MRN: [id number] DOB: Apr 19, 1966 Today's Date: 11/17/2018    History of Present Illness 53 year old male with no significant past medical history until her recent prolonged hospitalization from 09/30/20/9/20 for new onset ischemic CHF (ICM), three-vessel CAD (not a candidate for CABG per CTS), new onset A. Fib, spontaneous right thigh hematoma and GI bleed.  His hospital course was complicated by a dynamic ileus/SBO requiring NG tube.  He was discharged to CIR, returned to acute with progressive weakness and dyspnea over the last 2 to 3 days. CT chest was obtained and concerning for empyema. Chest tube placement 4/16   Clinical Impression   Pt presents with generalized weakness and pain/discomfort in chest (at site of chest tube).  He was very motivated to transfer OOB with use of maximove. Min assist for rolling to left/right sides in bed for toileting with bed pan and for total assist with back peri hygiene and for placement of maximove pad.  Pt will benefit from continued acute OT to maximize safety and independence with ADLs. Recommending CIR for discharge planning.     Follow Up Recommendations  CIR;Supervision/Assistance - 24 hour    Equipment Recommendations  Other (comment)(defer to next venue)    Recommendations for Other Services Rehab consult     Precautions / Restrictions Precautions Precautions: Fall Precaution Comments: monitor BP Required Braces or Orthoses: Other Brace Other Brace: RLE Bledsoe brace locked in extension when up- okay to unlock at EOB  Restrictions Weight Bearing Restrictions: No RLE Weight Bearing: Weight bearing as tolerated LLE Weight Bearing: Weight bearing as tolerated      Mobility Bed Mobility Overal bed mobility: Needs Assistance Bed Mobility: Rolling Rolling: Min assist         General bed mobility comments: min A for rolling to L for bed pan placement and for rolling L/R  for lift pad placement   Transfers Overall transfer level: Needs assistance               General transfer comment: Pt transferred from bed to chair with maximove.    Balance                                           ADL either performed or assessed with clinical judgement   ADL       Grooming: Wash/dry face;Wash/dry hands;Minimal assistance;Bed level   Upper Body Bathing: Minimal assistance;Bed level   Lower Body Bathing: Total assistance;Bed level   Upper Body Dressing : Moderate assistance;Bed level   Lower Body Dressing: Total assistance;Bed level       Toileting- Clothing Manipulation and Hygiene: Total assistance;Bed level         General ADL Comments: Pt participated in rolling in bed for placement of bed pan.  Total assist for back peri care following BM.  Pt participated in rolling left and right for placement of maxi lift pad in prep for transfer OOB.     Vision Baseline Vision/History: Wears glasses Wears Glasses: Reading only Patient Visual Report: No change from baseline Vision Assessment?: No apparent visual deficits     Perception     Praxis      Pertinent Vitals/Pain Pain Assessment: Faces Faces Pain Scale: Hurts a little bit Pain Location: chest wall  Pain Descriptors / Indicators: Discomfort;Grimacing Pain Intervention(s): Limited activity within patient's tolerance     Hand  Dominance     Extremity/Trunk Assessment Upper Extremity Assessment Upper Extremity Assessment: Generalized weakness   Lower Extremity Assessment Lower Extremity Assessment: Defer to PT evaluation       Communication Communication Communication: No difficulties   Cognition Arousal/Alertness: Awake/alert Behavior During Therapy: WFL for tasks assessed/performed Overall Cognitive Status: Within Functional Limits for tasks assessed                                     General Comments  Pt has Bledsoe brace locked in  extension on R LE which was donned for OOB, Bledsoe Brace initially placed for R thigh hematoma, asked RN to contact Dr. August Saucer to see if there is still need after 4 weeks as it would aid in progressing mobility. Pt with [place] in place however O2 not on pt SaO2 98%O2 so [place] removed.     Exercises     Shoulder Instructions      Home Living Family/patient expects to be discharged to:: Inpatient rehab                                        Prior Functioning/Environment Level of Independence: Independent        Comments: prior to hospitalization, has been in hospital since 3/5 and requires lift equipment for mobility         OT Problem List: Decreased strength;Decreased range of motion;Decreased activity tolerance;Impaired balance (sitting and/or standing);Decreased knowledge of use of DME or AE;Decreased knowledge of precautions;Pain;Increased edema;Cardiopulmonary status limiting activity      OT Treatment/Interventions: Self-care/ADL training;Therapeutic exercise;Energy conservation;DME and/or AE instruction;Therapeutic activities;Patient/family education    OT Goals(Current goals can be found in the care plan section) Acute Rehab OT Goals Patient Stated Goal: to get in the chair OT Goal Formulation: With patient Time For Goal Achievement: 12/01/18 Potential to Achieve Goals: Good  OT Frequency: Min 3X/week   Barriers to D/C:            Co-evaluation PT/OT/SLP Co-Evaluation/Treatment: Yes Reason for Co-Treatment: For patient/therapist safety;Complexity of the patient's impairments (multi-system involvement) PT goals addressed during session: Mobility/safety with mobility OT goals addressed during session: ADL's and self-care      AM-PAC OT "6 Clicks" Daily Activity     Outcome Measure Help from another person eating meals?: None Help from another person taking care of personal grooming?: A Little Help from another person toileting, which includes using  toliet, bedpan, or urinal?: Total Help from another person bathing (including washing, rinsing, drying)?: A Lot Help from another person to put on and taking off regular upper body clothing?: A Lot Help from another person to put on and taking off regular lower body clothing?: Total 6 Click Score: 13   End of Session Equipment Utilized During Treatment: (bledsoe brace) Nurse Communication: Mobility status(request updated orders from MD for bledsoe and CPM)  Activity Tolerance: Patient tolerated treatment well Patient left: in chair;with call bell/phone within reach  OT Visit Diagnosis: Unsteadiness on feet (R26.81);Other abnormalities of gait and mobility (R26.89);Muscle weakness (generalized) (M62.81);Pain Pain - Right/Left: Right(bilateral) Pain - part of body: Leg(right chest)                Time: 1856-3149 OT Time Calculation (min): 40 min Charges:  OT General Charges $OT Visit: 1 Visit OT Evaluation $OT Eval Moderate Complexity: 1  Mod   Cipriano Mile OTR/L Acute Rehabilitation Services [phone] 11/17/2018, 12:11 PM

## 2018-11-17 NOTE — Evaluation (Signed)
Physical Therapy Evaluation Patient Details Name: Aaron [name] MRN: [id number] DOB: [birth year] Today's Date: 11/17/2018   History of Present Illness  53 year old male with no significant past medical history until her recent prolonged hospitalization from 09/30/20/9/20 for new onset ischemic CHF (ICM), three-vessel CAD (not a candidate for CABG per CTS), new onset A. Fib, spontaneous right thigh hematoma and GI bleed.  His hospital course was complicated by a dynamic ileus/SBO requiring NG tube.  He was discharged to CIR, returned to acute with progressive weakness and dyspnea over the last 2 to 3 days. CT chest was obtained and concerning for empyema. Chest tube placement 4/16  Clinical Impression  PT from CIR where he was making progress towards his goals until limited by dyspnea. With exception of pain at location of chest tube pt is feeling well today and is motivated to get OOB with therapy. Pt requested utilizing bed pan first and was minA for rolling for bedpan and lift pad placement. Pt able to sit up for greater than 1 hour. PT recommends return to CIR to complete rehab after discharge from acute care. PT will continue to follow acutely.     Follow Up Recommendations CIR;Supervision/Assistance - 24 hour    Equipment Recommendations  Other (comment)(TBD)    Recommendations for Other Services       Precautions / Restrictions Precautions Precautions: Fall Precaution Comments: monitor BP Required Braces or Orthoses: Other Brace Other Brace: RLE Bledsoe brace locked in extension when up- okay to unlock at EOB  Restrictions Weight Bearing Restrictions: No RLE Weight Bearing: Weight bearing as tolerated LLE Weight Bearing: Weight bearing as tolerated      Mobility  Bed Mobility Overal bed mobility: Needs Assistance Bed Mobility: Rolling Rolling: Min assist         General bed mobility comments: min A for rolling to L for bed pan placement and for rolling L/R for lift pad  placement   Transfers Overall transfer level: Needs assistance               General transfer comment: Pt transferred from bed to chair with maximove.              Pertinent Vitals/Pain Pain Assessment: Faces Faces Pain Scale: Hurts a little bit Pain Location: chest wall  Pain Descriptors / Indicators: Discomfort;Grimacing Pain Intervention(s): Limited activity within patient's tolerance;Monitored during session;Repositioned    Home Living Family/patient expects to be discharged to:: Inpatient rehab                      Prior Function Level of Independence: Independent         Comments: prior to hospitalization, has been in hospital since 3/5 and requires lift equipment for mobility      Hand Dominance        Extremity/Trunk Assessment   Upper Extremity Assessment Upper Extremity Assessment: Defer to OT evaluation    Lower Extremity Assessment Lower Extremity Assessment: RLE deficits/detail;Generalized weakness RLE Deficits / Details: hx of R LE hematoma and placement of Bledsoe brace by orthopedist, ROM of hip and knee limited       Communication   Communication: No difficulties  Cognition Arousal/Alertness: Awake/alert Behavior During Therapy: WFL for tasks assessed/performed Overall Cognitive Status: Within Functional Limits for tasks assessed  General Comments General comments (skin integrity, edema, etc.): Pt has Bledsoe brace locked in extension on R LE which was donned for OOB, Bledsoe Brace initially placed for R thigh hematoma, asked RN to contact Dr. August Saucer to see if there is still need after 4 weeks as it would aid in progressing mobility. Pt with [place] in place however O2 not on pt SaO2 98%O2 so  removed.         Assessment/Plan    PT Assessment Patient needs continued PT services  PT Problem List Decreased strength;Decreased range of motion;Decreased activity  tolerance;Decreased balance;Decreased mobility;Decreased coordination;Cardiopulmonary status limiting activity;Pain       PT Treatment Interventions Gait training;Functional mobility training;Therapeutic activities;Therapeutic exercise;Patient/family education;DME instruction    PT Goals (Current goals can be found in the Care Plan section)  Acute Rehab PT Goals Patient Stated Goal: to get in the chair PT Goal Formulation: With patient Time For Goal Achievement: 12/01/18 Potential to Achieve Goals: Good    Frequency Min 3X/week   Barriers to discharge        Co-evaluation PT/OT/SLP Co-Evaluation/Treatment: Yes Reason for Co-Treatment: For patient/therapist safety PT goals addressed during session: Mobility/safety with mobility OT goals addressed during session: ADL's and self-care       AM-PAC PT "6 Clicks" Mobility  Outcome Measure Help needed turning from your back to your side while in a flat bed without using bedrails?: A Little Help needed moving from lying on your back to sitting on the side of a flat bed without using bedrails?: A Lot Help needed moving to and from a bed to a chair (including a wheelchair)?: Total Help needed standing up from a chair using your arms (e.g., wheelchair or bedside chair)?: Total Help needed to walk in hospital room?: Total Help needed climbing 3-5 steps with a railing? : Total 6 Click Score: 9    End of Session Equipment Utilized During Treatment: Other (comment)(R LE Bledsoe brace) Activity Tolerance: Patient tolerated treatment well Patient left: in chair;with call bell/phone within reach Nurse Communication: Mobility status;Need for lift equipment PT Visit Diagnosis: Muscle weakness (generalized) (M62.81);Difficulty in walking, not elsewhere classified (R26.2);Pain Pain - part of body: (chest wall)    Time: 7915-0569 PT Time Calculation (min) (ACUTE ONLY): 38 min   Charges:   PT Evaluation $PT Eval Moderate Complexity: 1  Mod          Mailyn Steichen B. Beverely Risen PT, DPT Acute Rehabilitation Services Pager [phone] Office [phone]   Elon Alas Fleet 11/17/2018, 1:22 PM

## 2018-11-17 NOTE — Progress Notes (Addendum)
[address]       [name] [address]             [phone]         Subjective: Feeling reasonably well  Objective: Vital signs in last 24 hours: Temp:  [97.6 F (36.4 C)-97.7 F (36.5 C)] 97.6 F (36.4 C) (04/21 0638) Pulse Rate:  [54-83] 69 (04/21 3300) Cardiac Rhythm: Atrial fibrillation;Bundle branch block (04/20 1930) Resp:  [17-30] 20 (04/21 7622) BP: (96-105)/(62-74) 103/74 (04/21 6333) SpO2:  [94 %-100 %] 99 % (04/21 5456)  Hemodynamic parameters for last 24 hours:    Intake/Output from previous day: 04/20 0701 - 04/21 0700 In: 988 [P.O.:600; IV Piggyback:388] Out: 2225 [Urine:2025; Chest Tube:200] Intake/Output this shift: No intake/output data recorded.  General appearance: alert, cooperative and no distress Heart: irregularly irregular rhythm Lungs: dim right base, some crackles Abdomen: obese, soft, nontender Extremities: LE dressings in place Wound: CT site ok  Lab Results: Recent Labs    11/15/18 0527 11/16/18 0600  WBC 7.5 9.4  HGB 7.5* 7.4*  HCT 25.2* 24.2*  PLT 347 352   BMET: No results for input(s): NA, K, CL, CO2, GLUCOSE, BUN, CREATININE, CALCIUM in the last 72 hours.  PT/INR: No results for input(s): LABPROT, INR in the last 72 hours. ABG    Component Value Date/Time   PHART 7.461 (H) 10/09/2018 1047   HCO3 31.3 (H) 10/09/2018 1051   TCO2 33 (H) 10/09/2018 1051   O2SAT 76.7 10/26/2018 0410   CBG (last 3)  No results for input(s): GLUCAP in the last 72 hours.  Meds Scheduled Meds: . amiodarone  200 mg Oral BID  . apixaban  5 mg Oral BID  . carvedilol  3.125 mg Oral BID WC  . digoxin  0.125 mg Oral Daily  . feeding supplement (ENSURE ENLIVE)  237 mL Oral TID BM  . furosemide  40 mg Oral Daily  . pantoprazole  40 mg Oral BID AC  . polyethylene glycol  17 g Oral BID  . rosuvastatin  20 mg Oral q1800   Continuous Infusions: . ampicillin (OMNIPEN) IV 2 g (11/17/18 0554)   PRN Meds:.acetaminophen,  alum & mag hydroxide-simeth, lidocaine, sodium chloride flush  Xrays Dg Chest Port 1 View  Result Date: 11/16/2018 CLINICAL DATA:  Follow-up right pleural effusion EXAM: PORTABLE CHEST 1 VIEW COMPARISON:  11/15/2018 FINDINGS: Pigtail catheter is again noted on the right stable in appearance. Cardiac shadow remains enlarged. Left PICC line is again noted. Left lung is clear. Residual small pleural effusion on the right is noted. No significant change from the prior exam is noted. IMPRESSION: No change from the previous exam. Electronically Signed   By: Alcide Clever M.D.   On: 11/16/2018 08:10   Dg Chest Port 1 View  Result Date: 11/15/2018 CLINICAL DATA:  Evaluate for pneumothorax EXAM: PORTABLE CHEST 1 VIEW COMPARISON:  November 14, 2018 FINDINGS: A right chest tube remains in stable position. No pneumothorax. A left PICC line terminates in the SVC. Probable atelectasis in the incompletely evaluated left base. A right-sided pleural effusion with underlying opacity is not significantly changed in the interval. IMPRESSION: 1. No significant interval change. 2. Stable right chest tube with no pneumothorax. 3. Right pleural effusion with underlying opacity is similar in the interval. Electronically Signed   By: Gerome Sam III M.D   On: 11/15/2018 08:40   Results for orders placed or performed during the hospital encounter of 11/12/18  Culture, blood (Routine X 2) w Reflex to ID Panel     Status: None (Preliminary result)   Collection Time: 11/12/18  2:59 PM  Result Value Ref Range Status   Specimen Description BLOOD RIGHT ARM  Final   Special Requests   Final    BOTTLES DRAWN AEROBIC ONLY Blood Culture adequate volume   Culture   Final    NO GROWTH 4 DAYS Performed at [facility], [address], [place], [place] [address]    Report Status PENDING  Incomplete  Culture, blood (Routine X 2) w Reflex to ID Panel     Status: None (Preliminary result)   Collection Time: 11/12/18  3:05 PM   Result Value Ref Range Status   Specimen Description BLOOD LEFT HAND  Final   Special Requests   Final    BOTTLES DRAWN AEROBIC ONLY Blood Culture adequate volume   Culture   Final    NO GROWTH 4 DAYS Performed at [facility], [address], [place], [place] [address]    Report Status PENDING  Incomplete  Aerobic/Anaerobic Culture (surgical/deep wound)     Status: None   Collection Time: 11/12/18  6:02 PM  Result Value Ref Range Status   Specimen Description PLEURAL  Final   Special Requests RIGHT  Final   Gram Stain   Final    MODERATE WBC PRESENT, PREDOMINANTLY PMN RARE GRAM NEGATIVE RODS Performed at [facility], [address] [place], [place] [address]    Culture MODERATE ESCHERICHIA COLI  Final   Report Status 11/15/2018 FINAL  Final   Organism ID, Bacteria ESCHERICHIA COLI  Final      Susceptibility   Escherichia coli - MIC*    AMPICILLIN 8 SENSITIVE Sensitive     CEFAZOLIN <=4 SENSITIVE Sensitive     CEFEPIME <=1 SENSITIVE Sensitive     CEFTAZIDIME <=1 SENSITIVE Sensitive     CEFTRIAXONE <=1 SENSITIVE Sensitive     CIPROFLOXACIN <=0.25 SENSITIVE Sensitive     GENTAMICIN <=1 SENSITIVE Sensitive     IMIPENEM <=0.25 SENSITIVE Sensitive     TRIMETH/SULFA <=20 SENSITIVE Sensitive     AMPICILLIN/SULBACTAM <=2 SENSITIVE Sensitive     PIP/TAZO <=4 SENSITIVE Sensitive     Extended ESBL NEGATIVE Sensitive     * MODERATE ESCHERICHIA COLI  Expectorated sputum assessment w rflx to resp cult     Status: None   Collection Time: 11/12/18  8:24 PM  Result Value Ref Range Status   Specimen Description EXPECTORATED SPUTUM  Final   Special Requests NONE  Final   Sputum evaluation   Final    Sputum specimen not acceptable for testing.  Please recollect.   RESULT CALLED TO, READ BACK BY AND VERIFIED WITH: DAVIS AT 0736 ON [id number] BY SJW Performed at [facility], [address], [place], [place] [address]    Report Status 11/13/2018 FINAL  Final     Assessment/Plan:  1 hemodyn stable in rate controlled afib 2 no fevers 3 no new labs 4 CXR is stable, CT shows small air leak with cough, 210 cc out yesterday- cont same 5  conts ampicillin for E. Coli 6 medical management per primary  LOS: 5 days    Rowe Clack [facility] 11/17/2018 Pager [phone]  Patient seen and examined, agree with above Not sure what to make of air leak earlier- not present currently Drainage continues to taper off Will repeat CXR tomorrow  Viviann Spare C. Dorris Fetch,  MD [facility] [phone]

## 2018-11-17 NOTE — Progress Notes (Addendum)
Rehab Admissions Coordinator Note:  Patient was screened by Stephania Fragmin, PT, DPT for appropriateness for an Inpatient Acute Rehab Consult.  At this time, we are recommending Inpatient Rehab consult.  I will contact Dr. Alanda Slim for an order.   Stephania Fragmin, PT, DPT 11/17/2018, 12:46 PM  I can be reached at [phone].

## 2018-11-18 ENCOUNTER — Inpatient Hospital Stay (HOSPITAL_COMMUNITY): Payer: Medicaid Other

## 2018-11-18 LAB — HEMOGLOBIN AND HEMATOCRIT, BLOOD
HCT: 24.8 % — ABNORMAL LOW (ref 39.0–52.0)
Hemoglobin: 7.5 g/dL — ABNORMAL LOW (ref 13.0–17.0)

## 2018-11-18 NOTE — Progress Notes (Addendum)
This evening patient with more audibility in tubing of  Chest tube. Dressing had been changed and all connections checked. Rapid response made aware. Orders were received.  Dr. Rhona Leavens Made aware. Patient without complaints and is aysmtomatic bp 90/56 sats 100% on room air. Will await chest x ray results.  Will continue to monitor patient Aaron [name], Randall An RN

## 2018-11-18 NOTE — Progress Notes (Signed)
Inpatient Rehab Admissions:  Inpatient Rehab Consult received.  I met with patient at the bedside for rehabilitation assessment and to discuss goals and expectations of an inpatient rehab admission.  He is hoping to return to rehab once medical issues are resolved.  Will continue to follow for tolerance and medical readiness and readmit pending bed availability.   Signed:  , PT, DPT Admissions Coordinator [phone] 11/18/18  1:46 PM    

## 2018-11-18 NOTE — Progress Notes (Signed)
Physical Therapy Treatment Patient Details Name: Leanord Deitrich MRN: [id number] DOB: 01/27/1966 Today's Date: 11/18/2018    History of Present Illness 53 year old male with no significant past medical history until her recent prolonged hospitalization from 09/30/20/9/20 for new onset ischemic CHF (ICM), three-vessel CAD (not a candidate for CABG per CTS), new onset A. Fib, spontaneous right thigh hematoma and GI bleed.  His hospital course was complicated by a dynamic ileus/SBO requiring NG tube.  He was discharged to CIR, returned to acute with progressive weakness and dyspnea over the last 2 to 3 days. CT chest was obtained and concerning for empyema. Chest tube placement 4/16    PT Comments    Pt agreeable to therapy today as he has not utilized the tilt feature of the bed in many days. Pt limited during session by increased pain at chest tube site during tilt, however able to attain 50 degree tilt and perform mini squats to improve quad strength. Pt tolerated about 8 minutes of tilting before needing to change position due to pain. Pt left in chair position of the tilt bed. PT will continue to follow acutely until d/c'd back to CIR.     Follow Up Recommendations  CIR;Supervision/Assistance - 24 hour     Equipment Recommendations  Other (comment)(TBD)    Recommendations for Other Services       Precautions / Restrictions Precautions Precautions: Fall Precaution Comments: monitor BP Required Braces or Orthoses: Other Brace Other Brace: RLE Bledsoe brace locked in extension when up- okay to unlock at EOB  Restrictions Weight Bearing Restrictions: No RLE Weight Bearing: Weight bearing as tolerated LLE Weight Bearing: Weight bearing as tolerated    Mobility  Bed Mobility               General bed mobility comments: pt pushing with LEs as footboard drew inward to position himself higher in bed prior to elevating         Balance Overall balance assessment: Needs  assistance Sitting-balance support: Bilateral upper extremity supported Sitting balance-Leahy Scale: Fair Sitting balance - Comments: pt leaning toward L with bed in chair position, moving away from chest tube pain                                    Cognition Arousal/Alertness: Awake/alert Behavior During Therapy: WFL for tasks assessed/performed Overall Cognitive Status: Within Functional Limits for tasks assessed                                 General Comments: pt much more animated and talkative      Exercises Other Exercises Other Exercises: mini squats in Vital go bed, focus on quad activation     General Comments General comments (skin integrity, edema, etc.): Elevated pt with Vital Go tilt bed to 50 degrees, limited tolerance due to pain at chest tube site. BP monitored, 96/64      Pertinent Vitals/Pain Pain Assessment: Faces Faces Pain Scale: Hurts whole lot Pain Location: site of chest tube Pain Descriptors / Indicators: Burning Pain Intervention(s): Monitored during session;Limited activity within patient's tolerance;Repositioned           PT Goals (current goals can now be found in the care plan section) Acute Rehab PT Goals Patient Stated Goal: to get stronger PT Goal Formulation: With patient Time For Goal Achievement: 12/01/18 Potential to Achieve  Goals: Good Progress towards PT goals: Progressing toward goals    Frequency    Min 3X/week      PT Plan Current plan remains appropriate    Co-evaluation PT/OT/SLP Co-Evaluation/Treatment: Yes Reason for Co-Treatment: For patient/therapist safety PT goals addressed during session: Strengthening/ROM OT goals addressed during session: Strengthening/ROM      AM-PAC PT "6 Clicks" Mobility   Outcome Measure  Help needed turning from your back to your side while in a flat bed without using bedrails?: A Little Help needed moving from lying on your back to sitting on the  side of a flat bed without using bedrails?: A Lot Help needed moving to and from a bed to a chair (including a wheelchair)?: Total Help needed standing up from a chair using your arms (e.g., wheelchair or bedside chair)?: Total Help needed to walk in hospital room?: Total Help needed climbing 3-5 steps with a railing? : Total 6 Click Score: 9    End of Session Equipment Utilized During Treatment: Other (comment)(Vital go Tilt bed) Activity Tolerance: Patient tolerated treatment well Patient left: in bed;with call bell/phone within reach;with nursing/sitter in room Nurse Communication: Mobility status PT Visit Diagnosis: Muscle weakness (generalized) (M62.81);Difficulty in walking, not elsewhere classified (R26.2);Pain Pain - part of body: (chest tube site)     Time: 9150-5697 PT Time Calculation (min) (ACUTE ONLY): 35 min  Charges:  $Therapeutic Exercise: 8-22 mins                     Nayef College B. Beverely Risen PT, DPT Acute Rehabilitation Services Pager [phone] Office [phone]    Elon Alas [facility] 11/18/2018, 4:41 PM

## 2018-11-18 NOTE — Significant Event (Signed)
Rapid Response Event Note  Received a call from nurse with concern about "more audibility in tubing chest tube. Per nurse, patient was not distress, no desaturations, site was redressed by nurse as well.   I ordered CXR and asked nurse to inform the provider on call as well with the results.

## 2018-11-18 NOTE — Progress Notes (Signed)
Patient with blood pressures ranging 90-96 (SBP) this afternoon Dr. Rhona Leavens made aware and orders received. Will monitor patient .Latorsha Curling, Randall An rN

## 2018-11-18 NOTE — Progress Notes (Addendum)
      [address]       [name] [address]             [phone]           Subjective: Patient without specific complaints this am.  Objective: Vital signs in last 24 hours: Temp:  [97.5 F (36.4 C)-98.5 F (36.9 C)] 98.5 F (36.9 C) (04/22 0344) Pulse Rate:  [61-73] 73 (04/22 0344) Cardiac Rhythm: Atrial fibrillation (04/21 2338) Resp:  [18-19] 18 (04/22 0344) BP: (99-107)/(56-74) 102/68 (04/22 0344) SpO2:  [93 %-98 %] 93 % (04/22 0344)     Intake/Output from previous day: 04/21 0701 - 04/22 0700 In: -  Out: 1275 [Urine:1225; Chest Tube:50]   Physical Exam:  Cardiovascular: IRRR IRRR Pulmonary: Breath sounds slightly diminished on the right Wounds: Dressing is clean and dry.   Chest Tube: to suction, no air leak (purulent drainage)  Lab Results: CBC: Recent Labs    11/16/18 0600 11/17/18 0500 11/18/18 0500  WBC 9.4  --   --   HGB 7.4* 7.7* 7.5*  HCT 24.2* 26.4* 24.8*  PLT 352  --   --    BMET:  No results for input(s): NA, K, CL, CO2, GLUCOSE, BUN, CREATININE, CALCIUM in the last 72 hours.  PT/INR:  No results for input(s): LABPROT, INR in the last 72 hours. ABG:  INR: Will add last result for INR, ABG once components are confirmed Will add last 4 CBG results once components are confirmed  Assessment/Plan: Pulmonary - S/p 14 French pigtail tube 04/16 and thrombolytics yesterday. On 1-2 liter of oxygen via [place].  Chest tube is to suction and no air leak. Chest tube with 50 cc last 24 hours. Hope to remove pigtail chest tube soon. Right pleural fluid culture showed E. Coli.   CXR appears stable. ID-on Ampicillin   Donielle M ZimmermanPA-C 11/18/2018,7:24 AM [phone]  Patient seen and examined, agree with above CT output is minimal and CXR looks OK He currently has a small intermittent air leak, may have a small BPF. System inspected with no apparent source of a leak  Keep CT for now  [place] C. Dorris Fetch, MD [facility] [phone]'

## 2018-11-18 NOTE — Progress Notes (Signed)
Occupational Therapy Treatment Patient Details Name: Aaron [name] MRN: [id number] DOB: [birth year] Today's Date: 11/18/2018    History of present illness 53 year old male with no significant past medical history until her recent prolonged hospitalization from 09/30/20/9/20 for new onset ischemic CHF (ICM), three-vessel CAD (not a candidate for CABG per CTS), new onset A. Fib, spontaneous right thigh hematoma and GI bleed.  His hospital course was complicated by a dynamic ileus/SBO requiring NG tube.  He was discharged to CIR, returned to acute with progressive weakness and dyspnea over the last 2 to 3 days. CT chest was obtained and concerning for empyema. Chest tube placement 4/16   OT comments  Pt remains highly motivated to work with therapies toward improving mobility and strength, but limited by pain at chest tube site with standing using Vital Go tilt bed. Pt only tolerating to 50 degrees this visit, VS monitored throughout with BP of 96/64, pt without complaints of dizziness. Left level 2 theraband for UE strengthening between therapy sessions. Continue to recommend return to CIR for continued therapy.  **Awaiting response from Dr. August Saucer regarding continued need for Bledsoe brace on L LE.**  Follow Up Recommendations  CIR;Supervision/Assistance - 24 hour    Equipment Recommendations  Other (comment)(defer to next venue)    Recommendations for Other Services      Precautions / Restrictions Precautions Precautions: Fall Precaution Comments: monitor BP Required Braces or Orthoses: Other Brace Other Brace: RLE Bledsoe brace locked in extension when up- okay to unlock at EOB  Restrictions Weight Bearing Restrictions: No RLE Weight Bearing: Weight bearing as tolerated LLE Weight Bearing: Weight bearing as tolerated       Mobility Bed Mobility               General bed mobility comments: pt pushing with LEs as footboard drew inward to position himself higher in bed prior to  elevating  Transfers                      Balance   Sitting-balance support: Bilateral upper extremity supported Sitting balance-Leahy Scale: Fair Sitting balance - Comments: pt leaning toward L with bed in chair position, moving away from chest tube pain                                   ADL either performed or assessed with clinical judgement   ADL                       Lower Body Dressing: Total assistance;Bed level(for Bledsoe brace)                       Vision       Perception     Praxis      Cognition Arousal/Alertness: Awake/alert Behavior During Therapy: WFL for tasks assessed/performed Overall Cognitive Status: Within Functional Limits for tasks assessed                                 General Comments: pt much more animated and talkative        Exercises Other Exercises Other Exercises: Left level 2 theraband for pt to do UB exercises on his own.   Shoulder Instructions       General Comments Elevated pt with Vital Go tilt bed to 50 degrees,  limited tolerance due to pain at chest tube site. BP monitored, 96/64    Pertinent Vitals/ Pain       Pain Assessment: Faces Faces Pain Scale: Hurts whole lot Pain Location: site of chest tube Pain Descriptors / Indicators: Burning Pain Intervention(s): Monitored during session;Repositioned  Home Living                                          Prior Functioning/Environment              Frequency  Min 3X/week        Progress Toward Goals  OT Goals(current goals can now be found in the care plan section)  Progress towards OT goals: Progressing toward goals  Acute Rehab OT Goals Patient Stated Goal: to get stronger OT Goal Formulation: With patient Time For Goal Achievement: 12/01/18 Potential to Achieve Goals: Good  Plan Discharge plan remains appropriate    Co-evaluation    PT/OT/SLP Co-Evaluation/Treatment:  Yes Reason for Co-Treatment: For patient/therapist safety   OT goals addressed during session: Strengthening/ROM      AM-PAC OT "6 Clicks" Daily Activity     Outcome Measure   Help from another person eating meals?: None Help from another person taking care of personal grooming?: A Little Help from another person toileting, which includes using toliet, bedpan, or urinal?: Total Help from another person bathing (including washing, rinsing, drying)?: A Lot Help from another person to put on and taking off regular upper body clothing?: A Lot Help from another person to put on and taking off regular lower body clothing?: Total 6 Click Score: 13    End of Session    OT Visit Diagnosis: Unsteadiness on feet (R26.81);Other abnormalities of gait and mobility (R26.89);Muscle weakness (generalized) (M62.81);Pain   Activity Tolerance Patient limited by pain   Patient Left with call bell/phone within reach;in bed;with nursing/sitter in room(bed in chair position)   Nurse Communication Other (comment)(RN working on seal of chest tube at end of session)        Time: 1500-1535 OT Time Calculation (min): 35 min  Charges: OT General Charges $OT Visit: 1 Visit OT Treatments $Therapeutic Activity: 8-22 mins  Martie Round, OTR/L Acute Rehabilitation Services Pager: [phone] Office: [phone] Evern Bio 11/18/2018, 3:55 PM

## 2018-11-18 NOTE — Progress Notes (Signed)
PROGRESS NOTE    Aaron [name]  EAV:[id number] DOB: [birth year] DOA: 11/12/2018 PCP: [facility] [place]    Brief Narrative:  53 year old male with no significant past medical history until her recent prolonged hospitalization from 09/30/20/9/20 for new onset ischemic CHF (ICM), three-vessel CAD (not a candidate for CABG per CTS), new onset A. Fib, spontaneous right thigh hematoma and GI bleed.  His hospital course was complicated by a dynamic ileus/SBO requiring NG tube.  He was discharged to CIR, and returns with progressive weakness and dyspnea over the last 2 to 3 days.  He had CXR that was concerning for hydro-versus hemothorax for which he was admitted.  CT chest was obtained and concerning for empyema.  Cardiothoracic surgery consulted by admitting provider and recommended pigtail placement by IR for potential thrombolytics in few days.  He was also started on Vanco and Zosyn for possible HAP/parapneumonic effusion.  Patient had chest tube placed later the day of admission.  Also has had thrombolytics.  Chest tube drained about 4 L of purulent material since then.   Culture grew pansensitive E. Coli.  Transitioned to ampicillin on 11/15/2018.  Assessment & Plan:   Active Problems:   Acute systolic CHF (congestive heart failure) (HCC)   Acute hypoxemic respiratory failure (HCC)   Morbid obesity (HCC)   Atrial fibrillation with rapid ventricular response (HCC)   Hypokalemia   Protein-calorie malnutrition, severe   Hematoma of right thigh   Acute blood loss anemia   Orthostatic hypotension   Empyema (HCC)  Dyspnea/fatigue-likely due to pleural effusion/parapneumonic effusion-improving. -Status post chest tube placement by IR on 4/16 and thrombolytics.  -Chest tube drained 200 cc in the last 24-hour.  About 4 L so far. -No fluid cytology or chemistry sent.   -Culture grew pansensitive E. Coli.  Blood cultures NGTD -Vancomycin and Zosyn for HAP-4/16-4/19  -Continued on IV ampicillin 4/19-- -IR and CTS following -Per CTS, continue chest tube today  Chronic systolic CHF/ICM: Echo with EF of 13%.  Appears euvolemic.  No cardiopulmonary symptoms at this time -Continue home Lasix-good urine output. -ARB on hold due to soft blood pressures. -Daily weight, intake output and renal function.  History of three-vessel CAD: not a candidate for CABG per CTS during previous hospitalization-stable. -Continue home medications (low-dose Coreg and Crestor) as tolerated  History of blood loss anemia/GI bleed: Hemoglobin from 7.1-7.7 -We will transfuse if below 7. -Continue with PPI as tolerated. Stable for now  Paroxysmal A. fib without RVR -Continue home Eliquis, amiodarone, digoxin and Coreg  Elevated liver enzymes: No history of liver disease.  CT on 3/23 did not show any hepatobiliary abnormalities. He is on amiodarone and Crestor which could contribute.  Stable. -We will continue amiodarone and Crestor as liver enzymes seems to be stable.  Benefit outweighs the risk. -We will check liver enzymes intermittently. -Stable at present  Protein calorie malnutrition: BMI 36.  Due to chronic illness. -Appreciate dietitian input-continue supplements.  DVT prophylaxis: Eliquis Code Status: Full Family Communication: Pt in room, family not at bedside Disposition Plan: CIR possible after chest tube removed and when cleared by CTS  Consultants:   CTS  IR  Procedures:   Chest tube 4/16  Antimicrobials: Anti-infectives (From admission, onward)   Start     Dose/Rate Route Frequency Ordered Stop   11/15/18 1200  ampicillin (OMNIPEN) 2 g in sodium chloride 0.9 % 100 mL IVPB     2 g 300 mL/hr over 20 Minutes Intravenous Every 6 hours  11/15/18 1120     11/13/18 0600  vancomycin (VANCOCIN) 1,750 mg in sodium chloride 0.9 % 500 mL IVPB  Status:  Discontinued     1,750 mg 250 mL/hr over 120 Minutes Intravenous Every 12 hours 11/12/18 1553  11/15/18 1120   11/12/18 1630  vancomycin (VANCOCIN) 2,000 mg in sodium chloride 0.9 % 500 mL IVPB  Status:  Discontinued     2,000 mg 250 mL/hr over 120 Minutes Intravenous  Once 11/12/18 1532 11/12/18 1537   11/12/18 1630  vancomycin (VANCOCIN) 2,500 mg in sodium chloride 0.9 % 500 mL IVPB     2,500 mg 250 mL/hr over 120 Minutes Intravenous  Once 11/12/18 1537 11/12/18 2035   11/12/18 1600  piperacillin-tazobactam (ZOSYN) IVPB 3.375 g  Status:  Discontinued     3.375 g 12.5 mL/hr over 240 Minutes Intravenous Every 8 hours 11/12/18 1527 11/15/18 1120       Subjective: Reports feeling better  Objective: Vitals:   11/17/18 1943 11/17/18 2342 11/18/18 0344 11/18/18 0835  BP: (!) 101/56 107/74 102/68 102/67  Pulse: 64 61 73 70  Resp:  19 18   Temp: 98.3 F (36.8 C) 97.6 F (36.4 C) 98.5 F (36.9 C)   TempSrc: Oral Oral Oral   SpO2: 98% 98% 93%   Weight:        Intake/Output Summary (Last 24 hours) at 11/18/2018 1149 Last data filed at 11/18/2018 0847 Gross per 24 hour  Intake -  Output 1775 ml  Net -1775 ml   Filed Weights   11/14/18 1802  Weight: 131.8 kg    Examination:  General exam: Appears calm and comfortable  Respiratory system: Clear to auscultation. Respiratory effort normal. Cardiovascular system: S1 & S2 heard, RRR Gastrointestinal system: Abdomen is nondistended, soft and nontender. No organomegaly or masses felt. Normal bowel sounds heard. Obese Central nervous system: Alert and oriented. No focal neurological deficits. Extremities: Symmetric 5 x 5 power. Skin: No rashes, lesions Psychiatry: Judgement and insight appear normal. Mood & affect appropriate.   Data Reviewed: I have personally reviewed following labs and imaging studies  CBC: Recent Labs  Lab 11/12/18 1047 11/13/18 0320 11/14/18 0600 11/15/18 0527 11/16/18 0600 11/17/18 0500 11/18/18 0500  WBC 8.8 8.7 9.3 7.5 9.4  --   --   HGB 7.2* 7.1* 7.7* 7.5* 7.4* 7.7* 7.5*  HCT 23.9* 23.4*  24.8* 25.2* 24.2* 26.4* 24.8*  MCV 93.0 91.1 90.5 91.3 91.7  --   --   PLT 341 330 360 347 352  --   --    Basic Metabolic Panel: Recent Labs  Lab 11/13/18 0320 11/14/18 0600 11/15/18 0527  NA 138 139  --   K 3.7 4.1  --   CL 99 98  --   CO2 31 32  --   GLUCOSE 123* 102*  --   BUN 21* 18  --   CREATININE 0.86 0.82  --   CALCIUM 7.8* 8.1*  --   MG  --  2.2 2.2   GFR: Estimated Creatinine Clearance: 152.7 mL/min (by C-G formula based on SCr of 0.82 mg/dL). Liver Function Tests: Recent Labs  Lab 11/13/18 0320 11/14/18 0600  AST 79* 53*  ALT 98* 88*  ALKPHOS 123 118  BILITOT 0.7 1.0  PROT 5.3* 5.8*  ALBUMIN 1.4* 1.4*   No results for input(s): LIPASE, AMYLASE in the last 168 hours. No results for input(s): AMMONIA in the last 168 hours. Coagulation Profile: Recent Labs  Lab 11/12/18 1826  INR  1.2   Cardiac Enzymes: No results for input(s): CKTOTAL, CKMB, CKMBINDEX, TROPONINI in the last 168 hours. BNP (last 3 results) No results for input(s): PROBNP in the last 8760 hours. HbA1C: No results for input(s): HGBA1C in the last 72 hours. CBG: No results for input(s): GLUCAP in the last 168 hours. Lipid Profile: No results for input(s): CHOL, HDL, LDLCALC, TRIG, CHOLHDL, LDLDIRECT in the last 72 hours. Thyroid Function Tests: No results for input(s): TSH, T4TOTAL, FREET4, T3FREE, THYROIDAB in the last 72 hours. Anemia Panel: No results for input(s): VITAMINB12, FOLATE, FERRITIN, TIBC, IRON, RETICCTPCT in the last 72 hours. Sepsis Labs: No results for input(s): PROCALCITON, LATICACIDVEN in the last 168 hours.  Recent Results (from the past 240 hour(s))  Culture, blood (Routine X 2) w Reflex to ID Panel     Status: None   Collection Time: 11/12/18  2:59 PM  Result Value Ref Range Status   Specimen Description BLOOD RIGHT ARM  Final   Special Requests   Final    BOTTLES DRAWN AEROBIC ONLY Blood Culture adequate volume   Culture   Final    NO GROWTH 5 DAYS  Performed at [facility], [address] [place], [place] [address]    Report Status 11/17/2018 FINAL  Final  Culture, blood (Routine X 2) w Reflex to ID Panel     Status: None   Collection Time: 11/12/18  3:05 PM  Result Value Ref Range Status   Specimen Description BLOOD LEFT HAND  Final   Special Requests   Final    BOTTLES DRAWN AEROBIC ONLY Blood Culture adequate volume   Culture   Final    NO GROWTH 5 DAYS Performed at [facility], [address] [place], [place] [address]    Report Status 11/17/2018 FINAL  Final  Aerobic/Anaerobic Culture (surgical/deep wound)     Status: None   Collection Time: 11/12/18  6:02 PM  Result Value Ref Range Status   Specimen Description PLEURAL  Final   Special Requests RIGHT  Final   Gram Stain   Final    MODERATE WBC PRESENT, PREDOMINANTLY PMN RARE GRAM NEGATIVE RODS    Culture   Final    MODERATE ESCHERICHIA COLI NO ANAEROBES ISOLATED Performed at [facility], [address] [place], [place] [address]    Report Status 11/15/2018 FINAL  Final   Organism ID, Bacteria ESCHERICHIA COLI  Final      Susceptibility   Escherichia coli - MIC*    AMPICILLIN 8 SENSITIVE Sensitive     CEFAZOLIN <=4 SENSITIVE Sensitive     CEFEPIME <=1 SENSITIVE Sensitive     CEFTAZIDIME <=1 SENSITIVE Sensitive     CEFTRIAXONE <=1 SENSITIVE Sensitive     CIPROFLOXACIN <=0.25 SENSITIVE Sensitive     GENTAMICIN <=1 SENSITIVE Sensitive     IMIPENEM <=0.25 SENSITIVE Sensitive     TRIMETH/SULFA <=20 SENSITIVE Sensitive     AMPICILLIN/SULBACTAM <=2 SENSITIVE Sensitive     PIP/TAZO <=4 SENSITIVE Sensitive     Extended ESBL NEGATIVE Sensitive     * MODERATE ESCHERICHIA COLI  Expectorated sputum assessment w rflx to resp cult     Status: None   Collection Time: 11/12/18  8:24 PM  Result Value Ref Range Status   Specimen Description EXPECTORATED SPUTUM  Final   Special Requests NONE  Final   Sputum evaluation   Final    Sputum specimen not  acceptable for testing.  Please recollect.   RESULT  CALLED TO, READ BACK BY AND VERIFIED WITH: DAVIS AT 0736 ON [id number] BY SJW Performed at [facility], [address] [place], [place] [address]    Report Status 11/13/2018 FINAL  Final     Radiology Studies: Dg Chest 2 View  Result Date: 11/18/2018 CLINICAL DATA:  RIGHT chest tube in place for empyema. EXAM: CHEST - 2 VIEW COMPARISON:  11/17/2018 and earlier. FINDINGS: AP SEMI-ERECT and LATERAL images were obtained. Cardiac silhouette moderately enlarged, unchanged. Pulmonary vascularity normal without evidence of pulmonary edema. Pigtail catheter in the RIGHT pleural space with residual small to moderate-sized RIGHT pleural effusion/empyema, unchanged over the past 4-5 days. Consolidation in the RIGHT LOWER LOBE, RIGHT MIDDLE LOBE and LEFT LOWER LOBE, also unchanged. No new pulmonary parenchymal abnormalities. IMPRESSION: 1. Stable small to moderate-sized RIGHT pleural effusion/empyema. 2. Stable atelectasis and/or pneumonia involving the RIGHT LOWER LOBE, RIGHT MIDDLE LOBE and LEFT LOWER LOBE. 3. Stable cardiomegaly without pulmonary edema. 4. No new abnormalities. Electronically Signed   By: Hulan Saas M.D.   On: 11/18/2018 08:19   Dg Chest Port 1 View  Result Date: 11/17/2018 CLINICAL DATA:  53 year old male with a history of chest tube placement EXAM: PORTABLE CHEST 1 VIEW COMPARISON:  11/16/2018 FINDINGS: Cardiomediastinal silhouette unchanged. Opacity at the right lung is similar with pleuroparenchymal thickening, airspace opacity at the right lung base, partial obscuration the right hemidiaphragm. Unchanged left upper extremity PICC, appearing to terminate superior vena cava. Unchanged right thoracostomy pigtail drainage catheter. No pneumothorax. IMPRESSION: Unchanged right-sided thoracostomy tube, with persistent pleuroparenchymal thickening and right basilar opacity, compatible with residual pleural fluid/thickening and basilar  atelectasis/consolidation. Unchanged left upper extremity PICC Electronically Signed   By: Gilmer Mor D.O.   On: 11/17/2018 08:35    Scheduled Meds: . amiodarone  200 mg Oral Daily  . apixaban  5 mg Oral BID  . carvedilol  3.125 mg Oral BID WC  . digoxin  0.125 mg Oral Daily  . feeding supplement (ENSURE ENLIVE)  237 mL Oral TID BM  . furosemide  40 mg Oral Daily  . pantoprazole  40 mg Oral BID AC  . polyethylene glycol  17 g Oral BID  . rosuvastatin  20 mg Oral q1800   Continuous Infusions: . ampicillin (OMNIPEN) IV 2 g (11/18/18 0545)     LOS: 6 days   Rickey Barbara, MD [facility] Pager On Amion  If 7PM-7AM, please contact night-coverage 11/18/2018, 11:49 AM

## 2018-11-18 NOTE — Progress Notes (Signed)
[name], male   DOB: 1966-05-10, 53 y.o.   MRN: [id number]   IR Rounding note via phone  Due to new restrictions  Rt chest empyema Drain placed 4/16 in IR  Placed to water seal 4/20 am per Dr Hendrickson  Afeb 50 cc OP in pleurvac overnight No air leak per RN at this point  Note reflects poss pull tomorrow CXR today: IMPRESSION: 1. Stable small to moderate-sized RIGHT pleural effusion/empyema. 2. Stable atelectasis and/or pneumonia involving the RIGHT LOWER LOBE, RIGHT MIDDLE LOBE and LEFT LOWER LOBE. 3. Stable cardiomegaly without pulmonary edema. 4. No new abnormalities.  Plan per Dr Dorris Fetch

## 2018-11-19 ENCOUNTER — Encounter (HOSPITAL_COMMUNITY): Payer: Self-pay | Admitting: General Practice

## 2018-11-19 ENCOUNTER — Inpatient Hospital Stay (HOSPITAL_COMMUNITY): Payer: Medicaid Other

## 2018-11-19 LAB — BASIC METABOLIC PANEL
Anion gap: 8 (ref 5–15)
BUN: 18 mg/dL (ref 6–20)
CO2: 26 mmol/L (ref 22–32)
Calcium: 8.2 mg/dL — ABNORMAL LOW (ref 8.9–10.3)
Chloride: 104 mmol/L (ref 98–111)
Creatinine, Ser: 0.98 mg/dL (ref 0.61–1.24)
GFR calc Af Amer: >60 mL/min (ref >60)
GFR calc non Af Amer: >60 mL/min (ref >60)
Glucose, Bld: 122 mg/dL — ABNORMAL HIGH (ref 70–99)
Potassium: 3.8 mmol/L (ref 3.5–5.1)
Sodium: 138 mmol/L (ref 135–145)

## 2018-11-19 LAB — CBC
HCT: 24.8 % — ABNORMAL LOW (ref 39.0–52.0)
Hemoglobin: 7.5 g/dL — ABNORMAL LOW (ref 13.0–17.0)
MCH: 28.7 pg (ref 26.0–34.0)
MCHC: 30.2 g/dL (ref 30.0–36.0)
MCV: 95 fL (ref 80.0–100.0)
Platelets: 276 10*3/uL (ref 150–400)
RBC: 2.61 MIL/uL — ABNORMAL LOW (ref 4.22–5.81)
RDW: 18.9 % — ABNORMAL HIGH (ref 11.5–15.5)
WBC: 10.7 10*3/uL — ABNORMAL HIGH (ref 4.0–10.5)
nRBC: 0 % (ref 0.0–0.2)

## 2018-11-19 LAB — HEMOGLOBIN AND HEMATOCRIT, BLOOD
HCT: 24.1 % — ABNORMAL LOW (ref 39.0–52.0)
Hemoglobin: 7.4 g/dL — ABNORMAL LOW (ref 13.0–17.0)

## 2018-11-19 NOTE — Progress Notes (Signed)
Orthopedic Tech Progress Note Patient Details:  Niv Luisi [birth year] [id number]  CPM Right Knee CPM Right Knee: On Right Knee Flexion (Degrees): 90 Right Knee Extension (Degrees): 0  Post Interventions Patient Tolerated: Well Instructions Provided: Care of device, Adjustment of device  [name] 11/19/2018, 10:14 AM

## 2018-11-19 NOTE — Progress Notes (Signed)
Physical Therapy Treatment Patient Details Name: Aaron [name] MRN: [id number] DOB: May 12, 1966 Today's Date: 11/19/2018    History of Present Illness (P) 53 year old male with no significant past medical history until her recent prolonged hospitalization from 09/30/20/9/20 for new onset ischemic CHF (ICM), three-vessel CAD (not a candidate for CABG per CTS), new onset A. Fib, spontaneous right thigh hematoma and GI bleed.  His hospital course was complicated by a dynamic ileus/SBO requiring NG tube.  He was discharged to CIR, returned to acute with progressive weakness and dyspnea over the last 2 to 3 days. CT chest was obtained and concerning for empyema. Chest tube placement 4/16    PT Comments    Pt admitted with above diagnosis. Pt currently with functional limitations due to balance and endurance deficits. Pt was able to tolerate standing x 11 min in tilt bed at 50 degrees.  VSS. Will follow acutely. Pt will benefit from skilled PT to increase their independence and safety with mobility to allow discharge to the venue listed below.    Follow Up Recommendations  (P) CIR;Supervision/Assistance - 24 hour     Equipment Recommendations  (P) Other (comment)(TBD)    Recommendations for Other Services       Precautions / Restrictions Precautions Precautions: (P) Fall, chest tube Precaution Comments: (P) monitor BP Required Braces or Orthoses: (P) Other Brace Other Brace: (P) RLE Bledsoe brace locked in extension when up- okay to unlock at EOB  Restrictions Weight Bearing Restrictions: (P) No RLE Weight Bearing: (P) Weight bearing as tolerated LLE Weight Bearing: (P) Weight bearing as tolerated    Mobility  Bed Mobility Overal bed mobility: (P) Needs Assistance Bed Mobility: (P) Rolling Rolling: (P) Min assist   Supine to sit: (P) Mod assist;+2 for physical assistance Sit to supine: (P) Mod assist;+2 for physical assistance   General bed mobility comments: (P) pt pushing with LEs  as footboard drew inward to position himself higher in bed prior to elevating  Transfers Overall transfer level: (P) Needs assistance Equipment used: (P) (Vital Go tilt bed) Transfers: (P) Sit to/from Stand Sit to Stand: (P) Total assist;+2 physical assistance;Max assist         General transfer comment: (P) Tolerated tilt at 50 degrees for 11 minutes.  Actively shifting weight and performing quad sets and glut sets.  Ambulation/Gait                 Stairs             Wheelchair Mobility    Modified Rankin (Stroke Patients Only)       Balance Overall balance assessment: (P) Needs assistance Sitting-balance support: (P) Bilateral upper extremity supported Sitting balance-Leahy Scale: (P) Fair Sitting balance - Comments: (P) pt leaning toward L with bed in chair position, moving away from chest tube pain                                    Cognition Arousal/Alertness: (P) Awake/alert Behavior During Therapy: (P) WFL for tasks assessed/performed Overall Cognitive Status: (P) Within Functional Limits for tasks assessed                                 General Comments: (P) pt much more animated and talkative      Exercises General Exercises - Lower Extremity Ankle Circles/Pumps: (P) AROM;Both;Supine;20 reps Quad Sets: (P)  AROM;Both;10 reps;Supine Gluteal Sets: (P) AROM;Both;15 reps;Standing Heel Slides: (P) AAROM;Both;Supine;5 reps Other Exercises Other Exercises: (P) mini squats in Vital go bed, focus on quad activation  Other Exercises: (P) neck rotation R and L Other Exercises: (P) Bilateral hip/trunk extension while tilted    General Comments General comments (skin integrity, edema, etc.): (P) VSS      Pertinent Vitals/Pain Pain Assessment: (P) Faces Faces Pain Scale: (P) Hurts whole lot Pain Location: (P) site of chest tube Pain Descriptors / Indicators: (P) Burning Pain Intervention(s): (P) Limited activity within  patient's tolerance;Monitored during session;Repositioned    Home Living                      Prior Function            PT Goals (current goals can now be found in the care plan section) Acute Rehab PT Goals Patient Stated Goal: (P) to get stronger Progress towards PT goals: (P) Progressing toward goals    Frequency    (P) Min 3X/week      PT Plan (P) Current plan remains appropriate    Co-evaluation              AM-PAC PT "6 Clicks" Mobility   Outcome Measure  Help needed turning from your back to your side while in a flat bed without using bedrails?: (P) A Little Help needed moving from lying on your back to sitting on the side of a flat bed without using bedrails?: (P) A Lot Help needed moving to and from a bed to a chair (including a wheelchair)?: (P) Total Help needed standing up from a chair using your arms (e.g., wheelchair or bedside chair)?: (P) Total Help needed to walk in hospital room?: (P) Total Help needed climbing 3-5 steps with a railing? : (P) Total 6 Click Score: (P) 9    End of Session Equipment Utilized During Treatment: (P) Other (comment)(Vital go Tilt bed) Activity Tolerance: (P) Patient tolerated treatment well Patient left: (P) in bed;with call bell/phone within reach Nurse Communication: (P) Mobility status PT Visit Diagnosis: (P) Muscle weakness (generalized) (M62.81);Difficulty in walking, not elsewhere classified (R26.2);Pain Pain - part of body: (P) (chest tube site)     Time: (P) 1008-(P) 1044 PT Time Calculation (min) (ACUTE ONLY): (P) 36 min  Charges:  $Therapeutic Exercise: (P) 8-22 mins $Therapeutic Activity: (P) 8-22 mins                     Twania Bujak,PT Acute Rehabilitation Services Pager:  [phone]  Office:  [phone]     Berline Lopes 11/19/2018, 1:35 PM

## 2018-11-19 NOTE — Progress Notes (Addendum)
      [address]       [place] [address]             [phone]      Subjective:  No specific complaints.  Denies chest pain and shortness of breath.  Objective: Vital signs in last 24 hours: Temp:  [97.6 F (36.4 C)-98.1 F (36.7 C)] 97.7 F (36.5 C) (04/23 0350) Pulse Rate:  [33-73] 58 (04/23 0350) Cardiac Rhythm: Atrial fibrillation (04/23 0350) Resp:  [12-30] 23 (04/23 0350) BP: (90-113)/(53-72) 101/66 (04/23 0350) SpO2:  [95 %-100 %] 95 % (04/23 0350)  Intake/Output from previous day: 04/22 0701 - 04/23 0700 In: 1600 [P.O.:600; IV Piggyback:1000] Out: 2170 [Urine:2050; Chest Tube:120]  General appearance: alert, cooperative and no distress Heart: irregularly irregular rhythm Lungs: diminished breath sounds right base Abdomen: soft, non-tender; bowel sounds normal; no masses,  no organomegaly Extremities: extremities normal, atraumatic, no cyanosis or edema Wound: clean and dry  Lab Results: Recent Labs    11/18/18 0500 11/19/18 0346  HGB 7.5* 7.4*  HCT 24.8* 24.1*   BMET: No results for input(s): NA, K, CL, CO2, GLUCOSE, BUN, CREATININE, CALCIUM in the last 72 hours.  PT/INR: No results for input(s): LABPROT, INR in the last 72 hours. ABG    Component Value Date/Time   PHART 7.461 (H) 10/09/2018 1047   HCO3 31.3 (H) 10/09/2018 1051   TCO2 33 (H) 10/09/2018 1051   O2SAT 76.7 10/26/2018 0410   CBG (last 3)  No results for input(s): GLUCAP in the last 72 hours.  Assessment/Plan:  1. CV- A. Fib, chronic on Eliquis 2. Pulm- nursing called rapid response because of increased air sounds in chest tube tubing, CXR remains stable, no significant pneumothorax present, CT output remains low, intermittent air leak.... felt to be possibly due to BPF, leave chest tube in place on water seal 3. ID- OR cultures growing E.Coli... continue ABX per primary 4. Repeat CXR in AM, continue ABX, leave chest tube in place   LOS: 7 days    Lowella Dandy PA-C [phone]   11/19/2018

## 2018-11-19 NOTE — Progress Notes (Signed)
[name], male   DOB: 1965-08-03, 53 y.o.   MRN: [id number]    IR Rounding note via phone  Due to new restrictions  Rt chest empyema Drain placed 4/16 in IR  Placed to water seal 4/20 am per Dr Alexis Goodell No air leak per RN at this point Site is clean and dry  CXR today:  IMPRESSION: Stable chest x-ray demonstrating small amount of residual right pleural fluid and dense consolidation of the right lower lung.   Plan per Dr Dorris Fetch

## 2018-11-19 NOTE — Progress Notes (Signed)
PROGRESS NOTE    Maxim Bedel  WUJ:[id number] DOB: 07/07/1966 DOA: 11/12/2018 PCP: [facility] [place]   Brief Narrative:  53 year old male with no significant past medical history until her recent prolonged hospitalization from 09/30/20/9/20 for new onset ischemic CHF (ICM), three-vessel CAD (not Kavin Weckwerth candidate for CABG per CTS), new onset [place]. Fib, spontaneous right thigh hematoma and GI bleed. His hospital course was complicated by Keeton Kassebaum dynamic ileus/SBO requiring NG tube. He was discharged to CIR, and returns with progressive weakness and dyspnea over the last 2 to 3 days.  He had CXR that was concerning for hydro-versus hemothorax for which he was admitted. CT chest was obtained and concerning for empyema. Cardiothoracic surgery consulted by admitting provider and recommended pigtail placement by IR for potential thrombolytics in few days. He was also started on Vanco and Zosyn for possible HAP/parapneumonic effusion.  Patient had chest tube placed later the day of admission.Also has had thrombolytics. Chest tube drained about 4 L of purulent material since then. Culture grew pansensitive E. Coli. Transitioned to ampicillin on 11/15/2018.   Assessment & Plan:   Active Problems:   Acute systolic CHF (congestive heart failure) (HCC)   Acute hypoxemic respiratory failure (HCC)   Morbid obesity (HCC)   Atrial fibrillation with rapid ventricular response (HCC)   Hypokalemia   Protein-calorie malnutrition, severe   Hematoma of right thigh   Acute blood loss anemia   Orthostatic hypotension   Empyema (HCC)   Right Sided Empyema  Dyspnea/fatigue  - likely due to pleural effusion/parapneumonic effusion-improving. -Status post chest tube placement by IR on 4/16 and thrombolytics.  -Chest tube drained~120 ccin the last 24-hour.  -No fluid cytology or chemistry sent.  -Culture grew pansensitive E. Coli. Blood cultures NGTD -Vancomycin and Zosyn for  HAP-4/16-4/19 -Continued on IV ampicillin 4/19-- -IR and CTS following -Per CTS, continue chest tube today, will follow up CXR in AM  Chronic systolic CHF/ICM: Echo with EF of 15%. Appears euvolemic.No cardiopulmonary symptoms at this time -Continue home Lasix-good urine output. -ARB on hold due tosoft blood pressures. -Daily weight, intake output and renal function.  History of three-vessel CAD:not Azaiah Mello candidate for CABG per CTSduring previous hospitalization-stable. -Continue home medications (low-dose Coreg and Crestor) as tolerated  History of blood loss anemia/GI bleed: Hemoglobin from 7.1-7.7 -We will transfuse if below 7. -Continue with PPI as tolerated. Stable for now  Paroxysmal Moises Terpstra. fib without RVR -Continue home Eliquis, amiodarone, digoxin and Coreg - Per cards, amiodarone reduced to 200 mg daily.  Please f/u with cardiology when nearing discharge to ensure follow up.  Elevated liver enzymes: No history of liver disease. CT on 3/23 did not show any hepatobiliary abnormalities. He is on amiodarone and Crestor which could contribute. Stable. - Negative acute hepatitis panel from 3/5 - We will continue amiodarone and Crestor as liver enzymes seems to be stable. Benefit outweighs the risk. - We will check liver enzymes intermittently. - Stable at present  Protein calorie malnutrition: BMI 36. Due to chronic illness. -Appreciate dietitian input-continue supplements.  DVT prophylaxis: eliquis Code Status: full  Family Communication: none at bedside Disposition Plan: pending   Consultants:   CT surgery  IR  Cardiology  Procedures:  Chest tube 4/16   Antimicrobials:  Anti-infectives (From admission, onward)   Start     Dose/Rate Route Frequency Ordered Stop   11/15/18 1200  ampicillin (OMNIPEN) 2 g in sodium chloride 0.9 % 100 mL IVPB     2 g 300 mL/hr  over 20 Minutes Intravenous Every 6 hours 11/15/18 1120     11/13/18 0600  vancomycin  (VANCOCIN) 1,750 mg in sodium chloride 0.9 % 500 mL IVPB  Status:  Discontinued     1,750 mg 250 mL/hr over 120 Minutes Intravenous Every 12 hours 11/12/18 1553 11/15/18 1120   11/12/18 1630  vancomycin (VANCOCIN) 2,000 mg in sodium chloride 0.9 % 500 mL IVPB  Status:  Discontinued     2,000 mg 250 mL/hr over 120 Minutes Intravenous  Once 11/12/18 1532 11/12/18 1537   11/12/18 1630  vancomycin (VANCOCIN) 2,500 mg in sodium chloride 0.9 % 500 mL IVPB     2,500 mg 250 mL/hr over 120 Minutes Intravenous  Once 11/12/18 1537 11/12/18 2035   11/12/18 1600  piperacillin-tazobactam (ZOSYN) IVPB 3.375 g  Status:  Discontinued     3.375 g 12.5 mL/hr over 240 Minutes Intravenous Every 8 hours 11/12/18 1527 11/15/18 1120         Subjective: Feels ok.  No specific complaints today.  Objective: Vitals:   11/19/18 1100 11/19/18 1239 11/19/18 1244 11/19/18 1300  BP:   (!) 94/55 (!) 108/57  Pulse:   (!) 59   Resp:   11   Temp:   98 F (36.7 C)   TempSrc:   Oral   SpO2: 97%  100% 98%  Weight:      Height:  6\' 4"  (1.93 m)      Intake/Output Summary (Last 24 hours) at 11/19/2018 1413 Last data filed at 11/19/2018 1247 Gross per 24 hour  Intake 1600 ml  Output 2020 ml  Net -420 ml   Filed Weights   11/14/18 1802  Weight: 131.8 kg    Examination:  General exam: Appears calm and comfortable  Respiratory system: Clear to auscultation. Respiratory effort normal.  R sided pig tail.  Cardiovascular system: S1 & S2 heard, RRR. Gastrointestinal system: Abdomen is nondistended, soft and nontender Central nervous system: Alert and oriented. No focal neurological deficits. Extremities: bilateral LE in unna boots. Skin: No rashes, lesions or ulcers Psychiatry: Judgement and insight appear normal. Mood & affect appropriate.     Data Reviewed: I have personally reviewed following labs and imaging studies  CBC: Recent Labs  Lab 11/13/18 0320 11/14/18 0600 11/15/18 0527 11/16/18 0600  11/17/18 0500 11/18/18 0500 11/19/18 0346 11/19/18 0954  WBC 8.7 9.3 7.5 9.4  --   --   --  10.7*  HGB 7.1* 7.7* 7.5* 7.4* 7.7* 7.5* 7.4* 7.5*  HCT 23.4* 24.8* 25.2* 24.2* 26.4* 24.8* 24.1* 24.8*  MCV 91.1 90.5 91.3 91.7  --   --   --  95.0  PLT 330 360 347 352  --   --   --  276   Basic Metabolic Panel: Recent Labs  Lab 11/13/18 0320 11/14/18 0600 11/15/18 0527 11/19/18 0954  NA 138 139  --  138  K 3.7 4.1  --  3.8  CL 99 98  --  104  CO2 31 32  --  26  GLUCOSE 123* 102*  --  122*  BUN 21* 18  --  18  CREATININE 0.86 0.82  --  0.98  CALCIUM 7.8* 8.1*  --  8.2*  MG  --  2.2 2.2  --    GFR: Estimated Creatinine Clearance: 127.7 mL/min (by C-G formula based on SCr of 0.98 mg/dL). Liver Function Tests: Recent Labs  Lab 11/13/18 0320 11/14/18 0600  AST 79* 53*  ALT 98* 88*  ALKPHOS 123 118  BILITOT 0.7 1.0  PROT 5.3* 5.8*  ALBUMIN 1.4* 1.4*   No results for input(s): LIPASE, AMYLASE in the last 168 hours. No results for input(s): AMMONIA in the last 168 hours. Coagulation Profile: Recent Labs  Lab 11/12/18 1826  INR 1.2   Cardiac Enzymes: No results for input(s): CKTOTAL, CKMB, CKMBINDEX, TROPONINI in the last 168 hours. BNP (last 3 results) No results for input(s): PROBNP in the last 8760 hours. HbA1C: No results for input(s): HGBA1C in the last 72 hours. CBG: No results for input(s): GLUCAP in the last 168 hours. Lipid Profile: No results for input(s): CHOL, HDL, LDLCALC, TRIG, CHOLHDL, LDLDIRECT in the last 72 hours. Thyroid Function Tests: No results for input(s): TSH, T4TOTAL, FREET4, T3FREE, THYROIDAB in the last 72 hours. Anemia Panel: No results for input(s): VITAMINB12, FOLATE, FERRITIN, TIBC, IRON, RETICCTPCT in the last 72 hours. Sepsis Labs: No results for input(s): PROCALCITON, LATICACIDVEN in the last 168 hours.  Recent Results (from the past 240 hour(s))  Culture, blood (Routine X 2) w Reflex to ID Panel     Status: None   Collection  Time: 11/12/18  2:59 PM  Result Value Ref Range Status   Specimen Description BLOOD RIGHT ARM  Final   Special Requests   Final    BOTTLES DRAWN AEROBIC ONLY Blood Culture adequate volume   Culture   Final    NO GROWTH 5 DAYS Performed at [facility], [address], [place], [place] [address]    Report Status 11/17/2018 FINAL  Final  Culture, blood (Routine X 2) w Reflex to ID Panel     Status: None   Collection Time: 11/12/18  3:05 PM  Result Value Ref Range Status   Specimen Description BLOOD LEFT HAND  Final   Special Requests   Final    BOTTLES DRAWN AEROBIC ONLY Blood Culture adequate volume   Culture   Final    NO GROWTH 5 DAYS Performed at [facility], [address] [place], [place] [address]    Report Status 11/17/2018 FINAL  Final  Aerobic/Anaerobic Culture (surgical/deep wound)     Status: None   Collection Time: 11/12/18  6:02 PM  Result Value Ref Range Status   Specimen Description PLEURAL  Final   Special Requests RIGHT  Final   Gram Stain   Final    MODERATE WBC PRESENT, PREDOMINANTLY PMN RARE GRAM NEGATIVE RODS    Culture   Final    MODERATE ESCHERICHIA COLI NO ANAEROBES ISOLATED Performed at [facility], [address], [place], [place] [address]    Report Status 11/15/2018 FINAL  Final   Organism ID, Bacteria ESCHERICHIA COLI  Final      Susceptibility   Escherichia coli - MIC*    AMPICILLIN 8 SENSITIVE Sensitive     CEFAZOLIN <=4 SENSITIVE Sensitive     CEFEPIME <=1 SENSITIVE Sensitive     CEFTAZIDIME <=1 SENSITIVE Sensitive     CEFTRIAXONE <=1 SENSITIVE Sensitive     CIPROFLOXACIN <=0.25 SENSITIVE Sensitive     GENTAMICIN <=1 SENSITIVE Sensitive     IMIPENEM <=0.25 SENSITIVE Sensitive     TRIMETH/SULFA <=20 SENSITIVE Sensitive     AMPICILLIN/SULBACTAM <=2 SENSITIVE Sensitive     PIP/TAZO <=4 SENSITIVE Sensitive     Extended ESBL NEGATIVE Sensitive     * MODERATE ESCHERICHIA COLI  Expectorated sputum assessment w rflx to  resp cult     Status: None   Collection Time: 11/12/18  8:24  PM  Result Value Ref Range Status   Specimen Description EXPECTORATED SPUTUM  Final   Special Requests NONE  Final   Sputum evaluation   Final    Sputum specimen not acceptable for testing.  Please recollect.   RESULT CALLED TO, READ BACK BY AND VERIFIED WITH: DAVIS AT 0736 ON [id number] BY SJW Performed at [facility], [address] [place], [place] [address]    Report Status 11/13/2018 FINAL  Final         Radiology Studies: Dg Chest 2 View  Result Date: 11/18/2018 CLINICAL DATA:  RIGHT chest tube in place for empyema. EXAM: CHEST - 2 VIEW COMPARISON:  11/17/2018 and earlier. FINDINGS: AP SEMI-ERECT and LATERAL images were obtained. Cardiac silhouette moderately enlarged, unchanged. Pulmonary vascularity normal without evidence of pulmonary edema. Pigtail catheter in the RIGHT pleural space with residual small to moderate-sized RIGHT pleural effusion/empyema, unchanged over the past 4-5 days. Consolidation in the RIGHT LOWER LOBE, RIGHT MIDDLE LOBE and LEFT LOWER LOBE, also unchanged. No new pulmonary parenchymal abnormalities. IMPRESSION: 1. Stable small to moderate-sized RIGHT pleural effusion/empyema. 2. Stable atelectasis and/or pneumonia involving the RIGHT LOWER LOBE, RIGHT MIDDLE LOBE and LEFT LOWER LOBE. 3. Stable cardiomegaly without pulmonary edema. 4. No new abnormalities. Electronically Signed   By: Hulan Saas M.D.   On: 11/18/2018 08:19   Dg Chest Port 1 View  Result Date: 11/19/2018 CLINICAL DATA:  Pneumonia, empyema and status post right pigtail chest tube placement. EXAM: PORTABLE CHEST 1 VIEW COMPARISON:  11/18/2018 FINDINGS: Stable cardiac enlargement. Stable positioning of right-sided pigtail chest tube. Stable residual small amount right-sided pleural fluid. Stable dense atelectasis and consolidation of the right lower lung. No pneumothorax. Stable positioning of left-sided PICC line with the  catheter tip in the distal SVC. IMPRESSION: Stable chest x-ray demonstrating small amount of residual right pleural fluid and dense consolidation of the right lower lung. Electronically Signed   By: Irish Lack M.D.   On: 11/19/2018 07:47   Dg Chest Port 1 View  Result Date: 11/18/2018 CLINICAL DATA:  Air leak EXAM: PORTABLE CHEST 1 VIEW COMPARISON:  11/18/2018, 11/17/2018, 11/12/2018 FINDINGS: Left upper extremity catheter tip overlies the cavoatrial region. Similar positioning of right-sided chest tube. Similar residual right pleural thickening. Increased peripheral lucency, which may reflect air in the pleural space. Consolidation at the right base. Enlarged cardiomediastinal silhouette. IMPRESSION: 1. Similar appearance of right pleural effusion or thickening and right basilar infiltrates. 2. There may be slight increased peripheral lucency in the right thorax which may reflect increased air in the pleural space. Electronically Signed   By: Jasmine Pang M.D.   On: 11/18/2018 18:34        Scheduled Meds:  amiodarone  200 mg Oral Daily   apixaban  5 mg Oral BID   carvedilol  3.125 mg Oral BID WC   digoxin  0.125 mg Oral Daily   feeding supplement (ENSURE ENLIVE)  237 mL Oral TID BM   furosemide  40 mg Oral Daily   pantoprazole  40 mg Oral BID AC   polyethylene glycol  17 g Oral BID   rosuvastatin  20 mg Oral q1800   Continuous Infusions:  ampicillin (OMNIPEN) IV 2 g (11/19/18 1304)     LOS: 7 days    Time spent: over 30 min    Lacretia Nicks, MD [facility] Pager AMION  If 7PM-7AM, please contact night-coverage [email or web address] Password [facility] 11/19/2018, 2:13 PM

## 2018-11-19 NOTE — Consult Note (Signed)
WOC Nurse wound consult note Reason for Consult:Wears weekly Unna boots. Needs replacing.  On Eliquis.  (+) for DVT Wound type:none Pressure Injury POA:NA  Dressing procedure/placement/frequency:Weekly Unna boots by Ortho tech.  change on Thursday. Will not follow at this time.  Please re-consult if needed.  Maple Hudson MSN, RN, FNP-BC CWON Wound, Ostomy, Continence Nurse Pager [phone]

## 2018-11-19 NOTE — Progress Notes (Signed)
Nutrition Follow-up  DOCUMENTATION CODES:   Obesity unspecified  INTERVENTION:    Continue Ensure Enlive po TID, each supplement provides 350 kcal and 20 grams of protein  Obtain recent weight  NUTRITION DIAGNOSIS:   Increased nutrient needs related to acute illness as evidenced by estimated needs.  Ongoing  GOAL:   Patient will meet greater than or equal to 90% of their needs  Met PO  MONITOR:   PO intake, Supplement acceptance, Diet advancement, Weight trends, I & O's  REASON FOR ASSESSMENT:   Consult Assessment of nutrition requirement/status  ASSESSMENT:   52 y/o male who was recently hospitalized 3/5-4/9 with new onset HF, 3-vessel CAD. Course c/b new onset Afib, Spontaneous R thigh hematoma, and ileus. Discharged to CIR 4/9. Now represents to hospital d/t progressive weakness/SOB x2-3 D. CXR concerning for empyema. Admitted for management.    4/16- right chest tube placement 4/22- rapid response due to chest tube, chest xray clear  RD working remotely.  Unable to reach pt by phone x 2. Meal completions charted as 100% for pt's last eight meals. Flowsheets show pt is compliant with Ensure TID. Will continue with current interventions.   A weight has not been obtained since start of admission. Recommend checking daily weights to monitor trends.    Plan to transfer to CIR once chest tube removed.   I/O: -7,473 ml since admit UOP: 2,050 ml x 24 hrs  Chest tube: 120 ml x 24 hrs   Medications: 40 mg lasix, miralax Labs: reviewed   Diet Order:   Diet Order            Diet Heart Room service appropriate? Yes with Assist; Fluid consistency: Thin  Diet effective now              EDUCATION NEEDS:   No education needs have been identified at this time  Skin:  Skin Integrity Issues:: Stage II Stage II: PU stage II to R buttocks. PU stage II to R, medial nare (from ngt)  Last BM:  4/22  Height:   Ht Readings from Last 1 Encounters:  10/18/18 6' 4"   (1.93 m)    Weight:   Wt Readings from Last 1 Encounters:  11/14/18 131.8 kg    Ideal Body Weight:  91.82 kg  BMI:  Body mass index is 35.37 kg/m.  Estimated Nutritional Needs:   Kcal:  2100-2350 kcals (16-18 kcal/kg bw)  Protein:  110-130g (1.2-1.4g/kg)  Fluid:  >/= 2 L/day    Mariana Single RD, LDN Clinical Nutrition Pager # - [phone]

## 2018-11-19 NOTE — Progress Notes (Signed)
Orthopedic Tech Progress Note Patient Details:  Aaron [name] [birth year] [id number]  Ortho Devices Type of Ortho Device: Radio broadcast assistant Ortho Device/Splint Interventions: Ordered, Application, Adjustment   Post Interventions Patient Tolerated: Well Instructions Provided: Adjustment of device, Care of device   [name] [name] 11/19/2018, 4:18 PM

## 2018-11-20 ENCOUNTER — Inpatient Hospital Stay (HOSPITAL_COMMUNITY): Payer: Medicaid Other

## 2018-11-20 DIAGNOSIS — J90 Pleural effusion, not elsewhere classified: Secondary | ICD-10-CM

## 2018-11-20 LAB — COMPREHENSIVE METABOLIC PANEL
ALT: 30 U/L (ref 0–44)
AST: 22 U/L (ref 15–41)
Albumin: 1.4 g/dL — ABNORMAL LOW (ref 3.5–5.0)
Alkaline Phosphatase: 65 U/L (ref 38–126)
Anion gap: 10 (ref 5–15)
BUN: 14 mg/dL (ref 6–20)
CO2: 23 mmol/L (ref 22–32)
Calcium: 5.7 mg/dL — CL (ref 8.9–10.3)
Chloride: 113 mmol/L — ABNORMAL HIGH (ref 98–111)
Creatinine, Ser: 0.75 mg/dL (ref 0.61–1.24)
GFR calc Af Amer: >60 mL/min (ref >60)
GFR calc non Af Amer: >60 mL/min (ref >60)
Glucose, Bld: 75 mg/dL (ref 70–99)
Potassium: 3.6 mmol/L (ref 3.5–5.1)
Sodium: 146 mmol/L — ABNORMAL HIGH (ref 135–145)
Total Bilirubin: 0.8 mg/dL (ref 0.3–1.2)
Total Protein: 4.3 g/dL — ABNORMAL LOW (ref 6.5–8.1)

## 2018-11-20 LAB — CBC
HCT: 21.2 % — ABNORMAL LOW (ref 39.0–52.0)
Hemoglobin: 6.4 g/dL — CL (ref 13.0–17.0)
MCH: 28.6 pg (ref 26.0–34.0)
MCHC: 30.2 g/dL (ref 30.0–36.0)
MCV: 94.6 fL (ref 80.0–100.0)
Platelets: 212 10*3/uL (ref 150–400)
RBC: 2.24 MIL/uL — ABNORMAL LOW (ref 4.22–5.81)
RDW: 19.8 % — ABNORMAL HIGH (ref 11.5–15.5)
WBC: 8.6 10*3/uL (ref 4.0–10.5)
nRBC: 0 % (ref 0.0–0.2)

## 2018-11-20 LAB — PREPARE RBC (CROSSMATCH)

## 2018-11-20 LAB — MAGNESIUM: Magnesium: 1.5 mg/dL — ABNORMAL LOW (ref 1.7–2.4)

## 2018-11-20 LAB — HEMOGLOBIN AND HEMATOCRIT, BLOOD
HCT: 25.4 % — ABNORMAL LOW (ref 39.0–52.0)
Hemoglobin: 7.7 g/dL — ABNORMAL LOW (ref 13.0–17.0)

## 2018-11-20 LAB — ALBUMIN: Albumin: 1.8 g/dL — ABNORMAL LOW (ref 3.5–5.0)

## 2018-11-20 MED ORDER — SODIUM CHLORIDE 0.9% IV SOLUTION: Freq: Once | INTRAVENOUS | Status: DC

## 2018-11-20 MED ORDER — MAGNESIUM SULFATE 2 GM/50ML IV SOLN
2.0000 g | Freq: Once | INTRAVENOUS | Status: DC
  Filled 2018-11-20 (×4): qty 50

## 2018-11-20 MED ORDER — ONDANSETRON HCL 4 MG/2ML IJ SOLN
4.0000 mg | Freq: Four times a day (QID) | INTRAMUSCULAR | Status: DC | PRN
  Filled 2018-11-20: qty 2

## 2018-11-20 NOTE — Progress Notes (Signed)
Discontinue order to transfuse. Repeat Hemoglobin 7.7. Called blood bank to make aware that 2 held units were no longer needed.

## 2018-11-20 NOTE — Progress Notes (Signed)
Physical Therapy Treatment Patient Details Name: Aaron [name] MRN: [id number] DOB: Jun 30, 1966 Today's Date: 11/20/2018    History of Present Illness 53 year old male with no significant past medical history until her recent prolonged hospitalization from 09/30/20/9/20 for new onset ischemic CHF (ICM), three-vessel CAD (not a candidate for CABG per CTS), new onset A. Fib, spontaneous right thigh hematoma and GI bleed.  His hospital course was complicated by a dynamic ileus/SBO requiring NG tube.  He was discharged to CIR, returned to acute with progressive weakness and dyspnea over the last 2 to 3 days. CT chest was obtained and concerning for empyema. Chest tube placement 4/16    PT Comments    Pt admitted with above diagnosis. Pt currently with functional limitations due to balance and endurance deficits. Pt was able to tolerate 11 min of standing at 44 degrees most of time and then 20 seconds at 82 degrees full tilt.  Pt nauseated but worked through it.  Activation at quads bilaterally improving.   Pt will benefit from skilled PT to increase their independence and safety with mobility to allow discharge to the venue listed below.     Follow Up Recommendations  CIR;Supervision/Assistance - 24 hour     Equipment Recommendations  Other (comment)(TBD)    Recommendations for Other Services       Precautions / Restrictions Precautions: chest tube Precautions: Fall Precaution Comments: monitor BP Required Braces or Orthoses: Other Brace Other Brace: RLE Bledsoe brace locked in extension when ambulating that can be d/c'd when pt can perform 10 SLRs without assist Restrictions Weight Bearing Restrictions: No RLE Weight Bearing: Weight bearing as tolerated LLE Weight Bearing: Weight bearing as tolerated    Mobility  Bed Mobility Overal bed mobility: Needs Assistance Bed Mobility: Rolling Rolling: Min assist         General bed mobility comments: Pt on bedpan and rolled to be cleaned  prior to treatment  Transfers Overall transfer level: Needs assistance Equipment used: (Vital Go tilt bed) Transfers: Sit to/from Stand Sit to Stand: Total assist;+2 physical assistance         General transfer comment: Tolerated tilt at 44 degrees for 10  minutes. Last minute inclined bed to 82 degrees and had pt hold onto RW and push up to extend hips and knees and pt tolerated up to 20 seconds or so.  Reclined bed to 28 degrees and pt did 10 squats.   Actively shifting weight and performing quad sets and glut sets while standing as well as working UEs as his BP was soft.  Initial BP 106/69 in supine; 84/61 at 44degrees tilt and 94/54 last time taken.  Pt did c/o nausea off and on during session but distractions seemed to make pt push through.   Ambulation/Gait                 Stairs             Wheelchair Mobility    Modified Rankin (Stroke Patients Only)       Balance Overall balance assessment: Needs assistance Sitting-balance support: Bilateral upper extremity supported Sitting balance-Leahy Scale: Fair Sitting balance - Comments: pt leaning toward L with bed in chair position, moving away from chest tube pain                                    Cognition Arousal/Alertness: Awake/alert Behavior During Therapy: [facility] for tasks assessed/performed Overall  Cognitive Status: Within Functional Limits for tasks assessed                                 General Comments: Pt nauseaous and not feeling well      Exercises Total Joint Exercises Quad Sets: AROM;Both;15 reps;Standing General Exercises - Upper Extremity Shoulder Flexion: Both;Theraband;15 reps;AROM(standing) Low Level/ICU Exercises Stabilized Bridging: AROM;Both;5 reps;Supine Other Exercises Other Exercises: mini squats in Vital go bed, focus on quad activation  Other Exercises: Bilateral hip/trunk extension while tilted    General Comments        Pertinent  Vitals/Pain Pain Assessment: Faces Faces Pain Scale: Hurts whole lot Pain Location: site of chest tube Pain Descriptors / Indicators: Burning Pain Intervention(s): Limited activity within patient's tolerance;Monitored during session;Repositioned    Home Living                      Prior Function            PT Goals (current goals can now be found in the care plan section) Acute Rehab PT Goals Patient Stated Goal: to get stronger Progress towards PT goals: Progressing toward goals    Frequency    Min 3X/week      PT Plan Current plan remains appropriate    Co-evaluation              AM-PAC PT "6 Clicks" Mobility   Outcome Measure  Help needed turning from your back to your side while in a flat bed without using bedrails?: A Little Help needed moving from lying on your back to sitting on the side of a flat bed without using bedrails?: A Lot Help needed moving to and from a bed to a chair (including a wheelchair)?: Total Help needed standing up from a chair using your arms (e.g., wheelchair or bedside chair)?: Total Help needed to walk in hospital room?: Total Help needed climbing 3-5 steps with a railing? : Total 6 Click Score: 9    End of Session Equipment Utilized During Treatment: Other (comment)(Vital go Tilt bed) Activity Tolerance: Patient limited by fatigue(limited by nausea) Patient left: in bed;with call bell/phone within reach Nurse Communication: Mobility status PT Visit Diagnosis: Muscle weakness (generalized) (M62.81);Difficulty in walking, not elsewhere classified (R26.2);Pain Pain - part of body: (chest tube site)     Time: 3893-7342 PT Time Calculation (min) (ACUTE ONLY): 34 min  Charges:  $Therapeutic Exercise: 8-22 mins $Therapeutic Activity: 8-22 mins                     Ariza Evans,PT Acute Rehabilitation Services Pager:  [phone]  Office:  [phone]     Berline Lopes 11/20/2018, 3:06 PM

## 2018-11-20 NOTE — Progress Notes (Signed)
Notified by CCMD that patient's heart rate dropped as low as 34.(See highlighted rates). Patient is asymptomatic and resting. Patient asked to be placed on oxygen, sats mid 90's. Will continue to monitor

## 2018-11-20 NOTE — Progress Notes (Addendum)
      [address]       [name] [address]             [phone]           Subjective: Patient without specific complaints this am.  Objective: Vital signs in last 24 hours: Temp:  [97.6 F (36.4 C)-98.3 F (36.8 C)] 97.6 F (36.4 C) (04/24 0538) Pulse Rate:  [34-75] 67 (04/24 0538) Cardiac Rhythm: Atrial fibrillation (04/23 2000) Resp:  [11-25] 21 (04/24 0538) BP: (94-132)/(55-100) 98/67 (04/24 0538) SpO2:  [88 %-100 %] 99 % (04/24 0538)     Intake/Output from previous day: 04/23 0701 - 04/24 0700 In: 760 [P.O.:360; IV Piggyback:400] Out: 2420 [Urine:2350; Chest Tube:70]   Physical Exam:  Cardiovascular: IRRR IRRR Pulmonary: Breath sounds slightly diminished on the right Wounds: Dressing is clean and dry.   Chest Tube: to water seal, intermittent and small air leak (purulent drainage)  Lab Results: CBC: Recent Labs    11/19/18 0954 11/20/18 0507  WBC 10.7* 8.6  HGB 7.5* 6.4*  HCT 24.8* 21.2*  PLT 276 212   BMET:  Recent Labs    11/19/18 0954 11/20/18 0507  NA 138 146*  K 3.8 3.6  CL 104 113*  CO2 26 23  GLUCOSE 122* 75  BUN 18 14  CREATININE 0.98 0.75  CALCIUM 8.2* 5.7*    PT/INR:  No results for input(s): LABPROT, INR in the last 72 hours. ABG:  INR: Will add last result for INR, ABG once components are confirmed Will add last 4 CBG results once components are confirmed  Assessment/Plan:  1. CV-chronic a fib. HR did brady into the 30's briefly last night. On Carvedilol 3.125 mg bid, Amiodarone 200 mg daily, Digoxin 0.125 mg daily, and Apixaban 5 mg bid. 2. Pulmonary - S/p 14 French pigtail tube 04/16 and thrombolytics yesterday. On 1-2 liter of oxygen via [place] at night only.  Chest tube is to suction and no air leak. Chest tube with 70 cc last 24 hours. CXR appears stable. Right pleural fluid culture showed E. Coli.    3. ID-on Ampicillin for E. Coli 4. H and H this am decreased to 6.4 and 21.2. Transfusion per primary  5. Hypocalcemia-per primary  Donielle M ZimmermanPA-C 11/20/2018,7:16 AM [phone]  Tiny air leak this afternoon, decreased from yesterday. Hopefully can get tube out over the weekend  [facility]

## 2018-11-20 NOTE — Progress Notes (Signed)
PT Cancellation Note  Patient Details Name: Aaron [name] MRN: [id number] DOB: 1966-02-16   Cancelled Treatment:    Reason Eval/Treat Not Completed: Medical issues which prohibited therapy(Pt going to get blood due to low HGb.   HOLD PT this am.)   Berline Lopes 11/20/2018, 9:15 AM  Joban Colledge,PT Acute Rehabilitation Services Pager:  [phone]  Office:  [phone]

## 2018-11-20 NOTE — Progress Notes (Signed)
[name], male   DOB: 1965/10/17, 53 y.o.   MRN: [id number]   IR Rounding note via phone  Due to new restrictions  Rt chest empyema Drain placed 4/16 in IR  Placed to water seal4/20am per Dr Alexis Goodell No air leak per RN at this point Site is clean and dry 130 cc OP overnight   CXR today:  FINDINGS: Cardiac shadow is enlarged but stable. Left PICC line is again seen and stable. Pigtail catheter is again noted in the right pleural space. Pleural thickening is again noted laterally with mild right basilar atelectasis. No bony abnormality is noted. IMPRESSION: Stable appearance of the chest when compared with the prior exam   Plan per Dr Dorris Fetch

## 2018-11-20 NOTE — Progress Notes (Signed)
CRITICAL VALUE ALERT  Critical Value:  HGB 6.4  Date & Time Notied: 4/24 @ 0558  Provider Notified: [place]  Orders Received/Actions taken: 2 units ordered

## 2018-11-20 NOTE — TOC Initial Note (Signed)
Transition of Care (TOC) - Initial/Assessment Note  Donn Pierini RN, BSN Transitions of Care Unit 4E- RN Case Manager [phone]   Patient Details  Name: Aaron [name] MRN: [id number] Date of Birth: Jan 24, 1966  Transition of Care [facility]) CM/SW Contact:    Darrold Span, RN Phone Number: 11/20/2018, 2:46 PM  Clinical Narrative:                 Pt admitted back to INPT from [facility] IP rehab for acute resp. Failure, HF, afib and empyema. Chest Tube placed per IR on 4/16. CIR consulted for return to IP rehab when medically stable- Caitlyn with IP rehab following for medically stability.   Expected Discharge Plan: IP Rehab Facility Barriers to Discharge: Continued Medical Work up   Patient Goals and CMS Choice Patient states their goals for this hospitalization and ongoing recovery are:: "to go back to rehab" CMS Medicare.gov Compare Post Acute Care list provided to:: Patient Choice offered to / list presented to : Patient  Expected Discharge Plan and Services Expected Discharge Plan: IP Rehab Facility In-house Referral: Clinical Social Work Discharge Planning Services: CM Consult Post Acute Care Choice: IP Rehab Living arrangements for the past 2 months: Post-Acute Facility(Pt was at [facility] IP rehab)                                      Prior Living Arrangements/Services Living arrangements for the past 2 months: Post-Acute Facility(Pt was at [facility]) Lives with:: Adult Children, Spouse, Self Patient language and need for interpreter reviewed:: Yes Do you feel safe going back to the place where you live?: Yes      Need for Family Participation in Patient Care: Yes (Comment) Care giver support system in place?: Yes (comment)   Criminal Activity/Legal Involvement Pertinent to Current Situation/Hospitalization: No - Comment as needed  Activities of Daily Living Home Assistive Devices/Equipment: None ADL Screening (condition at time of admission) Patient's  cognitive ability adequate to safely complete daily activities?: Yes Is the patient deaf or have difficulty hearing?: No Does the patient have difficulty seeing, even when wearing glasses/contacts?: No Does the patient have difficulty concentrating, remembering, or making decisions?: No Patient able to express need for assistance with ADLs?: Yes Does the patient have difficulty dressing or bathing?: Yes Independently performs ADLs?: No Communication: Independent Dressing (OT): Needs assistance Is this a change from baseline?: Change from baseline, expected to last >3 days Grooming: Needs assistance Is this a change from baseline?: Change from baseline, expected to last >3 days Feeding: Independent Is this a change from baseline?: Change from baseline, expected to last <3 days Bathing: Needs assistance Is this a change from baseline?: Change from baseline, expected to last <3 days Toileting: Needs assistance Is this a change from baseline?: Change from baseline, expected to last <3 days In/Out Bed: Needs assistance Is this a change from baseline?: Change from baseline, expected to last <3 days Walks in Home: Needs assistance Is this a change from baseline?: Change from baseline, expected to last <3 days Does the patient have difficulty walking or climbing stairs?: Yes Weakness of Legs: Both Weakness of Arms/Hands: None  Permission Sought/Granted Permission sought to share information with : Family Supports, Oceanographer granted to share information with : Yes, Verbal Permission Granted  Share Information with NAME: Thornell Mule  Permission granted to share info w AGENCY: IP rehab  Permission granted  to share info w Relationship: spouse  Permission granted to share info w Contact Information: [phone]  Emotional Assessment Appearance:: Appears stated age Attitude/Demeanor/Rapport: Engaged Affect (typically observed): Pleasant,  Appropriate Orientation: : Oriented to Situation, Oriented to Place, Oriented to  Time, Oriented to Self Alcohol / Substance Use: Not Applicable Psych Involvement: No (comment)  Admission diagnosis:  pneumothorax Patient Active Problem List   Diagnosis Date Noted  . Empyema (HCC) 11/12/2018  . Debility 11/05/2018  . Pressure injury of skin 11/04/2018  . Iron deficiency anemia   . Orthostatic hypotension   . Hematoma of right thigh 10/30/2018  . Acute blood loss anemia 10/30/2018  . Tobacco abuse 10/30/2018  . Gastritis with bleeding 10/30/2018  . Esophagitis, [place] grade C 10/30/2018  . Protein-calorie malnutrition, severe 10/26/2018  . SOB (shortness of breath)   . Hypokalemia   . Atrial fibrillation with rapid ventricular response (HCC) 10/12/2018  . Morbid obesity (HCC) 10/09/2018  . Acute systolic CHF (congestive heart failure) (HCC) 10/02/2018  . Acute hypoxemic respiratory failure (HCC) 10/02/2018  . Anasarca 10/01/2018  . AKI (acute kidney injury) (HCC) 10/01/2018   PCP:  [facility]:   [facility] [phone] - [place], [place] - [address] AT [address] OF [facility] [address] [place] [address] Phone: [phone] Fax: [phone]     Social Determinants of Health (SDOH) Interventions    Readmission Risk Interventions No flowsheet data found.

## 2018-11-20 NOTE — Plan of Care (Signed)
  Problem: Clinical Measurements: Goal: Respiratory complications will improve Outcome: Progressing Goal: Cardiovascular complication will be avoided Outcome: Progressing   Problem: Elimination: Goal: Will not experience complications related to bowel motility Outcome: Progressing   Problem: Health Behavior/Discharge Planning: Goal: Ability to manage health-related needs will improve Outcome: Not Progressing

## 2018-11-20 NOTE — Progress Notes (Signed)
Occupational Therapy Treatment Patient Details Name: Aaron [name] MRN: [id number] DOB: 1966/05/27 Today's Date: 11/20/2018    History of present illness 53 year old male with no significant past medical history until her recent prolonged hospitalization from 09/30/20/9/20 for new onset ischemic CHF (ICM), three-vessel CAD (not a candidate for CABG per CTS), new onset A. Fib, spontaneous right thigh hematoma and GI bleed.  His hospital course was complicated by a dynamic ileus/SBO requiring NG tube.  He was discharged to CIR, returned to acute with progressive weakness and dyspnea over the last 2 to 3 days. CT chest was obtained and concerning for empyema. Chest tube placement 4/16   OT comments  Session limited to grooming at bed level and UB exercises using level 2 theraband. Pt with hgb of 6.4. Pt requesting CPM, placed R LE at end of session. Will continue to follow.  Follow Up Recommendations  CIR;Supervision/Assistance - 24 hour    Equipment Recommendations       Recommendations for Other Services      Precautions / Restrictions Precautions Precautions: Fall Precaution Comments: monitor BP Required Braces or Orthoses: Other Brace Other Brace: RLE Bledsoe brace locked in extension when up- okay to unlock at EOB  Restrictions Weight Bearing Restrictions: No RLE Weight Bearing: Weight bearing as tolerated LLE Weight Bearing: Weight bearing as tolerated       Mobility Bed Mobility                  Transfers                      Balance                                           ADL either performed or assessed with clinical judgement   ADL Overall ADL's : Needs assistance/impaired     Grooming: Wash/dry hands;Wash/dry face;Bed level;Set up                                       Vision       Perception     Praxis      Cognition Arousal/Alertness: Awake/alert Behavior During Therapy: [facility] for tasks  assessed/performed Overall Cognitive Status: Within Functional Limits for tasks assessed                                          Exercises Exercises: General Upper Extremity General Exercises - Upper Extremity Shoulder Flexion: Strengthening;Both;Supine;Theraband;15 reps Theraband Level (Shoulder Flexion): Level 2 (Red) Shoulder Extension: Strengthening;Supine;Theraband;15 reps;Both Theraband Level (Shoulder Extension): Level 2 (Red) Shoulder Horizontal ABduction: Supine;Theraband;15 reps;Both;Strengthening Theraband Level (Shoulder Horizontal Abduction): Level 2 (Red) Elbow Flexion: Strengthening;Both;Theraband;Supine;15 reps Theraband Level (Elbow Flexion): Level 2 (Red) Elbow Extension: Strengthening;Both;Theraband;Supine;15 reps Theraband Level (Elbow Extension): Level 2 (Red)   Shoulder Instructions       General Comments      Pertinent Vitals/ Pain       Pain Assessment: No/denies pain  Home Living  Prior Functioning/Environment              Frequency  Min 3X/week        Progress Toward Goals  OT Goals(current goals can now be found in the care plan section)  Progress towards OT goals: Progressing toward goals  Acute Rehab OT Goals Patient Stated Goal: to get stronger OT Goal Formulation: With patient Time For Goal Achievement: 12/01/18 Potential to Achieve Goals: Good  Plan Discharge plan remains appropriate    Co-evaluation                 AM-PAC OT "6 Clicks" Daily Activity     Outcome Measure   Help from another person eating meals?: None Help from another person taking care of personal grooming?: A Little Help from another person toileting, which includes using toliet, bedpan, or urinal?: Total Help from another person bathing (including washing, rinsing, drying)?: A Lot Help from another person to put on and taking off regular upper body clothing?: A  Lot Help from another person to put on and taking off regular lower body clothing?: Total 6 Click Score: 13    End of Session    OT Visit Diagnosis: Unsteadiness on feet (R26.81);Other abnormalities of gait and mobility (R26.89);Muscle weakness (generalized) (M62.81);Pain   Activity Tolerance Treatment limited secondary to medical complications (Comment)(hgb of 6.4)   Patient Left in bed;with call bell/phone within reach;in CPM   Nurse Communication Other (comment)(ok to give ensure)        Time: 0826-0859 OT Time Calculation (min): 33 min  Charges: OT General Charges $OT Visit: 1 Visit OT Treatments $Therapeutic Exercise: 23-37 mins  Martie Round, OTR/L Acute Rehabilitation Services Pager: [phone] Office: [phone]   Evern Bio 11/20/2018, 9:09 AM

## 2018-11-20 NOTE — Progress Notes (Addendum)
PROGRESS NOTE    Aaron [name]  ZOX:[id number] DOB: 1965-08-16 DOA: 11/12/2018 PCP: [facility] [place]   Brief Narrative:  53 year old male with no significant past medical history until her recent prolonged hospitalization from 09/30/20/9/20 for new onset ischemic CHF (ICM), three-vessel CAD (not Katianna Mcclenney candidate for CABG per CTS), new onset Neleh Muldoon. Fib, spontaneous right thigh hematoma and GI bleed. His hospital course was complicated by Laquonda Welby dynamic ileus/SBO requiring NG tube. He was discharged to CIR, and returns with progressive weakness and dyspnea over the last 2 to 3 days.  He had CXR that was concerning for hydro-versus hemothorax for which he was admitted. CT chest was obtained and concerning for empyema. Cardiothoracic surgery consulted by admitting provider and recommended pigtail placement by IR for potential thrombolytics in few days. He was also started on Vanco and Zosyn for possible HAP/parapneumonic effusion.  Patient had chest tube placed later the day of admission.Also has had thrombolytics. Chest tube drained about 4 L of purulent material since then. Culture grew pansensitive E. Coli. Transitioned to ampicillin on 11/15/2018.  Patient stable.  Chest tube remains in place.  CT surgery and IR following.  Transfer to cardiac telemetry.   Assessment & Plan:   Active Problems:   Acute systolic CHF (congestive heart failure) (HCC)   Acute hypoxemic respiratory failure (HCC)   Morbid obesity (HCC)   Atrial fibrillation with rapid ventricular response (HCC)   Hypokalemia   Protein-calorie malnutrition, severe   Hematoma of right thigh   Acute blood loss anemia   Orthostatic hypotension   Empyema (HCC)   Right Sided Empyema  Dyspnea/fatigue  - likely due to pleural effusion/parapneumonic effusion-improving. -Status post chest tube placement by IR on 4/16 and thrombolytics.  -Chest tube drained~110 ccin the last 24-hour.  -No fluid cytology or  chemistry sent.  -Culture grew pansensitive E. Coli. Blood cultures NGTD -Vancomycin and Zosyn for HAP-4/16-4/19 -Continued on IV ampicillin 4/19-- -IR and CTS following -Per CTS, continue chest tube today.  Awaiting recs from Dr. Dorris Fetch.  Chronic systolic CHF/ICM: Echo with EF of 15%. Appears euvolemic.No cardiopulmonary symptoms at this time -Continue home Lasix-good urine output. -ARB on hold due tosoft blood pressures. -Daily weight, intake output and renal function.  History of three-vessel CAD:not Oseas Detty candidate for CABG per CTSduring previous hospitalization-stable. -Continue home medications (low-dose Coreg and Crestor) as tolerated  History of blood loss anemia/GI bleed:  Hb this morning 6.4, on repeat 7.7, continue to monitor and hold off on transfusion -We will transfuse if below 7. -Continue with PPI as tolerated. Stable for now  Paroxysmal Joan Herschberger. fib without RVR -Continue home Eliquis, amiodarone, digoxin and Coreg - Per cards, amiodarone reduced to 200 mg daily.  Please f/u with cardiology when nearing discharge to ensure follow up.  Bradycardia: - Pt currently on carvedilol and digoxin.  HR currently in the 60's.   - Had bradycardia noted overnight - For now, will continue carvedilol.  Hold digoxin.    Elevated liver enzymes: No history of liver disease. CT on 3/23 did not show any hepatobiliary abnormalities. He is on amiodarone and Crestor which could contribute. Stable. - Negative acute hepatitis panel from 3/5 - We will continue amiodarone and Crestor as liver enzymes seems to be stable. Benefit outweighs the risk. - We will check liver enzymes intermittently. - Stable at present  Protein calorie malnutrition: BMI 36. Due to chronic illness. -Appreciate dietitian input-continue supplements.  DVT prophylaxis: eliquis Code Status: full  Family Communication: none at  bedside Disposition Plan: pending   Consultants:   CT  surgery  IR  Cardiology  Procedures:  Chest tube 4/16   Antimicrobials:  Anti-infectives (From admission, onward)   Start     Dose/Rate Route Frequency Ordered Stop   11/15/18 1200  ampicillin (OMNIPEN) 2 g in sodium chloride 0.9 % 100 mL IVPB     2 g 300 mL/hr over 20 Minutes Intravenous Every 6 hours 11/15/18 1120     11/13/18 0600  vancomycin (VANCOCIN) 1,750 mg in sodium chloride 0.9 % 500 mL IVPB  Status:  Discontinued     1,750 mg 250 mL/hr over 120 Minutes Intravenous Every 12 hours 11/12/18 1553 11/15/18 1120   11/12/18 1630  vancomycin (VANCOCIN) 2,000 mg in sodium chloride 0.9 % 500 mL IVPB  Status:  Discontinued     2,000 mg 250 mL/hr over 120 Minutes Intravenous  Once 11/12/18 1532 11/12/18 1537   11/12/18 1630  vancomycin (VANCOCIN) 2,500 mg in sodium chloride 0.9 % 500 mL IVPB     2,500 mg 250 mL/hr over 120 Minutes Intravenous  Once 11/12/18 1537 11/12/18 2035   11/12/18 1600  piperacillin-tazobactam (ZOSYN) IVPB 3.375 g  Status:  Discontinued     3.375 g 12.5 mL/hr over 240 Minutes Intravenous Every 8 hours 11/12/18 1527 11/15/18 1120         Subjective: No complaints.  Objective: Vitals:   11/20/18 0756 11/20/18 0826 11/20/18 1210 11/20/18 1254  BP: 109/63 110/65  109/63  Pulse:  65 67 69  Resp:  (!) 21  17  Temp:  97.7 F (36.5 C)    TempSrc:  Oral    SpO2:  100%  99%  Weight:      Height:        Intake/Output Summary (Last 24 hours) at 11/20/2018 1526 Last data filed at 11/20/2018 1101 Gross per 24 hour  Intake 600 ml  Output 2410 ml  Net -1810 ml   Filed Weights   11/14/18 1802  Weight: 131.8 kg    Examination:  General: No acute distress. Cardiovascular: Heart sounds show Zigmond Trela regular rate, and rhythm.  Lungs: Clear to auscultation bilaterally.  R chest tube. Abdomen: Soft, nontender, nondistended  Neurological: Alert and oriented 3. Moves all extremities 4. Cranial nerves II through XII grossly intact. Skin: Warm and dry. No  rashes or lesions. Extremities: bilateral unna boots   Data Reviewed: I have personally reviewed following labs and imaging studies  CBC: Recent Labs  Lab 11/14/18 0600 11/15/18 0527 11/16/18 0600  11/18/18 0500 11/19/18 0346 11/19/18 0954 11/20/18 0507 11/20/18 1032  WBC 9.3 7.5 9.4  --   --   --  10.7* 8.6  --   HGB 7.7* 7.5* 7.4*   < > 7.5* 7.4* 7.5* 6.4* 7.7*  HCT 24.8* 25.2* 24.2*   < > 24.8* 24.1* 24.8* 21.2* 25.4*  MCV 90.5 91.3 91.7  --   --   --  95.0 94.6  --   PLT 360 347 352  --   --   --  276 212  --    < > = values in this interval not displayed.   Basic Metabolic Panel: Recent Labs  Lab 11/14/18 0600 11/15/18 0527 11/19/18 0954 11/20/18 0507 11/20/18 1032  NA 139  --  138 146*  --   K 4.1  --  3.8 3.6  --   CL 98  --  104 113*  --   CO2 32  --  26 23  --  GLUCOSE 102*  --  122* 75  --   BUN 18  --  18 14  --   CREATININE 0.82  --  0.98 0.75  --   CALCIUM 8.1*  --  8.2* 5.7* 8.4*  MG 2.2 2.2  --  1.5*  --    GFR: Estimated Creatinine Clearance: 156.5 mL/min (by C-G formula based on SCr of 0.75 mg/dL). Liver Function Tests: Recent Labs  Lab 11/14/18 0600 11/20/18 0507 11/20/18 1032  AST 53* 22  --   ALT 88* 30  --   ALKPHOS 118 65  --   BILITOT 1.0 0.8  --   PROT 5.8* 4.3*  --   ALBUMIN 1.4* 1.4* 1.8*   No results for input(s): LIPASE, AMYLASE in the last 168 hours. No results for input(s): AMMONIA in the last 168 hours. Coagulation Profile: No results for input(s): INR, PROTIME in the last 168 hours. Cardiac Enzymes: No results for input(s): CKTOTAL, CKMB, CKMBINDEX, TROPONINI in the last 168 hours. BNP (last 3 results) No results for input(s): PROBNP in the last 8760 hours. HbA1C: No results for input(s): HGBA1C in the last 72 hours. CBG: No results for input(s): GLUCAP in the last 168 hours. Lipid Profile: No results for input(s): CHOL, HDL, LDLCALC, TRIG, CHOLHDL, LDLDIRECT in the last 72 hours. Thyroid Function Tests: No  results for input(s): TSH, T4TOTAL, FREET4, T3FREE, THYROIDAB in the last 72 hours. Anemia Panel: No results for input(s): VITAMINB12, FOLATE, FERRITIN, TIBC, IRON, RETICCTPCT in the last 72 hours. Sepsis Labs: No results for input(s): PROCALCITON, LATICACIDVEN in the last 168 hours.  Recent Results (from the past 240 hour(s))  Culture, blood (Routine X 2) w Reflex to ID Panel     Status: None   Collection Time: 11/12/18  2:59 PM  Result Value Ref Range Status   Specimen Description BLOOD RIGHT ARM  Final   Special Requests   Final    BOTTLES DRAWN AEROBIC ONLY Blood Culture adequate volume   Culture   Final    NO GROWTH 5 DAYS Performed at [facility], [address] [place], [place] [address]    Report Status 11/17/2018 FINAL  Final  Culture, blood (Routine X 2) w Reflex to ID Panel     Status: None   Collection Time: 11/12/18  3:05 PM  Result Value Ref Range Status   Specimen Description BLOOD LEFT HAND  Final   Special Requests   Final    BOTTLES DRAWN AEROBIC ONLY Blood Culture adequate volume   Culture   Final    NO GROWTH 5 DAYS Performed at [facility], [address], [place], [place] [address]    Report Status 11/17/2018 FINAL  Final  Aerobic/Anaerobic Culture (surgical/deep wound)     Status: None   Collection Time: 11/12/18  6:02 PM  Result Value Ref Range Status   Specimen Description PLEURAL  Final   Special Requests RIGHT  Final   Gram Stain   Final    MODERATE WBC PRESENT, PREDOMINANTLY PMN RARE GRAM NEGATIVE RODS    Culture   Final    MODERATE ESCHERICHIA COLI NO ANAEROBES ISOLATED Performed at [facility], [address], [place], [place] [address]    Report Status 11/15/2018 FINAL  Final   Organism ID, Bacteria ESCHERICHIA COLI  Final      Susceptibility   Escherichia coli - MIC*    AMPICILLIN 8 SENSITIVE Sensitive     CEFAZOLIN <=4 SENSITIVE Sensitive  CEFEPIME <=1 SENSITIVE Sensitive     CEFTAZIDIME <=1 SENSITIVE  Sensitive     CEFTRIAXONE <=1 SENSITIVE Sensitive     CIPROFLOXACIN <=0.25 SENSITIVE Sensitive     GENTAMICIN <=1 SENSITIVE Sensitive     IMIPENEM <=0.25 SENSITIVE Sensitive     TRIMETH/SULFA <=20 SENSITIVE Sensitive     AMPICILLIN/SULBACTAM <=2 SENSITIVE Sensitive     PIP/TAZO <=4 SENSITIVE Sensitive     Extended ESBL NEGATIVE Sensitive     * MODERATE ESCHERICHIA COLI  Expectorated sputum assessment w rflx to resp cult     Status: None   Collection Time: 11/12/18  8:24 PM  Result Value Ref Range Status   Specimen Description EXPECTORATED SPUTUM  Final   Special Requests NONE  Final   Sputum evaluation   Final    Sputum specimen not acceptable for testing.  Please recollect.   RESULT CALLED TO, READ BACK BY AND VERIFIED WITH: DAVIS AT 0736 ON [id number] BY SJW Performed at [facility], [address] [place], [place] [address]    Report Status 11/13/2018 FINAL  Final         Radiology Studies: Dg Chest Port 1 View  Result Date: 11/20/2018 CLINICAL DATA:  Follow-up empyema EXAM: PORTABLE CHEST 1 VIEW COMPARISON:  11/19/2018 FINDINGS: Cardiac shadow is enlarged but stable. Left PICC line is again seen and stable. Pigtail catheter is again noted in the right pleural space. Pleural thickening is again noted laterally with mild right basilar atelectasis. No bony abnormality is noted. IMPRESSION: Stable appearance of the chest when compared with the prior exam. Electronically Signed   By: Alcide CleverMark  Lukens M.D.   On: 11/20/2018 08:06   Dg Chest Port 1 View  Result Date: 11/19/2018 CLINICAL DATA:  Pneumonia, empyema and status post right pigtail chest tube placement. EXAM: PORTABLE CHEST 1 VIEW COMPARISON:  11/18/2018 FINDINGS: Stable cardiac enlargement. Stable positioning of right-sided pigtail chest tube. Stable residual small amount right-sided pleural fluid. Stable dense atelectasis and consolidation of the right lower lung. No pneumothorax. Stable positioning of left-sided PICC line  with the catheter tip in the distal SVC. IMPRESSION: Stable chest x-ray demonstrating small amount of residual right pleural fluid and dense consolidation of the right lower lung. Electronically Signed   By: Irish LackGlenn  Yamagata M.D.   On: 11/19/2018 07:47   Dg Chest Port 1 View  Result Date: 11/18/2018 CLINICAL DATA:  Air leak EXAM: PORTABLE CHEST 1 VIEW COMPARISON:  11/18/2018, 11/17/2018, 11/12/2018 FINDINGS: Left upper extremity catheter tip overlies the cavoatrial region. Similar positioning of right-sided chest tube. Similar residual right pleural thickening. Increased peripheral lucency, which may reflect air in the pleural space. Consolidation at the right base. Enlarged cardiomediastinal silhouette. IMPRESSION: 1. Similar appearance of right pleural effusion or thickening and right basilar infiltrates. 2. There may be slight increased peripheral lucency in the right thorax which may reflect increased air in the pleural space. Electronically Signed   By: Jasmine PangKim  Fujinaga M.D.   On: 11/18/2018 18:34        Scheduled Meds:  sodium chloride   Intravenous Once   amiodarone  200 mg Oral Daily   apixaban  5 mg Oral BID   carvedilol  3.125 mg Oral BID WC   digoxin  0.125 mg Oral Daily   feeding supplement (ENSURE ENLIVE)  237 mL Oral TID BM   furosemide  40 mg Oral Daily   pantoprazole  40 mg Oral BID AC   polyethylene glycol  17 g Oral BID  rosuvastatin  20 mg Oral q1800   Continuous Infusions:  ampicillin (OMNIPEN) IV 2 g (11/20/18 1224)   magnesium sulfate 1 - 4 g bolus IVPB       LOS: 8 days    Time spent: over 30 min    Lacretia Nicks, MD [facility] Pager AMION  If 7PM-7AM, please contact night-coverage [email or web address] Password [facility] 11/20/2018, 3:26 PM

## 2018-11-20 NOTE — Progress Notes (Signed)
Notified by CCMD that patinet's HR dropped to 33. Patient resting and asymptomatic. Will continue to monitor

## 2018-11-20 NOTE — Progress Notes (Signed)
Orthopedic Tech Progress Note Patient Details:  Aaron [name] 25-Apr-1966 [id number]  CPM Left Knee CPM Left Knee: On Left Knee Flexion (Degrees): 90 Left Knee Extension (Degrees): 0 CPM Right Knee CPM Right Knee: Off Right Knee Flexion (Degrees): 90 Right Knee Extension (Degrees): 0  Post Interventions Patient Tolerated: Well Instructions Provided: Care of device, Adjustment of device  [name] 11/20/2018, 1:05 PM

## 2018-11-20 NOTE — Progress Notes (Signed)
Orthopedic Tech Progress Note Patient Details:  Aaron [name] 1965/10/30 [id number] RN called and asked me to remove CPM so patient could use restroom. And once finish, he wants to apply the CPM to other leg. [name], male   DOB: 1965-10-11, 53 y.o.   MRN: [id number]   Aaron [name] 11/20/2018, 10:39 AM

## 2018-11-21 ENCOUNTER — Inpatient Hospital Stay (HOSPITAL_COMMUNITY): Payer: Medicaid Other

## 2018-11-21 LAB — CALCIUM: Calcium: 8.4 mg/dL — ABNORMAL LOW (ref 8.9–10.3)

## 2018-11-21 LAB — HEMOGLOBIN AND HEMATOCRIT, BLOOD
HCT: 24.3 % — ABNORMAL LOW (ref 39.0–52.0)
Hemoglobin: 7.5 g/dL — ABNORMAL LOW (ref 13.0–17.0)

## 2018-11-21 MED ORDER — FERROUS SULFATE 325 (65 FE) MG PO TABS
325.0000 mg | ORAL_TABLET | Freq: Three times a day (TID) | ORAL | Status: DC
  Administered 2018-11-21 – 2018-11-25 (×13): 325 mg via ORAL
  Filled 2018-11-21 (×12): qty 1

## 2018-11-21 MED ORDER — SODIUM CHLORIDE 0.9 % IV SOLN
510.0000 mg | Freq: Once | INTRAVENOUS | Status: AC
  Administered 2018-11-21: 510 mg via INTRAVENOUS
  Filled 2018-11-21: qty 17

## 2018-11-21 NOTE — Progress Notes (Addendum)
PROGRESS NOTE    Aaron [name]  UTM:[id number] DOB: 02-Apr-1966 DOA: 11/12/2018 PCP: [facility] [place]  Brief Narrative: 53 year old male with no significant past medical history until her recent prolonged hospitalization from 09/30/20/9/20 for new onset ischemic CHF (ICM), three-vessel CAD (not a candidate for CABG per CTS), new onset A. Fib, spontaneous right thigh hematoma and GI bleed. His hospital course was complicated by a dynamic ileus/SBO requiring NG tube. He was discharged to CIR, and returns with progressive weakness and dyspnea over the last 2 to 3 days.  He had CXR that was concerning for hydro-versus hemothorax for which he was admitted. CT chest was obtained and concerning for empyema. Cardiothoracic surgery consulted by admitting provider and recommended pigtail placement by IR for potential thrombolytics in few days. He was also started on Vanco and Zosyn for possible HAP/parapneumonic effusion.  Patient had chest tube placed later the day of admission.Also has had thrombolytics. Chest tube drained about 4 L of purulent material since then. Culture grew pansensitive E. Coli. Transitioned to ampicillin on 11/15/2018.  Patient stable.  Chest tube remains in place.  CT surgery and IR following.  Transfer to cardiac telemetry.  Assessment & Plan:   Active Problems:   Acute systolic CHF (congestive heart failure) (HCC)   Acute hypoxemic respiratory failure (HCC)   Morbid obesity (HCC)   Atrial fibrillation with rapid ventricular response (HCC)   Hypokalemia   Protein-calorie malnutrition, severe   Hematoma of right thigh   Acute blood loss anemia   Orthostatic hypotension   Empyema (HCC)  Right Sided Empyema  Dyspnea/fatigue  - likely due to pleural effusion/parapneumonic effusion-improving. -Status post chest tube placement by IR on 4/16 and thrombolytics.  -Chest tube drained~110 ccyesterday none documented today -No fluid cytology or  chemistry sent.  -Culture grew pansensitive E. Coli. Blood cultures NGTD -Vancomycin and Zosyn for HAP-4/16-4/19 -Continued onIV ampicillin 4/19-- -IR and CTSfollowing -Per CTS, continue chest tube  Awaiting recs from Dr. Dorris Fetch.  Chronic systolic CHF/ICM: Echo with EF of 15%. Appears euvolemic.No cardiopulmonary symptomsat this time -Continue home Lasix-good urine output. -ARB on hold due tosoft blood pressures. -Daily weight, intake output and renal function.  History of three-vessel CAD:not a candidate for CABG per CTSduring previous hospitalization-stable. -Continue home medications (low-dose Coreg and Crestor)as tolerated  History of blood loss anemia/GI bleed:  Hb this morning 7.5-Continue with PPI as tolerated. Stable for now -feraheme today  Start po iron   Paroxysmal A. fib without RVR -Continue home Eliquis, amiodarone, digoxin and Coreg - Per cards, amiodarone reduced to 200 mg daily.  Please f/u with cardiology when nearing discharge to ensure follow up.   Bradycardia: - Pt currently on carvedilol and digoxin.  HR currently in the 60's.   - Had bradycardia noted overnight - For now, will continue carvedilol.  Hold digoxin.    Elevated liver enzymes: Resolved  Protein calorie malnutrition: BMI 36. Due to chronic illness. -Appreciate dietitian input-continue supplements.  DVT prophylaxis: eliquis Code Status: full  Family Communication: Discussed with patient he reported that I do not have to call his wife and that he FaceTime his family every day. Disposition Plan: pending   Consultants:   CT surgery  IR  Cardiology  Procedures:  Chest tube 4/16  Antibiotics ampicillin    Pressure Injury 11/03/18 Stage II -  Partial thickness loss of dermis presenting as a shallow open ulcer with a red, pink wound bed without slough. (Active)  11/03/18 1500  Location: Buttocks  Location Orientation: Right  Staging: Stage II -  Partial  thickness loss of dermis presenting as a shallow open ulcer with a red, pink wound bed without slough.  Wound Description (Comments):   Present on Admission: No     Pressure Injury 11/05/18 Stage II -  Partial thickness loss of dermis presenting as a shallow open ulcer with a red, pink wound bed without slough. Scabbed pressure injury to right nare from NG tube (Active)  11/05/18 1758  Location: Nare  Location Orientation: Right;Medial  Staging: Stage II -  Partial thickness loss of dermis presenting as a shallow open ulcer with a red, pink wound bed without slough.  Wound Description (Comments): Scabbed pressure injury to right nare from NG tube  Present on Admission: Yes      Nutrition Problem: Increased nutrient needs Etiology: acute illness     Signs/Symptoms: estimated needs    Interventions: Ensure Enlive (each supplement provides 350kcal and 20 grams of protein)  Estimated body mass index is 35.37 kg/m as calculated from the following:   Height as of this encounter: 6\' 4"  (1.93 m).   Weight as of this encounter: 131.8 kg.    Subjective: Very pleasant young man in no acute distress feels breathing is better still has a chest tube in place on the right side condom cath in place.  Sits up in the chair during the daytime.  Objective: Vitals:   11/20/18 1254 11/20/18 1934 11/20/18 2300 11/21/18 0401  BP: 109/63 113/69  (!) 104/56  Pulse: 69 71 (S) (!) 44 66  Resp: 17 16 18 16   Temp:  98 F (36.7 C)  97.8 F (36.6 C)  TempSrc:  Oral  Oral  SpO2: 99% 98% 100% 97%  Weight:      Height:        Intake/Output Summary (Last 24 hours) at 11/21/2018 0943 Last data filed at 11/21/2018 0549 Gross per 24 hour  Intake 400 ml  Output 1650 ml  Net -1250 ml   Filed Weights   11/14/18 1802  Weight: 131.8 kg    Examination:  General exam: Appears calm and comfortable  Respiratory system: Clear to auscultation. Respiratory effort normal.chest tube in place   Cardiovascular system: S1 & S2 heard, RRR. No JVD, murmurs, rubs, gallops or clicks. No pedal edema. Gastrointestinal system: Abdomen is nondistended, soft and nontender. No organomegaly or masses felt. Normal bowel sounds heard. Central nervous system: Alert and oriented. No focal neurological deficits. Extremities: Symmetric 5 x 5 power. Skin: No rashes, lesions or ulcers Psychiatry: Judgement and insight appear normal. Mood & affect appropriate.     Data Reviewed: I have personally reviewed following labs and imaging studies  CBC: Recent Labs  Lab 11/15/18 0527 11/16/18 0600  11/19/18 0346 11/19/18 0954 11/20/18 0507 11/20/18 1032 11/21/18 0348  WBC 7.5 9.4  --   --  10.7* 8.6  --   --   HGB 7.5* 7.4*   < > 7.4* 7.5* 6.4* 7.7* 7.5*  HCT 25.2* 24.2*   < > 24.1* 24.8* 21.2* 25.4* 24.3*  MCV 91.3 91.7  --   --  95.0 94.6  --   --   PLT 347 352  --   --  276 212  --   --    < > = values in this interval not displayed.   Basic Metabolic Panel: Recent Labs  Lab 11/15/18 0527 11/19/18 0954 11/20/18 0507 11/20/18 1032  NA  --  138 146*  --  K  --  3.8 3.6  --   CL  --  104 113*  --   CO2  --  26 23  --   GLUCOSE  --  122* 75  --   BUN  --  18 14  --   CREATININE  --  0.98 0.75  --   CALCIUM  --  8.2* 5.7* 8.4*  MG 2.2  --  1.5*  --    GFR: Estimated Creatinine Clearance: 156.5 mL/min (by C-G formula based on SCr of 0.75 mg/dL). Liver Function Tests: Recent Labs  Lab 11/20/18 0507 11/20/18 1032  AST 22  --   ALT 30  --   ALKPHOS 65  --   BILITOT 0.8  --   PROT 4.3*  --   ALBUMIN 1.4* 1.8*   No results for input(s): LIPASE, AMYLASE in the last 168 hours. No results for input(s): AMMONIA in the last 168 hours. Coagulation Profile: No results for input(s): INR, PROTIME in the last 168 hours. Cardiac Enzymes: No results for input(s): CKTOTAL, CKMB, CKMBINDEX, TROPONINI in the last 168 hours. BNP (last 3 results) No results for input(s): PROBNP in the last  8760 hours. HbA1C: No results for input(s): HGBA1C in the last 72 hours. CBG: No results for input(s): GLUCAP in the last 168 hours. Lipid Profile: No results for input(s): CHOL, HDL, LDLCALC, TRIG, CHOLHDL, LDLDIRECT in the last 72 hours. Thyroid Function Tests: No results for input(s): TSH, T4TOTAL, FREET4, T3FREE, THYROIDAB in the last 72 hours. Anemia Panel: No results for input(s): VITAMINB12, FOLATE, FERRITIN, TIBC, IRON, RETICCTPCT in the last 72 hours. Sepsis Labs: No results for input(s): PROCALCITON, LATICACIDVEN in the last 168 hours.  Recent Results (from the past 240 hour(s))  Culture, blood (Routine X 2) w Reflex to ID Panel     Status: None   Collection Time: 11/12/18  2:59 PM  Result Value Ref Range Status   Specimen Description BLOOD RIGHT ARM  Final   Special Requests   Final    BOTTLES DRAWN AEROBIC ONLY Blood Culture adequate volume   Culture   Final    NO GROWTH 5 DAYS Performed at [facility], [address], [place], [place] [address]    Report Status 11/17/2018 FINAL  Final  Culture, blood (Routine X 2) w Reflex to ID Panel     Status: None   Collection Time: 11/12/18  3:05 PM  Result Value Ref Range Status   Specimen Description BLOOD LEFT HAND  Final   Special Requests   Final    BOTTLES DRAWN AEROBIC ONLY Blood Culture adequate volume   Culture   Final    NO GROWTH 5 DAYS Performed at [facility], [address], [place], [place] [address]    Report Status 11/17/2018 FINAL  Final  Aerobic/Anaerobic Culture (surgical/deep wound)     Status: None   Collection Time: 11/12/18  6:02 PM  Result Value Ref Range Status   Specimen Description PLEURAL  Final   Special Requests RIGHT  Final   Gram Stain   Final    MODERATE WBC PRESENT, PREDOMINANTLY PMN RARE GRAM NEGATIVE RODS    Culture   Final    MODERATE ESCHERICHIA COLI NO ANAEROBES ISOLATED Performed at [facility], [address], [place], [place] [address]    Report Status  11/15/2018 FINAL  Final   Organism ID, Bacteria ESCHERICHIA COLI  Final      Susceptibility   Escherichia  coli - MIC*    AMPICILLIN 8 SENSITIVE Sensitive     CEFAZOLIN <=4 SENSITIVE Sensitive     CEFEPIME <=1 SENSITIVE Sensitive     CEFTAZIDIME <=1 SENSITIVE Sensitive     CEFTRIAXONE <=1 SENSITIVE Sensitive     CIPROFLOXACIN <=0.25 SENSITIVE Sensitive     GENTAMICIN <=1 SENSITIVE Sensitive     IMIPENEM <=0.25 SENSITIVE Sensitive     TRIMETH/SULFA <=20 SENSITIVE Sensitive     AMPICILLIN/SULBACTAM <=2 SENSITIVE Sensitive     PIP/TAZO <=4 SENSITIVE Sensitive     Extended ESBL NEGATIVE Sensitive     * MODERATE ESCHERICHIA COLI  Expectorated sputum assessment w rflx to resp cult     Status: None   Collection Time: 11/12/18  8:24 PM  Result Value Ref Range Status   Specimen Description EXPECTORATED SPUTUM  Final   Special Requests NONE  Final   Sputum evaluation   Final    Sputum specimen not acceptable for testing.  Please recollect.   RESULT CALLED TO, READ BACK BY AND VERIFIED WITH: DAVIS AT 0736 ON [id number] BY SJW Performed at [facility], [address], [place], [place] [address]    Report Status 11/13/2018 FINAL  Final         Radiology Studies: Dg Chest Port 1 View  Result Date: 11/21/2018 CLINICAL DATA:  Right chest tube evaluation. EXAM: PORTABLE CHEST 1 VIEW COMPARISON:  November 20, 2018 FINDINGS: The right chest tube is stable. Right pleural fluid and associated right basilar opacity are stable. The left PICC line terminates in the central SVC. Stable cardiomegaly. The hila and mediastinum are unchanged. Visualized left lung is unremarkable. The lateral left lung base was not included on today's study. IMPRESSION: 1. Support apparatus as above. 2. Right pleural effusion and associated right basilar opacity are stable. 3. No other interval changes. The lateral left lung base was not included on today's study. Electronically Signed   By: Gerome Sam III M.D   On:  11/21/2018 07:01   Dg Chest Port 1 View  Result Date: 11/20/2018 CLINICAL DATA:  Follow-up empyema EXAM: PORTABLE CHEST 1 VIEW COMPARISON:  11/19/2018 FINDINGS: Cardiac shadow is enlarged but stable. Left PICC line is again seen and stable. Pigtail catheter is again noted in the right pleural space. Pleural thickening is again noted laterally with mild right basilar atelectasis. No bony abnormality is noted. IMPRESSION: Stable appearance of the chest when compared with the prior exam. Electronically Signed   By: Alcide Clever M.D.   On: 11/20/2018 08:06        Scheduled Meds: . sodium chloride   Intravenous Once  . amiodarone  200 mg Oral Daily  . apixaban  5 mg Oral BID  . carvedilol  3.125 mg Oral BID WC  . feeding supplement (ENSURE ENLIVE)  237 mL Oral TID BM  . furosemide  40 mg Oral Daily  . pantoprazole  40 mg Oral BID AC  . polyethylene glycol  17 g Oral BID  . rosuvastatin  20 mg Oral q1800   Continuous Infusions: . ampicillin (OMNIPEN) IV 2 g (11/21/18 0529)  . magnesium sulfate 1 - 4 g bolus IVPB       LOS: 9 days     Alwyn Ren, MD [facility]  If 7PM-7AM, please contact night-coverage [email or web address] Password [facility] 11/21/2018, 9:43 AM

## 2018-11-21 NOTE — Progress Notes (Signed)
Patient continues to have occasional drops in HR into the 30's. Patient resting and asymptomatic. Will continue to monitor

## 2018-11-21 NOTE — Progress Notes (Signed)
  Subjective: No complaints this AM Says he is getting more feeling back in his leg  Objective: Vital signs in last 24 hours: Temp:  [97.8 F (36.6 C)-98 F (36.7 C)] 97.8 F (36.6 C) (04/25 0401) Pulse Rate:  [44-71] 66 (04/25 0401) Cardiac Rhythm: Atrial fibrillation (04/25 0713) Resp:  [16-18] 16 (04/25 0401) BP: (104-113)/(56-69) 104/56 (04/25 0401) SpO2:  [97 %-100 %] 97 % (04/25 0401)  Hemodynamic parameters for last 24 hours:    Intake/Output from previous day: 04/24 0701 - 04/25 0700 In: 600 [P.O.:200; IV Piggyback:400] Out: 1650 [Urine:1650] Intake/Output this shift: No intake/output data recorded.  General appearance: alert, cooperative and no distress Neurologic: intact Heart: irregularly irregular rhythm Lungs: clear anteriorly minimal air leak  Lab Results: Recent Labs    11/19/18 0954 11/20/18 0507 11/20/18 1032 11/21/18 0348  WBC 10.7* 8.6  --   --   HGB 7.5* 6.4* 7.7* 7.5*  HCT 24.8* 21.2* 25.4* 24.3*  PLT 276 212  --   --    BMET:  Recent Labs    11/19/18 0954 11/20/18 0507 11/20/18 1032  NA 138 146*  --   K 3.8 3.6  --   CL 104 113*  --   CO2 26 23  --   GLUCOSE 122* 75  --   BUN 18 14  --   CREATININE 0.98 0.75  --   CALCIUM 8.2* 5.7* 8.4*    PT/INR: No results for input(s): LABPROT, INR in the last 72 hours. ABG    Component Value Date/Time   PHART 7.461 (H) 10/09/2018 1047   HCO3 31.3 (H) 10/09/2018 1051   TCO2 33 (H) 10/09/2018 1051   O2SAT 76.7 10/26/2018 0410   CBG (last 3)  No results for input(s): GLUCAP in the last 72 hours.  Assessment/Plan:  Stable from a respiratory standpoint Still has a minimal intermittent air leak, but clears with repeated cough, CXR stable, minimal serous drainage in tubing Continue with CT to water seal   LOS: 9 days    Loreli Slot 11/21/2018

## 2018-11-21 NOTE — Plan of Care (Signed)
  Problem: Clinical Measurements: Goal: Diagnostic test results will improve Outcome: Progressing Goal: Respiratory complications will improve Outcome: Progressing   Problem: Health Behavior/Discharge Planning: Goal: Ability to manage health-related needs will improve Outcome: Not Progressing   

## 2018-11-21 NOTE — Plan of Care (Signed)
Care plans reviewed and patient is progressing.  

## 2018-11-21 NOTE — Progress Notes (Signed)
Patient sleeping and H.R. drop to 31 At. Fib then immed. Came back up to 68 At. Fib.

## 2018-11-22 LAB — CBC WITH DIFFERENTIAL/PLATELET
Abs Immature Granulocytes: 0.14 10*3/uL — ABNORMAL HIGH (ref 0.00–0.07)
Basophils Absolute: 0.1 10*3/uL (ref 0.0–0.1)
Basophils Relative: 1 %
Eosinophils Absolute: 0.1 10*3/uL (ref 0.0–0.5)
Eosinophils Relative: 1 %
HCT: 24.9 % — ABNORMAL LOW (ref 39.0–52.0)
Hemoglobin: 7.6 g/dL — ABNORMAL LOW (ref 13.0–17.0)
Immature Granulocytes: 2 %
Lymphocytes Relative: 14 %
Lymphs Abs: 1.3 10*3/uL (ref 0.7–4.0)
MCH: 29.1 pg (ref 26.0–34.0)
MCHC: 30.5 g/dL (ref 30.0–36.0)
MCV: 95.4 fL (ref 80.0–100.0)
Monocytes Absolute: 0.7 10*3/uL (ref 0.1–1.0)
Monocytes Relative: 8 %
Neutro Abs: 6.7 10*3/uL (ref 1.7–7.7)
Neutrophils Relative %: 74 %
Platelets: 241 10*3/uL (ref 150–400)
RBC: 2.61 MIL/uL — ABNORMAL LOW (ref 4.22–5.81)
RDW: 20.8 % — ABNORMAL HIGH (ref 11.5–15.5)
WBC: 9 10*3/uL (ref 4.0–10.5)
nRBC: 0 % (ref 0.0–0.2)

## 2018-11-22 LAB — BASIC METABOLIC PANEL
Anion gap: 7 (ref 5–15)
BUN: 18 mg/dL (ref 6–20)
CO2: 26 mmol/L (ref 22–32)
Calcium: 8.2 mg/dL — ABNORMAL LOW (ref 8.9–10.3)
Chloride: 105 mmol/L (ref 98–111)
Creatinine, Ser: 0.99 mg/dL (ref 0.61–1.24)
GFR calc Af Amer: >60 mL/min (ref >60)
GFR calc non Af Amer: >60 mL/min (ref >60)
Glucose, Bld: 93 mg/dL (ref 70–99)
Potassium: 3.8 mmol/L (ref 3.5–5.1)
Sodium: 138 mmol/L (ref 135–145)

## 2018-11-22 MED ORDER — CARVEDILOL 3.125 MG PO TABS
3.1250 mg | ORAL_TABLET | Freq: Every day | ORAL | Status: DC
  Administered 2018-11-23 – 2018-11-25 (×3): 3.125 mg via ORAL
  Filled 2018-11-22 (×3): qty 1

## 2018-11-22 NOTE — Progress Notes (Signed)
  Subjective: No complaints  Objective: Vital signs in last 24 hours: Temp:  [97.6 F (36.4 C)-98.1 F (36.7 C)] 98.1 F (36.7 C) (04/26 0700) Pulse Rate:  [64-74] 64 (04/26 0800) Cardiac Rhythm: Atrial fibrillation (04/26 0721) Resp:  [21-30] 24 (04/26 0800) BP: (91-108)/(61-73) 108/73 (04/26 0700) SpO2:  [92 %-100 %] 99 % (04/26 0800) Weight:  [122.5 kg] 122.5 kg (04/26 0700)  Hemodynamic parameters for last 24 hours:    Intake/Output from previous day: 04/25 0701 - 04/26 0700 In: 1831.2 [P.O.:1194; I.V.:120; IV Piggyback:517.2] Out: 1250 [Urine:1200; Chest Tube:50] Intake/Output this shift: Total I/O In: 150 [P.O.:150] Out: 450 [Urine:450]  General appearance: alert, cooperative and no distress Neurologic: intact Heart: regular rate and rhythm Lungs: diminished breath sounds bibasilar minimal air leak  Lab Results: Recent Labs    11/20/18 0507  11/21/18 0348 11/22/18 0323  WBC 8.6  --   --  9.0  HGB 6.4*   < > 7.5* 7.6*  HCT 21.2*   < > 24.3* 24.9*  PLT 212  --   --  241   < > = values in this interval not displayed.   BMET:  Recent Labs    11/20/18 0507 11/20/18 1032 11/22/18 0323  NA 146*  --  138  K 3.6  --  3.8  CL 113*  --  105  CO2 23  --  26  GLUCOSE 75  --  93  BUN 14  --  18  CREATININE 0.75  --  0.99  CALCIUM 5.7* 8.4* 8.2*    PT/INR: No results for input(s): LABPROT, INR in the last 72 hours. ABG    Component Value Date/Time   PHART 7.461 (H) 10/09/2018 1047   HCO3 31.3 (H) 10/09/2018 1051   TCO2 33 (H) 10/09/2018 1051   O2SAT 76.7 10/26/2018 0410   CBG (last 3)  No results for input(s): GLUCAP in the last 72 hours.  Assessment/Plan: S/P  - Still has minimal air leak, will consider trial of clamping tube if it does not resolve completely in next day or two. Output remains minimal    LOS: 10 days    Loreli Slot 11/22/2018

## 2018-11-22 NOTE — Progress Notes (Signed)
PROGRESS NOTE    Aaron BaliGlenn [name]  JYN:[id number] DOB: 1966-03-20 DOA: 11/12/2018 PCP: Darrin [facility], [facility] [place]  Brief Narrative: 53 year old male with no significant past medical history until her recent prolonged hospitalization from 09/30/20/9/20 for new onset ischemic CHF (ICM), three-vessel CAD (not a candidate for CABG per CTS), new onset A. Fib, spontaneous right thigh hematoma and GI bleed. His hospital course was complicated by a dynamic ileus/SBO requiring NG tube. He was discharged to CIR, and returns with progressive weakness and dyspnea over the last 2 to 3 days.  He had CXR that was concerning for hydro-versus hemothorax for which he was admitted. CT chest was obtained and concerning for empyema. Cardiothoracic surgery consulted by admitting provider and recommended pigtail placement by IR for potential thrombolytics in few days. He was also started on Vanco and Zosyn for possible HAP/parapneumonic effusion.  Patient had chest tube placed later the day of admission.Also has had thrombolytics. Chest tube drained about 4 L of purulent material since then. Culture grew pansensitive E. Coli. Transitioned to ampicillin on 11/15/2018.  Patient stable. Chest tube remains in place. CT surgery and IR following. Transfer to cardiac telemetry.   Assessment & Plan:   Active Problems:   Acute systolic CHF (congestive heart failure) (HCC)   Acute hypoxemic respiratory failure (HCC)   Morbid obesity (HCC)   Atrial fibrillation with rapid ventricular response (HCC)   Hypokalemia   Protein-calorie malnutrition, severe   Hematoma of right thigh   Acute blood loss anemia   Orthostatic hypotension   Empyema of pleural space (HCC)   Right Sided Empyema  Dyspnea/fatigue  - likely due to pleural effusion/parapneumonic effusion-improving. -Status post chest tube placement by IR on 4/16 and thrombolytics.  -Chest tube drained~50 cc down from 110 cc previous day.  -No  fluid cytology or chemistry sent.  -Culture grew pansensitive E. Coli.Blood cultures NGTD -Vancomycin and Zosyn for HAP-4/16-4/19 -Continued onIV ampicillin 4/19-- -IR and CTSfollowing -Per CTS, continue chest tube , considering clamping of the tube early next week Follow-up chest x-ray tomorrow He is also on Lasix 40 mg daily.  Chronic systolic CHF/ICM: Echo with EF of 15%. Appears euvolemic.No cardiopulmonary symptomsat this time -Continue home Lasix-good urine output. -ARB on hold due tosoft blood pressures. -Daily weight, intake output and renal function.  History of three-vessel CAD:not a candidate for CABG per CTSduring previous hospitalization-stable. -Continue home medications (low-dose Coreg and Crestor)as tolerated  History of blood loss anemia/GI bleed: Hb this morning 7.6-Continue with PPI as tolerated. Stable for now -feraheme 4/26 Started po iron   Paroxysmal A. fib without RVR -Continue home Eliquis, amiodarone, digoxin and Coreg - Per cards, amiodarone reduced to 200 mg daily. Please f/u with cardiology when nearing discharge to ensure follow up.  Bradycardia: - Pt currently on carvedilol amiodarone heart rate intermittently dipping into the 30s overnight.  Will decrease the dose of Coreg.  Digoxin has been stopped.  Continue amiodarone and monitor.   Elevated liver enzymes: Resolved  Protein calorie malnutrition: BMI 36. Due to chronic illness. -Appreciate dietitian input-continue supplements.  DVT prophylaxis:eliquis Code Status:full  Family Communication: Discussed with patient he reported that I do not have to call his wife and that he FaceTime his family every day. Disposition Plan:pending   Consultants:  CT surgery  IR  Cardiology  Procedures: Chest tube 4/16  Antibiotics ampicillin  Pressure Injury 11/03/18 Stage II -  Partial thickness loss of dermis presenting as a shallow open ulcer with a red, pink wound  bed without slough. (Active)  11/03/18 1500  Location: Buttocks  Location Orientation: Right  Staging: Stage II -  Partial thickness loss of dermis presenting as a shallow open ulcer with a red, pink wound bed without slough.  Wound Description (Comments):   Present on Admission: No     Pressure Injury 11/05/18 Stage II -  Partial thickness loss of dermis presenting as a shallow open ulcer with a red, pink wound bed without slough. Scabbed pressure injury to right nare from NG tube (Active)  11/05/18 1758  Location: Nare  Location Orientation: Right;Medial  Staging: Stage II -  Partial thickness loss of dermis presenting as a shallow open ulcer with a red, pink wound bed without slough.  Wound Description (Comments): Scabbed pressure injury to right nare from NG tube  Present on Admission: Yes      Nutrition Problem: Increased nutrient needs Etiology: acute illness     Signs/Symptoms: estimated needs    Interventions: Ensure Enlive (each supplement provides 350kcal and 20 grams of protein)  Estimated body mass index is 32.87 kg/m as calculated from the following:   Height as of this encounter: 6\' 4"  (1.93 m).   Weight as of this encounter: 122.5 kg.   Subjective: Sitting in bed in no acute distress feels well  Objective: Vitals:   11/21/18 2149 11/21/18 2347 11/22/18 0700 11/22/18 0800  BP: 91/61  108/73   Pulse: 74 65 65 64  Resp: (!) 30 (!) 23 (!) 21 (!) 24  Temp: 97.6 F (36.4 C)  98.1 F (36.7 C)   TempSrc: Oral  Oral   SpO2: 92% 97% 97% 99%  Weight:   122.5 kg   Height:        Intake/Output Summary (Last 24 hours) at 11/22/2018 1224 Last data filed at 11/22/2018 0800 Gross per 24 hour  Intake 1384.22 ml  Output 1700 ml  Net -315.78 ml   Filed Weights   11/14/18 1802 11/22/18 0700  Weight: 131.8 kg 122.5 kg    Examination:  General exam: Appears calm and comfortable  Respiratory system: Diminished breath sounds at the bases to auscultation.  Respiratory effort normal. Cardiovascular system: S1 & S2 heard, RRR. No JVD, murmurs, rubs, gallops or clicks. No pedal edema. Gastrointestinal system: Abdomen is nondistended, soft and nontender. No organomegaly or masses felt. Normal bowel sounds heard. Central nervous system: Alert and oriented. No focal neurological deficits. Extremities: Symmetric 5 x 5 power. Skin: No rashes, lesions or ulcers Psychiatry: Judgement and insight appear normal. Mood & affect appropriate.     Data Reviewed: I have personally reviewed following labs and imaging studies  CBC: Recent Labs  Lab 11/16/18 0600  11/19/18 0954 11/20/18 0507 11/20/18 1032 11/21/18 0348 11/22/18 0323  WBC 9.4  --  10.7* 8.6  --   --  9.0  NEUTROABS  --   --   --   --   --   --  6.7  HGB 7.4*   < > 7.5* 6.4* 7.7* 7.5* 7.6*  HCT 24.2*   < > 24.8* 21.2* 25.4* 24.3* 24.9*  MCV 91.7  --  95.0 94.6  --   --  95.4  PLT 352  --  276 212  --   --  241   < > = values in this interval not displayed.   Basic Metabolic Panel: Recent Labs  Lab 11/19/18 0954 11/20/18 0507 11/20/18 1032 11/22/18 0323  NA 138 146*  --  138  K 3.8 3.6  --  3.8  CL 104 113*  --  105  CO2 26 23  --  26  GLUCOSE 122* 75  --  93  BUN 18 14  --  18  CREATININE 0.98 0.75  --  0.99  CALCIUM 8.2* 5.7* 8.4* 8.2*  MG  --  1.5*  --   --    GFR: Estimated Creatinine Clearance: 122 mL/min (by C-G formula based on SCr of 0.99 mg/dL). Liver Function Tests: Recent Labs  Lab 11/20/18 0507 11/20/18 1032  AST 22  --   ALT 30  --   ALKPHOS 65  --   BILITOT 0.8  --   PROT 4.3*  --   ALBUMIN 1.4* 1.8*   No results for input(s): LIPASE, AMYLASE in the last 168 hours. No results for input(s): AMMONIA in the last 168 hours. Coagulation Profile: No results for input(s): INR, PROTIME in the last 168 hours. Cardiac Enzymes: No results for input(s): CKTOTAL, CKMB, CKMBINDEX, TROPONINI in the last 168 hours. BNP (last 3 results) No results for input(s):  PROBNP in the last 8760 hours. HbA1C: No results for input(s): HGBA1C in the last 72 hours. CBG: No results for input(s): GLUCAP in the last 168 hours. Lipid Profile: No results for input(s): CHOL, HDL, LDLCALC, TRIG, CHOLHDL, LDLDIRECT in the last 72 hours. Thyroid Function Tests: No results for input(s): TSH, T4TOTAL, FREET4, T3FREE, THYROIDAB in the last 72 hours. Anemia Panel: No results for input(s): VITAMINB12, FOLATE, FERRITIN, TIBC, IRON, RETICCTPCT in the last 72 hours. Sepsis Labs: No results for input(s): PROCALCITON, LATICACIDVEN in the last 168 hours.  Recent Results (from the past 240 hour(s))  Culture, blood (Routine X 2) w Reflex to ID Panel     Status: None   Collection Time: 11/12/18  2:59 PM  Result Value Ref Range Status   Specimen Description BLOOD RIGHT ARM  Final   Special Requests   Final    BOTTLES DRAWN AEROBIC ONLY Blood Culture adequate volume   Culture   Final    NO GROWTH 5 DAYS Performed at [facility], [address], [place], [place] [address]    Report Status 11/17/2018 FINAL  Final  Culture, blood (Routine X 2) w Reflex to ID Panel     Status: None   Collection Time: 11/12/18  3:05 PM  Result Value Ref Range Status   Specimen Description BLOOD LEFT HAND  Final   Special Requests   Final    BOTTLES DRAWN AEROBIC ONLY Blood Culture adequate volume   Culture   Final    NO GROWTH 5 DAYS Performed at [facility], [address], [place], [place] [address]    Report Status 11/17/2018 FINAL  Final  Aerobic/Anaerobic Culture (surgical/deep wound)     Status: None   Collection Time: 11/12/18  6:02 PM  Result Value Ref Range Status   Specimen Description PLEURAL  Final   Special Requests RIGHT  Final   Gram Stain   Final    MODERATE WBC PRESENT, PREDOMINANTLY PMN RARE GRAM NEGATIVE RODS    Culture   Final    MODERATE ESCHERICHIA COLI NO ANAEROBES ISOLATED Performed at [facility], [address] [place], [place] [address]     Report Status 11/15/2018 FINAL  Final   Organism ID, Bacteria ESCHERICHIA COLI  Final      Susceptibility   Escherichia coli - MIC*    AMPICILLIN 8 SENSITIVE Sensitive     CEFAZOLIN <=4 SENSITIVE Sensitive  CEFEPIME <=1 SENSITIVE Sensitive     CEFTAZIDIME <=1 SENSITIVE Sensitive     CEFTRIAXONE <=1 SENSITIVE Sensitive     CIPROFLOXACIN <=0.25 SENSITIVE Sensitive     GENTAMICIN <=1 SENSITIVE Sensitive     IMIPENEM <=0.25 SENSITIVE Sensitive     TRIMETH/SULFA <=20 SENSITIVE Sensitive     AMPICILLIN/SULBACTAM <=2 SENSITIVE Sensitive     PIP/TAZO <=4 SENSITIVE Sensitive     Extended ESBL NEGATIVE Sensitive     * MODERATE ESCHERICHIA COLI  Expectorated sputum assessment w rflx to resp cult     Status: None   Collection Time: 11/12/18  8:24 PM  Result Value Ref Range Status   Specimen Description EXPECTORATED SPUTUM  Final   Special Requests NONE  Final   Sputum evaluation   Final    Sputum specimen not acceptable for testing.  Please recollect.   RESULT CALLED TO, READ BACK BY AND VERIFIED WITH: DAVIS AT 0736 ON [id number] BY SJW Performed at [facility], [address], [place], [place] [address]    Report Status 11/13/2018 FINAL  Final         Radiology Studies: Dg Chest Port 1 View  Result Date: 11/21/2018 CLINICAL DATA:  Right chest tube evaluation. EXAM: PORTABLE CHEST 1 VIEW COMPARISON:  November 20, 2018 FINDINGS: The right chest tube is stable. Right pleural fluid and associated right basilar opacity are stable. The left PICC line terminates in the central SVC. Stable cardiomegaly. The hila and mediastinum are unchanged. Visualized left lung is unremarkable. The lateral left lung base was not included on today's study. IMPRESSION: 1. Support apparatus as above. 2. Right pleural effusion and associated right basilar opacity are stable. 3. No other interval changes. The lateral left lung base was not included on today's study. Electronically Signed   By: Gerome Sam  III M.D   On: 11/21/2018 07:01        Scheduled Meds: . sodium chloride   Intravenous Once  . amiodarone  200 mg Oral Daily  . apixaban  5 mg Oral BID  . carvedilol  3.125 mg Oral BID WC  . feeding supplement (ENSURE ENLIVE)  237 mL Oral TID BM  . ferrous sulfate  325 mg Oral TID WC  . furosemide  40 mg Oral Daily  . pantoprazole  40 mg Oral BID AC  . polyethylene glycol  17 g Oral BID  . rosuvastatin  20 mg Oral q1800   Continuous Infusions: . ampicillin (OMNIPEN) IV 2 g (11/22/18 0512)  . magnesium sulfate 1 - 4 g bolus IVPB       LOS: 10 days     Alwyn Ren, MD [facility] If 7PM-7AM, please contact night-coverage [email or web address] Password [facility] 11/22/2018, 12:24 PM

## 2018-11-22 NOTE — Plan of Care (Signed)
Care plans reviewed and patient is progressing.  

## 2018-11-23 ENCOUNTER — Inpatient Hospital Stay (HOSPITAL_COMMUNITY): Payer: Medicaid Other

## 2018-11-23 DIAGNOSIS — J90 Pleural effusion, not elsewhere classified: Secondary | ICD-10-CM

## 2018-11-23 NOTE — Progress Notes (Signed)
Chest tube removed.  Unable to tie off sutures d/t positioning.  Sutures removed.  Coved with dry gauze and Tegaderm.   Marcelino Duster

## 2018-11-23 NOTE — Evaluation (Signed)
Occupational Therapy Evaluation Patient Details Name: Aaron [name] MRN: [id number] DOB: 11/09/1965 Today's Date: 11/23/2018    History of Present Illness 53 year old male with no significant past medical history until her recent prolonged hospitalization from 09/30/20/9/20 for new onset ischemic CHF (ICM), three-vessel CAD (not a candidate for CABG per CTS), new onset A. Fib, spontaneous right thigh hematoma and GI bleed.  His hospital course was complicated by a dynamic ileus/SBO requiring NG tube.  He was discharged to CIR, returned to acute with progressive weakness and dyspnea over the last 2 to 3 days. CT chest was obtained and concerning for empyema. Chest tube placement 4/16   Clinical Impression   Pt stood x 2 with use of sara plus and transferred to chair where he performed grooming in sitting. Pt with symptomatic hypotension in chair with BP of 68/57, elevated LEs and lowered head of chair with BP of 84/58, performing ankle pumps with improvement of symptoms. Educated pt in how to gradually elevate the head of the chair and to monitor symptoms to increase tolerance of upright. Pt remained in chair at end of session. NT aware that sara plus used for transfer.    Follow Up Recommendations  CIR;Supervision/Assistance - 24 hour    Equipment Recommendations  Other (comment)(defer to next venue)    Recommendations for Other Services Rehab consult     Precautions / Restrictions Precautions Precautions: Fall Precaution Comments: monitor BP, hypotensive in sitting/standing Required Braces or Orthoses: Other Brace Other Brace: RLE Bledsoe brace locked in extension when ambulating that can be d/c'd when pt can perform 10 SLRs without assist Restrictions RLE Weight Bearing: Weight bearing as tolerated LLE Weight Bearing: Weight bearing as tolerated      Mobility Bed Mobility Overal bed mobility: Needs Assistance Bed Mobility: Supine to Sit     Supine to sit: Min assist      General bed mobility comments: pulled toward EOB with PT's hand, HOB up  Transfers Overall transfer level: Needs assistance Equipment used: Ambulation equipment used Transfers: Sit to/from Stand Sit to Stand: +2 physical assistance;Max assist         General transfer comment: assisted with bed pad under buttocks for stand from bed, pt able to stand with sara plus assist only during second trial, flexed posture and pt reporting SOB with standing    Balance Overall balance assessment: Needs assistance   Sitting balance-Leahy Scale: Fair     Standing balance support: Bilateral upper extremity supported Standing balance-Leahy Scale: Poor Standing balance comment: in sara plus, flexed posture                           ADL either performed or assessed with clinical judgement   ADL Overall ADL's : Needs assistance/impaired     Grooming: Wash/dry hands;Wash/dry face;Oral care;Sitting;Minimal assistance               Lower Body Dressing: Total assistance;Bed level(brace and socks)               Functional mobility during ADLs: Total assistance;+2 for physical assistance(with sara plus)       Vision         Perception     Praxis      Pertinent Vitals/Pain Pain Assessment: No/denies pain     Hand Dominance     Extremity/Trunk Assessment             Communication     Cognition Arousal/Alertness: Awake/alert Behavior  During Therapy: WFL for tasks assessed/performed Overall Cognitive Status: Within Functional Limits for tasks assessed                                     General Comments       Exercises Other Exercises Other Exercises: ankle exercises to bring BP up once seated in chair   Shoulder Instructions      Home Living                                          Prior Functioning/Environment                   OT Problem List:        OT Treatment/Interventions:      OT  Goals(Current goals can be found in the care plan section) Acute Rehab OT Goals Patient Stated Goal: to get stronger OT Goal Formulation: With patient Time For Goal Achievement: 12/01/18 Potential to Achieve Goals: Good  OT Frequency: Min 3X/week   Barriers to D/C:            Co-evaluation PT/OT/SLP Co-Evaluation/Treatment: Yes Reason for Co-Treatment: For patient/therapist safety   OT goals addressed during session: ADL's and self-care      AM-PAC OT "6 Clicks" Daily Activity     Outcome Measure Help from another person eating meals?: None Help from another person taking care of personal grooming?: A Little Help from another person toileting, which includes using toliet, bedpan, or urinal?: Total Help from another person bathing (including washing, rinsing, drying)?: A Lot Help from another person to put on and taking off regular upper body clothing?: A Lot Help from another person to put on and taking off regular lower body clothing?: Total 6 Click Score: 13   End of Session Nurse Communication: Need for lift equipment  Activity Tolerance: Patient tolerated treatment well Patient left: in chair;with call bell/phone within reach  OT Visit Diagnosis: Unsteadiness on feet (R26.81);Other abnormalities of gait and mobility (R26.89);Muscle weakness (generalized) (M62.81);Pain                Time: 1315-1350 OT Time Calculation (min): 35 min Charges:  OT General Charges $OT Visit: 1 Visit OT Treatments $Self Care/Home Management : 8-22 mins  Martie Round, OTR/L Acute Rehabilitation Services Pager: [phone] Office: [phone]  Evern Bio 11/23/2018, 2:11 PM

## 2018-11-23 NOTE — Progress Notes (Signed)
Inpatient Rehab Admissions Coordinator:   Continuing to follow for medical readiness and tolerance.  Note pt continues to have his chest tube and has not been transferred out of bed since readmission to acute.  Will need to tolerate out of bed daily to chair.    Estill Dooms, PT, DPT Admissions Coordinator [phone] 11/23/18  10:37 AM

## 2018-11-23 NOTE — Progress Notes (Addendum)
      [address]       Aaron [name] [id number]             [phone]           Subjective: Patient without specific complaints this am.  Objective: Vital signs in last 24 hours: Temp:  [97.7 F (36.5 [name])-98.4 F (36.9 [name])] 97.7 F (36.5 [name]) (04/27 0505) Pulse Rate:  [61-71] 61 (04/27 0505) Cardiac Rhythm: Bundle branch block;Atrial fibrillation (04/26 2000) Resp:  [19-24] 20 (04/27 0505) BP: (114-116)/(66-78) 114/78 (04/27 0505) SpO2:  [94 %-99 %] 97 % (04/27 0505) Weight:  [120.2 kg] 120.2 kg (04/27 0511)     Intake/Output from previous day: 04/26 0701 - 04/27 0700 In: 1714 [P.O.:984; I.V.:230; IV Piggyback:500] Out: 2016 [Urine:2000; Stool:1; Chest Tube:15]   Physical Exam:  Cardiovascular: IRRR IRRR Pulmonary: Breath sounds slightly diminished on the right Wounds: Dressing is clean and dry.   Chest Tube: to water seal, tidling with cough but no true air leak this am  Lab Results: CBC: Recent Labs    11/21/18 0348 11/22/18 0323  WBC  --  9.0  HGB 7.5* 7.6*  HCT 24.3* 24.9*  PLT  --  241   BMET:  Recent Labs    11/20/18 1032 11/22/18 0323  NA  --  138  K  --  3.8  CL  --  105  CO2  --  26  GLUCOSE  --  93  BUN  --  18  CREATININE  --  0.99  CALCIUM 8.4* 8.2*    PT/INR:  No results for input(s): LABPROT, INR in the last 72 hours. ABG:  INR: Will add last result for INR, ABG once components are confirmed Will add last 4 CBG results once components are confirmed  Assessment/Plan:  1. CV-chronic a fib. HR did brady into the 30's briefly last night. On Carvedilol 3.125 mg bid, Amiodarone 200 mg daily, and Apixaban 5 mg bid. 2. Pulmonary - S/p 14 French pigtail tube 04/16 and thrombolytics yesterday. On 1-2 liter of oxygen via [place] at night only.  Chest tube is to water seal and there is tidling with cough but no true air leak. Chest tube with 15 cc last 24 hours. CXR appears stable. Right pleural fluid culture showed E. Coli.    3.  ID-on Ampicillin for E. Coli 4. Last H and H 7.6 and 24.9.  5. Hypocalcemia-per primary  Aaron M ZimmermanPA-[name] 11/23/2018,7:11 AM [phone]  Minimal output, no air leak- will dc chest tube today  Aaron [name]. Aaron Fetch, MD [facility] [phone]

## 2018-11-23 NOTE — Discharge Instructions (Signed)

## 2018-11-23 NOTE — Progress Notes (Signed)
PROGRESS NOTE    Aaron [name]  JYL:[id number] DOB: Apr 16, 1966 DOA: 11/12/2018 PCP: [facility] [place]  Brief Narrative: 53 year old male with no significant past medical history until her recent prolonged hospitalization from 09/30/20/9/20 for new onset ischemic CHF (ICM), three-vessel CAD (not a candidate for CABG per CTS), new onset A. Fib, spontaneous right thigh hematoma and GI bleed. His hospital course was complicated by a dynamic ileus/SBO requiring NG tube. He was discharged to CIR, and returns with progressive weakness and dyspnea over the last 2 to 3 days.  He had CXR that was concerning for hydro-versus hemothorax for which he was admitted. CT chest was obtained and concerning for empyema. Cardiothoracic surgery consulted by admitting provider and recommended pigtail placement by IR for potential thrombolytics in few days. He was also started on Vanco and Zosyn for possible HAP/parapneumonic effusion.  Patient had chest tube placed later the day of admission.Also has had thrombolytics. Chest tube drained about 4 L of purulent material since then. Culture grew pansensitive E. Coli. Transitioned to ampicillin on 11/15/2018.  Patient stable. Chest tube remains in place. CT surgery and IR following. Transfer to cardiac telemetry. Assessment & Plan:   Active Problems:   Acute systolic CHF (congestive heart failure) (HCC)   Acute hypoxemic respiratory failure (HCC)   Morbid obesity (HCC)   Atrial fibrillation with rapid ventricular response (HCC)   Hypokalemia   Protein-calorie malnutrition, severe   Hematoma of right thigh   Acute blood loss anemia   Orthostatic hypotension   Empyema (HCC)  Right Sided Empyema  Dyspnea/fatigue  - due to pleural effusion/parapneumonic effusion-improving. -Status post chest tube placement by IR on 4/16 and thrombolytics.  -Chest tube with 15cc drainage.  -No fluid cytology or chemistry sent.  -Culture grew  pansensitive E. Coli.Blood cultures NGTD -Vancomycin and Zosyn for HAP-4/16-4/19 -Continued onIV ampicillin 4/19-- -IR and CTSfollowing -Per CTS, continue chest tube , considering clamping of the tube early next week Follow-up chest x-ray stable right empyema 11/23/2018 He is also on Lasix 40 mg daily. -CT surgery planning to DC chest tube today.  Chronic systolic CHF/ICM: Echo with EF of 15%. Appears euvolemic.No cardiopulmonary symptomsat this time -Continue home Lasix-good urine output. -ARB on hold due tosoft blood pressures. -Daily weight, intake output and renal function.  History of three-vessel CAD:not a candidate for CABG per CTSduring previous hospitalization-stable. -Continue home medications (low-dose Coreg and Crestor)as tolerated  History of blood loss anemia/GI bleed: Hb this morning7.6-Continue with PPI as tolerated. Stable for now -feraheme 4/26 Started po iron  Paroxysmal A. fib without RVR -Continue home Eliquis, amiodarone,  and Coreg - Per cards, amiodarone reduced to 200 mg daily. Please f/u with cardiology when nearing discharge to ensure follow up.  Bradycardia: - Pt currently on carvedilol amiodarone heart rate intermittently dipping into the 30s overnight.  Will decrease the dose of Coreg.  Digoxin has been stopped.  Continue amiodarone and monitor.  No further bradycardia overnight.  Elevated liver enzymes:Resolved  Protein calorie malnutrition: BMI 36. Due to chronic illness. -Appreciate dietitian input-continue supplements.  DVT prophylaxis:eliquis Code Status:full  Family Communication:Discussed with patient he reported that I do not have to call his wife and that he FaceTime his family every day. Disposition Plan:pending provement obtain PT consult   Consultants:  CT surgery  IR  Cardiology  Procedures: Chest tube 4/16  Antibiotics ampicillin Pressure Injury 11/03/18 Stage II -  Partial thickness loss  of dermis presenting as a shallow open ulcer with a  red, pink wound bed without slough. (Active)  11/03/18 1500  Location: Buttocks  Location Orientation: Right  Staging: Stage II -  Partial thickness loss of dermis presenting as a shallow open ulcer with a red, pink wound bed without slough.  Wound Description (Comments):   Present on Admission: No     Pressure Injury 11/05/18 Stage II -  Partial thickness loss of dermis presenting as a shallow open ulcer with a red, pink wound bed without slough. Scabbed pressure injury to right nare from NG tube (Active)  11/05/18 1758  Location: Nare  Location Orientation: Right;Medial  Staging: Stage II -  Partial thickness loss of dermis presenting as a shallow open ulcer with a red, pink wound bed without slough.  Wound Description (Comments): Scabbed pressure injury to right nare from NG tube  Present on Admission: Yes      Nutrition Problem: Increased nutrient needs Etiology: acute illness     Signs/Symptoms: estimated needs    Interventions: Ensure Enlive (each supplement provides 350kcal and 20 grams of protein)  Estimated body mass index is 32.26 kg/m as calculated from the following:   Height as of this encounter:  (1.93 m).   Weight as of this encounter: 120.2 kg.   Subjective:  Resting in bed awake alert denies any specific complaints feels breathing is at baseline no shortness of breath nausea vomiting diarrhea Objective: Vitals:   11/22/18 1956 11/23/18 0505 11/23/18 0511 11/23/18 0937  BP: 116/66 114/78  104/76  Pulse: 71 61  73  Resp: 19 20    Temp: 98.4 F (36.9 C) 97.7 F (36.5 C)    TempSrc: Oral Oral    SpO2: 94% 97%    Weight:   120.2 kg   Height:        Intake/Output Summary (Last 24 hours) at 11/23/2018 0941 Last data filed at 11/23/2018 0700 Gross per 24 hour  Intake 1287 ml  Output 1566 ml  Net -279 ml   Filed Weights   11/14/18 1802 11/22/18 0700 11/23/18 0511  Weight: 131.8 kg 122.5 kg  120.2 kg    Examination:  General exam: Appears calm and comfortable  Respiratory system: Diminished breath  sounds to auscultation. Respiratory effort normal. Cardiovascular system: S1 & S2 heard, RRR. No JVD, murmurs, rubs, gallops or clicks. No pedal edema. Gastrointestinal system: Abdomen is nondistended, soft and nontender. No organomegaly or masses felt. Normal bowel sounds heard. Central nervous system: Alert and oriented. No focal neurological deficits. Extremities: Symmetric 5 x 5 power.  No edema Skin: No rashes, lesions or ulcers Psychiatry: Judgement and insight appear normal. Mood & affect appropriate.     Data Reviewed: I have personally reviewed following labs and imaging studies  CBC: Recent Labs  Lab 11/19/18 0954 11/20/18 0507 11/20/18 1032 11/21/18 0348 11/22/18 0323  WBC 10.7* 8.6  --   --  9.0  NEUTROABS  --   --   --   --  6.7  HGB 7.5* 6.4* 7.7* 7.5* 7.6*  HCT 24.8* 21.2* 25.4* 24.3* 24.9*  MCV 95.0 94.6  --   --  95.4  PLT 276 212  --   --  241   Basic Metabolic Panel: Recent Labs  Lab 11/19/18 0954 11/20/18 0507 11/20/18 1032 11/22/18 0323  NA 138 146*  --  138  K 3.8 3.6  --  3.8  CL 104 113*  --  105  CO2 26 23  --  26  GLUCOSE 122* 75  --  93  BUN 18 14  --  18  CREATININE 0.98 0.75  --  0.99  CALCIUM 8.2* 5.7* 8.4* 8.2*  MG  --  1.5*  --   --    GFR: Estimated Creatinine Clearance: 120.9 mL/min (by C-G formula based on SCr of 0.99 mg/dL). Liver Function Tests: Recent Labs  Lab 11/20/18 0507 11/20/18 1032  AST 22  --   ALT 30  --   ALKPHOS 65  --   BILITOT 0.8  --   PROT 4.3*  --   ALBUMIN 1.4* 1.8*   No results for input(s): LIPASE, AMYLASE in the last 168 hours. No results for input(s): AMMONIA in the last 168 hours. Coagulation Profile: No results for input(s): INR, PROTIME in the last 168 hours. Cardiac Enzymes: No results for input(s): CKTOTAL, CKMB, CKMBINDEX, TROPONINI in the last 168 hours. BNP (last 3 results)  No results for input(s): PROBNP in the last 8760 hours. HbA1C: No results for input(s): HGBA1C in the last 72 hours. CBG: No results for input(s): GLUCAP in the last 168 hours. Lipid Profile: No results for input(s): CHOL, HDL, LDLCALC, TRIG, CHOLHDL, LDLDIRECT in the last 72 hours. Thyroid Function Tests: No results for input(s): TSH, T4TOTAL, FREET4, T3FREE, THYROIDAB in the last 72 hours. Anemia Panel: No results for input(s): VITAMINB12, FOLATE, FERRITIN, TIBC, IRON, RETICCTPCT in the last 72 hours. Sepsis Labs: No results for input(s): PROCALCITON, LATICACIDVEN in the last 168 hours.  No results found for this or any previous visit (from the past 240 hour(s)).       Radiology Studies: Dg Chest Port 1 View  Result Date: 11/23/2018 CLINICAL DATA:  Empyema EXAM: PORTABLE CHEST 1 VIEW COMPARISON:  11/21/2018 FINDINGS: Stable left arm PICC and right chest tube. No pneumothorax. Loculated right pleural fluid is stable. Heterogeneous opacities at the right lung base are stable. Cardiac silhouette remains prominent likely due to AP technique. IMPRESSION: Stable right empyema and chest tube without pneumothorax. Stable right basilar pulmonary opacities. Electronically Signed   By: Jolaine Click M.D.   On: 11/23/2018 08:03        Scheduled Meds: . sodium chloride   Intravenous Once  . amiodarone  200 mg Oral Daily  . apixaban  5 mg Oral BID  . carvedilol  3.125 mg Oral Daily  . feeding supplement (ENSURE ENLIVE)  237 mL Oral TID BM  . ferrous sulfate  325 mg Oral TID WC  . furosemide  40 mg Oral Daily  . pantoprazole  40 mg Oral BID AC  . polyethylene glycol  17 g Oral BID  . rosuvastatin  20 mg Oral q1800   Continuous Infusions: . ampicillin (OMNIPEN) IV 2 g (11/23/18 0511)  . magnesium sulfate 1 - 4 g bolus IVPB       LOS: 11 days     Alwyn Ren, MD [facility]  If 7PM-7AM, please contact night-coverage [email or web address] Password [facility] 11/23/2018, 9:41  AM

## 2018-11-23 NOTE — Plan of Care (Signed)
  Problem: Education: Goal: Knowledge of General Education information will improve Description: Including pain rating scale, medication(s)/side effects and non-pharmacologic comfort measures Outcome: Progressing   Problem: Health Behavior/Discharge Planning: Goal: Ability to manage health-related needs will improve Outcome: Progressing   Problem: Clinical Measurements: Goal: Respiratory complications will improve Outcome: Progressing   

## 2018-11-23 NOTE — Progress Notes (Signed)
Physical Therapy Treatment Patient Details Name: Aaron [name] MRN: [id number] DOB: [birth year] Today's Date: 11/23/2018    History of Present Illness 53 year old male with no significant past medical history until her recent prolonged hospitalization from 09/30/20/9/20 for new onset ischemic CHF (ICM), three-vessel CAD (not a candidate for CABG per CTS), new onset A. Fib, spontaneous right thigh hematoma and GI bleed.  His hospital course was complicated by a dynamic ileus/SBO requiring NG tube.  He was discharged to CIR, returned to acute with progressive weakness and dyspnea over the last 2 to 3 days. CT chest was obtained and concerning for empyema. Chest tube placement 4/16    PT Comments    Pt admitted with above diagnosis. Pt currently with functional limitations due to balance and endurance deficits. Placed the Bledsoe brace on pt's right LE.  Unlocked hinge for standing. Pt was able to stand with [place] Dec Plus and transfer to the chair with the footplate in place.  Stood for 30 seconds and then had to sit due to fatigue and DOE3/4. Rested in chair and then stood again for 10 seconds but again was dyspneic and fatigued quickly.  Pt left in chair and was so happy about being able to stand again and hold his weight.  Great progression today!  Pt will benefit from skilled PT to increase their independence and safety with mobility to allow discharge to the venue listed below.    Follow Up Recommendations  CIR;Supervision/Assistance - 24 hour     Equipment Recommendations  Other (comment)(TBD)    Recommendations for Other Services       Precautions / Restrictions Precautions Precautions: Fall Precaution Comments: monitor BP, hypotensive in sitting/standing Required Braces or Orthoses: Other Brace Other Brace: RLE Bledsoe brace locked in extension when ambulating that can be d/c'd when pt can perform 10 SLRs without assist Restrictions Weight Bearing Restrictions: No RLE Weight Bearing:  Weight bearing as tolerated LLE Weight Bearing: Weight bearing as tolerated    Mobility  Bed Mobility Overal bed mobility: Needs Assistance Bed Mobility: Supine to Sit     Supine to sit: Min assist     General bed mobility comments: pulled toward EOB with PT's hand, HOB up  Transfers Overall transfer level: Needs assistance Equipment used: Ambulation equipment used Transfers: Sit to/from Stand Sit to Stand: +2 physical assistance;Max assist         General transfer comment: assisted with bed pad under buttocks for stand from bed, pt able to stand with sara plus assist only during second trial, flexed posture and pt reporting SOB with standing  Ambulation/Gait                 Stairs             Wheelchair Mobility    Modified Rankin (Stroke Patients Only)       Balance Overall balance assessment: Needs assistance Sitting-balance support: Bilateral upper extremity supported Sitting balance-Leahy Scale: Fair Sitting balance - Comments: sat EOB with min guard assist    Standing balance support: Bilateral upper extremity supported Standing balance-Leahy Scale: Poor Standing balance comment: in sara plus, flexed posture.  Stood x 30 seconds first time and 10 seconds the second time.                              Cognition Arousal/Alertness: Awake/alert Behavior During Therapy: WFL for tasks assessed/performed Overall Cognitive Status: Within Functional Limits for tasks assessed  Exercises General Exercises - Lower Extremity Ankle Circles/Pumps: AROM;Both;Supine;20 reps Quad Sets: AROM;Both;10 reps;Supine Heel Slides: AROM;5 reps;Right;Supine Other Exercises Other Exercises: ankle exercises to bring BP up once seated in chair    General Comments General comments (skin integrity, edema, etc.): VSS      Pertinent Vitals/Pain Pain Assessment: No/denies pain    Home Living                       Prior Function            PT Goals (current goals can now be found in the care plan section) Acute Rehab PT Goals Patient Stated Goal: to get stronger Progress towards PT goals: Progressing toward goals    Frequency    Min 3X/week      PT Plan Current plan remains appropriate    Co-evaluation PT/OT/SLP Co-Evaluation/Treatment: Yes Reason for Co-Treatment: Complexity of the patient's impairments (multi-system involvement);For patient/therapist safety PT goals addressed during session: Mobility/safety with mobility OT goals addressed during session: ADL's and self-care      AM-PAC PT "6 Clicks" Mobility   Outcome Measure  Help needed turning from your back to your side while in a flat bed without using bedrails?: A Little Help needed moving from lying on your back to sitting on the side of a flat bed without using bedrails?: A Lot Help needed moving to and from a bed to a chair (including a wheelchair)?: A Lot Help needed standing up from a chair using your arms (e.g., wheelchair or bedside chair)?: A Lot Help needed to walk in hospital room?: Total Help needed climbing 3-5 steps with a railing? : Total 6 Click Score: 11    End of Session   Activity Tolerance: Patient tolerated treatment well Patient left: with call bell/phone within reach;in chair;with chair alarm set Nurse Communication: Mobility status PT Visit Diagnosis: Muscle weakness (generalized) (M62.81);Difficulty in walking, not elsewhere classified (R26.2);Pain Pain - part of body: (chest tube site)     Time: 8309-4076 PT Time Calculation (min) (ACUTE ONLY): 28 min  Charges:  $Therapeutic Activity: 8-22 mins                     Zeina Akkerman,PT Acute Rehabilitation Services Pager:  [phone]  Office:  [phone]     Berline Lopes 11/23/2018, 3:08 PM

## 2018-11-24 ENCOUNTER — Inpatient Hospital Stay (HOSPITAL_COMMUNITY): Payer: Medicaid Other

## 2018-11-24 DIAGNOSIS — J90 Pleural effusion, not elsewhere classified: Secondary | ICD-10-CM

## 2018-11-24 LAB — CBC WITH DIFFERENTIAL/PLATELET
Abs Immature Granulocytes: 0.06 10*3/uL (ref 0.00–0.07)
Basophils Absolute: 0 10*3/uL (ref 0.0–0.1)
Basophils Relative: 1 %
Eosinophils Absolute: 0.1 10*3/uL (ref 0.0–0.5)
Eosinophils Relative: 1 %
HCT: 24.1 % — ABNORMAL LOW (ref 39.0–52.0)
Hemoglobin: 7.6 g/dL — ABNORMAL LOW (ref 13.0–17.0)
Immature Granulocytes: 1 %
Lymphocytes Relative: 12 %
Lymphs Abs: 1.1 10*3/uL (ref 0.7–4.0)
MCH: 29.9 pg (ref 26.0–34.0)
MCHC: 31.5 g/dL (ref 30.0–36.0)
MCV: 94.9 fL (ref 80.0–100.0)
Monocytes Absolute: 0.7 10*3/uL (ref 0.1–1.0)
Monocytes Relative: 8 %
Neutro Abs: 6.8 10*3/uL (ref 1.7–7.7)
Neutrophils Relative %: 77 %
Platelets: 225 10*3/uL (ref 150–400)
RBC: 2.54 MIL/uL — ABNORMAL LOW (ref 4.22–5.81)
RDW: 21.8 % — ABNORMAL HIGH (ref 11.5–15.5)
WBC: 8.8 10*3/uL (ref 4.0–10.5)
nRBC: 0 % (ref 0.0–0.2)

## 2018-11-24 LAB — COMPREHENSIVE METABOLIC PANEL
ALT: 25 U/L (ref 0–44)
AST: 20 U/L (ref 15–41)
Albumin: 2 g/dL — ABNORMAL LOW (ref 3.5–5.0)
Alkaline Phosphatase: 89 U/L (ref 38–126)
Anion gap: 11 (ref 5–15)
BUN: 17 mg/dL (ref 6–20)
CO2: 26 mmol/L (ref 22–32)
Calcium: 8.5 mg/dL — ABNORMAL LOW (ref 8.9–10.3)
Chloride: 101 mmol/L (ref 98–111)
Creatinine, Ser: 0.95 mg/dL (ref 0.61–1.24)
GFR calc Af Amer: >60 mL/min (ref >60)
GFR calc non Af Amer: >60 mL/min (ref >60)
Glucose, Bld: 92 mg/dL (ref 70–99)
Potassium: 3.6 mmol/L (ref 3.5–5.1)
Sodium: 138 mmol/L (ref 135–145)
Total Bilirubin: 0.9 mg/dL (ref 0.3–1.2)
Total Protein: 6 g/dL — ABNORMAL LOW (ref 6.5–8.1)

## 2018-11-24 LAB — TYPE AND SCREEN
ABO/RH(D): A POS
Antibody Screen: NEGATIVE
Unit division: 0

## 2018-11-24 LAB — BPAM RBC
Blood Product Expiration Date: 202005022359
Unit Type and Rh: 6200

## 2018-11-24 LAB — MAGNESIUM: Magnesium: 1.8 mg/dL (ref 1.7–2.4)

## 2018-11-24 MED ORDER — MAGNESIUM SULFATE 4 GM/100ML IV SOLN
4.0000 g | Freq: Once | INTRAVENOUS | Status: AC
  Administered 2018-11-24: 15:00:00 4 g via INTRAVENOUS
  Filled 2018-11-24: qty 100

## 2018-11-24 NOTE — Progress Notes (Addendum)
PROGRESS NOTE    Aaron [name]  WJX:[id number] DOB: April 07, 1966 DOA: 11/12/2018 PCP: [facility] [place]  Brief Narrative:53 year old male with no significant past medical history until her recent prolonged hospitalization from 09/30/20/9/20 for new onset ischemic CHF (ICM), three-vessel CAD (not a candidate for CABG per CTS), new onset A. Fib, spontaneous right thigh hematoma and GI bleed. His hospital course was complicated by a dynamic ileus/SBO requiring NG tube. He was discharged to CIR, and returns with progressive weakness and dyspnea over the last 2 to 3 days.  He had CXR that was concerning for hydro-versus hemothorax for which he was admitted. CT chest was obtained and concerning for empyema. Cardiothoracic surgery consulted by admitting provider and recommended pigtail placement by IR for potential thrombolytics in few days. He was also started on Vanco and Zosyn for possible HAP/parapneumonic effusion.  Patient had chest tube placed later the day of admission.Also has had thrombolytics. Chest tube drained about 4 L of purulent material since then. Culture grew pansensitive E. Coli. Transitioned to ampicillin on 11/15/2018.  Patient stable. Chest tube remains in place. CT surgery and IR following. Transfer to cardiac telemetry.   Assessment & Plan:   Active Problems:   Acute systolic CHF (congestive heart failure) (HCC)   Acute hypoxemic respiratory failure (HCC)   Morbid obesity (HCC)   Atrial fibrillation with rapid ventricular response (HCC)   Hypokalemia   Protein-calorie malnutrition, severe   Hematoma of right thigh   Acute blood loss anemia   Orthostatic hypotension   Empyema of pleural space (HCC)   Right Sided Empyema  Dyspnea/fatigue  - due to pleural effusion/parapneumonic effusion-improving. -Status post chest tube placement by IR on 4/16 and thrombolytics.  -Chest tube with 15cc drainage.  -No fluid cytology or chemistry  sent.  -Culture grew pansensitive E. Coli.Blood cultures NGTD -Vancomycin and Zosyn for HAP-4/16-4/19 -Continued onIV ampicillin 4/19-- -IR and CTSfollowing -Chest tube removed 11/23/2018.  Chronic systolic CHF/ICM: Echo with EF of 15%. Appears euvolemic.No cardiopulmonary symptomsat this time -Continue home Lasix-good urine output. -ARB on hold due tosoft blood pressures. -Daily weight, intake output and renal function.  History of three-vessel CAD:not a candidate for CABG per CTSduring previous hospitalization-stable. -Continue home medications (low-dose Coreg and Crestor)as tolerated  History of blood loss anemia/GI bleed: Hb this morning7.6-Continue with PPI as tolerated. Stable for now -feraheme4/26 Startedpo iron  Paroxysmal A. fib without RVR -Continue home Eliquis, amiodarone,  and Coreg - Per cards, amiodarone reduced to 200 mg daily. Please f/u with cardiology when nearing discharge to ensure follow up.  Bradycardia: - Pt currently on carvedilolamiodarone heart rate intermittently dipping into the 30s overnight. Will decrease the dose of Coreg. Digoxin has been stopped. Continue amiodarone and monitor. No further bradycardia overnight.  Elevated liver enzymes:Resolved  Protein calorie malnutrition: BMI 36. Due to chronic illness. -Appreciate dietitian input-continue supplements.  DVT prophylaxis:eliquis Code Status:full  Family Communication:Discussed with patient he reported that I do not have to call his wife and that he FaceTime his family every day. Disposition Plan:pending  CIR   Consultants:  CT surgery  IR  Cardiology Procedures: Chest tube 4/16 Chest tube removed 11/23/2018  Pressure Injury 11/03/18 Stage II -  Partial thickness loss of dermis presenting as a shallow open ulcer with a red, pink wound bed without slough. (Active)  11/03/18 1500  Location: Buttocks  Location Orientation: Right  Staging:  Stage II -  Partial thickness loss of dermis presenting as a shallow open ulcer with a  red, pink wound bed without slough.  Wound Description (Comments):   Present on Admission: No     Pressure Injury 11/05/18 Stage II -  Partial thickness loss of dermis presenting as a shallow open ulcer with a red, pink wound bed without slough. Scabbed pressure injury to right nare from NG tube (Active)  11/05/18 1758  Location: Nare  Location Orientation: Right;Medial  Staging: Stage II -  Partial thickness loss of dermis presenting as a shallow open ulcer with a red, pink wound bed without slough.  Wound Description (Comments): Scabbed pressure injury to right nare from NG tube  Present on Admission: Yes      Nutrition Problem: Increased nutrient needs Etiology: acute illness     Signs/Symptoms: estimated needs    Interventions: Ensure Enlive (each supplement provides 350kcal and 20 grams of protein)  Estimated body mass index is 32.14 kg/m as calculated from the following:   Height as of this encounter: 6\' 4"  (1.93 m).   Weight as of this encounter: 119.7 kg.   Subjective: Patient resting in bed anxious to go home denies shortness of breath did not have to use any oxygen last night  Objective: Vitals:   11/23/18 1341 11/23/18 2127 11/24/18 0634 11/24/18 0908  BP: (!) 77/62 106/72 112/71   Pulse: 88 70 93 75  Resp: 20 (!) 21 19   Temp: 97.6 F (36.4 C) 97.7 F (36.5 C) (!) 97.5 F (36.4 C)   TempSrc: Oral Oral Oral   SpO2: 95% 94% 97%   Weight:   119.7 kg   Height:        Intake/Output Summary (Last 24 hours) at 11/24/2018 1230 Last data filed at 11/24/2018 0908 Gross per 24 hour  Intake 760 ml  Output 1600 ml  Net -840 ml   Filed Weights   11/22/18 0700 11/23/18 0511 11/24/18 0634  Weight: 122.5 kg 120.2 kg 119.7 kg    Examination:  General exam: Appears calm and comfortable  Respiratory system: Diminished breath sounds at the bases to auscultation. Respiratory  effort normal. Cardiovascular system: S1 & S2 heard, RRR. No JVD, murmurs, rubs, gallops or clicks. No pedal edema. Gastrointestinal system: Abdomen is nondistended, soft and nontender. No organomegaly or masses felt. Normal bowel sounds heard. Central nervous system: Alert and oriented. No focal neurological deficits. Extremities: Unna boots in place in both lower extremities Skin: No rashes, lesions or ulcers Psychiatry: Judgement and insight appear normal. Mood & affect appropriate.     Data Reviewed: I have personally reviewed following labs and imaging studies  CBC: Recent Labs  Lab 11/19/18 0954 11/20/18 0507 11/20/18 1032 11/21/18 0348 11/22/18 0323  WBC 10.7* 8.6  --   --  9.0  NEUTROABS  --   --   --   --  6.7  HGB 7.5* 6.4* 7.7* 7.5* 7.6*  HCT 24.8* 21.2* 25.4* 24.3* 24.9*  MCV 95.0 94.6  --   --  95.4  PLT 276 212  --   --  241   Basic Metabolic Panel: Recent Labs  Lab 11/19/18 0954 11/20/18 0507 11/20/18 1032 11/22/18 0323 11/24/18 0459  NA 138 146*  --  138  --   K 3.8 3.6  --  3.8  --   CL 104 113*  --  105  --   CO2 26 23  --  26  --   GLUCOSE 122* 75  --  93  --   BUN 18 14  --  18  --  CREATININE 0.98 0.75  --  0.99  --   CALCIUM 8.2* 5.7* 8.4* 8.2*  --   MG  --  1.5*  --   --  1.8   GFR: Estimated Creatinine Clearance: 120.7 mL/min (by C-G formula based on SCr of 0.99 mg/dL). Liver Function Tests: Recent Labs  Lab 11/20/18 0507 11/20/18 1032  AST 22  --   ALT 30  --   ALKPHOS 65  --   BILITOT 0.8  --   PROT 4.3*  --   ALBUMIN 1.4* 1.8*   No results for input(s): LIPASE, AMYLASE in the last 168 hours. No results for input(s): AMMONIA in the last 168 hours. Coagulation Profile: No results for input(s): INR, PROTIME in the last 168 hours. Cardiac Enzymes: No results for input(s): CKTOTAL, CKMB, CKMBINDEX, TROPONINI in the last 168 hours. BNP (last 3 results) No results for input(s): PROBNP in the last 8760 hours. HbA1C: No results  for input(s): HGBA1C in the last 72 hours. CBG: No results for input(s): GLUCAP in the last 168 hours. Lipid Profile: No results for input(s): CHOL, HDL, LDLCALC, TRIG, CHOLHDL, LDLDIRECT in the last 72 hours. Thyroid Function Tests: No results for input(s): TSH, T4TOTAL, FREET4, T3FREE, THYROIDAB in the last 72 hours. Anemia Panel: No results for input(s): VITAMINB12, FOLATE, FERRITIN, TIBC, IRON, RETICCTPCT in the last 72 hours. Sepsis Labs: No results for input(s): PROCALCITON, LATICACIDVEN in the last 168 hours.  No results found for this or any previous visit (from the past 240 hour(s)).       Radiology Studies: Dg Chest 1v Repeat Same Day  Result Date: 11/23/2018 CLINICAL DATA:  History of right empyema drainage. Status post chest tube removal EXAM: CHEST - 1 VIEW SAME DAY COMPARISON:  11/23/2018 FINDINGS: Small stable right residual empyema and basilar airspace disease. No pneumothorax. Left lung is clear. Stable cardiomegaly. No acute osseous abnormality. Left-sided PICC line with the tip projecting over the SVC. IMPRESSION: 1. Small stable right residual empyema and basilar airspace disease. No pneumothorax. Electronically Signed   By: Elige Ko   On: 11/23/2018 11:54   Dg Chest Port 1 View  Result Date: 11/24/2018 CLINICAL DATA:  Pleural effusion.  Chest tube removed yesterday. EXAM: PORTABLE CHEST 1 VIEW COMPARISON:  November 23, 2018 FINDINGS: Persistent right effusion with underlying opacity, stable in the interval. Minimal opacity in the right mid lung is stable as well. No pneumothorax. The left PICC line is in good position terminating in the central SVC. The left lung remains clear. Stable cardiomegaly. The hila and mediastinum are unremarkable. IMPRESSION: 1. The right-sided pleural effusion with underlying opacity and the mild opacity in the right mid lung are stable. 2. The left PICC line is in good position. 3. No changes. Electronically Signed   By: Gerome Sam III  M.D   On: 11/24/2018 08:43   Dg Chest Port 1 View  Result Date: 11/23/2018 CLINICAL DATA:  Empyema EXAM: PORTABLE CHEST 1 VIEW COMPARISON:  11/21/2018 FINDINGS: Stable left arm PICC and right chest tube. No pneumothorax. Loculated right pleural fluid is stable. Heterogeneous opacities at the right lung base are stable. Cardiac silhouette remains prominent likely due to AP technique. IMPRESSION: Stable right empyema and chest tube without pneumothorax. Stable right basilar pulmonary opacities. Electronically Signed   By: Jolaine Click M.D.   On: 11/23/2018 08:03        Scheduled Meds: . sodium chloride   Intravenous Once  . amiodarone  200 mg Oral Daily  .  apixaban  5 mg Oral BID  . carvedilol  3.125 mg Oral Daily  . feeding supplement (ENSURE ENLIVE)  237 mL Oral TID BM  . ferrous sulfate  325 mg Oral TID WC  . furosemide  40 mg Oral Daily  . pantoprazole  40 mg Oral BID AC  . polyethylene glycol  17 g Oral BID  . rosuvastatin  20 mg Oral q1800   Continuous Infusions: . ampicillin (OMNIPEN) IV 2 g (11/24/18 0913)     LOS: 12 days     Alwyn RenElizabeth G Mathews, MD [facility]  If 7PM-7AM, please contact night-coverage [email or web address] Password [email or web address] 11/24/2018, 12:30 PM

## 2018-11-24 NOTE — Progress Notes (Signed)
Physical Therapy Treatment Patient Details Name: Aaron [name] MRN: [id number] DOB: [birth year] Today's Date: 11/24/2018    History of Present Illness 53 year old male with no significant past medical history until her recent prolonged hospitalization from 09/30/20/9/20 for new onset ischemic CHF (ICM), three-vessel CAD (not a candidate for CABG per CTS), new onset A. Fib, spontaneous right thigh hematoma and GI bleed.  His hospital course was complicated by a dynamic ileus/SBO requiring NG tube.  He was discharged to CIR, returned to acute with progressive weakness and dyspnea over the last 2 to 3 days. CT chest was obtained and concerning for empyema. Chest tube placement 4/16    PT Comments    Pt admitted with above diagnosis. Pt currently with functional limitations due to balance and endurance deficits. Pt was able to stand with Huntley Dec plus for 1 min and then 20 seconds the 2nd attempt.  Pt did not need any assist to stand the first time with Huntley Dec plus and pt doing all the work.  The 2nd attempt used bed pad to assist with power up.  Pt continues to progress and works very hard to complete tasks asked of him.   Pt will benefit from skilled PT to increase their independence and safety with mobility to allow discharge to the venue listed below.     Follow Up Recommendations  CIR;Supervision/Assistance - 24 hour     Equipment Recommendations  Other (comment)(TBD)    Recommendations for Other Services       Precautions / Restrictions Precautions Precautions: Fall Precaution Comments: monitor BP, hypotensive in sitting/standing Required Braces or Orthoses: Other Brace Other Brace: RLE Bledsoe brace locked in extension when ambulating that can be d/c'd when pt can perform 10 SLRs without assist Restrictions Weight Bearing Restrictions: No RLE Weight Bearing: Weight bearing as tolerated LLE Weight Bearing: Weight bearing as tolerated    Mobility  Bed Mobility Overal bed mobility: Needs  Assistance Bed Mobility: Supine to Sit Rolling: Min assist   Supine to sit: Min assist     General bed mobility comments: pulled toward EOB with PT's hand, HOB flat  Transfers Overall transfer level: Needs assistance Equipment used: Ambulation equipment used Transfers: Sit to/from Stand Sit to Stand: +2 physical assistance;Max assist         General transfer comment: Did not assist with bed pad under buttocks for stand from bed,  but did have to use bed pad to asssit from chair.  pt able to stand with Huntley Dec plus 1 min first trial and 20 seconds 2nd trial. Pt with flexed posture and pt reporting SOB with standing but pushing through.   Ambulation/Gait                 Stairs             Wheelchair Mobility    Modified Rankin (Stroke Patients Only)       Balance Overall balance assessment: Needs assistance Sitting-balance support: Bilateral upper extremity supported Sitting balance-Leahy Scale: Fair Sitting balance - Comments: sat EOB with min guard assist    Standing balance support: Bilateral upper extremity supported Standing balance-Leahy Scale: Poor Standing balance comment: in sara plus, flexed posture.  Stood x 1 min first time and 20 seconds the second time.                              Cognition Arousal/Alertness: Awake/alert Behavior During Therapy: WFL for tasks assessed/performed Overall Cognitive Status:  Within Functional Limits for tasks assessed                                        Exercises General Exercises - Lower Extremity Ankle Circles/Pumps: AROM;Both;Supine;20 reps Long Arc Quad: Seated;AROM;10 reps;Left Hip Flexion/Marching: Both;AAROM;Supine;10 reps    General Comments General comments (skin integrity, edema, etc.): Hr up to 147 bpm with activity.        Pertinent Vitals/Pain Pain Assessment: Faces Faces Pain Scale: Hurts little more Pain Location: ribs Pain Descriptors / Indicators:  Aching Pain Intervention(s): Monitored during session;Limited activity within patient's tolerance;Repositioned    Home Living                      Prior Function            PT Goals (current goals can now be found in the care plan section) Acute Rehab PT Goals Patient Stated Goal: to get stronger Progress towards PT goals: Progressing toward goals    Frequency    Min 3X/week      PT Plan Current plan remains appropriate    Co-evaluation              AM-PAC PT "6 Clicks" Mobility   Outcome Measure  Help needed turning from your back to your side while in a flat bed without using bedrails?: A Little Help needed moving from lying on your back to sitting on the side of a flat bed without using bedrails?: A Lot Help needed moving to and from a bed to a chair (including a wheelchair)?: A Lot Help needed standing up from a chair using your arms (e.g., wheelchair or bedside chair)?: A Lot Help needed to walk in hospital room?: Total Help needed climbing 3-5 steps with a railing? : Total 6 Click Score: 11    End of Session Equipment Utilized During Treatment: Gait belt Activity Tolerance: Patient tolerated treatment well Patient left: with call bell/phone within reach;in chair;with chair alarm set Nurse Communication: Mobility status PT Visit Diagnosis: Muscle weakness (generalized) (M62.81);Difficulty in walking, not elsewhere classified (R26.2);Pain Pain - part of body: (chest tube site)     Time: 2330-0762 PT Time Calculation (min) (ACUTE ONLY): 29 min  Charges:  $Therapeutic Exercise: 8-22 mins $Therapeutic Activity: 8-22 mins                     Bettyann Birchler,PT Acute Rehabilitation Services Pager:  [phone]  Office:  [phone]     Berline Lopes 11/24/2018, 9:48 AM

## 2018-11-24 NOTE — Progress Notes (Addendum)
      [address]       [place] [address]             [phone]       Subjective:  No new complaints.  Doing pretty well.   Objective: Vital signs in last 24 hours: Temp:  [97.5 F (36.4 C)-97.7 F (36.5 C)] 97.5 F (36.4 C) (04/28 0634) Pulse Rate:  [70-93] 93 (04/28 0634) Cardiac Rhythm: Bundle branch block;Atrial fibrillation (04/27 2100) Resp:  [19-21] 19 (04/28 0634) BP: (77-112)/(62-76) 112/71 (04/28 0634) SpO2:  [94 %-97 %] 97 % (04/28 0634) Weight:  [119.7 kg] 119.7 kg (04/28 0634)  Intake/Output from previous day: 04/27 0701 - 04/28 0700 In: 640 [P.O.:240; IV Piggyback:400] Out: 1200 [Urine:1200]  General appearance: alert, cooperative and no distress Heart: irregularly irregular rhythm Lungs: mildly diminished right base Abdomen: soft, non-tender; bowel sounds normal; no masses,  no organomegaly Wound: clean and dry  Lab Results: Recent Labs    11/22/18 0323  WBC 9.0  HGB 7.6*  HCT 24.9*  PLT 241   BMET:  Recent Labs    11/22/18 0323  NA 138  K 3.8  CL 105  CO2 26  GLUCOSE 93  BUN 18  CREATININE 0.99  CALCIUM 8.2*    PT/INR: No results for input(s): LABPROT, INR in the last 72 hours. ABG    Component Value Date/Time   PHART 7.461 (H) 10/09/2018 1047   HCO3 31.3 (H) 10/09/2018 1051   TCO2 33 (H) 10/09/2018 1051   O2SAT 76.7 10/26/2018 0410   CBG (last 3)  No results for input(s): GLUCAP in the last 72 hours.  Assessment/Plan:  1. CV- chronic A. Fib, on Coreg, Amiodarone, Apixaban 2. Pulm- chest tube removed yesterday, CXR without pneumothorax, stable appearance of residual pleural effusion 3. ID- E.Coli from fluid, on Ampicillin 4. DIspo- patient stable, CXR remains stable, post chest tube removal, continue ABX per primary, will sign off please reconsult if further assistance needed   LOS: 12 days    Aaron [name] 11/24/2018 Patient seen and examined, agree with above [facility] after CT removed yesterday Needs  to complete course of ampicillin, given + cultures for E.Coli in pleural fluid, should probably get 4 weeks of antibiotics  Viviann Spare C. Dorris Fetch, MD [facility] [phone]

## 2018-11-24 NOTE — H&P (Signed)
Physical Medicine and Rehabilitation Admission H&P    CC: Debility  HPI: Aaron [name] is a 53 year old male without significant medical history who was originally admitted on 10/01/2018 with 3-4 weeks history of progressive edema history taken from chart review and patient.  Patient had edema in bilateral lower extremities along with shortness of breath and difficulty with ambulation.  He was found to have fluid overload with anasarca, ascites, AKI, and abnormal LFTs. 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  He was not felt to be a good surgical candidate. Hospital course complicated by A fib with RVR but patient developed spontaneous right thigh hematoma 3/18 as well as GIB due to esophageal ulcers with gastritis and esophagitis on 3/22. He has also had issue with abdominal pain due to ileus Tx with  NGT, A fib with NSVT requiring amiodarone for rate control,  hyptension reated with NGT, acute systolic CHF as well as issues with hypotension requiring tilt table for standing. (See prior H&P for full details)  He was admitted to [facility] 11/05/2018 due to debility and required supplemental oxygen due to DOE. On 04/16, he reported productive cough as well as increase in SOB. CXR done revealing concerns of hydro or hemothorax.  CT chest ordered for work up and revealed right sided empyema with complete collapse of RLE and partial collapse of RML/RUL with reactive mediastinal lymph nodes.  CT surgery and TRH were consulted for assistance and he was discharged to acute hospital for monitoring and treatment. Dr. Dorris Fetch recommended VIR chest tube placement. He underwent drainage of 800 cc thick brown purulent appearing fluid on 4/16 with placement of PleurEvac and continued to have drainage.  He was started on Vanc/Zosyn and cultures positive for E coli therefore narrowed to ampicillin on 11/15/2018.  Chest tube placed to water seal due to air leak and kept in place due to ongoing drainage.  On 4/23, he developed  bradycardia with HR in 30's therefore digoxin discontinued and coreg decreased 4/26 due to ongoing issues. He was started on trial of clamping, received thrombolytics 11/22/2018 and chest tube removed on 4/27.  Follow up CXR with stable right residual empyema without pneumothorax. Dr. Dorris Fetch recommends "probably 4 weeks antibiotic regimen" to treat empyema. He had drop in H/H to 6.4/21.2 on 4/24 and received 1 units PRBCs.  Anemia of chronic disease supplemented with feraheme 4/26. Respiratory status improved but he continues to be severely deconditioned. He is able to tolerate Vital Tilt bed at 60 degrees of 10 minutes of standing.  He has been tolerating OOB for 2 hrs/day.  He was cleared to resume CIR course on 4/29.    Review of Systems  Constitutional: Positive for malaise/fatigue. Negative for chills and fever.  HENT: Negative for hearing loss.   Eyes: Negative for blurred vision and double vision.  Respiratory: Negative for cough and shortness of breath.   Cardiovascular: Negative for chest pain and palpitations.  Gastrointestinal: Negative for constipation, heartburn and nausea.  Genitourinary: Negative for dysuria and urgency.  Musculoskeletal: Negative for back pain, myalgias and neck pain.  Skin: Negative for itching and rash.  Neurological: Positive for sensory change (Right thigh still numb) and weakness. Negative for dizziness.  Psychiatric/Behavioral: The patient is not nervous/anxious and does not have insomnia.   All other systems reviewed and are negative.  Past Medical History:  Diagnosis Date   Acute systolic (congestive) heart failure (HCC) 10/2018   Atrial fibrillation, new onset (HCC) 10/2018  Coronary artery disease    Morbid obesity [facility](HCC)     Past Surgical History:  Procedure Laterality Date   CHEST TUBE INSERTION Right 11/16/2018   ESOPHAGOGASTRODUODENOSCOPY (EGD) WITH PROPOFOL N/A 10/18/2018   Procedure: ESOPHAGOGASTRODUODENOSCOPY (EGD) WITH PROPOFOL;   Surgeon: Kerin SalenKarki, Arya, MD;  [facility];  Service: Gastroenterology;  Laterality: N/A;   IR PERC PLEURAL DRAIN W/INDWELL CATH W/IMG GUIDE  11/12/2018   RIGHT/LEFT HEART CATH AND CORONARY ANGIOGRAPHY N/A 10/09/2018   Procedure: RIGHT/LEFT HEART CATH AND CORONARY ANGIOGRAPHY;  Surgeon: Iran OuchArida, Muhammad A, MD;  Location: [facility] INVASIVE CV LAB;  Service: Cardiovascular;  Laterality: N/A;    Family History  Problem Relation Age of Onset   Alcoholism Father    Rheumatic fever Mother    Diabetes Mother    Drug abuse Brother     Social History:  Married. 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 Wife is a paramedic and works nights. He reports that he has never smoked. He has quit using smokeless tobacco.  His smokeless tobacco use included snuff. He reports that he does not drink alcohol or use drugs.   Allergies: No Known Allergies    Medications Prior to Admission  Medication Sig Dispense Refill   acetaminophen (TYLENOL) 325 MG tablet Take 2 tablets (650 mg total) by mouth every 4 (four) hours as needed for headache or mild pain.     alum & mag hydroxide-simeth (MAALOX/MYLANTA) 200-200-20 MG/5ML suspension Take 30 mLs by mouth every 4 (four) hours as needed for indigestion or heartburn. 355 mL 0   amiodarone (PACERONE) 200 MG tablet Take 1 tablet (200 mg total) by mouth daily. 30 tablet 0   ampicillin 2 g in sodium chloride 0.9 % 100 mL Inject 2 g into the vein every 6 (six) hours. 18 Bag 0   apixaban (ELIQUIS) 5 MG TABS tablet Take 1 tablet (5 mg total) by mouth 2 (two) times daily. 60 tablet 0   carvedilol (COREG) 3.125 MG tablet Take 1 tablet (3.125 mg total) by mouth daily. 30 tablet 0   ferrous sulfate 325 (65 FE) MG tablet Take 1 tablet (325 mg total) by mouth 3 (three) times daily with meals.  3   furosemide (LASIX) 40 MG tablet Take 1 tablet (40 mg total) by mouth daily for 30 days. 30 tablet 0   losartan (COZAAR) 25 MG tablet Take 1  tablet (25 mg total) by mouth daily.     nicotine (NICODERM CQ - DOSED IN MG/24 HOURS) 14 mg/24hr patch Place 1 patch (14 mg total) onto the skin daily. 28 patch 0   pantoprazole (PROTONIX) 40 MG tablet Take 1 tablet (40 mg total) by mouth 2 (two) times daily before a meal.     polyethylene glycol (MIRALAX / GLYCOLAX) packet Take 17 g by mouth 2 (two) times daily. 14 each 0   rosuvastatin (CRESTOR) 20 MG tablet Take 1 tablet (20 mg total) by mouth daily at 6 PM for 30 days. 30 tablet 0    Drug Regimen Review  Drug regimen was reviewed and remains appropriate with no significant issues identified  Home: Home Living Family/patient expects to be discharged to:: Inpatient rehab Living Arrangements: Spouse/significant other Available Help at Discharge: Family, Available 24 hours/day Type of Home: House Home Access: Stairs to enter Enter[employer] CorporationEntrance Stairs-Number of Steps: 5 Entrance Stairs-Rails: Right Home Layout: Multi-level, Able to live on main level with bedroom/bathroom Alternate Level Stairs-Number of Steps: 5 Alternate Level Stairs-Rails: Right  Bathroom Shower/Tub: Engineer, manufacturing systems: Handicapped height Bathroom Accessibility: Yes Home Equipment: Environmental consultant - 2 wheels, Bedside commode  Lives With: Spouse, Son   Functional History: Prior Function Level of Independence: Independent Comments: prior to hospitalization, has been in hospital since 3/5.  has progressed to using [place]ley Dec for transfers  Functional Status:  Mobility: Bed Mobility Overal bed mobility: Needs Assistance Bed Mobility: Supine to Sit Rolling: Min assist Supine to sit: Min assist Sit to supine: Mod assist, +2 for physical assistance General bed mobility comments: scooted toward L side of bed to align prior to using standing feature of bed with min assist to flex LEs Transfers Overall transfer level: Needs assistance Equipment used: Ambulation equipment used Transfer via Lift Equipment: (Vital Go tilt  bed) Transfers: Sit to/from Stand Sit to Stand: +2 physical assistance, Max assist, Total assist General transfer comment: Progressively inclined to stand at 60 degrees in Vital Go tilt bed.  BP was an issue today.  Initially 98/61.  Dropped to 72/59 at 50 degrees tilt,  reclined pt to 35 degrees and BP 88/62.  Then up to 60 degrees tilt with BP 103/66.  Stayed at 60 degree tilt for several min and BP 112/89.  Once pt back to sitting position at 45 degrees, BP 96/62.  Tolerated stand with adjustments for BP.  REhab admissions coordinator came in and may take pt to Rehab today.  Tolerated 10 min of stand.        ADL: ADL Overall ADL's : Needs assistance/impaired Grooming: Wash/dry hands, Wash/dry face, Oral care, Sitting, Minimal assistance Upper Body Bathing: Minimal assistance, Bed level Lower Body Bathing: Total assistance, Bed level Upper Body Dressing : Moderate assistance, Bed level Lower Body Dressing: Total assistance, Bed level Lower Body Dressing Details (indicate cue type and reason): brace Toileting- Clothing Manipulation and Hygiene: Total assistance, Bed level Functional mobility during ADLs: Total assistance, +2 for physical assistance(with sara plus) General ADL Comments: Pt participated in rolling in bed for placement of bed pan.  Total assist for back peri care following BM.  Pt participated in rolling left and right for placement of maxi lift pad in prep for transfer OOB.  Cognition: Cognition Overall Cognitive Status: Within Functional Limits for tasks assessed Orientation Level: Oriented X4 Cognition Arousal/Alertness: Awake/alert Behavior During Therapy: WFL for tasks assessed/performed Overall Cognitive Status: Within Functional Limits for tasks assessed General Comments: Pt nauseaous and not feeling well   Blood pressure 108/70, pulse 89, temperature 98.6 F (37 C), temperature source Oral, resp. rate (!) 0, height 6\' 4"  (1.93 m), weight 119.3 kg, SpO2 96  %. Physical Exam  Nursing note and vitals reviewed. Constitutional: He appears well-developed and well-nourished. No distress.  HENT:  Head: Normocephalic and atraumatic.  Eyes: EOM are normal. Right eye exhibits no discharge. Left eye exhibits no discharge.  Respiratory: Effort normal. No respiratory distress.  GI: He exhibits no distension.  Musculoskeletal:     Comments: BLE with unna boots, bilateral foot edema  Neurological: He is alert.  Motor: Bilateral upper extremities: 5/5 proximal distal Left lower extremity: 4/5 proximal distal Right lower extremity: Flexion, knee extension 2/5, ankle dorsiflexion 4/5  Skin: He is not diaphoretic.  Bilateral lower extremities with Unna boots  Psychiatric: He has a normal mood and affect. His behavior is normal.    Results for orders placed or performed during the hospital encounter of 11/12/18 (from the past 48 hour(s))  Magnesium     Status: None   Collection Time: 11/24/18  4:59 AM  Result Value Ref Range   Magnesium 1.8 1.7 - 2.4 mg/dL    Comment: Performed at [facility], [address], [place], [place] [address]  CBC with Differential/Platelet     Status: Abnormal   Collection Time: 11/24/18  1:00 PM  Result Value Ref Range   WBC 8.8 4.0 - 10.5 K/uL   RBC 2.54 (L) 4.22 - 5.81 MIL/uL   Hemoglobin 7.6 (L) 13.0 - 17.0 g/dL   HCT 60.4 (L) 54.0 - 98.1 %   MCV 94.9 80.0 - 100.0 fL   MCH 29.9 26.0 - 34.0 pg   MCHC 31.5 30.0 - 36.0 g/dL   RDW 19.1 (H) 47.8 - 29.5 %   Platelets 225 150 - 400 K/uL   nRBC 0.0 0.0 - 0.2 %   Neutrophils Relative % 77 %   Neutro Abs 6.8 1.7 - 7.7 K/uL   Lymphocytes Relative 12 %   Lymphs Abs 1.1 0.7 - 4.0 K/uL   Monocytes Relative 8 %   Monocytes Absolute 0.7 0.1 - 1.0 K/uL   Eosinophils Relative 1 %   Eosinophils Absolute 0.1 0.0 - 0.5 K/uL   Basophils Relative 1 %   Basophils Absolute 0.0 0.0 - 0.1 K/uL   WBC Morphology See Note     Comment: Mild Left Shift. 1 to 5% Metas and Myelos, Occ  Pro Noted.   Immature Granulocytes 1 %   Abs Immature Granulocytes 0.06 0.00 - 0.07 K/uL   Polychromasia PRESENT     Comment: Performed at [facility], [address], [place], [place] [address]  Comprehensive metabolic panel     Status: Abnormal   Collection Time: 11/24/18  1:00 PM  Result Value Ref Range   Sodium 138 135 - 145 mmol/L   Potassium 3.6 3.5 - 5.1 mmol/L   Chloride 101 98 - 111 mmol/L   CO2 26 22 - 32 mmol/L   Glucose, Bld 92 70 - 99 mg/dL   BUN 17 6 - 20 mg/dL   Creatinine, Ser 8.65 0.61 - 1.24 mg/dL   Calcium 8.5 (L) 8.9 - 10.3 mg/dL   Total Protein 6.0 (L) 6.5 - 8.1 g/dL   Albumin 2.0 (L) 3.5 - 5.0 g/dL   AST 20 15 - 41 U/L   ALT 25 0 - 44 U/L   Alkaline Phosphatase 89 38 - 126 U/L   Total Bilirubin 0.9 0.3 - 1.2 mg/dL   GFR calc non Af Amer >60 >60 mL/min   GFR calc Af Amer >60 >60 mL/min   Anion gap 11 5 - 15    Comment: Performed at [facility], [address], [place], [place] [address]   Dg Chest Port 1 View  Result Date: 11/24/2018 CLINICAL DATA:  Pleural effusion.  Chest tube removed yesterday. EXAM: PORTABLE CHEST 1 VIEW COMPARISON:  November 23, 2018 FINDINGS: Persistent right effusion with underlying opacity, stable in the interval. Minimal opacity in the right mid lung is stable as well. No pneumothorax. The left PICC line is in good position terminating in the central SVC. The left lung remains clear. Stable cardiomegaly. The hila and mediastinum are unremarkable. IMPRESSION: 1. The right-sided pleural effusion with underlying opacity and the mild opacity in the right mid lung are stable. 2. The left PICC line is in good position. 3. No changes. Electronically Signed   By: Gerome Sam III M.D   On: 11/24/2018 08:43    Medical Problem List and Plan:  1.  Deficits with mobility, transfers, endurance, weakness secondary to multifactorial debility.  Admit to CIR 2.  Antithrombotics: -DVT/anticoagulation:  Pharmaceutical: Other  (comment)--Eliquis. Continue to monitor hemoglobin as well as signs of bleeding.  -antiplatelet therapy: N/A 3. Pain Management: Tylenol prn 4. Mood: LCSW to follow for evaluate   -antipsychotic agents: N/A 5. Neuropsych: This patient is capable of making decisions on his own behalf. 6. Skin/Wound Care: Routine pressure relief measures.  7. Fluids/Electrolytes/Nutrition: Monitor I/O. Check BMP tomorrow a.m.  Continue ensure bid.  8. Empyema due to E coli: Continue ampicillin --Antibiotic day # 10/28.  CT chest prior to d/c antibiotics? 9. ABLA due to GIB/spontaneous thigh hematoma: Continue protonix bid. Received 1 unit PRBC 4/24 and feraheme 4/26.   Now on iron supplement. Add folic acid due to low folate levels.  CBC ordered for tomorrow a.m. 10. ICM/Acute systolic CHF: Monitor daily weights.  Monitor for signs/symptoms of fluid overload.  BP remains soft--no ARB. Continue lasix, coreg and Crestor.  11. 3 vessel CAD: Not a surgical candidate-treated medically 12. Paroxymal A Fib: Monitor HR bid-continue amiodarone, coreg and Eliquis. Cards recommends re-eval prior to discharge.  13. Bradycardia: Heart rate improving with decrease in Coreg and d/c of digoxin.  Monitor with increased physical activity. 14. Protein calorie malnutrition: Continue to offer supplements tid. Magnesium supplemented yesterday due to low normal levels. Will add Mg supplement..  15.Orthostatic hypotension: Keep HOB > 30 degrees. Continue Unna boots for edema control and BP support.  Abdominal binder if needed.   Jacquelynn Cree, PA-C 11/25/2018

## 2018-11-25 ENCOUNTER — Encounter (HOSPITAL_COMMUNITY): Payer: Self-pay

## 2018-11-25 ENCOUNTER — Other Ambulatory Visit: Payer: Self-pay

## 2018-11-25 ENCOUNTER — Inpatient Hospital Stay (HOSPITAL_COMMUNITY)
Admission: RE | Admit: 2018-11-25 | Discharge: 2018-12-18 | DRG: 945 | Disposition: A | Discharge status: Home Health | Payer: Medicaid Other | Source: Intra-hospital | Attending: Physical Medicine & Rehabilitation | Admitting: Physical Medicine & Rehabilitation

## 2018-11-25 DIAGNOSIS — E785 Hyperlipidemia, unspecified: Secondary | ICD-10-CM | POA: Diagnosis present

## 2018-11-25 DIAGNOSIS — J869 Pyothorax without fistula: Secondary | ICD-10-CM | POA: Diagnosis present

## 2018-11-25 DIAGNOSIS — Z813 Family history of other psychoactive substance abuse and dependence: Secondary | ICD-10-CM

## 2018-11-25 DIAGNOSIS — Z87891 Personal history of nicotine dependence: Secondary | ICD-10-CM

## 2018-11-25 DIAGNOSIS — Z8719 Personal history of other diseases of the digestive system: Secondary | ICD-10-CM

## 2018-11-25 DIAGNOSIS — I480 Paroxysmal atrial fibrillation: Secondary | ICD-10-CM | POA: Diagnosis not present

## 2018-11-25 DIAGNOSIS — I4892 Unspecified atrial flutter: Secondary | ICD-10-CM | POA: Diagnosis not present

## 2018-11-25 DIAGNOSIS — R601 Generalized edema: Secondary | ICD-10-CM | POA: Diagnosis present

## 2018-11-25 DIAGNOSIS — S7011XD Contusion of right thigh, subsequent encounter: Secondary | ICD-10-CM | POA: Diagnosis not present

## 2018-11-25 DIAGNOSIS — Y92230 Patient room in hospital as the place of occurrence of the external cause: Secondary | ICD-10-CM | POA: Diagnosis not present

## 2018-11-25 DIAGNOSIS — I2510 Atherosclerotic heart disease of native coronary artery without angina pectoris: Secondary | ICD-10-CM

## 2018-11-25 DIAGNOSIS — J9811 Atelectasis: Secondary | ICD-10-CM | POA: Diagnosis not present

## 2018-11-25 DIAGNOSIS — Z7901 Long term (current) use of anticoagulants: Secondary | ICD-10-CM | POA: Diagnosis not present

## 2018-11-25 DIAGNOSIS — I255 Ischemic cardiomyopathy: Secondary | ICD-10-CM | POA: Diagnosis present

## 2018-11-25 DIAGNOSIS — D509 Iron deficiency anemia, unspecified: Secondary | ICD-10-CM | POA: Diagnosis present

## 2018-11-25 DIAGNOSIS — R799 Abnormal finding of blood chemistry, unspecified: Secondary | ICD-10-CM

## 2018-11-25 DIAGNOSIS — R001 Bradycardia, unspecified: Secondary | ICD-10-CM | POA: Diagnosis present

## 2018-11-25 DIAGNOSIS — I951 Orthostatic hypotension: Secondary | ICD-10-CM | POA: Diagnosis present

## 2018-11-25 DIAGNOSIS — X58XXXD Exposure to other specified factors, subsequent encounter: Secondary | ICD-10-CM | POA: Diagnosis present

## 2018-11-25 DIAGNOSIS — J439 Emphysema, unspecified: Secondary | ICD-10-CM | POA: Diagnosis present

## 2018-11-25 DIAGNOSIS — I4891 Unspecified atrial fibrillation: Secondary | ICD-10-CM

## 2018-11-25 DIAGNOSIS — E43 Unspecified severe protein-calorie malnutrition: Secondary | ICD-10-CM | POA: Diagnosis present

## 2018-11-25 DIAGNOSIS — G4733 Obstructive sleep apnea (adult) (pediatric): Secondary | ICD-10-CM | POA: Diagnosis present

## 2018-11-25 DIAGNOSIS — Z79899 Other long term (current) drug therapy: Secondary | ICD-10-CM | POA: Diagnosis not present

## 2018-11-25 DIAGNOSIS — M2550 Pain in unspecified joint: Secondary | ICD-10-CM | POA: Diagnosis not present

## 2018-11-25 DIAGNOSIS — D62 Acute posthemorrhagic anemia: Secondary | ICD-10-CM

## 2018-11-25 DIAGNOSIS — I5022 Chronic systolic (congestive) heart failure: Secondary | ICD-10-CM | POA: Diagnosis not present

## 2018-11-25 DIAGNOSIS — D638 Anemia in other chronic diseases classified elsewhere: Secondary | ICD-10-CM | POA: Diagnosis present

## 2018-11-25 DIAGNOSIS — F410 Panic disorder [episodic paroxysmal anxiety] without agoraphobia: Secondary | ICD-10-CM | POA: Diagnosis not present

## 2018-11-25 DIAGNOSIS — G629 Polyneuropathy, unspecified: Secondary | ICD-10-CM | POA: Diagnosis present

## 2018-11-25 DIAGNOSIS — I5021 Acute systolic (congestive) heart failure: Secondary | ICD-10-CM

## 2018-11-25 DIAGNOSIS — Z811 Family history of alcohol abuse and dependence: Secondary | ICD-10-CM | POA: Diagnosis not present

## 2018-11-25 DIAGNOSIS — E538 Deficiency of other specified B group vitamins: Secondary | ICD-10-CM | POA: Diagnosis present

## 2018-11-25 DIAGNOSIS — Z09 Encounter for follow-up examination after completed treatment for conditions other than malignant neoplasm: Secondary | ICD-10-CM

## 2018-11-25 DIAGNOSIS — Z833 Family history of diabetes mellitus: Secondary | ICD-10-CM | POA: Diagnosis not present

## 2018-11-25 DIAGNOSIS — B9620 Unspecified Escherichia coli [E. coli] as the cause of diseases classified elsewhere: Secondary | ICD-10-CM | POA: Diagnosis present

## 2018-11-25 DIAGNOSIS — R0902 Hypoxemia: Secondary | ICD-10-CM | POA: Diagnosis not present

## 2018-11-25 DIAGNOSIS — J9601 Acute respiratory failure with hypoxia: Secondary | ICD-10-CM

## 2018-11-25 DIAGNOSIS — Z6835 Body mass index (BMI) 35.0-35.9, adult: Secondary | ICD-10-CM | POA: Diagnosis not present

## 2018-11-25 DIAGNOSIS — Z5329 Procedure and treatment not carried out because of patient's decision for other reasons: Secondary | ICD-10-CM | POA: Diagnosis present

## 2018-11-25 DIAGNOSIS — R0602 Shortness of breath: Secondary | ICD-10-CM

## 2018-11-25 DIAGNOSIS — R5381 Other malaise: Secondary | ICD-10-CM | POA: Diagnosis not present

## 2018-11-25 DIAGNOSIS — J90 Pleural effusion, not elsewhere classified: Secondary | ICD-10-CM | POA: Diagnosis not present

## 2018-11-25 DIAGNOSIS — Z7401 Bed confinement status: Secondary | ICD-10-CM | POA: Diagnosis not present

## 2018-11-25 DIAGNOSIS — R9431 Abnormal electrocardiogram [ECG] [EKG]: Secondary | ICD-10-CM | POA: Diagnosis not present

## 2018-11-25 DIAGNOSIS — T462X5A Adverse effect of other antidysrhythmic drugs, initial encounter: Secondary | ICD-10-CM | POA: Diagnosis not present

## 2018-11-25 MED ORDER — GUAIFENESIN-DM 100-10 MG/5ML PO SYRP: 5.0000 mL | ORAL_SOLUTION | Freq: Four times a day (QID) | ORAL | Status: DC | PRN

## 2018-11-25 MED ORDER — SODIUM CHLORIDE 0.9% FLUSH
10.0000 mL | INTRAVENOUS | Status: DC | PRN
  Administered 2018-12-01 – 2018-12-08 (×4): 10 mL
  Administered 2018-12-10 – 2018-12-15 (×2): 20 mL
  Filled 2018-11-25 (×6): qty 40

## 2018-11-25 MED ORDER — ONDANSETRON HCL 4 MG/2ML IJ SOLN
4.0000 mg | Freq: Four times a day (QID) | INTRAMUSCULAR | Status: DC | PRN
  Administered 2018-11-30 – 2018-12-02 (×2): 4 mg via INTRAVENOUS
  Filled 2018-11-25 (×2): qty 2

## 2018-11-25 MED ORDER — FUROSEMIDE 40 MG PO TABS
40.0000 mg | ORAL_TABLET | Freq: Every day | ORAL | Status: DC
  Administered 2018-11-26 – 2018-11-28 (×3): 40 mg via ORAL
  Filled 2018-11-25 (×4): qty 1

## 2018-11-25 MED ORDER — AMIODARONE HCL 200 MG PO TABS
200.0000 mg | ORAL_TABLET | Freq: Every day | ORAL | Status: DC
  Administered 2018-11-26 – 2018-12-02 (×7): 200 mg via ORAL
  Filled 2018-11-25 (×7): qty 1

## 2018-11-25 MED ORDER — ACETAMINOPHEN 325 MG PO TABS: 325.0000 mg | ORAL_TABLET | ORAL | Status: DC | PRN

## 2018-11-25 MED ORDER — ALUM & MAG HYDROXIDE-SIMETH 200-200-20 MG/5ML PO SUSP: 30.0000 mL | ORAL | Status: DC | PRN

## 2018-11-25 MED ORDER — MAGNESIUM OXIDE 400 (241.3 MG) MG PO TABS
400.0000 mg | ORAL_TABLET | Freq: Two times a day (BID) | ORAL | Status: DC
  Administered 2018-11-25 – 2018-12-18 (×46): 400 mg via ORAL
  Filled 2018-11-25 (×46): qty 1

## 2018-11-25 MED ORDER — FOLIC ACID 1 MG PO TABS
1.0000 mg | ORAL_TABLET | Freq: Every day | ORAL | Status: DC
  Administered 2018-11-26 – 2018-12-18 (×23): 1 mg via ORAL
  Filled 2018-11-25 (×23): qty 1

## 2018-11-25 MED ORDER — PROCHLORPERAZINE 25 MG RE SUPP: 12.5000 mg | Freq: Four times a day (QID) | RECTAL | Status: DC | PRN

## 2018-11-25 MED ORDER — CARVEDILOL 3.125 MG PO TABS
3.1250 mg | ORAL_TABLET | Freq: Every day | ORAL | Status: DC
  Administered 2018-11-26 – 2018-11-30 (×4): 3.125 mg via ORAL
  Filled 2018-11-25 (×5): qty 1

## 2018-11-25 MED ORDER — PROCHLORPERAZINE EDISYLATE 10 MG/2ML IJ SOLN: 5.0000 mg | Freq: Four times a day (QID) | INTRAMUSCULAR | Status: DC | PRN

## 2018-11-25 MED ORDER — FERROUS SULFATE 325 (65 FE) MG PO TABS
325.0000 mg | ORAL_TABLET | Freq: Three times a day (TID) | ORAL | Status: DC
  Administered 2018-11-25 – 2018-12-18 (×67): 325 mg via ORAL
  Filled 2018-11-25 (×67): qty 1

## 2018-11-25 MED ORDER — APIXABAN 5 MG PO TABS
5.0000 mg | ORAL_TABLET | Freq: Two times a day (BID) | ORAL | Status: DC
  Administered 2018-11-25 – 2018-12-18 (×46): 5 mg via ORAL
  Filled 2018-11-25 (×46): qty 1

## 2018-11-25 MED ORDER — PANTOPRAZOLE SODIUM 40 MG PO TBEC
40.0000 mg | DELAYED_RELEASE_TABLET | Freq: Two times a day (BID) | ORAL | Status: DC
  Administered 2018-11-25 – 2018-12-18 (×46): 40 mg via ORAL
  Filled 2018-11-25 (×47): qty 1

## 2018-11-25 MED ORDER — BISACODYL 10 MG RE SUPP: 10.0000 mg | Freq: Every day | RECTAL | Status: DC | PRN

## 2018-11-25 MED ORDER — ROSUVASTATIN CALCIUM 20 MG PO TABS
20.0000 mg | ORAL_TABLET | Freq: Every day | ORAL | Status: DC
  Administered 2018-11-25 – 2018-12-17 (×23): 20 mg via ORAL
  Filled 2018-11-25 (×23): qty 1

## 2018-11-25 MED ORDER — FLEET ENEMA 7-19 GM/118ML RE ENEM: 1.0000 | ENEMA | Freq: Once | RECTAL | Status: DC | PRN

## 2018-11-25 MED ORDER — TRAZODONE HCL 50 MG PO TABS: 25.0000 mg | ORAL_TABLET | Freq: Every evening | ORAL | Status: DC | PRN

## 2018-11-25 MED ORDER — SODIUM CHLORIDE 0.9 % IV SOLN
INTRAVENOUS | Status: DC | PRN
  Administered 2018-11-25 – 2018-11-26 (×2): 250 mL via INTRAVENOUS
  Administered 2018-11-28: 1000 mL via INTRAVENOUS
  Administered 2018-12-03: 21:00:00 10 mL/h via INTRAVENOUS

## 2018-11-25 MED ORDER — SODIUM CHLORIDE 0.9 % IV SOLN
2.0000 g | Freq: Four times a day (QID) | INTRAVENOUS | Status: DC
  Administered 2018-11-25 – 2018-12-14 (×75): 2 g via INTRAVENOUS
  Filled 2018-11-25 (×81): qty 2000

## 2018-11-25 MED ORDER — POLYETHYLENE GLYCOL 3350 17 G PO PACK
17.0000 g | PACK | Freq: Two times a day (BID) | ORAL | Status: DC
  Administered 2018-11-25 – 2018-12-18 (×36): 17 g via ORAL
  Filled 2018-11-25 (×41): qty 1

## 2018-11-25 MED ORDER — DIPHENHYDRAMINE HCL 12.5 MG/5ML PO ELIX: 12.5000 mg | ORAL_SOLUTION | Freq: Four times a day (QID) | ORAL | Status: DC | PRN

## 2018-11-25 MED ORDER — FERROUS SULFATE 325 (65 FE) MG PO TABS: 325.0000 mg | ORAL_TABLET | Freq: Three times a day (TID) | ORAL | 3 refills | Status: DC

## 2018-11-25 MED ORDER — PROCHLORPERAZINE MALEATE 5 MG PO TABS: 5.0000 mg | ORAL_TABLET | Freq: Four times a day (QID) | ORAL | Status: DC | PRN

## 2018-11-25 MED ORDER — ENSURE ENLIVE PO LIQD
237.0000 mL | Freq: Three times a day (TID) | ORAL | Status: DC
  Administered 2018-11-25 – 2018-12-07 (×33): 237 mL via ORAL

## 2018-11-25 MED ORDER — POLYETHYLENE GLYCOL 3350 17 G PO PACK
17.0000 g | PACK | Freq: Every day | ORAL | Status: DC | PRN
  Filled 2018-11-25: qty 1

## 2018-11-25 MED ORDER — CARVEDILOL 3.125 MG PO TABS: 3.1250 mg | ORAL_TABLET | Freq: Every day | ORAL | 0 refills | Status: DC

## 2018-11-25 MED ORDER — SODIUM CHLORIDE 0.9 % IV SOLN: 2.0000 g | Freq: Four times a day (QID) | INTRAVENOUS | 0 refills | Status: DC

## 2018-11-25 MED ORDER — AMIODARONE HCL 200 MG PO TABS: 200.0000 mg | ORAL_TABLET | Freq: Every day | ORAL | 0 refills | Status: DC

## 2018-11-25 NOTE — Progress Notes (Signed)
Physical Therapy Treatment Patient Details Name: Read Scharr MRN: [id number] DOB: 04-24-1966 Today's Date: 11/25/2018    History of Present Illness 53 year old male with no significant past medical history until her recent prolonged hospitalization from 09/30/20/9/20 for new onset ischemic CHF (ICM), three-vessel CAD (not a candidate for CABG per CTS), new onset A. Fib, spontaneous right thigh hematoma and GI bleed.  His hospital course was complicated by a dynamic ileus/SBO requiring NG tube.  He was discharged to CIR, returned to acute with progressive weakness and dyspnea over the last 2 to 3 days. CT chest was obtained and concerning for empyema. Chest tube placement 4/16    PT Comments    Pt admitted with above diagnosis. Pt currently with functional limitations due to balance and endurance deficits. Pt was able to tolerate Vital go tilt bed at 35-60 degrees for 10 min.  BP was orthostatic but pt pushed through and it did recover throughout session.  Rehab present and was able to see progression.  May admit pt today.  Pt will benefit from skilled PT to increase their independence and safety with mobility to allow discharge to the venue listed below.    Follow Up Recommendations  CIR;Supervision/Assistance - 24 hour     Equipment Recommendations  Other (comment)(TBD)    Recommendations for Other Services       Precautions / Restrictions Precautions Precautions: Fall Precaution Comments: monitor BP, hypotensive in sitting/standing Required Braces or Orthoses: Other Brace Other Brace: RLE Bledsoe brace locked in extension when ambulating that can be d/c'd when pt can perform 10 SLRs without assist Restrictions Weight Bearing Restrictions: No RLE Weight Bearing: Weight bearing as tolerated LLE Weight Bearing: Weight bearing as tolerated    Mobility  Bed Mobility Overal bed mobility: Needs Assistance Bed Mobility: Supine to Sit Rolling: Min assist             Transfers Overall transfer level: Needs assistance   Transfers: Sit to/from Stand Sit to Stand: +2 physical assistance;Max assist;Total assist         General transfer comment: Progressively inclined to stand at 60 degrees in Vital Go tilt bed.  BP was an issue today.  Initially 98/61.  Dropped to 72/59 at 50 degrees tilt,  reclined pt to 35 degrees and BP 88/62.  Then up to 60 degrees tilt with BP 103/66.  Stayed at 60 degree tilt for several min and BP 112/89.  Once pt back to sitting position at 45 degrees, BP 96/62.  Tolerated stand with adjustments for BP.  REhab admissions coordinator came in and may take pt to Rehab today.  Tolerated 10 min of stand.    Ambulation/Gait                 Stairs             Wheelchair Mobility    Modified Rankin (Stroke Patients Only)       Balance           Standing balance support: No upper extremity supported;During functional activity Standing balance-Leahy Scale: Poor Standing balance comment: Used of Vital go tilt bed                            Cognition Arousal/Alertness: Awake/alert Behavior During Therapy: WFL for tasks assessed/performed Overall Cognitive Status: Within Functional Limits for tasks assessed  Exercises Total Joint Exercises Quad Sets: AROM;Both;Standing;20 reps Gluteal Sets: AROM;20 reps;Both;Standing Heel Slides: AROM;Both;5 reps;Supine Low Level/ICU Exercises Stabilized Bridging: AROM;Both;5 reps;Supine Other Exercises Other Exercises: Bilateral hip/trunk extension while tilted    General Comments        Pertinent Vitals/Pain Pain Assessment: Faces Faces Pain Scale: Hurts little more Pain Location: ribs Pain Descriptors / Indicators: Aching Pain Intervention(s): Limited activity within patient's tolerance;Monitored during session;Repositioned    Home Living   Living Arrangements: Spouse/significant  other Available Help at Discharge: Family;Available 24 hours/day Type of Home: House Home Access: Stairs to enter Entrance Stairs-Rails: Right Home Layout: Multi-level;Able to live on main level with bedroom/bathroom Home Equipment: Dan Humphreys - 2 wheels;Bedside commode      Prior Function Level of Independence: Independent      Comments: prior to hospitalization, has been in hospital since 3/5.  has progressed to using [place] Dec for transfers   PT Goals (current goals can now be found in the care plan section) Acute Rehab PT Goals Patient Stated Goal: to get stronger Progress towards PT goals: Progressing toward goals    Frequency    Min 3X/week      PT Plan Current plan remains appropriate    Co-evaluation PT/OT/SLP Co-Evaluation/Treatment: Yes Reason for Co-Treatment: Complexity of the patient's impairments (multi-system involvement);For patient/therapist safety PT goals addressed during session: Mobility/safety with mobility        AM-PAC PT "6 Clicks" Mobility   Outcome Measure  Help needed turning from your back to your side while in a flat bed without using bedrails?: A Little Help needed moving from lying on your back to sitting on the side of a flat bed without using bedrails?: A Lot Help needed moving to and from a bed to a chair (including a wheelchair)?: A Lot Help needed standing up from a chair using your arms (e.g., wheelchair or bedside chair)?: A Lot Help needed to walk in hospital room?: Total Help needed climbing 3-5 steps with a railing? : Total 6 Click Score: 11    End of Session   Activity Tolerance: Patient tolerated treatment well Patient left: with call bell/phone within reach;in bed Nurse Communication: Mobility status PT Visit Diagnosis: Muscle weakness (generalized) (M62.81);Difficulty in walking, not elsewhere classified (R26.2);Pain Pain - part of body: (chest tube site)     Time: 7517-0017 PT Time Calculation (min) (ACUTE ONLY): 34  min  Charges:  $Therapeutic Activity: 8-22 mins                     Zacaria Pousson,PT Acute Rehabilitation Services Pager:  [phone]  Office:  [phone]     Berline Lopes 11/25/2018, 10:18 AM

## 2018-11-25 NOTE — Progress Notes (Addendum)
Stephania Fragmin, PT  Rehab Admission Coordinator  Physical Medicine and Rehabilitation  PMR Pre-admission  Signed  Date of Service:  11/25/2018 10:12 AM       Related encounter: Admission (Current) from 11/12/2018 in [facility] 4E CV SURGICAL PROGRESSIVE CARE      Signed         Show:Clear all Manual[x] Template[x] Copied  Added by: Stephania Fragmin, PT  Hover for details PMR Admission Coordinator Pre-Admission Assessment  Patient: Aaron [name] is an 53 y.o., male MRN: [id number] DOB: [birth year] Height:  (193 cm) Weight: 119.3 kg                                                                                                                                                  Insurance Information HMO:     PPO:      PCP:      IPA:      80/20:      OTHER:  PRIMARY: Uninsured.  Follow up with Frances Maywood [phone]) for Medicaid and Disability application.   Medicaid Application Date: 10/16/2018      Case Manager: Amanda Cockayne [email or web address], [phone]) Disability Application Date:       Case Worker:   The Data Collection Information Summary for patients in Inpatient Rehabilitation Facilities with attached Privacy Act Statement-Health Care Records was provided and verbally reviewed with: N/A  Emergency Contact Information         Contact Information    Name Relation Home Work Mobile   [facility] Spouse [phone]     [place], [place] [phone]       Current Medical History  Patient Admitting Diagnosis: Debility 2/2 congestive heart failure  History of Present Illness: Aaron [name] is a 53 year old male without significant medical history who was originally admitted on 10/01/18 with 3-4 weeks history of progressive edema BLE with SOB difficulty walking. He was found to have fluid overload with anasarca, ascites, AKI, and abnormal LFTs. 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 Hospital course complicated by A fib with RVR but patient developed spontaneous right thigh hematoma 3/18 as well as GIB due to esophageal ulcers with gastritis and esophagitis on 3/22. He has also had issue with abdominal pain due to ileus Tx with NGT, A fib with NSVT requiring amiodarone for rate control, hyptension reated with NGT, acute systolic CHF as well as issues with hypotension requiring tilt table for standing. (See prior H&P for full details) He was admitted to CIR 11/05/18 due to severe debility and has required supplemental oxygen due to hypoxia and DOE with activity. On 04/16, he reported productive cough as well as increase in SOB. CXR done revealing concerns of hydro or hemothorax. CT chest ordered for work up and revealed right sided empyema with complete collapse of RLE  and partial collapse of RML/RUL with reactive mediastinal lymph nodes. CT surgery and TRH were consulted for assistance and he was discharged to acute hospital for monitoring and treatment.   Dr. Dorris Fetch recommended CT placement by radiology. He underwent drainage of 800 cc thick brown purulent appearing fluid on 4/16 with placement of PleurEvac and continued to have drainage. He was started on Vanc/Zosyn and cultures positive for E coli therefore narrowed to ampicillin on 4/19. Chest tube placed to water seal due to air leak and kept in place due to ongoing drainage. On 4/23, he developed bradycardia with HR in 30's therefore digoxin discontinued and coreg decreased 4/26 due to ongoing issues. He was started on trial of clamping, received thrombolytics 4/26 and chest tube removed on 4/27. Follow up CXR with stable persistent right loculated effusion without pneumothorax. Dr. Dorris Fetch recommends "probably 4 weeks antibiotic regimen" to treat empyema. Anemia of chronic disease supplemented with feraheme 4/26.  Pt with ongoing issues with orthostasis, CIR team aware.    Glasgow Coma Scale Score:  15  Past Medical History      Past Medical History:  Diagnosis Date   Acute systolic (congestive) heart failure (HCC) 10/2018   Atrial fibrillation, new onset (HCC) 10/2018   Coronary artery disease    Morbid obesity (HCC)     Family History  family history includes Alcoholism in his father; Diabetes in his mother; Drug abuse in his brother; Rheumatic fever in his mother.  Prior Rehab/Hospitalizations:  Has the patient had prior rehab or hospitalizations prior to admission? No and pt with long hospitalization from 10/01/2018 to present  Has the patient had major surgery during 100 days prior to admission? Yes. Chest tube placement on 11/12/2018  Current Medications   Current Facility-Administered Medications:    0.9 %  sodium chloride infusion (Manually program via Guardrails IV Fluids), , Intravenous, Once, Zigmund Daniel., MD   acetaminophen (TYLENOL) tablet 650 mg, 650 mg, Oral, Q4H PRN, Zigmund Daniel., MD, 650 mg at 11/13/18 0003   alum & mag hydroxide-simeth (MAALOX/MYLANTA) 200-200-20 MG/5ML suspension 30 mL, 30 mL, Oral, Q4H PRN, Zigmund Daniel., MD   amiodarone (PACERONE) tablet 200 mg, 200 mg, Oral, Daily, Zigmund Daniel., MD, 200 mg at 11/24/18 0908   ampicillin (OMNIPEN) 2 g in sodium chloride 0.9 % 100 mL IVPB, 2 g, Intravenous, Q6H, Zigmund Daniel., MD, Last Rate: 300 mL/hr at 11/25/18 0336, 2 g at 11/25/18 0336   apixaban (ELIQUIS) tablet 5 mg, 5 mg, Oral, BID, Zigmund Daniel., MD, 5 mg at 11/24/18 2117   carvedilol (COREG) tablet 3.125 mg, 3.125 mg, Oral, Daily, Alwyn Ren, MD, 3.125 mg at 11/24/18 1610   feeding supplement (ENSURE ENLIVE) (ENSURE ENLIVE) liquid 237 mL, 237 mL, Oral, TID BM, Zigmund Daniel., MD, 237 mL at 11/25/18 0843   ferrous sulfate tablet 325 mg, 325 mg, Oral, TID WC, Alwyn Ren, MD, 325 mg at 11/25/18 0843   furosemide (LASIX) tablet 40 mg, 40 mg, Oral,  Daily, Zigmund Daniel., MD, 40 mg at 11/24/18 0909   lidocaine (XYLOCAINE) 1 % (with pres) injection, , Infiltration, PRN, Richarda Overlie, MD, 15 mL at 11/12/18 1659   ondansetron (ZOFRAN) injection 4 mg, 4 mg, Intravenous, Q6H PRN, Zigmund Daniel., MD   pantoprazole (PROTONIX) EC tablet 40 mg, 40 mg, Oral, BID AC, Zigmund Daniel., MD, 40 mg at 11/25/18 0843   polyethylene glycol (  MIRALAX / GLYCOLAX) packet 17 g, 17 g, Oral, BID, Zigmund Daniel., MD, 17 g at 11/24/18 2117   rosuvastatin (CRESTOR) tablet 20 mg, 20 mg, Oral, q1800, Zigmund Daniel., MD, 20 mg at 11/24/18 1746   sodium chloride flush (NS) 0.9 % injection 10-40 mL, 10-40 mL, Intracatheter, PRN, Zigmund Daniel., MD  Patients Current Diet:     Diet Order                  Diet Heart Room service appropriate? Yes with Assist; Fluid consistency: Thin  Diet effective now               Precautions / Restrictions Precautions Precautions: Fall Precaution Comments: monitor BP, hypotensive in sitting/standing Other Brace: RLE Bledsoe brace locked in extension when ambulating that can be d/c'd when pt can perform 10 SLRs without assist Restrictions Weight Bearing Restrictions: No RLE Weight Bearing: Weight bearing as tolerated LLE Weight Bearing: Weight bearing as tolerated   Has the patient had 2 or more falls or a fall with injury in the past year?No  Prior Activity Level Community (5-7x/wk): pt with prolonged hospitalization (original admission date 10/01/2018), and 1 month PTA pt noticed steady decline in health  Prior Functional Level Prior Function Level of Independence: Independent Comments: prior to hospitalization, has been in hospital since 3/5.  has progressed to using [place] Dec for transfers  Self Care: Did the patient need help bathing, dressing, using the toilet or eating?  Independent  Indoor Mobility: Did the patient need assistance with walking from room to  room (with or without device)? Independent  Stairs: Did the patient need assistance with internal or external stairs (with or without device)? Independent  Functional Cognition: Did the patient need help planning regular tasks such as shopping or remembering to take medications? Independent  Home Assistive Devices / Equipment Home Assistive Devices/Equipment: None Home Equipment: Walker - 2 wheels, Bedside commode  Prior Device Use: Indicate devices/aids used by the patient prior to current illness, exacerbation or injury? None of the above  Current Functional Level Cognition  Overall Cognitive Status: Within Functional Limits for tasks assessed Orientation Level: Oriented X4 General Comments: Pt nauseaous and not feeling well    Extremity Assessment (includes Sensation/Coordination)  Upper Extremity Assessment: Defer to OT evaluation  Lower Extremity Assessment: RLE deficits/detail, Generalized weakness RLE Deficits / Details: hx of R LE hematoma and placement of Bledsoe brace by orthopedist, ROM of hip and knee limited    ADLs  Overall ADL's : Needs assistance/impaired Grooming: Wash/dry hands, Wash/dry face, Oral care, Sitting, Minimal assistance Upper Body Bathing: Minimal assistance, Bed level Lower Body Bathing: Total assistance, Bed level Upper Body Dressing : Moderate assistance, Bed level Lower Body Dressing: Total assistance, Bed level Lower Body Dressing Details (indicate cue type and reason): brace Toileting- Clothing Manipulation and Hygiene: Total assistance, Bed level Functional mobility during ADLs: Total assistance, +2 for physical assistance(with sara plus) General ADL Comments: Pt participated in rolling in bed for placement of bed pan.  Total assist for back peri care following BM.  Pt participated in rolling left and right for placement of maxi lift pad in prep for transfer OOB.    Mobility  Overal bed mobility: Needs Assistance Bed Mobility:  Supine to Sit Rolling: Min assist Supine to sit: Min assist Sit to supine: Mod assist, +2 for physical assistance General bed mobility comments: scooted toward L side of bed to align prior to using standing feature of  bed with min assist to flex LEs    Transfers  Overall transfer level: Needs assistance Equipment used: Ambulation equipment used Transfer via Lift Equipment: (Vital Go tilt bed) Transfers: Sit to/from Stand Sit to Stand: +2 physical assistance, Max assist, Total assist General transfer comment: Progressively inclined to stand at 60 degrees in Vital Go tilt bed.  BP was an issue today.  Initially 98/61.  Dropped to 72/59 at 50 degrees tilt,  reclined pt to 35 degrees and BP 88/62.  Then up to 60 degrees tilt with BP 103/66.  Stayed at 60 degree tilt for several min and BP 112/89.  Once pt back to sitting position at 45 degrees, BP 96/62.  Tolerated stand with adjustments for BP.  REhab admissions coordinator came in and may take pt to Rehab today.  Tolerated 10 min of stand.      Ambulation / Gait / Stairs / Engineer, drilling / Balance Dynamic Sitting Balance Sitting balance - Comments: sat EOB with min guard assist  Balance Overall balance assessment: Needs assistance Sitting-balance support: Bilateral upper extremity supported Sitting balance-Leahy Scale: Fair Sitting balance - Comments: sat EOB with min guard assist  Standing balance support: No upper extremity supported, During functional activity Standing balance-Leahy Scale: Poor Standing balance comment: Used of Vital go tilt bed    Special needs/care consideration BiPAP/CPAP no CPM no Continuous Drip IV no Dialysis no        Days n/a Life Vest no Oxygen room air Special Bed VitalGo Bed Trach Size no Wound Vac (area) no      Location n/a Skin abrasions to BLEs, ecchymosis to RUE, MASD to going and buttocks, skin tear to buttocks    Bowel mgmt: continent, last BM 11/24/2018 Bladder mgmt:  catheter Diabetic mgmt no Behavioral consideration no Chemo/radiation no     Previous Home Environment (from acute therapy documentation) Living Arrangements: Spouse/significant other  Lives With: Spouse, Son Available Help at Discharge: Family, Available 24 hours/day Type of Home: House Home Layout: Multi-level, Able to live on main level with bedroom/bathroom Alternate Level Stairs-Rails: Right Alternate Level Stairs-Number of Steps: 5 Home Access: Stairs to enter Entrance Stairs-Rails: Right Entrance Stairs-Number of Steps: 5 Bathroom Shower/Tub: Engineer, manufacturing systems: Handicapped height Bathroom Accessibility: Yes How Accessible: Accessible via wheelchair, Accessible via walker Home Care Services: No  Discharge Living Setting Plans for Discharge Living Setting: Patient's home Type of Home at Discharge: House Discharge Home Layout: One level Discharge Home Access: Stairs to enter Entrance Stairs-Rails: Right Entrance Stairs-Number of Steps: 5 Discharge Bathroom Shower/Tub: Tub/shower unit Discharge Bathroom Toilet: Standard Discharge Bathroom Accessibility: Yes How Accessible: Accessible via walker, Accessible via wheelchair Does the patient have any problems obtaining your medications?: No  Social/Family/Support Systems Patient Roles: Spouse, Parent Anticipated Caregiver: [name] and adult sons Anticipated Caregiver's Contact Information: [phone] Ability/Limitations of Caregiver: Elita Quick is a paramedic who works nights but will have son's and other family members assist Caregiver Availability: 24/7 Discharge Plan Discussed with Primary Caregiver: Yes Is Caregiver In Agreement with Plan?: Yes Does Caregiver/Family have Issues with Lodging/Transportation while Pt is in Rehab?: No   Goals/Additional Needs Patient/Family Goal for Rehab: PT/OT supervision-min assist Expected length of stay: 17-21 days Dietary Needs: regular thin Equipment Needs:  tbd Pt/Family Agrees to Admission and willing to participate: Yes Program Orientation Provided & Reviewed with Pt/Caregiver Including Roles  & Responsibilities: Yes Additional Information Needs: managing hypotension without overloading heart has been delicate balance on acute.  Will likely need to trial some combo of aces/teds/abdominal binder/TIS wheelchair to optimize pt's ability to participate    Possible need for SNF placement upon discharge: not anticipated   Patient Condition: This patient's medical and functional status has changed since the consult dated: 10/14/2018 in which the Rehabilitation Physician determined and documented that the patient's condition is appropriate for intensive rehabilitative care in an inpatient rehabilitation facility. See "History of Present Illness" (above) for medical update. Functional changes are: min assist for bed mobility, transferring with Sara+ to chair. Patient's medical and functional status update has been discussed with the Rehabilitation physician and patient remains appropriate for inpatient rehabilitation. Will admit to inpatient rehab today.  Preadmission Screen Completed By:  Stephania Fragmin, PT, 11/25/2018 10:37 AM ______________________________________________________________________   Discussed status with Dr. Allena Katz on 11/25/18 at 10:37 AM and received approval for admission today.  Admission Coordinator:  Stephania Fragmin, time 10:37 AM Dorna Bloom 11/25/18          Cosigned by: Marcello Fennel, MD at 11/25/2018 10:53 AM  Revision History

## 2018-11-25 NOTE — Discharge Summary (Signed)
Physician Discharge Summary  Javiel Clift OVF:[id number] DOB: 11-30-1965 DOA: 11/12/2018  PCP: [facility]  Admit date: 11/12/2018 Discharge date: 11/25/2018  Admitted From: Home Disposition: Inpatient rehab  Recommendations for Outpatient Follow-up:  1. Follow up with PCP in 1-2 weeks 2. Please obtain BMP/CBC in one week 3. Follow-up with Dr. Janne Napoleon cardiology Home Health none  equipment/Devices none Discharge Condition:stable improved CODE STATUS full code diet recommendation: Cardiac Brief/Interim Summary:53 year old male with no significant past medical history until her recent prolonged hospitalization from 09/30/20/9/20 for new onset ischemic CHF (ICM), three-vessel CAD (not a candidate for CABG per CTS), new onset A. Fib, spontaneous right thigh hematoma and GI bleed. His hospital course was complicated by a dynamic ileus/SBO requiring NG tube. He was discharged to CIR, and returns with progressive weakness and dyspnea over the last 2 to 3 days.  He had CXR that was concerning for hydro-versus hemothorax for which he was admitted. CT chest was obtained and concerning for empyema. Cardiothoracic surgery consulted by admitting provider and recommended pigtail placement by IR for potential thrombolytics in few days. He was also started on Vanco and Zosyn for possible HAP/parapneumonic effusion.  Patient had chest tube placed later the day of admission.Also has had thrombolytics. Chest tube drained about 4 L of purulent material since then. Culture grew pansensitive E. Coli. Transitioned to ampicillin on 11/15/2018.  Patient stable. Chest tube remains in place. CT surgery and IR following. Transfer to cardiac telemetry.  Discharge Diagnoses:  Active Problems:   Acute systolic CHF (congestive heart failure) (HCC)   Acute hypoxemic respiratory failure (HCC)   Morbid obesity (HCC)   Atrial fibrillation with rapid ventricular  response (HCC)   Hypokalemia   Protein-calorie malnutrition, severe   Hematoma of right thigh   Acute blood loss anemia   Orthostatic hypotension   Empyema (HCC)   Pleural effusion  Right Sided Empyema  Dyspnea/fatigue  - due to pleural effusion/parapneumonic effusion-improving. -Status post chest tube placement by IR on 4/16 and thrombolytics.  -No fluid cytology or chemistry sent.  -Culture grew pansensitive E. Coli.Blood cultures NGTD -Vancomycin and Zosyn for HAP-4/16-4/19 -Continued onIV ampicillin 4/19--today is the 10th day continue for another 18 days.  Patient was followed by CT surgery and interventional radiology. -Chest tube removed 11/23/2018.  Chronic systolic CHF/ICM: Echo with EF of 15%. Appears euvolemic.No cardiopulmonary symptomsat this time -Continue home Lasix and ARB-good urine output.  History of three-vessel CAD:not a candidate for CABG per CTSduring previous hospitalization-stable. -Continue home medications (low-dose Coreg and Crestor)as tolerated  History of blood loss anemia/GI bleed: Hb this morning7.6-Continue with PPI as tolerated. Stable for now -feraheme4/26 Startedpo iron  Paroxysmal A. fib without RVR -Continue home Eliquis, amiodarone, and Coreg - Per cards, amiodarone reduced to 200 mg daily  Bradycardia: - Pt currently on carvedilolamiodarone heart rate intermittently dipping into the 30s overnight. Will decrease the dose of Coreg. Digoxin has been stopped. Continue amiodarone and monitor.No further bradycardia overnight.  Elevated liver enzymes:Resolved  Protein calorie malnutrition: BMI 36. Due to chronic illness. -Appreciate dietitian input-continue supplements.    Pressure Injury 11/03/18 Stage II -  Partial thickness loss of dermis presenting as a shallow open ulcer with a red, pink wound bed without slough. (Active)  11/03/18 1500  Location: Buttocks  Location Orientation: Right  Staging: Stage  II -  Partial thickness loss of dermis presenting as a shallow open ulcer with a red, pink wound bed without slough.  Wound Description (Comments):  Present on Admission: No     Pressure Injury 11/05/18 Stage II -  Partial thickness loss of dermis presenting as a shallow open ulcer with a red, pink wound bed without slough. Scabbed pressure injury to right nare from NG tube (Active)  11/05/18 1758  Location: Nare  Location Orientation: Right;Medial  Staging: Stage II -  Partial thickness loss of dermis presenting as a shallow open ulcer with a red, pink wound bed without slough.  Wound Description (Comments): Scabbed pressure injury to right nare from NG tube  Present on Admission: Yes      Nutrition Problem: Increased nutrient needs Etiology: acute illness    Signs/Symptoms: estimated needs     Interventions: Ensure Enlive (each supplement provides 350kcal and 20 grams of protein)  Estimated body mass index is 32.01 kg/m as calculated from the following:   Height as of this encounter: 6\' 4"  (1.93 m).   Weight as of this encounter: 119.3 kg.  Discharge Instructions  Discharge Instructions    Diet - low sodium heart healthy   Complete by:  As directed    Increase activity slowly   Complete by:  As directed      Allergies as of 11/25/2018   No Known Allergies     Medication List    STOP taking these medications   atorvastatin 80 MG tablet Commonly known as:  Lipitor   digoxin 0.125 MG tablet Commonly known as:  LANOXIN   pantoprazole 40 MG tablet Commonly known as:  Protonix     TAKE these medications   acetaminophen 325 MG tablet Commonly known as:  TYLENOL Take 2 tablets (650 mg total) by mouth every 4 (four) hours as needed for headache or mild pain.   alum & mag hydroxide-simeth 200-200-20 MG/5ML suspension Commonly known as:  MAALOX/MYLANTA Take 30 mLs by mouth every 4 (four) hours as needed for indigestion or heartburn.   amiodarone 200 MG  tablet Commonly known as:  PACERONE Take 1 tablet (200 mg total) by mouth daily. What changed:  when to take this   ampicillin 2 g in sodium chloride 0.9 % 100 mL Inject 2 g into the vein every 6 (six) hours.   apixaban 5 MG Tabs tablet Commonly known as:  ELIQUIS Take 1 tablet (5 mg total) by mouth 2 (two) times daily.   carvedilol 3.125 MG tablet Commonly known as:  COREG Take 1 tablet (3.125 mg total) by mouth daily. What changed:  when to take this   ferrous sulfate 325 (65 FE) MG tablet Take 1 tablet (325 mg total) by mouth 3 (three) times daily with meals.   furosemide 40 MG tablet Commonly known as:  LASIX Take 1 tablet (40 mg total) by mouth daily for 30 days.   losartan 25 MG tablet Commonly known as:  COZAAR Take 1 tablet (25 mg total) by mouth daily.   nicotine 14 mg/24hr patch Commonly known as:  NICODERM CQ - dosed in mg/24 hours Place 1 patch (14 mg total) onto the skin daily.   polyethylene glycol 17 g packet Commonly known as:  MIRALAX / GLYCOLAX Take 17 g by mouth 2 (two) times daily.   rosuvastatin 20 MG tablet Commonly known as:  CRESTOR Take 1 tablet (20 mg total) by mouth daily at 6 PM for 30 days.      Follow-up Information    [facility] Follow up.   Specialty:  Family Medicine Contact information: [address] [facility]  [place] [address] [phone]        Jodelle Red, MD .   Specialty:  Cardiology Contact information: [address] [place] [address] [place] [address] [phone]          No Known Allergies  Consultations: Cardiothoracic surgery, interventional radiology  Procedures/Studies: Dg Chest 2 View  Result Date: 11/18/2018 CLINICAL DATA:  RIGHT chest tube in place for empyema. EXAM: CHEST - 2 VIEW COMPARISON:  11/17/2018 and earlier. FINDINGS: AP SEMI-ERECT and LATERAL images were obtained. Cardiac silhouette moderately enlarged, unchanged. Pulmonary vascularity normal  without evidence of pulmonary edema. Pigtail catheter in the RIGHT pleural space with residual small to moderate-sized RIGHT pleural effusion/empyema, unchanged over the past 4-5 days. Consolidation in the RIGHT LOWER LOBE, RIGHT MIDDLE LOBE and LEFT LOWER LOBE, also unchanged. No new pulmonary parenchymal abnormalities. IMPRESSION: 1. Stable small to moderate-sized RIGHT pleural effusion/empyema. 2. Stable atelectasis and/or pneumonia involving the RIGHT LOWER LOBE, RIGHT MIDDLE LOBE and LEFT LOWER LOBE. 3. Stable cardiomegaly without pulmonary edema. 4. No new abnormalities. Electronically Signed   By: Hulan Saas M.D.   On: 11/18/2018 08:19   Dg Chest 2 View  Addendum Date: 11/12/2018   ADDENDUM REPORT: 11/12/2018 10:01 ADDENDUM: Critical Value/emergent results were called by telephone at the time of interpretation on 11/12/2018 at 9:56 a.m. to Dr. Claudette Laws , who verbally acknowledged these results. Electronically Signed   By: Elberta Fortis M.D.   On: 11/12/2018 10:01   Result Date: 11/12/2018 CLINICAL DATA:  Recent cough.  CHF. EXAM: CHEST - 2 VIEW COMPARISON:  10/01/2018 FINDINGS: Left-sided PICC line has tip over the SVC at the level of the cavoatrial junction. Left lung is clear. There is near complete opacification of the right lung with air-fluid level over the upper lung/apex compatible with large hydro versus hemothorax. Moderate cardiomegaly unchanged. Remainder of the exam is unchanged. IMPRESSION: Large right hydro versus hemothorax. Left-sided PICC line unchanged. Electronically Signed: By: Elberta Fortis M.D. On: 11/12/2018 09:42   Dg Abd 1 View  Result Date: 10/27/2018 CLINICAL DATA:  Ileus.  Morbid obesity. EXAM: ABDOMEN - 1 VIEW COMPARISON:  10/26/2018 FINDINGS: Nasogastric tube is again seen with tip overlying the distal stomach. Diffuse dilatation of the colon shows no significant change, most likely due to chronic ileus. IMPRESSION: Probable colonic ileus, without  significant change. Electronically Signed   By: Myles Rosenthal M.D.   On: 10/27/2018 19:19   Ct Chest Wo Contrast  Result Date: 11/12/2018 CLINICAL DATA:  52 year old male with a history of pleural effusion EXAM: CT CHEST WITHOUT CONTRAST TECHNIQUE: Multidetector CT imaging of the chest was performed following the standard protocol without IV contrast. COMPARISON:  Chest x-ray 11/12/2018, 10/01/2018, abdomen CT 10/19/2018, abdomen CT 10/01/2018 FINDINGS: Cardiovascular: Cardiomegaly. Differential attenuation of the blood pool compatible with anemia. Advanced coronary calcifications of left main, left anterior descending, circumflex, right coronary artery. Normal course caliber and contour of the thoracic aorta. Diameter of the main pulmonary artery unremarkable. Mediastinum/Nodes: Multiple mediastinal lymph nodes, likely reactive. Paratracheal node measures 13 mm. Lymph nodes of the posterior right mediastinum, including index node on image 92 of series 3 measuring 11 mm. Unremarkable course of the thoracic esophagus. Lungs/Pleura: Fluid and gas collection of the right pleural space contributes to complete volume loss/atelectasis of the right lower lobe, complete atelectasis of the posterior segment of the right upper lobe, complete atelectasis of the lateral segment of the middle low. Evidence of pleural thickening, though not completely characterized given the absence of contrast.  Necrotic changes of the right lower lobe which were present on the CT of 10/19/2018, incompletely characterized given the absence of contrast. Right-sided fluid measures 17 Hounsfield units. Mild paraseptal emphysema at the left lung apex. Atelectasis of the left lower lobe lateral segment. No left-sided pleural effusion. No confluent airspace disease. No edema. Upper Abdomen: No acute finding of the upper abdomen. Musculoskeletal: No acute displaced fracture. Degenerative changes of the spine. Left upper extremity PICC appears to  terminate superior vena cava. IMPRESSION: Right-sided empyema, with complete collapse of the right lower lobe, and partial collapse of the right middle lobe and right upper lobe. Reactive mediastinal lymph nodes. These results were discussed by telephone at the time of interpretation on 11/12/2018 at 1:33 pm with Dr. Rinaldo Cloud LOVE. Cardiomegaly and evidence of anemia. Associated left main and 3 vessel coronary artery disease. Electronically Signed   By: Gilmer Mor D.O.   On: 11/12/2018 13:36   Dg Chest 1v Repeat Same Day  Result Date: 11/23/2018 CLINICAL DATA:  History of right empyema drainage. Status post chest tube removal EXAM: CHEST - 1 VIEW SAME DAY COMPARISON:  11/23/2018 FINDINGS: Small stable right residual empyema and basilar airspace disease. No pneumothorax. Left lung is clear. Stable cardiomegaly. No acute osseous abnormality. Left-sided PICC line with the tip projecting over the SVC. IMPRESSION: 1. Small stable right residual empyema and basilar airspace disease. No pneumothorax. Electronically Signed   By: Elige Ko   On: 11/23/2018 11:54   Dg Chest Port 1 View  Result Date: 11/24/2018 CLINICAL DATA:  Pleural effusion.  Chest tube removed yesterday. EXAM: PORTABLE CHEST 1 VIEW COMPARISON:  November 23, 2018 FINDINGS: Persistent right effusion with underlying opacity, stable in the interval. Minimal opacity in the right mid lung is stable as well. No pneumothorax. The left PICC line is in good position terminating in the central SVC. The left lung remains clear. Stable cardiomegaly. The hila and mediastinum are unremarkable. IMPRESSION: 1. The right-sided pleural effusion with underlying opacity and the mild opacity in the right mid lung are stable. 2. The left PICC line is in good position. 3. No changes. Electronically Signed   By: Gerome Sam III M.D   On: 11/24/2018 08:43   Dg Chest Port 1 View  Result Date: 11/23/2018 CLINICAL DATA:  Empyema EXAM: PORTABLE CHEST 1 VIEW COMPARISON:   11/21/2018 FINDINGS: Stable left arm PICC and right chest tube. No pneumothorax. Loculated right pleural fluid is stable. Heterogeneous opacities at the right lung base are stable. Cardiac silhouette remains prominent likely due to AP technique. IMPRESSION: Stable right empyema and chest tube without pneumothorax. Stable right basilar pulmonary opacities. Electronically Signed   By: Jolaine Click M.D.   On: 11/23/2018 08:03   Dg Chest Port 1 View  Result Date: 11/21/2018 CLINICAL DATA:  Right chest tube evaluation. EXAM: PORTABLE CHEST 1 VIEW COMPARISON:  November 20, 2018 FINDINGS: The right chest tube is stable. Right pleural fluid and associated right basilar opacity are stable. The left PICC line terminates in the central SVC. Stable cardiomegaly. The hila and mediastinum are unchanged. Visualized left lung is unremarkable. The lateral left lung base was not included on today's study. IMPRESSION: 1. Support apparatus as above. 2. Right pleural effusion and associated right basilar opacity are stable. 3. No other interval changes. The lateral left lung base was not included on today's study. Electronically Signed   By: Gerome Sam III M.D   On: 11/21/2018 07:01   Dg Chest Port 1 [address]  Result Date: 11/20/2018 CLINICAL DATA:  Follow-up empyema EXAM: PORTABLE CHEST 1 VIEW COMPARISON:  11/19/2018 FINDINGS: Cardiac shadow is enlarged but stable. Left PICC line is again seen and stable. Pigtail catheter is again noted in the right pleural space. Pleural thickening is again noted laterally with mild right basilar atelectasis. No bony abnormality is noted. IMPRESSION: Stable appearance of the chest when compared with the prior exam. Electronically Signed   By: Alcide Clever M.D.   On: 11/20/2018 08:06   Dg Chest Port 1 View  Result Date: 11/19/2018 CLINICAL DATA:  Pneumonia, empyema and status post right pigtail chest tube placement. EXAM: PORTABLE CHEST 1 VIEW COMPARISON:  11/18/2018 FINDINGS: Stable cardiac  enlargement. Stable positioning of right-sided pigtail chest tube. Stable residual small amount right-sided pleural fluid. Stable dense atelectasis and consolidation of the right lower lung. No pneumothorax. Stable positioning of left-sided PICC line with the catheter tip in the distal SVC. IMPRESSION: Stable chest x-ray demonstrating small amount of residual right pleural fluid and dense consolidation of the right lower lung. Electronically Signed   By: Irish Lack M.D.   On: 11/19/2018 07:47   Dg Chest Port 1 View  Result Date: 11/18/2018 CLINICAL DATA:  Air leak EXAM: PORTABLE CHEST 1 VIEW COMPARISON:  11/18/2018, 11/17/2018, 11/12/2018 FINDINGS: Left upper extremity catheter tip overlies the cavoatrial region. Similar positioning of right-sided chest tube. Similar residual right pleural thickening. Increased peripheral lucency, which may reflect air in the pleural space. Consolidation at the right base. Enlarged cardiomediastinal silhouette. IMPRESSION: 1. Similar appearance of right pleural effusion or thickening and right basilar infiltrates. 2. There may be slight increased peripheral lucency in the right thorax which may reflect increased air in the pleural space. Electronically Signed   By: Jasmine Pang M.D.   On: 11/18/2018 18:34   Dg Chest Port 1 View  Result Date: 11/17/2018 CLINICAL DATA:  53 year old male with a history of chest tube placement EXAM: PORTABLE CHEST 1 VIEW COMPARISON:  11/16/2018 FINDINGS: Cardiomediastinal silhouette unchanged. Opacity at the right lung is similar with pleuroparenchymal thickening, airspace opacity at the right lung base, partial obscuration the right hemidiaphragm. Unchanged left upper extremity PICC, appearing to terminate superior vena cava. Unchanged right thoracostomy pigtail drainage catheter. No pneumothorax. IMPRESSION: Unchanged right-sided thoracostomy tube, with persistent pleuroparenchymal thickening and right basilar opacity, compatible with  residual pleural fluid/thickening and basilar atelectasis/consolidation. Unchanged left upper extremity PICC Electronically Signed   By: Gilmer Mor D.O.   On: 11/17/2018 08:35   Dg Chest Port 1 View  Result Date: 11/16/2018 CLINICAL DATA:  Follow-up right pleural effusion EXAM: PORTABLE CHEST 1 VIEW COMPARISON:  11/15/2018 FINDINGS: Pigtail catheter is again noted on the right stable in appearance. Cardiac shadow remains enlarged. Left PICC line is again noted. Left lung is clear. Residual small pleural effusion on the right is noted. No significant change from the prior exam is noted. IMPRESSION: No change from the previous exam. Electronically Signed   By: Alcide Clever M.D.   On: 11/16/2018 08:10   Dg Chest Port 1 View  Result Date: 11/15/2018 CLINICAL DATA:  Evaluate for pneumothorax EXAM: PORTABLE CHEST 1 VIEW COMPARISON:  November 14, 2018 FINDINGS: A right chest tube remains in stable position. No pneumothorax. A left PICC line terminates in the SVC. Probable atelectasis in the incompletely evaluated left base. A right-sided pleural effusion with underlying opacity is not significantly changed in the interval. IMPRESSION: 1. No significant interval change. 2. Stable right chest tube with no pneumothorax. 3.  Right pleural effusion with underlying opacity is similar in the interval. Electronically Signed   By: Gerome Sam III M.D   On: 11/15/2018 08:40   Dg Chest Port 1 View  Result Date: 11/14/2018 CLINICAL DATA:  53 year old male with right-sided chest tube in place and a history of right empyema EXAM: PORTABLE CHEST 1 VIEW COMPARISON:  Prior chest x-ray 11/13/2018 FINDINGS: Right-sided pigtail thoracostomy tube remains in unchanged position. A left upper extremity PICC is present. The tip is in good position overlying the superior cavoatrial junction. Stable cardiomegaly. Mediastinal contours remain unchanged. Similar degree of left basilar airspace opacity favored to reflect atelectasis.  Right lung hypoventilation with pleural thickening versus residual pleural fluid appears unchanged. No significant interval change in patchy airspace opacities in the right lower lobe likely representing persistent right lower lobe atelectasis. IMPRESSION: 1. No significant interval change in the appearance of the chest over the last 24 hours. 2. Stable and satisfactory support apparatus. 3. Persistent right-sided pleural thickening and right lower lobe atelectasis. 4. Persistent nonspecific left basilar airspace opacity also favored to reflect atelectasis. Electronically Signed   By: Malachy Moan M.D.   On: 11/14/2018 08:24   Dg Chest Port 1 View  Result Date: 11/13/2018 CLINICAL DATA:  Shortness of breath, empyema. EXAM: PORTABLE CHEST 1 VIEW COMPARISON:  Radiographs of November 12, 2018. FINDINGS: Stable cardiomegaly. Left-sided PICC line is unchanged in position. Interval placement of pigtail drainage catheter into right pleural space. Right pleural effusion noted on prior exam is significantly decreased in size compared to prior exam. No definite pneumothorax is noted. Left lung is unremarkable. Bony thorax is unremarkable. IMPRESSION: Interval placement of pigtail drainage catheter right pleural space. Right pleural effusion noted on prior exam is significantly decreased in size compared to prior exam. Electronically Signed   By: Lupita Raider M.D.   On: 11/13/2018 09:25   Dg Abd Portable 1v  Result Date: 10/30/2018 CLINICAL DATA:  Ileus, NG tube EXAM: PORTABLE ABDOMEN - 1 VIEW COMPARISON:  10/27/2018 FINDINGS: NG tube remains in stable position. Gaseous distention of bowel again noted, unchanged. This likely reflects ileus as large and small bowel are involved. No free air or organomegaly. IMPRESSION: Continued diffuse gaseous distention of bowel, likely ileus. No change. Electronically Signed   By: Charlett Nose M.D.   On: 10/30/2018 07:37   Ir Perc Pleural Drain W/indwell Cath W/img  Guide  Result Date: 11/12/2018 INDICATION: 53 year old with large right pleural effusion containing gas. Findings are compatible with empyema. Plan for placement of image guided chest tube. EXAM: PLACEMENT OF RIGHT CHEST TUBE WITH ULTRASOUND AND FLUOROSCOPIC GUIDANCE. MEDICATIONS: No antibiotics given for this procedure. ANESTHESIA/SEDATION: 25 mcg fentanyl COMPLICATIONS: None immediate. PROCEDURE: Informed written consent was obtained from the patient after a thorough discussion of the procedural risks, benefits and alternatives. All questions were addressed. Maximal Sterile Barrier Technique was utilized including caps, mask, sterile gowns, sterile gloves, sterile drape, hand hygiene and skin antiseptic. A timeout was performed prior to the initiation of the procedure. Patient was placed supine on the interventional table. Right mid axillary region was prepped and draped in sterile fashion. Ultrasound demonstrated a large amount of complex right pleural fluid. Skin was anesthetized with 1% lidocaine. Yueh catheter was directed into the pleural space with ultrasound guidance. Thick brown/yellow purulent fluid was aspirated. Stiff Amplatz wire was advanced into the pleural space and the tract was dilated to accommodate a 14 [place] multipurpose drain. Drain was easily advanced into the pleural space. Greater  than 800 mL of thick purulent fluid was removed. Specimen sent for culture. Catheter was attached to a PleurEvac. FINDINGS: Large complex right pleural effusion. Thick purulent fluid was removed and compatible with an empyema. IMPRESSION: Successful placement of an image guided right chest tube. Electronically Signed   By: Richarda Overlie M.D.   On: 11/12/2018 18:03    (Echo, Carotid, EGD, Colonoscopy, ERCP)    Subjective: Resting in bed anxious to be going to rehab denies any shortness of breath or chest pain  Discharge Exam: Vitals:   11/25/18 0332 11/25/18 0800  BP: 104/70 94/75  Pulse: 66 89  Resp:  (!) 27 20  Temp: 97.7 F (36.5 C) (!) 97.5 F (36.4 C)  SpO2: 98% 95%   Vitals:   11/24/18 0908 11/24/18 1436 11/25/18 0332 11/25/18 0800  BP:  90/63 104/70 94/75  Pulse: 75 85 66 89  Resp:  (!) 21 (!) 27 20  Temp:  97.8 F (36.6 C) 97.7 F (36.5 C) (!) 97.5 F (36.4 C)  TempSrc:  Oral Oral Axillary  SpO2:  97% 98% 95%  Weight:   119.3 kg   Height:        General: Pt is alert, awake, not in acute distress Cardiovascular: RRR, S1/S2 +, no rubs, no gallops Respiratory: CTA bilaterally, no wheezing, no rhonchi Abdominal: Soft, NT, ND, bowel sounds + Extremities: no edema, no cyanosis    The results of significant diagnostics from this hospitalization (including imaging, microbiology, ancillary and laboratory) are listed below for reference.     Microbiology: No results found for this or any previous visit (from the past 240 hour(s)).   Labs: BNP (last 3 results) Recent Labs    10/01/18 1337 10/01/18 1720  BNP 2,769.2* 2,333.1*   Basic Metabolic Panel: Recent Labs  Lab 11/19/18 0954 11/20/18 0507 11/20/18 1032 11/22/18 0323 11/24/18 0459 11/24/18 1300  NA 138 146*  --  138  --  138  K 3.8 3.6  --  3.8  --  3.6  CL 104 113*  --  105  --  101  CO2 26 23  --  26  --  26  GLUCOSE 122* 75  --  93  --  92  BUN 18 14  --  18  --  17  CREATININE 0.98 0.75  --  0.99  --  0.95  CALCIUM 8.2* 5.7* 8.4* 8.2*  --  8.5*  MG  --  1.5*  --   --  1.8  --    Liver Function Tests: Recent Labs  Lab 11/20/18 0507 11/20/18 1032 11/24/18 1300  AST 22  --  20  ALT 30  --  25  ALKPHOS 65  --  89  BILITOT 0.8  --  0.9  PROT 4.3*  --  6.0*  ALBUMIN 1.4* 1.8* 2.0*   No results for input(s): LIPASE, AMYLASE in the last 168 hours. No results for input(s): AMMONIA in the last 168 hours. CBC: Recent Labs  Lab 11/19/18 0954 11/20/18 0507 11/20/18 1032 11/21/18 0348 11/22/18 0323 11/24/18 1300  WBC 10.7* 8.6  --   --  9.0 8.8  NEUTROABS  --   --   --   --  6.7 6.8   HGB 7.5* 6.4* 7.7* 7.5* 7.6* 7.6*  HCT 24.8* 21.2* 25.4* 24.3* 24.9* 24.1*  MCV 95.0 94.6  --   --  95.4 94.9  PLT 276 212  --   --  241 225   Cardiac Enzymes:  No results for input(s): CKTOTAL, CKMB, CKMBINDEX, TROPONINI in the last 168 hours. BNP: Invalid input(s): POCBNP CBG: No results for input(s): GLUCAP in the last 168 hours. D-Dimer No results for input(s): DDIMER in the last 72 hours. Hgb A1c No results for input(s): HGBA1C in the last 72 hours. Lipid Profile No results for input(s): CHOL, HDL, LDLCALC, TRIG, CHOLHDL, LDLDIRECT in the last 72 hours. Thyroid function studies No results for input(s): TSH, T4TOTAL, T3FREE, THYROIDAB in the last 72 hours.  Invalid input(s): FREET3 Anemia work up No results for input(s): VITAMINB12, FOLATE, FERRITIN, TIBC, IRON, RETICCTPCT in the last 72 hours. Urinalysis    Component Value Date/Time   COLORURINE AMBER (A) 10/01/2018 1523   APPEARANCEUR HAZY (A) 10/01/2018 1523   LABSPEC 1.023 10/01/2018 1523   PHURINE 5.0 10/01/2018 1523   GLUCOSEU NEGATIVE 10/01/2018 1523   HGBUR SMALL (A) 10/01/2018 1523   BILIRUBINUR NEGATIVE 10/01/2018 1523   KETONESUR NEGATIVE 10/01/2018 1523   PROTEINUR NEGATIVE 10/01/2018 1523   NITRITE NEGATIVE 10/01/2018 1523   LEUKOCYTESUR NEGATIVE 10/01/2018 1523   Sepsis Labs Invalid input(s): PROCALCITONIN,  WBC,  LACTICIDVEN Microbiology No results found for this or any previous visit (from the past 240 hour(s)).   Time coordinating discharge: 34  minutes  SIGNED:   Alwyn Ren, MD  [facility] 11/25/2018, 10:41 AM  If 7PM-7AM, please contact night-coverage [email or web address] Password [email or web address]

## 2018-11-25 NOTE — Progress Notes (Signed)
Inpatient Rehab Admissions Coordinator:    I have spoken with Dr. Jerolyn Center and have approval to admit pt to CIR today.  I will let patient, CM, and RN know.   Estill Dooms, PT, DPT Admissions Coordinator [phone] 11/25/18  10:35 AM

## 2018-11-25 NOTE — Progress Notes (Signed)
Report called to [facility], RN in CIR.  Pending transfer.

## 2018-11-25 NOTE — H&P (Signed)
Physical Medicine and Rehabilitation Admission H&P    CC: Debility  HPI: Aaron [name] is a 53 year old male without significant medical history who was originally admitted on 10/01/2018 with 3-4 weeks history of progressive edema history taken from chart review and patient.  Patient had edema in bilateral lower extremities along with shortness of breath and difficulty with ambulation.  He was found to have fluid overload with anasarca, ascites, AKI, and abnormal LFTs. 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  He was not felt to be a good surgical candidate. Hospital course complicated by A fib with RVR but patient developed spontaneous right thigh hematoma 3/18 as well as GIB due to esophageal ulcers with gastritis and esophagitis on 3/22. He has also had issue with abdominal pain due to ileus Tx with  NGT, A fib with NSVT requiring amiodarone for rate control,  hyptension reated with NGT, acute systolic CHF as well as issues with hypotension requiring tilt table for standing. (See prior H&P for full details)  He was admitted to [facility] 11/05/2018 due to debility and required supplemental oxygen due to DOE. On 04/16, he reported productive cough as well as increase in SOB. CXR done revealing concerns of hydro or hemothorax.  CT chest ordered for work up and revealed right sided empyema with complete collapse of RLE and partial collapse of RML/RUL with reactive mediastinal lymph nodes.  CT surgery and TRH were consulted for assistance and he was discharged to acute hospital for monitoring and treatment. Dr. Dorris Fetch recommended VIR chest tube placement. He underwent drainage of 800 cc thick brown purulent appearing fluid on 4/16 with placement of PleurEvac and continued to have drainage.  He was started on Vanc/Zosyn and cultures positive for E coli therefore narrowed to ampicillin on 11/15/2018.  Chest tube placed to water seal due to air leak and kept in place due to ongoing drainage.  On 4/23, he developed  bradycardia with HR in 30's therefore digoxin discontinued and coreg decreased 4/26 due to ongoing issues. He was started on trial of clamping, received thrombolytics 11/22/2018 and chest tube removed on 4/27.  Follow up CXR with stable right residual empyema without pneumothorax. Dr. Dorris Fetch recommends "probably 4 weeks antibiotic regimen" to treat empyema. He had drop in H/H to 6.4/21.2 on 4/24 and received 1 units PRBCs.  Anemia of chronic disease supplemented with feraheme 4/26. Respiratory status improved but he continues to be severely deconditioned. He is able to tolerate Vital Tilt bed at 60 degrees of 10 minutes of standing.  He has been tolerating OOB for 2 hrs/day.  He was cleared to resume CIR course on 4/29.    Review of Systems  Constitutional: Positive for malaise/fatigue. Negative for chills and fever.  HENT: Negative for hearing loss.   Eyes: Negative for blurred vision and double vision.  Respiratory: Negative for cough and shortness of breath.   Cardiovascular: Negative for chest pain and palpitations.  Gastrointestinal: Negative for constipation, heartburn and nausea.  Genitourinary: Negative for dysuria and urgency.  Musculoskeletal: Negative for back pain, myalgias and neck pain.  Skin: Negative for itching and rash.  Neurological: Positive for sensory change (Right thigh still numb) and weakness. Negative for dizziness.  Psychiatric/Behavioral: The patient is not nervous/anxious and does not have insomnia.   All other systems reviewed and are negative.  Past Medical History:  Diagnosis Date   Acute systolic (congestive) heart failure (HCC) 10/2018   Atrial fibrillation, new onset (HCC) 10/2018  Coronary artery disease    Morbid obesity [facility](HCC)     Past Surgical History:  Procedure Laterality Date   CHEST TUBE INSERTION Right 11/16/2018   ESOPHAGOGASTRODUODENOSCOPY (EGD) WITH PROPOFOL N/A 10/18/2018   Procedure: ESOPHAGOGASTRODUODENOSCOPY (EGD) WITH PROPOFOL;   Surgeon: Kerin SalenKarki, Arya, MD;  [facility];  Service: Gastroenterology;  Laterality: N/A;   IR PERC PLEURAL DRAIN W/INDWELL CATH W/IMG GUIDE  11/12/2018   RIGHT/LEFT HEART CATH AND CORONARY ANGIOGRAPHY N/A 10/09/2018   Procedure: RIGHT/LEFT HEART CATH AND CORONARY ANGIOGRAPHY;  Surgeon: Iran OuchArida, Muhammad A, MD;  Location: [facility] INVASIVE CV LAB;  Service: Cardiovascular;  Laterality: N/A;    Family History  Problem Relation Age of Onset   Alcoholism Father    Rheumatic fever Mother    Diabetes Mother    Drug abuse Brother     Social History:  Married. 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 Wife is a paramedic and works nights. He reports that he has never smoked. He has quit using smokeless tobacco.  His smokeless tobacco use included snuff. He reports that he does not drink alcohol or use drugs.   Allergies: No Known Allergies    Medications Prior to Admission  Medication Sig Dispense Refill   acetaminophen (TYLENOL) 325 MG tablet Take 2 tablets (650 mg total) by mouth every 4 (four) hours as needed for headache or mild pain.     alum & mag hydroxide-simeth (MAALOX/MYLANTA) 200-200-20 MG/5ML suspension Take 30 mLs by mouth every 4 (four) hours as needed for indigestion or heartburn. 355 mL 0   amiodarone (PACERONE) 200 MG tablet Take 1 tablet (200 mg total) by mouth daily. 30 tablet 0   ampicillin 2 g in sodium chloride 0.9 % 100 mL Inject 2 g into the vein every 6 (six) hours. 18 Bag 0   apixaban (ELIQUIS) 5 MG TABS tablet Take 1 tablet (5 mg total) by mouth 2 (two) times daily. 60 tablet 0   carvedilol (COREG) 3.125 MG tablet Take 1 tablet (3.125 mg total) by mouth daily. 30 tablet 0   ferrous sulfate 325 (65 FE) MG tablet Take 1 tablet (325 mg total) by mouth 3 (three) times daily with meals.  3   furosemide (LASIX) 40 MG tablet Take 1 tablet (40 mg total) by mouth daily for 30 days. 30 tablet 0   losartan (COZAAR) 25 MG tablet Take 1  tablet (25 mg total) by mouth daily.     nicotine (NICODERM CQ - DOSED IN MG/24 HOURS) 14 mg/24hr patch Place 1 patch (14 mg total) onto the skin daily. 28 patch 0   pantoprazole (PROTONIX) 40 MG tablet Take 1 tablet (40 mg total) by mouth 2 (two) times daily before a meal.     polyethylene glycol (MIRALAX / GLYCOLAX) packet Take 17 g by mouth 2 (two) times daily. 14 each 0   rosuvastatin (CRESTOR) 20 MG tablet Take 1 tablet (20 mg total) by mouth daily at 6 PM for 30 days. 30 tablet 0    Drug Regimen Review  Drug regimen was reviewed and remains appropriate with no significant issues identified  Home: Home Living Family/patient expects to be discharged to:: Inpatient rehab Living Arrangements: Spouse/significant other Available Help at Discharge: Family, Available 24 hours/day Type of Home: House Home Access: Stairs to enter Entergy CorporationEntrance Stairs-Number of Steps: 5 Entrance Stairs-Rails: Right Home Layout: Multi-level, Able to live on main level with bedroom/bathroom Alternate Level Stairs-Number of Steps: 5 Alternate Level Stairs-Rails: Right  Bathroom Shower/Tub: Engineer, manufacturing systems: Handicapped height Bathroom Accessibility: Yes Home Equipment: Environmental consultant - 2 wheels, Bedside commode  Lives With: Spouse, Son   Functional History: Prior Function Level of Independence: Independent Comments: prior to hospitalization, has been in hospital since 3/5.  has progressed to using [place] Dec for transfers  Functional Status:  Mobility: Bed Mobility Overal bed mobility: Needs Assistance Bed Mobility: Supine to Sit Rolling: Min assist Supine to sit: Min assist Sit to supine: Mod assist, +2 for physical assistance General bed mobility comments: scooted toward L side of bed to align prior to using standing feature of bed with min assist to flex LEs Transfers Overall transfer level: Needs assistance Equipment used: Ambulation equipment used Transfer via Lift Equipment: (Vital Go tilt  bed) Transfers: Sit to/from Stand Sit to Stand: +2 physical assistance, Max assist, Total assist General transfer comment: Progressively inclined to stand at 60 degrees in Vital Go tilt bed.  BP was an issue today.  Initially 98/61.  Dropped to 72/59 at 50 degrees tilt,  reclined pt to 35 degrees and BP 88/62.  Then up to 60 degrees tilt with BP 103/66.  Stayed at 60 degree tilt for several min and BP 112/89.  Once pt back to sitting position at 45 degrees, BP 96/62.  Tolerated stand with adjustments for BP.  REhab admissions coordinator came in and may take pt to Rehab today.  Tolerated 10 min of stand.        ADL: ADL Overall ADL's : Needs assistance/impaired Grooming: Wash/dry hands, Wash/dry face, Oral care, Sitting, Minimal assistance Upper Body Bathing: Minimal assistance, Bed level Lower Body Bathing: Total assistance, Bed level Upper Body Dressing : Moderate assistance, Bed level Lower Body Dressing: Total assistance, Bed level Lower Body Dressing Details (indicate cue type and reason): brace Toileting- Clothing Manipulation and Hygiene: Total assistance, Bed level Functional mobility during ADLs: Total assistance, +2 for physical assistance(with sara plus) General ADL Comments: Pt participated in rolling in bed for placement of bed pan.  Total assist for back peri care following BM.  Pt participated in rolling left and right for placement of maxi lift pad in prep for transfer OOB.  Cognition: Cognition Overall Cognitive Status: Within Functional Limits for tasks assessed Orientation Level: Oriented X4 Cognition Arousal/Alertness: Awake/alert Behavior During Therapy: WFL for tasks assessed/performed Overall Cognitive Status: Within Functional Limits for tasks assessed General Comments: Pt nauseaous and not feeling well   Blood pressure 108/70, pulse 89, temperature 98.6 F (37 C), temperature source Oral, resp. rate (!) 0, height 6\' 4"  (1.93 m), weight 119.3 kg, SpO2 96  %. Physical Exam  Nursing note and vitals reviewed. Constitutional: He appears well-developed and well-nourished. No distress.  HENT:  Head: Normocephalic and atraumatic.  Eyes: EOM are normal. Right eye exhibits no discharge. Left eye exhibits no discharge.  Respiratory: Effort normal. No respiratory distress.  GI: He exhibits no distension.  Musculoskeletal:     Comments: BLE with unna boots, bilateral foot edema  Neurological: He is alert.  Motor: Bilateral upper extremities: 5/5 proximal distal Left lower extremity: 4/5 proximal distal Right lower extremity: Flexion, knee extension 2/5, ankle dorsiflexion 4/5  Skin: He is not diaphoretic.  Bilateral lower extremities with Unna boots  Psychiatric: He has a normal mood and affect. His behavior is normal.    Results for orders placed or performed during the hospital encounter of 11/12/18 (from the past 48 hour(s))  Magnesium     Status: None   Collection Time: 11/24/18  4:59 AM  Result Value Ref Range   Magnesium 1.8 1.7 - 2.4 mg/dL    Comment: Performed at [facility], [address], [place], [place] [address]  CBC with Differential/Platelet     Status: Abnormal   Collection Time: 11/24/18  1:00 PM  Result Value Ref Range   WBC 8.8 4.0 - 10.5 K/uL   RBC 2.54 (L) 4.22 - 5.81 MIL/uL   Hemoglobin 7.6 (L) 13.0 - 17.0 g/dL   HCT 60.4 (L) 54.0 - 98.1 %   MCV 94.9 80.0 - 100.0 fL   MCH 29.9 26.0 - 34.0 pg   MCHC 31.5 30.0 - 36.0 g/dL   RDW 19.1 (H) 47.8 - 29.5 %   Platelets 225 150 - 400 K/uL   nRBC 0.0 0.0 - 0.2 %   Neutrophils Relative % 77 %   Neutro Abs 6.8 1.7 - 7.7 K/uL   Lymphocytes Relative 12 %   Lymphs Abs 1.1 0.7 - 4.0 K/uL   Monocytes Relative 8 %   Monocytes Absolute 0.7 0.1 - 1.0 K/uL   Eosinophils Relative 1 %   Eosinophils Absolute 0.1 0.0 - 0.5 K/uL   Basophils Relative 1 %   Basophils Absolute 0.0 0.0 - 0.1 K/uL   WBC Morphology See Note     Comment: Mild Left Shift. 1 to 5% Metas and Myelos, Occ  Pro Noted.   Immature Granulocytes 1 %   Abs Immature Granulocytes 0.06 0.00 - 0.07 K/uL   Polychromasia PRESENT     Comment: Performed at [facility], [address] [place], [place] [address]  Comprehensive metabolic panel     Status: Abnormal   Collection Time: 11/24/18  1:00 PM  Result Value Ref Range   Sodium 138 135 - 145 mmol/L   Potassium 3.6 3.5 - 5.1 mmol/L   Chloride 101 98 - 111 mmol/L   CO2 26 22 - 32 mmol/L   Glucose, Bld 92 70 - 99 mg/dL   BUN 17 6 - 20 mg/dL   Creatinine, Ser 8.65 0.61 - 1.24 mg/dL   Calcium 8.5 (L) 8.9 - 10.3 mg/dL   Total Protein 6.0 (L) 6.5 - 8.1 g/dL   Albumin 2.0 (L) 3.5 - 5.0 g/dL   AST 20 15 - 41 U/L   ALT 25 0 - 44 U/L   Alkaline Phosphatase 89 38 - 126 U/L   Total Bilirubin 0.9 0.3 - 1.2 mg/dL   GFR calc non Af Amer >60 >60 mL/min   GFR calc Af Amer >60 >60 mL/min   Anion gap 11 5 - 15    Comment: Performed at [facility], [address] [place], [place] [address]   Dg Chest Port 1 View  Result Date: 11/24/2018 CLINICAL DATA:  Pleural effusion.  Chest tube removed yesterday. EXAM: PORTABLE CHEST 1 VIEW COMPARISON:  November 23, 2018 FINDINGS: Persistent right effusion with underlying opacity, stable in the interval. Minimal opacity in the right mid lung is stable as well. No pneumothorax. The left PICC line is in good position terminating in the central SVC. The left lung remains clear. Stable cardiomegaly. The hila and mediastinum are unremarkable. IMPRESSION: 1. The right-sided pleural effusion with underlying opacity and the mild opacity in the right mid lung are stable. 2. The left PICC line is in good position. 3. No changes. Electronically Signed   By: Gerome Sam III M.D   On: 11/24/2018 08:43    Medical Problem List and Plan:  1.  Deficits with mobility, transfers, endurance, weakness secondary to multifactorial debility.  Admit to CIR 2.  Antithrombotics: -DVT/anticoagulation:  Pharmaceutical: Other  (comment)--Eliquis. Continue to monitor hemoglobin as well as signs of bleeding.  -antiplatelet therapy: N/A 3. Pain Management: Tylenol prn 4. Mood: LCSW to follow for evaluate   -antipsychotic agents: N/A 5. Neuropsych: This patient is capable of making decisions on his own behalf. 6. Skin/Wound Care: Routine pressure relief measures.  7. Fluids/Electrolytes/Nutrition: Monitor I/O. Check BMP tomorrow a.m.  Continue ensure bid.  8. Empyema due to E coli: Continue ampicillin --Antibiotic day # 10/28.  CT chest prior to d/c antibiotics? 9. ABLA due to GIB/spontaneous thigh hematoma: Continue protonix bid. Received 1 unit PRBC 4/24 and feraheme 4/26.   Now on iron supplement. Add folic acid due to low folate levels.  CBC ordered for tomorrow a.m. 10. ICM/Acute systolic CHF: Monitor daily weights.  Monitor for signs/symptoms of fluid overload.  BP remains soft--no ARB. Continue lasix, coreg and Crestor.  11. 3 vessel CAD: Not a surgical candidate-treated medically 12. Paroxymal A Fib: Monitor HR bid-continue amiodarone, coreg and Eliquis. Cards recommends re-eval prior to discharge.  13. Bradycardia: Heart rate improving with decrease in Coreg and d/c of digoxin.  Monitor with increased physical activity. 14. Protein calorie malnutrition: Continue to offer supplements tid. Magnesium supplemented yesterday due to low normal levels. Will add Mg supplement..  15.Orthostatic hypotension: Keep HOB > 30 degrees. Continue Unna boots for edema control and BP support.  Abdominal binder if needed.  Post Admission Physician Evaluation: 1. Preadmission assessment reviewed and changes made below. 2. Functional deficits secondary  to multifactorial debility. 3. Patient is admitted to receive collaborative, interdisciplinary care between the physiatrist, rehab nursing staff, and therapy team. 4. Patient's level of medical complexity and substantial therapy needs in context of that medical necessity cannot be  provided at a lesser intensity of care such as a SNF. 5. Patient has experienced substantial functional loss from his/her baseline which was documented above under the "Functional History" and "Functional Status" headings.  Judging by the patient's diagnosis, physical exam, and functional history, the patient has potential for functional progress which will result in measurable gains while on inpatient rehab.  These gains will be of substantial and practical use upon discharge  in facilitating mobility and self-care at the household level. 6. Physiatrist will provide 24 hour management of medical needs as well as oversight of the therapy plan/treatment and provide guidance as appropriate regarding the interaction of the two. 7. 24 hour rehab nursing will assist with safety, skin/wound care, disease management and patient education  and help integrate therapy concepts, techniques,education, etc. 8. PT will assess and treat for/with: Lower extremity strength, range of motion, stamina, balance, functional mobility, safety, adaptive techniques and equipment, wound care, coping skills, pain control, education. Goals are: Min/mod a. 9. OT will assess and treat for/with: ADL's, functional mobility, safety, upper extremity strength, adaptive techniques and equipment, wound mgt, ego support, and community reintegration.   Goals are: Min/mod I. Therapy may proceed with showering this patient. 10. Case Management and Social Worker will assess and treat for psychological issues and discharge planning. 11. Team conference will be held weekly to assess progress toward goals and to determine barriers to discharge. 12. Patient will receive at least 3 hours of therapy per day at least 5 days per week. 13. ELOS: 17-21 days.       14. Prognosis:  good  I have personally performed a face to  face diagnostic evaluation, including, but not limited to relevant history and physical exam findings, of this patient and developed  relevant assessment and plan.  Additionally, I have reviewed and concur with the physician assistant's documentation above.  Maryla Morrow, MD, ABPMR Jacquelynn Cree, PA-C 11/25/2018

## 2018-11-25 NOTE — TOC Transition Note (Signed)
Transition of Care [facility]) - CM/SW Discharge Note Donn Pierini RN, BSN Transitions of Care Unit 4E- RN Case Manager [phone]  Patient Details  Name: Aaron [name] MRN: [id number] Date of Birth: 12/18/1965  Transition of [facility]) CM/SW Contact:  Darrold Span, RN Phone Number: 11/25/2018, 10:52 AM   Clinical Narrative:    Pt admitted with acute resp. Failure, afib, empyema, - now cleared for return to IP rehab- pt has bed available today to transition back to IP rehab here at [facility]. All post rehab needs to be addressed when pt ready to transition home from IP rehab   Final next level of care: IP Rehab Facility Barriers to Discharge: No Barriers Identified   Patient Goals and CMS Choice Patient states their goals for this hospitalization and ongoing recovery are:: "to go back to rehab" CMS Medicare.gov Compare Post Acute Care list provided to:: Patient Choice offered to / list presented to : Patient  Discharge Placement  Pt to return to [facility] IP rehab today                     Discharge Plan and Services In-house Referral: Clinical Social Work Discharge Planning Services: CM Consult Post Acute Care Choice: IP Rehab                               Social Determinants of Health (SDOH) Interventions     Readmission Risk Interventions Readmission Risk Prevention Plan 11/25/2018  Transportation Screening Complete  PCP or Specialist Appt within 3-5 Days Complete  HRI or Home Care Consult Complete  Social Work Consult for Recovery Care Planning/Counseling Complete  Palliative Care Screening Not Applicable  Medication Review Oceanographer) Complete  Some recent data might be hidden

## 2018-11-25 NOTE — Progress Notes (Signed)
Patient arrived and re-oriented to unit routine. No complaints or questions at this time.

## 2018-11-25 NOTE — PMR Pre-admission (Signed)
PMR Admission Coordinator Pre-Admission Assessment  Patient: Aaron [name] is an 53 y.o., male MRN: [id number] DOB: 04/21/1966 Height: 6\' 4"  (193 cm) Weight: 119.3 kg              Insurance Information HMO:     PPO:      PCP:      IPA:      80/20:      OTHER:  PRIMARY: Uninsured.  Follow up with Frances Maywood [id number]) for Medicaid and Disability application.   Medicaid Application Date: 10/16/2018      Case Manager: Amanda Cockayne [email or web address], [phone]) Disability Application Date:       Case Worker:   The "Data Collection Information Summary" for patients in Inpatient Rehabilitation Facilities with attached "Privacy Act Statement-Health Care Records" was provided and verbally reviewed with: N/A  Emergency Contact Information Contact Information    Name Relation Home Work Mobile   [facility] Spouse [phone]     [name], [name] [phone]       Current Medical History  Patient Admitting Diagnosis: Debility 2/2 congestive heart failure  History of Present Illness: Aaron [name] is a 53 year old male without significant medical history who was originally admitted on 10/01/18 with 3-4 weeks history of progressive edema  BLE with SOB difficulty walking. He was found to have fluid overload with anasarca, ascites, AKI, and abnormal LFTs. 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 Hospital course complicated by A fib with RVR but patient developed spontaneous right thigh hematoma 3/18 as well as GIB due to esophageal ulcers with gastritis and esophagitis on 3/22. He has also had issue with abdominal pain due to ileus Tx with  NGT, A fib with NSVT requiring amiodarone for rate control,  hyptension reated with NGT, acute systolic CHF as well as issues with hypotension requiring tilt table for standing. (See prior H&P for full details)  He was admitted to CIR 11/05/18 due to severe debility and has required supplemental oxygen  due to hypoxia and DOE with activity. On 04/16, he reported productive cough as well as increase in SOB. CXR done revealing concerns of hydro or hemothorax.  CT chest ordered for work up and revealed right sided empyema with complete collapse of RLE and partial collapse of RML/RUL with reactive mediastinal lymph nodes.  CT surgery and TRH were consulted for assistance and he was discharged to acute hospital for monitoring and treatment.   Dr. Dorris Fetch recommended CT placement by radiology. He underwent drainage of 800 cc thick brown purulent appearing fluid on 4/16 with placement of PleurEvac and continued to have drainage.  He was started on Vanc/Zosyn and cultures positive for E coli therefore narrowed to ampicillin on 4/19.  Chest tube placed to water seal due to air leak and kept in place due to ongoing drainage.  On 4/23, he developed bradycardia with HR in 30's therefore digoxin discontinued and coreg decreased 4/26 due to ongoing issues. He was started on trial of clamping, received thrombolytics 4/26 and chest tube removed on 4/27.  Follow up CXR with stable persistent right loculated effusion without pneumothorax. Dr. Dorris Fetch recommends "probably 4 weeks antibiotic regimen" to treat empyema. Anemia of chronic disease supplemented with feraheme 4/26.  Pt with ongoing issues with orthostasis, CIR team aware.      Glasgow Coma Scale Score: 15  Past Medical History  Past Medical History:  Diagnosis Date  . Acute systolic (congestive) heart failure (HCC) 10/2018  .  Atrial fibrillation, new onset (HCC) 10/2018  . Coronary artery disease   . Morbid obesity (HCC)     Family History  family history includes Alcoholism in his father; Diabetes in his mother; Drug abuse in his brother; Rheumatic fever in his mother.  Prior Rehab/Hospitalizations:  Has the patient had prior rehab or hospitalizations prior to admission? No and pt with long hospitalization from 10/01/2018 to present  Has the  patient had major surgery during 100 days prior to admission? No  Current Medications   Current Facility-Administered Medications:  .  0.9 %  sodium chloride infusion (Manually program via Guardrails IV Fluids), , Intravenous, Once, Zigmund Daniel., MD .  acetaminophen (TYLENOL) tablet 650 mg, 650 mg, Oral, Q4H PRN, Zigmund Daniel., MD, 650 mg at 11/13/18 0003 .  alum & mag hydroxide-simeth (MAALOX/MYLANTA) 200-200-20 MG/5ML suspension 30 mL, 30 mL, Oral, Q4H PRN, Zigmund Daniel., MD .  amiodarone (PACERONE) tablet 200 mg, 200 mg, Oral, Daily, Zigmund Daniel., MD, 200 mg at 11/24/18 0908 .  ampicillin (OMNIPEN) 2 g in sodium chloride 0.9 % 100 mL IVPB, 2 g, Intravenous, Q6H, Zigmund Daniel., MD, Last Rate: 300 mL/hr at 11/25/18 0336, 2 g at 11/25/18 0336 .  apixaban (ELIQUIS) tablet 5 mg, 5 mg, Oral, BID, Zigmund Daniel., MD, 5 mg at 11/24/18 2117 .  carvedilol (COREG) tablet 3.125 mg, 3.125 mg, Oral, Daily, Alwyn Ren, MD, 3.125 mg at 11/24/18 6004 .  feeding supplement (ENSURE ENLIVE) (ENSURE ENLIVE) liquid 237 mL, 237 mL, Oral, TID BM, Zigmund Daniel., MD, 237 mL at 11/25/18 0843 .  ferrous sulfate tablet 325 mg, 325 mg, Oral, TID WC, Alwyn Ren, MD, 325 mg at 11/25/18 0843 .  furosemide (LASIX) tablet 40 mg, 40 mg, Oral, Daily, Zigmund Daniel., MD, 40 mg at 11/24/18 0909 .  lidocaine (XYLOCAINE) 1 % (with pres) injection, , Infiltration, PRN, Richarda Overlie, MD, 15 mL at 11/12/18 1659 .  ondansetron (ZOFRAN) injection 4 mg, 4 mg, Intravenous, Q6H PRN, Zigmund Daniel., MD .  pantoprazole (PROTONIX) EC tablet 40 mg, 40 mg, Oral, BID AC, Zigmund Daniel., MD, 40 mg at 11/25/18 0843 .  polyethylene glycol (MIRALAX / GLYCOLAX) packet 17 g, 17 g, Oral, BID, Zigmund Daniel., MD, 17 g at 11/24/18 2117 .  rosuvastatin (CRESTOR) tablet 20 mg, 20 mg, Oral, q1800, Zigmund Daniel., MD, 20 mg at 11/24/18  1746 .  sodium chloride flush (NS) 0.9 % injection 10-40 mL, 10-40 mL, Intracatheter, PRN, Zigmund Daniel., MD  Patients Current Diet:  Diet Order            Diet Heart Room service appropriate? Yes with Assist; Fluid consistency: Thin  Diet effective now              Precautions / Restrictions Precautions Precautions: Fall Precaution Comments: monitor BP, hypotensive in sitting/standing Other Brace: RLE Bledsoe brace locked in extension when ambulating that can be d/c'd when pt can perform 10 SLRs without assist Restrictions Weight Bearing Restrictions: No RLE Weight Bearing: Weight bearing as tolerated LLE Weight Bearing: Weight bearing as tolerated   Has the patient had 2 or more falls or a fall with injury in the past year?No  Prior Activity Level Community (5-7x/wk): pt with prolonged hospitalization (original admission date 10/01/2018), and 1 month PTA pt noticed steady decline in health  Prior Functional Level Prior Function  Level of Independence: Independent Comments: prior to hospitalization, has been in hospital since 3/5.  has progressed to using [place] Dec for transfers  Self Care: Did the patient need help bathing, dressing, using the toilet or eating?  Independent  Indoor Mobility: Did the patient need assistance with walking from room to room (with or without device)? Independent  Stairs: Did the patient need assistance with internal or external stairs (with or without device)? Independent  Functional Cognition: Did the patient need help planning regular tasks such as shopping or remembering to take medications? Independent  Home Assistive Devices / Equipment Home Assistive Devices/Equipment: None Home Equipment: Walker - 2 wheels, Bedside commode  Prior Device Use: Indicate devices/aids used by the patient prior to current illness, exacerbation or injury? None of the above  Current Functional Level Cognition  Overall Cognitive Status: Within Functional  Limits for tasks assessed Orientation Level: Oriented X4 General Comments: Pt nauseaous and not feeling well    Extremity Assessment (includes Sensation/Coordination)  Upper Extremity Assessment: Defer to OT evaluation  Lower Extremity Assessment: RLE deficits/detail, Generalized weakness RLE Deficits / Details: hx of R LE hematoma and placement of Bledsoe brace by orthopedist, ROM of hip and knee limited    ADLs  Overall ADL's : Needs assistance/impaired Grooming: Wash/dry hands, Wash/dry face, Oral care, Sitting, Minimal assistance Upper Body Bathing: Minimal assistance, Bed level Lower Body Bathing: Total assistance, Bed level Upper Body Dressing : Moderate assistance, Bed level Lower Body Dressing: Total assistance, Bed level Lower Body Dressing Details (indicate cue type and reason): brace Toileting- Clothing Manipulation and Hygiene: Total assistance, Bed level Functional mobility during ADLs: Total assistance, +2 for physical assistance(with sara plus) General ADL Comments: Pt participated in rolling in bed for placement of bed pan.  Total assist for back peri care following BM.  Pt participated in rolling left and right for placement of maxi lift pad in prep for transfer OOB.    Mobility  Overal bed mobility: Needs Assistance Bed Mobility: Supine to Sit Rolling: Min assist Supine to sit: Min assist Sit to supine: Mod assist, +2 for physical assistance General bed mobility comments: scooted toward L side of bed to align prior to using standing feature of bed with min assist to flex LEs    Transfers  Overall transfer level: Needs assistance Equipment used: Ambulation equipment used Transfer via Lift Equipment: (Vital Go tilt bed) Transfers: Sit to/from Stand Sit to Stand: +2 physical assistance, Max assist, Total assist General transfer comment: Progressively inclined to stand at 60 degrees in Vital Go tilt bed.  BP was an issue today.  Initially 98/61.  Dropped to 72/59 at  50 degrees tilt,  reclined pt to 35 degrees and BP 88/62.  Then up to 60 degrees tilt with BP 103/66.  Stayed at 60 degree tilt for several min and BP 112/89.  Once pt back to sitting position at 45 degrees, BP 96/62.  Tolerated stand with adjustments for BP.  REhab admissions coordinator came in and may take pt to Rehab today.  Tolerated 10 min of stand.      Ambulation / Gait / Stairs / Engineer, drilling / Balance Dynamic Sitting Balance Sitting balance - Comments: sat EOB with min guard assist  Balance Overall balance assessment: Needs assistance Sitting-balance support: Bilateral upper extremity supported Sitting balance-Leahy Scale: Fair Sitting balance - Comments: sat EOB with min guard assist  Standing balance support: No upper extremity supported, During functional activity Standing balance-Leahy  Scale: Poor Standing balance comment: Used of Vital go tilt bed    Special needs/care consideration BiPAP/CPAP no CPM no Continuous Drip IV no Dialysis no        Days n/a Life Vest no Oxygen room air Special Bed VitalGo Bed Trach Size no Wound Vac (area) no      Location n/a Skin abrasions to BLEs, ecchymosis to RUE, MASD to going and buttocks, skin tear to buttocks    Bowel mgmt: continent, last BM 11/24/2018 Bladder mgmt: catheter Diabetic mgmt no Behavioral consideration no Chemo/radiation no     Previous Home Environment (from acute therapy documentation) Living Arrangements: Spouse/significant other  Lives With: Spouse, Son Available Help at Discharge: Family, Available 24 hours/day Type of Home: House Home Layout: Multi-level, Able to live on main level with bedroom/bathroom Alternate Level Stairs-Rails: Right Alternate Level Stairs-Number of Steps: 5 Home Access: Stairs to enter Entrance Stairs-Rails: Right Entrance Stairs-Number of Steps: 5 Bathroom Shower/Tub: Engineer, manufacturing systems: Handicapped height Bathroom Accessibility: Yes How  Accessible: Accessible via wheelchair, Accessible via walker Home Care Services: No  Discharge Living Setting Plans for Discharge Living Setting: Patient's home Type of Home at Discharge: House Discharge Home Layout: One level Discharge Home Access: Stairs to enter Entrance Stairs-Rails: Right Entrance Stairs-Number of Steps: 5 Discharge Bathroom Shower/Tub: Tub/shower unit Discharge Bathroom Toilet: Standard Discharge Bathroom Accessibility: Yes How Accessible: Accessible via walker, Accessible via wheelchair Does the patient have any problems obtaining your medications?: No  Social/Family/Support Systems Patient Roles: Spouse, Parent Anticipated Caregiver: [name] and adult sons Anticipated Caregiver's Contact Information: [phone] Ability/Limitations of Caregiver: Elita Quick is a paramedic who works nights but will have son's and other family members assist Caregiver Availability: 24/7 Discharge Plan Discussed with Primary Caregiver: Yes Is Caregiver In Agreement with Plan?: Yes Does Caregiver/Family have Issues with Lodging/Transportation while Pt is in Rehab?: No   Goals/Additional Needs Patient/Family Goal for Rehab: PT/OT supervision-min assist Expected length of stay: 17-21 days Dietary Needs: regular thin Equipment Needs: tbd Pt/Family Agrees to Admission and willing to participate: Yes Program Orientation Provided & Reviewed with Pt/Caregiver Including Roles  & Responsibilities: Yes Additional Information Needs: managing hypotension without overloading heart has been delicate balance on acute. Will likely need to trial some combo of aces/teds/abdominal binder/TIS wheelchair to optimize pt's ability to participate    Possible need for SNF placement upon discharge: not anticipated   Patient Condition: This patient's medical and functional status has changed since the consult dated: 10/14/2018 in which the Rehabilitation Physician determined and documented that the patient's  condition is appropriate for intensive rehabilitative care in an inpatient rehabilitation facility. See "History of Present Illness" (above) for medical update. Functional changes are: min assist for bed mobility, transferring with Sara+ to chair. Patient's medical and functional status update has been discussed with the Rehabilitation physician and patient remains appropriate for inpatient rehabilitation. Will admit to inpatient rehab today.  Preadmission Screen Completed By:  Stephania Fragmin, PT, 11/25/2018 10:37 AM ______________________________________________________________________   Discussed status with Dr. Allena Katz on 11/25/18 at 10:37 AM and received approval for admission today.  Admission Coordinator:  Stephania Fragmin, time 10:37 AM Dorna Bloom 11/25/18

## 2018-11-25 NOTE — Progress Notes (Signed)
Jamse Arn, MD  Physician  Physical Medicine and Rehabilitation  Consult Note  Signed  Date of Service:  10/14/2018 10:21 AM       Related encounter: ED to Hosp-Admission (Discharged) from 10/01/2018 in [facility]      Signed      Expand All Collapse All    Show:Clear all [x]Manual[x]Template[]Copied  Added by: [x]Love, Ivan Anchors, PA-C[x]Patel, Domenick Bookbinder, MD  []Hover for details      Physical Medicine and Rehabilitation Consult   Reason for Consult: Debility  Referring Physician: Dr. Haroldine Laws.    HPI: Aaron [name] is a 53 y.o. male who was admitted on 10/01/2018 with 3-4 weeks with abdominal pain, peripheral edema and SOB.  History taken from chart review and patient.  He was noted to have anasarca with abnormal LFTs,  AKI and RLL effusion with concerns of PNA due to leucocytosis. Abdominal ultrasound revealed cholelithiasis with fatty liver and liver doppler negative for thrombosis. 2 D echo done revealing diffuse hypokinesis with ejection fraction of 15%.  Dr. Harrell Gave consulted for input and and felt that AKI/ elevated LFTs likely due to congestion. He underwent cardiac cath on 10/09/2018 by Dr. 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  Fluid overload improving with medication adjustment and milrinone on board.  He developed A fib with RVR and placed on IV amiodarone for rate control.  Patient not felt to be a candidate for CABG per Dr. Roxan Hockey and being treated medically. Plans for PCI of LAD/LCx later this week as well as DCCV is does not convert on amiodarone.  Therapy ongoing and patient limited by poor effort, issues with cardia as well as DOE. CIR recommended due to debility.    Review of Systems  Constitutional: Positive for malaise/fatigue. Negative for chills.  HENT: Negative for hearing loss.   Eyes: Negative for blurred vision and double vision.  Respiratory: Positive for  shortness of breath. Negative for cough and hemoptysis.   Cardiovascular: Positive for leg swelling. Negative for chest pain and palpitations.  Gastrointestinal: Positive for nausea. Negative for abdominal pain.  Musculoskeletal: Positive for myalgias.  Skin: Negative for itching and rash.  Neurological: Positive for focal weakness and weakness. Negative for dizziness and headaches.  Psychiatric/Behavioral: The patient is nervous/anxious.   All other systems reviewed and are negative.         Past Medical History:  Diagnosis Date  . Morbid obesity ([place])          Past Surgical History:  Procedure Laterality Date  . RIGHT/LEFT HEART CATH AND CORONARY ANGIOGRAPHY N/A 10/09/2018   Procedure: RIGHT/LEFT HEART CATH AND CORONARY ANGIOGRAPHY;  Surgeon: Wellington Hampshire, MD;  Location: [facility];  Service: Cardiovascular;  Laterality: N/A;         Family History  Problem Relation Age of Onset  . Alcoholism Father   . Rheumatic fever Mother   . Diabetes Mother   . Drug abuse Brother      Social History: Married. Wife is a paramedic and they have 2 grown children. He has used to be a paramedic--househusband since 2007. He reports that he has never smoked. He chews tobacco three times a day (a can?)  He reports that he does not drink alcohol or use drugs.    Allergies: No Known Allergies    No medications prior to admission.    Home: Home Living Family/patient expects to be discharged to:: Private residence  Living Arrangements: Spouse/significant other, Children Available Help at Discharge: Family, Available PRN/intermittently(family is going to try to be with him 24/ 7 if possible) Type of Home: House Home Access: Stairs to enter [employer] of Steps: 5 Entrance Stairs-Rails: Right Home Layout: Multi-level Alternate Level Stairs-Number of Steps: 5 Alternate Level Stairs-Rails: Right Bathroom Shower/Tub: Print production planner: Standard Home Equipment: Environmental consultant - 2 wheels, Bedside commode  Functional History: Prior Function Level of Independence: Independent Comments: Prior to recent weakness, 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 within last 3-4 weeks everything became a lot harder, SOB, difficulty moving and this last week could not make it up and down his own steps in his house Functional Status:  Mobility: Bed Mobility Overal bed mobility: Needs Assistance Bed Mobility: Supine to Sit, Sit to Supine Rolling: Mod assist Supine to sit: Min guard, HOB elevated Sit to supine: Min guard General bed mobility comments: Min Guard A for safety and use of bed rail to pull Transfers Overall transfer level: Needs assistance Equipment used: Rolling walker (2 wheeled) Transfers: Sit to/from Stand Sit to Stand: Min assist, From elevated surface General transfer comment: Min A to power up into standing  Ambulation/Gait Ambulation/Gait assistance: Min Web designer (Feet): 30 Feet Assistive device: Rolling walker (2 wheeled) Gait Pattern/deviations: Step-through pattern, Decreased stride length General Gait Details: Slow, mildly unsteady gait but no overt LOB. Sp02 ranged from 89-92% on RA, HR ranged from 100-142 bpm A-fib. 2.4 DOE. Declined any further.  Gait velocity: decreased  ADL: ADL Overall ADL's : Needs assistance/impaired Eating/Feeding: Independent, Bed level Grooming: Supervision/safety, Set up, Sitting, Wash/dry hands Grooming Details (indicate cue type and reason): Pt fatiguing and feeling dizzy after toileting, so returned to EOB and provided hand saniziter Upper Body Bathing: Minimal assistance, Sitting, Bed level Lower Body Bathing: Maximal assistance, Bed level Upper Body Dressing : Minimal assistance, Sitting Lower Body Dressing: Maximal assistance, Bed level Toilet Transfer: Minimal assistance, Ambulation, BSC, Grab bars, RW(BSC over toilet) Toilet Transfer Details  (indicate cue type and reason): Min A to power up into standing from [facility]. Toileting- Clothing Manipulation and Hygiene: Minimal assistance, Sit to/from stand Toileting - Clothing Manipulation Details (indicate cue type and reason): Min A for standing balance and managing gown like pt performed peri care after BM at toilet Functional mobility during ADLs: Min guard, Rolling walker General ADL Comments: Pt eager to participate when OT arrived. Pt performing mobility to toilet and performing toilet hygiene with Min A. Providing pt with education on theraband exercises.   Cognition: Cognition Overall Cognitive Status: Within Functional Limits for tasks assessed Orientation Level: Oriented X4 Cognition Arousal/Alertness: Awake/alert Behavior During Therapy: WFL for tasks assessed/performed(gets irritated easily when pushed) Overall Cognitive Status: Within Functional Limits for tasks assessed General Comments: Does not seem to comprehend the importance of mobility with regards to his health condition; states he want to get better and move however when it comes down to it, always stating excuses re nausea, pain, lack of sleep, food etc    Blood pressure 97/71, pulse 91, temperature 97.7 F (36.5 C), temperature source Oral, resp. rate (!) 23, height 6' 4" (1.93 m), weight 131.9 kg, SpO2 96 %. Physical Exam  Nursing note and vitals reviewed. Constitutional: He is oriented to person, place, and time. He appears well-developed.  Morbidly obese Anasarca  HENT:  Head: Normocephalic and atraumatic.  Eyes: EOM are normal. Right eye exhibits no discharge. Left eye exhibits no discharge.  Neck: Normal range of motion.  Neck supple.  Cardiovascular:  Irregularly irregular  Respiratory: Effort normal and breath sounds normal.  + [place]  GI: Soft. Bowel sounds are normal. He exhibits distension.  Musculoskeletal:        General: Edema present. No tenderness.     Comments: BLE with unna boots and 2+  pedal edema noted.    Neurological: He is alert and oriented to person, place, and time.  Motor: Bilateral upper extremities: 5/5 proximal distal Left lower extremity: 4+/5 proximal distal Right lower extremity: Hip flexion 3/5, knee extension 4/5, ankle dorsiflexion 4/5  Skin:  See above  Psychiatric: He has a normal mood and affect. His behavior is normal.    LabResultsLast24Hours       Results for orders placed or performed during the hospital encounter of 10/01/18 (from the past 24 hour(s))  Heparin level (unfractionated)     Status: Abnormal   Collection Time: 10/14/18  2:54 AM  Result Value Ref Range   Heparin Unfractionated 0.83 (H) 0.30 - 0.70 IU/mL  CBC     Status: Abnormal   Collection Time: 10/14/18  2:54 AM  Result Value Ref Range   WBC 8.8 4.0 - 10.5 K/uL   RBC 4.31 4.22 - 5.81 MIL/uL   Hemoglobin 11.9 (L) 13.0 - 17.0 g/dL   HCT 38.3 (L) 39.0 - 52.0 %   MCV 88.9 80.0 - 100.0 fL   MCH 27.6 26.0 - 34.0 pg   MCHC 31.1 30.0 - 36.0 g/dL   RDW 16.3 (H) 11.5 - 15.5 %   Platelets 274 150 - 400 K/uL   nRBC 0.0 0.0 - 0.2 %  Basic metabolic panel     Status: Abnormal   Collection Time: 10/14/18  2:54 AM  Result Value Ref Range   Sodium 135 135 - 145 mmol/L   Potassium 3.3 (L) 3.5 - 5.1 mmol/L   Chloride 101 98 - 111 mmol/L   CO2 28 22 - 32 mmol/L   Glucose, Bld 98 70 - 99 mg/dL   BUN 14 6 - 20 mg/dL   Creatinine, Ser 0.94 0.61 - 1.24 mg/dL   Calcium 8.1 (L) 8.9 - 10.3 mg/dL   GFR calc non Af Amer >60 >60 mL/min   GFR calc Af Amer >60 >60 mL/min   Anion gap 6 5 - 15  Cooxemetry Panel (carboxy, met, total hgb, O2 sat)     Status: None   Collection Time: 10/14/18  5:16 AM  Result Value Ref Range   Total hemoglobin 12.3 12.0 - 16.0 g/dL   O2 Saturation 64.0 %   Carboxyhemoglobin 1.5 0.5 - 1.5 %   Methemoglobin 1.4 0.0 - 1.5 %     ImagingResults(Last48hours)  No results found.    Assessment/Plan: Diagnosis: Debility   Labs independently reviewed.  Records reviewed and summated above.  1. Does the need for close, 24 hr/day medical supervision in concert with the patient's rehab needs make it unreasonable for this patient to be served in a less intensive setting? Yes  2. Co-Morbidities requiring supervision/potential complications: Acute systolic CHF (Monitor in accordance with increased physical activity and avoid UE resistance excercises), cholelithiasis, fatty liver, Tachycardia (monitor in accordance with pain and increasing activity), tachypnea (monitor RR and O2 Sats with increased physical exertion, wean supplemental oxygen when appropriate), Tachycardia (monitor in accordance with pain and increasing activity), hypokalemia (repeat labs, continue to monitor and replete as necessary), CAD/A. fib with RVR (transition from upper GTT to oral medications when appropriate) 3. Due to safety, disease  management and patient education, does the patient require 24 hr/day rehab nursing? Yes 4. Does the patient require coordinated care of a physician, rehab nurse, PT (1-2 hrs/day, 5 days/week) and OT (1-2 hrs/day, 5 days/week) to address physical and functional deficits in the context of the above medical diagnosis(es)? Yes Addressing deficits in the following areas: balance, endurance, locomotion, strength, transferring, bathing, dressing, toileting and psychosocial support 5. Can the patient actively participate in an intensive therapy program of at least 3 hrs of therapy per day at least 5 days per week? Potentially 6. The potential for patient to make measurable gains while on inpatient rehab is excellent 7. Anticipated functional outcomes upon discharge from inpatient rehab are supervision  with PT, supervision with OT, n/a with SLP. 8. Estimated rehab length of stay to reach the above functional goals is: 17-21 days. 9. Anticipated D/C setting: Home 10. Anticipated post D/C treatments: HH therapy and Home excercise  program 11. Overall Rehab/Functional Prognosis: good  RECOMMENDATIONS: This patient's condition is appropriate for continued rehabilitative care in the following setting: CIR if/when patient medically stable and able to tolerate 3 hours of therapy a day.  Patient unable to tolerate 3 hours of therapy per day, recommend SNF. Patient has agreed to participate in recommended program. Potentially Note that insurance prior authorization may be required for reimbursement for recommended care.  Comment: Rehab Admissions Coordinator to follow up.   I have personally performed a face to face diagnostic evaluation, including, but not limited to relevant history and physical exam findings, of this patient and developed relevant assessment and plan.  Additionally, I have reviewed and concur with the physician assistant's documentation above.   Delice Lesch, MD, ABPMR Bary Leriche, PA-C 10/14/2018        Revision History                        Routing History

## 2018-11-25 NOTE — Progress Notes (Signed)
Focus of session on building standing tolerance. Chose to use Vital Go bed feature instead of [place] Dec Plus today due to soft blood pressures. See below for specifics. Pt continues to be highly motivated.   11/25/18 1028  OT Visit Information  Last OT Received On 11/25/18  Assistance Needed +2  PT/OT/SLP Co-Evaluation/Treatment Yes  Reason for Co-Treatment Complexity of the patient's impairments (multi-system involvement);For patient/therapist safety  OT goals addressed during session Strengthening/ROM  History of Present Illness 53 year old male with no significant past medical history until her recent prolonged hospitalization from 09/30/20/9/20 for new onset ischemic CHF (ICM), three-vessel CAD (not a candidate for CABG per CTS), new onset A. Fib, spontaneous right thigh hematoma and GI bleed.  His hospital course was complicated by a dynamic ileus/SBO requiring NG tube.  He was discharged to CIR, returned to acute with progressive weakness and dyspnea over the last 2 to 3 days. CT chest was obtained and concerning for empyema. Chest tube placement 4/16  Precautions  Precautions Fall  Precaution Comments monitor BP, hypotensive in sitting/standing  Required Braces or Orthoses Other Brace  Other Brace RLE Bledsoe brace locked in extension when ambulating that can be d/c'd when pt can perform 10 SLRs without assist  Pain Assessment  Pain Assessment Faces  Faces Pain Scale 4  Pain Location ribs  Pain Descriptors / Indicators Sore  Pain Intervention(s) Monitored during session  Cognition  Arousal/Alertness Awake/alert  Behavior During Therapy WFL for tasks assessed/performed  Overall Cognitive Status Within Functional Limits for tasks assessed  ADL  Lower Body Dressing Total assistance;Bed level  Lower Body Dressing Details (indicate cue type and reason) brace  Bed Mobility  Overal bed mobility Needs Assistance  General bed mobility comments scooted toward L side of bed to align prior to using  standing feature of bed with min assist to flex LEs  Restrictions  RLE Weight Bearing WBAT  LLE Weight Bearing WBAT  Transfers  General transfer comment Progressively inclined to stand at 60 degrees in Vital Go tilt bed.  BP was an issue today.  Initially 98/61.  Dropped to 72/59 at 50 degrees tilt,  reclined pt to 35 degrees and BP 88/62.  Then up to 60 degrees tilt with BP 103/66.  Stayed at 60 degree tilt for several min and BP 112/89.  Once pt back to sitting position at 45 degrees, BP 96/62.  Tolerated stand with adjustments for BP.  REhab admissions coordinator came in and may take pt to Rehab today.  Tolerated 10 min of stand.    Exercises  Exercises General Upper Extremity  General Exercises - Upper Extremity  Shoulder Flexion AROM;5 reps;Standing  Shoulder Horizontal ABduction Strengthening;Both;5 reps;Standing  OT - End of Session  Activity Tolerance Treatment limited secondary to medical complications (Comment) (hypotension)  Patient left in bed;with call bell/phone within reach (in chair position)  OT Assessment/Plan  OT Plan Discharge plan remains appropriate  OT Visit Diagnosis Unsteadiness on feet (R26.81);Other abnormalities of gait and mobility (R26.89);Muscle weakness (generalized) (M62.81);Pain  OT Frequency (ACUTE ONLY) Min 3X/week  Follow Up Recommendations CIR;Supervision/Assistance - 24 hour  OT Equipment None recommended by OT (defer to next venue)  AM-PAC OT "6 Clicks" Daily Activity Outcome Measure (Version 2)  Help from another person eating meals? 4  Help from another person taking care of personal grooming? 3  Help from another person toileting, which includes using toliet, bedpan, or urinal? 1  Help from another person bathing (including washing, rinsing, drying)? 2  Help from  another person to put on and taking off regular upper body clothing? 2  Help from another person to put on and taking off regular lower body clothing? 1  6 Click Score 13  OT Goal  Progression  Progress towards OT goals Progressing toward goals  Acute Rehab OT Goals  Patient Stated Goal to get stronger  OT Goal Formulation With patient  Time For Goal Achievement 12/01/18  Potential to Achieve Goals Good  OT Time Calculation  OT Start Time (ACUTE ONLY) 0917  OT Stop Time (ACUTE ONLY) 0950  OT Time Calculation (min) 33 min  OT General Charges  $OT Visit 1 Visit  OT Treatments  $Therapeutic Activity 8-22 mins  Martie Round, OTR/L Acute Rehabilitation Services Pager: [phone] Office: [phone]

## 2018-11-26 ENCOUNTER — Inpatient Hospital Stay (HOSPITAL_COMMUNITY): Payer: Self-pay | Admitting: Occupational Therapy

## 2018-11-26 ENCOUNTER — Inpatient Hospital Stay (HOSPITAL_COMMUNITY): Payer: Self-pay | Admitting: Physical Therapy

## 2018-11-26 DIAGNOSIS — I5020 Unspecified systolic (congestive) heart failure: Secondary | ICD-10-CM

## 2018-11-26 DIAGNOSIS — J869 Pyothorax without fistula: Secondary | ICD-10-CM

## 2018-11-26 DIAGNOSIS — D62 Acute posthemorrhagic anemia: Secondary | ICD-10-CM

## 2018-11-26 DIAGNOSIS — R5381 Other malaise: Principal | ICD-10-CM

## 2018-11-26 LAB — MAGNESIUM: Magnesium: 2.3 mg/dL (ref 1.7–2.4)

## 2018-11-26 LAB — COMPREHENSIVE METABOLIC PANEL
ALT: 22 U/L (ref 0–44)
AST: 20 U/L (ref 15–41)
Albumin: 1.9 g/dL — ABNORMAL LOW (ref 3.5–5.0)
Alkaline Phosphatase: 82 U/L (ref 38–126)
Anion gap: 9 (ref 5–15)
BUN: 22 mg/dL — ABNORMAL HIGH (ref 6–20)
CO2: 26 mmol/L (ref 22–32)
Calcium: 8.1 mg/dL — ABNORMAL LOW (ref 8.9–10.3)
Chloride: 103 mmol/L (ref 98–111)
Creatinine, Ser: 1 mg/dL (ref 0.61–1.24)
GFR calc Af Amer: >60 mL/min (ref >60)
GFR calc non Af Amer: >60 mL/min (ref >60)
Glucose, Bld: 93 mg/dL (ref 70–99)
Potassium: 3.6 mmol/L (ref 3.5–5.1)
Sodium: 138 mmol/L (ref 135–145)
Total Bilirubin: 0.8 mg/dL (ref 0.3–1.2)
Total Protein: 7.5 g/dL (ref 6.5–8.1)

## 2018-11-26 LAB — CBC WITH DIFFERENTIAL/PLATELET
Abs Immature Granulocytes: 0.05 10*3/uL (ref 0.00–0.07)
Basophils Absolute: 0.1 10*3/uL (ref 0.0–0.1)
Basophils Relative: 1 %
Eosinophils Absolute: 0.1 10*3/uL (ref 0.0–0.5)
Eosinophils Relative: 1 %
HCT: 23.9 % — ABNORMAL LOW (ref 39.0–52.0)
Hemoglobin: 7.2 g/dL — ABNORMAL LOW (ref 13.0–17.0)
Immature Granulocytes: 1 %
Lymphocytes Relative: 13 %
Lymphs Abs: 1 10*3/uL (ref 0.7–4.0)
MCH: 30 pg (ref 26.0–34.0)
MCHC: 30.1 g/dL (ref 30.0–36.0)
MCV: 99.6 fL (ref 80.0–100.0)
Monocytes Absolute: 0.7 10*3/uL (ref 0.1–1.0)
Monocytes Relative: 9 %
Neutro Abs: 6.2 10*3/uL (ref 1.7–7.7)
Neutrophils Relative %: 75 %
Platelets: 214 10*3/uL (ref 150–400)
RBC: 2.4 MIL/uL — ABNORMAL LOW (ref 4.22–5.81)
RDW: 22.3 % — ABNORMAL HIGH (ref 11.5–15.5)
WBC: 8.2 10*3/uL (ref 4.0–10.5)
nRBC: 0 % (ref 0.0–0.2)

## 2018-11-26 MED ORDER — PRO-STAT SUGAR FREE PO LIQD
30.0000 mL | Freq: Two times a day (BID) | ORAL | Status: DC
  Administered 2018-11-26 – 2018-12-18 (×45): 30 mL via ORAL
  Filled 2018-11-26 (×45): qty 30

## 2018-11-26 NOTE — Progress Notes (Signed)
Initial Nutrition Assessment  RD working remotely.  DOCUMENTATION CODES:   Obesity unspecified  INTERVENTION:   - Continue Ensure Enlive po TID, each supplement provides 350 kcal and 20 grams of protein  - Add Pro-stat 30 ml BID, each supplement provides 100 kcal and 15 grams of protien  NUTRITION DIAGNOSIS:   Increased nutrient needs related to wound healing, chronic illness, other (therapies) as evidenced by estimated needs.  GOAL:   Patient will meet greater than or equal to 90% of their needs  MONITOR:   PO intake, Supplement acceptance, Weight trends, I & O's, Labs, Skin  REASON FOR ASSESSMENT:   Malnutrition Screening Tool    ASSESSMENT:   53 year old male without significant medical history who was originally admitted on 10/01/18 with 3-4 weeks history of progressive edema and SOB. Pt  was found to have fluid overload with anasarca, ascites, AKI, and abnormal LFTs. 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 Hospital course complicated by A-fib with RVR but patient developed spontaneous right thigh hematoma 3/18 as well as GIB due to esophageal ulcers with gastritis and esophagitis on 3/22. Pt also had issue with abdominal pain due to ileus treated with NGT. Pt was admitted to CIR 4/9 due to debility. On 4/16, pt reported productive cough as well as increase in SOB. CT chest ordered for work up and revealed right sided empyema with complete collapse of RLL and partial collapse of RML/RUL with reactive mediastinal lymph nodes. CT surgery and TRH were consulted for assistance and Pt was discharged to acute hospital for monitoring and treatment. Dr. Dorris Fetch recommended VIR chest tube placement. Pt underwent drainage of 800 cc thick brown purulent appearing fluid on 4/16 with placement of PleurEvac and continued to have drainage. Chest tube removed on 4/27. Pt admitted back to CIR on 4/29.  Attempted to speak with pt via phone call x 2 but pt did not answer.  Reviewed RD  notes from pt's acute admission. Pt reported eating 100% of all meals while admitted in acute care.  Noted pt with weight loss of 15.1 kg since 11/05/18. This is an 11.2% weight loss which is significant for timeframe. Suspect majority of weight loss can be explained by fluid status given good PO intake and CHF diagnosis and continued diuresis. Will monitor trends.  Will continue with current supplement regimen at this time as [facility] indicates pt is accepting Ensure Enlive as ordered. Will add Pro-stat to aid in wound healing.  Reviewed RN edema assessment. Pt with non-pitting edema to BLE.  Meal Completion: 10-100% x 2 meals  Medications reviewed and include: Ensure Enlive TID, ferrous sulfate, folic acid, Lasix 40 mg daily, magnesium oxide, Protonix, Miralax, IV abx  Labs reviewed: hemoglobin 7.2 (L)  UOP: 800 ml x 24 hours  NUTRITION - FOCUSED PHYSICAL EXAM:  Unable to complete at this time. RD working remotely.  Diet Order:   Diet Order            Diet Heart Room service appropriate? Yes with Assist; Fluid consistency: Thin  Diet effective now              EDUCATION NEEDS:   Not appropriate for education at this time  Skin:  Skin Assessment: Skin Integrity Issues: Stage II: right nare, right buttocks Incisions: closed incision to chest  Last BM:  11/24/18  Height:   Ht Readings from Last 1 Encounters:  11/25/18 6' 0.4" (1.839 m)    Weight:   Wt Readings from Last 1  Encounters:  11/26/18 119.2 kg    Ideal Body Weight:  80.9 kg  BMI:  Body mass index is 35.25 kg/m.  Estimated Nutritional Needs:   Kcal:  2300-2500  Protein:  115-130 grams  Fluid:  >/= 2.0 L    Earma Reading, MS, RD, LDN Inpatient Clinical Dietitian Pager: [phone] Weekend/After Hours: [phone]

## 2018-11-26 NOTE — Evaluation (Signed)
Occupational Therapy Assessment and Plan  Patient Details  Name: Aaron [name] MRN: [id number] Date of Birth: 30-Mar-1966  OT Diagnosis: muscular wasting and disuse atrophy, muscle weakness (generalized), other lymphedema and swelling of limb Rehab Potential: Rehab Potential (ACUTE ONLY): Good ELOS: 20-21 days   Today's Date: 11/26/2018 OT Individual Time: 1020-1120 OT Individual Time Calculation (min): 60 min     Problem List:  Patient Active Problem List   Diagnosis Date Noted  . PAF (paroxysmal atrial fibrillation) ([place])   . Coronary artery disease involving native coronary artery of native heart without angina pectoris   . Acute systolic congestive heart failure ([place])   . Pleural effusion   . Empyema of pleural space ([place]) 11/12/2018  . Debility 11/05/2018  . Pressure injury of skin 11/04/2018  . Iron deficiency anemia   . Orthostatic hypotension   . Hematoma of right thigh 10/30/2018  . Acute blood loss anemia 10/30/2018  . Tobacco abuse 10/30/2018  . Gastritis with bleeding 10/30/2018  . Esophagitis, [place] grade C 10/30/2018  . Protein-calorie malnutrition, severe 10/26/2018  . SOB (shortness of breath)   . Hypokalemia   . Atrial fibrillation with rapid ventricular response ([place]) 10/12/2018  . Morbid obesity ([place]) 10/09/2018  . Acute systolic CHF (congestive heart failure) ([place]) 10/02/2018  . Acute hypoxemic respiratory failure ([place]) 10/02/2018  . Anasarca 10/01/2018  . AKI (acute kidney injury) ([place]) 10/01/2018    Past Medical History:  Past Medical History:  Diagnosis Date  . Acute systolic (congestive) heart failure ([place]) 10/2018  . Atrial fibrillation, new onset ([place]) 10/2018  . Coronary artery disease   . Morbid obesity ([place])    Past Surgical History:  Past Surgical History:  Procedure Laterality Date  . CHEST TUBE INSERTION Right 11/16/2018  . ESOPHAGOGASTRODUODENOSCOPY (EGD) WITH PROPOFOL N/A 10/18/2018   Procedure: ESOPHAGOGASTRODUODENOSCOPY (EGD)  WITH PROPOFOL;  Surgeon: Ronnette Juniper, MD;  Location: [facility];  Service: Gastroenterology;  Laterality: N/A;  . IR PERC PLEURAL DRAIN W/INDWELL CATH W/IMG GUIDE  11/12/2018  . RIGHT/LEFT HEART CATH AND CORONARY ANGIOGRAPHY N/A 10/09/2018   Procedure: RIGHT/LEFT HEART CATH AND CORONARY ANGIOGRAPHY;  Surgeon: Wellington Hampshire, MD;  Location: [facility];  Service: Cardiovascular;  Laterality: N/A;    Assessment & Plan Clinical Impression: Aaron [name] is a 53 year old male without significant medical history who was originally admitted on 10/01/2018 with 3-4 weeks history of progressive edema history taken from chart review and patient. Patient had edema in bilateral lower extremities along with shortness of breath and difficulty with ambulation. He was found to have fluid overload with anasarca, ascites, AKI, and abnormal LFTs. 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 He was not felt to be a good surgical candidate. Hospital course complicated by A fib with RVR but patient developed spontaneous right thigh hematoma 3/18 as well as GIB due to esophageal ulcers with gastritis and esophagitis on 3/22. He has also had issue with abdominal pain due to ileus Tx with NGT, A fib with NSVT requiring amiodarone for rate control, hyptension reated with NGT, acute systolic CHF as well as issues with hypotension requiring tilt table for standing. (See prior H&P for full details) He was admitted to [facility] 11/05/2018 due to debility and required supplemental oxygen due to DOE. On 04/16, he reported productive cough as well as increase in SOB. CXR done revealing concerns of hydro or hemothorax. CT chest ordered for work up and revealed right sided empyema with complete collapse of  RLE and partial collapse of RML/RUL with reactive mediastinal lymph nodes. CT surgery and TRH were consulted for assistance and he was discharged to acute hospital for monitoring and treatment. Dr. Roxan Hockey recommended VIR chest tube  placement. He underwent drainage of 800 cc thick brown purulent appearing fluid on 4/16 with placement of PleurEvac and continued to have drainage. He was started on Vanc/Zosyn and cultures positive for E coli therefore narrowed to ampicillin on 11/15/2018. Chest tube placed to water seal due to air leak and kept in place due to ongoing drainage. On 4/23, he developed bradycardia with HR in 30's therefore digoxin discontinued and coreg decreased 4/26 due to ongoing issues. He was started on trial of clamping, received thrombolytics 11/22/2018 and chest tube removed on 4/27. Follow up CXR with stable right residual empyema without pneumothorax. Dr. Roxan Hockey recommends "probably 4 weeks antibiotic regimen" to treat empyema. He had drop in H/H to 6.4/21.2 on 4/24 and received 1 units PRBCs. Anemia of chronic disease supplemented with feraheme 4/26. Respiratory status improved but he continues to be severely deconditioned. He is able to tolerate Vital Tilt bed at 60 degrees of 10 minutes of standing. He has been tolerating OOB for 2 hrs/day. He was cleared to resume CIR course on 4/29.   Patient transferred to CIR on 11/25/2018 .    Patient currently requires total with lower body basic self-care skills secondary to muscle weakness and muscle joint tightness, decreased cardiorespiratoy endurance and decreased sitting balance and decreased standing balance.  Prior to hospitalization, patient was fully independent.  Patient will benefit from skilled intervention to increase independence with basic self-care skills prior to discharge home with care partner.  Anticipate patient will require minimal physical assistance and follow up home health.  OT - End of Session Activity Tolerance: Tolerates < 10 min activity with changes in vital signs Endurance Deficit: Yes Endurance Deficit Description: hypotensive in sitting OT Assessment Rehab Potential (ACUTE ONLY): Good OT Barriers to Discharge: Medical  stability;Weight;New oxygen;Wound Care OT Patient demonstrates impairments in the following area(s): Balance;Edema;Safety;Endurance;Sensory;Skin Integrity;Motor OT Basic ADL's Functional Problem(s): Grooming;Bathing;Dressing;Toileting OT Transfers Functional Problem(s): Toilet;Tub/Shower OT Additional Impairment(s): None OT Plan OT Intensity: Minimum of 1-2 x/day, 45 to 90 minutes OT Frequency: 5 out of 7 days OT Duration/Estimated Length of Stay: 20-21 days OT Treatment/Interventions: Balance/vestibular training;Community reintegration;Disease mangement/prevention;Neuromuscular re-education;Patient/family education;Self Care/advanced ADL retraining;Therapeutic Exercise;UE/LE Coordination activities;Wheelchair propulsion/positioning;UE/LE Strength taining/ROM;Therapeutic Activities;Psychosocial support;Pain management;Functional mobility training;DME/adaptive equipment instruction;Skin care/wound managment;Discharge planning;Cognitive remediation/compensation OT Self Feeding Anticipated Outcome(s): no goal OT Basic Self-Care Anticipated Outcome(s): supervision - Min A OT Toileting Anticipated Outcome(s): Min A OT Bathroom Transfers Anticipated Outcome(s): Min A OT Recommendation Patient destination: Home Follow Up Recommendations: Home health OT Equipment Recommended: To be determined   Skilled Therapeutic Intervention Pt seen for initial evaluation and ADL retraining from bed level. Pt has low blood pressure which drops very low in sitting so worked on bed mobility skills with self care.  Pt is connected to a IV line so could not don shirt.  Pt did not have pants on but preferred to just leave them off as he could not get out of bed today.    Pt needed to void. Placed bedpan and had a dark soft stool.  Pt rolled onto side with min A for leg positioning and total A for clean up.  Pt able to cleanse perineal area in front but needs assist with upper legs.    Pt needed to be repositioned  frequently as he slides down  easily and he is 6'4".  Pt can pull self up using rails.    Reviewed OT and pt goals and ELOS. Pt is familiar with rehab process as he was just here in [place] recently.     OT Evaluation Precautions/Restrictions  Precautions Precautions: Fall Precaution Comments: monitor BP, hypotensive in sitting/standing Required Braces or Orthoses: Other Brace Other Brace: RLE Bledsoe brace locked in extension when ambulating that can be d/c'd when pt can perform 10 SLRs without assist Restrictions RLE Weight Bearing: Weight bearing as tolerated LLE Weight Bearing: Weight bearing as tolerated    Vital Signs Therapy Vitals Pulse Rate: 85 BP: 99/76 Patient Position (if appropriate): Orthostatic Vitals Pain Pain Assessment Pain Scale: 0-10 Pain Score: 0-No pain Home Living/Prior Functioning Home Living Family/patient expects to be discharged to:: Private residence Living Arrangements: Spouse/significant other Available Help at Discharge: Family, Available 24 hours/day Type of Home: House Home Access: Stairs to enter [employer] of Steps: unknown  Entrance Stairs-Rails: Right Home Layout: One level Alternate Level Stairs-Number of Steps: 5 Alternate Level Stairs-Rails: Right Bathroom Shower/Tub: Tub/shower unit  Lives With: Spouse, Son Prior Function  Able to Take Stairs?: Yes Driving: Yes Vocation: On disability Comments: prior to hospitalization, has been in hospital since 3/5.  has progressed to using Clarise Cruz for transfers ADL  bed level only today due to hypotension in sitting Vision Baseline Vision/History: Wears glasses Wears Glasses: Reading only Patient Visual Report: No change from baseline Vision Assessment?: No apparent visual deficits Perception  Perception: Within Functional Limits Praxis Praxis: Intact Cognition Overall Cognitive Status: Within Functional Limits for tasks assessed Orientation Level: Person;Place;Situation Person:  Oriented Place: Oriented Situation: Oriented Month: April Day of Week: Correct Memory: Appears intact Immediate Memory Recall: Sock;Blue;Bed Memory Recall: Sock;Blue;Bed Memory Recall Sock: Without Cue Memory Recall Blue: Without Cue Memory Recall Bed: With Cue Sustained Attention: Appears intact Awareness: Appears intact Problem Solving: Appears intact Safety/Judgment: Appears intact Sensation Sensation Light Touch: Impaired Detail Peripheral sensation comments: decreased appreciation to light touch due to nerve damage from hematoma .  Light Touch Impaired Details: Impaired RLE Proprioception: Appears Intact Coordination Gross Motor Movements are Fluid and Coordinated: No Fine Motor Movements are Fluid and Coordinated: Yes Coordination and Movement Description: poor coordination in the RLE from weakness  Heel Shin Test: unable to perform on the R Motor  Motor Motor: Other (comment) Motor - Skilled Clinical Observations: muscle paralysis/weakness in the R quad. generalized weakness Mobility  Bed Mobility Bed Mobility: Rolling Left;Rolling Right;Supine to Sit;Sit to Supine Rolling Right: Minimal Assistance - Patient > 75% Rolling Left: Minimal Assistance - Patient > 75% Supine to Sit: Maximal Assistance - Patient - Patient 25-49% Sit to Supine: Maximal Assistance - Patient 25-49%  Trunk/Postural Assessment  Cervical Assessment Cervical Assessment: Exceptions to WFL(forward head) Thoracic Assessment Thoracic Assessment: Exceptions to WFL(rounded shoulders) Lumbar Assessment Lumbar Assessment: Exceptions to WFL(posterior pelvic tilt) Postural Control Postural Control: Within Functional Limits  Balance Balance Balance Assessed: Yes Static Sitting Balance Static Sitting - Level of Assistance: 5: Stand by assistance Dynamic Sitting Balance Dynamic Sitting - Level of Assistance: 4: Min assist Sitting balance - Comments: 1 UE supported  Static Standing Balance Static  Standing - Level of Assistance: Not tested (comment) Extremity/Trunk Assessment RUE Assessment RUE Assessment: Within Functional Limits General Strength Comments: 4-/5 grossly LUE Assessment LUE Assessment: Within Functional Limits General Strength Comments: 4-/5 grossly     Refer to Care Plan for Long Term Goals  Recommendations for other services: None  Discharge Criteria: Patient will be discharged from OT if patient refuses treatment 3 consecutive times without medical reason, if treatment goals not met, if there is a change in medical status, if patient makes no progress towards goals or if patient is discharged from hospital.  The above assessment, treatment plan, treatment alternatives and goals were discussed and mutually agreed upon: by patient  [place] 11/26/2018, 11:00 AM

## 2018-11-26 NOTE — Progress Notes (Signed)
Orthopedic Tech Progress Note Patient Details:  Aaron [name] June 04, 1966 [id number]  Ortho Devices Type of Ortho Device: Radio broadcast assistant Ortho Device/Splint Location: bilateral Ortho Device/Splint Interventions: Adjustment, Application, Ordered   Post Interventions Patient Tolerated: Well Instructions Provided: Care of device, Adjustment of device   Aaron [name] 11/26/2018, 11:57 AM

## 2018-11-26 NOTE — Consult Note (Signed)
Medical Consultation   Aaron [name]  HWK:[id number]  DOB: Apr 17, 1966  DOA: 11/25/2018  PCP: [facility]   Requesting physician: Wynn Banker  Reason for consultation: Medical management - lots to keep track of.  Would appreciate [place] following along to keep him recovering.  History of Present Illness: Aaron [name] is an 53 y.o. male with morbid obesity; CAD (not a candidate for CABG); afib; and acute systolic CHF.  He was admitted from 3/5-4/9 for acute systolic CHF with EF 10-15% and anasarca; he developed an upper GI bleed while hospitalized requiring 1 unit PRBC.  He was discharged to CIR but developed SOB again on 4/16 and was transferred back to the acute side.  He remained hospitalized through 4/29 and was discharged yesterday to CIR again.  During his last hospitalization, he was found to have a R-sided empyema that grew E. Coli, for which he was recommended to be treated with Ampicillin through 5/7.  He reports that he is generally feeling well.  His breathing is much improved and his chest tube was been removed.  He is concerned about his BP, as it appears that he is having orthostasis.     Review of Systems:  ROS As per HPI otherwise 10 point review of systems negative.    Past Medical History: Past Medical History:  Diagnosis Date  . Acute systolic (congestive) heart failure ([facility]) 10/2018  . Atrial fibrillation, new onset ([facility]) 10/2018  . Coronary artery disease   . Morbid obesity ([facility])     Past Surgical History: Past Surgical History:  Procedure Laterality Date  . CHEST TUBE INSERTION Right 11/16/2018  . ESOPHAGOGASTRODUODENOSCOPY (EGD) WITH PROPOFOL N/A 10/18/2018   Procedure: ESOPHAGOGASTRODUODENOSCOPY (EGD) WITH PROPOFOL;  Surgeon: Kerin Salen, MD;  Location: [facility] ENDOSCOPY;  Service: Gastroenterology;  Laterality: N/A;  . IR PERC PLEURAL DRAIN W/INDWELL CATH W/IMG GUIDE  11/12/2018  . RIGHT/LEFT HEART CATH AND CORONARY  ANGIOGRAPHY N/A 10/09/2018   Procedure: RIGHT/LEFT HEART CATH AND CORONARY ANGIOGRAPHY;  Surgeon: Iran Ouch, MD;  Location: [facility];  Service: Cardiovascular;  Laterality: N/A;     Allergies:  No Known Allergies   Social History:  reports that he has never smoked. He has quit using smokeless tobacco.  His smokeless tobacco use included snuff. He reports that he does not drink alcohol or use drugs.   Family History: Family History  Problem Relation Age of Onset  . Alcoholism Father   . Rheumatic fever Mother   . Diabetes Mother   . Drug abuse Brother       Physical Exam: Vitals:   11/25/18 1941 11/26/18 0525 11/26/18 0911 11/26/18 1522  BP: 104/73 113/78 99/76 (!) 102/56  Pulse: (!) 105 78 85 72  Resp: 18 18  18   Temp: 98.3 F (36.8 C) 98 F (36.7 C)  98.4 F (36.9 C)  TempSrc: Oral Oral  Oral  SpO2: 98% 95%  99%  Weight:  119.2 kg    Height:        Constitutional: Alert and awake, oriented x3, not in any acute distress.  He appears chronically ill and much order than stated ago. Eyes: EOMI, irises appear normal, anicteric sclera,  ENMT: external ears and nose appear normal, normal hearing, Lips appear normal Neck: neck appears normal, no masses, normal ROM, no thyromegaly, no JVD  CVS: S1-S2 clear, no murmur rubs or gallops, no LE edema,  normal pedal pulses  Respiratory:  clear to auscultation bilaterally, no wheezing, rales or rhonchi. Respiratory effort normal. No accessory muscle use.  Abdomen: soft nontender, nondistended, normal bowel sounds, no hepatosplenomegaly, no hernias  Musculoskeletal: : no cyanosis, clubbing or edema noted bilaterally; B Unna boots in place Neuro: Cranial nerves II-XII intact, strength, sensation, reflexes Psych: judgement and insight appear normal, stable mood and affect, mental status Skin: no rashes or lesions or ulcers, no induration or nodules    Data reviewed:  I have personally reviewed the recent labs and  imaging studies  Pertinent Labs:   BUN 22/Creatinine 1.0/>60 Albumin 1.9 Hgb 7.2; 7.6 on 4/28   Inpatient Medications:   Scheduled Meds: . amiodarone  200 mg Oral Daily  . apixaban  5 mg Oral BID  . carvedilol  3.125 mg Oral Daily  . feeding supplement (ENSURE ENLIVE)  237 mL Oral TID BM  . feeding supplement (PRO-STAT SUGAR FREE 64)  30 mL Oral BID  . ferrous sulfate  325 mg Oral TID WC  . folic acid  1 mg Oral Daily  . furosemide  40 mg Oral Daily  . magnesium oxide  400 mg Oral BID  . pantoprazole  40 mg Oral BID AC  . polyethylene glycol  17 g Oral BID  . rosuvastatin  20 mg Oral q1800   Continuous Infusions: . sodium chloride 250 mL (11/25/18 1855)  . ampicillin (OMNIPEN) IV 2 g (11/26/18 4585)     Radiological Exams on Admission: No results found.  Impression/Recommendations Active Problems:   Debility  Debility -Patient has been admitted to rehab for ongoing services -TRH has been asked to consult for medical management  R-sided empyema -s/p chest tube placement, now removed -Culture grew pan-sensitive E coli; initially treated with Vanc/Zosyn but now on IV Ampicillin through 5/7 -Reports improved breathing and no current discomfort  Severe systolic CHF -3/6 echo with severely depressed EF, 15%, with diffuse hypokinesis -Continue low-dose Coreg if able to tolerate (see below) -He was previously taking Cozaar - and likely should be continuing to take this - if able to tolerate  CAD -3/13 cath with severe 3-vessel CAD -He was not considered to be a CAGB candidate as of 3/13; a viability study can be considered once his heart failure improves  -Continue Crestor   Afib -Rate controlled with low-dose Coreg and Amiodarone -He experienced bradycardia while hospitalized but this resolved once Digoxin was stopped -On Eliquis for [facility]  Hypotension -Patient with persistent (primarily orthostatic) hypotension -Likely associated with profound systolic CHF -He  has Unna boots and further compressive devices are likely not an option at this time -He may need further cardiology consultation to determine the best approach to maximizing his heart muscle recovery while also maintaining some level of normotension      Thank you for this consultation.  Our [facility] hospitalist team will follow the patient with you.   Time Spent: 60 minutes   Jonah Blue M.D. [facility] 11/26/2018, 3:38 PM

## 2018-11-26 NOTE — Progress Notes (Signed)
Orthopedic Tech Progress Note Patient Details:  Aaron [name] 03-13-1966 [id number] RN just called requesting for patient to get in the CPM CPM Right Knee CPM Right Knee: On Right Knee Flexion (Degrees): 90 Right Knee Extension (Degrees): 0  Post Interventions Patient Tolerated: Well Instructions Provided: Care of device, Adjustment of device  [name] 11/26/2018, 5:54 PM

## 2018-11-26 NOTE — Progress Notes (Addendum)
Occupational Therapy Session Note  Patient Details  Name: Aaron [name] MRN: [id number] Date of Birth: Jan 05, 1966  Today's Date: 11/26/2018 OT individual:  12-12:30 Total individual minutes:  30 minutes  Skilled Therapeutic Interventions/Progress Updates: Upon approach for OT, patient stated he was fatigued and light headed and stated his hemoglobin is "7" and that he has a resolving blood clot in right leg.  Suggestion from staff report has been to complete any ADL related activities bed level at this time until blood pressure, hemoglobin, and wellness increase.  Still, he was willing to work on bed mobility to help practice with self bed mobility and making himself comfortable in bed.   As well, he practiced bed mobility for increase lower body bathing and dressing and for endurance for activities.  As well, he completed abdominal and upper extremity strengthening to ease bed mobility and lower body self care.  He required setup for lunch tray.   He was left eating lunch in bed upon this clinician's room exit.    Therapy Documentation Precautions:  Precautions Precautions: Fall Precaution Comments: monitor BP, hypotensive in sitting/standing Required Braces or Orthoses: Other Brace Other Brace: RLE Bledsoe brace locked in extension when ambulating that can be d/c'd when pt can perform 10 SLRs without assist Restrictions Weight Bearing Restrictions: No RLE Weight Bearing: Weight bearing as tolerated LLE Weight Bearing: Weight bearing as tolerated    Pain: Pain Assessment Pain Score: 0-No pain   Therapy/Group: Individual Therapy  Rozelle Logan 11/26/2018, 2:10 PM

## 2018-11-26 NOTE — Evaluation (Signed)
Physical Therapy Assessment and Plan  Patient Details  Name: Aaron [name] MRN: [id number] Date of Birth: [birth year]  PT Diagnosis: Difficulty walking, Impaired sensation and Muscle weakness Rehab Potential: Fair ELOS: 25-28 days    Today's Date: 11/26/2018 PT Individual Time: 0800-0900  ANF 1535-1630 PT Individual Time Calculation (min): 60 min  And 55 min   Problem List:  Patient Active Problem List   Diagnosis Date Noted  . PAF (paroxysmal atrial fibrillation) ([place])   . Coronary artery disease involving native coronary artery of native heart without angina pectoris   . Acute systolic congestive heart failure ([place])   . Pleural effusion   . Empyema of pleural space ([place]) 11/12/2018  . Debility 11/05/2018  . Pressure injury of skin 11/04/2018  . Iron deficiency anemia   . Orthostatic hypotension   . Hematoma of right thigh 10/30/2018  . Acute blood loss anemia 10/30/2018  . Tobacco abuse 10/30/2018  . Gastritis with bleeding 10/30/2018  . Esophagitis, [place] grade C 10/30/2018  . Protein-calorie malnutrition, severe 10/26/2018  . SOB (shortness of breath)   . Hypokalemia   . Atrial fibrillation with rapid ventricular response ([place]) 10/12/2018  . Morbid obesity ([place]) 10/09/2018  . Acute systolic CHF (congestive heart failure) ([place]) 10/02/2018  . Acute hypoxemic respiratory failure ([place]) 10/02/2018  . Anasarca 10/01/2018  . AKI (acute kidney injury) ([place]) 10/01/2018    Past Medical History:  Past Medical History:  Diagnosis Date  . Acute systolic (congestive) heart failure ([place]) 10/2018  . Atrial fibrillation, new onset ([place]) 10/2018  . Coronary artery disease   . Morbid obesity ([place])    Past Surgical History:  Past Surgical History:  Procedure Laterality Date  . CHEST TUBE INSERTION Right 11/16/2018  . ESOPHAGOGASTRODUODENOSCOPY (EGD) WITH PROPOFOL N/A 10/18/2018   Procedure: ESOPHAGOGASTRODUODENOSCOPY (EGD) WITH PROPOFOL;  Surgeon: Ronnette Juniper, MD;  Location:  [facility];  Service: Gastroenterology;  Laterality: N/A;  . IR PERC PLEURAL DRAIN W/INDWELL CATH W/IMG GUIDE  11/12/2018  . RIGHT/LEFT HEART CATH AND CORONARY ANGIOGRAPHY N/A 10/09/2018   Procedure: RIGHT/LEFT HEART CATH AND CORONARY ANGIOGRAPHY;  Surgeon: Wellington Hampshire, MD;  Location: [facility];  Service: Cardiovascular;  Laterality: N/A;    Assessment & Plan Clinical Impression: Patient is a 53 year old male without significant medical history who was originally admitted on 10/01/2018 with 3-4 weeks history of progressive edema history taken from chart review and patient.  Patient had edema in bilateral lower extremities along with shortness of breath and difficulty with ambulation.  He was found to have fluid overload with anasarca, ascites, AKI, and abnormal LFTs. 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  He was not felt to be a good surgical candidate. Hospital course complicated by A fib with RVR but patient developed spontaneous right thigh hematoma 3/18 as well as GIB due to esophageal ulcers with gastritis and esophagitis on 3/22. He has also had issue with abdominal pain due to ileus Tx with  NGT, A fib with NSVT requiring amiodarone for rate control,  hyptension reated with NGT, acute systolic CHF as well as issues with hypotension requiring tilt table for standing. (See prior H&P for full details)  He was admitted to [facility] 11/05/2018 due to debility and required supplemental oxygen due to DOE. On 04/16, he reported productive cough as well as increase in SOB. CXR done revealing concerns of hydro or hemothorax.  CT chest ordered for work up and revealed right sided empyema with complete collapse  of RLE and partial collapse of RML/RUL with reactive mediastinal lymph nodes.  CT surgery and TRH were consulted for assistance and he was discharged to acute hospital for monitoring and treatment. Dr. Roxan Hockey recommended VIR chest tube placement. He underwent drainage of 800 cc thick brown  purulent appearing fluid on 4/16 with placement of PleurEvac and continued to have drainage.  He was started on Vanc/Zosyn and cultures positive for E coli therefore narrowed to ampicillin on 11/15/2018.  Chest tube placed to water seal due to air leak and kept in place due to ongoing drainage.  On 4/23, he developed bradycardia with HR in 30's therefore digoxin discontinued and coreg decreased 4/26 due to ongoing issues. He was started on trial of clamping, received thrombolytics 11/22/2018 and chest tube removed on 4/27.  Follow up CXR with stable right residual empyema without pneumothorax. Dr. Roxan Hockey recommends "probably 4 weeks antibiotic regimen" to treat empyema. He had drop in H/H to 6.4/21.2 on 4/24 and received 1 units PRBCs.  Anemia of chronic disease supplemented with feraheme 4/26. Respiratory status improved but he continues to be severely deconditioned. He is able to tolerate Vital Tilt bed at 60 degrees of 10 minutes of standing.  He has been tolerating OOB for 2 hrs/day.  He was cleared to resume CIR course on 4/29.    Patient transferred to CIR on 11/25/2018 .   Patient currently requires max with mobility secondary to muscle weakness and muscle paralysis, decreased cardiorespiratoy endurance and decreased sitting balance, decreased standing balance, decreased balance strategies and difficulty maintaining precautions.  Prior to hospitalization, patient was independent  with mobility and lived with Spouse, Son in a House home.  Home access is unknown Stairs to enter.  Patient will benefit from skilled PT intervention to maximize safe functional mobility, minimize fall risk and decrease caregiver burden for planned discharge home with 24 hour assist.  Anticipate patient will benefit from follow up [facility] at discharge.  PT - End of Session Activity Tolerance: Tolerates < 10 min activity, no significant change in vital signs Endurance Deficit: Yes PT Assessment Rehab Potential (ACUTE/IP ONLY):  Fair PT Barriers to Discharge: [place] home environment;Decreased caregiver support;Home environment access/layout;Insurance for SNF coverage;Weight bearing restrictions PT Patient demonstrates impairments in the following area(s): Balance;Edema;Endurance;Motor;Safety;Sensory;Pain;Skin Integrity PT Transfers Functional Problem(s): Bed Mobility;Bed to Chair;Furniture;Floor;Other (comment) PT Locomotion Functional Problem(s): Ambulation;Wheelchair Mobility;Stairs PT Plan PT Intensity: Minimum of 1-2 x/day ,45 to 90 minutes PT Frequency: 5 out of 7 days PT Duration Estimated Length of Stay: 25-28 days  PT Treatment/Interventions: Ambulation/gait training;Balance/vestibular training;Discharge planning;Community reintegration;Disease management/prevention;DME/adaptive equipment instruction;Functional electrical stimulation;Functional mobility training;Patient/family education;Pain management;Neuromuscular re-education;Psychosocial support;Skin care/wound management;Splinting/orthotics;Therapeutic Exercise;Therapeutic Activities;Stair training;UE/LE Coordination activities;UE/LE Strength taining/ROM;Visual/perceptual remediation/compensation;Wheelchair propulsion/positioning PT Transfers Anticipated Outcome(s): Min A with LRAD  PT Locomotion Anticipated Outcome(s): Supervision assist WC level.  PT Recommendation Recommendations for Other Services: Neuropsych consult Follow Up Recommendations: Home health PT Patient destination: Home Equipment Recommended: Wheelchair (measurements);Wheelchair cushion (measurements);Rolling walker with 5" wheels;To be determined;Sliding board  Skilled Therapeutic Intervention   Session 1   Pt received supine in bed and agreeable to PT.  PT instructed patient in PT Evaluation and initiated treatment intervention; see below for results. PT educated patient in [place], rehab potential, rehab goals, and discharge recommendations.  PT assessed orthostatic vital signs  supine: 108/64. HR 100. Sitting EOB 0 min 58/46, HR 147; asymptomatic. Sitting 1 min 82/68, HR 45; Asymptomatic. Scooting EOB with max assist initially and then total assist due to increased dizziness and fatigue. Vitals re-assessed  65/53. HR 81. Continued s/s. retutned to supine with max assist from PT and heavy use of bed rails.  Pt  left supine in bed with call bell in reach and all needs met.     Session 2.  Pt received supine in bed and agreeable to PT. Rolling r and for brief and maximove sling placement. Transfer to [facility] in [place]. Pt transported to rehab gym. Reciprocal scooting in WC with max assist for improved sitting position. PT adjusted ELR for improved sitting tolerance and pain management. WC mobility x 80 ft with min-superivison assist and moderate cues for turning technique. Pt reports significant fatigue following WC propulsion. Pt returned to room and performed maxi move transfer to bed. Pt left supine in bed with call bell in reach and all needs met.        PT Evaluation Precautions/Restrictions Precautions Precautions: Fall Precaution Comments: monitor BP, hypotensive in sitting/standing Required Braces or Orthoses: Other Brace Other Brace: RLE Bledsoe brace locked in extension when ambulating that can be d/c'd when pt can perform 10 SLRs without assist Restrictions RLE Weight Bearing: Weight bearing as tolerated LLE Weight Bearing: Weight bearing as tolerated General   Vital SignsTherapy Vitals Temp: 98 F (36.7 C) Temp Source: Oral Pulse Rate: 78 Resp: 18 BP: 113/78 Patient Position (if appropriate): Orthostatic Vitals Oxygen Therapy SpO2: 95 % O2 Device: Room Air Pain Pain Assessment Pain Scale: 0-10 Pain Score: 0-No pain Home Living/Prior Functioning Home Living Available Help at Discharge: Family;Available 24 hours/day Type of Home: House Home Access: Stairs to enter [employer] of Steps: unknown  Home Layout: One level  Lives With:  Spouse;Son Prior Function  Able to Take Stairs?: Yes Driving: Yes Vocation: On disability Comments: prior to hospitalization, has been in hospital since 3/5.  has progressed to using Clarise Cruz for transfers Vision/Perception  Perception Perception: Within Functional Limits Praxis Praxis: Intact  Cognition Overall Cognitive Status: Within Functional Limits for tasks assessed Sustained Attention: Appears intact Memory: Appears intact Awareness: Appears intact Problem Solving: Appears intact Safety/Judgment: Appears intact Sensation Sensation Light Touch: Impaired Detail Peripheral sensation comments: decreased appreciation to light touch due to nerve damage from hematoma .  Light Touch Impaired Details: Impaired RLE Proprioception: Appears Intact Coordination Gross Motor Movements are Fluid and Coordinated: No Fine Motor Movements are Fluid and Coordinated: Yes Coordination and Movement Description: poor coordination in the RLE from weakness  Heel Shin Test: unable to perform on the R Motor  Motor Motor: Other (comment) Motor - Skilled Clinical Observations: muscle paralysis/weakness in the R quad. generalized weakness  Mobility Bed Mobility Bed Mobility: Rolling Left;Rolling Right;Supine to Sit;Sit to Supine Rolling Right: Minimal Assistance - Patient > 75% Rolling Left: Minimal Assistance - Patient > 75% Supine to Sit: Maximal Assistance - Patient - Patient 25-49% Sit to Supine: Maximal Assistance - Patient 25-49% Transfers Transfers: (unable to perform due to orthostatic BP) Locomotion  Gait Ambulation: No Gait Gait: No Stairs / Additional Locomotion Stairs: No Wheelchair Mobility Wheelchair Mobility: No  Trunk/Postural Assessment  Cervical Assessment Cervical Assessment: Exceptions to WFL(forward head) Thoracic Assessment Thoracic Assessment: Exceptions to WFL(rounded shoulders) Lumbar Assessment Lumbar Assessment: Exceptions to WFL(posterior pelvic  tilt) Postural Control Postural Control: Within Functional Limits  Balance Balance Balance Assessed: Yes Static Sitting Balance Static Sitting - Level of Assistance: 5: Stand by assistance Dynamic Sitting Balance Dynamic Sitting - Level of Assistance: 4: Min assist Sitting balance - Comments: 1 UE supported  Static Standing Balance Static Standing - Level of Assistance: Not  tested (comment) Extremity Assessment      RLE Assessment RLE Assessment: Exceptions to [facility] General Strength Comments: 3-/5 knee extension. grossly 3/5 all other movements.  LLE Assessment LLE Assessment: Exceptions to [facility] General Strength Comments: grossly 3+/5 proximal to distal     Refer to Care Plan for Long Term Goals  Recommendations for other services: Neuropsych  Discharge Criteria: Patient will be discharged from PT if patient refuses treatment 3 consecutive times without medical reason, if treatment goals not met, if there is a change in medical status, if patient makes no progress towards goals or if patient is discharged from hospital.  The above assessment, treatment plan, treatment alternatives and goals were discussed and mutually agreed upon: by patient  Lorie Phenix 11/26/2018, 9:03 AM

## 2018-11-26 NOTE — Progress Notes (Signed)
[facility] PHYSICAL MEDICINE & REHABILITATION PROGRESS NOTE   Subjective/Complaints:  Pt denies any breathing problems, no dizziness in bed , no abd pain Voiding into condom cath   ROS- neg CP SOB , N/V/D  Objective:   No results found. Recent Labs    11/24/18 1300 11/26/18 0512  WBC 8.8 8.2  HGB 7.6* 7.2*  HCT 24.1* 23.9*  PLT 225 214   Recent Labs    11/24/18 1300 11/26/18 0512  NA 138 138  K 3.6 3.6  CL 101 103  CO2 26 26  GLUCOSE 92 93  BUN 17 22*  CREATININE 0.95 1.00  CALCIUM 8.5* 8.1*    Intake/Output Summary (Last 24 hours) at 11/26/2018 0707 Last data filed at 11/26/2018 0526 Gross per 24 hour  Intake 240 ml  Output 800 ml  Net -560 ml     Physical Exam: Vital Signs Blood pressure 113/78, pulse 78, temperature 98 F (36.7 C), temperature source Oral, resp. rate 18, height 6' 0.4" (1.839 m), weight 118.6 kg, SpO2 95 %.   General: No acute distress Mood and affect are appropriate Heart: Regular rate and rhythm no rubs murmurs or extra sounds Lungs: Clear to auscultation, breathing unlabored, no rales or wheezes Abdomen: Positive bowel sounds, soft nontender to palpation, nondistended Extremities: No clubbing, cyanosis, or edema Skin: No evidence of breakdown, no evidence of rash Neurologic: Cranial nerves II through XII intact, motor strength is 5/5 in bilateral deltoid, bicep, tricep, grip, hip flexor, knee extensors, ankle dorsiflexor and plantar flexor Sensory exam normal sensation to light touch and proprioception in bilateral upper and lower extremities Cerebellar exam normal finger to nose to finger as well as heel to shin in bilateral upper and lower extremities Musculoskeletal: Full range of motion in all 4 extremities. No joint swelling   Assessment/Plan: 1. Functional deficits secondary to debility secondary to CHF and empyema which require 3+ hours per day of interdisciplinary therapy in a comprehensive inpatient rehab  setting.  Physiatrist is providing close team supervision and 24 hour management of active medical problems listed below.  Physiatrist and rehab team continue to assess barriers to discharge/monitor patient progress toward functional and medical goals  Care Tool:  Bathing              Bathing assist       Upper Body Dressing/Undressing Upper body dressing   What is the patient wearing?: Hospital gown only    Upper body assist Assist Level: Minimal Assistance - Patient > 75%    Lower Body Dressing/Undressing Lower body dressing      What is the patient wearing?: Incontinence brief     Lower body assist Assist for lower body dressing: 2 Helpers     Toileting Toileting    Toileting assist Assist for toileting: 2 Helpers     Transfers Chair/bed transfer  Transfers assist  Chair/bed transfer activity did not occur: Safety/medical concerns        Locomotion Ambulation   Ambulation assist              Walk 10 feet activity   Assist           Walk 50 feet activity   Assist           Walk 150 feet activity   Assist           Walk 10 feet on uneven surface  activity   Assist           Wheelchair  Assist               Wheelchair 50 feet with 2 turns activity    Assist            Wheelchair 150 feet activity     Assist          Medical Problem List and Plan: 1.  Deficits with mobility, transfers, endurance, weakness secondary to multifactorial debility.          CIRPT, OT, Evals 2.  Antithrombotics: -DVT/anticoagulation:  Pharmaceutical: Other (comment)--Eliquis. Continue to monitor hemoglobin as well as signs of bleeding.             -antiplatelet therapy: N/A 3. Pain Management: Tylenol prn 4. [place]: LCSW to follow for evaluate              -antipsychotic agents: N/A 5. Neuropsych: This patient is capable of making decisions on his own behalf. 6. Skin/Wound Care: Routine pressure  relief measures.  7. Fluids/Electrolytes/Nutrition: Monitor I/O. Check BMP tomorrow a.m.  Continue ensure bid.  8. Empyema due to E coli: Continue ampicillin --Antibiotic day # 11/28.  CT chest prior to d/c antibiotics? 9. ABLA due to GIB/spontaneous thigh hematoma: Continue protonix bid. Received 1 unit PRBC 4/24 and feraheme 4/26.   Now on iron supplement. Add folic acid due to low folate levels.  CBC ordered for tomorrow a.m. 10. ICM/Acute systolic CHF: Monitor daily weights.  Monitor for signs/symptoms of fluid overload.  BP remains soft--no ARB. Continue lasix, coreg and Crestor.  11. 3 vessel CAD: Not a surgical candidate-treated medically 12. Paroxymal A Fib: Monitor HR bid-continue amiodarone, coreg and Eliquis. Cards recommends re-eval prior to discharge.  13. Bradycardia: Heart rate improving with decrease in Coreg and d/c of digoxin.  Monitor with increased physical activity. 14. Protein calorie malnutrition: Continue to offer supplements tid. Magnesium supplemented yesterday due to low normal levels. Will add Mg supplement..  15.Orthostatic hypotension: Keep HOB > 30 degrees. Continue Unna boots for edema control and BP support.  Abdominal binder if needed   Given multiple medical issues will ask [facility]  IM to consult  LOS: 1 days A FACE TO FACE EVALUATION WAS PERFORMED  Aaron [name] 11/26/2018, 7:07 AM

## 2018-11-26 NOTE — Progress Notes (Signed)
Lucy Chris, LCSW  Social Worker  Physical Medicine and Rehabilitation  Progress Notes  Addendum  Date of Service:  11/06/2018 12:35 PM       Related encounter: Admission (Discharged) from 11/05/2018 in [facility] 4W [facility] CENTER A        Show:Clear all [x] Manual[x] Template[] Copied  Added by: [x] Wilmont Olund, Lemar Livings, LCSW  [] Hover for details Social Work  Social Work Assessment and Plan   Patient Details  Name: Aaron [name] MRN: [id number] Date of Birth: 1966/04/17   Today's Date: 11/06/2018   Problem List:      Patient Active Problem List    Diagnosis Date Noted  . Debility 11/05/2018  . Pressure injury of skin 11/04/2018  . Iron deficiency anemia    . Orthostatic hypotension    . Hematoma of right thigh 10/30/2018  . Acute blood loss anemia 10/30/2018  . Tobacco abuse 10/30/2018  . Gastritis with bleeding 10/30/2018  . Esophagitis, [place] grade C 10/30/2018  . Protein-calorie malnutrition, severe 10/26/2018  . Hypokalemia    . Atrial fibrillation with rapid ventricular response (HCC) 10/12/2018  . Morbid obesity (HCC) 10/09/2018  . Acute systolic CHF (congestive heart failure) (HCC) 10/02/2018  . Acute hypoxemic respiratory failure (HCC) 10/02/2018  . Anasarca 10/01/2018  . AKI (acute kidney injury) (HCC) 10/01/2018    Past Medical History:      Past Medical History:  Diagnosis Date  . Morbid obesity (HCC)      Past Surgical History:       Past Surgical History:  Procedure Laterality Date  . ESOPHAGOGASTRODUODENOSCOPY (EGD) WITH PROPOFOL N/A 10/18/2018    Procedure: ESOPHAGOGASTRODUODENOSCOPY (EGD) WITH PROPOFOL;  Surgeon: Kerin Salen, MD;  Location: [facility] ENDOSCOPY;  Service: Gastroenterology;  Laterality: N/A;  . RIGHT/LEFT HEART CATH AND CORONARY ANGIOGRAPHY N/A 10/09/2018    Procedure: RIGHT/LEFT HEART CATH AND CORONARY ANGIOGRAPHY;  Surgeon: Iran Ouch, MD;  Location: [facility] INVASIVE CV LAB;  Service: Cardiovascular;   Laterality: N/A;    Social History:  reports that he has never smoked. He has quit using smokeless tobacco.  His smokeless tobacco use included snuff. He reports that he does not drink alcohol or use drugs.   Family / Support Systems Marital Status: Married Patient Roles: Spouse, Parent Spouse/Significant Other: Rinaldo Cloud [phone] Children: Scott-son [phone] Other Supports: Extended family and friends Anticipated Caregiver: [name] and adult son's Ability/Limitations of Caregiver: Elita Quick is a paramedic who works nights but will have son's and other family members assist Caregiver Availability: 24/7 Family Dynamics: Close knit family member who will do for one another, they have always done this and will provide the care pt needs at home. Pt feels he is in good hands at home.   Social History Preferred language: English Religion: Lutheran Cultural Background: No issues Education: Paramedic school Read: Yes Write: Yes Employment Status: (applying for SSD-sent in) Marine scientist Issues: No issues Guardian/Conservator: None-according to MD pt is capable of making his own decisions while here, he does want his wife included in any decision while here    Abuse/Neglect Abuse/Neglect Assessment Can Be Completed: Yes Physical Abuse: Denies Verbal Abuse: Denies Sexual Abuse: Denies Exploitation of patient/patient's resources: Denies Self-Neglect: Denies   Emotional Status Pt's affect, behavior and adjustment status: Pt is motivated to do well and recover from his health issues-he is very deconditioned and needs rest breaks throughout the therapy day. Wife voiced he has always been independent and will do what ever is needed to regain this.  Recent  Psychosocial Issues: mutliple health issues while here Psychiatric History: No history deferred depression at this time due to will allow to adjust to the new unit and get stronger, but do feel he would benefit from seeing  neuro-psych while here due to long hospitalization and for coping Substance Abuse History: No issues   Patient / Family Perceptions, Expectations & Goals Pt/Family understanding of illness & functional limitations: Pt and wife have a good understanding of his treatment plan and condition due to both of them are paramedics. They do talk with the MD daily and feel their questions are answered and addressed. Premorbid pt/family roles/activities: Husband, father, friend, church member, etc Anticipated changes in roles/activities/participation: resume Pt/family expectations/goals: Pt states: " I want to do for myself if I can before I leave here."  Wife states: " Our fmaily will do whatever he needs, but know he wants to do for himself."   [employer]: None Premorbid Home Care/DME Agencies: Other (Comment)(rw and bsc) Transportation available at discharge: Family  Resource referrals recommended: Neuropsychology, Support group (specify)   Discharge Planning Living Arrangements: Spouse/significant other Support Systems: Spouse/significant other, Children, Friends/neighbors, Church/faith community Type of Residence: Private residence Insurance Resources: Self-pay(Pending Medicaid) Financial Resources: Family Support Financial Screen Referred: Yes Living Expenses: Lives with family Money Management: Patient, Spouse Does the patient have any problems obtaining your medications?: No Home Management: Pt was managing the home prior to admission, now wife and son's will do this Patient/Family Preliminary Plans: Return home with wife and son's assisting if needed. Wife works at night so one of their son's can be there then and while she sleeps. Will await therpay team evaluations and work on discharge needs. Social Work Anticipated Follow Up Needs: HH/OP, Support Group   Clinical Impression Pleasant severely deconditioned gentleman who will need rest breaks in between his  therapies. Wife has applied for SSD and Medicaid for him. Family is very involved and will provide 24 hr care if needed at discharge. Do feel he would benefit from seeing neuro-psych while here. Will make referral to be seen next week.    Lucy Chris 11/06/2018, 12:35 PM      Revision History

## 2018-11-26 NOTE — Care Management Note (Signed)
Inpatient Rehabilitation Center Individual Statement of Services  Patient Name:  Aaron [name]  Date:  11/26/2018  Welcome to the Inpatient Rehabilitation Center.  Our goal is to provide you with an individualized program based on your diagnosis and situation, designed to meet your specific needs.  With this comprehensive rehabilitation program, you will be expected to participate in at least 3 hours of rehabilitation therapies Monday-Friday, with modified therapy programming on the weekends.  Your rehabilitation program will include the following services:  Physical Therapy (PT), Occupational Therapy (OT), 24 hour per day rehabilitation nursing, Therapeutic Recreaction (TR), Neuropsychology, Case Management (Social Worker), Rehabilitation Medicine, Nutrition Services and Pharmacy Services  Weekly team conferences will be held on Wednesday to discuss your progress.  Your Social Worker will talk with you frequently to get your input and to update you on team discussions.  Team conferences with you and your family in attendance may also be held.  Expected length of stay: 25-28 days  Overall anticipated outcome: min with transfers/ADL's-max for ambulation  Depending on your progress and recovery, your program may change. Your Social Worker will coordinate services and will keep you informed of any changes. Your Social Worker's name and contact numbers are listed  below.  The following services may also be recommended but are not provided by the Inpatient Rehabilitation Center:   Driving Evaluations  Home Health Rehabiltiation Services  Outpatient Rehabilitation Services    Arrangements will be made to provide these services after discharge if needed.  Arrangements include referral to agencies that provide these services.  Your insurance has been verified to be:  Self Pay Your primary doctor is:  [facility]  Pertinent information will be shared with your doctor and your insurance  company.  Social Worker:  Dossie Der, SW [phone] or [phone]  Information discussed with and copy given to patient by: Lucy Chris, 11/26/2018, 2:12 PM

## 2018-11-26 NOTE — Progress Notes (Signed)
Inpatient Rehabilitation  Patient information reviewed and entered into eRehab system by Zaydrian Batta M. Roey Coopman, M.A., CCC/SLP, PPS Coordinator.  Information including medical coding, functional ability and quality indicators will be reviewed and updated through discharge.    

## 2018-11-27 ENCOUNTER — Inpatient Hospital Stay (HOSPITAL_COMMUNITY): Payer: Self-pay | Admitting: Occupational Therapy

## 2018-11-27 ENCOUNTER — Inpatient Hospital Stay (HOSPITAL_COMMUNITY): Payer: Self-pay

## 2018-11-27 ENCOUNTER — Inpatient Hospital Stay (HOSPITAL_COMMUNITY): Payer: Self-pay | Admitting: Physical Therapy

## 2018-11-27 NOTE — IPOC Note (Addendum)
Overall Plan of Care [facility]) Patient Details Name: Aaron [name] MRN: [id number] DOB: 1966/04/22  Admitting Diagnosis: <principal problem not specified>  Hospital Problems: Active Problems:   Debility     Functional Problem List: Nursing Bladder, Bowel, Edema, Endurance, Motor, Nutrition, Pain, Safety, Skin Integrity  PT Balance, Edema, Endurance, Motor, Safety, Sensory, Pain, Skin Integrity  OT Balance, Edema, Safety, Endurance, Sensory, Skin Integrity, Motor  SLP    TR         Basic ADL's: OT Grooming, Bathing, Dressing, Toileting     Advanced  ADL's: OT       Transfers: PT Bed Mobility, Bed to Chair, Furniture, Floor, Other (comment)  OT Toilet, Tub/Shower     Locomotion: PT Ambulation, Wheelchair Mobility, Stairs     Additional Impairments: OT None  SLP        TR      Anticipated Outcomes Item Anticipated Outcome  Self Feeding no goal  Swallowing      Basic self-care  supervision - Min A  Toileting  Min A   Bathroom Transfers Min A  Bowel/Bladder  maintain regular pattern of emptying bowel and bladder  Transfers  Min A with LRAD   Locomotion  Supervision assist WC level.   Communication     Cognition     Pain  no pain or less than 3  Safety/Judgment  no falls, infection, skin breakdown   Therapy Plan: PT Intensity: Minimum of 1-2 x/day ,45 to 90 minutes PT Frequency: 5 out of 7 days PT Duration Estimated Length of Stay: 25-28 days  OT Intensity: Minimum of 1-2 x/day, 45 to 90 minutes OT Frequency: 5 out of 7 days OT Duration/Estimated Length of Stay: 20-21 days     Due to the current state of emergency, patients may not be receiving their 3-hours of Medicare-mandated therapy.   Team Interventions: Nursing Interventions Patient/Family Education, Bowel Management, Skin Care/Wound Management, Bladder Management, Psychosocial Support, Disease Management/Prevention, Discharge Planning  PT interventions Ambulation/gait training,  Balance/vestibular training, Discharge planning, Community reintegration, Disease management/prevention, DME/adaptive equipment instruction, Functional electrical stimulation, Functional mobility training, Patient/family education, Pain management, Neuromuscular re-education, Psychosocial support, Skin care/wound management, Splinting/orthotics, Therapeutic Exercise, Therapeutic Activities, Stair training, UE/LE Coordination activities, UE/LE Strength taining/ROM, Visual/perceptual remediation/compensation, Wheelchair propulsion/positioning  OT Interventions Warden/ranger, Community reintegration, Disease mangement/prevention, Neuromuscular re-education, Equities trader education, Self Care/advanced ADL retraining, Therapeutic Exercise, UE/LE Coordination activities, Wheelchair propulsion/positioning, UE/LE Strength taining/ROM, Therapeutic Activities, Psychosocial support, Pain management, Functional mobility training, DME/adaptive equipment instruction, Skin care/wound managment, Discharge planning, Cognitive remediation/compensation  SLP Interventions    TR Interventions    SW/CM Interventions Discharge Planning, Psychosocial Support, Patient/Family Education   Barriers to Discharge MD  Medical stability and Weight  Nursing Medical stability    PT Inaccessible home environment, Decreased caregiver support, Home environment Best boy, Insurance for SNF coverage, Weight bearing restrictions    OT Medical stability, Weight, New oxygen, Wound Care    SLP      SW       Team Discharge Planning: Destination: PT-Home ,OT- Home , SLP-  Projected Follow-up: PT-Home health PT, OT-  Home health OT, SLP-  Projected Equipment Needs: PT-Wheelchair (measurements), Wheelchair cushion (measurements), Rolling walker with 5" wheels, To be determined, Sliding board, OT- To be determined, SLP-  Equipment Details: PT- , OT-  Patient/family involved in discharge planning: PT- Patient,   OT-Patient, SLP-   MD ELOS: 20-24d Medical Rehab Prognosis:  Fair Assessment:  53 year old male without significant medical history who was originally  admitted on 10/01/2018 with 3-4 weeks history of progressive edema history taken from chart review and patient.  Patient had edema in bilateral lower extremities along with shortness of breath and difficulty with ambulation.  He was found to have fluid overload with anasarca, ascites, AKI, and abnormal LFTs. 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  He was not felt to be a good surgical candidate. Hospital course complicated by A fib with RVR but patient developed spontaneous right thigh hematoma 3/18 as well as GIB due to esophageal ulcers with gastritis and esophagitis on 3/22. He has also had issue with abdominal pain due to ileus Tx with  NGT, A fib with NSVT requiring amiodarone for rate control,  hyptension reated with NGT, acute systolic CHF as well as issues with hypotension requiring tilt table for standing. (See prior H&P for full details)  He was admitted to [facility] 11/05/2018 due to debility and required supplemental oxygen due to DOE. On 04/16, he reported productive cough as well as increase in SOB. CXR done revealing concerns of hydro or hemothorax.  CT chest ordered for work up and revealed right sided empyema with complete collapse of RLE and partial collapse of RML/RUL with reactive mediastinal lymph nodes.  CT surgery and TRH were consulted for assistance and he was discharged to acute hospital for monitoring and treatment. Dr. Dorris Fetch recommended VIR chest tube placement. He underwent drainage of 800 cc thick brown purulent appearing fluid on 4/16 with placement of PleurEvac and continued to have drainage.  He was started on Vanc/Zosyn and cultures positive for E coli therefore narrowed to ampicillin on 11/15/2018.  Chest tube placed to water seal due to air leak and kept in place due to ongoing drainage.  On 4/23, he developed bradycardia with  HR in 30's therefore digoxin discontinued and coreg decreased 4/26 due to ongoing issues. He was started on trial of clamping, received thrombolytics 11/22/2018 and chest tube removed on 4/27.  Follow up CXR with stable right residual empyema without pneumothorax. Dr. Dorris Fetch recommends "probably 4 weeks antibiotic regimen" to treat empyema. He had drop in H/H to 6.4/21.2 on 4/24 and received 1 units PRBCs.  Anemia of chronic disease supplemented with feraheme 4/26   Now requiring 24/7 Rehab RN,MD, as well as CIR level PT, OT and SLP.  Treatment team will focus on ADLs and mobility with goals set at MinA/Sup See Team Conference Notes for weekly updates to the plan of care

## 2018-11-27 NOTE — Progress Notes (Signed)
[place] PHYSICAL MEDICINE & REHABILITATION PROGRESS NOTE   Subjective/Complaints:  No breathing issues  ROS- neg CP SOB , N/V/D  Objective:   No results found. Recent Labs    11/24/18 1300 11/26/18 0512  WBC 8.8 8.2  HGB 7.6* 7.2*  HCT 24.1* 23.9*  PLT 225 214   Recent Labs    11/24/18 1300 11/26/18 0512  NA 138 138  K 3.6 3.6  CL 101 103  CO2 26 26  GLUCOSE 92 93  BUN 17 22*  CREATININE 0.95 1.00  CALCIUM 8.5* 8.1*    Intake/Output Summary (Last 24 hours) at 11/27/2018 0930 Last data filed at 11/27/2018 0034 Gross per 24 hour  Intake 406.28 ml  Output 950 ml  Net -543.72 ml     Physical Exam: Vital Signs Blood pressure 106/81, pulse (!) 111, temperature 98.8 F (37.1 C), temperature source Oral, resp. rate 19, height 6' 0.4" (1.839 m), weight 120.6 kg, SpO2 97 %.   General: No acute distress Mood and affect are appropriate Heart: Regular rate and rhythm no rubs murmurs or extra sounds Lungs: Clear to auscultation, breathing unlabored, no rales or wheezes Abdomen: Positive bowel sounds, soft nontender to palpation, nondistended Extremities: No clubbing, cyanosis, or edema Skin: No evidence of breakdown, no evidence of rash Neurologic: Cranial nerves II through XII intact, motor strength is 4/5 in bilateral deltoid, bicep, tricep, grip,3-/5 hip flexor, knee extensors, ankle dorsiflexor and plantar flexor Sensory exam normal sensation to light touch and proprioception in bilateral upper and lower extremities Cerebellar exam normal finger to nose to finger as well as heel to shin in bilateral upper and lower extremities Musculoskeletal: Full range of motion in all 4 extremities. No joint swelling   Assessment/Plan: 1. Functional deficits secondary to debility secondary to CHF and empyema which require 3+ hours per day of interdisciplinary therapy in a comprehensive inpatient rehab setting.  Physiatrist is providing close team supervision and 24 hour  management of active medical problems listed below.  Physiatrist and rehab team continue to assess barriers to discharge/monitor patient progress toward functional and medical goals  Care Tool:  Bathing    Body parts bathed by patient: Right arm, Left arm, Chest, Abdomen, Front perineal area, Face   Body parts bathed by helper: Buttocks, Right upper leg, Left upper leg Body parts n/a: Right lower leg, Left lower leg(lower extremities wrapped)   Bathing assist Assist Level: Maximal Assistance - Patient 24 - 49%     Upper Body Dressing/Undressing Upper body dressing   What is the patient wearing?: Hospital gown only    Upper body assist Assist Level: Minimal Assistance - Patient > 75%    Lower Body Dressing/Undressing Lower body dressing      What is the patient wearing?: Incontinence brief     Lower body assist Assist for lower body dressing: 2 Helpers     Toileting Toileting    Toileting assist Assist for toileting: Total Assistance - Patient < 25%(bed pan)     Transfers Chair/bed transfer  Transfers assist  Chair/bed transfer activity did not occur: Safety/medical concerns        Locomotion Ambulation   Ambulation assist   Ambulation activity did not occur: Safety/medical concerns          Walk 10 feet activity   Assist  Walk 10 feet activity did not occur: Safety/medical concerns        Walk 50 feet activity   Assist Walk 50 feet with 2 turns activity did  not occur: Safety/medical concerns         Walk 150 feet activity   Assist Walk 150 feet activity did not occur: Safety/medical concerns         Walk 10 feet on uneven surface  activity   Assist Walk 10 feet on uneven surfaces activity did not occur: Safety/medical concerns         Wheelchair     Assist Will patient use wheelchair at discharge?: No   Wheelchair activity did not occur: Safety/medical concerns         Wheelchair 50 feet with 2 turns  activity    Assist    Wheelchair 50 feet with 2 turns activity did not occur: Safety/medical concerns       Wheelchair 150 feet activity     Assist Wheelchair 150 feet activity did not occur: Safety/medical concerns        Medical Problem List and Plan: 1.  Deficits with mobility, transfers, endurance, weakness secondary to multifactorial debility.          CIR PT, OT, Evals 2.  Antithrombotics: -DVT/anticoagulation:  Pharmaceutical: Other (comment)--Eliquis. Continue to monitor hemoglobin as well as signs of bleeding.             -antiplatelet therapy: N/A 3. Pain Management: Tylenol prn 4. Mood: LCSW to follow for evaluate              -antipsychotic agents: N/A 5. Neuropsych: This patient is capable of making decisions on his own behalf. 6. Skin/Wound Care: Routine pressure relief measures.  7. Fluids/Electrolytes/Nutrition: Monitor I/O. Check BMP tomorrow a.m.  Continue ensure bid.  8. Empyema due to E coli: Continue ampicillin --Antibiotic day # 12/28.  CT chest prior to d/c antibiotics? 9. ABLA due to GIB/spontaneous thigh hematoma: Continue protonix bid. Received 1 unit PRBC 4/24 and feraheme 4/26.   Now on iron supplement. Add folic acid due to low folate levels.  CBC ordered for tomorrow a.m. 10. ICM/Acute systolic CHF: Monitor daily weights.  Monitor for signs/symptoms of fluid overload.  BP remains soft--no ARB. Continue lasix, coreg and Crestor.  11. 3 vessel CAD: Not a surgical candidate-treated medically 12. Paroxymal A Fib: Monitor HR bid-continue amiodarone, coreg and Eliquis. Cards recommends re-eval prior to discharge.   13. Bradycardia: Heart rate improving with decrease in Coreg and d/c of digoxin.  Monitor with increased physical activity.  14. Protein calorie malnutrition: Continue to offer supplements tid. Magnesium supplemented yesterday due to low normal levels. Will add Mg supplement.. Variable intake  15.Orthostatic hypotension: Keep HOB > 30  degrees. Continue Unna boots for edema control and BP support.  Abdominal binder if needed   appreciate [facility]  IM  consult  LOS: 2 days A FACE TO FACE EVALUATION WAS PERFORMED  Erick Colace 11/27/2018, 9:30 AM

## 2018-11-27 NOTE — Progress Notes (Signed)
Occupational Therapy Session Note  Patient Details  Name: Aaron [name] MRN: [id number] Date of Birth: 1966-05-08  Today's Date: 11/27/2018 OT Individual Time: 7544-9201 OT Individual Time Calculation (min): 59 min   Short Term Goals: Week 1:  OT Short Term Goal 1 (Week 1): Pt will complete BSC transfer with 2 assist  OT Short Term Goal 2 (Week 1): Pt will complete LB dressing sit<stand with 2 assist using LRAD to work on standing tolerance  OT Short Term Goal 3 (Week 1): Pt will complete 1/3 components of donning pants using AE as needed   Skilled Therapeutic Interventions/Progress Updates:    Pt greeted in bed with no c/o pain. BP monitored during tx with readings listed below. Asymptomatic for orthostatics throughout session. Pt completed UB/LB bathing bedlevel with overall Min A excluding perihygiene (he reported Engineer, manufacturing assisted with perihygiene prior to OT, and he opted to not complete again). Placed bed in chair position during oral care and when donning clean hospital gown. Maximove transfer to TIS completed dependently. He was reclined to 20 degrees for BP mgt and comfort. Due to MD order of only needing to wear bledsoe brace when ambulating, it was not donned during lift transfer. He was left with all needs within reach and safety belt fastened. Tx focus placed on activity tolerance and upright tolerance while maintaining stable vitals.   BP while supine with HOB raised: 105/68 BP after UB bathing in same position: 97/73 BP after a few minutes of chair position: 95/77 BP post transfer to TIS, reclined to 20 degrees: 110/70 (02 sats 95-96%) Educated pt to let RN staff know if he had symptoms of orthostatics for transferring back to bed.    Therapy Documentation Precautions:  Precautions Precautions: Fall Precaution Comments: monitor BP, hypotensive in sitting/standing Required Braces or Orthoses: Other Brace Other Brace: RLE Bledsoe brace locked in extension when ambulating that  can be d/c'd when pt can perform 10 SLRs without assist Restrictions Weight Bearing Restrictions: No RLE Weight Bearing: Weight bearing as tolerated LLE Weight Bearing: Weight bearing as tolerated Pain: No c/o pain during tx    ADL:       Therapy/Group: Individual Therapy  Corie Vavra A [place] Hert 11/27/2018, 2:40 PM

## 2018-11-27 NOTE — Progress Notes (Signed)
PROGRESS NOTE    Llewyn Buonocore   VQM:[id number]  DOB: 1966-06-19  DOA: 11/25/2018 PCP: [facility] [place]   Brief Narrative:  Ardel Sloan is an 53 y.o. male with morbid obesity; CAD (not a candidate for CABG); afib; and acute systolic CHF.  He was admitted from 3/5-4/9 for acute systolic CHF with EF 10-15% and anasarca; he developed an upper GI bleed while hospitalized requiring 1 unit PRBC.  He was discharged to CIR but developed SOB again on 4/16 and was transferred back to inpatient. He was found to have a  R-sided empyema that grew E. Coli, for which he was recommended to be treated with Ampicillin through 5/7.   He was discharged back to CIR on 4/29. [facility] is following for medical management.   Subjective: He feels well and admits to    Assessment & Plan:   Active Problems:   Debility - management per rehab staff  Right e coli empyema - cont Ampicillin until 5/7  Chronic systolic CHF with EF of 15 % - cont current medications and follow fluid status closely  A-fib - on Coreg and Amiodarone for rate control - cont Eliquis  CAD - medical management - not a candidate for CABG   Pedal edema - currently in UNNA boots which are to be changed on Thursdays  Time spent in minutes: 35 min DVT prophylaxis: Eliquis    Antimicrobials:  Anti-infectives (From admission, onward)   Start     Dose/Rate Route Frequency Ordered Stop   11/25/18 1800  ampicillin (OMNIPEN) 2 g in sodium chloride 0.9 % 100 mL IVPB     2 g 300 mL/hr over 20 Minutes Intravenous Every 6 hours 11/25/18 1552         Objective: Vitals:   11/26/18 1522 11/26/18 2016 11/27/18 0625 11/27/18 0627  BP: (!) 102/56 97/72  106/81  Pulse: 72 (!) 126  (!) 111  Resp: 18 19  19   Temp: 98.4 F (36.9 C) 98.7 F (37.1 C)  98.8 F (37.1 C)  TempSrc: Oral Oral  Oral  SpO2: 99% 97%    Weight:   120.6 kg   Height:        Intake/Output Summary (Last 24 hours) at 11/27/2018  1418 Last data filed at 11/27/2018 1252 Gross per 24 hour  Intake 826.28 ml  Output 1200 ml  Net -373.72 ml   Filed Weights   11/25/18 1702 11/26/18 0525 11/27/18 0625  Weight: 118.6 kg 119.2 kg 120.6 kg    Examination: General exam: Appears comfortable  HEENT: PERRLA, oral mucosa moist, no sclera icterus or thrush Respiratory system: Clear to auscultation. Respiratory effort normal. Cardiovascular system: S1 & S2 heard, RRR.   Gastrointestinal system: Abdomen soft, non-tender, nondistended. Normal bowel sounds. Central nervous system: Alert and oriented. No focal neurological deficits. Extremities: No cyanosis, clubbing or edema- UNNA boots present Skin: No rashes or ulcers Psychiatry:  Mood & affect appropriate.     Data Reviewed: I have personally reviewed following labs and imaging studies  CBC: Recent Labs  Lab 11/21/18 0348 11/22/18 0323 11/24/18 1300 11/26/18 0512  WBC  --  9.0 8.8 8.2  NEUTROABS  --  6.7 6.8 6.2  HGB 7.5* 7.6* 7.6* 7.2*  HCT 24.3* 24.9* 24.1* 23.9*  MCV  --  95.4 94.9 99.6  PLT  --  241 225 214   Basic Metabolic Panel: Recent Labs  Lab 11/22/18 0323 11/24/18 0459 11/24/18 1300 11/26/18 0512  NA 138  --  138 138  K 3.8  --  3.6 3.6  CL 105  --  101 103  CO2 26  --  26 26  GLUCOSE 93  --  92 93  BUN 18  --  17 22*  CREATININE 0.99  --  0.95 1.00  CALCIUM 8.2*  --  8.5* 8.1*  MG  --  1.8  --  2.3   GFR: Estimated Creatinine Clearance: 113.8 mL/min (by C-G formula based on SCr of 1 mg/dL). Liver Function Tests: Recent Labs  Lab 11/24/18 1300 11/26/18 0512  AST 20 20  ALT 25 22  ALKPHOS 89 82  BILITOT 0.9 0.8  PROT 6.0* 7.5  ALBUMIN 2.0* 1.9*   No results for input(s): LIPASE, AMYLASE in the last 168 hours. No results for input(s): AMMONIA in the last 168 hours. Coagulation Profile: No results for input(s): INR, PROTIME in the last 168 hours. Cardiac Enzymes: No results for input(s): CKTOTAL, CKMB, CKMBINDEX, TROPONINI in  the last 168 hours. BNP (last 3 results) No results for input(s): PROBNP in the last 8760 hours. HbA1C: No results for input(s): HGBA1C in the last 72 hours. CBG: No results for input(s): GLUCAP in the last 168 hours. Lipid Profile: No results for input(s): CHOL, HDL, LDLCALC, TRIG, CHOLHDL, LDLDIRECT in the last 72 hours. Thyroid Function Tests: No results for input(s): TSH, T4TOTAL, FREET4, T3FREE, THYROIDAB in the last 72 hours. Anemia Panel: No results for input(s): VITAMINB12, FOLATE, FERRITIN, TIBC, IRON, RETICCTPCT in the last 72 hours. Urine analysis:    Component Value Date/Time   COLORURINE AMBER (A) 10/01/2018 1523   APPEARANCEUR HAZY (A) 10/01/2018 1523   LABSPEC 1.023 10/01/2018 1523   PHURINE 5.0 10/01/2018 1523   GLUCOSEU NEGATIVE 10/01/2018 1523   HGBUR SMALL (A) 10/01/2018 1523   BILIRUBINUR NEGATIVE 10/01/2018 1523   KETONESUR NEGATIVE 10/01/2018 1523   PROTEINUR NEGATIVE 10/01/2018 1523   NITRITE NEGATIVE 10/01/2018 1523   LEUKOCYTESUR NEGATIVE 10/01/2018 1523   Sepsis Labs: @LABRCNTIP (procalcitonin:4,lacticidven:4) )No results found for this or any previous visit (from the past 240 hour(s)).       Radiology Studies: No results found.    Scheduled Meds:  amiodarone  200 mg Oral Daily   apixaban  5 mg Oral BID   carvedilol  3.125 mg Oral Daily   feeding supplement (ENSURE ENLIVE)  237 mL Oral TID BM   feeding supplement (PRO-STAT SUGAR FREE 64)  30 mL Oral BID   ferrous sulfate  325 mg Oral TID WC   folic acid  1 mg Oral Daily   furosemide  40 mg Oral Daily   magnesium oxide  400 mg Oral BID   pantoprazole  40 mg Oral BID AC   polyethylene glycol  17 g Oral BID   rosuvastatin  20 mg Oral q1800   Continuous Infusions:  sodium chloride 250 mL (11/26/18 1716)   ampicillin (OMNIPEN) IV 2 g (11/27/18 1055)     LOS: 2 days      Calvert Cantor, MD [facility] Pager: [email or web address] Password [email or web address] 11/27/2018, 2:18 PM

## 2018-11-27 NOTE — Progress Notes (Signed)
Physical Therapy Session Note  Patient Details  Name: Aaron [name] MRN: [id number] Date of Birth: [birth year]  Today's Date: 11/27/2018 PT Individual Time: 1330-1446 PT Individual Time Calculation (min): 76 min   Short Term Goals: Week 1:  PT Short Term Goal 1 (Week 1): Pt will perform bed mobliity with mod assist  PT Short Term Goal 2 (Week 1): Pt will propell WC with min assist x 12ft.  PT Short Term Goal 3 (Week 1): Pt will perform bed<>WC transfer with max assist +2   Skilled Therapeutic Interventions/Progress Updates:     Patient in bed upon PT arrival. Patient alert and agreeable to PT session. Bledsoe brace donned on R LE at beginning of session.  Therapeutic Activity: Bed Mobility: Patient performed supine to/from sit with min A for LE management with HOB elevated 30 degrees and use of bed rails. Provided verbal cues for sliding feet off before sitting up. Transfers: Patient performed set up for slide board transfer when patient reported feeling dizzy, BP 90/73. Attempted to use the bed to progress to standing tolerance several times and with the assistance of another PT for use of controls. The bed was unable to recognize the patients weight on the foot plate and was not functioning correctly, RN notified.   Neuromuscular Re-ed: Performed sitting balance on EOB reducing from 2 UE support to no UE support for 5 minutes, BP 93/77. He also performed leaning to each side to his elbow x2 with CGA for safety for trunk control while sitting EOB.  Therapeutic Exercise: Patient performed the following exercises with verbal and tactile cues for proper technique.  -B quad sets x5 in supine With bed in chair position: -B shoulder extension holding a towel x10 with inhale on the way up and exhale on the way down -B PNF lifts on the diagonal x10 with inhale on the way up and exhale on the way down x10 each side HR: 107 after exercise, RPE 5-6/10; educated on performing exercises with bed in  chair position independently 1-2x per day for increased breath support, sitting tolerance, and endurance.  Patient in bed at end of session with breaks locked, and all needs within reach. BP 107/80, HR 86 at end of session.   Therapy Documentation Precautions:  Precautions Precautions: Fall Precaution Comments: monitor BP, hypotensive in sitting/standing Required Braces or Orthoses: Other Brace Other Brace: RLE Bledsoe brace locked in extension when ambulating that can be d/c'd when pt can perform 10 SLRs without assist Restrictions Weight Bearing Restrictions: No RLE Weight Bearing: Weight bearing as tolerated LLE Weight Bearing: Weight bearing as tolerated Pain: Pain Assessment Pain Scale: 0-10 Pain Score: 0-No pain Faces Pain Scale: No hurt    Therapy/Group: Individual Therapy  Jaydon Soroka L Breella Vanostrand PT, DPT  11/27/2018, 4:04 PM

## 2018-11-27 NOTE — Progress Notes (Signed)
Physical Therapy Session Note  Patient Details  Name: Aaron [name] MRN: [id number] Date of Birth: [birth year]  Today's Date: 11/27/2018 PT Individual Time:1015-1115   60 min   Short Term Goals: Week 1:  PT Short Term Goal 1 (Week 1): Pt will perform bed mobliity with mod assist  PT Short Term Goal 2 (Week 1): Pt will propell WC with min assist x 136f.  PT Short Term Goal 3 (Week 1): Pt will perform bed<>WC transfer with max assist +2   Skilled Therapeutic Interventions/Progress Updates:    Pt received sitting in WC and agreeable to PT. Maxi move transfer to bed. PT treatment session focused on orthostatic management and improved WB through BLE through Vital Go tilt bed.   Vital signs assessed throughout treatment.  0 deg: 106/69, HR 95.  30 deg: 93/66, HR r95.  40 deg: 0 min: 92/68, HR 86. 1 min 88/77, HR 87. Pt asymptomatic, but poor positioning and support of knees noted. Returned to supine and tightened straps above and below knees.  Returned to 45 deg. 0 min: 97/62. 3 min: 90/63 mild s/s of orthostasis.  In 40 and 45 deg pt performed press up through BLE 2 x 10 as tolerated. Adequate activation in bil knee extensors to sightly extend knees. Returned to supine and vitals reassessed: 107/69, HR 88. Throughout treatment, pt SpO2 > 95%.   Pt  left supine in bed with call bell in reach and all needs met.       Therapy Documentation Precautions:  Precautions Precautions: Fall Precaution Comments: monitor BP, hypotensive in sitting/standing Required Braces or Orthoses: Other Brace Other Brace: RLE Bledsoe brace locked in extension when ambulating that can be d/c'd when pt can perform 10 SLRs without assist Restrictions Weight Bearing Restrictions: No RLE Weight Bearing: Weight bearing as tolerated LLE Weight Bearing: Weight bearing as tolerated    Vital Signs: Therapy Vitals Temp: 98.8 F (37.1 C) Temp Source: Oral Pulse Rate: (!) 111 Resp: 19 BP: 106/81 Patient Position  (if appropriate): Lying Oxygen Therapy O2 Device: Room Air Pain: Pain Assessment Pain Scale: 0-10 Pain Score: 0-No pain   Therapy/Group: Individual Therapy  ALorie Phenix5/07/2018, 7:56 AM

## 2018-11-28 ENCOUNTER — Inpatient Hospital Stay (HOSPITAL_COMMUNITY): Payer: Self-pay | Admitting: Occupational Therapy

## 2018-11-28 ENCOUNTER — Inpatient Hospital Stay (HOSPITAL_COMMUNITY): Payer: Self-pay | Admitting: Physical Therapy

## 2018-11-28 DIAGNOSIS — J869 Pyothorax without fistula: Secondary | ICD-10-CM

## 2018-11-28 DIAGNOSIS — R799 Abnormal finding of blood chemistry, unspecified: Secondary | ICD-10-CM

## 2018-11-28 DIAGNOSIS — I5021 Acute systolic (congestive) heart failure: Secondary | ICD-10-CM

## 2018-11-28 NOTE — Progress Notes (Signed)
Physical Therapy Session Note  Patient Details  Name: Aaron [name] MRN: [id number] Date of Birth: 08-16-1965  Today's Date: 11/28/2018 PT Individual Time: 0810-0905 PT Individual Time Calculation (min): 55 min   Short Term Goals: Week 1:  PT Short Term Goal 1 (Week 1): Pt will perform bed mobliity with mod assist  PT Short Term Goal 2 (Week 1): Pt will propell WC with min assist x 13f.  PT Short Term Goal 3 (Week 1): Pt will perform bed<>WC transfer with max assist +2   Skilled Therapeutic Interventions/Progress Updates:   Pt received supine in bed and agreeable to PT. Rolling R and L for don pants and place maxi move swing with min assist from PT for full ROM.   [facility] in BLE therex. LAQ, hip flexion, ankle PF, hip abduction. All completed x 12 with AAROM for knee extension. Prolonged rest break between bouts.   Reciprocal scooting in WC. With mod assist from PT and moderate cues for improved set up, lateral weight shift and posture. WC mobility x 1084fwith min assist from PT to prevent veer to the R. SpO2 monitored with exertion. desat  to 87% and increased >94% ofter 30 sec of rest. BP 97/73, HR 93.   Patient returned to room and performed maximove transfer to TIS WC. Pt left sitting in TIS WC with call bell in reach and all needs met.        Therapy Documentation Precautions:  Precautions Precautions: Fall Precaution Comments: monitor BP, hypotensive in sitting/standing Required Braces or Orthoses: Other Brace Other Brace: RLE Bledsoe brace locked in extension when ambulating that can be d/c'd when pt can perform 10 SLRs without assist Restrictions Weight Bearing Restrictions: No RLE Weight Bearing: Weight bearing as tolerated(Bledsoe Brace OOB) LLE Weight Bearing: Weight bearing as tolerated Vital Signs: Therapy Vitals Temp: 98.5 F (36.9 C) Temp Source: Oral Pulse Rate: 93 Resp: 20 BP:  97/73 Patient Position (if appropriate): Sitting Oxygen Therapy SpO2: (!) 87 %(with max exertion ) O2 Device: Room Air Patient Activity (if Appropriate): Other (Comment)(WC propulsion ) Pain: Pain Assessment Pain Scale: 0-10 Pain Score: 0-No pain    Therapy/Group: Individual Therapy  AuLorie Phenix/08/2018, 9:07 AM

## 2018-11-28 NOTE — Progress Notes (Signed)
[facility] PHYSICAL MEDICINE & REHABILITATION PROGRESS NOTE   Subjective/Complaints: Patient seen laying in bed this morning.  He states he slept well overnight.  He notes therapies are going well.  He was seen by hospitalist this AM, notes reviewed.  ROS- Denies CP, SOB, N/V/D  Objective:   No results found. Recent Labs    11/26/18 0512  WBC 8.2  HGB 7.2*  HCT 23.9*  PLT 214   Recent Labs    11/26/18 0512  NA 138  K 3.6  CL 103  CO2 26  GLUCOSE 93  BUN 22*  CREATININE 1.00  CALCIUM 8.1*    Intake/Output Summary (Last 24 hours) at 11/28/2018 1214 Last data filed at 11/28/2018 0800 Gross per 24 hour  Intake 440 ml  Output 725 ml  Net -285 ml     Physical Exam: Vital Signs Blood pressure 97/73, pulse 93, temperature 98.5 F (36.9 C), temperature source Oral, resp. rate 20, height 6' 0.4" (1.839 m), weight 119.4 kg, SpO2 (!) 87 %. Constitutional: No distress . Vital signs reviewed. HENT: Normocephalic.  Atraumatic. Eyes: EOMI. No discharge. Cardiovascular: No JVD. Respiratory: Normal effort. GI: Non-distended. Musc: Edema in bilateral feet Neurologic: Alert Motor: Bilateral upper extremities: 4/5 proximal distal Bilateral lower extremities:  Right lower extremity: Hip flexion 2/5, knee extension 2+/5, ankle dorsiflexion 3/5 Left lower extremity: Hip flexion 3-/5, knee extension 3/5, ankle dorsiflexion 4/5 Skin: Lower extremities and Unna boots  Assessment/Plan: 1. Functional deficits secondary to debility secondary to CHF and empyema which require 3+ hours per day of interdisciplinary therapy in a comprehensive inpatient rehab setting.  Physiatrist is providing close team supervision and 24 hour management of active medical problems listed below.  Physiatrist and rehab team continue to assess barriers to discharge/monitor patient progress toward functional and medical goals  Care Tool:  Bathing    Body parts bathed by patient: Right arm, Left arm, Chest,  Abdomen, Face   Body parts bathed by helper: Right lower leg, Left lower leg Body parts n/a: Front perineal area, Buttocks   Bathing assist Assist Level: Minimal Assistance - Patient > 75%     Upper Body Dressing/Undressing Upper body dressing   What is the patient wearing?: Hospital gown only    Upper body assist Assist Level: Minimal Assistance - Patient > 75%    Lower Body Dressing/Undressing Lower body dressing      What is the patient wearing?: Incontinence brief     Lower body assist Assist for lower body dressing: Maximal Assistance - Patient 25 - 49%     Toileting Toileting    Toileting assist Assist for toileting: Total Assistance - Patient < 25%     Transfers Chair/bed transfer  Transfers assist  Chair/bed transfer activity did not occur: Safety/medical concerns  Chair/bed transfer assist level: Dependent - mechanical lift     Locomotion Ambulation   Ambulation assist   Ambulation activity did not occur: Safety/medical concerns          Walk 10 feet activity   Assist  Walk 10 feet activity did not occur: Safety/medical concerns        Walk 50 feet activity   Assist Walk 50 feet with 2 turns activity did not occur: Safety/medical concerns         Walk 150 feet activity   Assist Walk 150 feet activity did not occur: Safety/medical concerns         Walk 10 feet on uneven surface  activity   Assist Walk  10 feet on uneven surfaces activity did not occur: Safety/medical concerns         Wheelchair     Assist Will patient use wheelchair at discharge?: No   Wheelchair activity did not occur: Safety/medical concerns         Wheelchair 50 feet with 2 turns activity    Assist    Wheelchair 50 feet with 2 turns activity did not occur: Safety/medical concerns       Wheelchair 150 feet activity     Assist Wheelchair 150 feet activity did not occur: Safety/medical concerns        Medical Problem List  and Plan: 1.  Deficits with mobility, transfers, endurance, weakness secondary to multifactorial debility.  Continue CIR 2.  Antithrombotics: -DVT/anticoagulation:  Pharmaceutical: Other (comment)--Eliquis. Continue to monitor hemoglobin as well as signs of bleeding.             -antiplatelet therapy: N/A 3. Pain Management: Tylenol prn 4. Mood: LCSW to follow for evaluate              -antipsychotic agents: N/A 5. Neuropsych: This patient is capable of making decisions on his own behalf. 6. Skin/Wound Care: Routine pressure relief measures.  7. Fluids/Electrolytes/Nutrition: Monitor I/O. Continue ensure bid.   Increasing BUN/creatinine ratio, encourage fluids  Labs ordered for Monday 8. Empyema due to E coli: Continue ampicillin --Antibiotic day # 13/28.  CT chest prior to d/c antibiotics? 9. ABLA due to GIB/spontaneous thigh hematoma: Continue protonix bid. Received 1 unit PRBC 4/24 and feraheme 4/26.   Now on iron supplement.  Added folic acid due to low folate levels.    Hemoglobin 7.2 on 4/30, labs ordered for Monday 10. ICM/Acute systolic CHF: Monitor daily weights.  Monitor for signs/symptoms of fluid overload.  BP remains soft--no ARB. Continue lasix, coreg and Crestor.  Filed Weights   11/26/18 0525 11/27/18 0625 11/28/18 0421  Weight: 119.2 kg 120.6 kg 119.4 kg   Stable on 5/2 11. 3 vessel CAD: Not a surgical candidate-treated medically 12. Paroxymal A Fib with RVR: Monitor HR bid-continue amiodarone, coreg and Eliquis. Cards recommends re-eval prior to discharge.  13. Bradycardia: Heart rate improving with decrease in Coreg and d/c of digoxin.  Monitor with increased physical activity. 14. Protein calorie malnutrition: Continue to offer supplements tid. Magnesium supplemented yesterday due to low normal levels.  Added Mg supplement..  15.Orthostatic hypotension: Keep HOB > 30 degrees. Continue Unna boots for edema control and BP support.  Abdominal binder if needed   LOS: 3  days A FACE TO FACE EVALUATION WAS PERFORMED  Ankit Karis Juba 11/28/2018, 12:14 PM

## 2018-11-28 NOTE — Progress Notes (Signed)
Orthopedic Tech Progress Note Patient Details:  Aaron [name] Aug 06, 1965 [id number]  CPM Left Knee CPM Left Knee: On Left Knee Flexion (Degrees): 90 Left Knee Extension (Degrees): 0 CPM Right Knee CPM Right Knee: On Right Knee Flexion (Degrees): 90 Right Knee Extension (Degrees): 0  Post Interventions Patient Tolerated: Well Instructions Provided: Care of device, Adjustment of device  [name] 11/28/2018, 4:56 PM

## 2018-11-28 NOTE — Progress Notes (Signed)
PROGRESS NOTE    Aaron [name]   MEB:[id number]  DOB: 1965-08-08  DOA: 11/25/2018 PCP: [facility] [place]   Brief Narrative:  Aaron [name] is an 53 y.o. male with morbid obesity; CAD (not a candidate for CABG); afib; and acute systolic CHF.  He was admitted from 3/5-4/9 for acute systolic CHF with EF 10-15% and anasarca; he developed an upper GI bleed while hospitalized requiring 1 unit PRBC.  He was discharged to CIR but developed SOB again on 4/16 and was transferred back to inpatient. He was found to have a  R-sided empyema that grew E. Coli, for which he was recommended to be treated with Ampicillin through 5/7.   He was discharged back to CIR on 4/29. [facility] is following for medical management.   Subjective: Currently in PT. He has no complaints of dyspnea, cough or any other symptoms.     Assessment & Plan:   Active Problems:   Debility - management per rehab staff  Right e coli empyema - cont Ampicillin until 5/7- I have asked for PT to obtain a pulse ox when he exerts himself today  Chronic systolic CHF with EF of 15 % - cont current medications and follow fluid status closely - weight remains stable  A-fib - on Coreg and Amiodarone for rate control - cont Eliquis  CAD - medical management - not a candidate for CABG   Pedal edema - currently in UNNA boots which are to be changed on Thursdays  Time spent in minutes: 25 min DVT prophylaxis: Eliquis  Antimicrobials:  Anti-infectives (From admission, onward)   Start     Dose/Rate Route Frequency Ordered Stop   11/25/18 1800  ampicillin (OMNIPEN) 2 g in sodium chloride 0.9 % 100 mL IVPB     2 g 300 mL/hr over 20 Minutes Intravenous Every 6 hours 11/25/18 1552         Objective: Vitals:   11/27/18 1939 11/28/18 0421 11/28/18 0508 11/28/18 0745  BP: 99/69  (!) 107/59 96/62  Pulse: 86  (!) 135 99  Resp: 19  20   Temp: 99 F (37.2 C)  98.5 F (36.9 C)   TempSrc: Oral   Oral   SpO2: 94%  97%   Weight:  119.4 kg    Height:        Intake/Output Summary (Last 24 hours) at 11/28/2018 0850 Last data filed at 11/28/2018 0800 Gross per 24 hour  Intake 320 ml  Output 725 ml  Net -405 ml   Filed Weights   11/26/18 0525 11/27/18 0625 11/28/18 0421  Weight: 119.2 kg 120.6 kg 119.4 kg    Examination: General exam: Appears comfortable  HEENT: PERRLA, oral mucosa moist, no sclera icterus or thrush Respiratory system: Crackles in Right lower lung fiel. Respiratory effort normal. Cardiovascular system: S1 & S2 heard, RRR.   Gastrointestinal system: Abdomen soft, non-tender, nondistended. Normal bowel sounds. Central nervous system: Alert and oriented. No focal neurological deficits. Extremities: No cyanosis, clubbing or edema- UNNA boots present on both legs Skin: No rashes or ulcers Psychiatry:  Mood & affect appropriate.     Data Reviewed: I have personally reviewed following labs and imaging studies  CBC: Recent Labs  Lab 11/22/18 0323 11/24/18 1300 11/26/18 0512  WBC 9.0 8.8 8.2  NEUTROABS 6.7 6.8 6.2  HGB 7.6* 7.6* 7.2*  HCT 24.9* 24.1* 23.9*  MCV 95.4 94.9 99.6  PLT 241 225 214   Basic Metabolic Panel: Recent Labs  Lab  11/22/18 0323 11/24/18 0459 11/24/18 1300 11/26/18 0512  NA 138  --  138 138  K 3.8  --  3.6 3.6  CL 105  --  101 103  CO2 26  --  26 26  GLUCOSE 93  --  92 93  BUN 18  --  17 22*  CREATININE 0.99  --  0.95 1.00  CALCIUM 8.2*  --  8.5* 8.1*  MG  --  1.8  --  2.3   GFR: Estimated Creatinine Clearance: 113.4 mL/min (by C-G formula based on SCr of 1 mg/dL). Liver Function Tests: Recent Labs  Lab 11/24/18 1300 11/26/18 0512  AST 20 20  ALT 25 22  ALKPHOS 89 82  BILITOT 0.9 0.8  PROT 6.0* 7.5  ALBUMIN 2.0* 1.9*   No results for input(s): LIPASE, AMYLASE in the last 168 hours. No results for input(s): AMMONIA in the last 168 hours. Coagulation Profile: No results for input(s): INR, PROTIME in the last 168  hours. Cardiac Enzymes: No results for input(s): CKTOTAL, CKMB, CKMBINDEX, TROPONINI in the last 168 hours. BNP (last 3 results) No results for input(s): PROBNP in the last 8760 hours. HbA1C: No results for input(s): HGBA1C in the last 72 hours. CBG: No results for input(s): GLUCAP in the last 168 hours. Lipid Profile: No results for input(s): CHOL, HDL, LDLCALC, TRIG, CHOLHDL, LDLDIRECT in the last 72 hours. Thyroid Function Tests: No results for input(s): TSH, T4TOTAL, FREET4, T3FREE, THYROIDAB in the last 72 hours. Anemia Panel: No results for input(s): VITAMINB12, FOLATE, FERRITIN, TIBC, IRON, RETICCTPCT in the last 72 hours. Urine analysis:    Component Value Date/Time   COLORURINE AMBER (A) 10/01/2018 1523   APPEARANCEUR HAZY (A) 10/01/2018 1523   LABSPEC 1.023 10/01/2018 1523   PHURINE 5.0 10/01/2018 1523   GLUCOSEU NEGATIVE 10/01/2018 1523   HGBUR SMALL (A) 10/01/2018 1523   BILIRUBINUR NEGATIVE 10/01/2018 1523   KETONESUR NEGATIVE 10/01/2018 1523   PROTEINUR NEGATIVE 10/01/2018 1523   NITRITE NEGATIVE 10/01/2018 1523   LEUKOCYTESUR NEGATIVE 10/01/2018 1523   Sepsis Labs: @LABRCNTIP (procalcitonin:4,lacticidven:4) )No results found for this or any previous visit (from the past 240 hour(s)).       Radiology Studies: No results found.    Scheduled Meds: . amiodarone  200 mg Oral Daily  . apixaban  5 mg Oral BID  . carvedilol  3.125 mg Oral Daily  . feeding supplement (ENSURE ENLIVE)  237 mL Oral TID BM  . feeding supplement (PRO-STAT SUGAR FREE 64)  30 mL Oral BID  . ferrous sulfate  325 mg Oral TID WC  . folic acid  1 mg Oral Daily  . furosemide  40 mg Oral Daily  . magnesium oxide  400 mg Oral BID  . pantoprazole  40 mg Oral BID AC  . polyethylene glycol  17 g Oral BID  . rosuvastatin  20 mg Oral q1800   Continuous Infusions: . sodium chloride 250 mL (11/26/18 1716)  . ampicillin (OMNIPEN) IV 2 g (11/28/18 0424)     LOS: 3 days      Calvert Cantor, MD [facility] Pager: [email or web address] Password [email or web address] 11/28/2018, 8:50 AM

## 2018-11-28 NOTE — Plan of Care (Signed)
  Problem: RH BOWEL ELIMINATION Goal: RH STG MANAGE BOWEL WITH ASSISTANCE Description STG Manage Bowel with Assistance. Mod  Outcome: Progressing   Problem: RH BLADDER ELIMINATION Goal: RH STG MANAGE BLADDER WITH ASSISTANCE Description STG Manage Bladder With Assistance. Min  Outcome: Progressing   Problem: RH SKIN INTEGRITY Goal: RH STG SKIN FREE OF INFECTION/BREAKDOWN Description Free of skin breakdown, infection with mod assist  Outcome: Progressing   Problem: RH PAIN MANAGEMENT Goal: RH STG PAIN MANAGED AT OR BELOW PT'S PAIN GOAL Description No pain or less than 3  Outcome: Progressing   

## 2018-11-28 NOTE — Progress Notes (Signed)
Occupational Therapy Session Note  Patient Details  Name: Ashkon Romanowski MRN: [id number] Date of Birth: 1966/07/04  Today's Date: 11/28/2018 OT Individual Time: 1300-1358 OT Individual Time Calculation (min): 58 min   Short Term Goals: Week 1:  OT Short Term Goal 1 (Week 1): Pt will complete BSC transfer with 2 assist  OT Short Term Goal 2 (Week 1): Pt will complete LB dressing sit<stand with 2 assist using LRAD to work on standing tolerance  OT Short Term Goal 3 (Week 1): Pt will complete 1/3 components of donning pants using AE as needed   Skilled Therapeutic Interventions/Progress Updates:    Pt greeted in bed with no c/o pain. Declining participation in bathing tasks and amenable to get OOB. BP monitored during session with readings recorded below. 2 assist for placement of maxi pad and lift transfer to TIS. Handwashing and oral care completed w/c level with setup, TIS in neutral. Pt was then escorted via w/c to dayroom. He completed B UE there ex using 1# bar x15 reps, 2 sets. For last 5 minutes of exercises, pts 20 degree w/c tilt was changed to 5 degrees. He remained asymptomatic of orthostatics. Pt was then escorted back to room, and transferred to bed using maximove with 2 assist. He was left with all needs within reach at end of tx. Tx focus was placed on active tolerance and UB strengthening while maintaining stable vitals.   BP while supine: 97/68 (02 sats 96% on RA) BP post 1st maximove transfer: 100/73 (02 sats 97-98%) BP during seated activity, TIS in 20 degree tilt: 98/67 (02 sats 96-99%) BP during seated activity, TIS in 20 degree tilt cont: 95/75 (o2 sats 99%) BP after 5 minutes of seated exercises, chair tilted to 5 degrees upright: 90/78 (02 sats 98%)  Therapy Documentation Precautions:  Precautions Precautions: Fall Precaution Comments: monitor BP, hypotensive in sitting/standing Required Braces or Orthoses: Other Brace Other Brace: RLE Bledsoe brace locked in extension  when ambulating that can be d/c'd when pt can perform 10 SLRs without assist Restrictions Weight Bearing Restrictions: No RLE Weight Bearing: Weight bearing as tolerated(Bledsoe Brace OOB) LLE Weight Bearing: Weight bearing as tolerated Vital Signs: Therapy Vitals Temp: 98.3 F (36.8 C) Temp Source: Oral Pulse Rate: 93 Resp: 20 BP: 101/70 Patient Position (if appropriate): Lying Oxygen Therapy SpO2: 95 % O2 Device: Room Air Pain: No c/o pain during tx    ADL:  :     Therapy/Group: Individual Therapy  Tasnia Spegal A Loana Salvaggio 11/28/2018, 4:55 PM

## 2018-11-28 NOTE — Progress Notes (Signed)
Occupational Therapy Session Note  Patient Details  Name: Aaron [name] MRN: [id number] Date of Birth: 1966/01/29  Today's Date: 11/28/2018 OT Individual Time: 1000-1030 OT Individual Time Calculation (min): 30 min  and Today's Date: 11/28/2018 OT Missed Time: 30 Minutes Missed Time Reason: Patient fatigue   Short Term Goals: Week 1:  OT Short Term Goal 1 (Week 1): Pt will complete BSC transfer with 2 assist  OT Short Term Goal 2 (Week 1): Pt will complete LB dressing sit<stand with 2 assist using LRAD to work on standing tolerance  OT Short Term Goal 3 (Week 1): Pt will complete 1/3 components of donning pants using AE as needed   Skilled Therapeutic Interventions/Progress Updates:    Upon entering the room, pt seated in wheelchair with c/o buttocks pain and very fatigued. BP checked while tilted in wheelchair to be 145/119 with HR of 108. OT assisted pt back to bed with second helper and use of maxi move lift for safety. Pt rolling L < R with min A and use of bed rail to remove lift pad. Once supine in bed, BP of 107/73 and HR 126. Pt verbalized feeling very fatigued and declined OT intervention at this time in hopes of resting to participate in afternoon session. Call bell and all needed items within reach upon exiting the room.   Therapy Documentation Precautions:  Precautions Precautions: Fall Precaution Comments: monitor BP, hypotensive in sitting/standing Required Braces or Orthoses: Other Brace Other Brace: RLE Bledsoe brace locked in extension when ambulating that can be d/c'd when pt can perform 10 SLRs without assist Restrictions Weight Bearing Restrictions: No RLE Weight Bearing: Weight bearing as tolerated(Bledsoe Brace OOB) LLE Weight Bearing: Weight bearing as tolerated General: General OT Amount of Missed Time: 30 Minutes Vital Signs: Therapy Vitals Pulse Rate: 93 BP: 97/73 Patient Position (if appropriate): Sitting Oxygen Therapy SpO2: (!) 87 %(with max exertion  ) O2 Device: Room Air Patient Activity (if Appropriate): Other (Comment)(WC propulsion ) Pain: Pain Assessment Pain Scale: 0-10 Pain Score: 0-No pain   Therapy/Group: Individual Therapy  Alen Bleacher 11/28/2018, 10:35 AM

## 2018-11-28 NOTE — Progress Notes (Signed)
Orthopedic Tech Progress Note Patient Details:  Haidan Rin [birth year] [id number]  CPM Left Knee CPM Left Knee: On Left Knee Flexion (Degrees): 90 Left Knee Extension (Degrees): 0 CPM Right Knee CPM Right Knee: On Right Knee Flexion (Degrees): 90 Right Knee Extension (Degrees): 0  Post Interventions Patient Tolerated: Well Instructions Provided: Care of device, Adjustment of device  [name] 11/28/2018, 6:43 PM

## 2018-11-29 ENCOUNTER — Inpatient Hospital Stay (HOSPITAL_COMMUNITY): Payer: Self-pay | Admitting: Occupational Therapy

## 2018-11-29 DIAGNOSIS — I951 Orthostatic hypotension: Secondary | ICD-10-CM

## 2018-11-29 NOTE — Progress Notes (Signed)
PROGRESS NOTE    Aaron [name]   LYY:[id number]  DOB: [birth year]  DOA: 11/25/2018 PCP: [facility] [place]   Brief Narrative:  Aaron [name] is an 53 y.o. male with morbid obesity; CAD (not a candidate for CABG); afib; and acute systolic CHF.  He was admitted from 3/5-4/9 for acute systolic CHF with EF 10-15% and anasarca; he developed an upper GI bleed while hospitalized requiring 1 unit PRBC.  He was discharged to CIR but developed SOB again on 4/16 and was transferred back to inpatient. He was found to have a  R-sided empyema that grew E. Coli, for which he was recommended to be treated with Ampicillin through 5/7.   He was discharged back to CIR on 4/29. [facility] is following for medical management.   Subjective:  No complaints today. He does state that his BP dropped to 80s when working with PT.     Assessment & Plan:   Active Problems:   Debility - management per rehab staff  Right e coli empyema - cont Ampicillin until 5/7- I have asked for PT to obtain a pulse ox when he exerts himself today  Chronic systolic CHF with EF of 15 % - cont current medications and follow fluid status closely - weight is noted to be dropping and BP has dropped today when standing- will need to hold Lasix  A-fib - on Coreg and Amiodarone for rate control - cont Eliquis  CAD - medical management - not a candidate for CABG   Pedal edema - currently in UNNA boots which are to be changed on Thursdays  Time spent in minutes: 25 min DVT prophylaxis: Eliquis  Antimicrobials:  Anti-infectives (From admission, onward)   Start     Dose/Rate Route Frequency Ordered Stop   11/25/18 1800  ampicillin (OMNIPEN) 2 g in sodium chloride 0.9 % 100 mL IVPB     2 g 300 mL/hr over 20 Minutes Intravenous Every 6 hours 11/25/18 1552         Objective: Vitals:   11/28/18 1434 11/28/18 2037 11/29/18 0513 11/29/18 1050  BP: 101/70 105/70 114/78 105/83  Pulse: 93 (!) 110  (!) 115 (!) 109  Resp: 20 16 20    Temp: 98.3 F (36.8 C) 98.8 F (37.1 C) 98.8 F (37.1 C)   TempSrc: Oral Oral Oral   SpO2: 95% 95% 92%   Weight:   117.9 kg   Height:        Intake/Output Summary (Last 24 hours) at 11/29/2018 1054 Last data filed at 11/29/2018 0700 Gross per 24 hour  Intake 2372.33 ml  Output -  Net 2372.33 ml   Filed Weights   11/27/18 0625 11/28/18 0421 11/29/18 0513  Weight: 120.6 kg 119.4 kg 117.9 kg    Examination: General exam: Appears comfortable  HEENT: PERRLA, oral mucosa moist, no sclera icterus or thrush Respiratory system: crackles in left lung bases- Respiratory effort normal. Cardiovascular system: S1 & S2 heard,  No murmurs  Gastrointestinal system: Abdomen soft, non-tender, nondistended. Normal bowel sounds   Central nervous system: Alert and oriented. No focal neurological deficits. Extremities: No cyanosis, clubbing or edema- UNNA boots present Skin: No rashes or ulcers Psychiatry:  Mood & affect appropriate.     Data Reviewed: I have personally reviewed following labs and imaging studies  CBC: Recent Labs  Lab 11/24/18 1300 11/26/18 0512  WBC 8.8 8.2  NEUTROABS 6.8 6.2  HGB 7.6* 7.2*  HCT 24.1* 23.9*  MCV 94.9 99.6  PLT 225 214   Basic Metabolic Panel: Recent Labs  Lab 11/24/18 0459 11/24/18 1300 11/26/18 0512  NA  --  138 138  K  --  3.6 3.6  CL  --  101 103  CO2  --  26 26  GLUCOSE  --  92 93  BUN  --  17 22*  CREATININE  --  0.95 1.00  CALCIUM  --  8.5* 8.1*  MG 1.8  --  2.3   GFR: Estimated Creatinine Clearance: 112.6 mL/min (by C-G formula based on SCr of 1 mg/dL). Liver Function Tests: Recent Labs  Lab 11/24/18 1300 11/26/18 0512  AST 20 20  ALT 25 22  ALKPHOS 89 82  BILITOT 0.9 0.8  PROT 6.0* 7.5  ALBUMIN 2.0* 1.9*   No results for input(s): LIPASE, AMYLASE in the last 168 hours. No results for input(s): AMMONIA in the last 168 hours. Coagulation Profile: No results for input(s): INR, PROTIME  in the last 168 hours. Cardiac Enzymes: No results for input(s): CKTOTAL, CKMB, CKMBINDEX, TROPONINI in the last 168 hours. BNP (last 3 results) No results for input(s): PROBNP in the last 8760 hours. HbA1C: No results for input(s): HGBA1C in the last 72 hours. CBG: No results for input(s): GLUCAP in the last 168 hours. Lipid Profile: No results for input(s): CHOL, HDL, LDLCALC, TRIG, CHOLHDL, LDLDIRECT in the last 72 hours. Thyroid Function Tests: No results for input(s): TSH, T4TOTAL, FREET4, T3FREE, THYROIDAB in the last 72 hours. Anemia Panel: No results for input(s): VITAMINB12, FOLATE, FERRITIN, TIBC, IRON, RETICCTPCT in the last 72 hours. Urine analysis:    Component Value Date/Time   COLORURINE AMBER (A) 10/01/2018 1523   APPEARANCEUR HAZY (A) 10/01/2018 1523   LABSPEC 1.023 10/01/2018 1523   PHURINE 5.0 10/01/2018 1523   GLUCOSEU NEGATIVE 10/01/2018 1523   HGBUR SMALL (A) 10/01/2018 1523   BILIRUBINUR NEGATIVE 10/01/2018 1523   KETONESUR NEGATIVE 10/01/2018 1523   PROTEINUR NEGATIVE 10/01/2018 1523   NITRITE NEGATIVE 10/01/2018 1523   LEUKOCYTESUR NEGATIVE 10/01/2018 1523   Sepsis Labs: @LABRCNTIP (procalcitonin:4,lacticidven:4) )No results found for this or any previous visit (from the past 240 hour(s)).       Radiology Studies: No results found.    Scheduled Meds: . amiodarone  200 mg Oral Daily  . apixaban  5 mg Oral BID  . carvedilol  3.125 mg Oral Daily  . feeding supplement (ENSURE ENLIVE)  237 mL Oral TID BM  . feeding supplement (PRO-STAT SUGAR FREE 64)  30 mL Oral BID  . ferrous sulfate  325 mg Oral TID WC  . folic acid  1 mg Oral Daily  . furosemide  40 mg Oral Daily  . magnesium oxide  400 mg Oral BID  . pantoprazole  40 mg Oral BID AC  . polyethylene glycol  17 g Oral BID  . rosuvastatin  20 mg Oral q1800   Continuous Infusions: . sodium chloride 1,000 mL (11/28/18 0945)  . ampicillin (OMNIPEN) IV 2 g (11/29/18 1043)     LOS: 4 days       Calvert Cantor, MD [facility] Pager: [email or web address] Password [facility] 11/29/2018, 10:54 AM

## 2018-11-29 NOTE — Progress Notes (Signed)
Occupational Therapy Session Note  Patient Details  Name: Aaron [name] MRN: [id number] Date of Birth: 07-19-1966  Today's Date: 11/29/2018 OT Individual Time: 0905-1003 OT Individual Time Calculation (min): 58 min   Short Term Goals: Week 1:  OT Short Term Goal 1 (Week 1): Pt will complete BSC transfer with 2 assist  OT Short Term Goal 2 (Week 1): Pt will complete LB dressing sit<stand with 2 assist using LRAD to work on standing tolerance  OT Short Term Goal 3 (Week 1): Pt will complete 1/3 components of donning pants using AE as needed   Skilled Therapeutic Interventions/Progress Updates:    Pt greeted in bed with no c/o pain. Already completed bathing this AM with staff, and wanted to get dressed. BP monitored during session with readings recorded below. During LB dressing, he was able to assist a little with pulling pants up from knee to thighs. 2 assist for fully elevating pants with pt rolling Rt>Lt with Mod A. Maximove transfer<TIS completed with 2 assist. Overhead shirt donned with Min A. He was then agreeable to complete several grooming tasks at the sink, including oral care, handwashing, and shaving. TIS placed in neutral position to work on upright sitting tolerance. Noted increased exertion with breathing, but pt stated he felt ok in this position, not lightheaded but "just different." He needed assist to open the shaving cream container. Afterwards, guided him through 5 w/c push ups to work on UB strengthening needed in prep for functional transfers. Due to BP drop and feeling dizzy/lightheaded, TIS was reclined to 20 degrees, and BP reassessed with higher read. Pt then opted to return to bed, used maximove +2 once again. He was repositioned for comfort in bed with all needs within reach. Tx focus placed on activity tolerance, UB strengthening, and upright tolerance while maintaining stable vitals.   BP while in bed: 109/75, 02 sats 97-98% on RA BP post maximove transfer to TIS, TIS in  20 degrees of tilt: 95/60, 02 sats 91-94% BP after 6 minutes of sink-side activity, TIS in neutral: 90/72 02 sats 89-91%. Pt rated perceived exertion level as 7/10 during shaving.  BP after 5 w/c push ups, TIS still in neutral: 86/66 (pt symptomatic), 02 sats 99%.  BP after a few minutes with TIS tilted to 20 degrees: 99/85, 02 sats 94-97% (symptoms resolved) BP after maximove transfer back to bed: 97/76, 02 sats 91-92%.   Therapy Documentation Precautions:  Precautions Precautions: Fall Precaution Comments: monitor BP, hypotensive in sitting/standing Required Braces or Orthoses: Other Brace Other Brace: RLE Bledsoe brace locked in extension when ambulating that can be d/c'd when pt can perform 10 SLRs without assist Restrictions Weight Bearing Restrictions: No RLE Weight Bearing: Weight bearing as tolerated LLE Weight Bearing: Weight bearing as tolerated Vital Signs: Therapy Vitals Pulse Rate: (!) 109 BP: 105/83   ADL:       Therapy/Group: Individual Therapy  Glorya Bartley A Stephanie Mcglone 11/29/2018, 10:51 AM

## 2018-11-29 NOTE — Progress Notes (Signed)
Orthopedic Tech Progress Note Patient Details:  Aaron [name] 1965-08-30 [id number]  CPM Left Knee CPM Left Knee: On Left Knee Flexion (Degrees): 90 Left Knee Extension (Degrees): 0 CPM Right Knee CPM Right Knee: On Right Knee Flexion (Degrees): 90 Right Knee Extension (Degrees): 0 Additional Comments: (tolerated without complaint of)  Post Interventions Patient Tolerated: Well Instructions Provided: Care of device, Adjustment of device  [name] 11/29/2018, 1:06 PM

## 2018-11-29 NOTE — Progress Notes (Signed)
Orthopedic Tech Progress Note Patient Details:  Aaron [name] [birth year] [id number]  CPM Left Knee CPM Left Knee: On Left Knee Flexion (Degrees): 90 Left Knee Extension (Degrees): 0 CPM Right Knee CPM Right Knee: Off Right Knee Flexion (Degrees): 90 Right Knee Extension (Degrees): 0 Additional Comments: (tolerated without complaint of)  Post Interventions Patient Tolerated: Well Instructions Provided: Care of device, Adjustment of device  [name] 11/29/2018, 1:04 PM

## 2018-11-29 NOTE — Progress Notes (Signed)
[name] PHYSICAL MEDICINE & REHABILITATION PROGRESS NOTE   Subjective/Complaints: Patient seen laying in bed this morning.  He states he slept well overnight.  He has questions regarding length of antibiotics.  He was seen by hospitalist this a.m., notes reviewed.  ROS- Denies CP, SOB, N/V/D  Objective:   No results found. No results for input(s): WBC, HGB, HCT, PLT in the last 72 hours. No results for input(s): NA, K, CL, CO2, GLUCOSE, BUN, CREATININE, CALCIUM in the last 72 hours.  Intake/Output Summary (Last 24 hours) at 11/29/2018 1150 Last data filed at 11/29/2018 0700 Gross per 24 hour  Intake 2372.33 ml  Output -  Net 2372.33 ml     Physical Exam: Vital Signs Blood pressure 105/83, pulse (!) 109, temperature 98.8 F (37.1 C), temperature source Oral, resp. rate 20, height 6' 0.4" (1.839 m), weight 117.9 kg, SpO2 92 %. Constitutional: No distress . Vital signs reviewed. HENT: Normocephalic.  Atraumatic. Eyes: EOMI.  No discharge. Cardiovascular: No JVD. Respiratory: Normal effort. GI: Non-distended. Musc: Edema in bilateral feet Neurologic: Alert Motor: Bilateral upper extremities: 4/5 proximal distal Bilateral lower extremities:  Right lower extremity: Hip flexion 2/5, knee extension 2+/5, ankle dorsiflexion 3/5, stable Left lower extremity: Hip flexion 3-/5, knee extension 3/5, ankle dorsiflexion 4/5, stable Skin: Lower extremities and Unna boots  Assessment/Plan: 1. Functional deficits secondary to debility secondary to CHF and empyema which require 3+ hours per day of interdisciplinary therapy in a comprehensive inpatient rehab setting.  Physiatrist is providing close team supervision and 24 hour management of active medical problems listed below.  Physiatrist and rehab team continue to assess barriers to discharge/monitor patient progress toward functional and medical goals  Care Tool:  Bathing  Bathing activity did not occur: Refused Body parts bathed by  patient: Right arm, Left arm, Chest, Abdomen, Face   Body parts bathed by helper: Right lower leg, Left lower leg Body parts n/a: Front perineal area, Buttocks   Bathing assist Assist Level: Minimal Assistance - Patient > 75%     Upper Body Dressing/Undressing Upper body dressing   What is the patient wearing?: Pull over shirt    Upper body assist Assist Level: Minimal Assistance - Patient > 75%    Lower Body Dressing/Undressing Lower body dressing      What is the patient wearing?: Pants     Lower body assist Assist for lower body dressing: 2 Helpers     Toileting Toileting    Toileting assist Assist for toileting: Total Assistance - Patient < 25%     Transfers Chair/bed transfer  Transfers assist  Chair/bed transfer activity did not occur: Safety/medical concerns  Chair/bed transfer assist level: Total Assistance - Patient < 25%     Locomotion Ambulation   Ambulation assist   Ambulation activity did not occur: Safety/medical concerns          Walk 10 feet activity   Assist  Walk 10 feet activity did not occur: Safety/medical concerns        Walk 50 feet activity   Assist Walk 50 feet with 2 turns activity did not occur: Safety/medical concerns         Walk 150 feet activity   Assist Walk 150 feet activity did not occur: Safety/medical concerns         Walk 10 feet on uneven surface  activity   Assist Walk 10 feet on uneven surfaces activity did not occur: Safety/medical concerns         Wheelchair  Assist Will patient use wheelchair at discharge?: No   Wheelchair activity did not occur: Safety/medical concerns         Wheelchair 50 feet with 2 turns activity    Assist    Wheelchair 50 feet with 2 turns activity did not occur: Safety/medical concerns       Wheelchair 150 feet activity     Assist Wheelchair 150 feet activity did not occur: Safety/medical concerns        Medical Problem List  and Plan: 1.  Deficits with mobility, transfers, endurance, weakness secondary to multifactorial debility.  Continue CIR 2.  Antithrombotics: -DVT/anticoagulation:  Pharmaceutical: Other (comment)--Eliquis. Continue to monitor hemoglobin as well as signs of bleeding.             -antiplatelet therapy: N/A 3. Pain Management: Tylenol prn 4. Mood: LCSW to follow for evaluate              -antipsychotic agents: N/A 5. Neuropsych: This patient is capable of making decisions on his own behalf. 6. Skin/Wound Care: Routine pressure relief measures.  7. Fluids/Electrolytes/Nutrition: Monitor I/O. Continue ensure bid.   Increasing BUN/creatinine ratio, encourage fluids  Labs ordered for tomorrow 8. Empyema due to E coli: Continue ampicillin --Antibiotic day #14/28.  CT chest prior to d/c antibiotics? 9. ABLA due to GIB/spontaneous thigh hematoma: Continue protonix bid. Received 1 unit PRBC 4/24 and feraheme 4/26.   Now on iron supplement.  Added folic acid due to low folate levels.    Hemoglobin 7.2 on 4/30, labs ordered for tomorrow 10. ICM/Acute systolic CHF: Monitor daily weights.  Monitor for signs/symptoms of fluid overload.  BP remains soft--no ARB. Continue coreg and Crestor.  Filed Weights   11/27/18 0625 11/28/18 0421 11/29/18 0513  Weight: 120.6 kg 119.4 kg 117.9 kg   Improving on 5/3 11. 3 vessel CAD: Not a surgical candidate-treated medically 12. Paroxymal A Fib with RVR: Monitor HR bid-continue amiodarone, coreg and Eliquis. Cards recommends re-eval prior to discharge.  13. Bradycardia: Heart rate improving with decrease in Coreg and d/c of digoxin.  Monitor with increased physical activity. 14. Protein calorie malnutrition: Continue to offer supplements tid. Magnesium supplemented yesterday due to low normal levels.  Added Mg supplement..  15. Orthostatic hypotension: Keep HOB > 30 degrees. Continue Unna boots for edema control and BP support.  Abdominal binder if needed  Lasix being  held per hospitalist due to drop in BP with therapies and tachycardia  LOS: 4 days A FACE TO FACE EVALUATION WAS PERFORMED  Ankit Karis Juba 11/29/2018, 11:50 AM

## 2018-11-29 NOTE — Progress Notes (Signed)
Orthopedic Tech Progress Note Patient Details:  Aaron [name] 01-Nov-1965 [id number]  CPM Left Knee CPM Left Knee: On Left Knee Flexion (Degrees): 90 Left Knee Extension (Degrees): 0 CPM Right Knee CPM Right Knee: On Right Knee Flexion (Degrees): 90 Right Knee Extension (Degrees): 0 Additional Comments: (tolerated without complaint of)  Post Interventions Patient Tolerated: Well Instructions Provided: Care of device, Adjustment of device  [name] 11/29/2018, 1:05 PM

## 2018-11-30 ENCOUNTER — Inpatient Hospital Stay (HOSPITAL_COMMUNITY): Payer: Medicaid Other

## 2018-11-30 ENCOUNTER — Inpatient Hospital Stay (HOSPITAL_COMMUNITY): Payer: Self-pay | Admitting: Occupational Therapy

## 2018-11-30 ENCOUNTER — Inpatient Hospital Stay (HOSPITAL_COMMUNITY): Payer: Self-pay

## 2018-11-30 DIAGNOSIS — D649 Anemia, unspecified: Secondary | ICD-10-CM

## 2018-11-30 DIAGNOSIS — I5022 Chronic systolic (congestive) heart failure: Secondary | ICD-10-CM

## 2018-11-30 LAB — CBC WITH DIFFERENTIAL/PLATELET
Abs Immature Granulocytes: 0 10*3/uL (ref 0.00–0.07)
Basophils Absolute: 0 10*3/uL (ref 0.0–0.1)
Basophils Relative: 0 %
Eosinophils Absolute: 0.2 10*3/uL (ref 0.0–0.5)
Eosinophils Relative: 3 %
HCT: 25.5 % — ABNORMAL LOW (ref 39.0–52.0)
Hemoglobin: 7.6 g/dL — ABNORMAL LOW (ref 13.0–17.0)
Lymphocytes Relative: 3 %
Lymphs Abs: 0.2 10*3/uL — ABNORMAL LOW (ref 0.7–4.0)
MCH: 29.1 pg (ref 26.0–34.0)
MCHC: 29.8 g/dL — ABNORMAL LOW (ref 30.0–36.0)
MCV: 97.7 fL (ref 80.0–100.0)
Monocytes Absolute: 0.8 10*3/uL (ref 0.1–1.0)
Monocytes Relative: 10 %
Neutro Abs: 6.8 10*3/uL (ref 1.7–7.7)
Neutrophils Relative %: 84 %
Platelets: 228 10*3/uL (ref 150–400)
RBC: 2.61 MIL/uL — ABNORMAL LOW (ref 4.22–5.81)
RDW: 22.2 % — ABNORMAL HIGH (ref 11.5–15.5)
WBC: 8.1 10*3/uL (ref 4.0–10.5)
nRBC: 0 % (ref 0.0–0.2)
nRBC: 0 /100 WBC

## 2018-11-30 LAB — BASIC METABOLIC PANEL
Anion gap: 9 (ref 5–15)
BUN: 28 mg/dL — ABNORMAL HIGH (ref 6–20)
CO2: 24 mmol/L (ref 22–32)
Calcium: 8.2 mg/dL — ABNORMAL LOW (ref 8.9–10.3)
Chloride: 107 mmol/L (ref 98–111)
Creatinine, Ser: 0.99 mg/dL (ref 0.61–1.24)
GFR calc Af Amer: >60 mL/min (ref >60)
GFR calc non Af Amer: >60 mL/min (ref >60)
Glucose, Bld: 105 mg/dL — ABNORMAL HIGH (ref 70–99)
Potassium: 3.4 mmol/L — ABNORMAL LOW (ref 3.5–5.1)
Sodium: 140 mmol/L (ref 135–145)

## 2018-11-30 LAB — CBC
HCT: 23.5 % — ABNORMAL LOW (ref 39.0–52.0)
Hemoglobin: 6.9 g/dL — CL (ref 13.0–17.0)
MCH: 29.5 pg (ref 26.0–34.0)
MCHC: 29.4 g/dL — ABNORMAL LOW (ref 30.0–36.0)
MCV: 100.4 fL — ABNORMAL HIGH (ref 80.0–100.0)
Platelets: 224 10*3/uL (ref 150–400)
RBC: 2.34 MIL/uL — ABNORMAL LOW (ref 4.22–5.81)
RDW: 21.7 % — ABNORMAL HIGH (ref 11.5–15.5)
WBC: 7.7 10*3/uL (ref 4.0–10.5)
nRBC: 0 % (ref 0.0–0.2)

## 2018-11-30 LAB — PREPARE RBC (CROSSMATCH)

## 2018-11-30 MED ORDER — SODIUM CHLORIDE 0.9 % IV SOLN
510.0000 mg | Freq: Once | INTRAVENOUS | Status: AC
  Administered 2018-11-30: 16:00:00 510 mg via INTRAVENOUS
  Filled 2018-11-30: qty 17

## 2018-11-30 MED ORDER — SODIUM CHLORIDE 0.9% IV SOLUTION
Freq: Once | INTRAVENOUS | Status: AC
  Administered 2018-11-30: 13:00:00 via INTRAVENOUS

## 2018-11-30 MED ORDER — POTASSIUM CHLORIDE CRYS ER 20 MEQ PO TBCR: 20.0000 meq | EXTENDED_RELEASE_TABLET | Freq: Every day | ORAL | Status: DC

## 2018-11-30 MED ORDER — CARVEDILOL 3.125 MG PO TABS
3.1250 mg | ORAL_TABLET | Freq: Two times a day (BID) | ORAL | Status: DC
  Administered 2018-12-01 – 2018-12-18 (×35): 3.125 mg via ORAL
  Filled 2018-11-30 (×37): qty 1

## 2018-11-30 MED ORDER — POTASSIUM CHLORIDE CRYS ER 20 MEQ PO TBCR
40.0000 meq | EXTENDED_RELEASE_TABLET | Freq: Once | ORAL | Status: AC
  Administered 2018-11-30: 17:00:00 40 meq via ORAL
  Filled 2018-11-30: qty 2

## 2018-11-30 MED ORDER — POTASSIUM CHLORIDE CRYS ER 20 MEQ PO TBCR
20.0000 meq | EXTENDED_RELEASE_TABLET | Freq: Every day | ORAL | Status: DC
  Administered 2018-11-30: 16:00:00 20 meq via ORAL
  Filled 2018-11-30 (×2): qty 1

## 2018-11-30 NOTE — Progress Notes (Signed)
Occupational Therapy Note  Patient Details  Name: Obsidian Nguyenthi MRN: [id number] Date of Birth: June 16, 1966  Today's Date: 11/30/2018 OT Missed Time: 60 Minutes Missed Time Reason: Other (comment)(blood transfusion)  Pt currently receiving blood transfusion as Hgb of 6.9 and pt reports not feeling well. OT will attempt at next available time when appropriate.   Jackquline Denmark P 11/30/2018, 2:04 PM

## 2018-11-30 NOTE — Progress Notes (Signed)
Occupational Therapy Session Note  Patient Details  Name: Aaron [name] MRN: [id number] Date of Birth: 08/15/1965  Today's Date: 11/30/2018 OT Individual Time: 6195-0932 OT Individual Time Calculation (min): 43 min    Short Term Goals: Week 1:  OT Short Term Goal 1 (Week 1): Pt will complete BSC transfer with 2 assist  OT Short Term Goal 2 (Week 1): Pt will complete LB dressing sit<stand with 2 assist using LRAD to work on standing tolerance  OT Short Term Goal 3 (Week 1): Pt will complete 1/3 components of donning pants using AE as needed   Skilled Therapeutic Interventions/Progress Updates:    Upon entering the room, pt supine in bed and reports fatigue. Physician arrived during this session and discussing need for transfusion today secondary to low lab values. Pt rolling with min A for therapist to pull pants over B hips and place maxi sling under him. Pt needing multiple rest breaks secondary to fatigue with all tasks this session. Pt's BP at 90/62 in supine. Pt transferred into wheelchair with maxi move and tilted slightly in chair for comfort with BP results of 103/65 and pt asymptomatic. OT assisted pt to sink for grooming tasks with set up A from seated level. Chair alarm belt donned for safety with call bell and all needed items within reach upon exiting the room.   Therapy Documentation Precautions:  Precautions Precautions: Fall Precaution Comments: monitor BP, hypotensive in sitting/standing Required Braces or Orthoses: Other Brace Other Brace: RLE Bledsoe brace locked in extension when ambulating that can be d/c'd when pt can perform 10 SLRs without assist Restrictions Weight Bearing Restrictions: No RLE Weight Bearing: Weight bearing as tolerated LLE Weight Bearing: Weight bearing as tolerated   Pain: Pain Assessment Pain Scale: 0-10 Pain Score: 0-No pain   Therapy/Group: Individual Therapy  Alen Bleacher 11/30/2018, 11:48 AM

## 2018-11-30 NOTE — Significant Event (Signed)
CRITICAL VALUE ALERT  Critical Value:  Hgb 6.9  Date & Time Notied:  11/30/18 27782  Provider Notified: Harvel Ricks, PA  Orders Received/Actions taken: Will assess on rounds this AM.

## 2018-11-30 NOTE — Progress Notes (Signed)
Physical Therapy Session Note  Patient Details  Name: Aaron [name] MRN: [id number] Date of Birth: 1965-12-19  Today's Date: 11/30/2018 PT Individual Time:Session1: 1561-5379; [name]: 4327-6147 PT Individual Time Calculation (min): 60 min & 45 min  Short Term Goals: Week 1:  PT Short Term Goal 1 (Week 1): Pt will perform bed mobliity with mod assist  PT Short Term Goal 2 (Week 1): Pt will propell WC with min assist x 179ft.  PT Short Term Goal 3 (Week 1): Pt will perform bed<>WC transfer with max assist +2   Skilled Therapeutic Interventions/Progress Updates:    Session1:  Patient in TIS w/c in room.  Reports for blood transfusion and already has been typed.  Agreeable to participate but wants to let RN know in case blood comes up.  Assisted in w/c to dayroom.  Set up and pt performed 1 minute bouts on Kinetron @ 60 cm/sec x 3 rounds with at least 2 min rest in between.  Vitals after first bout 92/58 HR 98 SpO2 96%.  Patient seated with feet on floor reaching to obtain and place pieces for Connect 4 game x 3 games.  Patient assisted to room in w/c.  SBT w/c to bed to L with +2 A cues for pt to lean over PT's back to unweight hips and improve ease of transfer.  Patient sit to supine with min A for R LE.  Scooted to [facility] pulling up on head board.  Performed LE therex in supine including SAQ x 10, bridges x 2 x 10, heel slides x 10.  Left in supine with call bell in reach and 4 rails up on Vital Go tilt bed.    Session2:  Patient in supine and reports finished blood transfusion.  Finishing Iron infusion now.  Agreeable to utilize bed for standing.  Tilted incrementally to 50 degrees max starting @ 20 degrees.  Patient in semi standing performed 2 x 5 reps hip and knee extension w/ 5 sec hold.  BP/HR measures as follows:  Supine: 101/74 HR 114; 20 deg: 96/77 HR 107; 30 deg: 106/77 HR 110; 40 deg: 104/74 HR 110; 50 deg: 97/72 HR 131.  Stayed up @ 50 deg 8 minutes.  Patient SOB and reporting some back pain  and trunk soreness.  Returned to supine.  Rolled in supine to change brief and attempt to use bedpan due to feeling as if having BM.  Rolled with min to mod A to achieve enough turn for hygiene.  Patient left in supine with call bell in reach and 4 rails up on Vital go Tilt bed.    Therapy Documentation Precautions:  Precautions Precautions: Fall Precaution Comments: monitor BP, hypotensive in sitting/standing Required Braces or Orthoses: Other Brace Other Brace: RLE Bledsoe brace locked in extension when ambulating that can be d/c'd when pt can perform 10 SLRs without assist Restrictions Weight Bearing Restrictions: No RLE Weight Bearing: Weight bearing as tolerated LLE Weight Bearing: Weight bearing as tolerated Pain: Pain Assessment Pain Score: 0-No pain    Therapy/Group: Individual Therapy  [name]  [name], PT 11/30/2018, 5:06 PM

## 2018-11-30 NOTE — Progress Notes (Signed)
EKG by nurse showed A flutter with HR 130's and prolonged Qtc in 500's --she relayed that patient was feeling better.  Discussed patient's symptoms with cardiology who felt QTc likely elevated due to amiodarone. Bradycardia has resolved with patient's heart rate now trending in 90's to 130's with attempts at raising to 50 degrees.  She recommended increasing coreg back to bid and monitoring for any recurrent SE.

## 2018-11-30 NOTE — Progress Notes (Signed)
Arrived to room for DBIV: Nurse, Shelby Dubin clarified this was to be a 2hour post transfusion CBC. Order modified for new time.

## 2018-11-30 NOTE — Progress Notes (Signed)
PROGRESS NOTE    Aaron [name]   MPN:[id number]  DOB: 03/18/1966  DOA: 11/25/2018 PCP: [facility] [place]   Brief Narrative:  Aaron [name] is an 53 y.o. male with morbid obesity; CAD (not a candidate for CABG); afib; and acute systolic CHF.  He was admitted from 3/5-4/9 for acute systolic CHF with EF 10-15% and anasarca; he developed an upper GI bleed while hospitalized requiring 1 unit PRBC.  He was discharged to CIR but developed SOB again on 4/16 and was transferred back to inpatient. He was found to have a  R-sided empyema that grew E. Coli, for which he was recommended to be treated with Ampicillin through 5/7.   He was discharged back to CIR on 4/29. [facility] is following for medical management.   Subjective: No complaints.   Assessment & Plan:   Active Problems:   Debility - management per rehab staff  Right e coli empyema - cont Ampicillin until 5/7-    Anemia of chronic disease and Iron deficiency - will need to transfuse 1 U PRBC today - subsequent to transfusion, will give a dose of feraheme  Chronic systolic CHF with EF of 15 % - cont current medications and follow fluid status closely - weight is noted to be dropping and BP has dropped today when standing- will need to hold Lasix  A-fib - on Coreg and Amiodarone for rate control - cont Eliquis  CAD - medical management - not a candidate for CABG   Pedal edema - currently in UNNA boots which are to be changed on Thursdays  Time spent in minutes: 25 min DVT prophylaxis: Eliquis  Antimicrobials:  Anti-infectives (From admission, onward)   Start     Dose/Rate Route Frequency Ordered Stop   11/25/18 1800  ampicillin (OMNIPEN) 2 g in sodium chloride 0.9 % 100 mL IVPB     2 g 300 mL/hr over 20 Minutes Intravenous Every 6 hours 11/25/18 1552         Objective: Vitals:   11/30/18 0612 11/30/18 0613 11/30/18 1247 11/30/18 1316  BP:  103/77 97/76 97/71   Pulse:  (!) 109  (!) 111 94  Resp:  (!) 21 19 20   Temp:  98.6 F (37 C) 98.6 F (37 C) 99.1 F (37.3 C)  TempSrc:  Oral Oral Oral  SpO2:  97% 93% 91%  Weight: 121.6 kg     Height:        Intake/Output Summary (Last 24 hours) at 11/30/2018 1337 Last data filed at 11/30/2018 1300 Gross per 24 hour  Intake 1717.43 ml  Output 750 ml  Net 967.43 ml   Filed Weights   11/28/18 0421 11/29/18 0513 11/30/18 0612  Weight: 119.4 kg 117.9 kg 121.6 kg    Examination: General exam: Appears comfortable  HEENT: PERRLA, oral mucosa moist, no sclera icterus or thrush Respiratory system: Clear to auscultation. Respiratory effort normal. Cardiovascular system: S1 & S2 heard,  No murmurs  Gastrointestinal system: Abdomen soft, non-tender, nondistended. Normal bowel sounds   Central nervous system: Alert and oriented. No focal neurological deficits. Extremities: No cyanosis, clubbing or edema Skin: No rashes or ulcers Psychiatry:  Mood & affect appropriate.      Data Reviewed: I have personally reviewed following labs and imaging studies  CBC: Recent Labs  Lab 11/24/18 1300 11/26/18 0512 11/30/18 0451  WBC 8.8 8.2 7.7  NEUTROABS 6.8 6.2  --   HGB 7.6* 7.2* 6.9*  HCT 24.1* 23.9* 23.5*  MCV 94.9  99.6 100.4*  PLT 225 214 224   Basic Metabolic Panel: Recent Labs  Lab 11/24/18 0459 11/24/18 1300 11/26/18 0512 11/30/18 0451  NA  --  138 138 140  K  --  3.6 3.6 3.4*  CL  --  101 103 107  CO2  --  26 26 24   GLUCOSE  --  92 93 105*  BUN  --  17 22* 28*  CREATININE  --  0.95 1.00 0.99  CALCIUM  --  8.5* 8.1* 8.2*  MG 1.8  --  2.3  --    GFR: Estimated Creatinine Clearance: 115.5 mL/min (by C-G formula based on SCr of 0.99 mg/dL). Liver Function Tests: Recent Labs  Lab 11/24/18 1300 11/26/18 0512  AST 20 20  ALT 25 22  ALKPHOS 89 82  BILITOT 0.9 0.8  PROT 6.0* 7.5  ALBUMIN 2.0* 1.9*   No results for input(s): LIPASE, AMYLASE in the last 168 hours. No results for input(s): AMMONIA in the  last 168 hours. Coagulation Profile: No results for input(s): INR, PROTIME in the last 168 hours. Cardiac Enzymes: No results for input(s): CKTOTAL, CKMB, CKMBINDEX, TROPONINI in the last 168 hours. BNP (last 3 results) No results for input(s): PROBNP in the last 8760 hours. HbA1C: No results for input(s): HGBA1C in the last 72 hours. CBG: No results for input(s): GLUCAP in the last 168 hours. Lipid Profile: No results for input(s): CHOL, HDL, LDLCALC, TRIG, CHOLHDL, LDLDIRECT in the last 72 hours. Thyroid Function Tests: No results for input(s): TSH, T4TOTAL, FREET4, T3FREE, THYROIDAB in the last 72 hours. Anemia Panel: No results for input(s): VITAMINB12, FOLATE, FERRITIN, TIBC, IRON, RETICCTPCT in the last 72 hours. Urine analysis:    Component Value Date/Time   COLORURINE AMBER (A) 10/01/2018 1523   APPEARANCEUR HAZY (A) 10/01/2018 1523   LABSPEC 1.023 10/01/2018 1523   PHURINE 5.0 10/01/2018 1523   GLUCOSEU NEGATIVE 10/01/2018 1523   HGBUR SMALL (A) 10/01/2018 1523   BILIRUBINUR NEGATIVE 10/01/2018 1523   KETONESUR NEGATIVE 10/01/2018 1523   PROTEINUR NEGATIVE 10/01/2018 1523   NITRITE NEGATIVE 10/01/2018 1523   LEUKOCYTESUR NEGATIVE 10/01/2018 1523   Sepsis Labs: @LABRCNTIP (procalcitonin:4,lacticidven:4) )No results found for this or any previous visit (from the past 240 hour(s)).       Radiology Studies: No results found.    Scheduled Meds: . amiodarone  200 mg Oral Daily  . apixaban  5 mg Oral BID  . carvedilol  3.125 mg Oral Daily  . feeding supplement (ENSURE ENLIVE)  237 mL Oral TID BM  . feeding supplement (PRO-STAT SUGAR FREE 64)  30 mL Oral BID  . ferrous sulfate  325 mg Oral TID WC  . folic acid  1 mg Oral Daily  . magnesium oxide  400 mg Oral BID  . pantoprazole  40 mg Oral BID AC  . polyethylene glycol  17 g Oral BID  . rosuvastatin  20 mg Oral q1800   Continuous Infusions: . sodium chloride Stopped (11/29/18 7322)  . ampicillin (OMNIPEN)  IV 2 g (11/30/18 0915)     LOS: 5 days      Calvert Cantor, MD [facility] Pager: [email or web address] Password [email or web address] 11/30/2018, 1:37 PM

## 2018-11-30 NOTE — Plan of Care (Signed)
  Problem: RH BOWEL ELIMINATION Goal: RH STG MANAGE BOWEL WITH ASSISTANCE Description STG Manage Bowel with Assistance. Mod  Outcome: Progressing   Problem: RH BLADDER ELIMINATION Goal: RH STG MANAGE BLADDER WITH ASSISTANCE Description STG Manage Bladder With Assistance. Min  Outcome: Progressing   Problem: RH SKIN INTEGRITY Goal: RH STG SKIN FREE OF INFECTION/BREAKDOWN Description Free of skin breakdown, infection with mod assist  Outcome: Progressing

## 2018-11-30 NOTE — Progress Notes (Signed)
Patient reported to be feeling bad--HR reported to be in 130's range--question reaction to blood v/s sinus tachycardia. EKG ordered. CXR ordered to rule out overload as lasix held for past 2 days as well as blood today. CXR with minimal fluid. Still awaiting EKG.

## 2018-11-30 NOTE — Progress Notes (Signed)
[place] PHYSICAL MEDICINE & REHABILITATION PROGRESS NOTE   Subjective/Complaints:  Pt concerned about his blood count, discussed with IM Dr Butler Denmark.  ROS- Denies CP, SOB, N/V/D  Objective:   No results found. Recent Labs    11/30/18 0451  WBC 7.7  HGB 6.9*  HCT 23.5*  PLT 224   Recent Labs    11/30/18 0451  NA 140  K 3.4*  CL 107  CO2 24  GLUCOSE 105*  BUN 28*  CREATININE 0.99  CALCIUM 8.2*    Intake/Output Summary (Last 24 hours) at 11/30/2018 0850 Last data filed at 11/30/2018 0700 Gross per 24 hour  Intake 1837.43 ml  Output 850 ml  Net 987.43 ml     Physical Exam: Vital Signs Blood pressure 103/77, pulse (!) 109, temperature 98.6 F (37 C), temperature source Oral, resp. rate (!) 21, height 6' 0.4" (1.839 m), weight 121.6 kg, SpO2 97 %. Constitutional: No distress . Vital signs reviewed. HENT: Normocephalic.  Atraumatic. Eyes: EOMI.  No discharge. Cardiovascular: No JVD. Respiratory: Normal effort. GI: Non-distended. Musc: Edema in bilateral feet Neurologic: Alert Motor: Bilateral upper extremities: 4/5 proximal distal Bilateral lower extremities:  Right lower extremity: Hip flexion 2/5, knee extension 2+/5, ankle dorsiflexion 3/5, stable Left lower extremity: Hip flexion 3-/5, knee extension 3/5, ankle dorsiflexion 4/5, stable Skin: Lower extremities and Unna boots  Assessment/Plan: 1. Functional deficits secondary to debility secondary to CHF and empyema which require 3+ hours per day of interdisciplinary therapy in a comprehensive inpatient rehab setting.  Physiatrist is providing close team supervision and 24 hour management of active medical problems listed below.  Physiatrist and rehab team continue to assess barriers to discharge/monitor patient progress toward functional and medical goals  Care Tool:  Bathing  Bathing activity did not occur: Refused Body parts bathed by patient: Right arm, Left arm, Chest, Abdomen, Face   Body parts  bathed by helper: Right lower leg, Left lower leg Body parts n/a: Front perineal area, Buttocks   Bathing assist Assist Level: Minimal Assistance - Patient > 75%     Upper Body Dressing/Undressing Upper body dressing   What is the patient wearing?: Pull over shirt    Upper body assist Assist Level: Minimal Assistance - Patient > 75%    Lower Body Dressing/Undressing Lower body dressing      What is the patient wearing?: Pants     Lower body assist Assist for lower body dressing: 2 Helpers     Toileting Toileting    Toileting assist Assist for toileting: Total Assistance - Patient < 25%     Transfers Chair/bed transfer  Transfers assist  Chair/bed transfer activity did not occur: Safety/medical concerns  Chair/bed transfer assist level: Total Assistance - Patient < 25%     Locomotion Ambulation   Ambulation assist   Ambulation activity did not occur: Safety/medical concerns          Walk 10 feet activity   Assist  Walk 10 feet activity did not occur: Safety/medical concerns        Walk 50 feet activity   Assist Walk 50 feet with 2 turns activity did not occur: Safety/medical concerns         Walk 150 feet activity   Assist Walk 150 feet activity did not occur: Safety/medical concerns         Walk 10 feet on uneven surface  activity   Assist Walk 10 feet on uneven surfaces activity did not occur: Safety/medical concerns  Wheelchair     Assist Will patient use wheelchair at discharge?: No   Wheelchair activity did not occur: Safety/medical concerns         Wheelchair 50 feet with 2 turns activity    Assist    Wheelchair 50 feet with 2 turns activity did not occur: Safety/medical concerns       Wheelchair 150 feet activity     Assist Wheelchair 150 feet activity did not occur: Safety/medical concerns        Medical Problem List and Plan: 1.  Deficits with mobility, transfers, endurance,  weakness secondary to multifactorial debility.  Continue CIR PT, OT,  2.  Antithrombotics: -DVT/anticoagulation:  Pharmaceutical: Other (comment)--Eliquis. Continue to monitor hemoglobin as well as signs of bleeding.             -antiplatelet therapy: N/A 3. Pain Management: Tylenol prn 4. Mood: LCSW to follow for evaluate              -antipsychotic agents: N/A 5. Neuropsych: This patient is capable of making decisions on his own behalf. 6. Skin/Wound Care: Routine pressure relief measures.  7. Fluids/Electrolytes/Nutrition: Monitor I/O. Continue ensure bid.   Increasing BUN/creatinine ratio, encourage fluids   8. Empyema due to E coli: Continue ampicillin --Antibiotic day #15/28.  CT chest prior to d/c antibiotics? 9. ABLA due to GIB/spontaneous thigh hematoma: Continue protonix bid. Received 1 unit PRBC 4/24 and feraheme 4/26.   Now on iron supplement.  Added folic acid due to low folate levels.    Hemoglobin 7.2 on 4/30, labs ordered for tomorrow 10. ICM/Acute systolic CHF: Monitor daily weights.  Monitor for signs/symptoms of fluid overload.  BP remains soft--no ARB. Continue coreg and Crestor.  Filed Weights   11/28/18 0421 11/29/18 0513 11/30/18 0612  Weight: 119.4 kg 117.9 kg 121.6 kg   Improving on 5/4 11. 3 vessel CAD: Not a surgical candidate-treated medically 12. Paroxymal A Fib with RVR: Monitor HR bid-continue amiodarone, coreg and Eliquis. Cards recommends re-eval prior to discharge.  13. Bradycardia: Heart rate improving with decrease in Coreg and d/c of digoxin.  Monitor with increased physical activity. 14. Protein calorie malnutrition: Continue to offer supplements tid. Magnesium supplemented yesterday due to low normal levels.  Added Mg supplement..  15. Orthostatic hypotension: Keep HOB > 30 degrees. Continue Unna boots for edema control and BP support.  Abdominal binder if needed  Lasix being held per IM, discussed with Dr Butler Denmark, will see if orthostsis improves after  transfusion LOS: 5 days A FACE TO FACE EVALUATION WAS PERFORMED  Erick Colace 11/30/2018, 8:50 AM

## 2018-12-01 ENCOUNTER — Inpatient Hospital Stay (HOSPITAL_COMMUNITY): Payer: Self-pay

## 2018-12-01 LAB — CBC
HCT: 24.9 % — ABNORMAL LOW (ref 39.0–52.0)
Hemoglobin: 7.5 g/dL — ABNORMAL LOW (ref 13.0–17.0)
MCH: 29.8 pg (ref 26.0–34.0)
MCHC: 30.1 g/dL (ref 30.0–36.0)
MCV: 98.8 fL (ref 80.0–100.0)
Platelets: 246 10*3/uL (ref 150–400)
RBC: 2.52 MIL/uL — ABNORMAL LOW (ref 4.22–5.81)
RDW: 22.6 % — ABNORMAL HIGH (ref 11.5–15.5)
WBC: 9.7 10*3/uL (ref 4.0–10.5)
nRBC: 0 % (ref 0.0–0.2)

## 2018-12-01 LAB — MAGNESIUM: Magnesium: 2.3 mg/dL (ref 1.7–2.4)

## 2018-12-01 LAB — BASIC METABOLIC PANEL
Anion gap: 10 (ref 5–15)
BUN: 38 mg/dL — ABNORMAL HIGH (ref 6–20)
CO2: 24 mmol/L (ref 22–32)
Calcium: 8.6 mg/dL — ABNORMAL LOW (ref 8.9–10.3)
Chloride: 106 mmol/L (ref 98–111)
Creatinine, Ser: 1.14 mg/dL (ref 0.61–1.24)
GFR calc Af Amer: >60 mL/min (ref >60)
GFR calc non Af Amer: >60 mL/min (ref >60)
Glucose, Bld: 108 mg/dL — ABNORMAL HIGH (ref 70–99)
Potassium: 5 mmol/L (ref 3.5–5.1)
Sodium: 140 mmol/L (ref 135–145)

## 2018-12-01 LAB — TYPE AND SCREEN
ABO/RH(D): A POS
Antibody Screen: NEGATIVE
Unit division: 0

## 2018-12-01 LAB — BPAM RBC
Blood Product Expiration Date: 202005052359
ISSUE DATE / TIME: 202005041247
Unit Type and Rh: 6200

## 2018-12-01 MED ORDER — FUROSEMIDE 40 MG PO TABS
40.0000 mg | ORAL_TABLET | Freq: Every day | ORAL | Status: DC
  Administered 2018-12-01 – 2018-12-02 (×2): 40 mg via ORAL
  Filled 2018-12-01 (×2): qty 1

## 2018-12-01 MED ORDER — CHLORHEXIDINE GLUCONATE CLOTH 2 % EX PADS
6.0000 | MEDICATED_PAD | Freq: Two times a day (BID) | CUTANEOUS | Status: DC
  Administered 2018-12-01 – 2018-12-18 (×30): 6 via TOPICAL

## 2018-12-01 MED ORDER — METOPROLOL TARTRATE 5 MG/5ML IV SOLN
5.0000 mg | INTRAVENOUS | Status: DC | PRN
  Filled 2018-12-01: qty 5

## 2018-12-01 MED ORDER — MIDODRINE HCL 5 MG PO TABS
5.0000 mg | ORAL_TABLET | Freq: Three times a day (TID) | ORAL | Status: DC
  Administered 2018-12-01 – 2018-12-18 (×50): 5 mg via ORAL
  Filled 2018-12-01 (×50): qty 1

## 2018-12-01 MED ORDER — METOPROLOL TARTRATE 5 MG/5ML IV SOLN
5.0000 mg | Freq: Once | INTRAVENOUS | Status: AC
  Administered 2018-12-01: 5 mg via INTRAVENOUS
  Filled 2018-12-01: qty 5

## 2018-12-01 NOTE — Progress Notes (Signed)
Physical Therapy Session Note  Patient Details  Name: Aaron [name] MRN: [id number] Date of Birth: June 11, 1966  Today's Date: 12/01/2018 PT Individual Time:Session1:  [id number]; [name]: [id number] PT Individual Time Calculation (min): 41 min; 42 min  Short Term Goals: Week 1:  PT Short Term Goal 1 (Week 1): Pt will perform bed mobliity with mod assist  PT Short Term Goal 2 (Week 1): Pt will propell WC with min assist x 158ft.  PT Short Term Goal 3 (Week 1): Pt will perform bed<>WC transfer with max assist +2   Skilled Therapeutic Interventions/Progress Updates:   Session1: Patient in TIS w/c.  Reports rough night due to hypoxia and they tried c-pap without success.  Assisted in w/c on 2L O2 [place] to dayroom.  Patient performed seated stepping on Kinetron @ 45 cm/sec x 1 minute bouts x 4 bouts with encouragement and 1-2 minute rest breaks in between.  Vitals as below.  Patient in w/c to perform LAQ (with A and focus on eccentric control on R), hamstring curls with yellow t-band, hip flexion (w/ A on R), hip abduction with yellow band around legs, and hip adductor sets with pt squeezing against PT's fists side by side x 5 sec hold.  Patient assisted in w/c to room and left in tilted position to pressure relieve bottom with call bell in reach and RN in room.   Session2:  Patient in w/c reports feeling a little clamy.  Noted some diaphoresis and SOB, though vitals stable on continuous pulse ox with pt on 2 LPM O2.  Patient assisted to dayroom in w/c.  Performed slide board transfer to mat with A to place board and +2 pt = about 60% initially, then fatigued to slide to mat.  Patient c/o continued fatigue and decreased strength overall.  Requesting to get back to room and back to bed due to fatigue and needing to toilet.  Assisted to slide back to w/c +2 A with pt about 40%.  SBT back to bed in room and sit to supine mod A for LE's and positioning in supine pt rolling with min a to flex knee to R and L to  straighten pad under him.  Replaced on continuous pulse ox and O2 in room.  Call bell in place and four side rails up on tilt bed.  Pt missed 30 minutes of skilled PT due to fatigue.   Therapy Documentation Precautions:  Precautions Precautions: Fall Precaution Comments: monitor BP, hypotensive in sitting/standing Required Braces or Orthoses: Other Brace Other Brace: RLE Bledsoe brace locked in extension when ambulating that can be d/c'd when pt can perform 10 SLRs without assist Restrictions Weight Bearing Restrictions: No RLE Weight Bearing: Weight bearing as tolerated(Bledsoe Brace OOB) LLE Weight Bearing: Weight bearing as tolerated General:  Missed 30 minutes of skilled PT due to fatigue Vital Signs: Therapy Vitals Pulse Rate: (!) 111 Resp: (!) 28 BP: 100/65 Oxygen Therapy SpO2: 94 % O2 Device: Nasal Cannula O2 Flow Rate (L/min): 2 L/min Patient Activity (if Appropriate): In chair Pain: Pain Assessment Pain Scale: 0-10 Pain Score: 0-No pain    Therapy/Group: Individual Therapy  [name]  [place], PT 12/01/2018, 12:03 PM

## 2018-12-01 NOTE — Progress Notes (Signed)
[place] PHYSICAL MEDICINE & REHABILITATION PROGRESS NOTE   Subjective/Complaints:  Appreciate IM consult Pt states last noc he had "anxiwty attack" Reportedly did not tolerate CPAP trial O2 sats 94% on 2.5 L this am Pt denies dizziness  ROS- Denies CP, SOB, N/V/D  Objective:   Dg Chest Port 1v Same Day  Result Date: 11/30/2018 CLINICAL DATA:  Tachycardia EXAM: PORTABLE CHEST 1 VIEW COMPARISON:  11/24/2018 FINDINGS: Small right pleural effusion. Bilateral interstitial thickening slightly worse on the right. Mild right basilar atelectasis. No pneumothorax. Stable cardiomegaly. Left-sided PICC line with the tip projecting over the SVC. No acute osseous abnormality. IMPRESSION: Cardiomegaly with mild pulmonary vascular congestion. Stable right pleural effusion right basilar atelectasis. Electronically Signed   By: Elige Ko   On: 11/30/2018 21:11   Recent Labs    11/30/18 1557 12/01/18 0347  WBC 8.1 9.7  HGB 7.6* 7.5*  HCT 25.5* 24.9*  PLT 228 246   Recent Labs    11/30/18 0451 12/01/18 0347  NA 140 140  K 3.4* 5.0  CL 107 106  CO2 24 24  GLUCOSE 105* 108*  BUN 28* 38*  CREATININE 0.99 1.14  CALCIUM 8.2* 8.6*    Intake/Output Summary (Last 24 hours) at 12/01/2018 0938 Last data filed at 12/01/2018 0857 Gross per 24 hour  Intake 2029.07 ml  Output 500 ml  Net 1529.07 ml     Physical Exam: Vital Signs Blood pressure 98/74, pulse 98, temperature 98.4 F (36.9 C), temperature source Oral, resp. rate (!) 28, height 6' 0.4" (1.839 m), weight 118.8 kg, SpO2 94 %. Constitutional: No distress . Vital signs reviewed. HENT: Normocephalic.  Atraumatic. Eyes: EOMI.  No discharge. Cardiovascular: No JVD. Respiratory: Normal effort. GI: Non-distended. Musc: Edema in bilateral feet Neurologic: Alert Motor: Bilateral upper extremities: 4/5 proximal distal Bilateral lower extremities:  Right lower extremity: Hip flexion 2/5, knee extension 2+/5, ankle dorsiflexion 3/5,  stable Left lower extremity: Hip flexion 3-/5, knee extension 3/5, ankle dorsiflexion 4/5, stable Skin: Lower extremities and Unna boots  Assessment/Plan: 1. Functional deficits secondary to debility secondary to CHF and empyema which require 3+ hours per day of interdisciplinary therapy in a comprehensive inpatient rehab setting.  Physiatrist is providing close team supervision and 24 hour management of active medical problems listed below.  Physiatrist and rehab team continue to assess barriers to discharge/monitor patient progress toward functional and medical goals  Care Tool:  Bathing  Bathing activity did not occur: Refused Body parts bathed by patient: Right arm, Left arm, Chest, Abdomen, Face   Body parts bathed by helper: Right lower leg, Left lower leg Body parts n/a: Front perineal area, Buttocks   Bathing assist Assist Level: Minimal Assistance - Patient > 75%     Upper Body Dressing/Undressing Upper body dressing   What is the patient wearing?: Pull over shirt    Upper body assist Assist Level: Minimal Assistance - Patient > 75%    Lower Body Dressing/Undressing Lower body dressing      What is the patient wearing?: Pants, Incontinence brief     Lower body assist Assist for lower body dressing: Total Assistance - Patient < 25%     Toileting Toileting    Toileting assist Assist for toileting: Total Assistance - Patient < 25%     Transfers Chair/bed transfer  Transfers assist  Chair/bed transfer activity did not occur: Safety/medical concerns  Chair/bed transfer assist level: 2 Helpers(slide board)     Locomotion Ambulation   Ambulation assist  Ambulation activity did not occur: Safety/medical concerns          Walk 10 feet activity   Assist  Walk 10 feet activity did not occur: Safety/medical concerns        Walk 50 feet activity   Assist Walk 50 feet with 2 turns activity did not occur: Safety/medical concerns          Walk 150 feet activity   Assist Walk 150 feet activity did not occur: Safety/medical concerns         Walk 10 feet on uneven surface  activity   Assist Walk 10 feet on uneven surfaces activity did not occur: Safety/medical concerns         Wheelchair     Assist Will patient use wheelchair at discharge?: No   Wheelchair activity did not occur: Safety/medical concerns         Wheelchair 50 feet with 2 turns activity    Assist    Wheelchair 50 feet with 2 turns activity did not occur: Safety/medical concerns       Wheelchair 150 feet activity     Assist Wheelchair 150 feet activity did not occur: Safety/medical concerns        Medical Problem List and Plan: 1.  Deficits with mobility, transfers, endurance, weakness secondary to multifactorial debility.  Continue CIR PT, OT, team conf in am    2.  Antithrombotics: -DVT/anticoagulation:  Pharmaceutical: Other (comment)--Eliquis. Continue to monitor hemoglobin as well as signs of bleeding.             -antiplatelet therapy: N/A 3. Pain Management: Tylenol prn 4. Mood: LCSW to follow for evaluate              -antipsychotic agents: N/A 5. Neuropsych: This patient is capable of making decisions on his own behalf. 6. Skin/Wound Care: Routine pressure relief measures.  7. Fluids/Electrolytes/Nutrition: Monitor I/O. Continue ensure bid.   Increasing BUN/creatinine ratio, encourage fluids   8. Empyema due to E coli: Continue ampicillin --Antibiotic day #16/28.  CT chest prior to d/c antibiotics? 9. ABLA due to GIB/spontaneous thigh hematoma: Continue protonix bid. Received 1 unit PRBC 4/24 and feraheme 4/26.   Now on iron supplement.  Added folic acid due to low folate levels.    Hemoglobin 7.2 on 4/30, labs ordered for tomorrow 10. ICM/Acute systolic CHF: Monitor daily weights.  Monitor for signs/symptoms of fluid overload.  BP remains soft--no ARB. Continue coreg and Crestor.  Filed Weights    11/29/18 0513 11/30/18 0612 12/01/18 0431  Weight: 117.9 kg 121.6 kg 118.8 kg   Fluctuating ? technique 11. 3 vessel CAD: Not a surgical candidate-treated medically 12. Paroxymal A Fib with RVR: Monitor HR bid-continue amiodarone, coreg and Eliquis. Cards recommends re-eval prior to discharge.  13. Bradycardia: Heart rate improving with decrease in Coreg and d/c of digoxin.  Monitor with increased physical activity. 14. Protein calorie malnutrition: Continue to offer supplements tid. Magnesium supplemented yesterday due to low normal levels.  Added Mg supplement..  15. Orthostatic hypotension: Keep HOB > 30 degrees. Continue Unna boots for edema control and BP support.  Abdominal binder if needed  Fluid management per IM, change Unna boots weekly LOS: 6 days A FACE TO FACE EVALUATION WAS PERFORMED  Erick Colace 12/01/2018, 9:38 AM

## 2018-12-01 NOTE — Progress Notes (Signed)
Shift event: Noted pt was desatting and having periods of apnea during sleep. NP to bedside as [facility] is consulting. Pt is resting with eyes closed upon NP entering room and his O2 sat is 75%. He arouses immediately with name calling. O2 sat jumps to 95% on 1L. Denies SOB or CP. States he has never had a sleep study.  Believe this to be sleep apnea. Bump up to 3L per  and trial CPAP. Pt in agreement.  KJKG, NP [facility]

## 2018-12-01 NOTE — Progress Notes (Signed)
Pt refuses cpap. RT will monitor. 

## 2018-12-01 NOTE — Progress Notes (Signed)
Spoke with Marissa Nestle NP earlier in shift for pt c/o sob. ECG completed and orders obtained. Later in shift pt had extended periods of sleep apnea with decrease in pulse ox. [name] recalled and rapid up to evaluate. Decision made to apply CPAP to pt with continuous pulse oximetry. Pt refused CPAP however continuous pulse oximetry was applied. Pt continues to be monitored.

## 2018-12-01 NOTE — Progress Notes (Signed)
This RN able to assess patient's skin during unna boot removal. Skin dry and intact. Very dry/flaky skin noted to bilateral feet. Legs rewrapped with ace wraps. Continue to monitor.

## 2018-12-01 NOTE — Progress Notes (Addendum)
PROGRESS NOTE    Aaron [name] Winning   ZOX:[id number]  DOB: 1965-12-03  DOA: 11/25/2018 PCP: Darrin Nipperollege, [facility] [place]   Brief Narrative:  Aaron [name] Nudo is an 53 y.o. male with morbid obesity; CAD (not a candidate for CABG); afib; and acute systolic CHF.  He was admitted from 3/5-4/9 for acute systolic CHF with EF 10-15% and anasarca; he developed an upper GI bleed while hospitalized requiring 1 unit PRBC.  He was discharged to CIR but developed SOB again on 4/16 and was transferred back to inpatient. He was found to have a  R-sided empyema that grew E. Coli, for which he was recommended to be treated with Ampicillin through 5/7.   He was discharged back to CIR on 4/29. [facility] is following for medical management.   Subjective: He had a panic attack last night but he is not sure why he was panicking. He thinks it was because he was becoming hypoxic. Patient was noted to be hypoxic when he was falling asleep last night and declined to use CPAP. His pulse ox would go back to 90s when he was awakened. It was decided to place him on O2 via NA and a continuous pulse ox was placed. Also noted to go into A-fib last night.   Assessment & Plan:   Active Problems:   Debility - management per rehab staff  Right e coli empyema - cont Ampicillin until 5/7-    Anemia of chronic disease - Iron and folic acid deficiency -   transfused 1 U PRBC yesterday followed by a Feraheme transfusion - cont oral Folic acid and Iron TID  Chronic systolic CHF with EF of 15 % -  He was becoming orthostatic and weight was dropping quickly thus, Lasix was held for 2 days- will resume Lasix today  A-fib - on Coreg and Amiodarone for rate control - on Eliquis for secondary stroke prevention - RVR was noted last night and the PA called and spoke with cardiology who recommended that Coreg be increased to 3.125 BID (from daily) - HR noted to be rapid when I evaluated him this AM and I gave him a  dose of 5 mg IV Metoprolol- HR currently in 90s-low 100s - PRN Metoprolol ordered  Orthostatic hypotension - per the therapists, he has been orthostatic since he has been up in rehab- I will add Midodrine today at 5 mg TID- it can cause bradycardia which may help with above A-fib with RVR - follow HR  Hypoxia when falling asleep - resume Lasix and follow- may need a sleep study- cont with the continuous pulse oximetry.  CAD - medical management - not a candidate for CABG  Pedal edema - currently in UNNA boots which are to be changed on Thursdays- according to patient, he did not have any ulcers on his legs - I have asked for UNNA boots to be removed- it is better to have these off and ACE wraps being done daily instead to allow us to be able to examine and clean his skin.  Time spent in minutes: 35 min DVT prophylaxis: Eliquis  Antimicrobials:  Anti-infectives (From admission, onward)   Start     Dose/Rate Route Frequency Ordered Stop   11/25/18 1800  ampicillin (OMNIPEN) 2 g in sodium chloride 0.9 % 100 mL IVPB     2 g 300 mL/hr over 20 Minutes Intravenous Every 6 hours 11/25/18 1552         Objective: Vitals:   12/01/18 1103  12/01/18 1201 12/01/18 1316 12/01/18 1420  BP:  100/65  91/76  Pulse: (!) 102 (!) 111 (!) 111 (!) 103  Resp:   (!) 22 19  Temp:    97.8 F (36.6 C)  TempSrc:    Oral  SpO2: 97% 94% 96% 97%  Weight:      Height:        Intake/Output Summary (Last 24 hours) at 12/01/2018 1502 Last data filed at 12/01/2018 1448 Gross per 24 hour  Intake 1795.07 ml  Output 700 ml  Net 1095.07 ml   Filed Weights   11/29/18 0513 11/30/18 0612 12/01/18 0431  Weight: 117.9 kg 121.6 kg 118.8 kg    Examination: General exam: Appears comfortable  HEENT: PERRLA, oral mucosa moist, no sclera icterus or thrush Respiratory system: crackles in LLL-  Respiratory effort normal. Cardiovascular system: S1 & S2 heard, IIRR    Gastrointestinal system: Abdomen soft, non-tender,  nondistended. Normal bowel sounds   Central nervous system: Alert and oriented. No focal neurological deficits. Extremities: UNNA boots on both legs Skin: No rashes or ulcers Psychiatry:  Mood & affect appropriate.     Data Reviewed: I have personally reviewed following labs and imaging studies  CBC: Recent Labs  Lab 11/26/18 0512 11/30/18 0451 11/30/18 1557 12/01/18 0347  WBC 8.2 7.7 8.1 9.7  NEUTROABS 6.2  --  6.8  --   HGB 7.2* 6.9* 7.6* 7.5*  HCT 23.9* 23.5* 25.5* 24.9*  MCV 99.6 100.4* 97.7 98.8  PLT 214 224 228 246   Basic Metabolic Panel: Recent Labs  Lab 11/26/18 0512 11/30/18 0451 12/01/18 0347  NA 138 140 140  K 3.6 3.4* 5.0  CL 103 107 106  CO2 26 24 24   GLUCOSE 93 105* 108*  BUN 22* 28* 38*  CREATININE 1.00 0.99 1.14  CALCIUM 8.1* 8.2* 8.6*  MG 2.3  --  2.3   GFR: Estimated Creatinine Clearance: 99.1 mL/min (by C-G formula based on SCr of 1.14 mg/dL). Liver Function Tests: Recent Labs  Lab 11/26/18 0512  AST 20  ALT 22  ALKPHOS 82  BILITOT 0.8  PROT 7.5  ALBUMIN 1.9*   No results for input(s): LIPASE, AMYLASE in the last 168 hours. No results for input(s): AMMONIA in the last 168 hours. Coagulation Profile: No results for input(s): INR, PROTIME in the last 168 hours. Cardiac Enzymes: No results for input(s): CKTOTAL, CKMB, CKMBINDEX, TROPONINI in the last 168 hours. BNP (last 3 results) No results for input(s): PROBNP in the last 8760 hours. HbA1C: No results for input(s): HGBA1C in the last 72 hours. CBG: No results for input(s): GLUCAP in the last 168 hours. Lipid Profile: No results for input(s): CHOL, HDL, LDLCALC, TRIG, CHOLHDL, LDLDIRECT in the last 72 hours. Thyroid Function Tests: No results for input(s): TSH, T4TOTAL, FREET4, T3FREE, THYROIDAB in the last 72 hours. Anemia Panel: No results for input(s): VITAMINB12, FOLATE, FERRITIN, TIBC, IRON, RETICCTPCT in the last 72 hours. Urine analysis:    Component Value Date/Time    COLORURINE AMBER (A) 10/01/2018 1523   APPEARANCEUR HAZY (A) 10/01/2018 1523   LABSPEC 1.023 10/01/2018 1523   PHURINE 5.0 10/01/2018 1523   GLUCOSEU NEGATIVE 10/01/2018 1523   HGBUR SMALL (A) 10/01/2018 1523   BILIRUBINUR NEGATIVE 10/01/2018 1523   KETONESUR NEGATIVE 10/01/2018 1523   PROTEINUR NEGATIVE 10/01/2018 1523   NITRITE NEGATIVE 10/01/2018 1523   LEUKOCYTESUR NEGATIVE 10/01/2018 1523   Sepsis Labs: @LABRCNTIP (procalcitonin:4,lacticidven:4) )No results found for this or any previous visit (from the past  240 hour(s)).       Radiology Studies: Dg Chest Port 1v Same Day  Result Date: 11/30/2018 CLINICAL DATA:  Tachycardia EXAM: PORTABLE CHEST 1 VIEW COMPARISON:  11/24/2018 FINDINGS: Small right pleural effusion. Bilateral interstitial thickening slightly worse on the right. Mild right basilar atelectasis. No pneumothorax. Stable cardiomegaly. Left-sided PICC line with the tip projecting over the SVC. No acute osseous abnormality. IMPRESSION: Cardiomegaly with mild pulmonary vascular congestion. Stable right pleural effusion right basilar atelectasis. Electronically Signed   By: Elige Ko   On: 11/30/2018 21:11      Scheduled Meds: . amiodarone  200 mg Oral Daily  . apixaban  5 mg Oral BID  . carvedilol  3.125 mg Oral BID WC  . Chlorhexidine Gluconate Cloth  6 each Topical BID  . feeding supplement (ENSURE ENLIVE)  237 mL Oral TID BM  . feeding supplement (PRO-STAT SUGAR FREE 64)  30 mL Oral BID  . ferrous sulfate  325 mg Oral TID WC  . folic acid  1 mg Oral Daily  . furosemide  40 mg Oral Daily  . magnesium oxide  400 mg Oral BID  . pantoprazole  40 mg Oral BID AC  . polyethylene glycol  17 g Oral BID  . rosuvastatin  20 mg Oral q1800   Continuous Infusions: . sodium chloride Stopped (11/29/18 9485)  . ampicillin (OMNIPEN) IV 2 g (12/01/18 1000)     LOS: 6 days      Calvert Cantor, MD [facility] Pager: [email or web address] Password [email or web address] 12/01/2018, 3:02  PM

## 2018-12-01 NOTE — Progress Notes (Signed)
Orthopedic Tech Progress Note Patient Details:  Aaron [name] 12-26-1965 [id number] RN called and said DR D/C the unna boots and could I come remove them and apply ace wrap. Ortho Devices Type of Ortho Device: Ace wrap Ortho Device/Splint Location: bilateral Ortho Device/Splint Interventions: Adjustment, Application, Ordered, Removal   Post Interventions Patient Tolerated: Well Instructions Provided: Care of device, Adjustment of device   [name] 12/01/2018, 4:39 PM

## 2018-12-01 NOTE — Progress Notes (Signed)
Occupational Therapy Session Note  Patient Details  Name: Aaron [name] MRN: [id number] Date of Birth: [birth year]  Today's Date: 12/01/2018 OT Individual Time: 0945-1100 OT Individual Time Calculation (min): 75 min    Short Term Goals: Week 1:  OT Short Term Goal 1 (Week 1): Pt will complete BSC transfer with 2 assist  OT Short Term Goal 2 (Week 1): Pt will complete LB dressing sit<stand with 2 assist using LRAD to work on standing tolerance  OT Short Term Goal 3 (Week 1): Pt will complete 1/3 components of donning pants using AE as needed   Skilled Therapeutic Interventions/Progress Updates:    Pt received supine with no c/o pain. Pt completed peri hygiene bed level with set up assist, min A to roll thoroughly and reach posteriorly. Pt donned pants with max +2 assistance, and was able to maintain brief glute bridge. Pt required frequent rest breaks d/t tachycardia and brief desaturation in SpO2 to 93% on 2L [place]. Pt completed rolling R and L with min A. Pt transitioned to EOB with mod lifting A from sidelying. Pt requiring assistance to move R LE during all bed mobility. Pt maintained EOB balance with (S). BP 111/75, HR 118, 99% on 2L [place] EOB. Pt declined use of slideboard to chair and preferred maxi-move. Discussed importance of progressing activity but pt still declining. Pt returned to supine and hoyer sling was placed. Pt was transferred into TIS w/c with maxi move. Pt brought to sink and completed oral care with set up. Pt ended session with brief BUE strengthening circuit with 4# dumbbells, fatiguing quickly. Pt left sitting up with seatbelt fastened and all needs met. BP: 99/79, 110 bpm, 97% on 2L [place].   Therapy Documentation Precautions:  Precautions Precautions: Fall Precaution Comments: monitor BP, hypotensive in sitting/standing Required Braces or Orthoses: Other Brace Other Brace: RLE Bledsoe brace locked in extension when ambulating that can be d/c'd when pt can perform 10 SLRs without  assist Restrictions Weight Bearing Restrictions: No RLE Weight Bearing: Weight bearing as tolerated(Bledsoe Brace OOB) LLE Weight Bearing: Weight bearing as tolerated   Vital Signs: Therapy Vitals Pulse Rate: (!) 111 Resp: (!) 28 BP: 107/72 Oxygen Therapy SpO2: 97 % O2 Device: Nasal Cannula O2 Flow Rate (L/min): 2 L/min Pain: Pain Assessment Pain Scale: 0-10 Pain Score: 0-No pain   Therapy/Group: Individual Therapy  Curtis Sites 12/01/2018, 10:48 AM

## 2018-12-01 NOTE — Significant Event (Signed)
Rapid Response Event Note Called by Rehab staff for apnea and periodic breathing pattern with desats 60-70%  Overview: Time Called: 0051 Arrival Time: 0054 Event Type: Respiratory  Initial Focused Assessment: Aaron [name] is an ill looking gentlemen who is having periods of symptomatic apnea episodes up to a minute in length. His oxygen saturations drop to 65% while on 2LNC. He wakes easily and is oriented x4. He denies CP and SOB. He is not having pain. Color is pale, slightly jaundice, cool and dry.  He has anasarca and pitting edema. 12 lead EKG and PCXR were previously ordered and completed. Aflutter in 120s, BP 112/79 (91), RR 16 with sats 94-96% on 2L [place] while awake. I witnessed several apnea episodes while at bedside where Aaron [name] would desaturate to the 60s-70s and wake up easily and come back to sats in the 90s.   Maren Reamer notified and came to bedside. [name] Love notified by rehab staff over the phone.  Addendum: Pt is refusing CPAP. He states it is uncomfortable because it is forcing him to breathe. He is placed back on [place] with continuous pulse oximetry.  Interventions: -CPAP -Continuous pulse oximetry  Plan of Care (if not transferred): -Monitor pulse ox and for further apnea  -Call [facility] and RRRN if any other further assistance needed.  Event Summary: Name of Physician Notified: Jimmye Norman NP at 0136    at    Outcome: Stayed in room and stabalized     Rose Fillers

## 2018-12-01 NOTE — Progress Notes (Signed)
Rt called to place pt on cpap due to apnea spells at 60sec and greater and sats dropping to 60%. RT placed pt on cpap after speaking with pt about the device and he was ok with RT placing it on his face.  RT placed mask and within 10 secs pt started ripping mask off and said "get this shit off me.'  When I said to him, "this is going to help you breathe" he said, "I don't care, get it off me.'  RT spoke with Rapid Response, David, and explained what had happened at which point we walked back into the room together and pt told rapid again, "Hell no, I'm not wearing that.'  RT left device in room at this time in case he changes his mind.  RT will continue to monitor.

## 2018-12-01 NOTE — Plan of Care (Signed)
  Problem: RH BOWEL ELIMINATION Goal: RH STG MANAGE BOWEL WITH ASSISTANCE Description STG Manage Bowel with Assistance. Mod  Outcome: Progressing   Problem: RH BLADDER ELIMINATION Goal: RH STG MANAGE BLADDER WITH ASSISTANCE Description STG Manage Bladder With Assistance. Min  Outcome: Progressing   Problem: RH SKIN INTEGRITY Goal: RH STG SKIN FREE OF INFECTION/BREAKDOWN Description Free of skin breakdown, infection with mod assist  Outcome: Progressing   Problem: RH PAIN MANAGEMENT Goal: RH STG PAIN MANAGED AT OR BELOW PT'S PAIN GOAL Description No pain or less than 3  Outcome: Progressing   

## 2018-12-02 ENCOUNTER — Inpatient Hospital Stay (HOSPITAL_COMMUNITY): Payer: Self-pay | Admitting: Physical Therapy

## 2018-12-02 ENCOUNTER — Inpatient Hospital Stay (HOSPITAL_COMMUNITY): Payer: Self-pay | Admitting: Occupational Therapy

## 2018-12-02 ENCOUNTER — Inpatient Hospital Stay (HOSPITAL_COMMUNITY): Payer: Medicaid Other

## 2018-12-02 DIAGNOSIS — I4891 Unspecified atrial fibrillation: Secondary | ICD-10-CM

## 2018-12-02 DIAGNOSIS — I480 Paroxysmal atrial fibrillation: Secondary | ICD-10-CM

## 2018-12-02 DIAGNOSIS — I5022 Chronic systolic (congestive) heart failure: Secondary | ICD-10-CM

## 2018-12-02 MED ORDER — FUROSEMIDE 10 MG/ML IJ SOLN
40.0000 mg | Freq: Once | INTRAMUSCULAR | Status: AC
  Administered 2018-12-02: 40 mg via INTRAVENOUS
  Filled 2018-12-02: qty 4

## 2018-12-02 MED ORDER — AMIODARONE HCL 200 MG PO TABS
200.0000 mg | ORAL_TABLET | Freq: Two times a day (BID) | ORAL | Status: DC
  Administered 2018-12-02 – 2018-12-18 (×32): 200 mg via ORAL
  Filled 2018-12-02 (×32): qty 1

## 2018-12-02 MED ORDER — FUROSEMIDE 20 MG PO TABS
20.0000 mg | ORAL_TABLET | Freq: Every day | ORAL | Status: DC
  Administered 2018-12-03 – 2018-12-18 (×16): 20 mg via ORAL
  Filled 2018-12-02 (×16): qty 1

## 2018-12-02 NOTE — Progress Notes (Signed)
Nutrition Follow-up  RD working remotely.  DOCUMENTATION CODES:   Obesity unspecified  INTERVENTION:  - Continue Ensure Enlive po TID, each supplement provides 350 kcal and 20 grams of protein  - Continue Pro-stat 30 ml BID, each supplement provides 100 kcal and 15 grams of protein  - Add Magic Cup BID with meals due to variable PO intake at meal times, each supplement provides 290 kcal and 9 grams of protein  NUTRITION DIAGNOSIS:   Increased nutrient needs related to wound healing, chronic illness, other (therapies) as evidenced by estimated needs.  Ongoing, being addressed via oral nutrition supplements  GOAL:   Patient will meet greater than or equal to 90% of their needs  Progressing  MONITOR:   PO intake, Supplement acceptance, Weight trends, I & O's, Labs, Skin  REASON FOR ASSESSMENT:   Malnutrition Screening Tool    ASSESSMENT:   53 year old male without significant medical history who was originally admitted on 10/01/18 with 3-4 weeks history of progressive edema and SOB. Pt  was found to have fluid overload with anasarca, ascites, AKI, and abnormal LFTs. 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 Hospital course complicated by A-fib with RVR but patient developed spontaneous right thigh hematoma 3/18 as well as GIB due to esophageal ulcers with gastritis and esophagitis on 3/22. Pt also had issue with abdominal pain due to ileus treated with NGT. Pt was admitted to CIR 4/9 due to debility. On 4/16, pt reported productive cough as well as increase in SOB. CT chest ordered for work up and revealed right sided empyema with complete collapse of RLL and partial collapse of RML/RUL with reactive mediastinal lymph nodes. CT surgery and TRH were consulted for assistance and Pt was discharged to acute hospital for monitoring and treatment. Dr. Dorris Fetch recommended VIR chest tube placement. Pt underwent drainage of 800 cc thick brown purulent appearing fluid on 4/16 with  placement of PleurEvac and continued to have drainage. Chest tube removed on 4/27. Pt admitted back to CIR on 4/29.  Attempted to speak with pt via multiple phone calls to room but pt did not answer.  Weight fairly stable until today. Today's weight up overall 7 lbs since admission. Will continue to monitor trends. Reviewed RN edema assessment. Pt with mild pitting generalized edema and mild pitting edema to BLE and sacral region.  Will continue with current supplement regiment that includes Ensure Enlive and Pro-stat. Will also add Magic Cup BID with lunch and dinner meals due to variable PO intake at meal times. Will continue to monitor PO intake and adjust supplement regimen as appropriate.  Meal Completion: 0-100% x last 8 recorded meals (averaging ~50%)  Medications reviewed and include: Ensure Enlive TID (pt accepting >90%), Pro-stat 30 ml BID, ferrous sulfate, folic acid, Lasix 40 mg daily, magnesium oxide, Protonix, Miralax, IV abx  Labs reviewed: hemoglobin 7.5 (L)  UOP: 875 ml x 24 hours I/O's: +3.9 L since admit  Diet Order:   Diet Order            Diet Heart Room service appropriate? Yes with Assist; Fluid consistency: Thin  Diet effective now              EDUCATION NEEDS:   Not appropriate for education at this time  Skin:  Skin Assessment: Skin Integrity Issues: Stage II: right nare, right buttocks Incisions: closed incision to chest  Last BM:  11/30/18 large type 7  Height:   Ht Readings from Last 1 Encounters:  11/25/18  6' 0.4" (1.839 m)    Weight:   Wt Readings from Last 1 Encounters:  12/02/18 121.6 kg    Ideal Body Weight:  80.9 kg  BMI:  Body mass index is 35.95 kg/m.  Estimated Nutritional Needs:   Kcal:  2300-2500  Protein:  115-130 grams  Fluid:  >/= 2.0 L    Earma Reading, MS, RD, LDN Inpatient Clinical Dietitian Pager: [phone] Weekend/After Hours: [phone]

## 2018-12-02 NOTE — Progress Notes (Signed)
Physical Therapy Session Note  Patient Details  Name: Aaron [name] MRN: [id number] Date of Birth: October 22, 1965  Today's Date: 12/02/2018 PT Individual Time: 1145-1205 PT Individual Time Calculation (min): 20 min   Short Term Goals: Week 1:  PT Short Term Goal 1 (Week 1): Pt will perform bed mobliity with mod assist  PT Short Term Goal 2 (Week 1): Pt will propell WC with min assist x 129f.  PT Short Term Goal 3 (Week 1): Pt will perform bed<>WC transfer with max assist +2   Skilled Therapeutic Interventions/Progress Updates: Pt presented in bed stating continues to have nausea and with recent bout of vomiting.  Pt initially asking "they're going to take me back to cardiac aren't they"?  Vitals taken pulse 86, SpO2 98%, BP 105/76. Pt indicating feeling "clammy" with a cold sweat. PTA discussed with pt possible relaxation techniques (pt would mostly watch TV), however not interested at this time. Pt also refused any sitting EOB activity this session and indicating some anxiety in awaiting cardiac MD and findings. Pt's lunch came during this time however pt refusing to eat. Nsg arrived for pt's meds discussed information with them. Pt remained in bed with current needs met.      Therapy Documentation Precautions:  Precautions Precautions: Fall Precaution Comments: monitor BP, hypotensive in sitting/standing Required Braces or Orthoses: Other Brace Other Brace: RLE Bledsoe brace locked in extension when ambulating that can be d/c'd when pt can perform 10 SLRs without assist Restrictions Weight Bearing Restrictions: No RLE Weight Bearing: Weight bearing as tolerated LLE Weight Bearing: Weight bearing as tolerated General: PT Amount of Missed Time (min): 25 Minutes PT Missed Treatment Reason: Patient fatigue;Other (Comment)(N/V) Vital Signs: Therapy Vitals Pulse Rate: 83 Oxygen Therapy SpO2: 97 % Pain:   Mobility:   Locomotion :    Trunk/Postural Assessment :    Balance:    Exercises:   Other Treatments:      Therapy/Group: Individual Therapy  Gianelle Mccaul 12/02/2018, 12:41 PM

## 2018-12-02 NOTE — Progress Notes (Signed)
PROGRESS NOTE    Aaron [name]   MYT:[id number]  DOB: November 18, 1965  DOA: 11/25/2018 PCP: [facility] [place]   Brief Narrative:  Aaron [name] is an 53 y.o. male with morbid obesity; CAD (not a candidate for CABG); afib; and acute systolic CHF.  He was admitted from 3/5-4/9 for acute systolic CHF with EF 10-15% and anasarca; he developed an upper GI bleed while hospitalized requiring 1 unit PRBC.  He was discharged to CIR but developed SOB again on 4/16 and was transferred back to inpatient. He was found to have a  R-sided empyema that grew E. Coli, for which he was recommended to be treated with Ampicillin through 5/7.   He was discharged back to CIR on 4/29. [facility] is following for medical management.   Subjective: Overnight events noted, refused CPAP.  Patient states that it made him uncomfortable and anxious.  Denies dyspnea, chest pain or palpitations but is now aware of low oxygen saturations especially while asleep at night.  States that spouse has told him that he snores but no history of apneic spells.  Assessment & Plan:   Active Problems:   Debility - management per rehab staff.  Discussed with Dr. Wynn Banker on 5/6.  Right e coli empyema - cont Ampicillin through 5/7.  Anemia of chronic disease - Iron and folic acid deficiency - Status post 1 unit PRBC transfusion followed by Feraheme transfusion.  - cont oral Folic acid and Iron TID - Hemoglobin stable in the mid 7 g range.  Chronic systolic CHF with EF of 15 % -  He was becoming orthostatic and weight was dropping quickly thus, Lasix was was temporarily held for 2 days and then resumed again 5/5. -+3.8 L but not sure if intake output is accurate.  Does have some lower extremity edema but reports that is improving.  A-fib - on Coreg and Amiodarone for rate control - on Eliquis for secondary stroke prevention - RVR was noted 5/4 night and the rehab PA called and spoke with cardiology who  recommended that Coreg be increased to 3.125 BID (from daily) - Ongoing RVR in the 120s but asymptomatic. - I personally consulted cardiology for assistance given complex cardiac history including A. fib, CHF with low EF.?  I had digoxin versus increase Coreg.  Orthostatic hypotension - per the therapists, he has been orthostatic since he has been up in rehab and midodrine 5 mg 3 times daily was added 5/5.  I did not see any orthostatic vitals checked recently.  Hypoxia when falling asleep - resumed Lasix and follow- may need a sleep study- cont with the continuous pulse oximetry. -Patient refused to use CPAP last night and this quite hesitant and reluctant stating that it makes him quite uncomfortable and anxious.  Advised to see if he can try again tonight.  If not may have to just use nasal cannula oxygen and check nocturnal pulse oximetry.  CAD - medical management - not a candidate for CABG.  No chest pain reported.  Pedal edema -Was in UNNA boots which were discontinued on 5/5 in place of Ace wraps.  Mild leg edema, reportedly improving per patient.  Time spent in minutes: 35 min DVT prophylaxis: Eliquis  Antimicrobials:  Anti-infectives (From admission, onward)   Start     Dose/Rate Route Frequency Ordered Stop   11/25/18 1800  ampicillin (OMNIPEN) 2 g in sodium chloride 0.9 % 100 mL IVPB     2 g 300 mL/hr over 20  Minutes Intravenous Every 6 hours 11/25/18 1552         Objective: Vitals:   12/01/18 2001 12/02/18 0447 12/02/18 1213 12/02/18 1314  BP: 90/75 114/86  112/75  Pulse: (!) 105 (!) 130 83 83  Resp: 18 18  (!) 22  Temp: 98.5 F (36.9 C) 98.1 F (36.7 C)  98.8 F (37.1 C)  TempSrc: Oral Oral  Oral  SpO2: 99% 91% 97% 97%  Weight:  121.6 kg    Height:        Intake/Output Summary (Last 24 hours) at 12/02/2018 1404 Last data filed at 12/02/2018 1351 Gross per 24 hour  Intake 940 ml  Output 1025 ml  Net -85 ml   Filed Weights   11/30/18 0612 12/01/18 0431  12/02/18 0447  Weight: 121.6 kg 118.8 kg 121.6 kg    Examination: General exam:  Pleasant young male, moderately built and obese lying comfortably propped up in bed without distress. Respiratory system: Diminished breath sounds in the bases but otherwise clear to auscultation.  No increased work of breathing. Cardiovascular system:  S1 and S2 heard, irregular and tachycardic.  No JVD or murmurs.  1+ pitting bilateral leg edema. Gastrointestinal system: Abdomen soft, non-tender, nondistended. Normal bowel sounds   Central nervous system: Alert and oriented. No focal neurological deficits. Extremities: Currently lower extremities without Unna boots or Ace wraps.  No acute findings except edema. Skin: No rashes or ulcers Psychiatry:  Mood & affect appropriate.     Data Reviewed: I have personally reviewed following labs and imaging studies  CBC: Recent Labs  Lab 11/26/18 0512 11/30/18 0451 11/30/18 1557 12/01/18 0347  WBC 8.2 7.7 8.1 9.7  NEUTROABS 6.2  --  6.8  --   HGB 7.2* 6.9* 7.6* 7.5*  HCT 23.9* 23.5* 25.5* 24.9*  MCV 99.6 100.4* 97.7 98.8  PLT 214 224 228 246   Basic Metabolic Panel: Recent Labs  Lab 11/26/18 0512 11/30/18 0451 12/01/18 0347  NA 138 140 140  K 3.6 3.4* 5.0  CL 103 107 106  CO2 26 24 24   GLUCOSE 93 105* 108*  BUN 22* 28* 38*  CREATININE 1.00 0.99 1.14  CALCIUM 8.1* 8.2* 8.6*  MG 2.3  --  2.3   GFR: Estimated Creatinine Clearance: 100.3 mL/min (by C-G formula based on SCr of 1.14 mg/dL). Liver Function Tests: Recent Labs  Lab 11/26/18 0512  AST 20  ALT 22  ALKPHOS 82  BILITOT 0.8  PROT 7.5  ALBUMIN 1.9*    Radiology Studies: Dg Chest Port 1v Same Day  Result Date: 11/30/2018 CLINICAL DATA:  Tachycardia EXAM: PORTABLE CHEST 1 VIEW COMPARISON:  11/24/2018 FINDINGS: Small right pleural effusion. Bilateral interstitial thickening slightly worse on the right. Mild right basilar atelectasis. No pneumothorax. Stable cardiomegaly. Left-sided  PICC line with the tip projecting over the SVC. No acute osseous abnormality. IMPRESSION: Cardiomegaly with mild pulmonary vascular congestion. Stable right pleural effusion right basilar atelectasis. Electronically Signed   By: Elige KoHetal  Patel   On: 11/30/2018 21:11      Scheduled Meds: . amiodarone  200 mg Oral Daily  . apixaban  5 mg Oral BID  . carvedilol  3.125 mg Oral BID WC  . Chlorhexidine Gluconate Cloth  6 each Topical BID  . feeding supplement (ENSURE ENLIVE)  237 mL Oral TID BM  . feeding supplement (PRO-STAT SUGAR FREE 64)  30 mL Oral BID  . ferrous sulfate  325 mg Oral TID WC  . folic acid  1 mg  Oral Daily  . furosemide  40 mg Oral Daily  . magnesium oxide  400 mg Oral BID  . midodrine  5 mg Oral TID WC  . pantoprazole  40 mg Oral BID AC  . polyethylene glycol  17 g Oral BID  . rosuvastatin  20 mg Oral q1800   Continuous Infusions: . sodium chloride Stopped (11/29/18 9166)  . ampicillin (OMNIPEN) IV 2 g (12/02/18 0959)     LOS: 7 days    Marcellus Scott, MD, FACP, [facility]. [facility]  To contact the attending provider between 7A-7P or the covering provider during after hours 7P-7A, please log into the web site [email or web address] and access using universal [place] password for that web site. If you do not have the password, please call the hospital operator.

## 2018-12-02 NOTE — Progress Notes (Signed)
RT placed patient on CPAP with nasal mask.  Patient seems comfortable at this time.  RT also set up overnight pulse ox at bedside.  RT will continue to monitor.

## 2018-12-02 NOTE — Plan of Care (Signed)
  Problem: RH BOWEL ELIMINATION Goal: RH STG MANAGE BOWEL WITH ASSISTANCE Description STG Manage Bowel with Assistance. Mod  Outcome: Progressing   Problem: RH BLADDER ELIMINATION Goal: RH STG MANAGE BLADDER WITH ASSISTANCE Description STG Manage Bladder With Assistance. Min  Outcome: Progressing   Problem: RH SKIN INTEGRITY Goal: RH STG SKIN FREE OF INFECTION/BREAKDOWN Description Free of skin breakdown, infection with mod assist  Outcome: Progressing   Problem: RH PAIN MANAGEMENT Goal: RH STG PAIN MANAGED AT OR BELOW PT'S PAIN GOAL Description No pain or less than 3  Outcome: Progressing   

## 2018-12-02 NOTE — Progress Notes (Signed)
Pt is drowsy, awakens to voice. States that he is feeling weak and has episodes of hyperventilation. Pt noted with several apneic episodes, pausing breathing for about 40secs followed by bouts of hyperventilation. Pt states he feels "different" and "I don't feel great". Pt currently resting in bed. [name], PA made aware of pt's current condition. Continue plan of care.   Aaron [name] W Tylan Briguglio

## 2018-12-02 NOTE — Patient Care Conference (Signed)
Inpatient RehabilitationTeam Conference and Plan of Care Update Date: 12/02/2018   Time: 10:50 AM    Patient Name: Aaron [name]      Medical Record Number: [id number]  Date of Birth: 1966-04-12 Sex: Male         Room/Bed: 4W14C/4W14C-01 Payor Info: Payor: MEDICAID PENDING / Plan: MEDICAID PENDING / Product Type: *No Product type* /    Admitting Diagnosis: debility  Admit Date/Time:  11/25/2018  3:50 PM Admission Comments: No comment available   Primary Diagnosis:  <principal problem not specified> Principal Problem: <principal problem not specified>  Patient Active Problem List   Diagnosis Date Noted  . Chronic systolic CHF (congestive heart failure) (HCC) 11/30/2018  . Orthostasis   . Empyema (HCC)   . Elevated BUN   . PAF (paroxysmal atrial fibrillation) (HCC)   . Coronary artery disease involving native coronary artery of native heart without angina pectoris   . Acute systolic congestive heart failure (HCC)   . Pleural effusion   . Empyema of pleural space (HCC) 11/12/2018  . Debility 11/05/2018  . Pressure injury of skin 11/04/2018  . Iron deficiency anemia   . Orthostatic hypotension   . Hematoma of right thigh 10/30/2018  . Acute blood loss anemia 10/30/2018  . Tobacco abuse 10/30/2018  . Gastritis with bleeding 10/30/2018  . Esophagitis, [place] grade C 10/30/2018  . Protein-calorie malnutrition, severe 10/26/2018  . SOB (shortness of breath)   . Hypokalemia   . Atrial fibrillation with rapid ventricular response (HCC) 10/12/2018  . Morbid obesity (HCC) 10/09/2018  . Acute systolic CHF (congestive heart failure) (HCC) 10/02/2018  . Acute hypoxemic respiratory failure (HCC) 10/02/2018  . Anasarca 10/01/2018  . AKI (acute kidney injury) (HCC) 10/01/2018    Expected Discharge Date:    Team Members Present: Physician leading conference: Dr. Claudette Laws Social Worker Present: Dossie Der, LCSW Nurse Present: Otilio Carpen, LPN PT Present: Grier Rocher,  PT;Rosita Dechalus, PTA OT Present: Jackquline Denmark, OT SLP Present: Colin Benton, SLP PPS Coordinator present : Fae Pippin     Current Status/Progress Goal Weekly Team Focus  Medical   Nausea, tachycardia, fatigue,   Reduce readmission risk , improve activity tolerance  cardiology consult   Bowel/Bladder   continent of B/B LBM 05/04  remain continent offer toileting prn  assist with toileting prn laxatives prn   Swallow/Nutrition/ Hydration             ADL's   min A UB self care, max - total A for LB self care, and use of maxi move for transfers  min A overall with mod A for sit <>stand  functional transfers, self care, endurance, strengthening, balance   Mobility   min assist Rolling. Mod assist supine>sit. max assist sit>supine. WC mobility with supervision assist up to 140ft. PT able to stand on tilt table intermittient at 60 deg for short bouts   Mod assist overall at [facility] level.   improved safety with transfer, upright tolerance, endurance, LE strengthening    Communication             Safety/Cognition/ Behavioral Observations            Pain   denies any pain  free of pain  assess q shift and prn   Skin   MASD to groin/peri area (Barrier cream) Healed stage II ulcer to nare   no further breakdown or infection of skin. Improvement of current deficits  assess skin qshift and prn      *  See Care Plan and progress notes for long and short-term goals.     Barriers to Discharge  Current Status/Progress Possible Resolutions Date Resolved   Physician    IV antibiotics;Medical stability     slow progress due to medical issues  cont rehab, IM, cardiology       Nursing                  PT                    OT                  SLP                SW                Discharge Planning/Teaching Needs:  HOme with wife and family members assisting him, aware of his need for 24 hr physical care      Team Discussion:  Goals min-mod assist level. Currently total plus 2  with maximove. Bedside therapies today due high HR today. No speech needs. Tolerates 2 hrs out of bed. IV antibiotics for 10 more days. Doesn't tolerate CPAP at night now on O2. Working on endurance, strength and balance. Medical issues-cardiology seeing today.   Revisions to Treatment Plan:  Decide at team conf next week    Continued Need for Acute Rehabilitation Level of Care: The patient requires daily medical management by a physician with specialized training in physical medicine and rehabilitation for the following conditions: Daily direction of a multidisciplinary physical rehabilitation program to ensure safe treatment while eliciting the highest outcome that is of practical value to the patient.: Yes Daily medical management of patient stability for increased activity during participation in an intensive rehabilitation regime.: Yes Daily analysis of laboratory values and/or radiology reports with any subsequent need for medication adjustment of medical intervention for : Neurological problems   I attest that I was present, lead the team conference, and concur with the assessment and plan of the team. Teleconference held due to COVID-19   Lucy Chris 12/03/2018, 12:43 PM

## 2018-12-02 NOTE — Consult Note (Signed)
Cardiology Consultation:   [name] MRN: [id number]; DOB: 05-27-1966  Admit date: 11/25/2018 Date of Consult: 12/02/2018  Primary Care Provider: [facility] [place] Primary Cardiologist: Jodelle RedBridgette Christopher, MD   Patient Profile:   Aaron [name] is a 53 y.o. male with a hx of CAD, PAF, recurrent acute on chronic systolic CHF who is being seen today for the evaluation of atrial fibrillation with RVR at the request of Dr Waymon AmatoHongalgi.  History of Present Illness:   Aaron [name] is a 53 year old male with morbid obesity; CAD (not a candidate for CABG); afib; and acute systolic CHF.  He was admitted from 3/5-4/9 for acute systolic CHF with EF 10-15% and anasarca; he developed an upper GI bleed while hospitalized requiring 1 unit PRBC.  He was discharged to CIR but developed SOB again on 4/16 and was transferred back to the acute side.  He remained hospitalized through 4/29 and was discharged CIR again.  During his last hospitalization, he was found to have a R-sided empyema that grew E. Coli, for which he was recommended to be treated with Ampicillin through 5/7.   He was found to be in a-fib with RVR and we were re-consulted.  Past Medical History:  Diagnosis Date  . Acute systolic (congestive) heart failure (HCC) 10/2018  . Atrial fibrillation, new onset (HCC) 10/2018  . Coronary artery disease   . Morbid obesity (HCC)    Past Surgical History:  Procedure Laterality Date  . CHEST TUBE INSERTION Right 11/16/2018  . ESOPHAGOGASTRODUODENOSCOPY (EGD) WITH PROPOFOL N/A 10/18/2018   Procedure: ESOPHAGOGASTRODUODENOSCOPY (EGD) WITH PROPOFOL;  Surgeon: Kerin SalenKarki, Arya, MD;  [facility];  Service: Gastroenterology;  Laterality: N/A;  . IR PERC PLEURAL DRAIN W/INDWELL CATH W/IMG GUIDE  11/12/2018  . RIGHT/LEFT HEART CATH AND CORONARY ANGIOGRAPHY N/A 10/09/2018   Procedure: RIGHT/LEFT HEART CATH AND CORONARY ANGIOGRAPHY;  Surgeon: Iran OuchArida, Muhammad A, MD;   Location: [facility] INVASIVE CV LAB;  Service: Cardiovascular;  Laterality: N/A;    Inpatient Medications: Scheduled Meds: . amiodarone  200 mg Oral Daily  . apixaban  5 mg Oral BID  . carvedilol  3.125 mg Oral BID WC  . Chlorhexidine Gluconate Cloth  6 each Topical BID  . feeding supplement (ENSURE ENLIVE)  237 mL Oral TID BM  . feeding supplement (PRO-STAT SUGAR FREE 64)  30 mL Oral BID  . ferrous sulfate  325 mg Oral TID WC  . folic acid  1 mg Oral Daily  . furosemide  40 mg Oral Daily  . magnesium oxide  400 mg Oral BID  . midodrine  5 mg Oral TID WC  . pantoprazole  40 mg Oral BID AC  . polyethylene glycol  17 g Oral BID  . rosuvastatin  20 mg Oral q1800   Continuous Infusions: . sodium chloride Stopped (11/29/18 40980613)  . ampicillin (OMNIPEN) IV 2 g (12/02/18 0959)   PRN Meds: sodium chloride, acetaminophen, alum & mag hydroxide-simeth, bisacodyl, diphenhydrAMINE, guaiFENesin-dextromethorphan, metoprolol tartrate, ondansetron (ZOFRAN) IV, polyethylene glycol, prochlorperazine **OR** prochlorperazine **OR** prochlorperazine, sodium chloride flush, sodium phosphate, traZODone  Allergies:   No Known Allergies  Social History:   Social History   Socioeconomic History  . Marital status: Married    Spouse name: Not on file  . Number of children: Not on file  . Years of education: Not on file  . Highest education level: Not on file  Occupational History  . Not on file  Social Needs  . Physicist, medicalinancial resource  strain: Not on file  . Food insecurity:    Worry: Not on file    Inability: Not on file  . Transportation needs:    Medical: Not on file    Non-medical: Not on file  Tobacco Use  . Smoking status: Never Smoker  . Smokeless tobacco: Former Neurosurgeon    Types: Snuff  Substance and Sexual Activity  . Alcohol use: Never    Frequency: Never  . Drug use: Never  . Sexual activity: Not on file  Lifestyle  . Physical activity:    Days per week: Not on file    Minutes per session:  Not on file  . Stress: Not on file  Relationships  . Social connections:    Talks on phone: Not on file    Gets together: Not on file    Attends religious service: Not on file    Active member of club or organization: Not on file    Attends meetings of clubs or organizations: Not on file    Relationship status: Not on file  . Intimate partner violence:    Fear of current or ex partner: Not on file    Emotionally abused: Not on file    Physically abused: Not on file    Forced sexual activity: Not on file  Other Topics Concern  . Not on file  Social History Narrative  . Not on file    Family History:    Family History  Problem Relation Age of Onset  . Alcoholism Father   . Rheumatic fever Mother   . Diabetes Mother   . Drug abuse Brother      ROS:  Please see the history of present illness.  All other ROS reviewed and negative.     Physical Exam/Data:   Vitals:   12/01/18 1845 12/01/18 2001 12/02/18 0447 12/02/18 1213  BP:  90/75 114/86   Pulse: 97 (!) 105 (!) 130 83  Resp:  18 18   Temp:  98.5 F (36.9 C) 98.1 F (36.7 C)   TempSrc:  Oral Oral   SpO2: 95% 99% 91% 97%  Weight:   121.6 kg   Height:        Intake/Output Summary (Last 24 hours) at 12/02/2018 1243 Last data filed at 12/02/2018 1000 Gross per 24 hour  Intake 940 ml  Output 1025 ml  Net -85 ml   Last 3 Weights 12/02/2018 12/01/2018 11/30/2018  Weight (lbs) 268 lb 262 lb 268 lb  Weight (kg) 121.564 kg 118.842 kg 121.564 kg     Body mass index is 35.95 kg/m.  General:  Obese, not in no acute distress, laying flat HEENT: normal Lymph: no adenopathy Neck: no JVD Endocrine:  No thryomegaly Vascular: No carotid bruits; FA pulses 2+ bilaterally without bruits  Cardiac:  normal S1, S2; RRR; 3/6 systolic murmur  Lungs:  clear to auscultation bilaterally, no wheezing, rhonchi or rales  Abd: soft, nontender, no hepatomegaly  Ext: no edema Musculoskeletal:  No deformities, BUE and BLE strength normal and  equal Skin: warm and dry  Neuro:  CNs 2-12 intact, no focal abnormalities noted Psych:  Normal affect   EKG:  The EKG was personally reviewed and demonstrates:  SR, NSIVCD, negative T waves in the inferolateral leads Telemetry:  No telemetry  Relevant CV Studies:  Laboratory Data:  Chemistry Recent Labs  Lab 11/26/18 0512 11/30/18 0451 12/01/18 0347  NA 138 140 140  K 3.6 3.4* 5.0  CL 103 107 106  CO2 26 24 24   GLUCOSE 93 105* 108*  BUN 22* 28* 38*  CREATININE 1.00 0.99 1.14  CALCIUM 8.1* 8.2* 8.6*  GFRNONAA >60 >60 >60  GFRAA >60 >60 >60  ANIONGAP 9 9 10     Recent Labs  Lab 11/26/18 0512  PROT 7.5  ALBUMIN 1.9*  AST 20  ALT 22  ALKPHOS 82  BILITOT 0.8   Hematology Recent Labs  Lab 11/30/18 0451 11/30/18 1557 12/01/18 0347  WBC 7.7 8.1 9.7  RBC 2.34* 2.61* 2.52*  HGB 6.9* 7.6* 7.5*  HCT 23.5* 25.5* 24.9*  MCV 100.4* 97.7 98.8  MCH 29.5 29.1 29.8  MCHC 29.4* 29.8* 30.1  RDW 21.7* 22.2* 22.6*  PLT 224 228 246   Cardiac EnzymesNo results for input(s): TROPONINI in the last 168 hours. No results for input(s): TROPIPOC in the last 168 hours.  BNPNo results for input(s): BNP, PROBNP in the last 168 hours.  DDimer No results for input(s): DDIMER in the last 168 hours.  Radiology/Studies:  Dg Chest Port 1v Same Day  Result Date: 11/30/2018 CLINICAL DATA:  Tachycardia EXAM: PORTABLE CHEST 1 VIEW COMPARISON:  11/24/2018 FINDINGS: Small right pleural effusion. Bilateral interstitial thickening slightly worse on the right. Mild right basilar atelectasis. No pneumothorax. Stable cardiomegaly. Left-sided PICC line with the tip projecting over the SVC. No acute osseous abnormality. IMPRESSION: Cardiomegaly with mild pulmonary vascular congestion. Stable right pleural effusion right basilar atelectasis. Electronically Signed   By: Elige Ko   On: 11/30/2018 21:11    Assessment and Plan:   1. Atrial fibrillation - paroxysmal, the patient is back in SR, we can  increase amiodarone to 200 mg po BID - continue low dose carvedilol as he has h/o orthostasis with higher doses - on Eliquis  2. 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 Orthostatic hypotension - continue midodrine 5 mg PO TID - decrease lasix to 20 mg po daily  For questions or updates, please contact [facility] Please consult [email or web address] for contact info under   Signed, Tobias Alexander, MD  12/02/2018 12:43 PM

## 2018-12-02 NOTE — Progress Notes (Signed)
[facility] PHYSICAL MEDICINE & REHABILITATION PROGRESS NOTE   Subjective/Complaints:  Appreciate IM consult Discussed with Dr Algis Liming who is comfortable with cards follow up  ROS- Denies CP, SOB, N/V/D  Objective:   Dg Chest Port 1v Same Day  Result Date: 11/30/2018 CLINICAL DATA:  Tachycardia EXAM: PORTABLE CHEST 1 VIEW COMPARISON:  11/24/2018 FINDINGS: Small right pleural effusion. Bilateral interstitial thickening slightly worse on the right. Mild right basilar atelectasis. No pneumothorax. Stable cardiomegaly. Left-sided PICC line with the tip projecting over the SVC. No acute osseous abnormality. IMPRESSION: Cardiomegaly with mild pulmonary vascular congestion. Stable right pleural effusion right basilar atelectasis. Electronically Signed   By: Kathreen Devoid   On: 11/30/2018 21:11   Recent Labs    11/30/18 1557 12/01/18 0347  WBC 8.1 9.7  HGB 7.6* 7.5*  HCT 25.5* 24.9*  PLT 228 246   Recent Labs    11/30/18 0451 12/01/18 0347  NA 140 140  K 3.4* 5.0  CL 107 106  CO2 24 24  GLUCOSE 105* 108*  BUN 28* 38*  CREATININE 0.99 1.14  CALCIUM 8.2* 8.6*    Intake/Output Summary (Last 24 hours) at 12/02/2018 0944 Last data filed at 12/02/2018 0843 Gross per 24 hour  Intake 1060 ml  Output 875 ml  Net 185 ml     Physical Exam: Vital Signs Blood pressure 114/86, pulse (!) 130, temperature 98.1 F (36.7 C), temperature source Oral, resp. rate 18, height 6' 0.4" (1.839 m), weight 121.6 kg, SpO2 91 %. Constitutional: No distress . Vital signs reviewed. HENT: Normocephalic.  Atraumatic. Eyes: EOMI.  No discharge. Cardiovascular: No JVD. Respiratory: Normal effort. GI: Non-distended. Musc: Edema in bilateral feet Neurologic: Alert Motor: Bilateral upper extremities: 4/5 proximal distal Bilateral lower extremities:  Right lower extremity: Hip flexion 2/5, knee extension 2+/5, ankle dorsiflexion 3/5, stable Left lower extremity: Hip flexion 3-/5, knee extension 3/5, ankle  dorsiflexion 4/5, stable Skin: Lower extremities and Unna boots  Assessment/Plan: 1. Functional deficits secondary to debility secondary to CHF and empyema which require 3+ hours per day of interdisciplinary therapy in a comprehensive inpatient rehab setting.  Physiatrist is providing close team supervision and 24 hour management of active medical problems listed below.  Physiatrist and rehab team continue to assess barriers to discharge/monitor patient progress toward functional and medical goals  Care Tool:  Bathing  Bathing activity did not occur: Refused Body parts bathed by patient: Right arm, Left arm, Chest, Abdomen, Face   Body parts bathed by helper: Right lower leg, Left lower leg Body parts n/a: Front perineal area, Buttocks   Bathing assist Assist Level: Minimal Assistance - Patient > 75%     Upper Body Dressing/Undressing Upper body dressing   What is the patient wearing?: Pull over shirt    Upper body assist Assist Level: Minimal Assistance - Patient > 75%    Lower Body Dressing/Undressing Lower body dressing      What is the patient wearing?: Pants, Incontinence brief     Lower body assist Assist for lower body dressing: Total Assistance - Patient < 25%     Toileting Toileting    Toileting assist Assist for toileting: Total Assistance - Patient < 25%     Transfers Chair/bed transfer  Transfers assist  Chair/bed transfer activity did not occur: Safety/medical concerns  Chair/bed transfer assist level: 2 Helpers(slide board)     Locomotion Ambulation   Ambulation assist   Ambulation activity did not occur: Safety/medical concerns  Walk 10 feet activity   Assist  Walk 10 feet activity did not occur: Safety/medical concerns        Walk 50 feet activity   Assist Walk 50 feet with 2 turns activity did not occur: Safety/medical concerns         Walk 150 feet activity   Assist Walk 150 feet activity did not occur:  Safety/medical concerns         Walk 10 feet on uneven surface  activity   Assist Walk 10 feet on uneven surfaces activity did not occur: Safety/medical concerns         Wheelchair     Assist Will patient use wheelchair at discharge?: No   Wheelchair activity did not occur: Safety/medical concerns         Wheelchair 50 feet with 2 turns activity    Assist    Wheelchair 50 feet with 2 turns activity did not occur: Safety/medical concerns       Wheelchair 150 feet activity     Assist Wheelchair 150 feet activity did not occur: Safety/medical concerns        Medical Problem List and Plan: 1.  Deficits with mobility, transfers, endurance, weakness secondary to multifactorial debility.  Continue CIR PT, OT, Team conference today please see physician documentation under team conference tab, met with team face-to-face to discuss problems,progress, and goals. Formulized individual treatment plan based on medical history, underlying problem and comorbidities.   2.  Antithrombotics: -DVT/anticoagulation:  Pharmaceutical: Other (comment)--Eliquis. Continue to monitor hemoglobin as well as signs of bleeding.             -antiplatelet therapy: N/A 3. Pain Management: Tylenol prn 4. Mood: LCSW to follow for evaluate              -antipsychotic agents: N/A 5. Neuropsych: This patient is capable of making decisions on his own behalf. 6. Skin/Wound Care: Routine pressure relief measures.  7. Fluids/Electrolytes/Nutrition: Monitor I/O. Continue ensure bid.   Increasing BUN/creatinine ratio, encourage fluids   8. Empyema due to E coli: Continue ampicillin --Antibiotic day #17/28.  CT chest prior to d/c antibiotics? 9. ABLA due to GIB/spontaneous thigh hematoma: Continue protonix bid. Received 1 unit PRBC 4/24 and feraheme 4/26.   Now on iron supplement.  Added folic acid due to low folate levels.    Hemoglobin 7.2 on 4/30, labs ordered for tomorrow 10. ICM/Acute  systolic CHF: Monitor daily weights.  Monitor for signs/symptoms of fluid overload.  BP remains soft--no ARB. Continue coreg and Crestor.  Filed Weights   11/30/18 0612 12/01/18 0431 12/02/18 0447  Weight: 121.6 kg 118.8 kg 121.6 kg   Fluctuating ? technique 11. 3 vessel CAD: Not a surgical candidate-treated medically 12. Paroxymal A Fib with RVR: Monitor HR bid-continue amiodarone, coreg and Eliquis. Cards recommends re-eval prior to discharge.  13. Bradycardia, now tachycardia: Heart rate now tachycardic with decrease in Coreg and d/c of digoxin.  Coreg increased to twice daily, still is off digoxin.  Will ask cardiology to consult. Monitor with increased physical activity. 14. Protein calorie malnutrition: Continue to offer supplements tid. Magnesium supplemented yesterday due to low normal levels.  Added Mg supplement..  15. Orthostatic hypotension: Keep HOB > 30 degrees. Continue Unna boots for edema control and BP support.  Abdominal binder if needed  Fluid management per IM, change Unna boots weekly on THurs  LOS: 7 days A FACE TO [place] Doster 12/02/2018, 9:44 AM

## 2018-12-02 NOTE — Progress Notes (Signed)
Occupational Therapy Session Note  Patient Details  Name: Aaron [name] MRN: [id number] Date of Birth: [birth year]  Today's Date: 12/02/2018 OT Individual Time: 1350-1358 OT Individual Time Calculation (min): 8 min    Short Term Goals: Week 1:  OT Short Term Goal 1 (Week 1): Pt will complete BSC transfer with 2 assist  OT Short Term Goal 2 (Week 1): Pt will complete LB dressing sit<stand with 2 assist using LRAD to work on standing tolerance  OT Short Term Goal 3 (Week 1): Pt will complete 1/3 components of donning pants using AE as needed   Skilled Therapeutic Interventions/Progress Updates:    Upon entering the room, pt supine in bed with no c/o pain but does report nausea and pt appears diaphoretic. OT notified RN and PA of medical concerns. OT providing therapeutic use of self and recommended pursed lip breathing to utilize calming strategies secondary to anxiety over medical situation. Pt declined further OT intervention at this time. Pt in bed with call bell and all needed items within reach upon exiting the room.   Therapy Documentation Precautions:  Precautions Precautions: Fall Precaution Comments: monitor BP, hypotensive in sitting/standing Required Braces or Orthoses: Other Brace Other Brace: RLE Bledsoe brace locked in extension when ambulating that can be d/c'd when pt can perform 10 SLRs without assist Restrictions Weight Bearing Restrictions: No RLE Weight Bearing: Weight bearing as tolerated LLE Weight Bearing: Weight bearing as tolerated General: General OT Amount of Missed Time: 65 Minutes PT Missed Treatment Reason: Patient fatigue;Other (Comment)(N/V) Vital Signs: Therapy Vitals Temp: 98.8 F (37.1 C) Temp Source: Oral Pulse Rate: 83 Resp: (!) 22 BP: 112/75 Patient Position (if appropriate): Lying Oxygen Therapy SpO2: 97 % O2 Device: Nasal Cannula O2 Flow Rate (L/min): 2 L/min   Therapy/Group: Individual Therapy  Alen Bleacher 12/02/2018, 2:18 PM

## 2018-12-02 NOTE — Progress Notes (Signed)
Social Work [name], male   DOB: 1965-12-23, 53 y.o.   MRN: [id number] Spoke with wife-Pamela to update team conference and not able to set target discharge until next week, due to pt;s medical issues. She can not get him on her insurance and is working on [employer]. Wife reports they want to take him home but want him to be stable and be trained on what to do for him. Will continue to work on discharge needs.

## 2018-12-02 NOTE — Progress Notes (Signed)
Addendum  Paged by rehab PA earlier this afternoon indicating that patient was doing poorly, lethargic, not as responsive as earlier today, pallor, dyspneic.  I went to bedside and reassessed patient.  He stated that he had period of "tachypnea" and wonders if it is related to anxiety but currently resolved and denies dyspnea.  Feels abdominal is bloated but no pain.  No chest pain.  He appeared in no obvious distress.  Propped up in bed.  Alert and oriented.  Exam findings not significantly changed compared to this morning.  Personally reviewed repeat chest x-ray.  Some concern for pulmonary edema.  In addition to morning dose of oral Lasix he received, provided additional dose of IV Lasix 401.  Discussed with nursing and PA, incentive spirometer, strict intake output and monitor closely.  Encouraged patient to try and use CPAP tonight if possible.  Marcellus Scott, MD, FACP, [facility]. [facility]  To contact the attending provider between 7A-7P or the covering provider during after hours 7P-7A, please log into the web site [email or web address] and access using universal [place] password for that web site. If you do not have the password, please call the hospital operator.

## 2018-12-02 NOTE — Progress Notes (Signed)
Physical Therapy Weekly Progress Note  Patient Details  Name: Shelley Pooley MRN: [id number] Date of Birth: [birth year]  Beginning of progress report period: November 26, 2018 End of progress report period: Dec 02, 2018  Today's Date: 12/02/2018 PT Individual Time: [id number] and 1340-1350   45 min and 10 min   Patient has met 1 of 3 short term goals.  Pt has mad mild progress toward long term goald over the past week. Medical stability, and new S/S of possible CHF with increased lethargy, uncontrolled BP and irregular HR have limited greater progress at this time.   Patient continues to demonstrate the following deficits muscle weakness and muscle paralysis, abnormal tone, decreased attention, decreased awareness, decreased problem solving, decreased safety awareness, decreased memory and delayed processing, peripheral and decreased sitting balance, decreased standing balance, decreased balance strategies and difficulty maintaining precautions and therefore will continue to benefit from skilled PT intervention to increase functional independence with mobility.  Patient not progressing toward long term goals.  See goal revision..  Continue plan of care.  PT Short Term Goals Week 1:  PT Short Term Goal 1 (Week 1): Pt will perform bed mobliity with mod assist  PT Short Term Goal 1 - Progress (Week 1): Progressing toward goal PT Short Term Goal 2 (Week 1): Pt will propell WC with min assist x 180f.  PT Short Term Goal 2 - Progress (Week 1): Met PT Short Term Goal 3 (Week 1): Pt will perform bed<>WC transfer with max assist +2  PT Short Term Goal 3 - Progress (Week 1): Progressing toward goal Week 2:  PT Short Term Goal 1 (Week 2): Pt will perform bed mobility with mod assist consistently  PT Short Term Goal 2 (Week 2): Pt will tolerate 3 hour OOB in WC  PT Short Term Goal 3 (Week 2): Pt will be able to maintain standing in vital go bed > 5 minutes at > 60 deg  PT Short Term Goal 4 (Week 2): Pt will  propel WC 1521fwith min assist  Skilled Therapeutic Interventions/Progress Updates: Session 1  Pt received supine in bed and agreeable to PT. Supine>sit transfer with min assist and moderate cues for LE placement and sequecning to use rail to psuh to sitting. Once in sitting pt became extremely nauseous.and reports need for emesis. Mild emesis sitting EOB. BP assessed 95./36, extremely symptomatic, HR140-115. Sitting balance/tolerance at EOB with RN and then PA present to perform assessment and stand new IV. 15-20 minutes. Sit>supine with mod-max assist for PT to manage BLE. Rolling in bed for repositioning of chuck pad with min assist and heavy use of rails. Then [facility] with total assist + 2 due to continued nausea.  Pt vitals reassessed. BP 100/82, HR 109 in supine. SpO2 >94% throughout treatment on 2L/min supplemental )2  Session 2.  Pt received supine in bed, asleep. aroused easily, but noted to have decreased O2 to 90% on 2.5 L/min supplemental O2. Pt's breathing noted to be very labored with increased rate of 25-28 breathes per min, as well as increased palor in face. PT assisted pt to scoot up in bed with max assist in flat supine position. Noted increase of dyspnea in flat position. Pt returned to semirecubment and then OT present for treatment. PT left pt  In bed with OT.      Therapy Documentation Precautions:  Precautions Precautions: Fall Precaution Comments: monitor BP, hypotensive in sitting/standing Required Braces or Orthoses: Other Brace Other Brace: RLE Bledsoe  brace locked in extension when ambulating that can be d/c'd when pt can perform 10 SLRs without assist Restrictions Weight Bearing Restrictions: No RLE Weight Bearing: Weight bearing as tolerated LLE Weight Bearing: Weight bearing as tolerated    Vital Signs: Therapy Vitals Temp: 98.1 F (36.7 C) Temp Source: Oral Pulse Rate: (!) 130 Resp: 18 BP: 114/86 Patient Position (if appropriate):  Lying Oxygen Therapy SpO2: 91 % O2 Device: Nasal Cannula Pain: denies  Therapy/Group: Individual Therapy  Lorie Phenix 12/02/2018, 8:03 AM

## 2018-12-03 ENCOUNTER — Inpatient Hospital Stay (HOSPITAL_COMMUNITY): Payer: Self-pay | Admitting: Physical Therapy

## 2018-12-03 ENCOUNTER — Inpatient Hospital Stay (HOSPITAL_COMMUNITY): Payer: Self-pay

## 2018-12-03 ENCOUNTER — Inpatient Hospital Stay (HOSPITAL_COMMUNITY): Payer: Self-pay | Admitting: Occupational Therapy

## 2018-12-03 LAB — BASIC METABOLIC PANEL
Anion gap: 11 (ref 5–15)
BUN: 56 mg/dL — ABNORMAL HIGH (ref 6–20)
CO2: 26 mmol/L (ref 22–32)
Calcium: 8.7 mg/dL — ABNORMAL LOW (ref 8.9–10.3)
Chloride: 101 mmol/L (ref 98–111)
Creatinine, Ser: 1.25 mg/dL — ABNORMAL HIGH (ref 0.61–1.24)
GFR calc Af Amer: >60 mL/min (ref >60)
GFR calc non Af Amer: >60 mL/min (ref >60)
Glucose, Bld: 107 mg/dL — ABNORMAL HIGH (ref 70–99)
Potassium: 4.4 mmol/L (ref 3.5–5.1)
Sodium: 138 mmol/L (ref 135–145)

## 2018-12-03 LAB — CBC
HCT: 25.8 % — ABNORMAL LOW (ref 39.0–52.0)
Hemoglobin: 7.6 g/dL — ABNORMAL LOW (ref 13.0–17.0)
MCH: 29.7 pg (ref 26.0–34.0)
MCHC: 29.5 g/dL — ABNORMAL LOW (ref 30.0–36.0)
MCV: 100.8 fL — ABNORMAL HIGH (ref 80.0–100.0)
Platelets: 290 10*3/uL (ref 150–400)
RBC: 2.56 MIL/uL — ABNORMAL LOW (ref 4.22–5.81)
RDW: 21.6 % — ABNORMAL HIGH (ref 11.5–15.5)
WBC: 11.6 10*3/uL — ABNORMAL HIGH (ref 4.0–10.5)
nRBC: 1.8 % — ABNORMAL HIGH (ref 0.0–0.2)

## 2018-12-03 NOTE — Plan of Care (Signed)
  Problem: RH BOWEL ELIMINATION Goal: RH STG MANAGE BOWEL WITH ASSISTANCE Description STG Manage Bowel with Assistance. Mod  Outcome: Progressing   Problem: RH BLADDER ELIMINATION Goal: RH STG MANAGE BLADDER WITH ASSISTANCE Description STG Manage Bladder With Assistance. Min  Outcome: Progressing   Problem: RH SKIN INTEGRITY Goal: RH STG SKIN FREE OF INFECTION/BREAKDOWN Description Free of skin breakdown, infection with mod assist  Outcome: Progressing   Problem: RH PAIN MANAGEMENT Goal: RH STG PAIN MANAGED AT OR BELOW PT'S PAIN GOAL Description No pain or less than 3  Outcome: Progressing   

## 2018-12-03 NOTE — Progress Notes (Signed)
Physical Therapy Session Note  Patient Details  Name: Aaron [name] MRN: [id number] Date of Birth: 1965/09/29  Today's Date: 12/03/2018 PT Individual Time: 0902-1000 PT Individual Time Calculation (min): 58 min   Short Term Goals: Week 1:  PT Short Term Goal 1 (Week 1): Pt will perform bed mobliity with mod assist  PT Short Term Goal 1 - Progress (Week 1): Progressing toward goal PT Short Term Goal 2 (Week 1): Pt will propell WC with min assist x 173f.  PT Short Term Goal 2 - Progress (Week 1): Met PT Short Term Goal 3 (Week 1): Pt will perform bed<>WC transfer with max assist +2  PT Short Term Goal 3 - Progress (Week 1): Progressing toward goal Week 2:  PT Short Term Goal 1 (Week 2): Pt will perform bed mobility with mod assist consistently  PT Short Term Goal 2 (Week 2): Pt will tolerate 3 hour OOB in WC  PT Short Term Goal 3 (Week 2): Pt will be able to maintain standing in vital go bed > 5 minutes at > 60 deg  PT Short Term Goal 4 (Week 2): Pt will propel WC 1571fwith min assist Week 3:     Skilled Therapeutic Interventions/Progress Updates:   Pt received supine in bed and agreeable to PT. Pt noted to have ace wraps from previous day. PT removed wraps and noted to have reddened spot on the L ankle. RN made aware.  PT instructed pt in supine LE strengthening exercises: LAQ with AAROM x 10, quad sets x 12, ankle DF/PF with manual resistance x 20, clam shells with manual resistance x 12, BLE press into foot board x 12, hip abduction with AAROM x 10, bridge x 10 with PT stabilizing BLE. Block practice bed mobility for rolling R and L x 5 Bil with min assist overall from PT. Min-mod cues for improved LE set up and hip/knee flexion to improve success and reduce energy expenditure. [facility] with UE to pull on head board and mod assist from PT. Throughout treatment pt remained on 2.5L/min O2 through nasal canula; dest to 86 twice with exertion, but otherwise >93%. HR72-92 bpm. Pt left  supine in bed with call bell in reach and all needs met. No dyspnea or pallor noted on this day,       Therapy Documentation Precautions:  Precautions Precautions: Fall Precaution Comments: monitor BP, hypotensive in sitting/standing Required Braces or Orthoses: Other Brace Other Brace: RLE Bledsoe brace locked in extension when ambulating that can be d/c'd when pt can perform 10 SLRs without assist Restrictions Weight Bearing Restrictions: No RLE Weight Bearing: Weight bearing as tolerated LLE Weight Bearing: Weight bearing as tolerated Vital Signs: Therapy Vitals Pulse Rate: 73 Oxygen Therapy SpO2: 98 % O2 Device: Nasal Cannula O2 Flow Rate (L/min): 2 L/min Patient Activity (if Appropriate): In bed Pain: Pain Assessment Pain Scale: 0-10 Pain Score: 0-No pain   Therapy/Group: Individual Therapy  AuLorie Phenix/01/2019, 9:59 AM

## 2018-12-03 NOTE — Progress Notes (Signed)
PROGRESS NOTE    Aaron [name]   XFQ:[id number]  DOB: [birth year]  DOA: 11/25/2018 PCP: [facility] [place]   Brief Narrative:  Aaron [name] is an 53 y.o. male with morbid obesity; CAD (not a candidate for CABG); afib; and acute systolic CHF.  He was admitted from 3/5-4/9 for acute systolic CHF with EF 10-15% and anasarca; he developed an upper GI bleed while hospitalized requiring 1 unit PRBC.  He was discharged to CIR but developed SOB again on 4/16 and was transferred back to inpatient. He was found to have a  R-sided empyema that grew E. Coli, for which he was recommended to be treated with Ampicillin through 5/7.   He was discharged back to CIR on 4/29. [facility] is following for medical management.   Subjective: Feels better.  Denies dyspnea or "tachypnea".  No other complaints reported.  Reportedly only tried CPAP last night for 20 minutes and felt that he could take it off and hence took it off.  Assessment & Plan:   Active Problems:   Debility - management per rehab staff.  Discussed with Dr. Wynn Banker on 5/7.  Right e coli empyema - cont Ampicillin through 5/7.  Anemia of chronic disease - Iron and folic acid deficiency - Status post 1 unit PRBC transfusion followed by Feraheme transfusion.  - cont oral Folic acid and Iron TID - Hemoglobin stable in the mid 7 g range.  Acute on chronic systolic CHF with EF of 15 % -  He was becoming orthostatic and weight was dropping quickly thus, Lasix was was temporarily held for 2 days and then resumed again 5/5. -He received a dose of IV Lasix 40 mg x 1 on 5/6.  Cardiology input 5/6 appreciated.  They decrease Lasix to 20 mg daily.  No Spironolactone due to orthostasis. -Diuresed 900 mL overnight after Lasix dose.  Improved and stable. -Creatinine has gone up to 1.25, likely related to IV Lasix yesterday.  Oral Lasix has since been reduced.  Follow BMP in a.m.  A-fib - on Coreg and Amiodarone for rate  control - on Eliquis for secondary stroke prevention - Developed RVR on 5/4- 5/6.  Cardiology consulted.  Patient reverted to sinus rhythm on 5/7.  Amiodarone increased to 200 mg twice daily.  They recommended continuing low-dose carvedilol and Eliquis.  Orthostatic hypotension - per the therapists, he has been orthostatic since he has been up in rehab and midodrine 5 mg 3 times daily was added 5/5.    Hypoxia when falling asleep - resumed Lasix and follow- may need a sleep study- cont with the continuous pulse oximetry. -Patient did not adequately try CPAP last night.  Counseled him regarding importance of using CPAP if tolerated.  He will attempt to try again tonight.  CAD - medical management - not a candidate for CABG.  No chest pain reported.  Pedal edema -Was in UNNA boots which were discontinued on 5/5 in place of Ace wraps.  Mild leg edema, improving.  Now has Ace wraps.  Time spent in minutes: 35 min DVT prophylaxis: Eliquis  Antimicrobials:  Anti-infectives (From admission, onward)   Start     Dose/Rate Route Frequency Ordered Stop   11/25/18 1800  ampicillin (OMNIPEN) 2 g in sodium chloride 0.9 % 100 mL IVPB     2 g 300 mL/hr over 20 Minutes Intravenous Every 6 hours 11/25/18 1552         Objective: Vitals:   12/03/18 0357 12/03/18 0359  12/03/18 0720 12/03/18 1344  BP: 100/74 106/79  105/76  Pulse: 80 74 73 71  Resp: 17 17  16   Temp: 97.6 F (36.4 C)   (!) 97.5 F (36.4 C)  TempSrc: Oral   Oral  SpO2: 97% 95% 98% 100%  Weight:      Height:        Intake/Output Summary (Last 24 hours) at 12/03/2018 1609 Last data filed at 12/03/2018 0830 Gross per 24 hour  Intake 440 ml  Output 900 ml  Net -460 ml   Filed Weights   12/01/18 0431 12/02/18 0447 12/03/18 0300  Weight: 118.8 kg 121.6 kg 124.3 kg    Examination: General exam:  Pleasant young male, moderately built and obese lying comfortably propped up in bed without distress. Respiratory system: Diminished  breath sounds in right base.  Rest of lung fields clear to auscultation.  No increased work of breathing. Cardiovascular system:  S1 and S2 heard, irregular and tachycardic.  No JVD or murmurs.  Bilateral legs with Ace wrapping. Gastrointestinal system: Abdomen soft, non-tender, nondistended. Normal bowel sounds   Central nervous system: Alert and oriented. No focal neurological deficits. Extremities: Moving all limbs symmetrically. Skin: No rashes or ulcers Psychiatry:  Mood & affect appropriate.     Data Reviewed: I have personally reviewed following labs and imaging studies  CBC: Recent Labs  Lab 11/30/18 0451 11/30/18 1557 12/01/18 0347 12/03/18 0608  WBC 7.7 8.1 9.7 11.6*  NEUTROABS  --  6.8  --   --   HGB 6.9* 7.6* 7.5* 7.6*  HCT 23.5* 25.5* 24.9* 25.8*  MCV 100.4* 97.7 98.8 100.8*  PLT 224 228 246 290   Basic Metabolic Panel: Recent Labs  Lab 11/30/18 0451 12/01/18 0347 12/03/18 0608  NA 140 140 138  K 3.4* 5.0 4.4  CL 107 106 101  CO2 24 24 26   GLUCOSE 105* 108* 107*  BUN 28* 38* 56*  CREATININE 0.99 1.14 1.25*  CALCIUM 8.2* 8.6* 8.7*  MG  --  2.3  --    GFR: Estimated Creatinine Clearance: 92.5 mL/min (A) (by C-G formula based on SCr of 1.25 mg/dL (H)). Liver Function Tests: No results for input(s): AST, ALT, ALKPHOS, BILITOT, PROT, ALBUMIN in the last 168 hours.  Radiology Studies: Dg Chest Port 1 View  Result Date: 12/02/2018 CLINICAL DATA:  Shortness of breath. Nausea and vomiting. Recent right chest empyema EXAM: PORTABLE CHEST 1 VIEW COMPARISON:  Chest x-rays dated 11/30/2018, 11/24/2018, 11/23/2018 and chest CT dated 11/12/2018 FINDINGS: Chronic cardiomegaly. Pulmonary vascularity is normal. PICC tip is at the cavoatrial junction in good position, unchanged. There is persistent pleural thickening or loculated fluid in the right hemithorax laterally and at the base with some chronic atelectasis in the right base. There is loss of the silhouette of the  left hemidiaphragm which could be due to atelectasis or small effusion. No acute bone abnormality. IMPRESSION: 1. No change in the appearance of the right hemithorax since the prior study. 2. Loss of the silhouette of the left hemidiaphragm could be due to atelectasis or small effusion. The lordotic position could also account for the blurring of the left hemidiaphragm. Electronically Signed   By: Francene Boyers M.D.   On: 12/02/2018 15:13      Scheduled Meds:  amiodarone  200 mg Oral BID   apixaban  5 mg Oral BID   carvedilol  3.125 mg Oral BID WC   Chlorhexidine Gluconate Cloth  6 each Topical BID   feeding  supplement (ENSURE ENLIVE)  237 mL Oral TID BM   feeding supplement (PRO-STAT SUGAR FREE 64)  30 mL Oral BID   ferrous sulfate  325 mg Oral TID WC   folic acid  1 mg Oral Daily   furosemide  20 mg Oral Daily   magnesium oxide  400 mg Oral BID   midodrine  5 mg Oral TID WC   pantoprazole  40 mg Oral BID AC   polyethylene glycol  17 g Oral BID   rosuvastatin  20 mg Oral q1800   Continuous Infusions:  sodium chloride Stopped (11/29/18 0613)   ampicillin (OMNIPEN) IV 2 g (12/03/18 1535)     LOS: 8 days    Marcellus ScottAnand Mckinsey Keagle, MD, FACP, [facility]HM. [facility]  To contact the attending provider between 7A-7P or the covering provider during after hours 7P-7A, please log into the web site [email or web address] and access using universal [place] password for that web site. If you do not have the password, please call the hospital operator.

## 2018-12-03 NOTE — Progress Notes (Addendum)
Aaron [name] PHYSICAL MEDICINE & REHABILITATION PROGRESS NOTE   Subjective/Complaints: Discussed CODE STATUS with patient.  Patient desires full code at this time Appreciate IM consult Discussed with Dr Waymon Amato patient required extra IV Lasix.  Creatinine a bit higher as expected.  Appreciate cardiology consultation, they are recommending reduction of p.o. Lasix. ROS- Denies CP, SOB, N/V/D  Objective:   Dg Chest Port 1 View  Result Date: 12/02/2018 CLINICAL DATA:  Shortness of breath. Nausea and vomiting. Recent right chest empyema EXAM: PORTABLE CHEST 1 VIEW COMPARISON:  Chest x-rays dated 11/30/2018, 11/24/2018, 11/23/2018 and chest CT dated 11/12/2018 FINDINGS: Chronic cardiomegaly. Pulmonary vascularity is normal. PICC tip is at the cavoatrial junction in good position, unchanged. There is persistent pleural thickening or loculated fluid in the right hemithorax laterally and at the base with some chronic atelectasis in the right base. There is loss of the silhouette of the left hemidiaphragm which could be due to atelectasis or small effusion. No acute bone abnormality. IMPRESSION: 1. No change in the appearance of the right hemithorax since the prior study. 2. Loss of the silhouette of the left hemidiaphragm could be due to atelectasis or small effusion. The lordotic position could also account for the blurring of the left hemidiaphragm. Electronically Signed   By: Francene Boyers M.D.   On: 12/02/2018 15:13   Recent Labs    12/01/18 0347 12/03/18 0608  WBC 9.7 11.6*  HGB 7.5* 7.6*  HCT 24.9* 25.8*  PLT 246 290   Recent Labs    12/01/18 0347 12/03/18 0608  NA 140 138  K 5.0 4.4  CL 106 101  CO2 24 26  GLUCOSE 108* 107*  BUN 38* 56*  CREATININE 1.14 1.25*  CALCIUM 8.6* 8.7*    Intake/Output Summary (Last 24 hours) at 12/03/2018 1040 Last data filed at 12/03/2018 0334 Gross per 24 hour  Intake 200 ml  Output 900 ml  Net -700 ml     Physical Exam: Vital Signs Blood  pressure 106/79, pulse 73, temperature 97.6 F (36.4 C), temperature source Oral, resp. rate 17, height 6' 0.4" (1.839 m), weight 124.3 kg, SpO2 98 %. Constitutional: No distress . Vital signs reviewed. HENT: Normocephalic.  Atraumatic. Eyes: EOMI.  No discharge. Cardiovascular: No JVD. Respiratory: Normal effort. GI: Non-distended. Musc: Edema in bilateral feet Neurologic: Alert Motor: Bilateral upper extremities: 4/5 proximal distal Bilateral lower extremities:  Right lower extremity: Hip flexion 2/5, knee extension 2+/5, ankle dorsiflexion 3/5, stable Left lower extremity: Hip flexion 3-/5, knee extension 3/5, ankle dorsiflexion 4/5, stable Skin: Lower extremities and Unna boots Sensation reduced right thigh, anterior medial and lateral aspect including the right prepatellar area assessment/Plan: 1. Functional deficits secondary to debility secondary to CHF and empyema which require 3+ hours per day of interdisciplinary therapy in a comprehensive inpatient rehab setting.  Physiatrist is providing close team supervision and 24 hour management of active medical problems listed below.  Physiatrist and rehab team continue to assess barriers to discharge/monitor patient progress toward functional and medical goals  Care Tool:  Bathing  Bathing activity did not occur: Refused Body parts bathed by patient: Right arm, Left arm, Chest, Abdomen, Face   Body parts bathed by helper: Right lower leg, Left lower leg Body parts n/a: Front perineal area, Buttocks   Bathing assist Assist Level: Minimal Assistance - Patient > 75%     Upper Body Dressing/Undressing Upper body dressing   What is the patient wearing?: Pull over shirt    Upper body assist Assist  Level: Minimal Assistance - Patient > 75%    Lower Body Dressing/Undressing Lower body dressing      What is the patient wearing?: Pants, Incontinence brief     Lower body assist Assist for lower body dressing: Total Assistance -  Patient < 25%     Toileting Toileting    Toileting assist Assist for toileting: Total Assistance - Patient < 25%     Transfers Chair/bed transfer  Transfers assist  Chair/bed transfer activity did not occur: Safety/medical concerns  Chair/bed transfer assist level: 2 Helpers(slide board)     Locomotion Ambulation   Ambulation assist   Ambulation activity did not occur: Safety/medical concerns          Walk 10 feet activity   Assist  Walk 10 feet activity did not occur: Safety/medical concerns        Walk 50 feet activity   Assist Walk 50 feet with 2 turns activity did not occur: Safety/medical concerns         Walk 150 feet activity   Assist Walk 150 feet activity did not occur: Safety/medical concerns         Walk 10 feet on uneven surface  activity   Assist Walk 10 feet on uneven surfaces activity did not occur: Safety/medical concerns         Wheelchair     Assist Will patient use wheelchair at discharge?: No   Wheelchair activity did not occur: Safety/medical concerns         Wheelchair 50 feet with 2 turns activity    Assist    Wheelchair 50 feet with 2 turns activity did not occur: Safety/medical concerns       Wheelchair 150 feet activity     Assist Wheelchair 150 feet activity did not occur: Safety/medical concerns        Medical Problem List and Plan: 1.  Deficits with mobility, transfers, endurance, weakness secondary to multifactorial debility.  Continue CIR PT, OT, slow progress with reduced activity tolerance.  Blood pressures are soft intermittent tachycardia.   2.  Antithrombotics: -DVT/anticoagulation:  Pharmaceutical: Other (comment)--Eliquis. Continue to monitor hemoglobin as well as signs of bleeding.             -antiplatelet therapy: N/A 3. Pain Management: Tylenol prn 4. Mood: LCSW to follow for evaluate              -antipsychotic agents: N/A 5. Neuropsych: This patient is capable of  making decisions on his own behalf. 6. Skin/Wound Care: Routine pressure relief measures.  7. Fluids/Electrolytes/Nutrition: Monitor I/O. Continue ensure bid.   Increasing BUN/creatinine ratio, encourage fluids, appreciate IM and cardiology consultations  8. Empyema due to E coli: Continue ampicillin --Antibiotic day #18/28.  CT chest prior to d/c antibiotics? 9. ABLA due to GIB/spontaneous thigh hematoma: Continue protonix bid. Received 1 unit PRBC 4/24 and feraheme 4/26.   Now on iron supplement.  Added folic acid due to low folate levels.    Hemoglobin 7.2 on 4/30, hemoglobin 7.6 on 5/7, stable no need for transfusion 10. ICM/Acute systolic CHF: Monitor daily weights.  Monitor for signs/symptoms of fluid overload.  BP remains soft--no ARB. Continue coreg and Crestor.  Filed Weights   12/01/18 0431 12/02/18 0447 12/03/18 0300  Weight: 118.8 kg 121.6 kg 124.3 kg   Fluctuating ? technique 11. 3 vessel CAD: Not a surgical candidate-treated medically 12. Paroxymal A Fib with RVR: Monitor HR bid-continue amiodarone, coreg and Eliquis. Cards recommends re-eval prior to discharge.  13.  Bradycardia, now tachycardia: Heart rate now tachycardic with decrease in Coreg and d/c of digoxin.  Coreg increased to twice daily, still is off digoxin.  Will ask cardiology to consult. Monitor with increased physical activity. 14. Protein calorie malnutrition: Continue to offer supplements tid. Magnesium supplemented yesterday due to low normal levels.  Added Mg supplement..  15. Orthostatic hypotension: Keep HOB > 30 degrees. Continue Unna boots for edema control and BP support.  Abdominal binder if needed  Fluid management per IM/cardiology, change Unna boots weekly on THurs  LOS: 8 days A FACE TO FACE EVALUATION WAS PERFORMED  Erick Colace 12/03/2018, 10:40 AM

## 2018-12-03 NOTE — Progress Notes (Signed)
Occupational Therapy Session Note  Patient Details  Name: Unique Searfoss MRN: [id number] Date of Birth: 08-14-1965  Today's Date: 12/03/2018 OT Individual Time: 3754-2370 OT Individual Time Calculation (min): 25 min    Short Term Goals: Week 1:  OT Short Term Goal 1 (Week 1): Pt will complete BSC transfer with 2 assist  OT Short Term Goal 2 (Week 1): Pt will complete LB dressing sit<stand with 2 assist using LRAD to work on standing tolerance  OT Short Term Goal 3 (Week 1): Pt will complete 1/3 components of donning pants using AE as needed   Skilled Therapeutic Interventions/Progress Updates:    1:1. Pt received in bed agreeable to complete arm exercises. Pt completes 1# UB dowel rod circuit for UB warm up. Pt completes 4x2 min interval of beach ball volley with dowel rod for BUE strengthening and endurance required for BADLs. Vitals dipped 2x during activity to 88% but rebounded to >95% with pursed lip breathing. Exited session with pt seated in w/c, call light in reach and all needs met  Therapy Documentation Precautions:  Precautions Precautions: Fall Precaution Comments: monitor BP, hypotensive in sitting/standing Required Braces or Orthoses: Other Brace Other Brace: RLE Bledsoe brace locked in extension when ambulating that can be d/c'd when pt can perform 10 SLRs without assist Restrictions Weight Bearing Restrictions: No RLE Weight Bearing: Weight bearing as tolerated LLE Weight Bearing: Weight bearing as tolerated General:   Vital Signs:   Pain: Pain Assessment Pain Scale: 0-10 Pain Score: 0-No pain ADL:   Vision   Perception    Praxis   Exercises:   Other Treatments:     Therapy/Group: Individual Therapy  Tonny Branch 12/03/2018, 11:34 AM

## 2018-12-03 NOTE — Progress Notes (Addendum)
This nurse encouraged the pt to wear the CPAP machine while sleeping. Pt stated he tried to put it on, and was only able to tolerate it for 30 mins. Risk and benefits explained to the pt for refusing the CPAP. Pt requested to have the Nasal cannula. This nurse placed 2L of O2 [place] on the patient.

## 2018-12-03 NOTE — Progress Notes (Signed)
Occupational Therapy Weekly Progress Note  Patient Details  Name: Aaron [name] MRN: [id number] Date of Birth: 1966/04/22  Beginning of progress report period: November 26, 2018 End of progress report period: Dec 03, 2018  Today's Date: 12/03/2018 OT Individual Time: 1235-1330 and 1515-1600 OT Individual Time Calculation (min): 55 min and 45 min   Patient has met 0 of 3 short term goals. Pt has not made much progress towards occupational therapy goals this week secondary to medical concerns. Pt is very medically complex which has limited participation. However, pt does continue to be motivated for therapeutic intervention in spite of this. Pt currently requires total A for LB self care and min - mod A for UB self care. Pt is able to tolerate sitting in wheelchair for 1.5- 2 hours. Pt needing maxi move for safety with transfers at this time. Pt has been very hypotensive during therapy sessions with mobility and self care activities.   Patient continues to demonstrate the following deficits: muscle weakness, decreased cardiorespiratoy endurance and decreased oxygen support, decreased coordination and decreased motor planning, decreased awareness, decreased problem solving and decreased safety awareness and decreased sitting balance, decreased standing balance and decreased balance strategies and therefore will continue to benefit from skilled OT intervention to enhance overall performance with BADL and Reduce care partner burden.  Patient progressing toward long term goals..  Continue plan of care.  OT Short Term Goals Week 1:  OT Short Term Goal 1 (Week 1): Pt will complete BSC transfer with 2 assist  OT Short Term Goal 1 - Progress (Week 1): Not met OT Short Term Goal 2 (Week 1): Pt will complete LB dressing sit<stand with 2 assist using LRAD to work on standing tolerance  OT Short Term Goal 2 - Progress (Week 1): Not met OT Short Term Goal 3 (Week 1): Pt will complete 1/3 components of donning  pants using AE as needed  OT Short Term Goal 3 - Progress (Week 1): Not met Week 2:  OT Short Term Goal 1 (Week 2): Pt will complete 1/3 components of LB dressing using AE as needed. OT Short Term Goal 2 (Week 2): Pt will demonstrated B UE HEP independent with use of paper handout as needed. OT Short Term Goal 3 (Week 2): Pt will perform UB dressing with min A consistently in order to decrease level of assist with self care.  Skilled Therapeutic Interventions/Progress Updates:    Session 1: Upon entering the room, pt supine in bed but agreeable to OT intervention. Pt performed supine >sit with min A and increased time. Pt able to sit EOB for 6 minutes and then 9 minutes respectively. Pt's BP while seated on EOB was 106/71. Pt able to don pull over shirt with mod A this session to pull over head and down trunk. Pt remaining on EOB for grooming tasks as well with set up. RN arrived to fix catheter and pt returning to bed level at end of session. Pt was on 2.5 L via [place] and pt did desaturate to 82% but able to return to 94% with focus on pursed lip breathing. Call bell and all needed items within reach upon exiting the room.   Session 2:  Upon entering the room, pt sleeping soundly but easily aroused for OT intervention. OT began HEP for B UE strengthening exercises with use of level 2 resistive band. Pt performing 3 sets of 10 chest pulls, shoulder diagonals, bicep curls, alternating punches, and shoulder elevation. Pt needing rest breaks secondary  to fatigue. Vitals remaining at or above 90% while on 2.5 L O2 via [place] this session. OT providing min cuing for proper technique and for focus on pursed lip breathing with exercises. Pt remained in bed at end of session with call bell and all needed items within reach.   Therapy Documentation Precautions:  Precautions Precautions: Fall Precaution Comments: monitor BP, hypotensive in sitting/standing Required Braces or Orthoses: Other Brace Other Brace: RLE  Bledsoe brace locked in extension when ambulating that can be d/c'd when pt can perform 10 SLRs without assist Restrictions Weight Bearing Restrictions: No RLE Weight Bearing: Weight bearing as tolerated LLE Weight Bearing: Weight bearing as tolerated General:   Vital Signs: Therapy Vitals Temp: (!) 97.5 F (36.4 C) Temp Source: Oral Pulse Rate: 71 Resp: 16 BP: 105/76 Patient Position (if appropriate): Lying Oxygen Therapy SpO2: 100 % O2 Device: Nasal Cannula O2 Flow Rate (L/min): 2.5 L/min Pain:   ADL:   Vision   Perception    Praxis   Exercises:   Other Treatments:     Therapy/Group: Individual Therapy  Gypsy Decant 12/03/2018, 5:21 PM

## 2018-12-03 NOTE — Progress Notes (Signed)
24 hours pulse oximetry in shadow chart- patient desaturated 7.3 hours last night with evidence of severe OSA.  Per discussion with nursing and patient--he only had CPAP on for 40 minutes total. He reports "she said I could take it off and I fell asleep after I took it off."  Will try it again tonight.

## 2018-12-03 NOTE — Progress Notes (Signed)
Overnight pulse ox results printed and delivered to 4w. Paper report given to Delle Reining PA.

## 2018-12-04 ENCOUNTER — Inpatient Hospital Stay (HOSPITAL_COMMUNITY): Payer: Self-pay | Admitting: Occupational Therapy

## 2018-12-04 ENCOUNTER — Inpatient Hospital Stay (HOSPITAL_COMMUNITY): Payer: Self-pay | Admitting: Physical Therapy

## 2018-12-04 LAB — BASIC METABOLIC PANEL
Anion gap: 13 (ref 5–15)
BUN: 49 mg/dL — ABNORMAL HIGH (ref 6–20)
CO2: 29 mmol/L (ref 22–32)
Calcium: 8.6 mg/dL — ABNORMAL LOW (ref 8.9–10.3)
Chloride: 98 mmol/L (ref 98–111)
Creatinine, Ser: 0.99 mg/dL (ref 0.61–1.24)
GFR calc Af Amer: >60 mL/min (ref >60)
GFR calc non Af Amer: >60 mL/min (ref >60)
Glucose, Bld: 87 mg/dL (ref 70–99)
Potassium: 4 mmol/L (ref 3.5–5.1)
Sodium: 140 mmol/L (ref 135–145)

## 2018-12-04 NOTE — Progress Notes (Deleted)
Patient discharged to home, transportation provided by a friend.

## 2018-12-04 NOTE — Progress Notes (Signed)
PROGRESS NOTE    Aaron BaliGlenn Kitts   WUJ:[id number]  DOB: 04/17/1966  DOA: 11/25/2018 PCP: Darrin Nipperollege, [facility] [place]   Brief Narrative:  Aaron [name] is an 53 y.o. male with morbid obesity; CAD (not a candidate for CABG); afib; and acute systolic CHF.  He was admitted from 3/5-4/9 for acute systolic CHF with EF 10-15% and anasarca; he developed an upper GI bleed while hospitalized requiring 1 unit PRBC.  He was discharged to CIR but developed SOB again on 4/16 and was transferred back to inpatient. He was found to have a  R-sided empyema that grew E. Coli, for which he was recommended to be treated with Ampicillin through 5/17.   He was discharged back to CIR on 4/29. [facility] is following for medical management.  Clinically improved and stable.  [facility] signed off 5/8, discussed with Dr Wynn BankerKirsteins.  Please reconsult [facility] for any further assistance.  Subjective: Seen along with therapy at bedside this morning.  Patient denies complaints.  No dyspnea, chest pain, palpitations.  Was only able to tolerate CPAP for about 30 minutes last night.  Assessment & Plan:   Active Problems:   Debility - management per rehab staff.  Discussed with Dr. Wynn BankerKirsteins on 5/8.  Right e coli empyema -Seen by TCTS when hospitalized.  Chest tube was removed on 4/28.  Pleural fluid cultures grew E. coli.  As per Dr. Hendrickson/TCTS note from 4/28, advised 4 weeks of antibiotics. -Reviewed IV ampicillin course with pharmacist on 5/8. -IV ampicillin start date 11/15/2018.  4 weeks course to end on 12/13/2018 (not 5/7 as documented in previous progress notes).  Anemia of chronic disease - Iron and folic acid deficiency - Status post 1 unit PRBC transfusion followed by Feraheme transfusion.  - cont oral Folic acid and Iron TID - Hemoglobin stable in the mid 7 g range. -Periodically follow CBCs.  Acute on chronic systolic CHF with EF of 15 % -  He was becoming orthostatic and weight was dropping  quickly thus, Lasix was was temporarily held for 2 days and then resumed again 5/5. - Clinically improved after oral and a dose of IV Lasix. - Cardiology has seen.  Continue Lasix 20 mg daily.  No Spironolactone due to orthostasis. -Volume status much improved and apart from minimal leg edema, appears compensated.  Slightly elevated creatinine yesterday has normalized today. -Changed Ace wrap to lower extremity TED hoses which showed help with edema mobilization and orthostasis. -Recommend outpatient follow-up with [facility]/Dr. Janne Napoleonhristopher Bridget upon discharge. -Periodically follow BMP. -Patient counseled regarding fluid restriction.  A-fib - on Coreg and Amiodarone for rate control - on Eliquis for secondary stroke prevention - Developed RVR on 5/4- 5/6.  Cardiology consulted.  Patient reverted to sinus rhythm on 5/7.  Amiodarone increased to 200 mg twice daily.  They recommended continuing low-dose carvedilol and Eliquis. -Remains in sinus rhythm. -Outpatient follow-up with cardiology.  Orthostatic hypotension - per the therapists, he has been orthostatic since he has been up in rehab and midodrine 5 mg 3 times daily was added 5/5.   -Adding TED hose which should help.  Hypoxia when falling asleep -Suspicious for sleep apnea.  Patient reports history of snoring.  Nursing at CIR have noted apneic spells while asleep. -Patient unable to tolerate CPAP.  Continue oxygen via nasal cannula at 2 L/min at bedtime and while taking naps. -Recommend outpatient evaluation and management including sleep study -Prior to discharge from CIR, need to reassess for oxygen needs during  daytime by doing desaturation protocol.  CAD - medical management - not a candidate for CABG.  No chest pain reported.  Pedal edema -Status post Unna boots and Ace wrap.  Improved.  Remains on Lasix.  Adding lower extremity TED hose.  Hyperlipidemia Continue statins.  Time spent in minutes: 35 min DVT  prophylaxis: Eliquis  Antimicrobials:  Anti-infectives (From admission, onward)   Start     Dose/Rate Route Frequency Ordered Stop   11/25/18 1800  ampicillin (OMNIPEN) 2 g in sodium chloride 0.9 % 100 mL IVPB     2 g 300 mL/hr over 20 Minutes Intravenous Every 6 hours 11/25/18 1552         Objective: Vitals:   12/03/18 2246 12/04/18 0427 12/04/18 0436 12/04/18 0913  BP:  106/74  116/72  Pulse: 70 64  68  Resp: 17 16    Temp:  97.6 F (36.4 C)    TempSrc:  Oral    SpO2: 98% 96%    Weight:   125 kg   Height:        Intake/Output Summary (Last 24 hours) at 12/04/2018 1054 Last data filed at 12/04/2018 0830 Gross per 24 hour  Intake 860 ml  Output 1350 ml  Net -490 ml   Filed Weights   12/02/18 0447 12/03/18 0300 12/04/18 0436  Weight: 121.6 kg 124.3 kg 125 kg    Examination: General exam:  Pleasant young male, moderately built and obese lying comfortably propped up in bed without distress. Respiratory system: Slightly diminished breath sounds in the bases.  Rest of lung fields clear to auscultation.  No increased work of breathing. Cardiovascular system:  S1 and S2 heard, RRR.  No JVD or murmurs.  Trace bilateral leg edema.  Ace wraps off this morning. Gastrointestinal system: Abdomen soft, non-tender, nondistended. Normal bowel sounds   Central nervous system: Alert and oriented. No focal neurological deficits. Extremities: Moving all limbs symmetrically. Skin: No rashes or ulcers Psychiatry:  Mood & affect appropriate.     Data Reviewed: I have personally reviewed following labs and imaging studies  CBC: Recent Labs  Lab 11/30/18 0451 11/30/18 1557 12/01/18 0347 12/03/18 0608  WBC 7.7 8.1 9.7 11.6*  NEUTROABS  --  6.8  --   --   HGB 6.9* 7.6* 7.5* 7.6*  HCT 23.5* 25.5* 24.9* 25.8*  MCV 100.4* 97.7 98.8 100.8*  PLT 224 228 246 290   Basic Metabolic Panel: Recent Labs  Lab 11/30/18 0451 12/01/18 0347 12/03/18 0608 12/04/18 0403  NA 140 140 138 140   K 3.4* 5.0 4.4 4.0  CL 107 106 101 98  CO2 24 24 26 29   GLUCOSE 105* 108* 107* 87  BUN 28* 38* 56* 49*  CREATININE 0.99 1.14 1.25* 0.99  CALCIUM 8.2* 8.6* 8.7* 8.6*  MG  --  2.3  --   --    GFR: Estimated Creatinine Clearance: 117.2 mL/min (by C-G formula based on SCr of 0.99 mg/dL).  Radiology Studies: Dg Chest Port 1 View  Result Date: 12/02/2018 CLINICAL DATA:  Shortness of breath. Nausea and vomiting. Recent right chest empyema EXAM: PORTABLE CHEST 1 VIEW COMPARISON:  Chest x-rays dated 11/30/2018, 11/24/2018, 11/23/2018 and chest CT dated 11/12/2018 FINDINGS: Chronic cardiomegaly. Pulmonary vascularity is normal. PICC tip is at the cavoatrial junction in good position, unchanged. There is persistent pleural thickening or loculated fluid in the right hemithorax laterally and at the base with some chronic atelectasis in the right base. There is loss of the silhouette  of the left hemidiaphragm which could be due to atelectasis or small effusion. No acute bone abnormality. IMPRESSION: 1. No change in the appearance of the right hemithorax since the prior study. 2. Loss of the silhouette of the left hemidiaphragm could be due to atelectasis or small effusion. The lordotic position could also account for the blurring of the left hemidiaphragm. Electronically Signed   By: Francene Boyers M.D.   On: 12/02/2018 15:13      Scheduled Meds: . amiodarone  200 mg Oral BID  . apixaban  5 mg Oral BID  . carvedilol  3.125 mg Oral BID WC  . Chlorhexidine Gluconate Cloth  6 each Topical BID  . feeding supplement (ENSURE ENLIVE)  237 mL Oral TID BM  . feeding supplement (PRO-STAT SUGAR FREE 64)  30 mL Oral BID  . ferrous sulfate  325 mg Oral TID WC  . folic acid  1 mg Oral Daily  . furosemide  20 mg Oral Daily  . magnesium oxide  400 mg Oral BID  . midodrine  5 mg Oral TID WC  . pantoprazole  40 mg Oral BID AC  . polyethylene glycol  17 g Oral BID  . rosuvastatin  20 mg Oral q1800   Continuous  Infusions: . sodium chloride 10 mL/hr (12/03/18 2126)  . ampicillin (OMNIPEN) IV 2 g (12/04/18 0912)     LOS: 9 days    Marcellus Scott, MD, FACP, [facility]. [facility]  To contact the attending provider between 7A-7P or the covering provider during after hours 7P-7A, please log into the web site [email or web address] and access using universal [place] password for that web site. If you do not have the password, please call the hospital operator.

## 2018-12-04 NOTE — Progress Notes (Signed)
Orthopedic Tech Progress Note Patient Details:  Aaron [name] 1966/04/22 [id number]  CPM Left Knee CPM Left Knee: On Left Knee Flexion (Degrees): 90 Left Knee Extension (Degrees): 0 CPM Right Knee CPM Right Knee: On Right Knee Flexion (Degrees): 90 Right Knee Extension (Degrees): 0 Additional Comments: (tolerated without complaint of)  Post Interventions Patient Tolerated: Well Instructions Provided: Care of device, Adjustment of device  [name] 12/04/2018, 6:54 PM

## 2018-12-04 NOTE — Progress Notes (Addendum)
Progress Note  Patient Name: Aaron [name] Date of Encounter: 12/04/2018  Primary Cardiologist:  Jodelle Red, MD  Subjective   No awareness of atrial fib when he was in it. Is doing better w/ PT today  Inpatient Medications    Scheduled Meds:  amiodarone  200 mg Oral BID   apixaban  5 mg Oral BID   carvedilol  3.125 mg Oral BID WC   Chlorhexidine Gluconate Cloth  6 each Topical BID   feeding supplement (ENSURE ENLIVE)  237 mL Oral TID BM   feeding supplement (PRO-STAT SUGAR FREE 64)  30 mL Oral BID   ferrous sulfate  325 mg Oral TID WC   folic acid  1 mg Oral Daily   furosemide  20 mg Oral Daily   magnesium oxide  400 mg Oral BID   midodrine  5 mg Oral TID WC   pantoprazole  40 mg Oral BID AC   polyethylene glycol  17 g Oral BID   rosuvastatin  20 mg Oral q1800   Continuous Infusions:  sodium chloride 10 mL/hr (12/03/18 2126)   ampicillin (OMNIPEN) IV 2 g (12/04/18 0912)   PRN Meds: sodium chloride, acetaminophen, alum & mag hydroxide-simeth, bisacodyl, diphenhydrAMINE, guaiFENesin-dextromethorphan, metoprolol tartrate, ondansetron (ZOFRAN) IV, polyethylene glycol, prochlorperazine **OR** prochlorperazine **OR** prochlorperazine, sodium chloride flush, sodium phosphate, traZODone   Vital Signs    Vitals:   12/03/18 2246 12/04/18 0427 12/04/18 0436 12/04/18 0913  BP:  106/74  116/72  Pulse: 70 64  68  Resp: 17 16    Temp:  97.6 F (36.4 C)    TempSrc:  Oral    SpO2: 98% 96%    Weight:   125 kg   Height:        Intake/Output Summary (Last 24 hours) at 12/04/2018 1051 Last data filed at 12/04/2018 0830 Gross per 24 hour  Intake 860 ml  Output 1350 ml  Net -490 ml   Filed Weights   12/02/18 0447 12/03/18 0300 12/04/18 0436  Weight: 121.6 kg 124.3 kg 125 kg   Last Weight  Most recent update: 12/04/2018  4:36 AM   Weight  125 kg (275 lb 8 oz)           Weight change: 0.68 kg   Telemetry    Pulse ox at bedside, regular rhythm -  Personally Reviewed  ECG    None today - Personally Reviewed  Physical Exam   General: Well developed, morbidly obese, male appearing in no acute distress. Head: Normocephalic, atraumatic.  Neck: Supple without bruits, JVD not elevated. Lungs:  Resp regular and unlabored, few rales bases Heart: RRR, S1, S2, no S3, S4, or murmur; no rub. Abdomen: Soft, non-tender, non-distended with normoactive bowel sounds. No hepatomegaly. No rebound/guarding. No obvious abdominal masses. Extremities: No clubbing, cyanosis, trace edema. Distal pedal pulses are 2+ bilaterally. Neuro: Alert and oriented X 3. Moves all extremities spontaneously. Psych: Normal affect.  Labs    Hematology Recent Labs  Lab 11/30/18 1557 12/01/18 0347 12/03/18 0608  WBC 8.1 9.7 11.6*  RBC 2.61* 2.52* 2.56*  HGB 7.6* 7.5* 7.6*  HCT 25.5* 24.9* 25.8*  MCV 97.7 98.8 100.8*  MCH 29.1 29.8 29.7  MCHC 29.8* 30.1 29.5*  RDW 22.2* 22.6* 21.6*  PLT 228 246 290    Chemistry Recent Labs  Lab 12/01/18 0347 12/03/18 0608 12/04/18 0403  NA 140 138 140  K 5.0 4.4 4.0  CL 106 101 98  CO2 24 26 29   GLUCOSE 108*  107* 87  BUN 38* 56* 49*  CREATININE 1.14 1.25* 0.99  CALCIUM 8.6* 8.7* 8.6*  GFRNONAA >60 >60 >60  GFRAA >60 >60 >60  ANIONGAP 10 11 13      BNPNo results for input(s): BNP, PROBNP in the last 168 hours.     Radiology    Dg Chest Port 1 View  Result Date: 12/02/2018 CLINICAL DATA:  Shortness of breath. Nausea and vomiting. Recent right chest empyema EXAM: PORTABLE CHEST 1 VIEW COMPARISON:  Chest x-rays dated 11/30/2018, 11/24/2018, 11/23/2018 and chest CT dated 11/12/2018 FINDINGS: Chronic cardiomegaly. Pulmonary vascularity is normal. PICC tip is at the cavoatrial junction in good position, unchanged. There is persistent pleural thickening or loculated fluid in the right hemithorax laterally and at the base with some chronic atelectasis in the right base. There is loss of the silhouette of the left  hemidiaphragm which could be due to atelectasis or small effusion. No acute bone abnormality. IMPRESSION: 1. No change in the appearance of the right hemithorax since the prior study. 2. Loss of the silhouette of the left hemidiaphragm could be due to atelectasis or small effusion. The lordotic position could also account for the blurring of the left hemidiaphragm. Electronically Signed   By: Francene Boyers M.D.   On: 12/02/2018 15:13   Dg Chest Port 1v Same Day  Result Date: 11/30/2018 CLINICAL DATA:  Tachycardia EXAM: PORTABLE CHEST 1 VIEW COMPARISON:  11/24/2018 FINDINGS: Small right pleural effusion. Bilateral interstitial thickening slightly worse on the right. Mild right basilar atelectasis. No pneumothorax. Stable cardiomegaly. Left-sided PICC line with the tip projecting over the SVC. No acute osseous abnormality. IMPRESSION: Cardiomegaly with mild pulmonary vascular congestion. Stable right pleural effusion right basilar atelectasis. Electronically Signed   By: Elige Ko   On: 11/30/2018 21:11     Cardiac Studies   ECHO:  10/02/2018  1. The left ventricle has a visually estimated ejection fraction of. The cavity size was severely dilated. Left ventricular diastolic Doppler parameters are consistent with restrictive filling Left ventrical global hypokinesis without regional wall  motion abnormalities.  2. Diffuse hypokinesis EF 15% Given severithy of LV dysfunction consider CHF team consult.  3. The right ventricle has normal systolic function. The cavity was normal. There is no increase in right ventricular wall thickness.  4. Left atrial size was moderately dilated.  5. The mitral valve is normal in structure. Mild thickening of the mitral valve leaflet. Mitral valve regurgitation is moderate by color flow Doppler.  6. The tricuspid valve is normal in structure.  7. The aortic valve is tricuspid Mild thickening of the aortic valve Aortic valve regurgitation is mild by color flow  Doppler.  8. The pulmonic valve was grossly normal. Pulmonic valve regurgitation is mild by color flow Doppler.   CARDIAC CATH: 10/09/2018    Mid RCA lesion is 100% stenosed.  Prox RCA lesion is 70% stenosed.  There is severe left ventricular systolic dysfunction.  LV end diastolic pressure is moderately elevated.  The left ventricular ejection fraction is less than 25% by visual estimate.  Ost LM lesion is 20% stenosed.  Dist LAD lesion is 90% stenosed.  Mid LAD lesion is 80% stenosed.  Prox Cx to Mid Cx lesion is 99% stenosed.   1.  Severe three-vessel coronary artery disease with chronically occluded right coronary artery with left-to-right collaterals, subtotally occluded proximal to mid left circumflex and significant mid LAD stenosis. 2.  Severely reduced LV systolic function with an EF of 15% with global  hypokinesis. 3.  Right heart catheterization showed moderately elevated filling pressures, moderate pulmonary hypertension and mildly reduced cardiac index.  Recommendations: Discussed with Dr. Gala Romney.  Recommend starting milrinone to enhance diuresis given degree of LV systolic dysfunction.  Start heart failure medications as tolerated including Entresto and spironolactone.  I held carvedilol for now but this can likely be started in the near future.  Patient Profile     53 y.o. male w/ recent dx CAD (not CABG candidate), PAF, recurrent acute on chronic systolic CHF w/ EF 10-15%, UGIB, s/p admit 03/05-04/03/2019>>CIR>>CHF>>hosp admit w/ Right E coli empyema from 04/16-04/29/2020>>CIR, who was seen 05/07 for atrial fibrillation with RVR  Assessment & Plan    1. PAF - spontaneous conversion to SR - appears to be maintaining SR on amio 200 mg bid and low-dose BB - amio started during March hospitalization, was on 200 mg bid till 04/21, then 200 mg qd - amio increased to 200 mg bid on 05/06 - may need to change amio back to 200 mg qd in 2 weeks  2. Chronic  systolic CHF - On Lasix 20 mg qd and Coreg 3.125 mg bid - volume ok by exam, but wt is trending up, he is up 7 lbs in 5 days - discuss w/ MD if we should increase Lasix to 40 mg qd - on midodrine so no additional BP-lowering rx - continue daily wts  Otherwise, per CIR team Active Problems:   Debility   Empyema (HCC)   Elevated BUN   Orthostasis   Chronic systolic CHF (congestive heart failure) (HCC)    Signed, Theodore Demark , PA-C 10:51 AM 12/04/2018 Pager: [phone]   Attending Note:   The patient was seen and examined.  Agree with assessment and plan as noted above.  Changes made to the above note as needed.  Patient seen and independently examined with Theodore Demark, PA .   We discussed all aspects of the encounter. I agree with the assessment and plan as stated above.  1.  Acute on chronic systolic CHF:  Slowly improving  No evidence of volume overload.   He thinks he has been eating better which explains the 7 lb weight gain .  contnue current meds for now   2. PAF :  Clinically , his rhythm is regular today  - likely back in NSR   Continue current meds    I have spent a total of 40 minutes with patient reviewing hospital  notes , telemetry, EKGs, labs and examining patient as well as establishing an assessment and plan that was discussed with the patient. > 50% of time was spent in direct patient care.    Vesta Mixer, Montez Hageman., MD, [facility] 12/04/2018, 1:53 PM [address]  [address] Office [phone] Pager [phone]

## 2018-12-04 NOTE — Progress Notes (Signed)
Occupational Therapy Session Note  Patient Details  Name: Aaron [name] MRN: [id number] Date of Birth: 1966-03-10  Today's Date: 12/04/2018 OT Individual Time: 3244-0102 and [id number] OT Individual Time Calculation (min): 63 min and 59 min  Short Term Goals: Week 2:  OT Short Term Goal 1 (Week 2): Pt will complete 1/3 components of LB dressing using AE as needed. OT Short Term Goal 2 (Week 2): Pt will demonstrated B UE HEP independent with use of paper handout as needed. OT Short Term Goal 3 (Week 2): Pt will perform UB dressing with min A consistently in order to decrease level of assist with self care.  Skilled Therapeutic Interventions/Progress Updates:    Pt greeted in bed with no c/o pain and ADL needs met. Motivated to get up for tx. Supine<sit with HOB elevated completed with supervision assist. Once EOB, worked on unsupported sitting balance and UE strengthening while assembling several PVC pipe trees. Table height was elevated to upgrade demands of task. 02 sats 99-100% on 2L. He was asymptomatic throughout, and able to engage in task for >10 minutes. Pt then transitioned back to supine. Able to boost himself up when bed was placed in trendelenberg position. OT assisted with LE placement for weightbearing/strengthening. Next provided him with UE HEP for room. Placed bed in chair position and guided pt through exercises using green tband tethered to bedrails x10 reps. Pt able to exhibit carryover of exercise technique with min vcs. Encouraged him to do these exercises as much as possible outside of tx. 02 sats at this time 98-100% (using dynamap vs continuous pulse ox). Pt was left in bed with all needs within reach.   2nd Session 1:1 tx (59 min) Pt greeted in bed with no c/o pain. Supine<sit completed with supervision. Once EOB, worked on lateral leans for developing functional toileting skills. Used theraband to simulate pants. With increased time, pt able to unweight hips enough to  negotiate band halfway down thighs in the front. While lowering band in the back, he began sliding forward off of bed. 2 assist for safe transition to supine. 02 sats at this time 99%. Once he returned to EOB again, practiced slideboard transfers to bariatric drop arm BSC. +2 assist required for safety (level transfer). Pt actively involved during transfer given instruction on head/hips relationships and anterior weight shifting. Pt completed 5 push ups using toilet bars x2 sets, unable to partially clear buttocks. 02 sats 98-100% after. Level slideboard transfer<bed completed with 2 assist once again. Pt slid forward once EOB and required +2 assist for safe transition back to bed. Afterwards, pt boosted himself up in bed when it was placed in trendelenburg position. For remainder of session, worked on trunk strengthening by completing modified crunches in bed with use of B bedrails to pull up. 02 sats 95-100% on supplemental 02 after. Educated pt to complete these exercises outside of tx to build his strength up, and he verbalized understanding. At end of tx pt was left in bed with all needs within reach.      Therapy Documentation Precautions:  Precautions Precautions: Fall Precaution Comments: monitor BP, hypotensive in sitting/standing Required Braces or Orthoses: Other Brace Other Brace: RLE Bledsoe brace locked in extension when ambulating that can be d/c'd when pt can perform 10 SLRs without assist Restrictions Weight Bearing Restrictions: No RLE Weight Bearing: Weight bearing as tolerated LLE Weight Bearing: Weight bearing as tolerated Vital Signs: Therapy Vitals Temp: (!) 97.5 F (36.4 C) Temp Source: Oral Pulse Rate: 66  Resp: 18 BP: 99/67 Patient Position (if appropriate): Lying Oxygen Therapy SpO2: 100 % O2 Device: Nasal Cannula   ADL:       Therapy/Group: Individual Therapy  Macsen Nuttall A Laniah Grimm 12/04/2018, 4:19 PM

## 2018-12-04 NOTE — Progress Notes (Signed)
[facility] PHYSICAL MEDICINE & REHABILITATION PROGRESS NOTE   Subjective/Complaints: Discussed with internal medicine today.  Patient without shortness of breath, Unna boots are off.  Recommending TED hose thigh-high to reduce orthostasis. ROS- Denies CP, SOB, N/V/D  Objective:   Dg Chest Port 1 View  Result Date: 12/02/2018 CLINICAL DATA:  Shortness of breath. Nausea and vomiting. Recent right chest empyema EXAM: PORTABLE CHEST 1 VIEW COMPARISON:  Chest x-rays dated 11/30/2018, 11/24/2018, 11/23/2018 and chest CT dated 11/12/2018 FINDINGS: Chronic cardiomegaly. Pulmonary vascularity is normal. PICC tip is at the cavoatrial junction in good position, unchanged. There is persistent pleural thickening or loculated fluid in the right hemithorax laterally and at the base with some chronic atelectasis in the right base. There is loss of the silhouette of the left hemidiaphragm which could be due to atelectasis or small effusion. No acute bone abnormality. IMPRESSION: 1. No change in the appearance of the right hemithorax since the prior study. 2. Loss of the silhouette of the left hemidiaphragm could be due to atelectasis or small effusion. The lordotic position could also account for the blurring of the left hemidiaphragm. Electronically Signed   By: Francene BoyersJames  Maxwell M.D.   On: 12/02/2018 15:13   Recent Labs    12/03/18 0608  WBC 11.6*  HGB 7.6*  HCT 25.8*  PLT 290   Recent Labs    12/03/18 0608 12/04/18 0403  NA 138 140  K 4.4 4.0  CL 101 98  CO2 26 29  GLUCOSE 107* 87  BUN 56* 49*  CREATININE 1.25* 0.99  CALCIUM 8.7* 8.6*    Intake/Output Summary (Last 24 hours) at 12/04/2018 [id number] Last data filed at 12/04/2018 0830 Gross per 24 hour  Intake 860 ml  Output 1350 ml  Net -490 ml     Physical Exam: Vital Signs Blood pressure 116/72, pulse 68, temperature 97.6 F (36.4 C), temperature source Oral, resp. rate 16, height 6' 0.4" (1.839 m), weight 125 kg, SpO2 96 %. Constitutional: No  distress . Vital signs reviewed. HENT: Normocephalic.  Atraumatic. Eyes: EOMI.  No discharge. Cardiovascular: No JVD. Respiratory: Normal effort. GI: Non-distended. Musc: Edema in bilateral feet Neurologic: Alert Motor: Bilateral upper extremities: 4/5 proximal distal Bilateral lower extremities:  Right lower extremity: Hip flexion 2/5, knee extension 2+/5, ankle dorsiflexion 3/5, stable Left lower extremity: Hip flexion 3-/5, knee extension 3/5, ankle dorsiflexion 4/5, stable Skin: Lower extremities and Unna boots Sensation reduced right thigh, anterior medial and lateral aspect including the right prepatellar area assessment/Plan: 1. Functional deficits secondary to debility secondary to CHF and empyema which require 3+ hours per day of interdisciplinary therapy in a comprehensive inpatient rehab setting.  Physiatrist is providing close team supervision and 24 hour management of active medical problems listed below.  Physiatrist and rehab team continue to assess barriers to discharge/monitor patient progress toward functional and medical goals  Care Tool:  Bathing  Bathing activity did not occur: Refused Body parts bathed by patient: Right arm, Left arm, Chest, Abdomen, Face   Body parts bathed by helper: Right lower leg, Left lower leg Body parts n/a: Front perineal area, Buttocks   Bathing assist Assist Level: Minimal Assistance - Patient > 75%     Upper Body Dressing/Undressing Upper body dressing   What is the patient wearing?: Pull over shirt    Upper body assist Assist Level: Minimal Assistance - Patient > 75%    Lower Body Dressing/Undressing Lower body dressing      What is the patient wearing?:  Pants, Incontinence brief     Lower body assist Assist for lower body dressing: Total Assistance - Patient < 25%     Toileting Toileting    Toileting assist Assist for toileting: Total Assistance - Patient < 25%     Transfers Chair/bed transfer  Transfers  assist  Chair/bed transfer activity did not occur: Safety/medical concerns  Chair/bed transfer assist level: 2 Helpers(slide board)     Locomotion Ambulation   Ambulation assist   Ambulation activity did not occur: Safety/medical concerns          Walk 10 feet activity   Assist  Walk 10 feet activity did not occur: Safety/medical concerns        Walk 50 feet activity   Assist Walk 50 feet with 2 turns activity did not occur: Safety/medical concerns         Walk 150 feet activity   Assist Walk 150 feet activity did not occur: Safety/medical concerns         Walk 10 feet on uneven surface  activity   Assist Walk 10 feet on uneven surfaces activity did not occur: Safety/medical concerns         Wheelchair     Assist Will patient use wheelchair at discharge?: No   Wheelchair activity did not occur: Safety/medical concerns         Wheelchair 50 feet with 2 turns activity    Assist    Wheelchair 50 feet with 2 turns activity did not occur: Safety/medical concerns       Wheelchair 150 feet activity     Assist Wheelchair 150 feet activity did not occur: Safety/medical concerns        Medical Problem List and Plan: 1.  Deficits with mobility, transfers, endurance, weakness secondary to multifactorial debility.  Continue CIR PT, OT, slow progress with reduced activity tolerance.  Blood pressures are soft intermittent tachycardia.   2.  Antithrombotics: -DVT/anticoagulation:  Pharmaceutical: Other (comment)--Eliquis. Continue to monitor hemoglobin as well as signs of bleeding.             -antiplatelet therapy: N/A 3. Pain Management: Tylenol prn 4. Mood: LCSW to follow for evaluate              -antipsychotic agents: N/A 5. Neuropsych: This patient is capable of making decisions on his own behalf. 6. Skin/Wound Care: Routine pressure relief measures.  7. Fluids/Electrolytes/Nutrition: Monitor I/O. Continue ensure bid.    Increasing BUN/creatinine ratio, encourage fluids, appreciate IM and cardiology consultations  8. Empyema due to E coli: Continue ampicillin --Antibiotic day #19/28.  CT chest prior to d/c antibiotics? 9. ABLA due to GIB/spontaneous thigh hematoma: Continue protonix bid. Received 1 unit PRBC 4/24 and feraheme 4/26.   Now on iron supplement.  Added folic acid due to low folate levels.    Hemoglobin 7.2 on 4/30, hemoglobin 7.6 on 5/7, stable no need for transfusion 10. ICM/Acute systolic CHF: Monitor daily weights.  Monitor for signs/symptoms of fluid overload.  BP remains soft--no ARB. Continue coreg and Crestor.  Filed Weights   12/02/18 0447 12/03/18 0300 12/04/18 0436  Weight: 121.6 kg 124.3 kg 125 kg   Fluctuating ? Technique, LE no edema s/p Unna boot removal 11. 3 vessel CAD: Not a surgical candidate-treated medically 12. Paroxymal A Fib with RVR: Monitor HR bid-continue amiodarone, coreg and Eliquis. Cards recommends re-eval prior to discharge.  13. Bradycardia, now tachycardia: Heart rate now tachycardic with decrease in Coreg and d/c of digoxin.  Coreg increased to  twice daily, still is off digoxin.  Will ask cardiology to consult. Monitor with increased physical activity. 14. Protein calorie malnutrition: Continue to offer supplements tid. Magnesium supplemented yesterday due to low normal levels.  Added Mg supplement..  15. Orthostatic hypotension: Keep HOB > 30 degrees. Thigh hi TEDs.  Abdominal binder if needed  D/C Unna boots  LOS: 9 days A FACE TO FACE EVALUATION WAS PERFORMED  Erick Colace 12/04/2018, 9:38 AM

## 2018-12-04 NOTE — Progress Notes (Signed)
Physical Therapy Session Note  Patient Details  Name: Aaron [name] MRN: [id number] Date of Birth: May 02, 1966  Today's Date: 12/04/2018 PT Individual Time: 0800-0900 AND 1020-1100 PT Individual Time Calculation (min): 60 min and 40 min   Short Term Goals: Week 1:  PT Short Term Goal 1 (Week 1): Pt will perform bed mobliity with mod assist  PT Short Term Goal 1 - Progress (Week 1): Progressing toward goal PT Short Term Goal 2 (Week 1): Pt will propell WC with min assist x 131f.  PT Short Term Goal 2 - Progress (Week 1): Met PT Short Term Goal 3 (Week 1): Pt will perform bed<>WC transfer with max assist +2  PT Short Term Goal 3 - Progress (Week 1): Progressing toward goal Week 2:  PT Short Term Goal 1 (Week 2): Pt will perform bed mobility with mod assist consistently  PT Short Term Goal 2 (Week 2): Pt will tolerate 3 hour OOB in WC  PT Short Term Goal 3 (Week 2): Pt will be able to maintain standing in vital go bed > 5 minutes at > 60 deg  PT Short Term Goal 4 (Week 2): Pt will propel WC 1546fwith min assist  Skilled Therapeutic Interventions/Progress Updates:  Session 1  Pt received supine in bed and agreeable to PT. Internal medicine present for assessment and recommends thigh high ted hose for fluid management in BLE. PT donned Bil Teds with max assist to manage the RLE. Rolling R and L in bed with supervision-min assist with heavy use of rails x 4 bil for PT to don brief and pants. Supine>sit transfer with min  Assist, increased time and moderate cues for sequencing and UE placement. Sitting balance EOB x 5 min with supervision assist. PT donned socks on BLE. SB transfer with mod assist and max cues for technique.  Pt transported to rehab gym and increased in BLE therex. AAROM LAQ. Heel slides, ankle DF, hip flexion with AAROM on the R, hip abduction with manual resistance. All completed x 12 RLE and x 10 LLE. Min cues for full ROM, proper speed, and decreased compensations. Patient  [facility] to [facility] and all needs met.    Session 2.  Pt received sitting in WC and agreeable to PT. Pt transported to rehab gym. SB transfer to mat table with mod assist from PT and min cues for improved LE placement. Dynamic Sitting balance while performing deep core stabilization exercises. Chest press/overhead press with cues for diaphragmatic breathing x 15 c 2# bar weight. Trunk rotation R and L with 2# bar weight and min cues for posture, improved breathing, and increased rotation/trunk activation on the R. LE therex to perform heel slides x 10 BLE and press ups with PT to stabilize the RLE x 5 from yoga blocks. Pt returned to room and performed SB transfer to bed with mod assist for safety. Sit>supine completed with max assist to manage BLE, and left supine in bed with call bell in reach and all needs met.         Therapy Documentation Precautions:  Precautions Precautions: Fall Precaution Comments: monitor BP, hypotensive in sitting/standing Required Braces or Orthoses: Other Brace Other Brace: RLE Bledsoe brace locked in extension when ambulating that can be d/c'd when pt can perform 10 SLRs without assist Restrictions Weight Bearing Restrictions: No RLE Weight Bearing: Weight bearing as tolerated LLE Weight Bearing: Weight bearing as tolerated Vital Signs: Therapy Vitals Pulse Rate:  68 BP: 116/72 Pain: Pain Assessment Pain Scale: 0-10 Pain Score: 0-No pain session 1 and 2    Therapy/Group: Individual Therapy  Lorie Phenix 12/04/2018, 9:41 AM

## 2018-12-04 NOTE — Progress Notes (Signed)
RT offered to place pt on CPAP for the night and pt declined stating he does not need it that he is fine with just his [place] O2. RT expressed to pt that if he changes his mind or feels like he may need it tonight to have RN call RT for placement. RT will continue to monitor.

## 2018-12-05 ENCOUNTER — Inpatient Hospital Stay (HOSPITAL_COMMUNITY): Payer: Self-pay | Admitting: Physical Therapy

## 2018-12-05 NOTE — Progress Notes (Signed)
[facility] PHYSICAL MEDICINE & REHABILITATION PROGRESS NOTE   Subjective/Complaints: Pt feels well. Denies any new sob. Slept well just with oxygen  ROS: Patient denies fever, rash, sore throat, blurred vision, nausea, vomiting, diarrhea, cough, shortness of breath or chest pain, joint or back pain, headache, or mood change.   Objective:   No results found. Recent Labs    12/03/18 0608  WBC 11.6*  HGB 7.6*  HCT 25.8*  PLT 290   Recent Labs    12/03/18 0608 12/04/18 0403  NA 138 140  K 4.4 4.0  CL 101 98  CO2 26 29  GLUCOSE 107* 87  BUN 56* 49*  CREATININE 1.25* 0.99  CALCIUM 8.7* 8.6*    Intake/Output Summary (Last 24 hours) at 12/05/2018 1049 Last data filed at 12/05/2018 0900 Gross per 24 hour  Intake 540 ml  Output 200 ml  Net 340 ml     Physical Exam: Vital Signs Blood pressure 97/65, pulse 73, temperature 97.7 F (36.5 C), temperature source Oral, resp. rate 19, height 6' 0.4" (1.839 m), weight 123.6 kg, SpO2 100 %. Constitutional: No distress . Vital signs reviewed. HEENT: EOMI, oral membranes moist Neck: supple Cardiovascular: RRR without murmur. No JVD    Respiratory: CTA Bilaterally without wheezes or rales. Normal effort , O2 [place] GI: BS +, non-tender, non-distended  Musc: Edema in bilateral feet Neurologic: Alert Motor: Bilateral upper extremities: 4/5 proximal distal Bilateral lower extremities:  Right lower extremity: Hip flexion 2/5, knee extension 2+/5, ankle dorsiflexion 3/5, stable Left lower extremity: Hip flexion 3-/5, knee extension 3/5, ankle dorsiflexion 4/5, stable Skin: Lower extremities and Unna boots Sensation reduced right thigh, anterior medial and lateral aspect including the right prepatellar area--no changes  Assessment/Plan: 1. Functional deficits secondary to debility secondary to CHF and empyema which require 3+ hours per day of interdisciplinary therapy in a comprehensive inpatient rehab setting.  Physiatrist is providing  close team supervision and 24 hour management of active medical problems listed below.  Physiatrist and rehab team continue to assess barriers to discharge/monitor patient progress toward functional and medical goals  Care Tool:  Bathing  Bathing activity did not occur: Refused Body parts bathed by patient: Right arm, Left arm, Chest, Abdomen, Face   Body parts bathed by helper: Right lower leg, Left lower leg Body parts n/a: Front perineal area, Buttocks   Bathing assist Assist Level: Minimal Assistance - Patient > 75%     Upper Body Dressing/Undressing Upper body dressing   What is the patient wearing?: Pull over shirt    Upper body assist Assist Level: Minimal Assistance - Patient > 75%    Lower Body Dressing/Undressing Lower body dressing      What is the patient wearing?: Pants, Incontinence brief     Lower body assist Assist for lower body dressing: Total Assistance - Patient < 25%     Toileting Toileting    Toileting assist Assist for toileting: Total Assistance - Patient < 25%     Transfers Chair/bed transfer  Transfers assist  Chair/bed transfer activity did not occur: Safety/medical concerns  Chair/bed transfer assist level: Moderate Assistance - Patient 50 - 74%     Locomotion Ambulation   Ambulation assist   Ambulation activity did not occur: Safety/medical concerns          Walk 10 feet activity   Assist  Walk 10 feet activity did not occur: Safety/medical concerns        Walk 50 feet activity   Assist Walk  50 feet with 2 turns activity did not occur: Safety/medical concerns         Walk 150 feet activity   Assist Walk 150 feet activity did not occur: Safety/medical concerns         Walk 10 feet on uneven surface  activity   Assist Walk 10 feet on uneven surfaces activity did not occur: Safety/medical concerns         Wheelchair     Assist Will patient use wheelchair at discharge?: No   Wheelchair  activity did not occur: Safety/medical concerns         Wheelchair 50 feet with 2 turns activity    Assist    Wheelchair 50 feet with 2 turns activity did not occur: Safety/medical concerns       Wheelchair 150 feet activity     Assist Wheelchair 150 feet activity did not occur: Safety/medical concerns        Medical Problem List and Plan: 1.  Deficits with mobility, transfers, endurance, weakness secondary to multifactorial debility.  Continue CIR PT, OT, slow progress with reduced activity tolerance.  Blood pressures are soft intermittent tachycardia.    2.  Antithrombotics: -DVT/anticoagulation:  Pharmaceutical: Other (comment)--Eliquis. Continue to monitor hemoglobin as well as signs of bleeding.             -antiplatelet therapy: N/A 3. Pain Management: Tylenol prn 4. Mood: LCSW to follow for evaluate              -antipsychotic agents: N/A 5. Neuropsych: This patient is capable of making decisions on his own behalf. 6. Skin/Wound Care: Routine pressure relief measures.  7. Fluids/Electrolytes/Nutrition: Monitor I/O. Continue ensure bid.   Increasing BUN/creatinine ratio, encourage fluids, appreciate IM and cardiology consultations  8. Empyema due to E coli: Continue ampicillin --Antibiotic day #20/28.  CT chest prior to d/c antibiotics? 9. ABLA due to GIB/spontaneous thigh hematoma: Continue protonix bid. Received 1 unit PRBC 4/24 and feraheme 4/26.   Now on iron supplement.  Added folic acid due to low folate levels.    Hemoglobin 7.2 on 4/30, hemoglobin 7.6 on 5/7, stable no need for transfusion  10. ICM/Acute systolic CHF: Monitor daily weights.  Monitor for signs/symptoms of fluid overload.  BP remains soft--no ARB. Continue coreg and Crestor.  Filed Weights   12/03/18 0300 12/04/18 0436 12/05/18 0500  Weight: 124.3 kg 125 kg 123.6 kg   Fluctuating weight Technique, LE no edema s/p Unna boot removal  -continue to monitor  -oxygen at night 11. 3 vessel CAD:  Not a surgical candidate-treated medically 12. Paroxymal A Fib with RVR: Monitor HR bid-continue amiodarone, coreg and Eliquis. Cards recommends re-eval prior to discharge.  13. Bradycardia, now tachycardia: Heart rate now tachycardic with decrease in Coreg and d/c of digoxin.  Coreg increased to twice daily, still is off digoxin.  Will ask cardiology to consult. Monitor with increased physical activity. 14. Protein calorie malnutrition: Continue to offer supplements tid. Magnesium supplemented yesterday due to low normal levels.  Added Mg supplement..  15. Orthostatic hypotension: Keep HOB > 30 degrees. Thigh high TEDs.  Abdominal binder if needed    Unna boots  off   LOS: 10 days A FACE TO FACE EVALUATION WAS PERFORMED  Ranelle Oyster 12/05/2018, 10:49 AM

## 2018-12-05 NOTE — Progress Notes (Signed)
Physical Therapy Session Note  Patient Details  Name: Aaron [name] MRN: [id number] Date of Birth: [birth year]  Today's Date: 12/05/2018 PT Individual Time: 1005-1100 PT Individual Time Calculation (min): 55 min   Short Term Goals: Week 2:  PT Short Term Goal 1 (Week 2): Pt will perform bed mobility with mod assist consistently  PT Short Term Goal 2 (Week 2): Pt will tolerate 3 hour OOB in WC  PT Short Term Goal 3 (Week 2): Pt will be able to maintain standing in vital go bed > 5 minutes at > 60 deg  PT Short Term Goal 4 (Week 2): Pt will propel WC 150f with min assist    Skilled Therapeutic Interventions/Progress Updates:    Pt received supine in bed and agreeable to PT. PT donned Bil ted hose. Rolling R and L with min assist for control of the RLE for PT don pants  Supine>sit transfer with min assist and min cues for technique to come through log roll.   [facility]. Min cues from PT for WB through elbow to perform lateral weight shift in order to place the SB. [place]ym [place] x 3 min forward and reverse, level 6>7. SpO2 monitored throughout >95% on 2 L.min O2. PT attempted to have pt perform sit>stand in standing frame, unable due to width of WC. [facility]   [facility] therex AAROM LAQ 2x 8 RLE and x 12 LLE. AAROM hip flexion with manual resistance into extension 2 x 10 BLE. Cues for full ROM an decreased compensations to normaliz movement pattern.   [facility] and all needs met.          Therapy Documentation Precautions:  Precautions Precautions: Fall Precaution Comments: monitor BP, hypotensive in sitting/standing Required Braces or Orthoses: Other Brace Other Brace: RLE Bledsoe brace locked in extension when ambulating that can be d/c'd when pt can perform 10 SLRs without assist Restrictions Weight Bearing Restrictions: No RLE Weight Bearing:  Weight bearing as tolerated LLE Weight Bearing: Weight bearing as tolerated    Vital Signs: Therapy Vitals Pulse Rate: 73 BP: 97/65 Pain: denies   Therapy/Group: Individual Therapy  ALorie Phenix5/03/2019, 12:23 PM

## 2018-12-05 NOTE — Progress Notes (Signed)
Pt continues to refuse BIPAP QHS, states his doctor told him he would be okay only using O2 at night. CPAP machine removed from room at this time.

## 2018-12-06 ENCOUNTER — Inpatient Hospital Stay (HOSPITAL_COMMUNITY): Payer: Self-pay | Admitting: Occupational Therapy

## 2018-12-06 NOTE — Progress Notes (Signed)
[facility] PHYSICAL MEDICINE & REHABILITATION PROGRESS NOTE   Subjective/Complaints: Did well with just oxygen on last night. Didn't use CPAP. No new problems this morning  ROS: Patient denies fever, rash, sore throat, blurred vision, nausea, vomiting, diarrhea, cough, shortness of breath or chest pain, joint or back pain, headache, or mood change.   Objective:   No results found. No results for input(s): WBC, HGB, HCT, PLT in the last 72 hours. Recent Labs    12/04/18 0403  NA 140  K 4.0  CL 98  CO2 29  GLUCOSE 87  BUN 49*  CREATININE 0.99  CALCIUM 8.6*    Intake/Output Summary (Last 24 hours) at 12/06/2018 0925 Last data filed at 12/06/2018 0645 Gross per 24 hour  Intake 240 ml  Output 1350 ml  Net -1110 ml     Physical Exam: Vital Signs Blood pressure 99/69, pulse 73, temperature 97.8 F (36.6 C), temperature source Oral, resp. rate 16, height 6' 0.4" (1.839 m), weight 123.4 kg, SpO2 95 %. Constitutional: No distress . Vital signs reviewed. HEENT: EOMI, oral membranes moist Neck: supple Cardiovascular: RRR without murmur. No JVD    Respiratory: CTA Bilaterally without wheezes or rales. Normal effort, [place]   GI: BS +, non-tender, non-distended  Musc: Edema in bilateral feet Neurologic: Alert Motor: Bilateral upper extremities: 4/5 proximal distal Bilateral lower extremities:  Right lower extremity: Hip flexion 2/5, knee extension 2+/5, ankle dorsiflexion 3/5, stable Left lower extremity: Hip flexion 3-/5, knee extension 3/5, ankle dorsiflexion 4/5, stable Skin: Lower extremities and Unna boots Sensation reduced right thigh, anterior medial and lateral aspect including the right prepatellar area--stable Psych: pleasant  Assessment/Plan: 1. Functional deficits secondary to debility secondary to CHF and empyema which require 3+ hours per day of interdisciplinary therapy in a comprehensive inpatient rehab setting.  Physiatrist is providing close team supervision and  24 hour management of active medical problems listed below.  Physiatrist and rehab team continue to assess barriers to discharge/monitor patient progress toward functional and medical goals  Care Tool:  Bathing  Bathing activity did not occur: Refused Body parts bathed by patient: Right arm, Left arm, Chest, Abdomen, Face   Body parts bathed by helper: Right lower leg, Left lower leg Body parts n/a: Front perineal area, Buttocks   Bathing assist Assist Level: Minimal Assistance - Patient > 75%     Upper Body Dressing/Undressing Upper body dressing   What is the patient wearing?: Pull over shirt    Upper body assist Assist Level: Minimal Assistance - Patient > 75%    Lower Body Dressing/Undressing Lower body dressing      What is the patient wearing?: Pants, Incontinence brief     Lower body assist Assist for lower body dressing: Total Assistance - Patient < 25%     Toileting Toileting    Toileting assist Assist for toileting: Total Assistance - Patient < 25%     Transfers Chair/bed transfer  Transfers assist  Chair/bed transfer activity did not occur: Safety/medical concerns  Chair/bed transfer assist level: Moderate Assistance - Patient 50 - 74%     Locomotion Ambulation   Ambulation assist   Ambulation activity did not occur: Safety/medical concerns          Walk 10 feet activity   Assist  Walk 10 feet activity did not occur: Safety/medical concerns        Walk 50 feet activity   Assist Walk 50 feet with 2 turns activity did not occur: Safety/medical concerns  Walk 150 feet activity   Assist Walk 150 feet activity did not occur: Safety/medical concerns         Walk 10 feet on uneven surface  activity   Assist Walk 10 feet on uneven surfaces activity did not occur: Safety/medical concerns         Wheelchair     Assist Will patient use wheelchair at discharge?: No   Wheelchair activity did not occur:  Safety/medical concerns         Wheelchair 50 feet with 2 turns activity    Assist    Wheelchair 50 feet with 2 turns activity did not occur: Safety/medical concerns       Wheelchair 150 feet activity     Assist Wheelchair 150 feet activity did not occur: Safety/medical concerns        Medical Problem List and Plan: 1.  Deficits with mobility, transfers, endurance, weakness secondary to multifactorial debility.  Continue CIR PT, OT, slow progress with reduced activity tolerance.       2.  Antithrombotics: -DVT/anticoagulation:  Pharmaceutical: Other (comment)--Eliquis. Continue to monitor hemoglobin as well as signs of bleeding.             -antiplatelet therapy: N/A 3. Pain Management: Tylenol prn 4. Mood: LCSW to follow for evaluate              -antipsychotic agents: N/A 5. Neuropsych: This patient is capable of making decisions on his own behalf. 6. Skin/Wound Care: Routine pressure relief measures.  7. Fluids/Electrolytes/Nutrition: Monitor I/O. Continue ensure bid.   Increasing BUN/creatinine ratio, encourage fluids, appreciate IM and cardiology consultation  -some improvement from 5/7- 5/8---recheck bmet tomorrow 8. Empyema due to E coli: Continue ampicillin --Antibiotic day #21/28.  CT chest prior to d/c antibiotics? 9. ABLA due to GIB/spontaneous thigh hematoma: Continue protonix bid. Received 1 unit PRBC 4/24 and feraheme 4/26.   Now on iron supplement.  Added folic acid due to low folate levels.    Hemoglobin 7.2 on 4/30, hemoglobin 7.6 on 5/7, stable no need for transfusion  -recheck tomorrow  10. ICM/Acute systolic CHF: Monitor daily weights.  Monitor for signs/symptoms of fluid overload.  BP remains soft--no ARB. Continue coreg and Crestor.  Filed Weights   12/04/18 0436 12/05/18 0500 12/06/18 0500  Weight: 125 kg 123.6 kg 123.4 kg   Fluctuating weight Technique, LE no edema s/p Unna boot removal  -continue to monitor  -oxygen at night, no CPAP right  now 11. 3 vessel CAD: Not a surgical candidate-treated medically 12. Paroxymal A Fib with RVR: Monitor HR bid-continue amiodarone, coreg and Eliquis. Cards recommends re-eval prior to discharge.  13. Bradycardia/tachy: rate controlled at present    Coreg 3.125  twice daily, still is off digoxin. amio 200mg  bid     -cardiology has seen pt 14. Protein calorie malnutrition: Continue to offer supplements tid. Magnesium supplemented yesterday due to low normal levels.  Added Mg supplement..  15. Orthostatic hypotension: Keep HOB > 30 degrees.   -Thigh high TEDs.  Abdominal binder if needed, encourage fluids    Unna boots  off   LOS: 11 days A FACE TO FACE EVALUATION WAS PERFORMED  Ranelle Oyster 12/06/2018, 9:25 AM

## 2018-12-06 NOTE — Progress Notes (Signed)
Occupational Therapy Session Note  Patient Details  Name: Aaron [name] MRN: [id number] Date of Birth: 07/25/1966  Today's Date: 12/06/2018 OT Individual Time: 9323-5573 OT Individual Time Calculation (min): 29 min   Short Term Goals: Week 2:  OT Short Term Goal 1 (Week 2): Pt will complete 1/3 components of LB dressing using AE as needed. OT Short Term Goal 2 (Week 2): Pt will demonstrated B UE HEP independent with use of paper handout as needed. OT Short Term Goal 3 (Week 2): Pt will perform UB dressing with min A consistently in order to decrease level of assist with self care.     Skilled Therapeutic Interventions/Progress Updates:    Pt greeted in bed with no c/o pain. Requesting to sit up in his w/c. Started with donning pants bedlevel. Pt able to elevate L LE against gravity for threading pants, but this is still very difficult for R LE. For LE strengthening/weightbearing, had pt start elevating pants over hips via bridging while feet were stabilized on mattress. This became too painful due to nerve pain in R LE. He rolled Rt>Lt with Mod A for fully elevating pants. Supine<sit with HOB elevated completed with supervision. Min A for scooting Rt hip while EOB. Mod A slideboard transfer with 2nd helper present to steady equipment. While the TIS was placed in neutral, pt completed oral care and handwashing at the sink. 02 sats remained between 97-99% on RA. Afterwards, when he was reclined for pressure relief, 02 sats were still stable. Per RN, okay to keep supplemental 02 off after OT departure. Pt was very pleased about this, encouraged by overall progress this week. Pt was left in w/c with all needs within reach and safety belt fastened. Tx focus placed on functional transfers, ADL retraining, and activity tolerance.    Therapy Documentation Precautions:  Precautions Precautions: Fall Precaution Comments: monitor BP, hypotensive in sitting/standing Required Braces or Orthoses: Other  Brace Other Brace: RLE Bledsoe brace locked in extension when ambulating that can be d/c'd when pt can perform 10 SLRs without assist Restrictions Weight Bearing Restrictions: No RLE Weight Bearing: Weight bearing as tolerated LLE Weight Bearing: Weight bearing as tolerated Vital Signs: Therapy Vitals Temp: 97.8 F (36.6 C) Temp Source: Oral Pulse Rate: 77 Resp: 18 BP: 104/63 Patient Position (if appropriate): Lying Oxygen Therapy SpO2: 99 % O2 Device: Nasal Cannula O2 Flow Rate (L/min): 2 L/min ADL:       Therapy/Group: Individual Therapy  [name] A [name] 12/06/2018, 3:37 PM

## 2018-12-07 ENCOUNTER — Inpatient Hospital Stay (HOSPITAL_COMMUNITY): Payer: Self-pay | Admitting: Occupational Therapy

## 2018-12-07 ENCOUNTER — Inpatient Hospital Stay (HOSPITAL_COMMUNITY): Payer: Self-pay | Admitting: Physical Therapy

## 2018-12-07 LAB — BASIC METABOLIC PANEL
Anion gap: 8 (ref 5–15)
BUN: 27 mg/dL — ABNORMAL HIGH (ref 6–20)
CO2: 26 mmol/L (ref 22–32)
Calcium: 8.3 mg/dL — ABNORMAL LOW (ref 8.9–10.3)
Chloride: 105 mmol/L (ref 98–111)
Creatinine, Ser: 0.87 mg/dL (ref 0.61–1.24)
GFR calc Af Amer: >60 mL/min (ref >60)
GFR calc non Af Amer: >60 mL/min (ref >60)
Glucose, Bld: 92 mg/dL (ref 70–99)
Potassium: 3.7 mmol/L (ref 3.5–5.1)
Sodium: 139 mmol/L (ref 135–145)

## 2018-12-07 LAB — CBC
HCT: 27.3 % — ABNORMAL LOW (ref 39.0–52.0)
Hemoglobin: 8.2 g/dL — ABNORMAL LOW (ref 13.0–17.0)
MCH: 30.5 pg (ref 26.0–34.0)
MCHC: 30 g/dL (ref 30.0–36.0)
MCV: 101.5 fL — ABNORMAL HIGH (ref 80.0–100.0)
Platelets: 242 10*3/uL (ref 150–400)
RBC: 2.69 MIL/uL — ABNORMAL LOW (ref 4.22–5.81)
RDW: 23.1 % — ABNORMAL HIGH (ref 11.5–15.5)
WBC: 7.7 10*3/uL (ref 4.0–10.5)
nRBC: 0 % (ref 0.0–0.2)

## 2018-12-07 MED ORDER — ENSURE ENLIVE PO LIQD
237.0000 mL | Freq: Two times a day (BID) | ORAL | Status: DC
  Administered 2018-12-08 – 2018-12-14 (×13): 237 mL via ORAL

## 2018-12-07 NOTE — Progress Notes (Signed)
Nutrition Follow-up  DOCUMENTATION CODES:   Obesity unspecified  INTERVENTION:  - Decrease Enlive poto BID, each supplement provides 350 kcal and 20 grams of protein  - Continue Pro-stat 30 ml BID, each supplement provides 100 kcal and 15 grams of protein  - Add Magic Cup BID with meals due to variable PO intake at meal times, each supplement provides 290 kcal and 9 grams of protein  NUTRITION DIAGNOSIS:   Increased nutrient needs related to wound healing, chronic illness, other (therapies) as evidenced by estimated needs.  Ongoing, being addressed via oral nutrition supplements  GOAL:   Patient will meet greater than or equal to 90% of their needs  Progressing  MONITOR:   PO intake, Supplement acceptance, Weight trends, I & O's, Labs, Skin  REASON FOR ASSESSMENT:   Malnutrition Screening Tool    ASSESSMENT:   53 year old male without significant medical history who was originally admitted on 10/01/18 with 3-4 weeks history of progressive edema and SOB. Pt  was found to have fluid overload with anasarca, ascites, AKI, and abnormal LFTs. 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 Hospital course complicated by A-fib with RVR but patient developed spontaneous right thigh hematoma 3/18 as well as GIB due to esophageal ulcers with gastritis and esophagitis on 3/22. Pt also had issue with abdominal pain due to ileus treated with NGT. Pt was admitted to CIR 4/9 due to debility. On 4/16, pt reported productive cough as well as increase in SOB. CT chest ordered for work up and revealed right sided empyema with complete collapse of RLL and partial collapse of RML/RUL with reactive mediastinal lymph nodes. CT surgery and TRH were consulted for assistance and Pt was discharged to acute hospital for monitoring and treatment. Dr. Dorris Fetch recommended VIR chest tube placement. Pt underwent drainage of 800 cc thick brown purulent appearing fluid on 4/16 with placement of PleurEvac and  continued to have drainage. Chest tube removed on 4/27. Pt admitted back to CIR on 4/29.  Per MAR, pt accepting >90% of Ensure Enlive supplements and 100% of Pro-stat supplements.  Weight fairly stable over the last week. Overall, PO intake has improved. Cardiology now following pt.  Reviewed RN edema assessment. Pt with moderate pitting edema to BLE.  Meal Completion: 75-100% x last 3 days  Medications reviewed and include: Ensure Enlive TID, Pro-stat 30 ml BID, ferrous sulfate, folic acid, Lasix 20 mg daily, magnesium oxide 400 mg BID, Protonix, IV abx  Labs reviewed: hemoglobin 8.2 (L)  UOP: 750 ml x 24 hours I/O's: +1.4 L since admit  Diet Order:   Diet Order            Diet Heart Room service appropriate? Yes with Assist; Fluid consistency: Thin  Diet effective now              EDUCATION NEEDS:   Not appropriate for education at this time  Skin:  Skin Assessment: Skin Integrity Issues: Stage II: right nare, right buttocks Incisions: closed incision to chest  Last BM:  12/07/18  Height:   Ht Readings from Last 1 Encounters:  11/25/18 6' 0.4" (1.839 m)    Weight:   Wt Readings from Last 1 Encounters:  12/06/18 123.4 kg    Ideal Body Weight:  80.9 kg  BMI:  Body mass index is 36.48 kg/m.  Estimated Nutritional Needs:   Kcal:  2300-2500  Protein:  115-130 grams  Fluid:  >/= 2.0 L    Earma Reading, MS, RD, LDN  Inpatient Clinical Dietitian Pager: [phone] Weekend/After Hours: [phone]

## 2018-12-07 NOTE — Progress Notes (Signed)
Occupational Therapy Session Note  Patient Details  Name: Aaron [name] MRN: [id number] Date of Birth: 1966-07-01  Today's Date: 12/07/2018 OT Individual Time: 6387-5643 and 1435-1534 OT Individual Time Calculation (min): 71 min and 59 min  Short Term Goals: Week 2:  OT Short Term Goal 1 (Week 2): Pt will complete 1/3 components of LB dressing using AE as needed. OT Short Term Goal 2 (Week 2): Pt will demonstrated B UE HEP independent with use of paper handout as needed. OT Short Term Goal 3 (Week 2): Pt will perform UB dressing with min A consistently in order to decrease level of assist with self care.     Skilled Therapeutic Interventions/Progress Updates:    Pt greeted in bed with no c/o pain. Requesting to don pants and get out of bed. Initiated education regarding reacher use for donning pants. Supine<sit completed with supervision. While EOB, pt used reacher to thread LEs into pants with Min A and significantly increased time. Per pt, exertion of task was rated 10/10. He returned to supine to pull them up completely, requiring Max A of 1. Rolling Rt>Lt completed with Mod A. After a short rest break, pt returned EOB and completed slideboard transfer<TIS with Min A of 1, and additional helper stabilizing equipment due to inability to flatten bed. Pt able to readjust hips when reclined in TIS. Oral care and grooming tasks completed when chair was placed in neutral position with supervision/setup-Mod I. For remainder of session, focused on UB strengthening and endurance while guiding pt through B UE therapeutic exercise using 2# bar x10 reps 2 sets. 02 sats remained stable during exercises, however pt required several rest breaks and required more vcs to improve technique when fatigue set in. He was then reclined in TIS for pressure relief. Pt left with all needs within reach and safety belt fastened.   02 sats throughout tx 94-100% on RA.   2nd Session 1:1 tx (59 min) Pt greeted in bed,  reporting a lot of fatigue from earlier therapies, but agreeable to get OOB. Maxi transfer<manual w/c completed with 2 assist. Due to poor fit of w/c, provided him with a new fitted w/c and roho cushion. He then transferred to fitted w/c with new cushion (via Maxi sky). When pt self propelled w/c to dayroom, he reported greater pressure relief from buttocks and increased ease of propulsion when compared to using previous w/c. Slideboard<mat completed with Min A. Post transfer, pt reported feeling exhausted. "I'm out of gas." After a long rest break, he needed Mod A to return to w/c. 2 assist for reciprocal scooting of hips back in w/c. 2 assist for maxi transfer<bed. Pt was left in bed with all needs within reach. Tx focus placed on activity tolerance, UB strengthening, and functional transfers.      Therapy Documentation Precautions:  Precautions Precautions: Fall Precaution Comments: monitor BP, hypotensive in sitting/standing Required Braces or Orthoses: Other Brace Other Brace: RLE Bledsoe brace locked in extension when ambulating that can be d/c'd when pt can perform 10 SLRs without assist Restrictions Weight Bearing Restrictions: No RLE Weight Bearing: Weight bearing as tolerated LLE Weight Bearing: Weight bearing as tolerated Vital Signs: Therapy Vitals Temp: 97.8 F (36.6 C) Temp Source: Oral Pulse Rate: 75 Resp: 18 BP: (!) 97/56 Patient Position (if appropriate): Sitting Oxygen Therapy SpO2: 95 % O2 Device: Room Air   ADL:       Therapy/Group: Individual Therapy  Aaron [name] A Aaron [name] 12/07/2018, 4:16 PM

## 2018-12-07 NOTE — Progress Notes (Signed)
[facility] PHYSICAL MEDICINE & REHABILITATION PROGRESS NOTE   Subjective/Complaints:  The patient denies any new problems today.  He had a restful night.  He is wearing oxygen only at night.  His O2 sats are above 92 on room air while resting in bed ROS: Patient denies , nausea, vomiting, diarrhea, cough, shortness of breath or chest pain, Objective:   No results found. Recent Labs    12/07/18 0405  WBC 7.7  HGB 8.2*  HCT 27.3*  PLT 242   Recent Labs    12/07/18 0405  NA 139  K 3.7  CL 105  CO2 26  GLUCOSE 92  BUN 27*  CREATININE 0.87  CALCIUM 8.3*    Intake/Output Summary (Last 24 hours) at 12/07/2018 1548 Last data filed at 12/07/2018 1100 Gross per 24 hour  Intake 240 ml  Output 850 ml  Net -610 ml     Physical Exam: Vital Signs Blood pressure (!) 97/56, pulse 75, temperature 97.8 F (36.6 C), temperature source Oral, resp. rate 18, height 6' 0.4" (1.839 m), weight 123.4 kg, SpO2 95 %. Constitutional: No distress . Vital signs reviewed. HEENT: EOMI, oral membranes moist Neck: supple Cardiovascular: RRR without murmur. No JVD    Respiratory: CTA Bilaterally without wheezes or rales. Normal effort, [place]   GI: BS +, non-tender, non-distended  Musc: Edema in bilateral feet Neurologic: Alert Motor: Bilateral upper extremities: 4/5 proximal distal Bilateral lower extremities:  Right lower extremity: Hip flexion 2/5, knee extension 2+/5, ankle dorsiflexion 3/5, stable Left lower extremity: Hip flexion 3-/5, knee extension 3/5, ankle dorsiflexion 4/5, stable Skin: Lower extremities and Unna boots Sensation reduced right thigh, anterior medial and lateral aspect including the right prepatellar area--stable Psych: pleasant  Assessment/Plan: 1. Functional deficits secondary to debility secondary to CHF and empyema which require 3+ hours per day of interdisciplinary therapy in a comprehensive inpatient rehab setting.  Physiatrist is providing close team supervision  and 24 hour management of active medical problems listed below.  Physiatrist and rehab team continue to assess barriers to discharge/monitor patient progress toward functional and medical goals  Care Tool:  Bathing  Bathing activity did not occur: Refused Body parts bathed by patient: Right arm, Left arm, Chest, Abdomen, Face   Body parts bathed by helper: Right lower leg, Left lower leg Body parts n/a: Front perineal area, Buttocks   Bathing assist Assist Level: Minimal Assistance - Patient > 75%     Upper Body Dressing/Undressing Upper body dressing   What is the patient wearing?: Pull over shirt    Upper body assist Assist Level: Minimal Assistance - Patient > 75%    Lower Body Dressing/Undressing Lower body dressing      What is the patient wearing?: Pants     Lower body assist Assist for lower body dressing: Maximal Assistance - Patient 25 - 49%     Toileting Toileting    Toileting assist Assist for toileting: Total Assistance - Patient < 25%     Transfers Chair/bed transfer  Transfers assist  Chair/bed transfer activity did not occur: Safety/medical concerns  Chair/bed transfer assist level: Moderate Assistance - Patient 50 - 74%     Locomotion Ambulation   Ambulation assist   Ambulation activity did not occur: Safety/medical concerns          Walk 10 feet activity   Assist  Walk 10 feet activity did not occur: Safety/medical concerns        Walk 50 feet activity   Assist Walk 50  feet with 2 turns activity did not occur: Safety/medical concerns         Walk 150 feet activity   Assist Walk 150 feet activity did not occur: Safety/medical concerns         Walk 10 feet on uneven surface  activity   Assist Walk 10 feet on uneven surfaces activity did not occur: Safety/medical concerns         Wheelchair     Assist Will patient use wheelchair at discharge?: No   Wheelchair activity did not occur: Safety/medical  concerns         Wheelchair 50 feet with 2 turns activity    Assist    Wheelchair 50 feet with 2 turns activity did not occur: Safety/medical concerns       Wheelchair 150 feet activity     Assist Wheelchair 150 feet activity did not occur: Safety/medical concerns        Medical Problem List and Plan: 1.  Deficits with mobility, transfers, endurance, weakness secondary to multifactorial debility.  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       2.  Antithrombotics: -DVT/anticoagulation:  Pharmaceutical: Other (comment)--Eliquis. Continue to monitor hemoglobin as well as signs of bleeding.             -antiplatelet therapy: N/A 3. Pain Management: Tylenol prn 4. Mood: LCSW to follow for evaluate              -antipsychotic agents: N/A 5. Neuropsych: This patient is capable of making decisions on his own behalf. 6. Skin/Wound Care: Routine pressure relief measures.  7. Fluids/Electrolytes/Nutrition: Monitor I/O. Continue ensure bid.   Increasing BUN/creatinine ratio, encourage fluids, appreciate IM and cardiology consultation  -some improvement from 5/7- 5/8---recheck bmet tomorrow 8. Empyema due to E coli: Continue ampicillin --Antibiotic day #22/28.  CT chest prior to d/c antibiotics? 9. ABLA due to GIB/spontaneous thigh hematoma: Continue protonix bid. Received 1 unit PRBC 4/24 and feraheme 4/26.   Now on iron supplement.  Added folic acid due to low folate levels.    Hemoglobin 7.2 on 4/30, hemoglobin 7.6 on 5/7, now up to 8.2 on 5/11   10. ICM/Acute systolic CHF: Monitor daily weights.  Monitor for signs/symptoms of fluid overload.  BP remains soft--no ARB. Continue coreg and Crestor.  Filed Weights   12/04/18 0436 12/05/18 0500 12/06/18 0500  Weight: 125 kg 123.6 kg 123.4 kg   Fluctuating weight Technique, LE no edema s/p Unna boot removal  -continue to monitor  -oxygen  at night, no CPAP right now 11. 3 vessel CAD: Not a surgical candidate-treated medically 12. Paroxymal A Fib with RVR: Monitor HR bid-continue amiodarone, coreg and Eliquis. Cards recommends re-eval prior to discharge.  13. Bradycardia/tachy: rate controlled at present    Coreg 3.125  twice daily, still is off digoxin. amio 200mg  bid     -cardiology has seen pt 14. Protein calorie malnutrition: Continue to offer supplements tid. Magnesium supplemented yesterday due to low normal levels.  Added Mg supplement..  15. Orthostatic hypotension: Keep HOB > 30 degrees.   -Thigh high TEDs.  Abdominal binder if needed, encourage fluids    Unna boots  off   LOS: 12 days A FACE TO FACE EVALUATION WAS PERFORMED  Erick Colace 12/07/2018, 3:48 PM

## 2018-12-07 NOTE — Progress Notes (Signed)
Physical Therapy Session Note  Patient Details  Name: Aaron [name] MRN: [id number] Date of Birth: January 19, 1966  Today's Date: 12/07/2018 PT Individual Time: 1230-1330 PT Individual Time Calculation (min): 60 min   Short Term Goals: Week 2:  PT Short Term Goal 1 (Week 2): Pt will perform bed mobility with mod assist consistently  PT Short Term Goal 2 (Week 2): Pt will tolerate 3 hour OOB in WC  PT Short Term Goal 3 (Week 2): Pt will be able to maintain standing in vital go bed > 5 minutes at > 60 deg  PT Short Term Goal 4 (Week 2): Pt will propel WC 174f with min assist  Skilled Therapeutic Interventions/Progress Updates: Pt presented in bed agreeable to therapy. Performed supine to sit with use of bed features with minA and increased time. Performed SB transfer to [place] with minA x 2 and cues for increased forward flexion and improving head/hips relationship. Once pt in chair and tilted pt was able to complete scooting to reposition appropriately in chair. Pt then transported to rehab gym and performed SB transfer to R onto mat with modA x 2. Session then focused on activity tolerance and endurance. Pt passed/tossed beach ball x10 with min challenges and kicked same ball with LLE x 10. Pt then performed AALAQ and AA hip flexion with RLE x 10 ea. PTA donned Bledsoe brace and performed x 2 STS "three musketeers" style with pt able to clear buttocks off mat however unable to come to complete stand. Pt performed SB transfer back to TIS chair modA x 2 due to increased fatigue. Pt returned to room and agreeable to remain in chair. Pt left with belt alarm on, call bell within reach and needs met.      Therapy Documentation Precautions:  Precautions Precautions: Fall Precaution Comments: monitor BP, hypotensive in sitting/standing Required Braces or Orthoses: Other Brace Other Brace: RLE Bledsoe brace locked in extension when ambulating that can be d/c'd when pt can perform 10 SLRs without  assist Restrictions Weight Bearing Restrictions: No RLE Weight Bearing: Weight bearing as tolerated LLE Weight Bearing: Weight bearing as tolerated General:   Vital Signs: Therapy Vitals Temp: 97.8 F (36.6 C) Temp Source: Oral Pulse Rate: 75 Resp: 18 BP: (!) 97/56 Patient Position (if appropriate): Sitting Oxygen Therapy SpO2: 95 % O2 Device: Room Air    Therapy/Group: Individual Therapy  Johnanthony Wilden  Alda Gaultney, PTA  12/07/2018, 5:04 PM

## 2018-12-08 ENCOUNTER — Inpatient Hospital Stay (HOSPITAL_COMMUNITY): Payer: Self-pay | Admitting: Occupational Therapy

## 2018-12-08 ENCOUNTER — Inpatient Hospital Stay (HOSPITAL_COMMUNITY): Payer: Self-pay | Admitting: Physical Therapy

## 2018-12-08 NOTE — Progress Notes (Signed)
Occupational Therapy Session Note  Patient Details  Name: Aaron [name] MRN: [id number] Date of Birth: 11/27/1965  Today's Date: 12/08/2018 OT Individual Time: 0051-1021 OT Individual Time Calculation (min): 72 min    Short Term Goals: Week 2:  OT Short Term Goal 1 (Week 2): Pt will complete 1/3 components of LB dressing using AE as needed. OT Short Term Goal 2 (Week 2): Pt will demonstrated B UE HEP independent with use of paper handout as needed. OT Short Term Goal 3 (Week 2): Pt will perform UB dressing with min A consistently in order to decrease level of assist with self care.  Skilled Therapeutic Interventions/Progress Updates:    Upon entering the room, pt supine in bed with no c/o pain and agreeable to OT intervention. OT provided total A to don B TED hose and thread pants onto feet. Pt rolling L <> R with max A to pull pants over B hips. Supine > sit with min A to EOB. Pt verbalizing proper place to place board and able to lean laterally with min guard. OT placing board and set up wheelchair. Pt performed slide board to the L with min A and steadying of equipment for safety. Pt taking rest break secondary to fatigue but vitals remained WFLs. OT assisted pt via wheelchair to sink for grooming tasks with set up A. Pt donning pull over shirt with min A secondary IV. Pt's blood pressure while seated in wheelchair was 108/73 and pt remaining in chair with call bell and all needed items within reach upon exiting the room.   Therapy Documentation Precautions:  Precautions Precautions: Fall Precaution Comments: monitor BP, hypotensive in sitting/standing Required Braces or Orthoses: Other Brace Other Brace: RLE Bledsoe brace locked in extension when ambulating that can be d/c'd when pt can perform 10 SLRs without assist Restrictions Weight Bearing Restrictions: No RLE Weight Bearing: Weight bearing as tolerated LLE Weight Bearing: Weight bearing as tolerated Pain: Pain Assessment Pain  Scale: 0-10 Pain Score: 0-No pain    Therapy/Group: Individual Therapy  Alen Bleacher 12/08/2018, 12:18 PM

## 2018-12-08 NOTE — Progress Notes (Signed)
PHYSICAL MEDICINE & REHABILITATION PROGRESS NOTE   Subjective/Complaints:  Patient states that O2 sat monitor varies considerably.  He needs to unplug it and replug it to get a reading.  Current heart monitor reading is 130-160 however apical pulse auscultation is 80 bpm ROS: Patient denies , nausea, vomiting, diarrhea, cough, shortness of breath or chest pain, Objective:   No results found. Recent Labs    12/07/18 0405  WBC 7.7  HGB 8.2*  HCT 27.3*  PLT 242   Recent Labs    12/07/18 0405  NA 139  K 3.7  CL 105  CO2 26  GLUCOSE 92  BUN 27*  CREATININE 0.87  CALCIUM 8.3*    Intake/Output Summary (Last 24 hours) at 12/08/2018 0844 Last data filed at 12/08/2018 0427 Gross per 24 hour  Intake 130 ml  Output 1151 ml  Net -1021 ml     Physical Exam: Vital Signs Blood pressure 105/72, pulse 74, temperature 98.1 F (36.7 C), temperature source Oral, resp. rate 16, height 6' 0.4" (1.839 m), weight 122.5 kg, SpO2 93 %. Constitutional: No distress . Vital signs reviewed. HEENT: EOMI, oral membranes moist Neck: supple Cardiovascular: RRR without murmur. No JVD    Respiratory: CTA Bilaterally without wheezes or rales. Normal effort, [place]   GI: BS +, non-tender, non-distended  Musc: Edema in bilateral feet Neurologic: Alert Motor: Bilateral upper extremities: 4/5 proximal distal Bilateral lower extremities:  Right lower extremity: Hip flexion 2/5, knee extension 2+/5, ankle dorsiflexion 3/5, stable Left lower extremity: Hip flexion 3-/5, knee extension 3/5, ankle dorsiflexion 4/5, stable Skin: Lower extremities and Unna boots Sensation reduced right thigh, anterior medial and lateral aspect including the right prepatellar area--stable Psych: pleasant  Assessment/Plan: 1. Functional deficits secondary to debility secondary to CHF and empyema which require 3+ hours per day of interdisciplinary therapy in a comprehensive inpatient rehab setting.  Physiatrist is  providing close team supervision and 24 hour management of active medical problems listed below.  Physiatrist and rehab team continue to assess barriers to discharge/monitor patient progress toward functional and medical goals  Care Tool:  Bathing  Bathing activity did not occur: Refused Body parts bathed by patient: Right arm, Left arm, Chest, Abdomen, Face   Body parts bathed by helper: Right lower leg, Left lower leg Body parts n/a: Front perineal area, Buttocks   Bathing assist Assist Level: Minimal Assistance - Patient > 75%     Upper Body Dressing/Undressing Upper body dressing   What is the patient wearing?: Pull over shirt    Upper body assist Assist Level: Minimal Assistance - Patient > 75%    Lower Body Dressing/Undressing Lower body dressing      What is the patient wearing?: Pants     Lower body assist Assist for lower body dressing: Maximal Assistance - Patient 25 - 49%     Toileting Toileting    Toileting assist Assist for toileting: Total Assistance - Patient < 25%     Transfers Chair/bed transfer  Transfers assist  Chair/bed transfer activity did not occur: Safety/medical concerns  Chair/bed transfer assist level: Moderate Assistance - Patient 50 - 74%     Locomotion Ambulation   Ambulation assist   Ambulation activity did not occur: Safety/medical concerns          Walk 10 feet activity   Assist  Walk 10 feet activity did not occur: Safety/medical concerns        Walk 50 feet activity   Assist Walk 50 feet  with 2 turns activity did not occur: Safety/medical concerns         Walk 150 feet activity   Assist Walk 150 feet activity did not occur: Safety/medical concerns         Walk 10 feet on uneven surface  activity   Assist Walk 10 feet on uneven surfaces activity did not occur: Safety/medical concerns         Wheelchair     Assist Will patient use wheelchair at discharge?: No   Wheelchair activity  did not occur: Safety/medical concerns         Wheelchair 50 feet with 2 turns activity    Assist    Wheelchair 50 feet with 2 turns activity did not occur: Safety/medical concerns       Wheelchair 150 feet activity     Assist Wheelchair 150 feet activity did not occur: Safety/medical concerns        Medical Problem List and Plan: 1.  Deficits with mobility, transfers, endurance, weakness secondary to multifactorial debility.  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       2.  Antithrombotics: -DVT/anticoagulation:  Pharmaceutical: Other (comment)--Eliquis. Continue to monitor hemoglobin as well as signs of bleeding.             -antiplatelet therapy: N/A 3. Pain Management: Tylenol prn 4. Mood: LCSW to follow for evaluate              -antipsychotic agents: N/A 5. Neuropsych: This patient is capable of making decisions on his own behalf. 6. Skin/Wound Care: Routine pressure relief measures.  7. Fluids/Electrolytes/Nutrition: Monitor I/O. Continue ensure bid.   Increasing BUN/creatinine ratio, encourage fluids, appreciate IM and cardiology consultation  -some improvement from 5/7- 5/8--, on 511 BUN was 27 with normal creatinine 8. Empyema due to E coli: Continue ampicillin --Antibiotic day #23/28.  CT chest prior to d/c antibiotics? 9. ABLA due to GIB/spontaneous thigh hematoma: Continue protonix bid. Received 1 unit PRBC 4/24 and feraheme 4/26.   Now on iron supplement.  Added folic acid due to low folate levels.    Hemoglobin 7.2 on 4/30, hemoglobin 7.6 on 5/7, now up to 8.2 on 5/11, no need for transfusion   10. ICM/Acute systolic CHF: Monitor daily weights.  Monitor for signs/symptoms of fluid overload.  BP remains soft--no ARB. Continue coreg and Crestor.  Filed Weights   12/05/18 0500 12/06/18 0500 12/08/18 0427  Weight: 123.6 kg 123.4 kg 122.5 kg   Fluctuating weight  Technique, LE no edema s/p Unna boot removal  -continue to monitor  -oxygen at night, no CPAP right now 11. 3 vessel CAD: Not a surgical candidate-treated medically 12. Paroxymal A Fib with RVR: Monitor HR bid-continue amiodarone, coreg and Eliquis. Cards recommends re-eval prior to discharge.  13. Bradycardia/tachy: rate controlled at present    Coreg 3.125  twice daily, still is off digoxin. amio 200mg  bid     -cardiology has seen pt 14. Protein calorie malnutrition: Continue to offer supplements tid. Magnesium supplemented yesterday due to low normal levels.  Added Mg supplement..  15. Orthostatic hypotension: Keep HOB > 30 degrees.   -Thigh high TEDs.  Abdominal binder if needed, encourage fluids   tolerates therapy May DC O2 sat monitor  LOS: 13 days A FACE TO FACE EVALUATION WAS PERFORMED  Aaron [name] 12/08/2018, 8:44 AM

## 2018-12-08 NOTE — Progress Notes (Signed)
Occupational Therapy Session Note  Patient Details  Name: Aaron [name] MRN: [id number] Date of Birth: 06/26/1966  Today's Date: 12/08/2018 OT Individual Time: 1419-1529 OT Individual Time Calculation (min): 70 min    Short Term Goals: Week 2:  OT Short Term Goal 1 (Week 2): Pt will complete 1/3 components of LB dressing using AE as needed. OT Short Term Goal 2 (Week 2): Pt will demonstrated B UE HEP independent with use of paper handout as needed. OT Short Term Goal 3 (Week 2): Pt will perform UB dressing with min A consistently in order to decrease level of assist with self care.  Skilled Therapeutic Interventions/Progress Updates:    Pt greeted sitting in wc and agreeable to OT treatment session despite fatigue. Worked on slideboard transfers wc>therapy mat with max A +2 2/2 fatigue. Worked on sitting balance and trunk strengthening passing bosu ball overhead, then forward. Pt completed 3 sets of 10 with extended rest breaks in between sets. R LE strengthening with ball kick activity. Mat raised up and LLE supported on block step. OT facilitated full knee extension to kick ball. 4 sets of 10. Seated toe taps on small cone with OT facilitation to achieve Hip flexion, then pt able to flex/extend ankle to tap toe. 4 sets of 10. Pt reported max fatigue and required max A+2 to slideboard back to wc. Pt brought back to room and maxi lift used to transfer pt back into bed with total A. Pt left semi-reclined in bed with call bell in reach and needs met.   Therapy Documentation Precautions:  Precautions Precautions: Fall Precaution Comments: monitor BP, hypotensive in sitting/standing Required Braces or Orthoses: Other Brace Other Brace: RLE Bledsoe brace locked in extension when ambulating that can be d/c'd when pt can perform 10 SLRs without assist Restrictions Weight Bearing Restrictions: No RLE Weight Bearing: Weight bearing as tolerated LLE Weight Bearing: Weight bearing as  tolerated Pain: Denies pain  Therapy/Group: Individual Therapy   S  12/08/2018, 3:21 PM  

## 2018-12-08 NOTE — Plan of Care (Signed)
  Problem: RH BOWEL ELIMINATION Goal: RH STG MANAGE BOWEL WITH ASSISTANCE Description STG Manage Bowel with Assistance. Mod  Outcome: Progressing   Problem: RH BLADDER ELIMINATION Goal: RH STG MANAGE BLADDER WITH ASSISTANCE Description STG Manage Bladder With Assistance. Min  Outcome: Progressing   Problem: RH SKIN INTEGRITY Goal: RH STG SKIN FREE OF INFECTION/BREAKDOWN Description Free of skin breakdown, infection with mod assist  Outcome: Progressing   Problem: RH PAIN MANAGEMENT Goal: RH STG PAIN MANAGED AT OR BELOW PT'S PAIN GOAL Description No pain or less than 3  Outcome: Progressing

## 2018-12-08 NOTE — Progress Notes (Signed)
Physical Therapy Session Note  Patient Details  Name: Aaron [name] MRN: [id number] Date of Birth: 07/24/1966  Today's Date: 12/08/2018 PT Individual Time: 0052-5910 PT Individual Time Calculation (min): 60 min   Short Term Goals: Week 2:  PT Short Term Goal 1 (Week 2): Pt will perform bed mobility with mod assist consistently  PT Short Term Goal 2 (Week 2): Pt will tolerate 3 hour OOB in WC  PT Short Term Goal 3 (Week 2): Pt will be able to maintain standing in vital go bed > 5 minutes at > 60 deg  PT Short Term Goal 4 (Week 2): Pt will propel WC 145f with min assist  Skilled Therapeutic Interventions/Progress Updates: Pt presented in bed agreeable to therapy. Pt indicating incontinence and requires changing. Pt performed several rolls L/R with CGA fading to minA +1 (2nd person)with fatigue and use of bed rail to allow PTA to perform peri care. Pt was able to pull self up in bed to allow adjustment to brief once placed. After rest pt performed bed mobility minA for truncal support and was able to scoot hips anteriorly with increased time. Performed SB transfer min/modA to level w/c requiring increased time due to fatigue. Pt transported to rehab gym for adjustment to leg rests and pt was able to propel w/c approx 748fwith x 2 brief rests. Pt agreeable to remain in w/c at end of session until next therapy session and left with call bell within reach and needs met.      Therapy Documentation Precautions:  Precautions Precautions: Fall Precaution Comments: monitor BP, hypotensive in sitting/standing Required Braces or Orthoses: Other Brace Other Brace: RLE Bledsoe brace locked in extension when ambulating that can be d/c'd when pt can perform 10 SLRs without assist Restrictions Weight Bearing Restrictions: No RLE Weight Bearing: Weight bearing as tolerated LLE Weight Bearing: Weight bearing as tolerated    Therapy/Group: Individual Therapy  Marya Lowden  Leilene Diprima,  PTA  12/08/2018, 2:56 PM

## 2018-12-09 ENCOUNTER — Inpatient Hospital Stay (HOSPITAL_COMMUNITY): Payer: Self-pay

## 2018-12-09 ENCOUNTER — Inpatient Hospital Stay (HOSPITAL_COMMUNITY): Payer: Self-pay | Admitting: Occupational Therapy

## 2018-12-09 ENCOUNTER — Encounter (HOSPITAL_COMMUNITY): Payer: Self-pay | Admitting: Psychology

## 2018-12-09 ENCOUNTER — Inpatient Hospital Stay (HOSPITAL_COMMUNITY): Payer: Self-pay | Admitting: Physical Therapy

## 2018-12-09 NOTE — Patient Care Conference (Addendum)
Inpatient RehabilitationTeam Conference and Plan of Care Update Date: 12/09/2018   Time: 10:49 AM    Patient Name: Aaron [name]      Medical Record Number: [id number]  Date of Birth: [birth year] Sex: Male         Room/Bed: 4W14C/4W14C-01 Payor Info: Payor: MEDICAID PENDING / Plan: MEDICAID PENDING / Product Type: *No Product type* /    Admitting Diagnosis: Cardiac; 1404D; 14-16days  Admit Date/Time:  11/25/2018  3:50 PM Admission Comments: No comment available   Primary Diagnosis:  <principal problem not specified> Principal Problem: <principal problem not specified>  Patient Active Problem List   Diagnosis Date Noted  . Chronic systolic CHF (congestive heart failure) (HCC) 11/30/2018  . Orthostasis   . Empyema (HCC)   . Elevated BUN   . PAF (paroxysmal atrial fibrillation) (HCC)   . Coronary artery disease involving native coronary artery of native heart without angina pectoris   . Acute systolic congestive heart failure (HCC)   . Pleural effusion   . Empyema of pleural space (HCC) 11/12/2018  . Debility 11/05/2018  . Pressure injury of skin 11/04/2018  . Iron deficiency anemia   . Orthostatic hypotension   . Hematoma of right thigh 10/30/2018  . Acute blood loss anemia 10/30/2018  . Tobacco abuse 10/30/2018  . Gastritis with bleeding 10/30/2018  . Esophagitis, [place] grade C 10/30/2018  . Protein-calorie malnutrition, severe 10/26/2018  . SOB (shortness of breath)   . Hypokalemia   . Atrial fibrillation with rapid ventricular response (HCC) 10/12/2018  . Morbid obesity (HCC) 10/09/2018  . Acute systolic CHF (congestive heart failure) (HCC) 10/02/2018  . Acute hypoxemic respiratory failure (HCC) 10/02/2018  . Anasarca 10/01/2018  . AKI (acute kidney injury) (HCC) 10/01/2018    Expected Discharge Date: Expected Discharge Date: 12/18/18  Team Members Present: Physician leading conference: Dr. Claudette Laws Social Worker Present: Dossie Der, LCSW Nurse  Present: Doran Durand, LPN PT Present: Grier Rocher, PT;Rosita Dechalus, PTA OT Present: Jackquline Denmark, OT SLP Present: Reuel Derby, SLP PPS Coordinator present : Fae Pippin     Current Status/Progress Goal Weekly Team Focus  Medical   Incont of bowel and bladder incont, MASD groin, pain is controlled, MinA UE ADLs, Max with LE  manage bowel adn bladder in home setting, fluid balance  improve bowel and bladder incont, toileting program   Bowel/Bladder   Bladder comtinent, incontinent of bowel  remain continent offer toileting prn  assess toileting needs qshift and PRN   Swallow/Nutrition/ Hydration             ADL's   Max A overall for LB self care, Min A UB self care, slideboard transfers with Min-Mod A  min A overall with mod A for sit <>stand  functional transfers, self care retraining, activity tolerance, strengthening, sitting balance    Mobility   CGA to minA rolling, minA supine to sit with use of bed features, min to modA SB transfer pending fatigue level. Dependent STS from elevated mat (+2)Supervision w/c propulsion 34ft without break, up to 15ft with multiple rests  Mod assist overall at [facility] level.   transfers, LE strengthening, endurance   Communication             Safety/Cognition/ Behavioral Observations            Pain   Denies pain  remain free of pain  assess pain qshift and PRN   Skin   MASD to groin/peri area.   no further breakdown or infection  of skin. Improvement of current deficits  assess skin qshift and PRN        *See Care Plan and progress notes for long and short-term goals.     Barriers to Discharge  Current Status/Progress Possible Resolutions Date Resolved   Physician    Medical stability;IV antibiotics;Other (comments)  ?motivation  very slow progress  trial toileting on bed pain every 2 hours      Nursing                  PT                    OT                  SLP                SW                Discharge  Planning/Teaching Needs:  Wife aware of the amount of care pt will be and their family plasn on providing this level of care. Medicaid pending      Team Discussion:  Making progress this week, still will require much care at discharge. Medically stable cardiology signed off and hemoglobin slowing improving. Skin looks good and occassionally incontinent of bowel. Will need family education prior to DC home. O2 at night. Endurance improving and activity tolerance improving. Pt unrealistic with his expectations.  Revisions to Treatment Plan:  DC 5/22    Continued Need for Acute Rehabilitation Level of Care: The patient requires daily medical management by a physician with specialized training in physical medicine and rehabilitation for the following conditions: Daily direction of a multidisciplinary physical rehabilitation program to ensure safe treatment while eliciting the highest outcome that is of practical value to the patient.: Yes Daily medical management of patient stability for increased activity during participation in an intensive rehabilitation regime.: Yes Daily analysis of laboratory values and/or radiology reports with any subsequent need for medication adjustment of medical intervention for : Neurological problems   I attest that I was present, lead the team conference, and concur with the assessment and plan of the team. Teleconference held due to COVID-19   Lucy Chris 12/09/2018, 10:49 AM

## 2018-12-09 NOTE — Progress Notes (Signed)
PHYSICAL MEDICINE & REHABILITATION PROGRESS NOTE   Subjective/Complaints:  incont of stool  Call bell was not available, no abd pain. ROS: Patient denies , nausea, vomiting, diarrhea, cough, shortness of breath or chest pain, Objective:   No results found. Recent Labs    12/07/18 0405  WBC 7.7  HGB 8.2*  HCT 27.3*  PLT 242   Recent Labs    12/07/18 0405  NA 139  K 3.7  CL 105  CO2 26  GLUCOSE 92  BUN 27*  CREATININE 0.87  CALCIUM 8.3*    Intake/Output Summary (Last 24 hours) at 12/09/2018 0848 Last data filed at 12/09/2018 0700 Gross per 24 hour  Intake 580 ml  Output 850 ml  Net -270 ml     Physical Exam: Vital Signs Blood pressure 123/85, pulse 77, temperature 97.9 F (36.6 C), resp. rate 19, height 6' 0.4" (1.839 m), weight 120.2 kg, SpO2 91 %. Constitutional: No distress . Vital signs reviewed. HEENT: EOMI, oral membranes moist Neck: supple Cardiovascular: RRR without murmur. No JVD    Respiratory: CTA Bilaterally without wheezes or rales. Normal effort,    GI: BS +, non-tender, non-distended  Musc: Edema in bilateral feet Neurologic: Alert Motor: Bilateral upper extremities: 4/5 proximal distal Bilateral lower extremities:  Right lower extremity: Hip flexion 2/5, knee extension 2+/5, ankle dorsiflexion 3/5, stable Left lower extremity: Hip flexion 3-/5, knee extension 3/5, ankle dorsiflexion 4/5, stable Skin: Lower extremities and Unna boots Sensation reduced right thigh, anterior medial and lateral aspect including the right prepatellar area--stable Psych: pleasant  Assessment/Plan: 1. Functional deficits secondary to debility secondary to CHF and empyema which require 3+ hours per day of interdisciplinary therapy in a comprehensive inpatient rehab setting.  Physiatrist is providing close team supervision and 24 hour management of active medical problems listed below.  Physiatrist and rehab team continue to assess barriers to  discharge/monitor patient progress toward functional and medical goals  Care Tool:  Bathing  Bathing activity did not occur: Refused Body parts bathed by patient: Right arm, Left arm, Chest, Abdomen, Face   Body parts bathed by helper: Right lower leg, Left lower leg Body parts n/a: Front perineal area, Buttocks   Bathing assist Assist Level: Minimal Assistance - Patient > 75%     Upper Body Dressing/Undressing Upper body dressing   What is the patient wearing?: Pull over shirt    Upper body assist Assist Level: Set up assist    Lower Body Dressing/Undressing Lower body dressing      What is the patient wearing?: Pants     Lower body assist Assist for lower body dressing: Maximal Assistance - Patient 25 - 49%     Toileting Toileting    Toileting assist Assist for toileting: Total Assistance - Patient < 25%     Transfers Chair/bed transfer  Transfers assist  Chair/bed transfer activity did not occur: Safety/medical concerns  Chair/bed transfer assist level: Minimal Assistance - Patient > 75%     Locomotion Ambulation   Ambulation assist   Ambulation activity did not occur: Safety/medical concerns          Walk 10 feet activity   Assist  Walk 10 feet activity did not occur: Safety/medical concerns        Walk 50 feet activity   Assist Walk 50 feet with 2 turns activity did not occur: Safety/medical concerns         Walk 150 feet activity   Assist Walk 150 feet activity did not  occur: Safety/medical concerns         Walk 10 feet on uneven surface  activity   Assist Walk 10 feet on uneven surfaces activity did not occur: Safety/medical concerns         Wheelchair     Assist Will patient use wheelchair at discharge?: No   Wheelchair activity did not occur: Safety/medical concerns         Wheelchair 50 feet with 2 turns activity    Assist    Wheelchair 50 feet with 2 turns activity did not occur:  Safety/medical concerns       Wheelchair 150 feet activity     Assist Wheelchair 150 feet activity did not occur: Safety/medical concerns        Medical Problem List and Plan: 1.  Deficits with mobility, transfers, endurance, weakness secondary to multifactorial debility.  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 Formulized individual treatment plan based on medical history, underlying problem and comorbidities.       2.  Antithrombotics: -DVT/anticoagulation:  Pharmaceutical: Other (comment)--Eliquis. Continue to monitor hemoglobin as well as signs of bleeding.             -antiplatelet therapy: N/A 3. Pain Management: Tylenol prn 4. Mood: LCSW to follow for evaluate              -antipsychotic agents: N/A 5. Neuropsych: This patient is capable of making decisions on his own behalf. 6. Skin/Wound Care: Routine pressure relief measures.  7. Fluids/Electrolytes/Nutrition: Monitor I/O. Continue ensure bid. I: 865m , O: 11559m Increasing BUN/creatinine ratio, encourage fluids, appreciate IM and cardiology consultation  -some improvement from 5/7- 5/8--, on 511 BUN was 27 with normal creatinine 8. Empyema due to E coli: Continue ampicillin --Antibiotic day #23/28.  CT chest prior to d/c antibiotics? 9. ABLA due to GIB/spontaneous thigh hematoma: Continue protonix bid. Received 1 unit PRBC 4/24 and feraheme 4/26.   Now on iron supplement.  Added folic acid due to low folate levels.    Hemoglobin 7.2 on 4/30, hemoglobin 7.6 on 5/7, now up to 8.2 on 5/11, no need for transfusion   10. ICM/Acute systolic CHF: Monitor daily weights.  Monitor for signs/symptoms of fluid overload.  BP remains soft--no ARB. Continue coreg and Crestor.  Filed Weights   12/06/18 0500 12/08/18 0427 12/09/18 0241   Weight: 123.4 kg 122.5 kg 120.2 kg   Fluctuating weight Technique, LE no edema s/p Unna boot removal  -continue to monitor  -oxygen at night, no CPAP right now 11. 3 vessel CAD: Not a surgical candidate-treated medically 12. Paroxymal A Fib with RVR: Monitor HR bid-continue amiodarone, coreg and Eliquis. Cards recommends re-eval prior to discharge.  13. Bradycardia/tachy: rate controlled at present    Coreg 3.125  twice daily, still is off digoxin. amio 2005mid     -cardiology has seen pt 14. Protein calorie malnutrition: Continue to offer supplements tid. Magnesium supplemented yesterday due to low normal levels.  Added Mg supplement..  15. Orthostatic hypotension: Keep HOB > 30 degrees.   -Thigh high TEDs.  Abdominal binder if needed, encourage fluids   tolerates therapy   LOS: 14 days A FGraingerKirsteins 12/09/2018, 8:48 AM

## 2018-12-09 NOTE — Consult Note (Signed)
Neuropsychological Consultation   Patient:   Charleson Mikulich   DOB:   12/19/1965  MR Number:  [id number]  Location:  [facility] [address] CENTER A [address] [id number] [place] [address] Dept: [phone] Loc: [phone]           Date of Service:   12/09/2018  Start Time:   3 PM End Time:   4 PM  Provider/Observer:  Arley Phenix, Psy.D.       Clinical Neuropsychologist       Billing Code/Service: [phone] 4 Units  Chief Complaint:    Mohammedali Korsak is a 53 year old male without significant prior medical history.  Patient has had significant medical issues starting with his initial admission on 10/01/2018.  Will not repeat this history as it can be found in HPI below.  Patient has significant debility due to extended hospital course.  Patient has returned to [facility] program for further therapeutic interventions.    Reason for Service:  Patient was referred for neuropsychological consultation due to coping and adjustment issues.  Below you will find the HPI for the current admission.  HPI: Wilder Zeni is a 53 year old male without significant medical history who was originally admitted on 10/01/2018 with 3-4 weeks history of progressive edema history taken from chart review and patient.  Patient had edema in bilateral lower extremities along with shortness of breath and difficulty with ambulation.  He was found to have fluid overload with anasarca, ascites, AKI, and abnormal LFTs. 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  He was not felt to be a good surgical candidate. Hospital course complicated by A fib with RVR but patient developed spontaneous right thigh hematoma 3/18 as well as GIB due to esophageal ulcers with gastritis and esophagitis on 3/22. He has also had issue with abdominal pain due to ileus Tx with  NGT, A fib with NSVT requiring amiodarone for rate control,  hyptension reated with NGT, acute systolic CHF as well as  issues with hypotension requiring tilt table for standing. (See prior H&P for full details)  He was admitted to [facility] 11/05/2018 due to debility and required supplemental oxygen due to DOE. On 04/16, he reported productive cough as well as increase in SOB. CXR done revealing concerns of hydro or hemothorax.  CT chest ordered for work up and revealed right sided empyema with complete collapse of RLE and partial collapse of RML/RUL with reactive mediastinal lymph nodes.  CT surgery and TRH were consulted for assistance and he was discharged to acute hospital for monitoring and treatment. Dr. Dorris Fetch recommended VIR chest tube placement. He underwent drainage of 800 cc thick brown purulent appearing fluid on 4/16 with placement of PleurEvac and continued to have drainage.  He was started on Vanc/Zosyn and cultures positive for E coli therefore narrowed to ampicillin on 11/15/2018.  Chest tube placed to water seal due to air leak and kept in place due to ongoing drainage.  On 4/23, he developed bradycardia with HR in 30's therefore digoxin discontinued and coreg decreased 4/26 due to ongoing issues. He was started on trial of clamping, received thrombolytics 11/22/2018 and chest tube removed on 4/27.  Follow up CXR with stable right residual empyema without pneumothorax. Dr. Dorris Fetch recommends "probably 4 weeks antibiotic regimen" to treat empyema. He had drop in H/H to 6.4/21.2 on 4/24 and received 1 units PRBCs.  Anemia of chronic disease supplemented with feraheme 4/26. Respiratory status improved  but he continues to be severely deconditioned. He is able to tolerate Vital Tilt bed at 60 degrees of 10 minutes of standing.  He has been tolerating OOB for 2 hrs/day.  He was cleared to resume CIR course on 4/29.    Current Status:  Patient reports that his mood has really improved has he is seeing significant functional and medical improvements over past week.  Denies significant depressive or anxiety type symptoms.   Cognation was good today.    Behavioral Observation: Nacoma Caraveo  presents as a 53 y.o.-year-old Right Caucasian Male who appeared his stated age. his dress was Appropriate and he was Well Groomed and his manners were Appropriate to the situation.  his participation was indicative of Appropriate and Redirectable behaviors.  There were any physical disabilities noted.  he displayed an appropriate level of cooperation and motivation.     Interactions:    Active Appropriate  Attention:   abnormal and attention span appeared shorter than expected for age  Memory:   within normal limits; recent and remote memory intact  Visuo-spatial:  not examined  Speech (Volume):  normal  Speech:   normal; normal  Thought Process:  Coherent and Relevant  Though Content:  WNL; not suicidal and not homicidal  Orientation:   person, place, time/date and situation  Judgment:   Good  Planning:   Good  Affect:    Appropriate  Mood:    Euthymic  Insight:   Good  Intelligence:   normal  Medical History:   Past Medical History:  Diagnosis Date  . Acute systolic (congestive) heart failure (HCC) 10/2018  . Atrial fibrillation, new onset (HCC) 10/2018  . Coronary artery disease   . Morbid obesity (HCC)    Psychiatric History:  Patient denies prior psychiatric history  Family Med/Psych History:  Family History  Problem Relation Age of Onset  . Alcoholism Father   . Rheumatic fever Mother   . Diabetes Mother   . Drug abuse Brother     Risk of Suicide/Violence: low Patient denies SI or HI.    Impression/DX:  Latrel Luongo is a 53 year old male without significant prior medical history.  Patient has had significant medical issues starting with his initial admission on 10/01/2018.  Will not repeat this history as it can be found in HPI above.  Patient has significant debility due to extended hospital course.  Patient has returned to [facility] program for further therapeutic interventions.  Patient  reports that his mood has really improved has he is seeing significant functional and medical improvements over past week.  Denies significant depressive or anxiety type symptoms.  Cognation was good today.            Electronically Signed   _______________________ Arley Phenix, Psy.D.

## 2018-12-09 NOTE — Progress Notes (Signed)
Physical Therapy Session Note  Patient Details  Name: Aaron [name] MRN: [id number] Date of Birth: 1966-07-11  Today's Date: 12/09/2018 PT Individual Time: 0800-0911 PT Individual Time Calculation (min): 71 min   Short Term Goals: Week 2:  PT Short Term Goal 1 (Week 2): Pt will perform bed mobility with mod assist consistently  PT Short Term Goal 1 - Progress (Week 2): Met PT Short Term Goal 2 (Week 2): Pt will tolerate 3 hour OOB in WC  PT Short Term Goal 2 - Progress (Week 2): Met PT Short Term Goal 3 (Week 2): Pt will be able to maintain standing in vital go bed > 5 minutes at > 60 deg  PT Short Term Goal 3 - Progress (Week 2): Progressing toward goal PT Short Term Goal 4 (Week 2): Pt will propel WC 173f with min assist PT Short Term Goal 4 - Progress (Week 2): Met  Skilled Therapeutic Interventions/Progress Updates: Pt presented in bed agreeable to therapy stating had a good night. Pt noted to have BM, performed rolling L/R with use of bed features. Performed supine to sit with use of bed features and increased time with CGA. Pt able to sit EOB supervision while [facility] for [facility]. Pt able to tolerate SClarise Cruztrf wt bearing through BLE with fair tolerance. Once transferred to w/c pt propelled approx 1017fwith RW with x 3 rest breaks. Pt returned to room and remained in w/c with belt alarm present and call bell within reach.      Therapy Documentation Precautions:  Precautions Precautions: Fall Precaution Comments: monitor BP, hypotensive in sitting/standing Required Braces or Orthoses: Other Brace Other Brace: RLE Bledsoe brace locked in extension when ambulating that can be d/c'd when pt can perform 10 SLRs without assist Restrictions Weight Bearing Restrictions: No RLE Weight Bearing: Weight bearing as tolerated LLE Weight Bearing: Weight bearing as tolerated General:   Vital Signs: Therapy Vitals Temp: 97.7 F (36.5 C) Temp Source: Oral Pulse Rate: 70 Resp: 18 BP:  103/78 Patient Position (if appropriate): Sitting Oxygen Therapy SpO2: 99 % O2 Device: Room Air    Therapy/Group: Individual Therapy  Raizel Wesolowski  Marlowe Lawes, PTA  12/09/2018, 4:24 PM

## 2018-12-09 NOTE — Progress Notes (Addendum)
Social Work [name], male   DOB: 1965-12-10, 53 y.o.   MRN: [id number] Met with pt and spoke with wife via telephone to discuss team conference goals min assist with transfer board and discharge date 5/22. Wife thought he would be here longer but will make arrangements with their son's and come up here for education. Scheduled education for Wed 5/20 @ 9:00 am with wife to learn his care. Discussed his needs hospital bed, wheelchair and transfer board. Will work on discharge needs. Wife has informed him to work on bedside commode transfers. Pt is in agreement with now.

## 2018-12-09 NOTE — Progress Notes (Signed)
Occupational Therapy Session Note  Patient Details  Name: Aaron [name] MRN: [id number] Date of Birth: 1965/11/11  Today's Date: 12/09/2018 OT Individual Time: 1330-1430 OT Individual Time Calculation (min): 60 min    Short Term Goals: Week 2:  OT Short Term Goal 1 (Week 2): Pt will complete 1/3 components of LB dressing using AE as needed. OT Short Term Goal 2 (Week 2): Pt will demonstrated B UE HEP independent with use of paper handout as needed. OT Short Term Goal 3 (Week 2): Pt will perform UB dressing with min A consistently in order to decrease level of assist with self care.  Skilled Therapeutic Interventions/Progress Updates:    Upon entering the room, pt seated in tilt in space wheelchair with no c/o pain and agreeable to OT intervention. Focus of session on toilet transfers. Pt verbalizing step by step direction of transfer prior to board positioning. Pt needing assistance with set up of equipment and placement of board. Min A slide board transfer from wheelchair > drop arm commode chair. Pt needing increased time and min verbal cuing for head hip relationship. Pt taking rest break and then returning back to wheelchair in same manner. Pt reporting buttocks hurting from being in wheelchair over 2 hours since last session. Slide board transfer from wheelchair >bed with mod A secondary to pt fatigue at this point. Sit >supine with Mod A for B LEs. OT repositioned pt in bed with call bell and all needed items within reach.   Therapy Documentation Precautions:  Precautions Precautions: Fall Precaution Comments: monitor BP, hypotensive in sitting/standing Required Braces or Orthoses: Other Brace Other Brace: RLE Bledsoe brace locked in extension when ambulating that can be d/c'd when pt can perform 10 SLRs without assist Restrictions Weight Bearing Restrictions: No RLE Weight Bearing: Weight bearing as tolerated LLE Weight Bearing: Weight bearing as tolerated General:   Vital  Signs: Therapy Vitals Temp: 97.7 F (36.5 C) Temp Source: Oral Pulse Rate: 70 Resp: 18 BP: 103/78 Patient Position (if appropriate): Sitting Oxygen Therapy SpO2: 99 % O2 Device: Room Air   Therapy/Group: Individual Therapy  Alen Bleacher 12/09/2018, 4:22 PM

## 2018-12-09 NOTE — Plan of Care (Signed)
  Problem: RH BOWEL ELIMINATION Goal: RH STG MANAGE BOWEL WITH ASSISTANCE Description STG Manage Bowel with Assistance. Mod  Outcome: Progressing   Problem: RH BLADDER ELIMINATION Goal: RH STG MANAGE BLADDER WITH ASSISTANCE Description STG Manage Bladder With Assistance. Min  Outcome: Progressing   Problem: RH SKIN INTEGRITY Goal: RH STG SKIN FREE OF INFECTION/BREAKDOWN Description Free of skin breakdown, infection with mod assist  Outcome: Progressing   Problem: RH PAIN MANAGEMENT Goal: RH STG PAIN MANAGED AT OR BELOW PT'S PAIN GOAL Description No pain or less than 3  Outcome: Progressing   

## 2018-12-09 NOTE — Progress Notes (Signed)
Physical Therapy Weekly Progress Note  Patient Details  Name: Aaron [name] MRN: [id number] Date of Birth: 01-13-1966  Beginning of progress report period: Dec 02, 2018 End of progress report period: Dec 09, 2018  Today's Date: 12/09/2018 PT Individual Time: 1100-1200 PT Individual Time Calculation (min): 60 min   Patient has met 3 of 4 short term goals.  Pt is making steady progress towards long term goals. Pt has progressed to performed bed mobility with min assist, SB transfers with mod assist, and WC mobility for short distances with supervision assist. Significant improvement in overall activity and moderate improvement in activation of MM in the LLE.   Patient continues to demonstrate the following deficits muscle weakness, muscle joint tightness and muscle paralysis, decreased cardiorespiratoy endurance and decreased oxygen support, abnormal tone, decreased attention, decreased awareness, decreased problem solving, decreased safety awareness, decreased memory and delayed processing and decreased sitting balance, decreased standing balance, decreased balance strategies and difficulty maintaining precautions and therefore will continue to benefit from skilled PT intervention to increase functional independence with mobility.  Patient progressing toward long term goals..  Continue plan of care.  PT Short Term Goals Week 2:  PT Short Term Goal 1 (Week 2): Pt will perform bed mobility with mod assist consistently  PT Short Term Goal 1 - Progress (Week 2): Met PT Short Term Goal 2 (Week 2): Pt will tolerate 3 hour OOB in WC  PT Short Term Goal 2 - Progress (Week 2): Met PT Short Term Goal 3 (Week 2): Pt will be able to maintain standing in vital go bed > 5 minutes at > 60 deg  PT Short Term Goal 3 - Progress (Week 2): Progressing toward goal PT Short Term Goal 4 (Week 2): Pt will propel WC 121f with min assist PT Short Term Goal 4 - Progress (Week 2): Met Week 3:  PT Short Term Goal 1 (Week  3): Pt will perform bed mobility with supervision assist  PT Short Term Goal 2 (Week 3): Pt will perform SB transfer to and from [facility]with CGA  PT Short Term Goal 3 (Week 3): Pt will perform sit<>stand at rail or in parallel bars with max assist  PT Short Term Goal 4 (Week 3): Pt will propell WC 151fwith supervision assist and no more than 1 rest break   Skilled Therapeutic Interventions/Progress Updates:   Pt received supine in bed and agreeable to PT. Supine>sit transfer with supervision-CGA and min cues for RLE management and improved rails. SB [facility] to [facility] min assist and increased time and cues for UE placement to improve safety.   PT instructed pt in BLE therex while sitting in TIS WC. AAROM LAQ x 10 on the R and 5# ankle weight on the L. Hip flexion x 10 BLE with AAROM to prevent use of ankle PF. Hip abduction with level 1 tband 2x 10 bil. Isometric hip adduction 2 x 12. Ankle DF 2 x 20. Kinetron reciprocal movement training and forced use extensor pattern muscles 5 x 1 min with 2 min rest break between bouts  30-40 cm/sec. Cues for improved diaphragmatic breathing throughout. SpO2 monitored with activity at >95% on room air consistently.   Patient returned to room and [facility]ith [facility] in reach and all needs met.        Therapy Documentation Precautions:  Precautions Precautions: Fall Precaution Comments: monitor BP, hypotensive in sitting/standing Required Braces or Orthoses: Other Brace Other Brace: RLE Bledsoe brace locked  in extension when ambulating that can be d/c'd when pt can perform 10 SLRs without assist Restrictions Weight Bearing Restrictions: No RLE Weight Bearing: Weight bearing as tolerated LLE Weight Bearing: Weight bearing as tolerated    Pain: Denies   Therapy/Group: Individual Therapy  Lorie Phenix 12/09/2018, 10:52 AM

## 2018-12-10 ENCOUNTER — Inpatient Hospital Stay (HOSPITAL_COMMUNITY): Payer: Self-pay | Admitting: Occupational Therapy

## 2018-12-10 ENCOUNTER — Inpatient Hospital Stay (HOSPITAL_COMMUNITY): Payer: Self-pay | Admitting: Physical Therapy

## 2018-12-10 NOTE — Progress Notes (Signed)
Social Work [name], male   DOB: [birth year], 53 y.o.   MRN: [id number]  Diagnosis code:I50.23, J86.9, I50.21, I25.10  Height: 6'0  Weight:260   Patient has CHF and empyema which requires him  to be positioned in ways not feasible with a normal bed.  Head must be elevated at least 30% and legs elevated also. Requires frequent and immediate changes in body position which cannot be achieved with a normal bed. Legs needs to be elevated due to heart failure.

## 2018-12-10 NOTE — Progress Notes (Signed)
Physical Therapy Session Note  Patient Details  Name: Aaron [name] MRN: [id number] Date of Birth: 03/03/1966  Today's Date: 12/10/2018 PT Individual Time: 1600-1700 PT Individual Time Calculation (min): 60 min   Short Term Goals: Week 3:  PT Short Term Goal 1 (Week 3): Pt will perform bed mobility with supervision assist  PT Short Term Goal 2 (Week 3): Pt will perform SB transfer to and from WC with CGA  PT Short Term Goal 3 (Week 3): Pt will perform sit<>stand at rail or in parallel bars with max assist  PT Short Term Goal 4 (Week 3): Pt will propell WC 150ft with supervision assist and no more than 1 rest break   Skilled Therapeutic Interventions/Progress Updates:   Pt received supine in bed and agreeable to PT. PT treatment focused on standing tolerance, LE and UE strengthening. Standing on tilt table bed 7 bouts x 4-6 min each progressing from 45 deg top 65deg. In standing for each angle, pt asymptomatic for orthostasis. Pt performed 2 bouts of knee extension with straps to prevent knee collapse in relaxation. Decreased knee ROM into full extension as pt fatigued. In standing, performed bicep curls, shoulder flexion, chest press, shoulder press with 3# bar weight 3 x 12 for each exercise. Cues for improved breathing technique and full ROM. Pt returned to supine and left with call bell in reach and all needs met.         Therapy Documentation Precautions:  Precautions Precautions: Fall Precaution Comments: monitor BP, hypotensive in sitting/standing Required Braces or Orthoses: Other Brace Other Brace: RLE Bledsoe brace locked in extension when ambulating that can be d/c'd when pt can perform 10 SLRs without assist Restrictions Weight Bearing Restrictions: No RLE Weight Bearing: Weight bearing as tolerated LLE Weight Bearing: Weight bearing as tolerated Vital Signs: Therapy Vitals Temp: (!) 97.5 F (36.4 C) Temp Source: Oral Pulse Rate: 71 Resp: (!) 24 BP: 95/65 Patient  Position (if appropriate): Lying Oxygen Therapy SpO2: 98 % O2 Device: Room Air Pain: denies   Therapy/Group: Individual Therapy   E  12/10/2018, 5:05 PM  

## 2018-12-10 NOTE — Progress Notes (Signed)
Occupational Therapy Weekly Progress Note  Patient Details  Name: Aaron [name] MRN: [id number] Date of Birth: 25-Sep-1965  Beginning of progress report period: Dec 04, 2018 End of progress report period: Dec 10, 2018  Today's Date: 12/10/2018 OT Individual Time: 0800-0829 and 1330-1400 OT Individual Time Calculation (min): 29 min and 30 min   Patient has met 3 of 3 short term goals. Pt making progress this week towards occupational therapy goals. Pt able to complete slide board transfer to drop arm commode chair with mod - max A but not consistently. Pt has been able to slide board with min A but then once fatigued pt needing max +2 or use of maximove for safety to return to bed. Pt donning pull over clothing with min A overall. Pt does not wish to utilize AE for LB dressing secondary to level of exertion to use but continues to practice during sessions. Pt able to tolerate 1- 1.5 hours sitting up in wheelchair.   Patient continues to demonstrate the following deficits: muscle weakness, decreased cardiorespiratoy endurance, decreased motor planning, decreased awareness, decreased problem solving and decreased safety awareness and decreased sitting balance and decreased balance strategies and therefore will continue to benefit from skilled OT intervention to enhance overall performance with BADL and Reduce care partner burden.  Patient not progressing toward long term goals.  See goal revision..  Plan of care revisions: Goals discontinued for standing balance and shower transfer secondary to safety. Goals downgraded to max A for toileting and LB self care with mod A for functional transfers for safety.  OT Short Term Goals Week 2:  OT Short Term Goal 1 (Week 2): Pt will complete 1/3 components of LB dressing using AE as needed. OT Short Term Goal 1 - Progress (Week 2): Met OT Short Term Goal 2 (Week 2): Pt will demonstrated B UE HEP independent with use of paper handout as needed. OT Short Term  Goal 2 - Progress (Week 2): Met OT Short Term Goal 3 (Week 2): Pt will perform UB dressing with min A consistently in order to decrease level of assist with self care. OT Short Term Goal 3 - Progress (Week 2): Met Week 3:  OT Short Term Goal 1 (Week 3): STGs=LTGs secondary to upcoming discharge  Skilled Therapeutic Interventions/Progress Updates:    Session 1: Upon entering the room, pt supine in bed and requesting to dress with second session. Pt demonstrated B UE strengthening HEP with use of green level 3 theraband independently this session without cuing for technique. Pt did need cuing for pursed lip breathing and encouragement. Pt remained in bed with call bell and all needed items within reach.   Session 2: Upon entering the room, pt seated in wheelchair with c/o pain in buttocks. Pt reports, " I sat in this chair too long." Pt set up for slide board transfer back into bed but then refused secondary to level of fatigue. Maxi move utilized to transfer pt into bed safely. Pt needing mod A for positioning in bed. Pt verbalized fatigue and OT discussed concerns for home if this were to happen. Discussed possibility of needing a lift depending on pt's progress until discharge. Family education to start next week. Call bell and all needed items within reach upon exiting the room.   Therapy Documentation Precautions:  Precautions Precautions: Fall Precaution Comments: monitor BP, hypotensive in sitting/standing Required Braces or Orthoses: Other Brace Other Brace: RLE Bledsoe brace locked in extension when ambulating that can be d/c'd when pt  can perform 10 SLRs without assist Restrictions Weight Bearing Restrictions: No RLE Weight Bearing: Weight bearing as tolerated LLE Weight Bearing: Weight bearing as tolerated General:   Vital Signs: Therapy Vitals Temp: 97.7 F (36.5 C) Temp Source: Oral Pulse Rate: 75 Resp: 18 BP: 115/81 Patient Position (if appropriate): Lying Oxygen  Therapy SpO2: 95 % O2 Device: Room Air Pain: Pain Assessment Pain Scale: 0-10 Pain Score: 0-No pain ADL:   Vision   Perception    Praxis   Exercises:   Other Treatments:     Therapy/Group: Individual Therapy  Gypsy Decant 12/10/2018, 8:53 AM

## 2018-12-10 NOTE — Plan of Care (Signed)
  Problem: RH BOWEL ELIMINATION Goal: RH STG MANAGE BOWEL WITH ASSISTANCE Description STG Manage Bowel with Assistance. Mod  Outcome: Progressing   Problem: RH BLADDER ELIMINATION Goal: RH STG MANAGE BLADDER WITH ASSISTANCE Description STG Manage Bladder With Assistance. Min  Outcome: Progressing   Problem: RH SKIN INTEGRITY Goal: RH STG SKIN FREE OF INFECTION/BREAKDOWN Description Free of skin breakdown, infection with mod assist  Outcome: Progressing   Problem: RH PAIN MANAGEMENT Goal: RH STG PAIN MANAGED AT OR BELOW PT'S PAIN GOAL Description No pain or less than 3  Outcome: Progressing   

## 2018-12-10 NOTE — Progress Notes (Signed)
[facility] PHYSICAL MEDICINE & REHABILITATION PROGRESS NOTE   Subjective/Complaints:  Still c/o weakness, discussed needs for d/c  ROS: Patient denies , nausea, vomiting, diarrhea, cough, shortness of breath or chest pain, Objective:   No results found. No results for input(s): WBC, HGB, HCT, PLT in the last 72 hours. No results for input(s): NA, K, CL, CO2, GLUCOSE, BUN, CREATININE, CALCIUM in the last 72 hours.  Intake/Output Summary (Last 24 hours) at 12/10/2018 0830 Last data filed at 12/10/2018 0749 Gross per 24 hour  Intake 387 ml  Output 950 ml  Net -563 ml     Physical Exam: Vital Signs Blood pressure 115/81, pulse 75, temperature 97.7 F (36.5 C), temperature source Oral, resp. rate 18, height 6' 0.4" (1.839 m), weight 119.7 kg, SpO2 95 %. Constitutional: No distress . Vital signs reviewed. HEENT: EOMI, oral membranes moist Neck: supple Cardiovascular: RRR without murmur. No JVD    Respiratory: CTA Bilaterally without wheezes or rales. Normal effort, Pisek   GI: BS +, non-tender, non-distended  Musc: Edema in bilateral feet Neurologic: Alert Motor: Bilateral upper extremities: 4/5 proximal distal Bilateral lower extremities:  Right lower extremity: Hip flexion 2/5, knee extension 2+/5, ankle dorsiflexion 3/5, stable Left lower extremity: Hip flexion 3-/5, knee extension 3/5, ankle dorsiflexion 4/5, stable Skin: Lower extremities and Unna boots Sensation reduced right thigh, anterior medial and lateral aspect including the right prepatellar area--stable Psych: pleasant  Assessment/Plan: 1. Functional deficits secondary to debility secondary to CHF and empyema which require 3+ hours per day of interdisciplinary therapy in a comprehensive inpatient rehab setting.  Physiatrist is providing close team supervision and 24 hour management of active medical problems listed below.  Physiatrist and rehab team continue to assess barriers to discharge/monitor patient progress  toward functional and medical goals  Care Tool:  Bathing  Bathing activity did not occur: Refused Body parts bathed by patient: Right arm, Left arm, Chest, Abdomen, Face   Body parts bathed by helper: Right lower leg, Left lower leg Body parts n/a: Front perineal area, Buttocks   Bathing assist Assist Level: Minimal Assistance - Patient > 75%     Upper Body Dressing/Undressing Upper body dressing   What is the patient wearing?: Pull over shirt    Upper body assist Assist Level: Set up assist    Lower Body Dressing/Undressing Lower body dressing      What is the patient wearing?: Pants     Lower body assist Assist for lower body dressing: Maximal Assistance - Patient 25 - 49%     Toileting Toileting    Toileting assist Assist for toileting: Total Assistance - Patient < 25%     Transfers Chair/bed transfer  Transfers assist  Chair/bed transfer activity did not occur: Safety/medical concerns  Chair/bed transfer assist level: Minimal Assistance - Patient > 75%     Locomotion Ambulation   Ambulation assist   Ambulation activity did not occur: Safety/medical concerns          Walk 10 feet activity   Assist  Walk 10 feet activity did not occur: Safety/medical concerns        Walk 50 feet activity   Assist Walk 50 feet with 2 turns activity did not occur: Safety/medical concerns         Walk 150 feet activity   Assist Walk 150 feet activity did not occur: Safety/medical concerns         Walk 10 feet on uneven surface  activity   Assist Walk 10 feet  on uneven surfaces activity did not occur: Safety/medical concerns         Wheelchair     Assist Will patient use wheelchair at discharge?: Yes Type of Wheelchair: Manual Wheelchair activity did not occur: Safety/medical concerns  Wheelchair assist level: Supervision/Verbal cueing Max wheelchair distance: 157ft    Wheelchair 50 feet with 2 turns activity    Assist     Wheelchair 50 feet with 2 turns activity did not occur: Safety/medical concerns   Assist Level: Supervision/Verbal cueing   Wheelchair 150 feet activity     Assist Wheelchair 150 feet activity did not occur: Safety/medical concerns        Medical Problem List and Plan: 1.  Deficits with mobility, transfers, endurance, weakness secondary to multifactorial debility.  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  Antithrombotics: -DVT/anticoagulation:  Pharmaceutical: Other (comment)--Eliquis. Continue to monitor hemoglobin as well as signs of bleeding.             -antiplatelet therapy: N/A 3. Pain Management: Tylenol prn 4. Mood: LCSW to follow for evaluate              -antipsychotic agents: N/A 5. Neuropsych: This patient is capable of making decisions on his own behalf. 6. Skin/Wound Care: Routine pressure relief measures.  7. Fluids/Electrolytes/Nutrition: Monitor I/O. Continue ensure bid. I: , O:  Increasing BUN/creatinine ratio, encourage fluids, appreciate IM and cardiology consultation  -some improvement from 5/7- 5/8--, on 511 BUN was 27 with normal creatinine 8. Empyema due to E coli: Continue ampicillin --Antibiotic day #24/28.  CT chest prior to d/c antibiotics? 9. ABLA due to GIB/spontaneous thigh hematoma: Continue protonix bid. Received 1 unit PRBC 4/24 and feraheme 4/26.   Now on iron supplement.  Added folic acid due to low folate levels.    Hemoglobin 7.2 on 4/30, hemoglobin 7.6 on 5/7, now up to 8.2 on 5/11, no need for transfusion   10. ICM/Acute systolic CHF: Monitor daily weights.  Monitor for signs/symptoms of fluid overload.  BP remains soft--no ARB. Continue coreg and Crestor.  Filed Weights   12/08/18 0427 12/09/18 0241 12/10/18 0606  Weight: 122.5 kg 120.2 kg 119.7 kg   Fluctuating weight Technique, LE no edema s/p Unna boot removal  -continue to  monitor  -oxygen at night, no CPAP right now 11. 3 vessel CAD: Not a surgical candidate-treated medically 12. Paroxymal A Fib with RVR: Monitor HR bid-continue amiodarone, coreg and Eliquis. Cards recommends re-eval prior to discharge.  13. Bradycardia/tachy: rate controlled at present    Coreg 3.125  twice daily, still is off digoxin. amio 200mg  bid     -cardiology has seen pt 14. Protein calorie malnutrition: Continue to offer supplements tid. Magnesium supplemented yesterday due to low normal levels.  Added Mg supplement..  15. Orthostatic hypotension: Keep HOB > 30 degrees.   -Thigh high TEDs.  Abdominal binder if needed, encourage fluids   tolerates therapy   LOS: 15 days A FACE TO FACE EVALUATION WAS PERFORMED  Erick Colace 12/10/2018, 8:30 AM

## 2018-12-10 NOTE — Progress Notes (Signed)
Physical Therapy Session Note  Patient Details  Name: Aaron [name] MRN: [id number] Date of Birth: 25-Jun-1966  Today's Date: 12/10/2018 PT Individual Time: 1040-1135 PT Individual Time Calculation (min): 55 min   Short Term Goals: Week 3:  PT Short Term Goal 1 (Week 3): Pt will perform bed mobility with supervision assist  PT Short Term Goal 2 (Week 3): Pt will perform SB transfer to and from [facility] with CGA  PT Short Term Goal 3 (Week 3): Pt will perform sit<>stand at rail or in parallel bars with max assist  PT Short Term Goal 4 (Week 3): Pt will propell WC 168f with supervision assist and no more than 1 rest break   Skilled Therapeutic Interventions/Progress Updates:   Pt received supine in bed and agreeable to PT. PT assisted pt to don pants and ted hose at bed level. Supervision assist rolling R and L with heavy use of bed rails. Supine>sit transfer with min assist at trunk.   SB tranfers completed x 3 throughout treatment with min assist overall of each transfer with slight down hill grade. Min cues and assist for RLE positioning and for improved anterior weight shift to reduce sheer forces in transfers.  scooting EOB to the R and L x 4 each with min -CGA from PT for improved LE positioning on the R.    WC mobility with supervision assist from PT x 1213f100ft for a total of 22040fotal. Short rest break between bouts due to fatifuge and mild SOB. SpO2 measured intermittiently throughout treatement, HR ~ 80 bpm and SpO2 >96% on room air. Min cues for improved use of the RUE to prevent hitting rail on R UE as will as pursed lip breathing to prevent desat.   Patient returned to room and [facility] and all needs met.         Therapy Documentation Precautions:  Precautions Precautions: Fall Precaution Comments: monitor BP, hypotensive in sitting/standing Required Braces or Orthoses: Other Brace Other Brace: RLE Bledsoe brace locked in extension when  ambulating that can be d/c'd when pt can perform 10 SLRs without assist Restrictions Weight Bearing Restrictions: No RLE Weight Bearing: Weight bearing as tolerated LLE Weight Bearing: Weight bearing as tolerated    Pain: Pain Assessment Pain Scale: 0-10 Pain Score: 0-No pain   Therapy/Group: Individual Therapy  AusLorie Phenix14/2020, 11:39 AM

## 2018-12-11 ENCOUNTER — Inpatient Hospital Stay (HOSPITAL_COMMUNITY): Payer: Self-pay

## 2018-12-11 ENCOUNTER — Inpatient Hospital Stay (HOSPITAL_COMMUNITY): Payer: Self-pay | Admitting: Physical Therapy

## 2018-12-11 NOTE — Plan of Care (Signed)
  Problem: RH BOWEL ELIMINATION Goal: RH STG MANAGE BOWEL WITH ASSISTANCE Description STG Manage Bowel with Assistance. Mod  Outcome: Progressing   Problem: RH BLADDER ELIMINATION Goal: RH STG MANAGE BLADDER WITH ASSISTANCE Description STG Manage Bladder With Assistance. Min  Outcome: Progressing   Problem: RH SKIN INTEGRITY Goal: RH STG SKIN FREE OF INFECTION/BREAKDOWN Description Free of skin breakdown, infection with mod assist  Outcome: Progressing   Problem: RH PAIN MANAGEMENT Goal: RH STG PAIN MANAGED AT OR BELOW PT'S PAIN GOAL Description No pain or less than 3  Outcome: Progressing   

## 2018-12-11 NOTE — Progress Notes (Signed)
Physical Therapy Session Note  Patient Details  Name: Aaron [name] MRN: [id number] Date of Birth: 12-15-1965  Today's Date: 12/11/2018 PT Individual Time: 0900-1000 1635-1730  PT Individual Time Calculation (min): 60 min and 55 min   Short Term Goals: Week 3:  PT Short Term Goal 1 (Week 3): Pt will perform bed mobility with supervision assist  PT Short Term Goal 2 (Week 3): Pt will perform SB transfer to and from [facility] with CGA  PT Short Term Goal 3 (Week 3): Pt will perform sit<>stand at rail or in parallel bars with max assist  PT Short Term Goal 4 (Week 3): Pt will propell WC 160f with supervision assist and no more than 1 rest break   Skilled Therapeutic Interventions/Progress Updates:  Session 1  Pt received supine in bed and agreeable to PT. Rolling R and L x 2 each with supervision assist to don pants at bed level. PT required to manage clothing. Supine>sit transfer with supervision assist and min cues for log roll and for use of bed rails.   [facility] and increased time. Cues for LE positioning to improve safety and success of transfer.   PT instructed pt in standing tolerance in standing frame 2 x 5 min each. Pt performed forward reach with BUE 2 x 5 for each bout of standing. Cues for improved upright posture and reduced use of UE support as tolerated. No s/s or orthostasis. Knee extension x 5 on second bout of standing in standing frame with sling.   BLE therex : LAQ, hip flexion, hip abduction, hip flexion, ankle PF, ankle DF. All completed x 10 BLE except ankle DF/PF x 20 each. AAROM on the R to achieve full ROM. Moderate cues for improved eccentric control for BLE.   WC mobility x 1565fwith supervision assist from PT with min cues for doorway management and improved use of RUE to prevent hitting obstacles on the R.   Patient returned to room and [facility] reach and all needs met.     Session 2.   Pt received supine in bed  and agreeable to PT. PT obtained Power WC to demonstrate for pt benefits of power seating option compared to manual WC for d/c. PT questions pt on ability to afford power seating option with medicaid pending, but states that he is unconcerned of cost. Demonstrated proper pressure relief in power chair with explination for difficulties to achieve in standard WC.  Pt performed sit<>supine x 2 on the R with supevision assist into supine and min assist to return to supine with assist at the RLE. Supine BLE therex. SLR with AAROM in BLE, hip flexion/extension x 12 BLE with manual resistance. Partial bridge x 8 with stabilization to the RLE, lumbar rotation x 10 BLE. Pt reports incontinent bowel movement with rotation. Rolling R and L x 4 each for Pt to manage Lower body clothing and perform pericare. Pt returned to supine and left with RW present in room.        Therapy Documentation Precautions:  Precautions Precautions: Fall Precaution Comments: monitor BP, hypotensive in sitting/standing Required Braces or Orthoses: Other Brace Other Brace: RLE Bledsoe brace locked in extension when ambulating that can be d/c'd when pt can perform 10 SLRs without assist Restrictions Weight Bearing Restrictions: No RLE Weight Bearing: Weight bearing as tolerated LLE Weight Bearing: Weight bearing as tolerated    Pain: Pain Assessment Pain Scale: 0-10 Pain Score:  0-No pain   Therapy/Group: Individual Therapy  Lorie Phenix 12/11/2018, 10:01 AM

## 2018-12-11 NOTE — Progress Notes (Signed)
Occupational Therapy Session Note  Patient Details  Name: Aaron [name] MRN: [id number] Date of Birth: 02/10/1966  Today's Date: 12/11/2018 OT Individual Time: 1100-1155 OT Individual Time Calculation (min): 55 min    Short Term Goals: Week 3:  OT Short Term Goal 1 (Week 3): STGs=LTGs secondary to upcoming discharge  Skilled Therapeutic Interventions/Progress Updates:    Pt resting in w/c upon arrival and agreeable to therapy.  Pt c/o his bottom getting sore/tired.  Pt up in w/c since approx 0915.  Pt requested use of maximove to return to bed 2/2 fatigue from sitting in w/c.  Pt able to perform limited lateral weight shifts in w/c to facilitate placement of sling.  Pt able to direct care during tranfser to bed.  Pt required min/mod A for repositioning in bed.  Pt independently demonstrated HEP with green theraband and 4# bar. Pt SOB after 10 reps after each of 6 exercises.  Pt completed 3 sets of each exercise. Pt remained in bed with all needs within reach.   Therapy Documentation Precautions:  Precautions Precautions: Fall Precaution Comments: monitor BP, hypotensive in sitting/standing Required Braces or Orthoses: Other Brace Other Brace: RLE Bledsoe brace locked in extension when ambulating that can be d/c'd when pt can perform 10 SLRs without assist Restrictions Weight Bearing Restrictions: No RLE Weight Bearing: Weight bearing as tolerated LLE Weight Bearing: Weight bearing as tolerated   Pain:  Pt c/o his "bottom getting sore"; repositioned   Therapy/Group: Individual Therapy  Rich Brave 12/11/2018, 12:13 PM

## 2018-12-11 NOTE — Progress Notes (Signed)
[place] PHYSICAL MEDICINE & REHABILITATION PROGRESS NOTE   Subjective/Complaints:  No issues overnite  ROS: Patient denies , nausea, vomiting, diarrhea, cough, shortness of breath or chest pain, Objective:   No results found. No results for input(s): WBC, HGB, HCT, PLT in the last 72 hours. No results for input(s): NA, K, CL, CO2, GLUCOSE, BUN, CREATININE, CALCIUM in the last 72 hours.  Intake/Output Summary (Last 24 hours) at 12/11/2018 0929 Last data filed at 12/11/2018 0724 Gross per 24 hour  Intake 476 ml  Output 1200 ml  Net -724 ml     Physical Exam: Vital Signs Blood pressure 121/83, pulse 79, temperature 98 F (36.7 C), resp. rate 18, height 6' 0.4" (1.839 m), weight 119.7 kg, SpO2 92 %. Constitutional: No distress . Vital signs reviewed. HEENT: EOMI, oral membranes moist Neck: supple Cardiovascular: RRR without murmur. No JVD    Respiratory: CTA Bilaterally without wheezes or rales. Normal effort, [place]   GI: BS +, non-tender, non-distended  Musc: Edema in bilateral feet Neurologic: Alert Motor: Bilateral upper extremities: 4/5 proximal distal Bilateral lower extremities:  Right lower extremity: Hip flexion 2/5, knee extension 2+/5, ankle dorsiflexion 3/5, stable Left lower extremity: Hip flexion 3-/5, knee extension 3/5, ankle dorsiflexion 4/5, stable Skin: Lower extremities and Unna boots Sensation reduced right thigh, anterior medial and lateral aspect including the right prepatellar area--stable Psych: pleasant  Assessment/Plan: 1. Functional deficits secondary to debility secondary to CHF and empyema which require 3+ hours per day of interdisciplinary therapy in a comprehensive inpatient rehab setting.  Physiatrist is providing close team supervision and 24 hour management of active medical problems listed below.  Physiatrist and rehab team continue to assess barriers to discharge/monitor patient progress toward functional and medical goals  Care  Tool:  Bathing  Bathing activity did not occur: Refused Body parts bathed by patient: Right arm, Left arm, Chest, Abdomen, Face   Body parts bathed by helper: Right lower leg, Left lower leg Body parts n/a: Front perineal area, Buttocks   Bathing assist Assist Level: Minimal Assistance - Patient > 75%     Upper Body Dressing/Undressing Upper body dressing   What is the patient wearing?: Pull over shirt    Upper body assist Assist Level: Set up assist    Lower Body Dressing/Undressing Lower body dressing      What is the patient wearing?: Pants     Lower body assist Assist for lower body dressing: Maximal Assistance - Patient 25 - 49%     Toileting Toileting    Toileting assist Assist for toileting: Total Assistance - Patient < 25%     Transfers Chair/bed transfer  Transfers assist  Chair/bed transfer activity did not occur: Safety/medical concerns  Chair/bed transfer assist level: Dependent - mechanical lift     Locomotion Ambulation   Ambulation assist   Ambulation activity did not occur: Safety/medical concerns          Walk 10 feet activity   Assist  Walk 10 feet activity did not occur: Safety/medical concerns        Walk 50 feet activity   Assist Walk 50 feet with 2 turns activity did not occur: Safety/medical concerns         Walk 150 feet activity   Assist Walk 150 feet activity did not occur: Safety/medical concerns         Walk 10 feet on uneven surface  activity   Assist Walk 10 feet on uneven surfaces activity did not occur: Safety/medical concerns  Wheelchair     Assist Will patient use wheelchair at discharge?: Yes Type of Wheelchair: Manual Wheelchair activity did not occur: Safety/medical concerns  Wheelchair assist level: Supervision/Verbal cueing Max wheelchair distance: 220    Wheelchair 50 feet with 2 turns activity    Assist    Wheelchair 50 feet with 2 turns activity did not  occur: Safety/medical concerns   Assist Level: Supervision/Verbal cueing   Wheelchair 150 feet activity     Assist Wheelchair 150 feet activity did not occur: Safety/medical concerns   Assist Level: Supervision/Verbal cueing    Medical Problem List and Plan: 1.  Deficits with mobility, transfers, endurance, weakness secondary to multifactorial debility.  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  Antithrombotics: -DVT/anticoagulation:  Pharmaceutical: Other (comment)--Eliquis. Continue to monitor hemoglobin as well as signs of bleeding.             -antiplatelet therapy: N/A 3. Pain Management: Tylenol prn 4. Mood: LCSW to follow for evaluate              -antipsychotic agents: N/A 5. Neuropsych: This patient is capable of making decisions on his own behalf. 6. Skin/Wound Care: Routine pressure relief measures.  7. Fluids/Electrolytes/Nutrition: Monitor I/O. Continue ensure bid. I: , O:  Increasing BUN/creatinine ratio, encourage fluids, appreciate IM and cardiology consultation  -some improvement from 5/7- 5/8--, on 511 BUN was 27 with normal creatinine 8. Empyema due to E coli: Continue ampicillin --Antibiotic day #25/28.  CT chest prior to d/c antibiotics? 9. ABLA due to GIB/spontaneous thigh hematoma: Continue protonix bid. Received 1 unit PRBC 4/24 and feraheme 4/26.   Now on iron supplement.  Added folic acid due to low folate levels.    Hemoglobin 7.2 on 4/30, hemoglobin 7.6 on 5/7, now up to 8.2 on 5/11, no need for transfusion   10. ICM/Acute systolic CHF: Monitor daily weights.  Monitor for signs/symptoms of fluid overload.  BP remains soft--no ARB. Continue coreg and Crestor.  Filed Weights   12/08/18 0427 12/09/18 0241 12/10/18 0606  Weight: 122.5 kg 120.2 kg 119.7 kg   Fluctuating weight Technique,  LE no edema s/p Unna boot removal  -continue to monitor  -oxygen at night, no CPAP right now 11. 3 vessel CAD: Not a surgical candidate-treated medically 12. Paroxymal A Fib with RVR: Monitor HR bid-continue amiodarone, coreg and Eliquis. Cards recommends re-eval prior to discharge.  13. Bradycardia/tachy: rate controlled at present    Coreg 3.125  twice daily, still is off digoxin. amio 200mg  bid     -cardiology has seen pt 14. Protein calorie malnutrition: Continue to offer supplements tid. Magnesium supplemented yesterday due to low normal levels.  Added Mg supplement..  15. Orthostatic hypotension:Improved  .   -Thigh high TEDs.  Abdominal binder if needed, encourage fluids   tolerates therapy   LOS: 16 days A FACE TO FACE EVALUATION WAS PERFORMED  Erick Colace 12/11/2018, 9:29 AM

## 2018-12-12 NOTE — Progress Notes (Signed)
Unable to weigh patient this morning bed scale not working.Charge nurse Jamie  aware.Will report off to oncoming shift.

## 2018-12-12 NOTE — Progress Notes (Signed)
Aaron [name] is a 53 y.o. male admitted for CIR with debility secondary to congestive heart failure and empyema  Past Medical History:  Diagnosis Date  . Acute systolic (congestive) heart failure (HCC) 10/2018  . Atrial fibrillation, new onset (HCC) 10/2018  . Coronary artery disease   . Morbid obesity (HCC)      Subjective: No new complaints. No new problems. Slept well. Feeling OK.  Continues to receive parenteral antibiotics  Objective: Vital signs in last 24 hours: Temp:  [97.6 F (36.4 C)-98.8 F (37.1 C)] 97.6 F (36.4 C) (05/16 0422) Pulse Rate:  [72-76] 72 (05/16 0426) Resp:  [18-21] 18 (05/16 0422) BP: (113-119)/(64-83) 117/77 (05/16 0426) SpO2:  [94 %-100 %] 94 % (05/16 0426) Weight change:  Last BM Date: 12/11/18  Intake/Output from previous day: 05/15 0701 - 05/16 0700 In: 593 [P.O.:593] Out: 1075 [Urine:1075]  Patient Vitals for the past 24 hrs:  BP Temp Temp src Pulse Resp SpO2  12/12/18 0426 117/77 - - 72 - 94 %  12/12/18 0422 113/83 97.6 F (36.4 C) Oral 72 18 95 %  12/11/18 1946 119/75 98.8 F (37.1 C) Oral 74 18 96 %  12/11/18 1734 118/80 - - 76 - 100 %  12/11/18 1320 114/64 97.7 F (36.5 C) Oral 76 (!) 21 98 %    Physical Exam General: No apparent distress   HEENT: not dry Lungs: Normal effort. Lungs clear to auscultation, no crackles or wheezes. Cardiovascular: Regular rate and rhythm, no edema Abdomen: S/NT/ND; BS(+) Musculoskeletal:  unchanged Neurological: No new neurological deficits with lower extremity weakness Extremities.  Trace edema Skin: clear IV infusing right arm Mental state: Alert, oriented, cooperative    Lab Results: BMET    Component Value Date/Time   NA 139 12/07/2018 0405   K 3.7 12/07/2018 0405   CL 105 12/07/2018 0405   CO2 26 12/07/2018 0405   GLUCOSE 92 12/07/2018 0405   BUN 27 (H) 12/07/2018 0405   CREATININE 0.87 12/07/2018 0405   CALCIUM 8.3 (L) 12/07/2018 0405   GFRNONAA >60 12/07/2018 0405   GFRAA  >60 12/07/2018 0405   CBC    Component Value Date/Time   WBC 7.7 12/07/2018 0405   RBC 2.69 (L) 12/07/2018 0405   HGB 8.2 (L) 12/07/2018 0405   HCT 27.3 (L) 12/07/2018 0405   PLT 242 12/07/2018 0405   MCV 101.5 (H) 12/07/2018 0405   MCH 30.5 12/07/2018 0405   MCHC 30.0 12/07/2018 0405   RDW 23.1 (H) 12/07/2018 0405   LYMPHSABS 0.2 (L) 11/30/2018 1557   MONOABS 0.8 11/30/2018 1557   EOSABS 0.2 11/30/2018 1557   BASOSABS 0.0 11/30/2018 1557    Medications: I have reviewed the patient's current medications.  Assessment/Plan:  Functional deficits with mobility and endurance secondary to complex medical illness DVT prophylaxis continue Eliquis E. coli empyema.  Continue parenteral antibiotics to complete 28 days Ischemic cardiomyopathy.  Continue to monitor for fluid overload    Length of stay, days: 17  Gordy Savers , MD 12/12/2018, 10:32 AM

## 2018-12-13 ENCOUNTER — Inpatient Hospital Stay (HOSPITAL_COMMUNITY): Payer: Self-pay | Admitting: Occupational Therapy

## 2018-12-13 NOTE — Progress Notes (Signed)
Aaron [name] is a 53 y.o. male admitted for CIR with general debility secondary to congestive heart failure empyema and other complex medical illness  Past Medical History:  Diagnosis Date  . Acute systolic (congestive) heart failure (HCC) 10/2018  . Atrial fibrillation, new onset (HCC) 10/2018  . Coronary artery disease   . Morbid obesity (HCC)      Subjective: No new complaints. No new problems. Slept well. Feeling OK.  Objective: Vital signs in last 24 hours: Temp:  [97.5 F (36.4 C)-97.9 F (36.6 C)] 97.5 F (36.4 C) (05/17 0431) Pulse Rate:  [69-82] 82 (05/17 0431) Resp:  [16-19] 18 (05/17 0431) BP: (111-121)/(73-88) 121/88 (05/17 0431) SpO2:  [85 %-92 %] 92 % (05/17 0431) Weight change:  Last BM Date: 12/12/18  Intake/Output from previous day: 05/16 0701 - 05/17 0700 In: 460 [P.O.:360; IV Piggyback:100] Out: 1825 [Urine:1825]  Patient Vitals for the past 24 hrs:  BP Temp Temp src Pulse Resp SpO2 Weight  12/13/18 0431 121/88 (!) 97.5 F (36.4 C) Oral 82 18 92 % -  12/12/18 1909 111/73 (!) 97.5 F (36.4 C) Oral 74 16 90 % -  12/12/18 1412 121/77 97.9 F (36.6 C) Oral 69 19 (!) 85 % -     Physical Exam General: No apparent distress   HEENT: not dry Lungs: Normal effort. Lungs clear to auscultation, no crackles or wheezes. Cardiovascular: Regular rate and rhythm, no edema.  Rhythm remains regular Abdomen: S/NT/ND; BS(+) Musculoskeletal:  unchanged Neurological: No new neurological deficits with lower extremity weakness Extremities.  Trace edema Skin: clear IV infusing left arm Mental state: Alert, oriented, cooperative    Lab Results: BMET    Component Value Date/Time   NA 139 12/07/2018 0405   K 3.7 12/07/2018 0405   CL 105 12/07/2018 0405   CO2 26 12/07/2018 0405   GLUCOSE 92 12/07/2018 0405   BUN 27 (H) 12/07/2018 0405   CREATININE 0.87 12/07/2018 0405   CALCIUM 8.3 (L) 12/07/2018 0405   GFRNONAA >60 12/07/2018 0405   GFRAA >60 12/07/2018  0405   CBC    Component Value Date/Time   WBC 7.7 12/07/2018 0405   RBC 2.69 (L) 12/07/2018 0405   HGB 8.2 (L) 12/07/2018 0405   HCT 27.3 (L) 12/07/2018 0405   PLT 242 12/07/2018 0405   MCV 101.5 (H) 12/07/2018 0405   MCH 30.5 12/07/2018 0405   MCHC 30.0 12/07/2018 0405   RDW 23.1 (H) 12/07/2018 0405   LYMPHSABS 0.2 (L) 11/30/2018 1557   MONOABS 0.8 11/30/2018 1557   EOSABS 0.2 11/30/2018 1557   BASOSABS 0.0 11/30/2018 1557    Medications: I have reviewed the patient's current medications.  Assessment/Plan:  Functional deficits in mobility and endurance secondary to complex medical illness History of E. coli empyema.  Continue parenteral antibiotics for total of 28 days Ischemic cardiomyopathy DVT prophylaxis continue Eliquis    Length of stay, days: 18  Gordy Savers , MD 12/13/2018, 10:23 AM

## 2018-12-13 NOTE — Progress Notes (Signed)
Occupational Therapy Session Note  Patient Details  Name: Aaron [name] MRN: [id number] Date of Birth: 20-Sep-1965  Today's Date: 12/13/2018 OT Individual Time: 1100-1200 OT Individual Time Calculation (min): 60 min    Short Term Goals: Week 3:  OT Short Term Goal 1 (Week 3): STGs=LTGs secondary to upcoming discharge  Skilled Therapeutic Interventions/Progress Updates:    Patient in bed upon arrival and happy to participate in therapy program.  LB dressing max A/dep, supine to SSP min A, SB transfer CGA/min A , assist to place board and set up w/c.  Conditioning activities to include ergometer 2 x 4-5 minutes, kinetron 4 x 2 minutes, trunk/core exercises at w/c level.  Patient returned to room, remained seated in w/c with call bell and tray table in reach.    Therapy Documentation Precautions:  Precautions Precautions: Fall Precaution Comments: monitor BP, hypotensive in sitting/standing Required Braces or Orthoses: Other Brace Other Brace: RLE Bledsoe brace locked in extension when ambulating that can be d/c'd when pt can perform 10 SLRs without assist Restrictions Weight Bearing Restrictions: No RLE Weight Bearing: Weight bearing as tolerated LLE Weight Bearing: Weight bearing as tolerated General:   Vital Signs: Therapy Vitals Temp: 97.8 F (36.6 C) Temp Source: Oral Pulse Rate: 77 Resp: 18 BP: 114/69 Patient Position (if appropriate): Lying Oxygen Therapy SpO2: 97 % O2 Device: Room Air Pain: Pain Assessment Pain Scale: 0-10 Pain Score: 0-No pain Other Treatments:     Therapy/Group: Individual Therapy  Barrie Lyme 12/13/2018, 3:48 PM

## 2018-12-14 ENCOUNTER — Inpatient Hospital Stay (HOSPITAL_COMMUNITY): Payer: Self-pay | Admitting: Occupational Therapy

## 2018-12-14 ENCOUNTER — Inpatient Hospital Stay (HOSPITAL_COMMUNITY): Payer: Self-pay

## 2018-12-14 MED ORDER — ENSURE ENLIVE PO LIQD
237.0000 mL | Freq: Three times a day (TID) | ORAL | Status: DC
  Administered 2018-12-14 – 2018-12-17 (×10): 237 mL via ORAL

## 2018-12-14 NOTE — Progress Notes (Signed)
Physical Therapy Session Note  Patient Details  Name: Aaron [name] MRN: [id number] Date of Birth: 01-20-1966  Today's Date: 12/14/2018 PT Individual Time: 0920-1000 PT Individual Time Calculation (min): 40 min   Short Term Goals: Week 3:  PT Short Term Goal 1 (Week 3): Pt will perform bed mobility with supervision assist  PT Short Term Goal 2 (Week 3): Pt will perform SB transfer to and from [facility] with CGA  PT Short Term Goal 3 (Week 3): Pt will perform sit<>stand at rail or in parallel bars with max assist  PT Short Term Goal 4 (Week 3): Pt will propell WC 168ft with supervision assist and no more than 1 rest break   Skilled Therapeutic Interventions/Progress Updates:   Education on process of specialty w/c evaluation and loaner trial process. Discussed transport options and home measurements to determine access. Pt reports new home all doorways are 36" and there will be a ramp. Current home there is accessible for level entry access to den area where patient can stay short term until move to new home occurs. Pt seen for specialty seating w/c evaluation with [employer]) for recommendation of power w/c with tilt feature, specialty back and pressure relieving and skin protection cushion (formal seating evaluation completed and submitted) for increased functional independence with mobility and pressure relief in home and community environments. Seated for group 3 power w/c (due to size) quantum edge 3, sport back, and true comfort 2 cushion.   Therapy Documentation Precautions:  Precautions Precautions: Fall Precaution Comments: monitor BP, hypotensive in sitting/standing Required Braces or Orthoses: Other Brace Other Brace: RLE Bledsoe brace locked in extension when ambulating that can be d/c'd when pt can perform 10 SLRs without assist Restrictions Weight Bearing Restrictions: No RLE Weight Bearing: Weight bearing as tolerated LLE Weight Bearing: Weight bearing as  tolerated  Pain: Pain Assessment Pain Scale: 0-10 Pain Score: 0-No pain Faces Pain Scale: No hurt    Therapy/Group: Individual Therapy  Karolee Stamps Darrol Poke, PT, DPT, CBIS  12/14/2018, 10:15 AM

## 2018-12-14 NOTE — Progress Notes (Signed)
Occupational Therapy Session Note  Patient Details  Name: Aaron [name] MRN: [id number] Date of Birth: [birth year]  Today's Date: 12/14/2018 OT Individual Time: 5638-7564 OT Individual Time Calculation (min): 58 min    Short Term Goals: Week 3:  OT Short Term Goal 1 (Week 3): STGs=LTGs secondary to upcoming discharge  Skilled Therapeutic Interventions/Progress Updates:    Upon entering the room, pt supine in bed with no c/o pain and agreeable to OT intervention. OT and pt discussing change of location for discharge and pt going to "old" house. Pt will need hospital bed if this is the set up. Focus on toilet transfer and lateral leans for clothing management. Pt needing mod cuing to verbally direct transfer this session. Min A and min cuing for technique from bed > drop arm commode chair. Pt leaning L <> R with min A and attempting to pull theraband from waist down hips to simulate pants. Unable to fully pull down without assistance. Pt verbalized, " At home i'm not going to do it like this . I'm just going to stand." Stedy placed in front of pt to demonstrate that he could not stand from toilet. +2 assistance and pt still unable to standing into STEDY from elevated toilet height. Pt returning back to bed at end of session with Mod +2 assistance with slide board secondary to fatigue. Call bell and all needed items within reach upon exiting the room.   Therapy Documentation Precautions:  Precautions Precautions: Fall Precaution Comments: monitor BP, hypotensive in sitting/standing Required Braces or Orthoses: Other Brace Other Brace: RLE Bledsoe brace locked in extension when ambulating that can be d/c'd when pt can perform 10 SLRs without assist Restrictions Weight Bearing Restrictions: No RLE Weight Bearing: Weight bearing as tolerated LLE Weight Bearing: Weight bearing as tolerated General:   Vital Signs: Therapy Vitals Temp: 97.7 F (36.5 C) Pulse Rate: 79 Resp: 20 BP:  112/72 Patient Position (if appropriate): Lying Oxygen Therapy SpO2: 97 % O2 Device: Room Air   Therapy/Group: Individual Therapy  Alen Bleacher 12/14/2018, 4:10 PM

## 2018-12-14 NOTE — Plan of Care (Signed)
  Problem: RH BOWEL ELIMINATION Goal: RH STG MANAGE BOWEL WITH ASSISTANCE Description STG Manage Bowel with Assistance. Mod  Outcome: Progressing   Problem: RH BLADDER ELIMINATION Goal: RH STG MANAGE BLADDER WITH ASSISTANCE Description STG Manage Bladder With Assistance. Min  Outcome: Progressing   Problem: RH SKIN INTEGRITY Goal: RH STG SKIN FREE OF INFECTION/BREAKDOWN Description Free of skin breakdown, infection with mod assist  Outcome: Progressing   Problem: RH PAIN MANAGEMENT Goal: RH STG PAIN MANAGED AT OR BELOW PT'S PAIN GOAL Description No pain or less than 3  Outcome: Progressing   

## 2018-12-14 NOTE — Progress Notes (Signed)
Physical Therapy Session Note  Patient Details  Name: Aaron [name] MRN: [id number] Date of Birth: 12-14-1965  Today's Date: 12/14/2018 PT Individual Time: 1000-1025 PT Individual Time Calculation (min): 25 min   Short Term Goals: Week 3:  PT Short Term Goal 1 (Week 3): Pt will perform bed mobility with supervision assist  PT Short Term Goal 2 (Week 3): Pt will perform SB transfer to and from [facility] with CGA  PT Short Term Goal 3 (Week 3): Pt will perform sit<>stand at rail or in parallel bars with max assist  PT Short Term Goal 4 (Week 3): Pt will propell WC 110f with supervision assist and no more than 1 rest break   Skilled Therapeutic Interventions/Progress Updates: Pt in rehab gym completing session from w/c eval. Pt participated in seated therex from w/c including:  AALAQ x10 RLE LAQ x 10 LLE Seated hip flexion x 10 B Hip ER x 10 B Hamstring pulls L2 resistance band x 2 B Towel adduction squeeze x 10 Biceps curls x 15 1lb dowel Scapular retraction x 10 B Shoulder flexion to 90 1lb dowel x 10 Arm circles 1lb dowel CW/CCW x 10 Rotations (churning) 1lb dowel CW/CCW  Pt transported back to room at end of session and left in w/c with call bell within reach and needs met.        Therapy Documentation Precautions:  Precautions Precautions: Fall Precaution Comments: monitor BP, hypotensive in sitting/standing Required Braces or Orthoses: Other Brace Other Brace: RLE Bledsoe brace locked in extension when ambulating that can be d/c'd when pt can perform 10 SLRs without assist Restrictions Weight Bearing Restrictions: No RLE Weight Bearing: Weight bearing as tolerated LLE Weight Bearing: Weight bearing as tolerated General:   Vital Signs:  Pain: Pain Assessment Pain Scale: 0-10 Pain Score: 0-No pain Faces Pain Scale: No hurt   Therapy/Group: Individual Therapy  Torri Michalski  Cordaryl Decelles, PTA  12/14/2018, 12:24 PM

## 2018-12-14 NOTE — Progress Notes (Signed)
Occupational Therapy Session Note  Patient Details  Name: Aaron [name] MRN: [id number] Date of Birth: 10-01-1965  Today's Date: 12/14/2018 OT Individual Time: 0700-0800 OT Individual Time Calculation (min): 60 min    Short Term Goals: Week 3:  OT Short Term Goal 1 (Week 3): STGs=LTGs secondary to upcoming discharge  Skilled Therapeutic Interventions/Progress Updates:    Pt resting in bed upon arrival.  OT intervention with focus on bed mobility, LB dressing at bed level, sitting balance, slide board transfers, self care w/c level at sink, w/c mobility, BUE therex, activity tolerance, and safety awareness to increase independence with BADLs. Pt completed LB dressing at bed level with max A.  Pt able to roll in bed without assistance using bed rails (pt will have hospital bed at home). Pt able to assist with pulling pants up after threaded. Pt sat EOB with supervision and performed slide board transfer to w/c with supervision after board placed.  Pt completed self care at sink in w/c.  Pt propelled w/c approx 100'.  Pt engaged in BUE therex on [employer] ergometer (level 3 for 8 mins). Pt returned to room and remained in w/c with NT and RN present.   Therapy Documentation Precautions:  Precautions Precautions: Fall Precaution Comments: monitor BP, hypotensive in sitting/standing Required Braces or Orthoses: Other Brace Other Brace: RLE Bledsoe brace locked in extension when ambulating that can be d/c'd when pt can perform 10 SLRs without assist Restrictions Weight Bearing Restrictions: No RLE Weight Bearing: Weight bearing as tolerated LLE Weight Bearing: Weight bearing as tolerated Pain:  Pt denies pain this morning   Therapy/Group: Individual Therapy  Rich Brave 12/14/2018, 8:01 AM

## 2018-12-14 NOTE — Progress Notes (Signed)
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 No results for input(s): WBC, HGB, HCT, PLT in the last 72 hours. No results for input(s): NA, K, CL, CO2, GLUCOSE, BUN, CREATININE, CALCIUM in the last 72 hours.  Intake/Output Summary (Last 24 hours) at 12/14/2018 0902 Last data filed at 12/14/2018 5573 Gross per 24 hour  Intake 1134.03 ml  Output 750 ml  Net 384.03 ml     Physical Exam: Vital Signs Blood pressure (!) 122/91, pulse 83, temperature 97.6 F (36.4 C), temperature source Oral, resp. rate 16, height 6' 0.4" (1.839 m), weight 119.7 kg, SpO2 95 %. Constitutional: No distress . Vital signs reviewed. HEENT: EOMI, oral membranes moist Neck: supple Cardiovascular: RRR without murmur. No JVD    Respiratory: CTA Bilaterally without wheezes or rales. Normal effort, [place]   GI: BS +, non-tender, non-distended  Musc: Edema in bilateral feet Neurologic: Alert Motor: Bilateral upper extremities: 4/5 proximal distal Bilateral lower extremities:  Right lower extremity: Hip flexion 2/5, knee extension 2+/5, ankle dorsiflexion 3/5, stable Left lower extremity: Hip flexion 3-/5, knee extension 3/5, ankle dorsiflexion 4/5, stable Skin: Lower extremities and Unna boots Sensation reduced right thigh, anterior medial and lateral aspect including the right prepatellar area--stable Psych: pleasant  Assessment/Plan: 1. Functional deficits secondary to debility secondary to CHF and empyema which require 3+ hours per day of interdisciplinary therapy in a comprehensive inpatient rehab setting.  Physiatrist is providing close team supervision and 24 hour management of active medical problems listed below.  Physiatrist and rehab team continue to assess barriers to  discharge/monitor patient progress toward functional and medical goals  Care Tool:  Bathing  Bathing activity did not occur: Refused Body parts bathed by patient: Right arm, Left arm, Chest, Abdomen, Face   Body parts bathed by helper: Right lower leg, Left lower leg Body parts n/a: Front perineal area, Buttocks   Bathing assist Assist Level: Minimal Assistance - Patient > 75%     Upper Body Dressing/Undressing Upper body dressing   What is the patient wearing?: Pull over shirt    Upper body assist Assist Level: Set up assist    Lower Body Dressing/Undressing Lower body dressing      What is the patient wearing?: Pants     Lower body assist Assist for lower body dressing: Maximal Assistance - Patient 25 - 49%     Toileting Toileting    Toileting assist Assist for toileting: Total Assistance - Patient < 25%     Transfers Chair/bed transfer  Transfers assist  Chair/bed transfer activity did not occur: Safety/medical concerns  Chair/bed transfer assist level: Minimal Assistance - Patient > 75%     Locomotion Ambulation   Ambulation assist   Ambulation activity did not occur: Safety/medical concerns          Walk 10 feet activity   Assist  Walk 10 feet activity did not occur: Safety/medical concerns        Walk 50 feet activity   Assist Walk 50 feet with 2 turns activity did not occur: Safety/medical concerns         Walk 150 feet activity   Assist Walk 150 feet activity did not occur: Safety/medical concerns         Walk 10 feet on uneven surface  activity   Assist Walk 10 feet on uneven surfaces activity did not occur: Safety/medical concerns         Wheelchair     Assist Will patient use wheelchair at discharge?: Yes Type of Wheelchair: Manual Wheelchair activity did not occur: Safety/medical concerns  Wheelchair assist level: Supervision/Verbal cueing Max wheelchair distance: 150    Wheelchair 50 feet with 2  turns activity    Assist    Wheelchair 50 feet with 2 turns activity did not occur: Safety/medical concerns   Assist Level: Supervision/Verbal cueing   Wheelchair 150 feet activity     Assist Wheelchair 150 feet activity did not occur: Safety/medical concerns   Assist Level: Supervision/Verbal cueing    Medical Problem List and Plan: 1.  Deficits with mobility, transfers, endurance, weakness secondary to multifactorial debility.  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  Antithrombotics: -DVT/anticoagulation:  Pharmaceutical: Other (comment)--Eliquis. Continue to monitor hemoglobin as well as signs of bleeding.             -antiplatelet therapy: N/A 3. Pain Management: Tylenol prn 4. Mood: LCSW to follow for evaluate              -antipsychotic agents: N/A 5. Neuropsych: This patient is capable of making decisions on his own behalf. 6. Skin/Wound Care: Routine pressure relief measures.  7. Fluids/Electrolytes/Nutrition: Monitor I/O. Continue ensure bid. I: , O:  Increasing BUN/creatinine ratio, encourage fluids, appreciate IM and cardiology consultation  -some improvement from 5/7- 5/8--, on 511 BUN was 27 with normal creatinine 8. Empyema due to E coli: Continue ampicillin --Antibiotic day #27/28.  CT chest prior to d/c antibiotics? 9. ABLA due to GIB/spontaneous thigh hematoma: Continue protonix bid. Received 1 unit PRBC 4/24 and feraheme 4/26.   Now on iron supplement.  Added folic acid due to low folate levels.    Hemoglobin 7.2 on 4/30, hemoglobin 7.6 on 5/7, now up to 8.2 on 5/11, no need for transfusion   10. ICM/Acute systolic CHF: Monitor daily weights.  Monitor for signs/symptoms of fluid overload.  BP remains soft--no ARB. Continue coreg and Crestor.   Filed Weights   12/10/18 0606  Weight: 119.7 kg   Fluctuating weight Technique, LE no edema s/p Unna boot removal  -continue to monitor  -oxygen at night, no CPAP right now 11. 3 vessel CAD: Not a surgical candidate-treated medically 12. Paroxymal A Fib with RVR: Monitor HR bid-continue amiodarone, coreg and Eliquis. Cards recommends re-eval prior to discharge.  13. Bradycardia/tachy: rate controlled at present    Coreg 3.125  twice daily, still is off digoxin. amio 200mg  bid     -cardiology has seen pt 14. Protein calorie malnutrition: Continue to offer supplements tid. Magnesium supplemented yesterday due to low normal levels.  Added Mg supplement..  15. Orthostatic hypotension:Improved  .   -Thigh high TEDs.  Abdominal binder if needed, encourage fluids   tolerates therapy   LOS: 19 days A FACE TO FACE EVALUATION WAS PERFORMED  Erick Colace 12/14/2018, 9:02 AM

## 2018-12-14 NOTE — Progress Notes (Signed)
Nutrition Follow-up  DOCUMENTATION CODES:   Obesity unspecified, Non-severe (moderate) malnutrition in context of chronic illness - new diagnosis after completion of NFPE  INTERVENTION:  - Increase Ensure Enlive po to TID, each supplement provides 350 kcal and 20 grams of protein  -ContinuePro-stat 30 ml BID, each supplement provides 100 kcal and 15 grams of protein  - ContinueMagicCupBID with mealsdue to variable PO intake at meal times, each supplement provides 290 kcal and 9 grams of protein  NUTRITION DIAGNOSIS:   Moderate Malnutrition related to chronic illness (CHF) as evidenced by mild fat depletion, mild muscle depletion, moderate muscle depletion.  New diagnosis after completion of NFPE  GOAL:   Patient will meet greater than or equal to 90% of their needs  Progressing  MONITOR:   PO intake, Supplement acceptance, Weight trends, I & O's, Labs, Skin  REASON FOR ASSESSMENT:   Malnutrition Screening Tool    ASSESSMENT:   53 year old male without significant medical history who was originally admitted on 10/01/18 with 3-4 weeks history of progressive edema and SOB. Pt  was found to have fluid overload with anasarca, ascites, AKI, and abnormal LFTs. 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 Hospital course complicated by A-fib with RVR but patient developed spontaneous right thigh hematoma 3/18 as well as GIB due to esophageal ulcers with gastritis and esophagitis on 3/22. Pt also had issue with abdominal pain due to ileus treated with NGT. Pt was admitted to CIR 4/9 due to debility. On 4/16, pt reported productive cough as well as increase in SOB. CT chest ordered for work up and revealed right sided empyema with complete collapse of RLL and partial collapse of RML/RUL with reactive mediastinal lymph nodes. CT surgery and TRH were consulted for assistance and Pt was discharged to acute hospital for monitoring and treatment. Dr. Dorris Fetch recommended VIR chest tube  placement. Pt underwent drainage of 800 cc thick brown purulent appearing fluid on 4/16 with placement of PleurEvac and continued to have drainage. Chest tube removed on 4/27. Pt admitted back to CIR on 4/29.  Noted pt's discharge date of 5/22.  No new weight since 5/14. Pt's weight has been fairly stable since admission.  Spoke with pt at bedside. Pt looking forward to discharge on Friday. Discussed poor po intake with pt. Pt reports that he has a good appetite and eats what he wants. Pt states he has never been "a big eater." Noted 10% completed lunch meal tray at bedside. RD encouraged pt to consume more at mealtimes with a focus on protein-rich foods to aid in maintaining lean muscle mass. Pt states that he thinks he will eat more when he gets home.  Pt endorses drinking the Ensure Enlive and states that they have been essential especially when he wasn't eating. Pt also confirms taking Pro-stat.  Pt reports his UBW as 230 lbs. Last weight on 5/14 was 264 lbs.  After completion of NFPE and findings of fat and muscle depletions, pt meets criteria for malnutrition.  Meal Completion: 0-50% x last 7 recorded meals  Medications reviewed and include: Ensure Enlive BID, Pro-stat 30 ml BID, ferrous sulfate, folic acid, Lasix 20 mg daily, magnesium oxide, Protonix, Miralax  Labs reviewed: hemoglobin 8.2 (L)  UOP: 850 ml x 24 hours I/O's: -2.2 L since admit  NUTRITION - FOCUSED PHYSICAL EXAM:    Most Recent Value  Orbital Region  Mild depletion  Upper Arm Region  Mild depletion  Thoracic and Lumbar Region  No depletion  Buccal Region  Mild depletion  Temple Region  Moderate depletion  Clavicle Bone Region  Moderate depletion  Clavicle and Acromion Bone Region  Moderate depletion  Scapular Bone Region  Unable to assess  Dorsal Hand  Mild depletion  Patellar Region  Moderate depletion  Anterior Thigh Region  Moderate depletion  Posterior Calf Region  Unable to assess  Edema (RD  Assessment)  Mild [BLE]  Hair  Reviewed  Eyes  Reviewed  Mouth  Reviewed  Skin  Reviewed  Nails  Reviewed       Diet Order:   Diet Order            Diet Heart Room service appropriate? Yes with Assist; Fluid consistency: Thin  Diet effective now              EDUCATION NEEDS:   Education needs have been addressed  Skin:  Skin Assessment: Skin Integrity Issues: Stage II: right nare, right buttocks Incisions: closed incision to chest  Last BM:  12/14/18  Height:   Ht Readings from Last 1 Encounters:  11/25/18 6' 0.4" (1.839 m)    Weight:   Wt Readings from Last 1 Encounters:  11/25/18 119.3 kg    Ideal Body Weight:  80.9 kg  BMI:  Body mass index is 35.41 kg/m.  Estimated Nutritional Needs:   Kcal:  2300-2500  Protein:  115-130 grams  Fluid:  >/= 2.0 L    Earma Reading, MS, RD, LDN Inpatient Clinical Dietitian Pager: [phone] Weekend/After Hours: [phone]

## 2018-12-15 ENCOUNTER — Inpatient Hospital Stay (HOSPITAL_COMMUNITY): Payer: Medicaid Other

## 2018-12-15 ENCOUNTER — Inpatient Hospital Stay (HOSPITAL_COMMUNITY): Payer: Self-pay | Admitting: *Deleted

## 2018-12-15 ENCOUNTER — Inpatient Hospital Stay (HOSPITAL_COMMUNITY): Payer: Self-pay

## 2018-12-15 ENCOUNTER — Inpatient Hospital Stay (HOSPITAL_COMMUNITY): Payer: Self-pay | Admitting: Physical Therapy

## 2018-12-15 NOTE — Progress Notes (Addendum)
[place] PHYSICAL MEDICINE & REHABILITATION PROGRESS NOTE   Subjective/Complaints:  Per OT patient supervision with sliding board transfer this morning.  Family is to come in tomorrow.  Discussion regarding what equipment he has at home from wife's illness.  He will need a hospital bed  ROS: Patient denies , nausea, vomiting, diarrhea, cough, shortness of breath or chest pain, Objective:   No results found. No results for input(s): WBC, HGB, HCT, PLT in the last 72 hours. No results for input(s): NA, K, CL, CO2, GLUCOSE, BUN, CREATININE, CALCIUM in the last 72 hours.  Intake/Output Summary (Last 24 hours) at 12/15/2018 0749 Last data filed at 12/15/2018 0731 Gross per 24 hour  Intake 440 ml  Output 1101 ml  Net -661 ml     Physical Exam: Vital Signs Blood pressure 117/78, pulse 79, temperature 97.7 F (36.5 C), temperature source Oral, resp. rate 18, height 6' 0.4" (1.839 m), weight 119.7 kg, SpO2 96 %. Constitutional: No distress . Vital signs reviewed. HEENT: EOMI, oral membranes moist Neck: supple Cardiovascular: RRR without murmur. No JVD    Respiratory: CTA Bilaterally without wheezes or rales. Normal effort, [place]   GI: BS +, non-tender, non-distended  Musc: Edema in bilateral feet Neurologic: Alert Motor: Bilateral upper extremities: 4/5 proximal distal Bilateral lower extremities:  Right lower extremity: Hip flexion 2/5, knee extension 2+/5, ankle dorsiflexion 3/5, stable Left lower extremity: Hip flexion 3-/5, knee extension 3/5, ankle dorsiflexion 4/5, stable Skin: Lower extremities and Unna boots Sensation reduced right thigh, anterior medial and lateral aspect including the right prepatellar area--stable Psych: pleasant  Assessment/Plan: 1. Functional deficits secondary to debility secondary to CHF and empyema which require 3+ hours per day of interdisciplinary therapy in a comprehensive inpatient rehab setting.  Physiatrist is providing close team supervision  and 24 hour management of active medical problems listed below.  Physiatrist and rehab team continue to assess barriers to discharge/monitor patient progress toward functional and medical goals  Care Tool:  Bathing  Bathing activity did not occur: Refused Body parts bathed by patient: Right arm, Left arm, Chest, Abdomen, Face   Body parts bathed by helper: Right lower leg, Left lower leg Body parts n/a: Front perineal area, Buttocks   Bathing assist Assist Level: Minimal Assistance - Patient > 75%     Upper Body Dressing/Undressing Upper body dressing   What is the patient wearing?: Pull over shirt    Upper body assist Assist Level: Set up assist    Lower Body Dressing/Undressing Lower body dressing      What is the patient wearing?: Pants     Lower body assist Assist for lower body dressing: Maximal Assistance - Patient 25 - 49%     Toileting Toileting    Toileting assist Assist for toileting: Total Assistance - Patient < 25%     Transfers Chair/bed transfer  Transfers assist  Chair/bed transfer activity did not occur: Safety/medical concerns  Chair/bed transfer assist level: Minimal Assistance - Patient > 75%     Locomotion Ambulation   Ambulation assist   Ambulation activity did not occur: Safety/medical concerns          Walk 10 feet activity   Assist  Walk 10 feet activity did not occur: Safety/medical concerns        Walk 50 feet activity   Assist Walk 50 feet with 2 turns activity did not occur: Safety/medical concerns         Walk 150 feet activity   Assist Walk 150  feet activity did not occur: Safety/medical concerns         Walk 10 feet on uneven surface  activity   Assist Walk 10 feet on uneven surfaces activity did not occur: Safety/medical concerns         Wheelchair     Assist Will patient use wheelchair at discharge?: Yes Type of Wheelchair: Manual Wheelchair activity did not occur: Safety/medical  concerns  Wheelchair assist level: Supervision/Verbal cueing Max wheelchair distance: 150    Wheelchair 50 feet with 2 turns activity    Assist    Wheelchair 50 feet with 2 turns activity did not occur: Safety/medical concerns   Assist Level: Supervision/Verbal cueing   Wheelchair 150 feet activity     Assist Wheelchair 150 feet activity did not occur: Safety/medical concerns   Assist Level: Supervision/Verbal cueing    Medical Problem List and Plan: 1.  Deficits with mobility, transfers, endurance, weakness secondary to multifactorial debility.  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 2.  Antithrombotics: -DVT/anticoagulation:  Pharmaceutical: Other (comment)--Eliquis. Continue to monitor hemoglobin as well as signs of bleeding.             -antiplatelet therapy: N/A 3. Pain Management: Tylenol prn 4. Mood: LCSW to follow for evaluate              -antipsychotic agents: N/A 5. Neuropsych: This patient is capable of making decisions on his own behalf. 6. Skin/Wound Care: Routine pressure relief measures.  7. Fluids/Electrolytes/Nutrition: Monitor I/O. Continue ensure bid. I: , O:   8. Empyema due to E coli: Continue ampicillin --Antibiotic day #28/28.  CT chest for follow-up, ask CVTS to review 9. ABLA due to GIB/spontaneous thigh hematoma: Continue protonix bid. Received 1 unit PRBC 4/24 and feraheme 4/26.   Now on iron supplement.  Added folic acid due to low folate levels.    Hemoglobin 7.2 on 4/30, hemoglobin 7.6 on 5/7, now up to 8.2 on 5/11, no need for transfusion   10. ICM/Acute systolic CHF: Monitor daily weights.  Monitor for signs/symptoms of fluid overload.  BP remains soft--no ARB. Continue coreg and Crestor.  Filed Weights   12/10/18 0606  Weight: 119.7 kg  No breathing issues, no chest pains  -continue to monitor  -oxygen at night, no CPAP  right now 11. 3 vessel CAD: Not a surgical candidate-treated medically 12. Paroxymal A Fib with RVR: Monitor HR bid-continue amiodarone, coreg and Eliquis. Cards recommends re-eval prior to discharge.  13. Bradycardia/tachy: rate controlled at present    Coreg 3.125  twice daily, still is off digoxin. amio 200mg  bid     -cardiology has seen pt 14. Protein calorie malnutrition: Continue to offer supplements tid. Magnesium supplemented yesterday due to low normal levels.  Added Mg supplement..  15. Orthostatic hypotension:Improved  .   -Thigh high TEDs.  Abdominal binder if needed, encourage fluids   tolerates therapy    Aaron [name] was seen today for the purpose of a mobility assessment for a powered wheelchair. I have reviewed and agree with the detailed PT evaluation. 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 Due to poor cardiac function, he is unable to utilize a cane, walker, manual wheelchair, or scooter. The patient is appropriate for a customized power wheelchair. Specifically, the patient requires a power wheelchair with adjustable height.  With a  power wheelchair  he can move independently at a household level and on a limited basis in the community. The chair will also allow the patient to perform ADL's more easily. The patient is competent to operate the recommended chair on his own and is motivated to utilize the chair on a daily basis.   LOS: 20 days A FACE TO FACE EVALUATION WAS PERFORMED  Erick Colace 12/15/2018, 7:49 AM

## 2018-12-15 NOTE — Progress Notes (Signed)
Physical Therapy Session Note  Patient Details  Name: Aaron [name] MRN: [id number] Date of Birth: Dec 25, 1965  Today's Date: 12/15/2018 PT Individual Time: 1300-1400 PT Individual Time Calculation (min): 60 min   Short Term Goals: Week 3:  PT Short Term Goal 1 (Week 3): Pt will perform bed mobility with supervision assist  PT Short Term Goal 2 (Week 3): Pt will perform SB transfer to and from [facility] with CGA  PT Short Term Goal 3 (Week 3): Pt will perform sit<>stand at rail or in parallel bars with max assist  PT Short Term Goal 4 (Week 3): Pt will propell WC 182ft with supervision assist and no more than 1 rest break   Skilled Therapeutic Interventions/Progress Updates:    Therapy Documentation Precautions:  Precautions Precautions: Fall Precaution Comments: monitor BP, hypotensive in sitting/standing Required Braces or Orthoses: Other Brace Other Brace: RLE Bledsoe brace locked in extension when ambulating that can be d/c'd when pt can perform 10 SLRs without assist Restrictions Weight Bearing Restrictions: No RLE Weight Bearing: Weight bearing as tolerated LLE Weight Bearing: Weight bearing as tolerated  Treatment: Pt seated in manual wheelchair on arrival to room. Agreed to PT at this time. No pain at this time.  Transfers: Use of [place] Dec plus for sit<>stand x 4 reps. With each stand worked on full upright posture. In addition over the course of the 4 stands worked on lateral weight shifting, hip extension, and bil knee flexion/extension. Min guard assist for safety with assistance/facilitation at pelvis/shoulder to achieve good upright standing posture.   Bed Mobility: Pt transferred to bed via [place] Dec plus on 4th stand with 2 person assist for safety. Attempted a 5th stand at edge of bed with pt unable to due to fatigue. Pt able to lower trunk down onto bed surface, needed max to total assist to lift bil LE up and clear bed surface. Once in bed pt able to use UE's on head board to scoot  himself up in the bed.    Exercises: Bil LE's: quad sets for 5 sec holds, straight leg raises with assist to maintain knee extension, hip abduction/adduction with assist to maintain neutral position/prevent rotation, short arc quads with assistance for eccentric lowering, and pillow squeezes with 5 sec holds. 10 reps with each exercise; to address core strengthening with HOB elevated ~40 degrees- forward reaching toward PTA's hands as targets for modified curl ups for 10 reps, then lateral reaching across body to PTA hand as targets for 10 reps each side. Ended with pt performing modified curl up while tossing 3# weighted ball to PTA for 5 reps, rest, then 6 more reps.   Pt left in bed with all needs in reach, rails up x 3 and bed alarm on. No complaints other than fatigue after session.     Therapy/Group: Individual Therapy  Livingston Diones, PTA, CLT 12/15/18, 4:43 PM 12/15/2018, 4:43 PM

## 2018-12-15 NOTE — Progress Notes (Signed)
Physical Therapy Session Note  Patient Details  Name: Aaron [name] MRN: [id number] Date of Birth: [birth year]  Today's Date: 12/15/2018 PT Individual Time: 1100-1159 PT Individual Time Calculation (min): 59 min   Short Term Goals: Week 3:  PT Short Term Goal 1 (Week 3): Pt will perform bed mobility with supervision assist  PT Short Term Goal 2 (Week 3): Pt will perform SB transfer to and from [facility] with CGA  PT Short Term Goal 3 (Week 3): Pt will perform sit<>stand at rail or in parallel bars with max assist  PT Short Term Goal 4 (Week 3): Pt will propell WC 14f with supervision assist and no more than 1 rest break   Skilled Therapeutic Interventions/Progress Updates: Pt presented in bed agreeable to therapy. Pt indicating some pain in RLE however not significant enough to request meds. PTA discussed possible use of Hoyer at home for transfers when becoming more fatigued. Pt seemed to be agreeable. Pt trialed HHarrel Lemontransfer which required increased time due to mechanical complications with bed. Pt was able to tolerate transfer easily. Informed BJacqlyn Larsen LSW regarding Hoyer. Pt was able to scoot back and reposition in chair once leg rests applied. Pt then transported to ortho gym and participated in UBE 2 min forward/backwards ea. Pt then propelled approx [id number] level with no rest breaks, pt transported remaining distance back to room and remained in w/c. Pt left with call bell within reach and needs met.      Therapy Documentation Precautions:  Precautions Precautions: Fall Precaution Comments: monitor BP, hypotensive in sitting/standing Required Braces or Orthoses: Other Brace Other Brace: RLE Bledsoe brace locked in extension when ambulating that can be d/c'd when pt can perform 10 SLRs without assist Restrictions Weight Bearing Restrictions: No RLE Weight Bearing: Weight bearing as tolerated LLE Weight Bearing: Weight bearing as tolerated General:   Vital Signs:      Therapy/Group: Individual Therapy  Johneisha Broaden  Georgenia Salim, PTA  12/15/2018, 12:44 PM

## 2018-12-15 NOTE — Progress Notes (Signed)
Occupational Therapy Session Note  Patient Details  Name: Aaron [name] MRN: [id number] Date of Birth: 19-Oct-1965  Today's Date: 12/15/2018 OT Individual Time: 0700-0800 OT Individual Time Calculation (min): 60 min    Short Term Goals: Week 3:  OT Short Term Goal 1 (Week 3): STGs=LTGs secondary to upcoming discharge  Skilled Therapeutic Interventions/Progress Updates:    Pt resting in bed upon arrival. OT intervention with focus on discharge planning, bed mobility, slide board transfers, self care w/c level at sink, BUE therex, and activity tolerance to increase independence with BADLs. Pt requires max A for LB dressing at bed level.  Pt able to roll R<>L with use of bed rails and no assistance. Pt performs slide board transfer bed>w/c (level surface) with supervision after board placement. Pt engaged in BUE therex with 5# bar (rowing and overhead presses-2 X 10). Discussed use of hoyer lift at home when he is fatigued.  Pt in agreement and acknowledges that when is fatigued he is unable to complete slide board tranfser. Pt remained seated in w/c with all needs within reach.   Therapy Documentation Precautions:  Precautions Precautions: Fall Precaution Comments: monitor BP, hypotensive in sitting/standing Required Braces or Orthoses: Other Brace Other Brace: RLE Bledsoe brace locked in extension when ambulating that can be d/c'd when pt can perform 10 SLRs without assist Restrictions Weight Bearing Restrictions: No RLE Weight Bearing: Weight bearing as tolerated LLE Weight Bearing: Weight bearing as tolerated Pain:  Pt c/o persistent RLE discomfort; repositioned   Therapy/Group: Individual Therapy  Rich Brave 12/15/2018, 8:06 AM

## 2018-12-15 NOTE — Plan of Care (Signed)
  Problem: RH BOWEL ELIMINATION Goal: RH STG MANAGE BOWEL WITH ASSISTANCE Description STG Manage Bowel with Assistance. Mod  Outcome: Progressing   Problem: RH BLADDER ELIMINATION Goal: RH STG MANAGE BLADDER WITH ASSISTANCE Description STG Manage Bladder With Assistance. Min  Outcome: Progressing   Problem: RH SKIN INTEGRITY Goal: RH STG SKIN FREE OF INFECTION/BREAKDOWN Description Free of skin breakdown, infection with mod assist  Outcome: Progressing   Problem: RH PAIN MANAGEMENT Goal: RH STG PAIN MANAGED AT OR BELOW PT'S PAIN GOAL Description No pain or less than 3  Outcome: Progressing   

## 2018-12-16 ENCOUNTER — Encounter (HOSPITAL_COMMUNITY): Payer: Self-pay | Admitting: Psychology

## 2018-12-16 ENCOUNTER — Ambulatory Visit (HOSPITAL_COMMUNITY): Payer: Self-pay | Admitting: Physical Therapy

## 2018-12-16 ENCOUNTER — Inpatient Hospital Stay (HOSPITAL_COMMUNITY): Payer: Self-pay

## 2018-12-16 ENCOUNTER — Encounter (HOSPITAL_COMMUNITY): Payer: Self-pay | Admitting: Occupational Therapy

## 2018-12-16 LAB — CBC WITH DIFFERENTIAL/PLATELET
Abs Immature Granulocytes: 0.2 10*3/uL — ABNORMAL HIGH (ref 0.00–0.07)
Basophils Absolute: 0.1 10*3/uL (ref 0.0–0.1)
Basophils Relative: 2 %
Eosinophils Absolute: 0 10*3/uL (ref 0.0–0.5)
Eosinophils Relative: 0 %
HCT: 32.4 % — ABNORMAL LOW (ref 39.0–52.0)
Hemoglobin: 9.6 g/dL — ABNORMAL LOW (ref 13.0–17.0)
Lymphocytes Relative: 14 %
Lymphs Abs: 0.8 10*3/uL (ref 0.7–4.0)
MCH: 29.9 pg (ref 26.0–34.0)
MCHC: 29.6 g/dL — ABNORMAL LOW (ref 30.0–36.0)
MCV: 100.9 fL — ABNORMAL HIGH (ref 80.0–100.0)
Metamyelocytes Relative: 3 %
Monocytes Absolute: 0.4 10*3/uL (ref 0.1–1.0)
Monocytes Relative: 7 %
Neutro Abs: 4 10*3/uL (ref 1.7–7.7)
Neutrophils Relative %: 74 %
Platelets: 252 10*3/uL (ref 150–400)
RBC: 3.21 MIL/uL — ABNORMAL LOW (ref 4.22–5.81)
RDW: 21.3 % — ABNORMAL HIGH (ref 11.5–15.5)
WBC: 5.4 10*3/uL (ref 4.0–10.5)
nRBC: 0 % (ref 0.0–0.2)
nRBC: 1 /100 WBC — ABNORMAL HIGH

## 2018-12-16 LAB — BASIC METABOLIC PANEL
Anion gap: 10 (ref 5–15)
BUN: 24 mg/dL — ABNORMAL HIGH (ref 6–20)
CO2: 25 mmol/L (ref 22–32)
Calcium: 8.9 mg/dL (ref 8.9–10.3)
Chloride: 105 mmol/L (ref 98–111)
Creatinine, Ser: 1.03 mg/dL (ref 0.61–1.24)
GFR calc Af Amer: >60 mL/min (ref >60)
GFR calc non Af Amer: >60 mL/min (ref >60)
Glucose, Bld: 95 mg/dL (ref 70–99)
Potassium: 3.8 mmol/L (ref 3.5–5.1)
Sodium: 140 mmol/L (ref 135–145)

## 2018-12-16 NOTE — Progress Notes (Signed)
Occupational Therapy Session Note  Patient Details  Name: Aaron [name] MRN: [id number] Date of Birth: 1965/08/10  Today's Date: 12/16/2018 OT Individual Time: 0900-1000 OT Individual Time Calculation (min): 60 min    Short Term Goals: Week 3:  OT Short Term Goal 1 (Week 3): STGs=LTGs secondary to upcoming discharge  Skilled Therapeutic Interventions/Progress Updates:    Upon entering the room, pt supine in bed with no c/o pain and agreeable to OT intervention. Pt needing max A to pull pants over B hips but pt able to partially pull up himself. Pt rolling L <> R with S and use of bed rails for clothing management. Supine >sit with min guard to EOB. Pt sitting for 10 minutes with close supervision awaiting family for hands on education. Pt's wife and son arriving and OT reviewing pt's goals and recommendation for LB to be bed level and UB seated EOB or once in wheelchair. OT educated caregivers on slide board transfer to drop arm commode chair. Caregiver placing board properly and cuing given for caregiver to stedy equipment for safety. Once on toileting, OT reviewing concerns regarding inconsistency secondary to fatigue level and recommendation for hoyer lift as well as home. Caregivers placing sling while pt seated in wheelchair with min cuing for proper technique. Hoyer transfer from drop arm commode chair > wheelchair with min cuing for technique and safety. OT verbalizing need of 2 people to complete transfers safely at home. Pt remained in wheelchair with caregivers present as OT exited the room.   Therapy Documentation Precautions:  Precautions Precautions: Fall Precaution Comments: monitor BP, hypotensive in sitting/standing Required Braces or Orthoses: Other Brace Other Brace: RLE Bledsoe brace locked in extension when ambulating that can be d/c'd when pt can perform 10 SLRs without assist Restrictions Weight Bearing Restrictions: No RLE Weight Bearing: Weight bearing as tolerated LLE  Weight Bearing: Weight bearing as tolerated   Therapy/Group: Individual Therapy  Alen Bleacher 12/16/2018, 11:50 AM

## 2018-12-16 NOTE — Patient Care Conference (Signed)
Inpatient RehabilitationTeam Conference and Plan of Care Update Date: 12/16/2018   Time: 10:36 AM    Patient Name: Aaron [name]      Medical Record Number: [id number]  Date of Birth: 1965-12-17 Sex: Male         Room/Bed: 4W14C/4W14C-01 Payor Info: Payor: MEDICAID PENDING / Plan: MEDICAID PENDING / Product Type: *No Product type* /    Admitting Diagnosis: CVA 1 Team  Cardiac; 14-16days  Admit Date/Time:  11/25/2018  3:50 PM Admission Comments: No comment available   Primary Diagnosis:  <principal problem not specified> Principal Problem: <principal problem not specified>  Patient Active Problem List   Diagnosis Date Noted  . Chronic systolic CHF (congestive heart failure) (HCC) 11/30/2018  . Orthostasis   . Empyema (HCC)   . Elevated BUN   . PAF (paroxysmal atrial fibrillation) (HCC)   . Coronary artery disease involving native coronary artery of native heart without angina pectoris   . Acute systolic congestive heart failure (HCC)   . Pleural effusion   . Empyema of pleural space (HCC) 11/12/2018  . Debility 11/05/2018  . Pressure injury of skin 11/04/2018  . Iron deficiency anemia   . Orthostatic hypotension   . Hematoma of right thigh 10/30/2018  . Acute blood loss anemia 10/30/2018  . Tobacco abuse 10/30/2018  . Gastritis with bleeding 10/30/2018  . Esophagitis, [place] grade C 10/30/2018  . Protein-calorie malnutrition, severe 10/26/2018  . SOB (shortness of breath)   . Hypokalemia   . Atrial fibrillation with rapid ventricular response (HCC) 10/12/2018  . Morbid obesity (HCC) 10/09/2018  . Acute systolic CHF (congestive heart failure) (HCC) 10/02/2018  . Acute hypoxemic respiratory failure (HCC) 10/02/2018  . Anasarca 10/01/2018  . AKI (acute kidney injury) (HCC) 10/01/2018    Expected Discharge Date: Expected Discharge Date: 12/18/18  Team Members Present: Physician leading conference: Dr. Claudette Laws Social Worker Present: Dossie Der, LCSW Nurse  Present: Other (comment)(Meredith Dunzweiler-RN) PT Present: Midge Minium, PT OT Present: Jackquline Denmark, OT SLP Present: Colin Benton, SLP PPS Coordinator present : Fae Pippin     Current Status/Progress Goal Weekly Team Focus  Medical   CT of the chest shows resolution of empyema on the right side, improvement, antibiotics completed, PICC line removed today  Manage bowel and bladder incontinence, reduce fall risk, reduce rehospitalization risk  Discharge planning, caregiver training   Bowel/Bladder   Patient is con/incontient of bowel 12/15/18.  Contient of bladder.   Encouraged timed tolieting. Maintaining regular bowel movement.   Becoming fully contient of bowel.    Swallow/Nutrition/ Hydration             ADL's   LB self care-max A; min A UB self care; slide board transfers-CGA but with fatigue will need manual hoyer  min A overall with mod A for sit <>stand  education, functional tranfsers, strengthening, activity tolerance   Mobility   CGA bed mobilty with bed features, minA SB transfer, supervision w/c propulsion. W/c eval done 5/18.   Mod assist overall at [facility] level.   endurance, family Edu, LE strengthing   Communication             Safety/Cognition/ Behavioral Observations            Pain   No c/o pain  Contiune to stay free of pain.  Staying free of pain.    Skin   Patient Has MASD to Buttocks and Groin which is being cleaned each shift.Skin tear that is healing to right buttocks  that is being cleansed and foam dressing applied.   To keep the patient free of any new skin issues and to promote skin healing on affted areas   To keep free from any new skin break down and promte healing.       *See Care Plan and progress notes for long and short-term goals.     Barriers to Discharge  Current Status/Progress Possible Resolutions Date Resolved   Physician    Medical stability     Progressing slowly  Discharge planning caregiver training      Nursing                   PT                    OT                  SLP                SW                Discharge Planning/Teaching Needs:  Family education today with wife. Plan to go home on Friday. Wife aware will need 24 hr care at home.      Team Discussion:  Progressing toward his goals of mod/max level of assist. Family here for education today. IV antibotics finished. CT scan looked good yesterday. Stage 2 bottom healing education with family today. Wife arranging transport home via ambulance due to works for [employer]  Revisions to Treatment Plan:  DC 5/22    Continued Need for Acute Rehabilitation Level of Care: The patient requires daily medical management by a physician with specialized training in physical medicine and rehabilitation for the following conditions: Daily direction of a multidisciplinary physical rehabilitation program to ensure safe treatment while eliciting the highest outcome that is of practical value to the patient.: Yes Daily medical management of patient stability for increased activity during participation in an intensive rehabilitation regime.: Yes Daily analysis of laboratory values and/or radiology reports with any subsequent need for medication adjustment of medical intervention for : Neurological problems;Cardiac problems   I attest that I was present, lead the team conference, and concur with the assessment and plan of the team. Teleconference held due to COVID 19   Poet Hineman, Lemar Livings 12/16/2018, 10:36 AM

## 2018-12-16 NOTE — Plan of Care (Signed)
  Problem: RH BOWEL ELIMINATION Goal: RH STG MANAGE BOWEL WITH ASSISTANCE Description STG Manage Bowel with Assistance. Mod  Outcome: Progressing Flowsheets (Taken 12/16/2018 1433) STG: Pt will manage bowels with assistance: 2-Maximum assistance   Problem: RH BLADDER ELIMINATION Goal: RH STG MANAGE BLADDER WITH ASSISTANCE Description STG Manage Bladder With Assistance. Min  Outcome: Progressing Flowsheets (Taken 12/16/2018 1433) STG: Pt will manage bladder with assistance: 4-Minimal assistance   Problem: RH SKIN INTEGRITY Goal: RH STG SKIN FREE OF INFECTION/BREAKDOWN Description Free of skin breakdown, infection with mod assist  Outcome: Progressing   Problem: RH PAIN MANAGEMENT Goal: RH STG PAIN MANAGED AT OR BELOW PT'S PAIN GOAL Description No pain or less than 3  Outcome: Progressing

## 2018-12-16 NOTE — Progress Notes (Signed)
[facility] PHYSICAL MEDICINE & REHABILITATION PROGRESS NOTE   Subjective/Complaints:  Discussed CT chest results, discussed discontinuation of PICC line  ROS: Patient denies , nausea, vomiting, diarrhea, cough, shortness of breath or chest pain, Objective:   Ct Chest Wo Contrast  Result Date: 12/15/2018 CLINICAL DATA:  Follow-up empyema EXAM: CT CHEST WITHOUT CONTRAST TECHNIQUE: Multidetector CT imaging of the chest was performed following the standard protocol without IV contrast. COMPARISON:  CT from 11/12/2018, recent chest x-ray from 11/02/2018 FINDINGS: Cardiovascular: Thoracic aorta shows no significant atherosclerotic calcifications. No aneurysmal dilatation is seen. Mild cardiac enlargement is noted particularly related to biventricular enlargement. Coronary calcifications are seen. Mediastinum/Nodes: Thoracic inlet is within normal limits. Left-sided PICC line is seen cavoatrial junction. Stable mediastinal lymph nodes in the precarinal region are seen. Mild right hilar adenopathy is noted although incompletely evaluated due to lack of IV contrast. The esophagus is within normal limits. Lungs/Pleura: Mild left lower lobe atelectasis is noted stable from the prior exam. This may represent some focal scarring. Emphysematous changes are seen bilaterally. Right lower lobe atelectasis is seen. The previously seen empyema has reduced significantly with only a small air-fluid collection remaining. This is consistent with prior catheter based drainage. No new significant fluid collection is seen. Upper Abdomen: Visualized upper abdomen is unremarkable. Musculoskeletal: Mild degenerative changes of the thoracic spine are noted. IMPRESSION: Significant improvement in right-sided empyema. Mild bibasilar atelectasis is noted. No new focal abnormality is seen. Emphysema (ICD10-J43.9). Electronically Signed   By: Inez Catalina M.D.   On: 12/15/2018 13:15   No results for input(s): WBC, HGB, HCT, PLT in the  last 72 hours. No results for input(s): NA, K, CL, CO2, GLUCOSE, BUN, CREATININE, CALCIUM in the last 72 hours.  Intake/Output Summary (Last 24 hours) at 12/16/2018 0908 Last data filed at 12/16/2018 0554 Gross per 24 hour  Intake 322 ml  Output 600 ml  Net -278 ml     Physical Exam: Vital Signs Blood pressure 120/79, pulse 80, temperature 98.8 F (37.1 C), temperature source Oral, resp. rate 16, height 6' 0.4" (1.839 m), weight 119.7 kg, SpO2 94 %. Constitutional: No distress . Vital signs reviewed. HEENT: EOMI, oral membranes moist Neck: supple Cardiovascular: RRR without murmur. No JVD    Respiratory: CTA Bilaterally without wheezes or rales. Normal effort, [place]   GI: BS +, non-tender, non-distended  Musc: Edema in bilateral feet Neurologic: Alert Motor: Bilateral upper extremities: 4/5 proximal distal Bilateral lower extremities:  Right lower extremity: Hip flexion 2/5, knee extension 2+/5, ankle dorsiflexion 3/5, stable Left lower extremity: Hip flexion 3-/5, knee extension 3/5, ankle dorsiflexion 4/5, stable Skin: Lower extremities and [place] boots Sensation reduced right thigh, anterior medial and lateral aspect including the right prepatellar area--stable Psych: pleasant  Assessment/Plan: 1. Functional deficits secondary to debility secondary to CHF and empyema which require 3+ hours per day of interdisciplinary therapy in a comprehensive inpatient rehab setting.  Physiatrist is providing close team supervision and 24 hour management of active medical problems listed below.  Physiatrist and rehab team continue to assess barriers to discharge/monitor patient progress toward functional and medical goals  Care Tool:  Bathing  Bathing activity did not occur: Refused Body parts bathed by patient: Right arm, Left arm, Chest, Abdomen, Face   Body parts bathed by helper: Right lower leg, Left lower leg Body parts n/a: Front perineal area, Buttocks   Bathing assist Assist  Level: Minimal Assistance - Patient > 75%     Upper Body Dressing/Undressing Upper body  dressing   What is the patient wearing?: Pull over shirt    Upper body assist Assist Level: Set up assist    Lower Body Dressing/Undressing Lower body dressing      What is the patient wearing?: Pants     Lower body assist Assist for lower body dressing: Maximal Assistance - Patient 25 - 49%     Toileting Toileting    Toileting assist Assist for toileting: Total Assistance - Patient < 25%     Transfers Chair/bed transfer  Transfers assist  Chair/bed transfer activity did not occur: Safety/medical concerns  Chair/bed transfer assist level: Minimal Assistance - Patient > 75%     Locomotion Ambulation   Ambulation assist   Ambulation activity did not occur: Safety/medical concerns          Walk 10 feet activity   Assist  Walk 10 feet activity did not occur: Safety/medical concerns        Walk 50 feet activity   Assist Walk 50 feet with 2 turns activity did not occur: Safety/medical concerns         Walk 150 feet activity   Assist Walk 150 feet activity did not occur: Safety/medical concerns         Walk 10 feet on uneven surface  activity   Assist Walk 10 feet on uneven surfaces activity did not occur: Safety/medical concerns         Wheelchair     Assist Will patient use wheelchair at discharge?: Yes Type of Wheelchair: Manual Wheelchair activity did not occur: Safety/medical concerns  Wheelchair assist level: Supervision/Verbal cueing Max wheelchair distance: 150    Wheelchair 50 feet with 2 turns activity    Assist    Wheelchair 50 feet with 2 turns activity did not occur: Safety/medical concerns   Assist Level: Supervision/Verbal cueing   Wheelchair 150 feet activity     Assist Wheelchair 150 feet activity did not occur: Safety/medical concerns   Assist Level: Supervision/Verbal cueing    Medical Problem List  and Plan: 1.  Deficits with mobility, transfers, endurance, weakness secondary to multifactorial debility.  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 Formulized individual treatment plan based on medical history, underlying problem and comorbidities. 2.  Antithrombotics: -DVT/anticoagulation:  Pharmaceutical: Other (comment)--Eliquis. Continue to monitor hemoglobin as well as signs of bleeding.             -antiplatelet therapy: N/A 3. Pain Management: Tylenol prn 4. Mood: LCSW to follow for evaluate              -antipsychotic agents: N/A 5. Neuropsych: This patient is capable of making decisions on his own behalf. 6. Skin/Wound Care: Routine pressure relief measures.  7. Fluids/Electrolytes/Nutrition: Monitor I/O. Continue ensure bid. I: 825m , O: 11521m  8. Empyema due to E coli: Continue ampicillin --Antibiotic pleated.  CT chest, shows resolution, small residual pleural effusion, follow-up with CVTS as outpatient 9. ABLA due to GIB/spontaneous thigh hematoma: Continue protonix bid. Received 1 unit PRBC 4/24 and feraheme 4/26.   Now on iron supplement.  Added folic acid due to low folate levels.    Hemoglobin 7.2 on 4/30, hemoglobin 7.6 on 5/7, now up to 8.2 on 5/11, no need for transfusion   10. ICM/Acute systolic CHF:  Monitor daily weights.  Monitor for signs/symptoms of fluid overload.  BP remains soft--no ARB. Continue coreg and Crestor.  Filed Weights  No breathing issues, no chest pains  -continue to monitor  -oxygen at night, no CPAP right now 11. 3 vessel CAD: Not a surgical candidate-treated medically 12. Paroxymal A Fib with RVR: Monitor HR bid-continue amiodarone, coreg and Eliquis. Cards recommends re-eval prior to  discharge.  13. Bradycardia/tachy: rate controlled at present    Coreg 3.125  twice daily, still is off digoxin. amio 22m bid     -cardiology has seen pt 14. Protein calorie malnutrition: Continue to offer supplements tid. Magnesium supplemented yesterday due to low normal levels.  Added Mg supplement..  15. Orthostatic hypotension:Improved  .   -Thigh high TEDs.  Abdominal binder if needed, encourage fluids   tolerates therapy      L[place]: [place] 12/16/2018, 9:08 AM

## 2018-12-16 NOTE — Progress Notes (Signed)
Physical Therapy Session Note  Patient Details  Name: Aaron [name] MRN: [id number] Date of Birth: 08/30/1965  Today's Date: 12/16/2018 PT Individual Time: 1000-1100 PT Individual Time Calculation (min): 60 min   Short Term Goals: Week 3:  PT Short Term Goal 1 (Week 3): Pt will perform bed mobility with supervision assist  PT Short Term Goal 2 (Week 3): Pt will perform SB transfer to and from WC with CGA  PT Short Term Goal 3 (Week 3): Pt will perform sit<>stand at rail or in parallel bars with max assist  PT Short Term Goal 4 (Week 3): Pt will propell WC 150ft with supervision assist and no more than 1 rest break   Skilled Therapeutic Interventions/Progress Updates: Pt presented in w/c with family ready agreeable to therapy. Session focused on family education regarding use of Hoyer, car transfer, and introduction to power chair. Pt transported to ortho gym to perform car transfer. Pt discovered from wife that sedan vehicle that was planned to be used has inoperable battery.Set car yto mini van height with PTA and pt verbalizing that at pt's current condition and decreased reserve pt most likey unable to complete transfer via SB. Thus discussed non-emergent ambulance as option. Wife will look into on her end and [name] LSW advised. Pt then returned to room and provided introduction to power chair. Demonstrated lift and tilt and demonstrated appropriate positioning for chair to perform transfer. [name] pt's wife performed Hoyer life transfer out to standard chair with pt providing good feedback as to which loops to use and set up when needed.  Transfer was then performed to power chair with PTA demonstrating to son use of straps to pull pt back into seat. Pt then instruction of drive, tilt, and lift features of chair. Pt drove power chair in room, in hallway, and to day room with fair safety. Pt returned to room and was able to safely position chair next to bed. PTA answered any additional queries as arose  throughout session. Pt left in power chair with belt alarm on, call bell within reach and needs met.      Therapy Documentation Precautions:  Precautions Precautions: Fall Precaution Comments: monitor BP, hypotensive in sitting/standing Required Braces or Orthoses: Other Brace Other Brace: RLE Bledsoe brace locked in extension when ambulating that can be d/c'd when pt can perform 10 SLRs without assist Restrictions Weight Bearing Restrictions: No RLE Weight Bearing: Weight bearing as tolerated LLE Weight Bearing: Weight bearing as tolerated General:   Vital Signs:   Pain:      Therapy/Group: Individual Therapy      , PTA  12/16/2018, 12:33 PM  

## 2018-12-16 NOTE — Progress Notes (Signed)
Occupational Therapy Session Note  Patient Details  Name: Aaron [name] MRN: [id number] Date of Birth: 1966-07-02  Today's Date: 12/16/2018 OT Individual Time: 1315-1355 OT Individual Time Calculation (min): 40 min    Short Term Goals: Week 3:  OT Short Term Goal 1 (Week 3): STGs=LTGs secondary to upcoming discharge  Skilled Therapeutic Interventions/Progress Updates:    Pt resting in power w/c upon arrival. OT intervention with focus on w/c mobility to position w/c next to bed to facilitate slide board transfer to bed.  Pt required min verbal cues to assure positioning was optimal.  Pt performed slide board transfer to be with CGA. Pt required max A for sit>supine in bed. Pt required assistance repositioning in bed. Continued discharge planning. Pt pleased with progress and ready for discharge on 5/22. Pt remained in bed with all needs within reach.   Therapy Documentation Precautions:  Precautions Precautions: Fall Precaution Comments: monitor BP, hypotensive in sitting/standing Required Braces or Orthoses: Other Brace Other Brace: RLE Bledsoe brace locked in extension when ambulating that can be d/c'd when pt can perform 10 SLRs without assist Restrictions Weight Bearing Restrictions: No RLE Weight Bearing: Weight bearing as tolerated LLE Weight Bearing: Weight bearing as tolerated   Pain:  Pt denies pain   Therapy/Group: Individual Therapy  Rich Brave 12/16/2018, 2:33 PM

## 2018-12-16 NOTE — Progress Notes (Addendum)
Social Work [name], male   DOB: 12/06/1965, 53 y.o.   MRN: [id number] Wife and son are here for family education and to learn his care in preparation for DC Friday. [employer] should be contacting wife to schedule delivery of hospital bed and hoyer lift. Pt glad they are here and looking forward to going home Friday.

## 2018-12-17 ENCOUNTER — Inpatient Hospital Stay (HOSPITAL_COMMUNITY): Payer: Self-pay | Admitting: Physical Therapy

## 2018-12-17 ENCOUNTER — Inpatient Hospital Stay (HOSPITAL_COMMUNITY): Payer: Self-pay | Admitting: Occupational Therapy

## 2018-12-17 ENCOUNTER — Inpatient Hospital Stay (HOSPITAL_COMMUNITY): Payer: Self-pay | Admitting: *Deleted

## 2018-12-17 NOTE — Progress Notes (Signed)
Occupational Therapy Discharge Summary  Patient Details  Name: Aaron [name] MRN: [id number] Date of Birth: [birth year]  Today's Date: 12/17/2018 OT Individual Time: 0076-2263 OT Individual Time Calculation (min): 59 min    Patient has met 6 of 6 long term goals due to improved activity tolerance, improved balance, postural control, ability to compensate for deficits, improved awareness and improved coordination.  Patient to discharge at overall min - max A level.  Patient's care partner is independent to provide the necessary physical assistance at discharge.  His wife and son attended family education.   Reasons goals not met: All goals met  Recommendation:  Patient will benefit from ongoing skilled OT services in home health setting to continue to advance functional skills in the area of BADL and Reduce care partner burden.  Equipment: hospital bed, hoyer lift, hoyer sling, slideboard, and drop arm commode chair   Reasons for discharge: treatment goals met  Patient/family agrees with progress made and goals achieved: Yes   OT Intervention: Upon entering the room, pt supine in bed with no c/o pain and agreeable to OT intervention. Pt reports feeling good about discharge and that family education went well. Pt performed slide board transfer from bed > power wheelchair with min A for safety and set up from therapist. Pt able to manipulate wheelchair to ADL apartment and practicing turning around in tight areas, backing up, and pulling up close to bed for transfer with min cuing for safety and technique. Pt returning back to room at end of session with call bell and all needed items within reach upon exiting the room.   OT Discharge Precautions/Restrictions  Precautions Precautions: Fall Required Braces or Orthoses: Other Brace Other Brace: RLE Bledsoe brace locked in extension when ambulating that can be d/c'd when pt can perform 10 SLRs without assist Restrictions Weight Bearing  Restrictions: No RLE Weight Bearing: Weight bearing as tolerated LLE Weight Bearing: Weight bearing as tolerated Pain Pain Assessment Pain Scale: 0-10 Pain Score: 0-No pain Vision Baseline Vision/History: Wears glasses Wears Glasses: Reading only Patient Visual Report: No change from baseline Vision Assessment?: No apparent visual deficits Perception  Perception: Within Functional Limits Praxis Praxis: Intact Cognition Overall Cognitive Status: Within Functional Limits for tasks assessed Arousal/Alertness: Awake/alert Orientation Level: Oriented X4 Sensation Sensation Light Touch: Impaired Detail Peripheral sensation comments: decreased appreciation to light touch due to nerve damage from hematoma .  Light Touch Impaired Details: Impaired RLE Proprioception: Appears Intact Coordination Gross Motor Movements are Fluid and Coordinated: No Fine Motor Movements are Fluid and Coordinated: Yes Coordination and Movement Description: poor coordination in the RLE from weakness  Heel Shin Test: unable to perform on the R Motor  Motor Motor: Other (comment) Motor - Discharge Observations: muscle paralysis/weakness in the R quad (improved from eval). generalized weakness Mobility  Bed Mobility Bed Mobility: Rolling Right;Rolling Left;Sit to Supine;Supine to Sit Rolling Right: Supervision/verbal cueing Rolling Left: Supervision/Verbal cueing Supine to Sit: Supervision/Verbal cueing Sit to Supine: Minimal Assistance - Patient > 75%  Trunk/Postural Assessment  Cervical Assessment Cervical Assessment: Exceptions to WFL(forward head) Thoracic Assessment Thoracic Assessment: Exceptions to WFL(rounded shoulders) Lumbar Assessment Lumbar Assessment: Exceptions to WFL(posterior pelvic tilt) Postural Control Postural Control: Within Functional Limits  Balance Balance Balance Assessed: Yes Static Sitting Balance Static Sitting - Level of Assistance: 7: Independent Dynamic Sitting  Balance Dynamic Sitting - Level of Assistance: 7: Independent Static Standing Balance Static Standing - Level of Assistance: Not tested (comment) Extremity/Trunk Assessment RUE Assessment RUE Assessment: Within Functional Limits  LUE Assessment LUE Assessment: Within Functional Limits   Gypsy Decant 12/17/2018, 12:04 PM

## 2018-12-17 NOTE — Discharge Summary (Signed)
Physician Discharge Summary  Patient ID: Omario Gumaer MRN: [id number] DOB/AGE: Dec 18, 1965 53 y.o.  Admit date: 11/25/2018 Discharge date: 12/18/2018  Discharge Diagnoses:  Principal Problem:   Debility Active Problems:   Anasarca   Morbid obesity (HCC)   Protein-calorie malnutrition, severe   Acute blood loss anemia   Iron deficiency anemia   Empyema of pleural space (HCC)   PAF (paroxysmal atrial fibrillation) (HCC)   Coronary artery disease involving native coronary artery of native heart without angina pectoris   Orthostasis   Chronic systolic CHF (congestive heart failure) (HCC)   Discharged Condition: stable   Significant Diagnostic Studies: Ct Chest Wo Contrast  Result Date: 12/15/2018 CLINICAL DATA:  Follow-up empyema EXAM: CT CHEST WITHOUT CONTRAST TECHNIQUE: Multidetector CT imaging of the chest was performed following the standard protocol without IV contrast. COMPARISON:  CT from 11/12/2018, recent chest x-ray from 11/02/2018 FINDINGS: Cardiovascular: Thoracic aorta shows no significant atherosclerotic calcifications. No aneurysmal dilatation is seen. Mild cardiac enlargement is noted particularly related to biventricular enlargement. Coronary calcifications are seen. Mediastinum/Nodes: Thoracic inlet is within normal limits. Left-sided PICC line is seen cavoatrial junction. Stable mediastinal lymph nodes in the precarinal region are seen. Mild right hilar adenopathy is noted although incompletely evaluated due to lack of IV contrast. The esophagus is within normal limits. Lungs/Pleura: Mild left lower lobe atelectasis is noted stable from the prior exam. This may represent some focal scarring. Emphysematous changes are seen bilaterally. Right lower lobe atelectasis is seen. The previously seen empyema has reduced significantly with only a small air-fluid collection remaining. This is consistent with prior catheter based drainage. No new significant fluid collection is seen.  Upper Abdomen: Visualized upper abdomen is unremarkable. Musculoskeletal: Mild degenerative changes of the thoracic spine are noted. IMPRESSION: Significant improvement in right-sided empyema. Mild bibasilar atelectasis is noted. No new focal abnormality is seen. Emphysema (ICD10-J43.9). Electronically Signed   By: Alcide Clever M.D.   On: 12/15/2018 13:15   Dg Chest 1v Repeat Same Day  Result Date: 11/23/2018 CLINICAL DATA:  History of right empyema drainage. Status post chest tube removal EXAM: CHEST - 1 VIEW SAME DAY COMPARISON:  11/23/2018 FINDINGS: Small stable right residual empyema and basilar airspace disease. No pneumothorax. Left lung is clear. Stable cardiomegaly. No acute osseous abnormality. Left-sided PICC line with the tip projecting over the SVC. IMPRESSION: 1. Small stable right residual empyema and basilar airspace disease. No pneumothorax. Electronically Signed   By: Elige Ko   On: 11/23/2018 11:54   Dg Chest Port 1 View  Result Date: 12/02/2018 CLINICAL DATA:  Shortness of breath. Nausea and vomiting. Recent right chest empyema EXAM: PORTABLE CHEST 1 VIEW COMPARISON:  Chest x-rays dated 11/30/2018, 11/24/2018, 11/23/2018 and chest CT dated 11/12/2018 FINDINGS: Chronic cardiomegaly. Pulmonary vascularity is normal. PICC tip is at the cavoatrial junction in good position, unchanged. There is persistent pleural thickening or loculated fluid in the right hemithorax laterally and at the base with some chronic atelectasis in the right base. There is loss of the silhouette of the left hemidiaphragm which could be due to atelectasis or small effusion. No acute bone abnormality. IMPRESSION: 1. No change in the appearance of the right hemithorax since the prior study. 2. Loss of the silhouette of the left hemidiaphragm could be due to atelectasis or small effusion. The lordotic position could also account for the blurring of the left hemidiaphragm. Electronically Signed   By: Francene Boyers M.D.    On: 12/02/2018 15:13   Dg Chest [facility]  1 View  Result Date: 11/24/2018 CLINICAL DATA:  Pleural effusion.  Chest tube removed yesterday. EXAM: PORTABLE CHEST 1 VIEW COMPARISON:  November 23, 2018 FINDINGS: Persistent right effusion with underlying opacity, stable in the interval. Minimal opacity in the right mid lung is stable as well. No pneumothorax. The left PICC line is in good position terminating in the central SVC. The left lung remains clear. Stable cardiomegaly. The hila and mediastinum are unremarkable. IMPRESSION: 1. The right-sided pleural effusion with underlying opacity and the mild opacity in the right mid lung are stable. 2. The left PICC line is in good position. 3. No changes. Electronically Signed   By: Gerome Samavid  Williams III M.D   On: 11/24/2018 08:43   Dg Chest Port 1 View  Result Date: 11/23/2018 CLINICAL DATA:  Empyema EXAM: PORTABLE CHEST 1 VIEW COMPARISON:  11/21/2018 FINDINGS: Stable left arm PICC and right chest tube. No pneumothorax. Loculated right pleural fluid is stable. Heterogeneous opacities at the right lung base are stable. Cardiac silhouette remains prominent likely due to AP technique. IMPRESSION: Stable right empyema and chest tube without pneumothorax. Stable right basilar pulmonary opacities. Electronically Signed   By: Jolaine ClickArthur  Hoss M.D.   On: 11/23/2018 08:03   Dg Chest Port 1v Same Day  Result Date: 11/30/2018 CLINICAL DATA:  Tachycardia EXAM: PORTABLE CHEST 1 VIEW COMPARISON:  11/24/2018 FINDINGS: Small right pleural effusion. Bilateral interstitial thickening slightly worse on the right. Mild right basilar atelectasis. No pneumothorax. Stable cardiomegaly. Left-sided PICC line with the tip projecting over the SVC. No acute osseous abnormality. IMPRESSION: Cardiomegaly with mild pulmonary vascular congestion. Stable right pleural effusion right basilar atelectasis. Electronically Signed   By: Elige KoHetal  Patel   On: 11/30/2018 21:11    Labs:  Basic Metabolic Panel: BMP  Latest Ref Rng & Units 12/16/2018 12/07/2018 12/04/2018  Glucose 70 - 99 mg/dL 95 92 87  BUN 6 - 20 mg/dL 16(X24(H) 09(U27(H) 04(V49(H)  Creatinine 0.61 - 1.24 mg/dL 4.091.03 8.110.87 9.140.99  Sodium 135 - 145 mmol/L 140 139 140  Potassium 3.5 - 5.1 mmol/L 3.8 3.7 4.0  Chloride 98 - 111 mmol/L 105 105 98  CO2 22 - 32 mmol/L 25 26 29   Calcium 8.9 - 10.3 mg/dL 8.9 7.8(G8.3(L) 9.5(A8.6(L)    CBC: CBC Latest Ref Rng & Units 12/16/2018 12/07/2018 12/03/2018  WBC 4.0 - 10.5 K/uL 5.4 7.7 11.6(H)  Hemoglobin 13.0 - 17.0 g/dL 2.1(H9.6(L) 0.8(M8.2(L) 7.6(L)  Hematocrit 39.0 - 52.0 % 32.4(L) 27.3(L) 25.8(L)  Platelets 150 - 400 K/uL 252 242 290    CBG: No results for input(s): GLUCAP in the last 168 hours.  Brief HPI:   Bettye BoeckGlen Carlyon is a 53 year old male without significant medical history who was originally admitted on 10/01/18 with anasarca and SOB with difficulty walking. He was noted to be in fluid overload and found to have ICM with EF 15% and severe CAD but not felt to be a surgical candidate. Hospital course complicated by A fib with RVR, ABLA due to right thigh hematoma and GIB, ileus as well as sever hypotension with debility. He was admitted to CIR on  11/05/18 and developed SOB due to large right empyema requiring transfer to acute hospital on 11/12/18.  he was started on IV antibiotics and empyema treated with placement of pigtail catheter and thrombolytics. Chest tube had 4 L output positive for E coli which was pansensitive and antibiotics narrowed to ampicillin with recommendations for 4 weeks antibiotic course. Chest tube removed on 4/27and was  improving medically but continued to have significant debility with ongoing hypotension. He was cleared to resume his CIR stay on 4/29.      Hospital Course: Anselm Gallen was admitted to rehab 11/25/2018 for inpatient therapies to consist of PT, ST and OT at least three hours five days a week. Past admission physiatrist, therapy team and rehab RN have worked together to provide customized  collaborative inpatient rehab.   He completed 4-week course of ampicillin for treatment of E. coli empyema on 4/19.  Follow-up CT of chest shows significant improvement in right sided empyema, bilateral emphysematous changes and no new abnormality.  He continues on iron supplement for ABLA and follow up CBC showed improvement in H/H. [facility] hospitalist was consulted to help with medical management and signed off after a week as patient stable. His respiratory status has improved and he has been weaned to room air.  He has refused to use CPAP at nights therefore this was discontinued  Dr. Delton See was consulted  On 5/6 for input as patient developed A fib with RVR. Amiodarone was added for rate control and she recommended decreasing lasix to 20 mg daily as patient evuolemic and due to ongoing orthostatic issues.  Acute on chronic combined CHF has been monitored with daily weights.  His weight has trended up however as well it was likely due to increase in his p.o. intake.  Weight at discharge is at 265 kg and respiratory status is stable.  Orthostatic symptoms have greatly improved and he is able to currently tolerate being out of bed for longer periods of time.  Unna boots were discontinued and thigh high TEDs use for BP support and edema control. Hypomagnesemia has resolved with supplementation. Heart rate has been controlled on coreg bid and amiodarone bid.   His endurance has been slowly improving and he is able to tolerate activity out of bed. He has made gains during his rehab stay and has progressed to min to max assist. Systolic BP has improved to 110-120 range and heart rate is stable with increase in activity. He was set up with [facility] as well as coupons to assist with medications and he plans on following up [facility] for primary care. He was discharged to home on 12/18/18 and will continue to receive follow up HHPT, HHOT and HHRN by [facility].    Rehab course: During patient's stay in rehab weekly  team conferences were held to monitor patient's progress, set goals and discuss barriers to discharge. At admission, patient required total assist with ADLs and max assist with mobility.  He  has had improvement in activity tolerance, balance, postural control as well as ability to compensate for deficits. He is able to complete ADL tasks with min to max assist. He requires min assist for SB transfers and is able to propel his power WC for 180' X 2 with supervision for safety. Family education was completed with wife and son regarding all aspects of care.    Disposition:  Home  Diet: Heart Healthy/Low salt.   Special Instructions: 1. Pressure relief measures. Boost every 20 minutes if in chair. 2. Elevate legs when seated. 3. Weight self daily and monitor for signs of overload.   Discharge Instructions    Ambulatory referral to Physical Medicine Rehab   Complete by:  As directed    Office will call you with follow up appointment     Allergies as of 12/18/2018   No Known Allergies     Medication List  STOP taking these medications   alum & mag hydroxide-simeth 200-200-20 MG/5ML suspension Commonly known as:  MAALOX/MYLANTA   ampicillin 2 g in sodium chloride 0.9 % 100 mL   losartan 25 MG tablet Commonly known as:  COZAAR   nicotine 14 mg/24hr patch Commonly known as:  NICODERM CQ - dosed in mg/24 hours     TAKE these medications   acetaminophen 325 MG tablet Commonly known as:  TYLENOL Take 1-2 tablets (325-650 mg total) by mouth every 4 (four) hours as needed for mild pain. What changed:    how much to take  reasons to take this   amiodarone 200 MG tablet Commonly known as:  PACERONE Take 1 tablet (200 mg total) by mouth 2 (two) times daily. What changed:  when to take this   apixaban 5 MG Tabs tablet Commonly known as:  ELIQUIS Take 1 tablet (5 mg total) by mouth 2 (two) times daily.   carvedilol 3.125 MG tablet Commonly known as:  COREG Take 1 tablet  (3.125 mg total) by mouth 2 (two) times daily with a meal. What changed:  when to take this   feeding supplement (PRO-STAT SUGAR FREE 64) Liqd Take 30 mLs by mouth 2 (two) times daily.   ferrous sulfate 325 (65 FE) MG tablet Take 1 tablet (325 mg total) by mouth 3 (three) times daily with meals.   folic acid 1 MG tablet Commonly known as:  FOLVITE Take 1 tablet (1 mg total) by mouth daily.   furosemide 20 MG tablet Commonly known as:  LASIX Take 1 tablet (20 mg total) by mouth daily. What changed:    medication strength  how much to take   magnesium oxide 400 (241.3 Mg) MG tablet Commonly known as:  MAG-OX Take 1 tablet (400 mg total) by mouth 2 (two) times daily.   midodrine 5 MG tablet Commonly known as:  PROAMATINE Take 1 tablet (5 mg total) by mouth 3 (three) times daily with meals. Take first dose before getting out of bed.   pantoprazole 40 MG tablet Commonly known as:  Protonix Take 1 tablet (40 mg total) by mouth 2 (two) times daily before a meal.   polyethylene glycol 17 g packet Commonly known as:  MIRALAX / GLYCOLAX Take 17 g by mouth 2 (two) times daily.   rosuvastatin 20 MG tablet Commonly known as:  CRESTOR Take 1 tablet (20 mg total) by mouth daily at 6 PM for 30 days.      Follow-up Information    Kirsteins, Victorino Sparrow, MD Follow up.   Specialty:  Physical Medicine and Rehabilitation Why:  office will call you with follow up appointment/Rehab Contact information: [address] [place] [address] [phone]        Loreli Slot, MD. Call on 12/21/2018.   Specialty:  Cardiothoracic Surgery Why:  for follow up appointment/empyema.  Contact information: [address] [place] [address] [phone]        Kerin Salen, MD. Call on 12/21/2018.   Specialty:  Gastroenterology Why:  for follow up appointment/GI bleed Contact information: [address] [place] [address] [phone]         Jodelle Red, MD. Call on 12/21/2018.   Specialty:  Cardiology Why:   for follow up appointment/Cardiac issues Contact information: [address] [place] [address] [place] [address] [phone]        [facility]. Call.   Specialty:  Family Medicine Why:  for post hospital follow up.  Contact information: [address] [place] [address] [phone]           Signed: Jacquelynn Cree 12/21/2018, 4:45 PM

## 2018-12-17 NOTE — Progress Notes (Signed)
[place] PHYSICAL MEDICINE & REHABILITATION PROGRESS NOTE   Subjective/Complaints:  MinA slide board with PT.  Family in yesterday and was able to transfer with with wife and son.  Did slide board and hoyer lift  ROS: Patient denies , nausea, vomiting, diarrhea, cough, shortness of breath or chest pain, Objective:   Ct Chest Wo Contrast  Result Date: 12/15/2018 CLINICAL DATA:  Follow-up empyema EXAM: CT CHEST WITHOUT CONTRAST TECHNIQUE: Multidetector CT imaging of the chest was performed following the standard protocol without IV contrast. COMPARISON:  CT from 11/12/2018, recent chest x-ray from 11/02/2018 FINDINGS: Cardiovascular: Thoracic aorta shows no significant atherosclerotic calcifications. No aneurysmal dilatation is seen. Mild cardiac enlargement is noted particularly related to biventricular enlargement. Coronary calcifications are seen. Mediastinum/Nodes: Thoracic inlet is within normal limits. Left-sided PICC line is seen cavoatrial junction. Stable mediastinal lymph nodes in the precarinal region are seen. Mild right hilar adenopathy is noted although incompletely evaluated due to lack of IV contrast. The esophagus is within normal limits. Lungs/Pleura: Mild left lower lobe atelectasis is noted stable from the prior exam. This may represent some focal scarring. Emphysematous changes are seen bilaterally. Right lower lobe atelectasis is seen. The previously seen empyema has reduced significantly with only a small air-fluid collection remaining. This is consistent with prior catheter based drainage. No new significant fluid collection is seen. Upper Abdomen: Visualized upper abdomen is unremarkable. Musculoskeletal: Mild degenerative changes of the thoracic spine are noted. IMPRESSION: Significant improvement in right-sided empyema. Mild bibasilar atelectasis is noted. No new focal abnormality is seen. Emphysema (ICD10-J43.9). Electronically Signed   By: Inez Catalina M.D.   On: 12/15/2018  13:15   Recent Labs    12/16/18 1106  WBC 5.4  HGB 9.6*  HCT 32.4*  PLT 252   Recent Labs    12/16/18 1106  NA 140  K 3.8  CL 105  CO2 25  GLUCOSE 95  BUN 24*  CREATININE 1.03  CALCIUM 8.9    Intake/Output Summary (Last 24 hours) at 12/17/2018 0839 Last data filed at 12/17/2018 4497 Gross per 24 hour  Intake 120 ml  Output 650 ml  Net -530 ml     Physical Exam: Vital Signs Blood pressure 118/85, pulse 77, temperature 98 F (36.7 C), resp. rate 17, height 6' 0.4" (1.839 m), weight 119.7 kg, SpO2 98 %. Constitutional: No distress . Vital signs reviewed. HEENT: EOMI, oral membranes moist Neck: supple Cardiovascular: RRR without murmur. No JVD    Respiratory: CTA Bilaterally without wheezes or rales. Normal effort, [place]   GI: BS +, non-tender, non-distended  Musc: Edema in bilateral feet Neurologic: Alert Motor: Bilateral upper extremities: 4/5 proximal distal Bilateral lower extremities:  Right lower extremity: Hip flexion 2/5, knee extension 2+/5, ankle dorsiflexion 3/5, stable Left lower extremity: Hip flexion 3-/5, knee extension 3/5, ankle dorsiflexion 4/5, stable Skin: Lower extremities and Unna boots Sensation reduced right thigh, anterior medial and lateral aspect including the right prepatellar area--stable Psych: pleasant  Assessment/Plan: 1. Functional deficits secondary to debility secondary to CHF and empyema which require 3+ hours per day of interdisciplinary therapy in a comprehensive inpatient rehab setting.  Physiatrist is providing close team supervision and 24 hour management of active medical problems listed below.  Physiatrist and rehab team continue to assess barriers to discharge/monitor patient progress toward functional and medical goals  Care Tool:  Bathing  Bathing activity did not occur: Refused Body parts bathed by patient: Right arm, Left arm, Chest, Abdomen, Face   Body parts  bathed by helper: Right lower leg, Left lower leg Body  parts n/a: Front perineal area, Buttocks   Bathing assist Assist Level: Minimal Assistance - Patient > 75%     Upper Body Dressing/Undressing Upper body dressing   What is the patient wearing?: Pull over shirt    Upper body assist Assist Level: Set up assist    Lower Body Dressing/Undressing Lower body dressing      What is the patient wearing?: Pants     Lower body assist Assist for lower body dressing: Maximal Assistance - Patient 25 - 49%     Toileting Toileting    Toileting assist Assist for toileting: Total Assistance - Patient < 25%     Transfers Chair/bed transfer  Transfers assist  Chair/bed transfer activity did not occur: Safety/medical concerns  Chair/bed transfer assist level: Minimal Assistance - Patient > 75%     Locomotion Ambulation   Ambulation assist   Ambulation activity did not occur: Safety/medical concerns          Walk 10 feet activity   Assist  Walk 10 feet activity did not occur: Safety/medical concerns        Walk 50 feet activity   Assist Walk 50 feet with 2 turns activity did not occur: Safety/medical concerns         Walk 150 feet activity   Assist Walk 150 feet activity did not occur: Safety/medical concerns         Walk 10 feet on uneven surface  activity   Assist Walk 10 feet on uneven surfaces activity did not occur: Safety/medical concerns         Wheelchair     Assist Will patient use wheelchair at discharge?: Yes Type of Wheelchair: Manual Wheelchair activity did not occur: Safety/medical concerns  Wheelchair assist level: Supervision/Verbal cueing Max wheelchair distance: 150    Wheelchair 50 feet with 2 turns activity    Assist    Wheelchair 50 feet with 2 turns activity did not occur: Safety/medical concerns   Assist Level: Supervision/Verbal cueing   Wheelchair 150 feet activity     Assist Wheelchair 150 feet activity did not occur: Safety/medical  concerns   Assist Level: Supervision/Verbal cueing    Medical Problem List and Plan: 1.  Deficits with mobility, transfers, endurance, weakness secondary to multifactorial debility.  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 Formulized individual treatment plan based on medical history, underlying problem and comorbidities. 2.  Antithrombotics: -DVT/anticoagulation:  Pharmaceutical: Other (comment)--Eliquis. Continue to monitor hemoglobin as well as signs of bleeding.             -antiplatelet therapy: N/A 3. Pain Management: Tylenol prn 4. Mood: LCSW to follow for evaluate              -antipsychotic agents: N/A 5. Neuropsych: This patient is capable of making decisions on his own behalf. 6. Skin/Wound Care: Routine pressure relief measures.  7. Fluids/Electrolytes/Nutrition: Monitor I/O. Continue ensure bid. I: 875m , O: 11578m  8. Empyema due to E coli: Continue ampicillin --Antibiotic pleated.  CT chest, shows resolution, small residual pleural effusion, Discussed with Dr HeRoxan Hockeyo need for follow-up with CVTS  9. ABLA due to GIB/spontaneous thigh hematoma: Continue protonix bid. Received 1 unit PRBC 4/24 and  feraheme 4/26.   Now on iron supplement.  Added folic acid due to low folate levels.    Hemoglobin 7.2 on 4/30, hemoglobin 7.6 on 5/7, now up to 8.2 on 5/11, no need for transfusion   10. ICM/Acute systolic CHF: Monitor daily weights.  Monitor for signs/symptoms of fluid overload.  BP remains soft--no ARB. Continue coreg and Crestor.  Filed Weights  No breathing issues, no chest pains  -continue to monitor  -oxygen at night, no CPAP right now 11. 3 vessel CAD: Not a surgical candidate-treated medically 12.  Paroxymal A Fib with RVR: Monitor HR bid-continue amiodarone, coreg and Eliquis. Cards recommends re-eval prior to discharge.  13. Bradycardia/tachy: rate controlled at present    Coreg 3.125  twice daily, still is off digoxin. amio 239m bid     -cardiology has seen pt 14. Protein calorie malnutrition: Continue to offer supplements tid. Magnesium supplemented yesterday due to low normal levels.  Added Mg supplement..  15. Orthostatic hypotension:Improved  .   -Thigh high TEDs.   18 femoral neuropathy resolving senation and hip flexion inproving    LOS: 22 days A FACE TO FEnnisE Kirsteins 12/17/2018, 8:39 AM

## 2018-12-17 NOTE — Progress Notes (Signed)
Occupational Therapy Session Note  Patient Details  Name: Aaron [name] MRN: [id number] Date of Birth: November 03, 1965  Today's Date: 12/17/2018 OT Individual Time: 1300-1345 OT Individual Time Calculation (min): 45 min    Short Term Goals: Week 3:  OT Short Term Goal 1 (Week 3): STGs=LTGs secondary to upcoming discharge  Skilled Therapeutic Interventions/Progress Updates:    OT intervention with focus on ongoing discharge planning, home safety, and BUE therex.  Pt completed chest presses, overhead presses, diagonals, and adduction therex with green theraband (3X10). Pt pleased with progress and happy to discharge tomorrow.   Therapy Documentation Precautions:  Precautions Precautions: Fall Precaution Comments: monitor BP, hypotensive in sitting/standing Required Braces or Orthoses: Other Brace Other Brace: RLE Bledsoe brace locked in extension when ambulating that can be d/c'd when pt can perform 10 SLRs without assist Restrictions Weight Bearing Restrictions: No RLE Weight Bearing: Weight bearing as tolerated LLE Weight Bearing: Weight bearing as tolerated Pain: Pain Assessment Pain Scale: 0-10 Pain Score: 0-No pain  Therapy/Group: Individual Therapy  Rich Brave 12/17/2018, 1:45 PM

## 2018-12-17 NOTE — Progress Notes (Signed)
Physical Therapy Discharge Summary  Patient Details  Name: Aaron [name] MRN: [id number] Date of Birth: 1966-03-06  Today's Date: 12/17/2018 PT Individual Time: 0802-0900 PT Individual Time Calculation (min): 58 min    Patient has met 7 of 9 long term goals due to improved activity tolerance, improved balance, increased strength, increased range of motion, ability to compensate for deficits, functional use of  right lower extremity, improved awareness and improved coordination.  Patient to discharge at a wheelchair level Supervision.   Patient's care partner is independent to provide the necessary physical assistance at discharge.   Reasons goals not met: Pt continues to demonstrate profound RLE weakness, limiting safety with sit<>stand and ambulation  Recommendation:  Patient will benefit from ongoing skilled PT services in home health setting to continue to advance safe functional mobility, address ongoing impairments in strength, balance, transfers, bed mobility, safety, gait, and minimize fall risk.  Equipment: Power WC, SB.   Reasons for discharge: treatment goals met and discharge from hospital  Patient/family agrees with progress made and goals achieved: Yes   PT treatment:  Pt received supine in bed and agreeable to PT. Rolling R and L with supervision assist and heavy  Use of bed rails for PT to don pants on pt. Supine>sit transfer with supervision assist and min cues for use fo UE to push to sitting.  SB transfer to power WC with min assist to stabilize board and RLE. PT instructed pt in Grad day assessment to measure progress toward goals. See below for details. Power WC mobility 2 x 114f. Supervision assist from PT with cues for safety in turns and awareness of obstacles while reversing. Pt returned to room and performed SB  transfer to bed with min assist from PT for safety. Sit>supine completed with min assist to control the RLE, and pt left supine in bed with call bell in reach  and all needs met.       PT Discharge  Vital Signs Therapy Vitals Temp: 98 F (36.7 C) Pulse Rate: 77 Resp: 17 BP: 118/85 Patient Position (if appropriate): Lying Oxygen Therapy SpO2: 98 % O2 Device: Room Air Pain Pain Assessment Pain Scale: 0-10 Pain Score: 0-No pain Vision/Perception  Perception Perception: Within Functional Limits Praxis Praxis: Intact  Cognition   WFL Sensation Sensation Light Touch: Impaired Detail Peripheral sensation comments: decreased appreciation to light touch due to nerve damage from hematoma .  Light Touch Impaired Details: Impaired RLE Proprioception: Appears Intact Coordination Gross Motor Movements are Fluid and Coordinated: No Fine Motor Movements are Fluid and Coordinated: Yes Coordination and Movement Description: poor coordination in the RLE from weakness  Heel Shin Test: unable to perform on the R Motor  Motor Motor: Other (comment) Motor - Discharge Observations: muscle paralysis/weakness in the R quad (improved from eval). generalized weakness  Mobility Bed Mobility Bed Mobility: Rolling Right;Rolling Left;Sit to Supine;Supine to Sit Rolling Right: Supervision/verbal cueing Rolling Left: Supervision/Verbal cueing Supine to Sit: Supervision/Verbal cueing Sit to Supine: Minimal Assistance - Patient > 75% Transfers Transfers: Lateral/Scoot Transfers Lateral/Scoot Transfers: Minimal Assistance - Patient > 75% Transfer (Assistive device): Other (Comment)(slide board ) Locomotion  Gait Ambulation: No Gait Gait: No Stairs / Additional Locomotion Stairs: No Wheelchair Mobility Wheelchair Mobility: Yes Wheelchair Assistance: SChartered loss adjuster Other (comment)(Power WC ) Wheelchair Parts Management: Supervision/cueing Distance: 180  Trunk/Postural Assessment  Cervical Assessment Cervical Assessment: Exceptions to WFL(forward head) Thoracic Assessment Thoracic Assessment: Exceptions to  WFL(rounded shoulders) Lumbar Assessment Lumbar Assessment: Exceptions to WFL(posterior pelvic tilt)  Postural Control Postural Control: Within Functional Limits  Balance Balance Balance Assessed: Yes Static Sitting Balance Static Sitting - Level of Assistance: 7: Independent Dynamic Sitting Balance Dynamic Sitting - Level of Assistance: 7: Independent Static Standing Balance Static Standing - Level of Assistance: Not tested (comment) Extremity Assessment      RLE Assessment RLE Assessment: Exceptions to [facility] Strength Comments: 3-/5 knee extension and hip flexion.  grossly 4-/5 all other movements.  LLE Assessment LLE Assessment: Exceptions to [facility] General Strength Comments: grossly 4-/5 proximal to distal     Lorie Phenix 12/17/2018, 9:01 AM

## 2018-12-17 NOTE — Progress Notes (Signed)
Restful throughout shift with no complaint or noted distress, denies pain, refer to assessment data sheet

## 2018-12-18 MED ORDER — ROSUVASTATIN CALCIUM 20 MG PO TABS: 20.0000 mg | ORAL_TABLET | Freq: Every day | ORAL | 1 refills | Status: AC

## 2018-12-18 MED ORDER — FOLIC ACID 1 MG PO TABS: 1.0000 mg | ORAL_TABLET | Freq: Every day | ORAL | 1 refills | Status: AC

## 2018-12-18 MED ORDER — MAGNESIUM OXIDE 400 (241.3 MG) MG PO TABS: 400.0000 mg | ORAL_TABLET | Freq: Two times a day (BID) | ORAL | 1 refills | Status: AC

## 2018-12-18 MED ORDER — MIDODRINE HCL 5 MG PO TABS: 5.0000 mg | ORAL_TABLET | Freq: Three times a day (TID) | ORAL | 1 refills | Status: AC

## 2018-12-18 MED ORDER — AMIODARONE HCL 200 MG PO TABS: 200.0000 mg | ORAL_TABLET | Freq: Two times a day (BID) | ORAL | 1 refills | Status: AC

## 2018-12-18 MED ORDER — POLYETHYLENE GLYCOL 3350 17 G PO PACK: 17.0000 g | PACK | Freq: Two times a day (BID) | ORAL | 0 refills | Status: AC

## 2018-12-18 MED ORDER — PANTOPRAZOLE SODIUM 40 MG PO TBEC: 40.0000 mg | DELAYED_RELEASE_TABLET | Freq: Two times a day (BID) | ORAL | 1 refills | Status: AC

## 2018-12-18 MED ORDER — CARVEDILOL 3.125 MG PO TABS: 3.1250 mg | ORAL_TABLET | Freq: Two times a day (BID) | ORAL | 1 refills | Status: AC

## 2018-12-18 MED ORDER — ACETAMINOPHEN 325 MG PO TABS: 325.0000 mg | ORAL_TABLET | ORAL | Status: AC | PRN

## 2018-12-18 MED ORDER — FERROUS SULFATE 325 (65 FE) MG PO TABS: 325.0000 mg | ORAL_TABLET | Freq: Three times a day (TID) | ORAL | 3 refills | Status: AC

## 2018-12-18 MED ORDER — APIXABAN 5 MG PO TABS: 5.0000 mg | ORAL_TABLET | Freq: Two times a day (BID) | ORAL | 1 refills | Status: AC

## 2018-12-18 MED ORDER — FUROSEMIDE 20 MG PO TABS: 20.0000 mg | ORAL_TABLET | Freq: Every day | ORAL | 1 refills | Status: AC

## 2018-12-18 MED ORDER — PRO-STAT SUGAR FREE PO LIQD: 30.0000 mL | Freq: Two times a day (BID) | ORAL | 0 refills | Status: AC

## 2018-12-18 MED ORDER — ROSUVASTATIN CALCIUM 20 MG PO TABS: 20.0000 mg | ORAL_TABLET | Freq: Every day | ORAL | 0 refills | Status: DC

## 2018-12-18 NOTE — Progress Notes (Signed)
[place] PHYSICAL MEDICINE & REHABILITATION PROGRESS NOTE   Subjective/Complaints:  Feels ready for d/c  Discussed PMR f/u and possible need for EMG  ROS: Patient denies , nausea, vomiting, diarrhea, cough, shortness of breath or chest pain, Objective:   No results found. Recent Labs    12/16/18 1106  WBC 5.4  HGB 9.6*  HCT 32.4*  PLT 252   Recent Labs    12/16/18 1106  NA 140  K 3.8  CL 105  CO2 25  GLUCOSE 95  BUN 24*  CREATININE 1.03  CALCIUM 8.9    Intake/Output Summary (Last 24 hours) at 12/18/2018 0809 Last data filed at 12/18/2018 4315 Gross per 24 hour  Intake 480 ml  Output 1350 ml  Net -870 ml     Physical Exam: Vital Signs Blood pressure 123/86, pulse 79, temperature 97.6 F (36.4 C), resp. rate 18, height 6' 0.4" (1.839 m), weight 129.7 kg, SpO2 96 %. Constitutional: No distress . Vital signs reviewed. HEENT: EOMI, oral membranes moist Neck: supple Cardiovascular: RRR without murmur. No JVD    Respiratory: CTA Bilaterally without wheezes or rales. Normal effort, [place]   GI: BS +, non-tender, non-distended  Musc: Edema in bilateral feet Neurologic: Alert Motor: Bilateral upper extremities: 4/5 proximal distal Bilateral lower extremities:  Right lower extremity: Hip flexion 2/5, knee extension 2+/5, ankle dorsiflexion 3/5, stable Left lower extremity: Hip flexion 3-/5, knee extension 3/5, ankle dorsiflexion 4/5, stable Skin: Lower extremities and Unna boots Sensation reduced right thigh, anterior medial and lateral aspect including the right prepatellar area--stable Psych: pleasant  Assessment/Plan:  1. Functional deficits secondary to debility secondary to CHF Stable for D/C today F/u PCP in 3-4 weeks F/u PM&R 2 weeks See D/C summary  See D/C instructions  Care Tool:  Bathing  Bathing activity did not occur: Refused Body parts bathed by patient: Right arm, Left arm, Chest, Abdomen, Face, Front perineal area   Body parts bathed by  helper: Buttocks, Right upper leg, Left upper leg, Right lower leg, Left lower leg Body parts n/a: Front perineal area, Buttocks   Bathing assist Assist Level: Moderate Assistance - Patient 50 - 74%(per staff report)     Upper Body Dressing/Undressing Upper body dressing   What is the patient wearing?: Pull over shirt    Upper body assist Assist Level: Set up assist    Lower Body Dressing/Undressing Lower body dressing      What is the patient wearing?: Pants     Lower body assist Assist for lower body dressing: Maximal Assistance - Patient 25 - 49%     Toileting Toileting    Toileting assist Assist for toileting: Maximal Assistance - Patient 25 - 49%     Transfers Chair/bed transfer  Transfers assist  Chair/bed transfer activity did not occur: Safety/medical concerns  Chair/bed transfer assist level: Minimal Assistance - Patient > 75%     Locomotion Ambulation   Ambulation assist   Ambulation activity did not occur: Safety/medical concerns          Walk 10 feet activity   Assist  Walk 10 feet activity did not occur: Safety/medical concerns        Walk 50 feet activity   Assist Walk 50 feet with 2 turns activity did not occur: Safety/medical concerns         Walk 150 feet activity   Assist Walk 150 feet activity did not occur: Safety/medical concerns         Walk 10 feet on  uneven surface  activity   Assist Walk 10 feet on uneven surfaces activity did not occur: Safety/medical concerns         Wheelchair     Assist Will patient use wheelchair at discharge?: Yes Type of Wheelchair: Manual Wheelchair activity did not occur: Safety/medical concerns  Wheelchair assist level: Supervision/Verbal cueing Max wheelchair distance: 180    Wheelchair 50 feet with 2 turns activity    Assist    Wheelchair 50 feet with 2 turns activity did not occur: Safety/medical concerns   Assist Level: Supervision/Verbal cueing    Wheelchair 150 feet activity     Assist Wheelchair 150 feet activity did not occur: Safety/medical concerns   Assist Level: Supervision/Verbal cueing    Medical Problem List and Plan: 1.  Deficits with mobility, transfers, endurance, weakness secondary to multifactorial debility.  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 2.  Antithrombotics: -DVT/anticoagulation:  Pharmaceutical: Other (comment)--Eliquis. Continue to monitor hemoglobin as well as signs of bleeding.             -antiplatelet therapy: N/A 3. Pain Management: Tylenol prn 4. Mood: LCSW to follow for evaluate              -antipsychotic agents: N/A 5. Neuropsych: This patient is capable of making decisions on his own behalf. 6. Skin/Wound Care: Routine pressure relief measures.  7. Fluids/Electrolytes/Nutrition: Monitor I/O. Continue ensure bid. I: , O:   8. Empyema due to E coli: Continue ampicillin --Antibiotic pleated.  CT chest, shows resolution, small residual pleural effusion, Discussed with Dr Dorris Fetch no need for follow-up with CVTS  9. ABLA due to GIB/spontaneous thigh hematoma: Continue protonix bid. Received 1 unit PRBC 4/24 and feraheme 4/26.   Now on iron supplement.  Added folic acid due to low folate levels.    Hemoglobin 7.2 on 4/30, hemoglobin 7.6 on 5/7, now up to 9.6  on 5/20, no need for transfusion   10. ICM/Acute systolic CHF: Monitor daily weights.  Monitor for signs/symptoms of fluid overload.  BP remains soft--no ARB. Continue coreg and Crestor.  Filed Weights   12/18/18 0514  Weight: 129.7 kg  No breathing issues, no chest pains  -continue to monitor  -oxygen at night, no CPAP right now 11. 3 vessel CAD: Not a surgical candidate-treated medically 12. Paroxymal A Fib with RVR: Monitor HR bid-continue amiodarone, coreg and Eliquis. Cards  recommends re-eval prior to discharge.  13. Bradycardia/tachy: rate controlled at present    Coreg 3.125  twice daily, still is off digoxin. amio 200mg  bid     -cardiology has seen pt 14. Protein calorie malnutrition: Continue to offer supplements tid. Magnesium supplemented yesterday due to low normal levels.  Added Mg supplement..  15. Orthostatic hypotension:Improved  .   -Thigh high TEDs.   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

## 2018-12-18 NOTE — Progress Notes (Signed)
Patient discharged home during shift. No further questions noted. Kalman Shan, LPN

## 2018-12-18 NOTE — Plan of Care (Signed)
  Problem: RH BOWEL ELIMINATION Goal: RH STG MANAGE BOWEL WITH ASSISTANCE Description STG Manage Bowel with Assistance. Mod  Outcome: Completed/Met   Problem: RH BLADDER ELIMINATION Goal: RH STG MANAGE BLADDER WITH ASSISTANCE Description STG Manage Bladder With Assistance. Min  Outcome: Completed/Met   Problem: RH SKIN INTEGRITY Goal: RH STG SKIN FREE OF INFECTION/BREAKDOWN Description Free of skin breakdown, infection with mod assist  Outcome: Completed/Met   Problem: RH PAIN MANAGEMENT Goal: RH STG PAIN MANAGED AT OR BELOW PT'S PAIN GOAL Description No pain or less than 3  Outcome: Completed/Met

## 2018-12-18 NOTE — Progress Notes (Signed)
Social Work  Discharge Note  The overall goal for the admission was met for:   Discharge location: Yes-HOME WITH WIFE AND SON WHO CAN PROVIDE 24 HR CARE  Length of Stay: Yes-23 DAYS  Discharge activity level: Yes-MIN-MOD WHEELCHAIR LEVEL  Home/community participation: Yes  Services provided included: MD, RD, PT, OT, RN, CM, TR, Pharmacy, Neuropsych and SW  Financial Services: Other: PENDING MEDICAID  Follow-up services arranged: Home Health: [facility]-PT,OT,RN, DME: [facility], [facility] BEDSIDE [facility], [facility] 3O TRANSFER BOARD and Patient/Family has no preference for HH/DME agencies  Comments (or additional information):wIFE AND SON WERE HERE FOR EDUCATION AND IT WENT WELL, Amorita. MATCH GIVEN TO ASSIST WITH MEDICATIONS AND PT TO GO HOME VIA [facility] DUE TO WIFE WORKS FOR THEM.  Patient/Family verbalized understanding of follow-up arrangements: Yes  Individual responsible for coordination of the follow-up plan: SELF & PAMELA-WIFE  Confirmed correct DME delivered: Elease Hashimoto 12/18/2018    Elease Hashimoto

## 2018-12-18 NOTE — Discharge Instructions (Signed)
Inpatient Rehab Discharge Instructions  Aaron [name] Discharge date and time:  12/18/18  Activities/Precautions/ Functional Status: Activity: no lifting, driving, or strenuous exercise till cleared by MD Diet: cardiac diet Wound Care: keep wound clean and dry    Functional status:  ___ No restrictions     ___ Walk up steps independently _X__ 24/7 supervision/assistance   ___ Walk up steps with assistance ___ Intermittent supervision/assistance  ___ Bathe/dress independently ___ Walk with walker     _X__ Bathe/dress with assistance ___ Walk Independently    ___ Shower independently ___ Walk with assistance    ___ Shower with assistance _X__ No alcohol     ___ Return to work/school ________   Medical Equipment/Items Ordered:HOSPITAL BED, HOYER LIFT, WIDE DROP-ARM BEDSIDE COMMODE, 30 TRANSFER BOARD WHEELCHAIR VIA JOSH-LOANER  Agency/Supplier:[facility]   [phone]  Other:[place] AND MEDICAID APPLICATIONS PENDING COMMUNITY REFERRALS UPON DISCHARGE:    Home Health:   PT, OT, RN  Agency:[facility]   Phone:[phone]  Date of last service:12/18/2018   Special Instructions: 1. Need to  Monitor weights daily. Contact cardiology for increase in swelling or shortness of breath. See instructions below. 2. Monitor for any signs of bleeding. See precautions below.    Heart Failure Heart failure is a condition in which the heart has trouble pumping blood because it has become weak or stiff. This means that the heart does not pump blood efficiently for the body to work well. For some people with heart failure, fluid may back up into the lungs and there may be swelling (edema) in the lower legs. Heart failure is usually a long-term (chronic) condition. It is important for you to take good care of yourself and follow the treatment plan from your health care provider. What are the causes? This condition is caused by some health problems, including:  High blood pressure (hypertension).  Hypertension causes the heart muscle to work harder than normal. High blood pressure eventually causes the heart to become stiff and weak.  Coronary artery disease (CAD). CAD is the buildup of cholesterol and fat (plaques) in the arteries of the heart.  Heart attack (myocardial infarction). Injured tissue, which is caused by the heart attack, does not contract as well and the heart's ability to pump blood is weakened.  Abnormal heart valves. When the heart valves do not open and close properly, the heart muscle must pump harder to keep the blood flowing.  Heart muscle disease (cardiomyopathy or myocarditis). Heart muscle disease is damage to the heart muscle from a variety of causes, such as drug or alcohol abuse, infections, or unknown causes. These can increase the risk of heart failure.  Lung disease. When the lungs do not work properly, the heart must work harder. What increases the risk? Risk of heart failure increases as a person ages. This condition is also more likely to develop in people who:  Are overweight.  Are male.  Smoke or chew tobacco.  Abuse alcohol or illegal drugs.  Have taken medicines that can damage the heart, such as chemotherapy drugs.  Have diabetes. ? High blood sugar (glucose) is associated with high fat (lipid) levels in the blood. ? Diabetes can also damage tiny blood vessels that carry nutrients to the heart muscle.  Have abnormal heart rhythms.  Have thyroid problems.  Have low blood counts (anemia). What are the signs or symptoms? Symptoms of this condition include:  Shortness of breath with activity, such as when climbing stairs.  Persistent cough.  Swelling of the  feet, ankles, legs, or abdomen.  Unexplained weight gain.  Difficulty breathing when lying flat (orthopnea).  Waking from sleep because of the need to sit up and get more air.  Rapid heartbeat.  Fatigue and loss of energy.  Feeling light-headed, dizzy, or close to  fainting.  Loss of appetite.  Nausea.  Increased urination during the night (nocturia).  Confusion. How is this diagnosed? This condition is diagnosed based on:  Medical history, symptoms, and a physical exam.  Diagnostic tests, which may include: ? Echocardiogram. ? Electrocardiogram (ECG). ? Chest X-ray. ? Blood tests. ? Exercise stress test. ? Radionuclide scans. ? Cardiac catheterization and angiogram. How is this treated? Treatment for this condition is aimed at managing the symptoms of heart failure. Medicines, behavioral changes, or other treatments may be necessary to treat heart failure. Medicines These may include:  Angiotensin-converting enzyme (ACE) inhibitors. This type of medicine blocks the effects of a blood protein called angiotensin-converting enzyme. ACE inhibitors relax (dilate) the blood vessels and help to lower blood pressure.  Angiotensin receptor blockers (ARBs). This type of medicine blocks the actions of a blood protein called angiotensin. ARBs dilate the blood vessels and help to lower blood pressure.  Water pills (diuretics). Diuretics cause the kidneys to remove salt and water from the blood. The extra fluid is removed through urination, leaving a lower volume of blood that the heart has to pump.  Beta blockers. These improve heart muscle strength and they prevent the heart from beating too quickly.  Digoxin. This increases the force of the heartbeat. Healthy behavior changes These may include:  Reaching and maintaining a healthy weight.  Stopping smoking or chewing tobacco.  Eating heart-healthy foods.  Limiting or avoiding alcohol.  Stopping use of street drugs (illegal drugs).  Physical activity. Other treatments These may include:  Surgery to open blocked coronary arteries or repair damaged heart valves.  Placement of a biventricular pacemaker to improve heart muscle function (cardiac resynchronization therapy). This device  paces both the right ventricle and left ventricle.  Placement of a device to treat serious abnormal heart rhythms (implantable cardioverter defibrillator, or ICD).  Placement of a device to improve the pumping ability of the heart (left ventricular assist device, or LVAD).  Heart transplant. This can cure heart failure, and it is considered for certain patients who do not improve with other therapies. Follow these instructions at home: Medicines  Take over-the-counter and prescription medicines only as told by your health care provider. Medicines are important in reducing the workload of your heart, slowing the progression of heart failure, and improving your symptoms. ? Do not stop taking your medicine unless your health care provider told you to do that. ? Do not skip any dose of medicine. ? Refill your prescriptions before you run out of medicine. You need your medicines every day. Eating and drinking   Eat heart-healthy foods. Talk with a dietitian to make an eating plan that is right for you. ? Choose foods that contain no trans fat and are low in saturated fat and cholesterol. Healthy choices include fresh or frozen fruits and vegetables, fish, lean meats, legumes, fat-free or low-fat dairy products, and whole-grain or high-fiber foods. ? Limit salt (sodium) if directed by your health care provider. Sodium restriction may reduce symptoms of heart failure. Ask a dietitian to recommend heart-healthy seasonings. ? Use healthy cooking methods instead of frying. Healthy methods include roasting, grilling, broiling, baking, poaching, steaming, and stir-frying.  Limit your fluid intake if directed by  your health care provider. Fluid restriction may reduce symptoms of heart failure. Lifestyle   Stop smoking or using chewing tobacco. Nicotine and tobacco can damage your heart and your blood vessels. Do not use nicotine gum or patches before talking to your health care provider.  Limit  alcohol intake to no more than 1 drink per day for non-pregnant women and 2 drinks per day for men. One drink equals 12 oz of beer, 5 oz of wine, or 1 oz of hard liquor. ? Drinking more than that is harmful to your heart. Tell your health care provider if you drink alcohol several times a week. ? Talk with your health care provider about whether any level of alcohol use is safe for you. ? If your heart has already been damaged by alcohol or you have severe heart failure, drinking alcohol should be stopped completely.  Stop use of illegal drugs.  Lose weight if directed by your health care provider. Weight loss may reduce symptoms of heart failure.  Do moderate physical activity if directed by your health care provider. People who are elderly and people with severe heart failure should consult with a health care provider for physical activity recommendations. Monitor important information   Weigh yourself every day. Keeping track of your weight daily helps you to notice excess fluid sooner. ? Weigh yourself every morning after you urinate and before you eat breakfast. ? Wear the same amount of clothing each time you weigh yourself. ? Record your daily weight. Provide your health care provider with your weight record.  Monitor and record your blood pressure as told by your health care provider.  Check your pulse as told by your health care provider. Dealing with extreme temperatures  If the weather is extremely hot: ? Avoid vigorous physical activity. ? Use air conditioning or fans or seek a cooler location. ? Avoid caffeine and alcohol. ? Wear loose-fitting, lightweight, and light-colored clothing.  If the weather is extremely cold: ? Avoid vigorous physical activity. ? Layer your clothes. ? Wear mittens or gloves, a hat, and a scarf when you go outside. ? Avoid alcohol. General instructions  Manage other health conditions such as hypertension, diabetes, thyroid disease, or  abnormal heart rhythms as told by your health care provider.  Learn to manage stress. If you need help to do this, ask your health care provider.  Plan rest periods when fatigued.  Get ongoing education and support as needed.  Participate in or seek rehabilitation as needed to maintain or improve independence and quality of life.  Stay up to date with immunizations. Keeping current on pneumococcal and influenza immunizations is especially important to prevent respiratory infections.  Keep all follow-up visits as told by your health care provider. This is important. Contact a health care provider if:  You have a rapid weight gain.  You have increasing shortness of breath that is unusual for you.  You are unable to participate in your usual physical activities.  You tire easily.  You cough more than normal, especially with physical activity.  You have any swelling or more swelling in areas such as your hands, feet, ankles, or abdomen.  You are unable to sleep because it is hard to breathe.  You feel like your heart is beating quickly (palpitations).  You become dizzy or light-headed when you stand up. Get help right away if:  You have difficulty breathing.  You notice or your family notices a change in your awareness, such as having trouble staying  awake or having difficulty with concentration.  You have pain or discomfort in your chest.  You have an episode of fainting (syncope). This information is not intended to replace advice given to you by your health care provider. Make sure you discuss any questions you have with your health care provider. Document Released: 07/15/2005 Document Revised: 06/13/2017 Document Reviewed: 02/07/2016 Elsevier Interactive Patient Education  2019 [employer]   Apixaban oral tablets What is this medicine? APIXABAN (a PIX a ban) is an anticoagulant (blood thinner). It is used to lower the chance of stroke in people with a medical  condition called atrial fibrillation. It is also used to treat or prevent blood clots in the lungs or in the veins. This medicine may be used for other purposes; ask your health care provider or pharmacist if you have questions. COMMON BRAND NAME(S): Eliquis What should I tell my health care provider before I take this medicine? They need to know if you have any of these conditions: -bleeding disorders -bleeding in the brain -blood in your stools (black or tarry stools) or if you have blood in your vomit -history of stomach bleeding -kidney disease -liver disease -mechanical heart valve -an unusual or allergic reaction to apixaban, other medicines, foods, dyes, or preservatives -pregnant or trying to get pregnant -breast-feeding How should I use this medicine? Take this medicine by mouth with a glass of water. Follow the directions on the prescription label. You can take it with or without food. If it upsets your stomach, take it with food. Take your medicine at regular intervals. Do not take it more often than directed. Do not stop taking except on your doctor's advice. Stopping this medicine may increase your risk of a blood clot. Be sure to refill your prescription before you run out of medicine. Talk to your pediatrician regarding the use of this medicine in children. Special care may be needed. Overdosage: If you think you have taken too much of this medicine contact a poison control center or emergency room at once. NOTE: This medicine is only for you. Do not share this medicine with others. What if I miss a dose? If you miss a dose, take it as soon as you can. If it is almost time for your next dose, take only that dose. Do not take double or extra doses. What may interact with this medicine? This medicine may interact with the following: -aspirin and aspirin-like medicines -certain medicines for fungal infections like ketoconazole and itraconazole -certain medicines for seizures  like carbamazepine and phenytoin -certain medicines that treat or prevent blood clots like warfarin, enoxaparin, and dalteparin -clarithromycin -NSAIDs, medicines for pain and inflammation, like ibuprofen or naproxen -rifampin -ritonavir -St. John's wort This list may not describe all possible interactions. Give your health care provider a list of all the medicines, herbs, non-prescription drugs, or dietary supplements you use. Also tell them if you smoke, drink alcohol, or use illegal drugs. Some items may interact with your medicine. What should I watch for while using this medicine? Visit your healthcare professional for regular checks on your progress. You may need blood work done while you are taking this medicine. Your condition will be monitored carefully while you are receiving this medicine. It is important not to miss any appointments. Avoid sports and activities that might cause injury while you are using this medicine. Severe falls or injuries can cause unseen bleeding. Be careful when using sharp tools or knives. Consider using an Neurosurgeon. Take special care  brushing or flossing your teeth. Report any injuries, bruising, or red spots on the skin to your healthcare professional. If you are going to need surgery or other procedure, tell your healthcare professional that you are taking this medicine. Wear a medical ID bracelet or chain. Carry a card that describes your disease and details of your medicine and dosage times. What side effects may I notice from receiving this medicine? Side effects that you should report to your doctor or health care professional as soon as possible: -allergic reactions like skin rash, itching or hives, swelling of the face, lips, or tongue -signs and symptoms of bleeding such as bloody or black, tarry stools; red or dark-brown urine; spitting up blood or brown material that looks like coffee grounds; red spots on the skin; unusual bruising or bleeding  from the eye, gums, or nose -signs and symptoms of a blood clot such as chest pain; shortness of breath; pain, swelling, or warmth in the leg -signs and symptoms of a stroke such as changes in vision; confusion; trouble speaking or understanding; severe headaches; sudden numbness or weakness of the face, arm or leg; trouble walking; dizziness; loss of coordination This list may not describe all possible side effects. Call your doctor for medical advice about side effects. You may report side effects to FDA at 1-800-FDA-1088. Where should I keep my medicine? Keep out of the reach of children. Store at room temperature between 20 and 25 degrees C (68 and 77 degrees F). Throw away any unused medicine after the expiration date. NOTE: This sheet is a summary. It may not cover all possible information. If you have questions about this medicine, talk to your doctor, pharmacist, or health care provider.  2019 Elsevier/Gold Standard (2017-07-10 11:20:07)  My questions have been answered and I understand these instructions. I will adhere to these goals and the provided educational materials after my discharge from the hospital.  Patient/Caregiver Signature _______________________________ Date __________  Clinician Signature _______________________________________ Date __________  Please bring this form and your medication list with you to all your follow-up doctor's appointments.

## 2019-01-08 ENCOUNTER — Encounter: Payer: Self-pay | Attending: Physical Medicine & Rehabilitation | Admitting: Physical Medicine & Rehabilitation

## 2019-01-19 ENCOUNTER — Telehealth: Payer: Self-pay | Admitting: Physician Assistant

## 2019-02-16 ENCOUNTER — Inpatient Hospital Stay (HOSPITAL_COMMUNITY)
Admission: EM | Admit: 2019-02-16 | Discharge: 2019-02-27 | DRG: 871 | Disposition: E | Payer: Medicaid Other | Attending: Internal Medicine | Admitting: Internal Medicine

## 2019-02-16 ENCOUNTER — Emergency Department (HOSPITAL_COMMUNITY): Payer: Medicaid Other

## 2019-02-16 DIAGNOSIS — Z993 Dependence on wheelchair: Secondary | ICD-10-CM | POA: Diagnosis not present

## 2019-02-16 DIAGNOSIS — R0989 Other specified symptoms and signs involving the circulatory and respiratory systems: Secondary | ICD-10-CM | POA: Diagnosis not present

## 2019-02-16 DIAGNOSIS — U071 COVID-19: Secondary | ICD-10-CM

## 2019-02-16 DIAGNOSIS — E785 Hyperlipidemia, unspecified: Secondary | ICD-10-CM | POA: Diagnosis present

## 2019-02-16 DIAGNOSIS — A419 Sepsis, unspecified organism: Secondary | ICD-10-CM

## 2019-02-16 DIAGNOSIS — Z452 Encounter for adjustment and management of vascular access device: Secondary | ICD-10-CM

## 2019-02-16 DIAGNOSIS — G9341 Metabolic encephalopathy: Secondary | ICD-10-CM | POA: Diagnosis not present

## 2019-02-16 DIAGNOSIS — I517 Cardiomegaly: Secondary | ICD-10-CM | POA: Diagnosis not present

## 2019-02-16 DIAGNOSIS — I110 Hypertensive heart disease with heart failure: Secondary | ICD-10-CM | POA: Diagnosis present

## 2019-02-16 DIAGNOSIS — R402312 Coma scale, best motor response, none, at arrival to emergency department: Secondary | ICD-10-CM | POA: Diagnosis present

## 2019-02-16 DIAGNOSIS — I5022 Chronic systolic (congestive) heart failure: Secondary | ICD-10-CM | POA: Diagnosis present

## 2019-02-16 DIAGNOSIS — E43 Unspecified severe protein-calorie malnutrition: Secondary | ICD-10-CM | POA: Diagnosis not present

## 2019-02-16 DIAGNOSIS — J988 Other specified respiratory disorders: Secondary | ICD-10-CM

## 2019-02-16 DIAGNOSIS — R4182 Altered mental status, unspecified: Secondary | ICD-10-CM | POA: Diagnosis not present

## 2019-02-16 DIAGNOSIS — I255 Ischemic cardiomyopathy: Secondary | ICD-10-CM | POA: Diagnosis present

## 2019-02-16 DIAGNOSIS — E872 Acidosis: Secondary | ICD-10-CM | POA: Diagnosis not present

## 2019-02-16 DIAGNOSIS — Z7901 Long term (current) use of anticoagulants: Secondary | ICD-10-CM

## 2019-02-16 DIAGNOSIS — R9431 Abnormal electrocardiogram [ECG] [EKG]: Secondary | ICD-10-CM | POA: Diagnosis not present

## 2019-02-16 DIAGNOSIS — R402212 Coma scale, best verbal response, none, at arrival to emergency department: Secondary | ICD-10-CM | POA: Diagnosis not present

## 2019-02-16 DIAGNOSIS — E876 Hypokalemia: Secondary | ICD-10-CM | POA: Diagnosis present

## 2019-02-16 DIAGNOSIS — I2510 Atherosclerotic heart disease of native coronary artery without angina pectoris: Secondary | ICD-10-CM | POA: Diagnosis present

## 2019-02-16 DIAGNOSIS — I499 Cardiac arrhythmia, unspecified: Secondary | ICD-10-CM | POA: Diagnosis not present

## 2019-02-16 DIAGNOSIS — Z66 Do not resuscitate: Secondary | ICD-10-CM | POA: Diagnosis present

## 2019-02-16 DIAGNOSIS — J9690 Respiratory failure, unspecified, unspecified whether with hypoxia or hypercapnia: Secondary | ICD-10-CM

## 2019-02-16 DIAGNOSIS — Z4682 Encounter for fitting and adjustment of non-vascular catheter: Secondary | ICD-10-CM | POA: Diagnosis not present

## 2019-02-16 DIAGNOSIS — Z79899 Other long term (current) drug therapy: Secondary | ICD-10-CM

## 2019-02-16 DIAGNOSIS — I480 Paroxysmal atrial fibrillation: Secondary | ICD-10-CM | POA: Diagnosis present

## 2019-02-16 DIAGNOSIS — A4189 Other specified sepsis: Secondary | ICD-10-CM | POA: Diagnosis not present

## 2019-02-16 DIAGNOSIS — I469 Cardiac arrest, cause unspecified: Secondary | ICD-10-CM | POA: Diagnosis present

## 2019-02-16 DIAGNOSIS — R6521 Severe sepsis with septic shock: Secondary | ICD-10-CM | POA: Diagnosis not present

## 2019-02-16 DIAGNOSIS — R402112 Coma scale, eyes open, never, at arrival to emergency department: Secondary | ICD-10-CM | POA: Diagnosis not present

## 2019-02-16 DIAGNOSIS — J9811 Atelectasis: Secondary | ICD-10-CM | POA: Diagnosis not present

## 2019-02-16 DIAGNOSIS — J869 Pyothorax without fistula: Secondary | ICD-10-CM | POA: Diagnosis not present

## 2019-02-16 DIAGNOSIS — Z955 Presence of coronary angioplasty implant and graft: Secondary | ICD-10-CM

## 2019-02-16 DIAGNOSIS — R569 Unspecified convulsions: Secondary | ICD-10-CM | POA: Diagnosis not present

## 2019-02-16 DIAGNOSIS — Z6837 Body mass index (BMI) 37.0-37.9, adult: Secondary | ICD-10-CM

## 2019-02-16 DIAGNOSIS — J9601 Acute respiratory failure with hypoxia: Secondary | ICD-10-CM | POA: Diagnosis present

## 2019-02-16 DIAGNOSIS — N179 Acute kidney failure, unspecified: Secondary | ICD-10-CM | POA: Diagnosis not present

## 2019-02-16 DIAGNOSIS — Z978 Presence of other specified devices: Secondary | ICD-10-CM | POA: Diagnosis not present

## 2019-02-16 DIAGNOSIS — R0689 Other abnormalities of breathing: Secondary | ICD-10-CM | POA: Diagnosis not present

## 2019-02-16 DIAGNOSIS — R404 Transient alteration of awareness: Secondary | ICD-10-CM | POA: Diagnosis not present

## 2019-02-16 DIAGNOSIS — Z4659 Encounter for fitting and adjustment of other gastrointestinal appliance and device: Secondary | ICD-10-CM

## 2019-02-16 DIAGNOSIS — I468 Cardiac arrest due to other underlying condition: Secondary | ICD-10-CM | POA: Diagnosis present

## 2019-02-16 LAB — CBC WITH DIFFERENTIAL/PLATELET
Abs Immature Granulocytes: 0.18 10*3/uL — ABNORMAL HIGH (ref 0.00–0.07)
Basophils Absolute: 0 10*3/uL (ref 0.0–0.1)
Basophils Relative: 0 %
Eosinophils Absolute: 0 10*3/uL (ref 0.0–0.5)
Eosinophils Relative: 0 %
HCT: 33.1 % — ABNORMAL LOW (ref 39.0–52.0)
Hemoglobin: 10.7 g/dL — ABNORMAL LOW (ref 13.0–17.0)
Immature Granulocytes: 6 %
Lymphocytes Relative: 18 %
Lymphs Abs: 0.5 10*3/uL — ABNORMAL LOW (ref 0.7–4.0)
MCH: 30.5 pg (ref 26.0–34.0)
MCHC: 32.3 g/dL (ref 30.0–36.0)
MCV: 94.3 fL (ref 80.0–100.0)
Monocytes Absolute: 0.2 10*3/uL (ref 0.1–1.0)
Monocytes Relative: 6 %
Neutro Abs: 2.1 10*3/uL (ref 1.7–7.7)
Neutrophils Relative %: 70 %
Platelets: 156 10*3/uL (ref 150–400)
RBC: 3.51 MIL/uL — ABNORMAL LOW (ref 4.22–5.81)
RDW: 14.1 % (ref 11.5–15.5)
WBC Morphology: INCREASED
WBC: 3 10*3/uL — ABNORMAL LOW (ref 4.0–10.5)
nRBC: 0 % (ref 0.0–0.2)

## 2019-02-16 LAB — URINALYSIS, ROUTINE W REFLEX MICROSCOPIC
Bilirubin Urine: NEGATIVE
Glucose, UA: 50 mg/dL — AB
Ketones, ur: NEGATIVE mg/dL
Leukocytes,Ua: NEGATIVE
Nitrite: NEGATIVE
Protein, ur: 30 mg/dL — AB
RBC / HPF: >50 RBC/hpf — ABNORMAL HIGH (ref 0–5)
Specific Gravity, Urine: 1.01 (ref 1.005–1.030)
pH: 6 (ref 5.0–8.0)

## 2019-02-16 LAB — POCT I-STAT 7, (LYTES, BLD GAS, ICA,H+H)
Acid-base deficit: 1 mmol/L (ref 0.0–2.0)
Bicarbonate: 23.7 mmol/L (ref 20.0–28.0)
Calcium, Ion: 1.15 mmol/L (ref 1.15–1.40)
HCT: 29 % — ABNORMAL LOW (ref 39.0–52.0)
Hemoglobin: 9.9 g/dL — ABNORMAL LOW (ref 13.0–17.0)
O2 Saturation: 100 %
Patient temperature: 101.2
Potassium: 2.6 mmol/L — CL (ref 3.5–5.1)
Sodium: 137 mmol/L (ref 135–145)
TCO2: 25 mmol/L (ref 22–32)
pCO2 arterial: 39.6 mmHg (ref 32.0–48.0)
pH, Arterial: 7.391 (ref 7.350–7.450)
pO2, Arterial: 224 mmHg — ABNORMAL HIGH (ref 83.0–108.0)

## 2019-02-16 LAB — POC OCCULT BLOOD, ED: Fecal Occult Bld: NEGATIVE

## 2019-02-16 LAB — BASIC METABOLIC PANEL
Anion gap: 14 (ref 5–15)
BUN: 23 mg/dL — ABNORMAL HIGH (ref 6–20)
CO2: 18 mmol/L — ABNORMAL LOW (ref 22–32)
Calcium: 7.8 mg/dL — ABNORMAL LOW (ref 8.9–10.3)
Chloride: 104 mmol/L (ref 98–111)
Creatinine, Ser: 1.5 mg/dL — ABNORMAL HIGH (ref 0.61–1.24)
GFR calc Af Amer: 60 mL/min — ABNORMAL LOW (ref >60)
GFR calc non Af Amer: 52 mL/min — ABNORMAL LOW (ref >60)
Glucose, Bld: 161 mg/dL — ABNORMAL HIGH (ref 70–99)
Potassium: 2.9 mmol/L — ABNORMAL LOW (ref 3.5–5.1)
Sodium: 136 mmol/L (ref 135–145)

## 2019-02-16 LAB — COMPREHENSIVE METABOLIC PANEL
ALT: 125 U/L — ABNORMAL HIGH (ref 0–44)
AST: 171 U/L — ABNORMAL HIGH (ref 15–41)
Albumin: 2.6 g/dL — ABNORMAL LOW (ref 3.5–5.0)
Alkaline Phosphatase: 75 U/L (ref 38–126)
Anion gap: 13 (ref 5–15)
BUN: 23 mg/dL — ABNORMAL HIGH (ref 6–20)
CO2: 18 mmol/L — ABNORMAL LOW (ref 22–32)
Calcium: 7.9 mg/dL — ABNORMAL LOW (ref 8.9–10.3)
Chloride: 104 mmol/L (ref 98–111)
Creatinine, Ser: 1.48 mg/dL — ABNORMAL HIGH (ref 0.61–1.24)
GFR calc Af Amer: >60 mL/min (ref >60)
GFR calc non Af Amer: 53 mL/min — ABNORMAL LOW (ref >60)
Glucose, Bld: 167 mg/dL — ABNORMAL HIGH (ref 70–99)
Potassium: 2.9 mmol/L — ABNORMAL LOW (ref 3.5–5.1)
Sodium: 135 mmol/L (ref 135–145)
Total Bilirubin: 1 mg/dL (ref 0.3–1.2)
Total Protein: 5.2 g/dL — ABNORMAL LOW (ref 6.5–8.1)

## 2019-02-16 LAB — POCT I-STAT EG7
Acid-base deficit: 5 mmol/L — ABNORMAL HIGH (ref 0.0–2.0)
Bicarbonate: 20.8 mmol/L (ref 20.0–28.0)
Calcium, Ion: 1.01 mmol/L — ABNORMAL LOW (ref 1.15–1.40)
HCT: 30 % — ABNORMAL LOW (ref 39.0–52.0)
Hemoglobin: 10.2 g/dL — ABNORMAL LOW (ref 13.0–17.0)
O2 Saturation: 71 %
Potassium: 2.8 mmol/L — ABNORMAL LOW (ref 3.5–5.1)
Sodium: 138 mmol/L (ref 135–145)
TCO2: 22 mmol/L (ref 22–32)
pCO2, Ven: 39.4 mmHg — ABNORMAL LOW (ref 44.0–60.0)
pH, Ven: 7.331 (ref 7.250–7.430)
pO2, Ven: 40 mmHg (ref 32.0–45.0)

## 2019-02-16 LAB — TROPONIN I (HIGH SENSITIVITY)
Troponin I (High Sensitivity): 108 ng/L (ref <18)
Troponin I (High Sensitivity): 253 ng/L (ref <18)

## 2019-02-16 LAB — MAGNESIUM: Magnesium: 1.9 mg/dL (ref 1.7–2.4)

## 2019-02-16 LAB — LACTIC ACID, PLASMA
Lactic Acid, Venous: 4.4 mmol/L (ref 0.5–1.9)
Lactic Acid, Venous: 3 mmol/L (ref 0.5–1.9)

## 2019-02-16 LAB — SARS CORONAVIRUS 2 BY RT PCR (HOSPITAL ORDER, PERFORMED IN ~~LOC~~ HOSPITAL LAB): SARS Coronavirus 2: POSITIVE — AB

## 2019-02-16 LAB — TYPE AND SCREEN
ABO/RH(D): A POS
Antibody Screen: NEGATIVE

## 2019-02-16 LAB — PROTIME-INR
INR: 1.2 (ref 0.8–1.2)
Prothrombin Time: 15.4 seconds — ABNORMAL HIGH (ref 11.4–15.2)

## 2019-02-16 LAB — APTT: aPTT: 37 seconds — ABNORMAL HIGH (ref 24–36)

## 2019-02-16 MED ORDER — ACETAMINOPHEN 650 MG RE SUPP
650.0000 mg | Freq: Once | RECTAL | Status: AC
  Administered 2019-02-16: 650 mg via RECTAL
  Filled 2019-02-16: qty 1

## 2019-02-16 MED ORDER — PROPOFOL 1000 MG/100ML IV EMUL
5.0000 ug/kg/min | INTRAVENOUS | Status: DC
  Administered 2019-02-16: 10 ug/kg/min via INTRAVENOUS
  Administered 2019-02-17: 25 ug/kg/min via INTRAVENOUS
  Administered 2019-02-18: 10 ug/kg/min via INTRAVENOUS
  Filled 2019-02-16 (×2): qty 100

## 2019-02-16 MED ORDER — SODIUM CHLORIDE 0.9 % IV SOLN
2.0000 g | Freq: Three times a day (TID) | INTRAVENOUS | Status: DC
  Administered 2019-02-17: 2 g via INTRAVENOUS
  Filled 2019-02-16: qty 2

## 2019-02-16 MED ORDER — SODIUM CHLORIDE 0.9 % IV SOLN
INTRAVENOUS | Status: DC
  Administered 2019-02-16 – 2019-02-17 (×3): via INTRAVENOUS

## 2019-02-16 MED ORDER — VANCOMYCIN HCL 10 G IV SOLR
1750.0000 mg | INTRAVENOUS | Status: DC
  Filled 2019-02-16: qty 1750

## 2019-02-16 MED ORDER — FAMOTIDINE IN NACL 20-0.9 MG/50ML-% IV SOLN
20.0000 mg | Freq: Two times a day (BID) | INTRAVENOUS | Status: DC
  Administered 2019-02-16 – 2019-02-19 (×5): 20 mg via INTRAVENOUS
  Filled 2019-02-16 (×6): qty 50

## 2019-02-16 MED ORDER — ROCURONIUM BROMIDE 50 MG/5ML IV SOLN
100.0000 mg | Freq: Once | INTRAVENOUS | Status: AC
  Administered 2019-02-16: 100 mg via INTRAVENOUS
  Filled 2019-02-16: qty 10

## 2019-02-16 MED ORDER — SODIUM CHLORIDE 0.9 % IV SOLN
2.0000 g | Freq: Once | INTRAVENOUS | Status: AC
  Administered 2019-02-16: 2 g via INTRAVENOUS
  Filled 2019-02-16: qty 2

## 2019-02-16 MED ORDER — LACTATED RINGERS IV BOLUS (SEPSIS): 1000.0000 mL | Freq: Once | INTRAVENOUS | Status: DC

## 2019-02-16 MED ORDER — VANCOMYCIN HCL 10 G IV SOLR
2500.0000 mg | Freq: Once | INTRAVENOUS | Status: AC
  Administered 2019-02-16: 22:00:00 2500 mg via INTRAVENOUS
  Filled 2019-02-16: qty 2500

## 2019-02-16 MED ORDER — LACTATED RINGERS IV BOLUS
1000.0000 mL | Freq: Once | INTRAVENOUS | Status: AC
  Administered 2019-02-16: 1000 mL via INTRAVENOUS

## 2019-02-16 MED ORDER — ETOMIDATE 2 MG/ML IV SOLN
20.0000 mg | Freq: Once | INTRAVENOUS | Status: AC
  Administered 2019-02-16: 19:00:00 20 mg via INTRAVENOUS

## 2019-02-16 MED ORDER — LACTATED RINGERS IV BOLUS (SEPSIS)
1000.0000 mL | Freq: Once | INTRAVENOUS | Status: AC
  Administered 2019-02-16: 1000 mL via INTRAVENOUS

## 2019-02-16 MED ORDER — POTASSIUM CHLORIDE 10 MEQ/100ML IV SOLN
10.0000 meq | INTRAVENOUS | Status: AC
  Administered 2019-02-16 – 2019-02-17 (×4): 10 meq via INTRAVENOUS
  Filled 2019-02-16 (×7): qty 100

## 2019-02-16 MED ORDER — MAGNESIUM SULFATE 2 GM/50ML IV SOLN
2.0000 g | Freq: Once | INTRAVENOUS | Status: AC
  Administered 2019-02-16: 2 g via INTRAVENOUS
  Filled 2019-02-16: qty 50

## 2019-02-16 MED ORDER — METRONIDAZOLE IN NACL 5-0.79 MG/ML-% IV SOLN
500.0000 mg | Freq: Once | INTRAVENOUS | Status: AC
  Administered 2019-02-16: 500 mg via INTRAVENOUS
  Filled 2019-02-16: qty 100

## 2019-02-16 MED ORDER — VANCOMYCIN HCL IN DEXTROSE 1-5 GM/200ML-% IV SOLN: 1000.0000 mg | Freq: Once | INTRAVENOUS | Status: DC

## 2019-02-16 NOTE — Progress Notes (Signed)
Pharmacy Antibiotic Note  Aaron [name] is a 53 y.o. male admitted on 02/18/2019 with infection of unknown origin.  Pt appeared to be having seizure like activity and became pulseless per wife. Pt brought to ED via EMS and ROSC was achieved. Pt is wheelchair bound which may falsely elevate clearance. Tmax 101.3 and WBC 3.0. Pharmacy has been consulted for vancomycin and cefepime dosing.  Plan: Cefepime 2g IV x1 then cefepime 2g IV q8h Vancomycin 2.5g IV x1 then vancomycin 1.75g IV q24h Check vancomycin AUC at steady state Monitor renal function and clinical progression F/u C&S  Vancomycin Goal AUC 400-550. Expected AUC: 483.8 SCr used: 1.48   Height: 6' 0.4" (183.9 cm) Weight: 285 lb 14.4 oz (129.7 kg) IBW/kg (Calculated) : 78.52  Temp (24hrs), Avg:101.3 F (38.5 C), Min:101.3 F (38.5 C), Max:101.3 F (38.5 C)  Recent Labs  Lab 02/25/2019 1944 02/01/2019 1945  WBC  --  3.0*  CREATININE  --  1.48*  LATICACIDVEN 4.4*  --     Estimated Creatinine Clearance: 79 mL/min (A) (by C-G formula based on SCr of 1.48 mg/dL (H)).    No Known Allergies  Antimicrobials this admission: Cefepime 7/21 >>  Vancomycin 7/21 >> Metronidazole x1 7/21   Dose adjustments this admission: n/a  Microbiology results: Pending  Thank you for allowing pharmacy to be a part of this patient's care.  Natale Lay 02/05/2019 7:42 PM

## 2019-02-16 NOTE — ED Provider Notes (Addendum)
[facility] Provider Note   CSN: [id number] Arrival date & time: 01/31/2019  1919     History   Chief Complaint Chief Complaint  Patient presents with   Cardiac Arrest    HPI Aaron [name] is a 53 y.o. male.     Patient is a 53 year old male with multiple medical problems including coronary artery disease, CHF with most recent EF of 15%, paroxysmal atrial fibrillation not on anticoagulation due to history of empyema in April requiring a pigtail catheter, 6 weeks of antibiotics and complicated with GI bleeding who presents today as a cardiac arrest with return of spontaneous circulation.  Wife states that yesterday he felt a little bit dehydrated but otherwise this is normal self.  Today he did complain of feeling a bit tired but she said otherwise he was acting his normal self.  He was sitting next to the couch when he suddenly made a noise and became unconscious.  Patient is wife EMS for 20 years and immediately felt and noted him to be apneic and pulseless.  He started CPR immediately.  When EMS arrived he was in a rhythm that looked like torsades and he was shocked one time.  After delivering 1 shock patient had return of circulation and return of blood pressure.  He was not breathing spontaneously but was not responsive to voice or painful stimuli.  He was found to be febrile as well and his father[facility]in-[facility].  The history is provided by the spouse and the EMS personnel. The history is limited by the condition of the patient.    Past Medical History:  Diagnosis Date   Acute systolic (congestive) heart failure (HCC) 10/2018   Atrial fibrillation, new onset (HCC) 10/2018   Coronary artery disease    Morbid obesity [facility]HCC)     Patient Active Problem List   Diagnosis Date Noted   Chronic systolic CHF (congestive heart failure) (HCC) 11/30/2018   Orthostasis    PAF (paroxysmal atrial  fibrillation) (HCC)    Coronary artery disease involving native coronary artery of native heart without angina pectoris    Acute systolic congestive heart failure (HCC)    Pleural effusion    Empyema of pleural space (HCC) 11/12/2018   Debility 11/05/2018   Pressure injury of skin 11/04/2018   Iron deficiency anemia    Orthostatic hypotension    Hematoma of right thigh 10/30/2018   Acute blood loss anemia 10/30/2018   Tobacco abuse 10/30/2018   Gastritis with bleeding 10/30/2018   Esophagitis, [place] grade C 10/30/2018   Protein-calorie malnutrition, severe 10/26/2018   SOB (shortness of breath)    Hypokalemia    Atrial fibrillation with rapid ventricular response (HCC) 10/12/2018   Morbid obesity (HCC) 10/09/2018   Acute systolic CHF (congestive heart failure) (HCC) 10/02/2018   Acute hypoxemic respiratory failure (HCC) 10/02/2018   Anasarca 10/01/2018   AKI (acute kidney injury) (HCC) 10/01/2018    Past Surgical History:  Procedure Laterality Date   CHEST TUBE INSERTION Right 11/16/2018   ESOPHAGOGASTRODUODENOSCOPY (EGD) WITH PROPOFOL N/A 10/18/2018   Procedure: ESOPHAGOGASTRODUODENOSCOPY (EGD) WITH PROPOFOL;  Surgeon: Kerin SalenKarki, Arya, MD;  [facility];  Service: Gastroenterology;  Laterality: N/A;   IR PERC PLEURAL DRAIN W/INDWELL CATH W/IMG GUIDE  11/12/2018   RIGHT/LEFT HEART CATH AND CORONARY ANGIOGRAPHY N/A 10/09/2018   Procedure: RIGHT/LEFT HEART CATH AND CORONARY ANGIOGRAPHY;  Surgeon: Iran OuchArida, Muhammad A, MD;  Location: [facility] INVASIVE CV LAB;  Service: Cardiovascular;  Laterality: N/A;        Home Medications    Prior to Admission medications   Medication Sig Start Date End Date Taking? Authorizing Provider  acetaminophen (TYLENOL) 325 MG tablet Take 1-2 tablets (325-650 mg total) by mouth every 4 (four) hours as needed for mild pain. 12/18/18   Love, Evlyn KannerPamela S, PA-C  Amino Acids-Protein Hydrolys (FEEDING SUPPLEMENT, PRO-STAT SUGAR FREE  64,) LIQD Take 30 mLs by mouth 2 (two) times daily. 12/18/18   Love, Evlyn KannerPamela S, PA-C  amiodarone (PACERONE) 200 MG tablet Take 1 tablet (200 mg total) by mouth 2 (two) times daily. 12/18/18   Love, Evlyn KannerPamela S, PA-C  apixaban (ELIQUIS) 5 MG TABS tablet Take 1 tablet (5 mg total) by mouth 2 (two) times daily. 12/18/18   Love, Evlyn KannerPamela S, PA-C  carvedilol (COREG) 3.125 MG tablet Take 1 tablet (3.125 mg total) by mouth 2 (two) times daily with a meal. 12/18/18   Love, Evlyn KannerPamela S, PA-C  ferrous sulfate 325 (65 FE) MG tablet Take 1 tablet (325 mg total) by mouth 3 (three) times daily with meals. 12/18/18   Love, Evlyn KannerPamela S, PA-C  folic acid (FOLVITE) 1 MG tablet Take 1 tablet (1 mg total) by mouth daily. 12/18/18   Love, Evlyn KannerPamela S, PA-C  furosemide (LASIX) 20 MG tablet Take 1 tablet (20 mg total) by mouth daily. 12/18/18   Love, Evlyn KannerPamela S, PA-C  magnesium oxide (MAG-OX) 400 (241.3 Mg) MG tablet Take 1 tablet (400 mg total) by mouth 2 (two) times daily. 12/18/18   Love, Evlyn KannerPamela S, PA-C  midodrine (PROAMATINE) 5 MG tablet Take 1 tablet (5 mg total) by mouth 3 (three) times daily with meals. Take first dose before getting out of bed. 12/18/18   Love, Evlyn KannerPamela S, PA-C  pantoprazole (PROTONIX) 40 MG tablet Take 1 tablet (40 mg total) by mouth 2 (two) times daily before a meal. 12/18/18   Love, Evlyn KannerPamela S, PA-C  polyethylene glycol (MIRALAX / GLYCOLAX) 17 g packet Take 17 g by mouth 2 (two) times daily. 12/18/18   Love, Evlyn KannerPamela S, PA-C  rosuvastatin (CRESTOR) 20 MG tablet Take 1 tablet (20 mg total) by mouth daily at 6 PM for 30 days. 12/18/18 02/18/2019  Jacquelynn CreeLove, Pamela S, PA-C    Family History Family History  Problem Relation Age of Onset   Alcoholism Father    Rheumatic fever Mother    Diabetes Mother    Drug abuse Brother     Social History Social History   Tobacco Use   Smoking status: Never Smoker   Smokeless tobacco: Former NeurosurgeonUser    Types: Snuff  Substance Use Topics   Alcohol use: Never    Frequency: Never   Drug  use: Never     Allergies   Patient has no known allergies.   Review of Systems Review of Systems  Unable to perform ROS: Mental status change     Physical Exam Updated Vital Signs BP (!) 161/98    Pulse 97    Temp (!) 101.2 F (38.4 C)    Resp 20    Ht 6\' 1"  (1.854 m)    Wt 129.7 kg    SpO2 97%    BMI 37.72 kg/m   Physical Exam Vitals signs and nursing note reviewed.  Constitutional:      Appearance: He is well-developed. He is obese. He is toxic-appearing.     Interventions: He is intubated.  HENT:     Head: Normocephalic and atraumatic.  Mouth/Throat:     Mouth: Mucous membranes are moist.  Eyes:     Conjunctiva/sclera: Conjunctivae normal.     Comments: 3 mm and sluggishly reactive  Neck:     Musculoskeletal: Normal range of motion and neck supple.  Cardiovascular:     Rate and Rhythm: Regular rhythm. Tachycardia present.     Heart sounds: No murmur.     Comments: Faint pulses noted in the extremities Pulmonary:     Effort: Pulmonary effort is normal. Tachypnea present. No respiratory distress. He is intubated.     Breath sounds: Normal breath sounds. No wheezing or rales.  Abdominal:     General: There is no distension.     Palpations: Abdomen is soft.     Tenderness: There is no abdominal tenderness. There is no guarding or rebound.  Musculoskeletal: Normal range of motion.        General: No tenderness.     Comments: Trace edema in lower extremities  Skin:    General: Skin is warm and dry.     Findings: No erythema or rash.  Neurological:     Mental Status: He is unresponsive.     GCS: GCS eye subscore is 1. GCS verbal subscore is 1. GCS motor subscore is 1.  Psychiatric:     Comments: unresponsive      ED Treatments / Results  Labs (all labs ordered are listed, but only abnormal results are displayed) Labs Reviewed  SARS CORONAVIRUS 2 (HOSPITAL ORDER, PERFORMED IN [facility]) - Abnormal; Notable for the following components:       Result Value   SARS Coronavirus 2 POSITIVE (*)    All other components within normal limits  LACTIC ACID, PLASMA - Abnormal; Notable for the following components:   Lactic Acid, Venous 4.4 (*)    All other components within normal limits  COMPREHENSIVE METABOLIC PANEL - Abnormal; Notable for the following components:   Potassium 2.9 (*)    CO2 18 (*)    Glucose, Bld 167 (*)    BUN 23 (*)    Creatinine, Ser 1.48 (*)    Calcium 7.9 (*)    Total Protein 5.2 (*)    Albumin 2.6 (*)    AST 171 (*)    ALT 125 (*)    GFR calc non Af Amer 53 (*)    All other components within normal limits  CBC WITH DIFFERENTIAL/PLATELET - Abnormal; Notable for the following components:   WBC 3.0 (*)    RBC 3.51 (*)    Hemoglobin 10.7 (*)    HCT 33.1 (*)    Lymphs Abs 0.5 (*)    Abs Immature Granulocytes 0.18 (*)    All other components within normal limits  APTT - Abnormal; Notable for the following components:   aPTT 37 (*)    All other components within normal limits  PROTIME-INR - Abnormal; Notable for the following components:   Prothrombin Time 15.4 (*)    All other components within normal limits  POCT I-STAT EG7 - Abnormal; Notable for the following components:   pCO2, Ven 39.4 (*)    Acid-base deficit 5.0 (*)    Potassium 2.8 (*)    Calcium, Ion 1.01 (*)    HCT 30.0 (*)    Hemoglobin 10.2 (*)    All other components within normal limits  POCT I-STAT 7, (LYTES, BLD GAS, ICA,H+H) - Abnormal; Notable for the following components:   pO2, Arterial 224.0 (*)    Potassium  2.6 (*)    HCT 29.0 (*)    Hemoglobin 9.9 (*)    All other components within normal limits  TROPONIN I (HIGH SENSITIVITY) - Abnormal; Notable for the following components:   Troponin I (High Sensitivity) 108 (*)    All other components within normal limits  CULTURE, BLOOD (ROUTINE X 2)  CULTURE, BLOOD (ROUTINE X 2)  URINE CULTURE  LACTIC ACID, PLASMA  URINALYSIS, ROUTINE W REFLEX MICROSCOPIC  MAGNESIUM  POC OCCULT  BLOOD, ED  TYPE AND SCREEN  TROPONIN I (HIGH SENSITIVITY)    EKG EKG Interpretation  Date/Time:  03-14-2019 19:25:15 EDT Ventricular Rate:  77 PR Interval:    QRS Duration: 226 QT Interval:  537 QTC Calculation: 608 R Axis:   40 Text Interpretation:  Sinus rhythm Probable left ventricular hypertrophy Probable inferior infarct, recent Prolonged QT interval T wave abnormality less pronounced than prior EKG Confirmed by Blanchie Dessert ([id number]) on 03-14-2019 7:42:12 PM   Radiology Dg Chest Port 1 View  Result Date: 03-14-19 CLINICAL DATA:  Cardiac arrest. CPR for 24 minutes. EXAM: PORTABLE CHEST 1 VIEW COMPARISON:  Chest x-ray dated 12/02/2018 FINDINGS: Endotracheal tube is 8 cm above the carina at the thoracic inlet. NG tube tip is in the fundus of the stomach. Chronic cardiomegaly. The right empyema noted on the prior exams appears to have resolved. There is pulmonary vascular congestion. Atelectasis at the left lung base. No acute bone abnormality. IMPRESSION: 1. Endotracheal tube is 8 cm above the carina at the thoracic inlet. 2. NG tube tip is in the fundus of the stomach. 3. Cardiomegaly with pulmonary vascular congestion. 4. Left base atelectasis. Electronically Signed   By: Lorriane Shire M.D.   On: 14-Mar-2019 20:10    Procedures Date/Time: 14-Mar-2019 10:29 PM Performed by: Blanchie Dessert, MD Pre-anesthesia Checklist: Patient identified, Emergency Drugs available, Suction available and Patient being monitored Oxygen Delivery Method: Ambu bag Preoxygenation: Pre-oxygenation with 100% oxygen Induction Type: Rapid sequence Ventilation: Oral airway inserted - appropriate to patient size Laryngoscope Size: Glidescope and 3 Grade View: Grade I Number of attempts: 1 Airway Equipment and Method: Video-laryngoscopy Secured at: 23 cm Tube secured with: ETT holder Dental Injury: Teeth and Oropharynx as per pre-operative assessment  Difficulty Due To: Difficulty was  unanticipated      (including critical care time)  Medications Ordered in ED Medications  metroNIDAZOLE (FLAGYL) IVPB 500 mg (500 mg Intravenous New Bag/Given 03/14/2019 2046)  vancomycin (VANCOCIN) 2,500 mg in sodium chloride 0.9 % 500 mL IVPB (has no administration in time range)  propofol (DIPRIVAN) 1000 MG/100ML infusion (10 mcg/kg/min  129.7 kg Intravenous New Bag/Given Mar 14, 2019 1941)  vancomycin (VANCOCIN) 1,750 mg in sodium chloride 0.9 % 500 mL IVPB (has no administration in time range)  ceFEPIme (MAXIPIME) 2 g in sodium chloride 0.9 % 100 mL IVPB (has no administration in time range)  etomidate (AMIDATE) injection 20 mg (20 mg Intravenous Given 2019/03/14 1926)  rocuronium (ZEMURON) injection 100 mg (100 mg Intravenous Given 03-14-19 1927)  lactated ringers bolus 1,000 mL (0 mLs Intravenous Stopped 14-Mar-2019 2104)  ceFEPIme (MAXIPIME) 2 g in sodium chloride 0.9 % 100 mL IVPB (0 g Intravenous Stopped 2019-03-14 2043)  acetaminophen (TYLENOL) suppository 650 mg (650 mg Rectal Given 2019/03/14 2122)  lactated ringers bolus 1,000 mL (1,000 mLs Intravenous New Bag/Given 03-14-2019 2104)     Initial Impression / Assessment and Plan / ED Course  I have reviewed the triage vital signs and the nursing notes.  Pertinent  labs & imaging results that were available during my care of the patient were reviewed by me and considered in my medical decision making (see chart for details).       Patient presenting status post arrest after being in torsades and being shocked 1 time.  Patient was breathing spontaneously but was not responding to pain.  Patient is febrile here to 101.3 but easy to ventilate.  Breath sounds are clear.  Patient does have some dark loose stool but it is heme negative and wife states he takes medicine to help him use the bathroom.  Patient's labs are consistent with hypokalemia of 2.6 with mild AKI.  Lactate of 4 and EEG 7 with a normal pH.  Patient is on 40% FiO2 with a respiratory rate  of 20 and a PEEP of 5.  Chest x-ray without acute findings and good tube placement.  EKG showed sinus rhythm with right bundle branch block.  Troponin is 108.  Patient is positive for COVID and findings were discussed with his family.  Discussed possibility of cooling with critical care and they will come and evaluate the patient.  Because of patient's EF of 15% and stable blood pressure not requiring support he was given 2 L of lactated Ringer's and code sepsis was called.  Patient was given vancomycin and Zosyn.  He will require admission by critical care.  CRITICAL CARE Performed by: Maleya Leever Total critical care time: 40 minutes Critical care time was exclusive of separately billable procedures and treating other patients. Critical care was necessary to treat or prevent imminent or life-threatening deterioration. Critical care was time spent personally by me on the following activities: development of treatment plan with patient and/or surrogate as well as nursing, discussions with consultants, evaluation of patient's response to treatment, examination of patient, obtaining history from patient or surrogate, ordering and performing treatments and interventions, ordering and review of laboratory studies, ordering and review of radiographic studies, pulse oximetry and re-evaluation of patient's condition.   Final Clinical Impressions(s) / ED Diagnoses   Final diagnoses:  Cardiac arrest (HCC)  COVID-19  Sepsis with acute hypoxic respiratory failure and septic shock, due to unspecified organism [facility])    ED Discharge Orders    None       Gwyneth Sprout, MD 2019-02-26 2229    Gwyneth Sprout, MD 02/27/19 1825

## 2019-02-16 NOTE — H&P (Signed)
NAME:  Aaron [name], MRN:  [id number], DOB:  1966-02-03, LOS: 0 ADMISSION DATE:  March 12, 2019, CONSULTATION DATE:  2019-03-12 REFERRING MD:  ER , CHIEF COMPLAINT:  Cardiac arrest   Brief History   53 year old male from home with prolonged cardiac arrest from home found to be COVID positive.   History of present illness   HPI obtained from medical chart review and per wife by phone as patient is intubated and unresponsive on sedation.    53 year old male with history of systolic HF/ ICM with EF 15%, PAF on eliquis (not taking around 3 weeks), severe CAD s/p PCI in 10/2018, IDA, protein-calorie malnutrition, obesity, and debility.  Patient is wheelchair bound at home.    Wife reports patient has been in his normal state of health until today.  At baseline, patient is wheelchair bound.  Has not left the house in over 2 weeks.  Wife cares for patient and she works at [employer].  No family members in the house symptomatic or with known COVID.  Today, patient complained of mild fatigue and no appetite.  Denies any fever, nausea, vomiting, diarrhea, chest pain or shortness of breath.  Wife does state that patient ran out of his eliquis around 3 weeks ago.   Wife reports hearing a gurgling sound, noted that he had brief seizure like activity, then went apneic and pulseless.  CPR immediately started by wife.  Found to be in PEA by EMS with ACLS measures and episode of torsades.  Initially required epi drip with EMS since off in ER.  ROSC obtained after 24 minutes.  On arrival to ER, patient remained unresponsive and therefore intubated for airway protection.  Labs noted for K 2.9, Na 139, CO2 18, glucose 167, BUN 23, sCr 1.48 (prior 1.03 in 11/2018), AST 171, ALT 125, Mag 1.9, COVID positive, hs troponin 108, WBC 3, Hgb 10.7, Hct 33.1.  CXR noted for cardiomegaly with pulmonary vascular congestion and left basilar atelectasis.  He has been febrile with temp 101.3 rectal, and normotensive, not requiring current  vasopressors.  PCCM called for admit.    Past Medical History  Systolic HF/ ICM with EF 15%, PAF on eliquis (not taking around 3 weeks), severe CAD s/p PCI in 10/2018, IDA, protein-calorie malnutrition, obesity, debility  Significant Hospital Events   7/21 Admit  Consults:   Procedures:  7/21 ETT >>  Significant Diagnostic Tests:   Micro Data:  7/21  SARS CoV-2>> positive  7/21 BC x 2 >>  7/21 UC >>  Antimicrobials:  7/21 cefepime>> 7/21 flagyl >> 7/21 vancomycin >>  Interim history/subjective:  On propofol 10 mcg/kg/ min  Objective   Blood pressure (!) 161/98, pulse 97, temperature (!) 101.2 F (38.4 C), resp. rate 20, height 6\' 1"  (1.854 m), weight 129.7 kg, SpO2 97 %.    Vent Mode: PRVC FiO2 (%):  [100 %] 100 % Set Rate:  [24 bmp] 24 bmp Vt Set:  [630 mL] 630 mL PEEP:  [5 cmH20] 5 cmH20 Plateau Pressure:  [16 cmH20] 16 cmH20   Intake/Output Summary (Last 24 hours) at 03-12-19 2154 Last data filed at 2019-03-12 2104 Gross per 24 hour  Intake 1000 ml  Output -  Net 1000 ml   Filed Weights   03/12/19 1900  Weight: 129.7 kg   Examination: Assessment per Dr. Minette Headland  [facility] Problem list    Assessment & Plan:   Cardiac arrest- unclear etiology at this point, ddx include electrolyte disturbance with low  EF  Prolonged QT- 0.608 ICM/ systolic HF with EF 15 (11/2018) Hx HTN, HLD (on midodrine at home?) P:  ICU/ tele monitor Goal MAP >65, currently not requiring vasopressors Spoke with wife, no CPR but all other measures short term, including vasopressors / central line if needed  Goal K > 4, Mag <2 KCL 4 runs IV and Mag 2 gm now TTE in am  Trend EKG/ hs-trop Trend BMP Hold home lasix, coreg, midodrine    Acute respiratory insufficiency in the setting above Pulmonary edema  P:  Full MV support, rate 24 Trend CXR/ ABG in am  CXR reviewed- advance ett by 2cm VAP bundle Daily SBT if meets criteria  Consider lasix when K improved    Acute encephalopathy s/p prolonged cardiac arrest P:  CTH now Normothermia protocol for now, 33 degrees deferred given ancitipated electrolyte shifts with severe hypokalemia present EEG Hold prognostication till 72 hours  PAD Protocol with propofol/ prn fentanyl, RASS goal 0/-1  AKI NAGMA/ lactic acidosis Hypokalemia  P:  S/p 2L NS Continue foley Trend BMP q 12/ mag /phos/ I/Os / urinary output Replace electrolytes as indicated Avoid nephrotoxic agents, ensure adequate renal perfusion  Mild Transaminitis P:  ? Shock liver Trend LFTs and assess INR   PAF (not on eliquis for 3 weeks) - currently in SR P:  Monitor Hold amiodarone given prolonged qTC  COVID 19 P:  Continue airborne/ contact protections assess ddimer, LDH, IL6, ferritin, CRP, PCT Will discuss other treatments with attending  IDA P:  Trend CBC  Protein calorie malnutrition P:  Start nutrition when able   Best practice:  Diet: NPO Pain/Anxiety/Delirium protocol (if indicated): propofol/ fentanyl VAP protocol (if indicated): yes DVT prophylaxis: pending CTH- consider lovenox pending ddimer GI prophylaxis: pepcid Glucose control: CBG q 4, add SSI if >180 Mobility: BR Code Status: limited, no CPR, but wife ok with other medical care for now Family Communication: Wife, [name] [phone], updated by phone at length Disposition: ICU- [facility]  Labs   CBC: Recent Labs  Lab 02/07/2019 1945 02/10/2019 1959 02/12/2019 2050  WBC 3.0*  --   --   NEUTROABS 2.1  --   --   HGB 10.7* 10.2* 9.9*  HCT 33.1* 30.0* 29.0*  MCV 94.3  --   --   PLT 156  --   --     Basic Metabolic Panel: Recent Labs  Lab 02/15/2019 1945 02/14/2019 1959 01/31/2019 2050  NA 135 138 137  K 2.9* 2.8* 2.6*  CL 104  --   --   CO2 18*  --   --   GLUCOSE 167*  --   --   BUN 23*  --   --   CREATININE 1.48*  --   --   CALCIUM 7.9*  --   --    GFR: Estimated Creatinine Clearance: 79.6 mL/min (A) (by C-G  formula based on SCr of 1.48 mg/dL (H)). Recent Labs  Lab 02/18/2019 1944 02/11/2019 1945  WBC  --  3.0*  LATICACIDVEN 4.4*  --     Liver Function Tests: Recent Labs  Lab 02/23/2019 1945  AST 171*  ALT 125*  ALKPHOS 75  BILITOT 1.0  PROT 5.2*  ALBUMIN 2.6*   No results for input(s): LIPASE, AMYLASE in the last 168 hours. No results for input(s): AMMONIA in the last 168 hours.  ABG    Component Value Date/Time   PHART 7.391 02/03/2019 2050   PCO2ART 39.6 02/15/2019  2050   PO2ART 224.0 (H) 02/14/2019 2050   HCO3 23.7 02/04/2019 2050   TCO2 25 01/31/2019 2050   ACIDBASEDEF 1.0 01/30/2019 2050   O2SAT 100.0 02/18/2019 2050     Coagulation Profile: Recent Labs  Lab 02/14/2019 1945  INR 1.2    Cardiac Enzymes: No results for input(s): CKTOTAL, CKMB, CKMBINDEX, TROPONINI in the last 168 hours.  HbA1C: Hgb A1c MFr Bld  Date/Time Value Ref Range Status  10/10/2018 05:06 AM 5.8 (H) 4.8 - 5.6 % Final    Comment:    (NOTE) Pre diabetes:          5.7%-6.4% Diabetes:              >6.4% Glycemic control for   <7.0% adults with diabetes     CBG: No results for input(s): GLUCAP in the last 168 hours.  Review of Systems:   Unable   Past Medical History  He,  has a past medical history of Acute systolic (congestive) heart failure (HCC) (10/2018), Atrial fibrillation, new onset (HCC) (10/2018), Coronary artery disease, and Morbid obesity (HCC).   Surgical History    Past Surgical History:  Procedure Laterality Date  . CHEST TUBE INSERTION Right 11/16/2018  . ESOPHAGOGASTRODUODENOSCOPY (EGD) WITH PROPOFOL N/A 10/18/2018   Procedure: ESOPHAGOGASTRODUODENOSCOPY (EGD) WITH PROPOFOL;  Surgeon: Kerin SalenKarki, Arya, MD;  [facility];  Service: Gastroenterology;  Laterality: N/A;  . IR PERC PLEURAL DRAIN W/INDWELL CATH W/IMG GUIDE  11/12/2018  . RIGHT/LEFT HEART CATH AND CORONARY ANGIOGRAPHY N/A 10/09/2018   Procedure: RIGHT/LEFT HEART CATH AND CORONARY ANGIOGRAPHY;  Surgeon:  Iran OuchArida, Muhammad A, MD;  Location: [facility] INVASIVE CV LAB;  Service: Cardiovascular;  Laterality: N/A;     Social History   reports that he has never smoked. He has quit using smokeless tobacco.  His smokeless tobacco use included snuff. He reports that he does not drink alcohol or use drugs.   Family History   His family history includes Alcoholism in his father; Diabetes in his mother; Drug abuse in his brother; Rheumatic fever in his mother.   Allergies No Known Allergies   Home Medications  Prior to Admission medications   Medication Sig Start Date End Date Taking? Authorizing Provider  acetaminophen (TYLENOL) 325 MG tablet Take 1-2 tablets (325-650 mg total) by mouth every 4 (four) hours as needed for mild pain. 12/18/18   Love, Evlyn KannerPamela S, PA-C  Amino Acids-Protein Hydrolys (FEEDING SUPPLEMENT, PRO-STAT SUGAR FREE 64,) LIQD Take 30 mLs by mouth 2 (two) times daily. 12/18/18   Love, Evlyn KannerPamela S, PA-C  amiodarone (PACERONE) 200 MG tablet Take 1 tablet (200 mg total) by mouth 2 (two) times daily. 12/18/18   Love, Evlyn KannerPamela S, PA-C  apixaban (ELIQUIS) 5 MG TABS tablet Take 1 tablet (5 mg total) by mouth 2 (two) times daily. 12/18/18   Love, Evlyn KannerPamela S, PA-C  carvedilol (COREG) 3.125 MG tablet Take 1 tablet (3.125 mg total) by mouth 2 (two) times daily with a meal. 12/18/18   Love, Evlyn KannerPamela S, PA-C  ferrous sulfate 325 (65 FE) MG tablet Take 1 tablet (325 mg total) by mouth 3 (three) times daily with meals. 12/18/18   Love, Evlyn KannerPamela S, PA-C  folic acid (FOLVITE) 1 MG tablet Take 1 tablet (1 mg total) by mouth daily. 12/18/18   Love, Evlyn KannerPamela S, PA-C  furosemide (LASIX) 20 MG tablet Take 1 tablet (20 mg total) by mouth daily. 12/18/18   Love, Evlyn KannerPamela S, PA-C  magnesium oxide (MAG-OX) 400 (241.3 Mg) MG tablet  Take 1 tablet (400 mg total) by mouth 2 (two) times daily. 12/18/18   Love, Ivan Anchors, PA-C  midodrine (PROAMATINE) 5 MG tablet Take 1 tablet (5 mg total) by mouth 3 (three) times daily with meals. Take first dose  before getting out of bed. 12/18/18   Love, Ivan Anchors, PA-C  pantoprazole (PROTONIX) 40 MG tablet Take 1 tablet (40 mg total) by mouth 2 (two) times daily before a meal. 12/18/18   Love, Ivan Anchors, PA-C  polyethylene glycol (MIRALAX / GLYCOLAX) 17 g packet Take 17 g by mouth 2 (two) times daily. 12/18/18   Love, Ivan Anchors, PA-C  rosuvastatin (CRESTOR) 20 MG tablet Take 1 tablet (20 mg total) by mouth daily at 6 PM for 30 days. 12/18/18 02/20/2019  Bary Leriche, PA-C     Critical care time: 60 mins    Kennieth Rad, MSN, AGACNP-BC [facility] Pgr: [phone] or if no answer [phone] February 20, 2019, 11:09 PM

## 2019-02-16 NOTE — ED Triage Notes (Signed)
Patient is from home, he was in baseline health, sitting in wheelchair.  Wife states that she heard a "gurl" sound and looked at her husband, he was having some seizure like activity, she assessed him and found him apneic and pulseless and immediately started CPR.  EMS arrived and found him in Torsades, patient shocked x1 and got ROSC.  Patient had CPR for total of 24 mins.  Patient's pressure did drop to 50 systolic, came in with Epi drip at 63mcg/min.

## 2019-02-16 NOTE — ED Notes (Signed)
Woody, RN notified of Lactic Acid 3.0

## 2019-02-16 NOTE — ED Notes (Signed)
Chaplain otw

## 2019-02-16 NOTE — Progress Notes (Addendum)
eLink Physician-Brief Progress Note Patient Name: Aaron [name] DOB: May 06, 1966 MRN: [id number]   Date of Service  03/12/19  HPI/Events of Note  A 53 year old male with history of CHF with a EF of 15% was brought into the ED status post cardiac arrest initially PEA followed by torsades requiring CPR and shock.  eICU Interventions  Lactic was 4.4, received 2 L of IV fluids.  Intubated.  Notified nurse practitioner Jerene Pitch and recommended to evaluate for hypothermia protocol.        [name] [name] Mar 12, 2019, 9:48 PM   6:12 AM Potassium 3.2. Ordered 30 mEq IV potassium replacement.

## 2019-02-16 NOTE — Progress Notes (Signed)
   02/25/2019 2100  Clinical Encounter Type  Visited With Health care provider;Family;Patient not available  Visit Type Initial;Follow-up;ED  Referral From Other (Comment) (ED called)  Spiritual Encounters  Spiritual Needs Emotional;Prayer  Stress Factors  Patient Stress Factors Not reviewed  Family Stress Factors Major life changes;Loss of control   Responded to request from ED to visit w/ family as pt is post-CPR.  Met w them in consult room and prayed w them and wife's co-workers, went to pt room to check in w ED, entered and spoke w Dr. Maryan Rued.  Went w Dr. Maryan Rued when she met w family.  Compassionate presence, empathetic listening to family's multiple health challenges this year, which compound grief.  Wife works for [employer], her father is hospitalized [facility].  Met w wife, 2 adult sons, a few of wife's [employer] colleagues who were in the [place].    Stepped away and let family and RN Gretta Cool know to page if needed before I returned from another visit.  Returned, family was outside pt room, told me they just got news that pt is COVID +.  Empathetic listening.    Myra Gianotti resident, [phone]

## 2019-02-17 ENCOUNTER — Inpatient Hospital Stay (HOSPITAL_COMMUNITY): Payer: Medicaid Other

## 2019-02-17 ENCOUNTER — Encounter (HOSPITAL_COMMUNITY): Payer: Self-pay

## 2019-02-17 DIAGNOSIS — I469 Cardiac arrest, cause unspecified: Secondary | ICD-10-CM

## 2019-02-17 LAB — BASIC METABOLIC PANEL
Anion gap: 10 (ref 5–15)
Anion gap: 12 (ref 5–15)
Anion gap: 13 (ref 5–15)
BUN: 23 mg/dL — ABNORMAL HIGH (ref 6–20)
BUN: 24 mg/dL — ABNORMAL HIGH (ref 6–20)
BUN: 35 mg/dL — ABNORMAL HIGH (ref 6–20)
CO2: 19 mmol/L — ABNORMAL LOW (ref 22–32)
CO2: 17 mmol/L — ABNORMAL LOW (ref 22–32)
CO2: 16 mmol/L — ABNORMAL LOW (ref 22–32)
Calcium: 8.1 mg/dL — ABNORMAL LOW (ref 8.9–10.3)
Calcium: 7.9 mg/dL — ABNORMAL LOW (ref 8.9–10.3)
Calcium: 8.5 mg/dL — ABNORMAL LOW (ref 8.9–10.3)
Chloride: 105 mmol/L (ref 98–111)
Chloride: 107 mmol/L (ref 98–111)
Chloride: 106 mmol/L (ref 98–111)
Creatinine, Ser: 1.17 mg/dL (ref 0.61–1.24)
Creatinine, Ser: 1.15 mg/dL (ref 0.61–1.24)
Creatinine, Ser: 1.4 mg/dL — ABNORMAL HIGH (ref 0.61–1.24)
GFR calc Af Amer: >60 mL/min (ref >60)
GFR calc Af Amer: >60 mL/min (ref >60)
GFR calc Af Amer: >60 mL/min (ref >60)
GFR calc non Af Amer: >60 mL/min (ref >60)
GFR calc non Af Amer: >60 mL/min (ref >60)
GFR calc non Af Amer: 56 mL/min — ABNORMAL LOW (ref >60)
Glucose, Bld: 128 mg/dL — ABNORMAL HIGH (ref 70–99)
Glucose, Bld: 122 mg/dL — ABNORMAL HIGH (ref 70–99)
Glucose, Bld: 85 mg/dL (ref 70–99)
Potassium: 3.2 mmol/L — ABNORMAL LOW (ref 3.5–5.1)
Potassium: 3.7 mmol/L (ref 3.5–5.1)
Potassium: 4.5 mmol/L (ref 3.5–5.1)
Sodium: 134 mmol/L — ABNORMAL LOW (ref 135–145)
Sodium: 136 mmol/L (ref 135–145)
Sodium: 135 mmol/L (ref 135–145)

## 2019-02-17 LAB — BLOOD GAS, ARTERIAL
Acid-base deficit: 4.1 mmol/L — ABNORMAL HIGH (ref 0.0–2.0)
Bicarbonate: 20 mmol/L (ref 20.0–28.0)
Drawn by: 418751
FIO2: 60
MECHVT: 630 mL
O2 Saturation: 93.1 %
PEEP: 8 cmH2O
Patient temperature: 98.6
RATE: 20 resp/min
pCO2 arterial: 34 mmHg (ref 32.0–48.0)
pH, Arterial: 7.388 (ref 7.350–7.450)
pO2, Arterial: 73.5 mmHg — ABNORMAL LOW (ref 83.0–108.0)

## 2019-02-17 LAB — POCT I-STAT 7, (LYTES, BLD GAS, ICA,H+H)
Acid-base deficit: 4 mmol/L — ABNORMAL HIGH (ref 0.0–2.0)
Bicarbonate: 20.9 mmol/L (ref 20.0–28.0)
Calcium, Ion: 1.21 mmol/L (ref 1.15–1.40)
HCT: 32 % — ABNORMAL LOW (ref 39.0–52.0)
Hemoglobin: 10.9 g/dL — ABNORMAL LOW (ref 13.0–17.0)
O2 Saturation: 98 %
Patient temperature: 36
Potassium: 3.7 mmol/L (ref 3.5–5.1)
Sodium: 137 mmol/L (ref 135–145)
TCO2: 22 mmol/L (ref 22–32)
pCO2 arterial: 33.4 mmHg (ref 32.0–48.0)
pH, Arterial: 7.4 (ref 7.350–7.450)
pO2, Arterial: 102 mmHg (ref 83.0–108.0)

## 2019-02-17 LAB — LACTATE DEHYDROGENASE: LDH: 675 U/L — ABNORMAL HIGH (ref 98–192)

## 2019-02-17 LAB — PHOSPHORUS
Phosphorus: 3.4 mg/dL (ref 2.5–4.6)
Phosphorus: 3.2 mg/dL (ref 2.5–4.6)
Phosphorus: 4.6 mg/dL (ref 2.5–4.6)

## 2019-02-17 LAB — BRAIN NATRIURETIC PEPTIDE: B Natriuretic Peptide: 1023.4 pg/mL — ABNORMAL HIGH (ref 0.0–100.0)

## 2019-02-17 LAB — ECHOCARDIOGRAM COMPLETE
Height: 73 in
Weight: 4112.9 oz

## 2019-02-17 LAB — GLUCOSE, CAPILLARY
Glucose-Capillary: 104 mg/dL — ABNORMAL HIGH (ref 70–99)
Glucose-Capillary: 116 mg/dL — ABNORMAL HIGH (ref 70–99)
Glucose-Capillary: 81 mg/dL (ref 70–99)
Glucose-Capillary: 85 mg/dL (ref 70–99)
Glucose-Capillary: 91 mg/dL (ref 70–99)
Glucose-Capillary: 94 mg/dL (ref 70–99)

## 2019-02-17 LAB — LACTIC ACID, PLASMA: Lactic Acid, Venous: 3.3 mmol/L (ref 0.5–1.9)

## 2019-02-17 LAB — MAGNESIUM: Magnesium: 2.3 mg/dL (ref 1.7–2.4)

## 2019-02-17 LAB — PROTIME-INR
INR: 1.3 — ABNORMAL HIGH (ref 0.8–1.2)
Prothrombin Time: 16.2 seconds — ABNORMAL HIGH (ref 11.4–15.2)

## 2019-02-17 LAB — CORTISOL: Cortisol, Plasma: 25.9 ug/dL

## 2019-02-17 LAB — D-DIMER, QUANTITATIVE: D-Dimer, Quant: >20 ug/mL-FEU — ABNORMAL HIGH (ref 0.00–0.50)

## 2019-02-17 LAB — APTT: aPTT: 36 seconds (ref 24–36)

## 2019-02-17 LAB — FIBRINOGEN: Fibrinogen: 314 mg/dL (ref 210–475)

## 2019-02-17 LAB — FERRITIN: Ferritin: >7500 ng/mL — ABNORMAL HIGH (ref 24–336)

## 2019-02-17 LAB — C-REACTIVE PROTEIN: CRP: 13.7 mg/dL — ABNORMAL HIGH (ref <1.0)

## 2019-02-17 MED ORDER — FENTANYL CITRATE (PF) 100 MCG/2ML IJ SOLN: 50.0000 ug | INTRAMUSCULAR | Status: DC | PRN

## 2019-02-17 MED ORDER — FENTANYL CITRATE (PF) 100 MCG/2ML IJ SOLN
50.0000 ug | INTRAMUSCULAR | Status: DC | PRN
  Administered 2019-02-17: 100 ug via INTRAVENOUS
  Filled 2019-02-17: qty 2

## 2019-02-17 MED ORDER — LACTATED RINGERS IV SOLN
INTRAVENOUS | Status: DC
  Administered 2019-02-17 – 2019-02-19 (×2): via INTRAVENOUS

## 2019-02-17 MED ORDER — MIDODRINE HCL 5 MG PO TABS
10.0000 mg | ORAL_TABLET | Freq: Three times a day (TID) | ORAL | Status: DC
  Administered 2019-02-17 (×2): 10 mg via ORAL
  Filled 2019-02-17 (×2): qty 2

## 2019-02-17 MED ORDER — PRO-STAT SUGAR FREE PO LIQD
60.0000 mL | Freq: Three times a day (TID) | ORAL | Status: DC
  Administered 2019-02-17 – 2019-02-19 (×6): 60 mL
  Filled 2019-02-17 (×5): qty 60

## 2019-02-17 MED ORDER — ARTIFICIAL TEARS OPHTHALMIC OINT: TOPICAL_OINTMENT | OPHTHALMIC | Status: DC | PRN

## 2019-02-17 MED ORDER — VITAL HIGH PROTEIN PO LIQD
1000.0000 mL | ORAL | Status: DC
  Administered 2019-02-17: 1000 mL

## 2019-02-17 MED ORDER — CHLORHEXIDINE GLUCONATE 0.12% ORAL RINSE (MEDLINE KIT)
15.0000 mL | Freq: Two times a day (BID) | OROMUCOSAL | Status: DC
  Administered 2019-02-17 – 2019-02-18 (×4): 15 mL via OROMUCOSAL

## 2019-02-17 MED ORDER — MIDAZOLAM HCL 2 MG/2ML IJ SOLN
2.0000 mg | INTRAMUSCULAR | Status: AC | PRN
  Administered 2019-02-17 (×3): 2 mg via INTRAVENOUS
  Filled 2019-02-17 (×3): qty 2

## 2019-02-17 MED ORDER — ACETAMINOPHEN 325 MG PO TABS
650.0000 mg | ORAL_TABLET | Freq: Four times a day (QID) | ORAL | Status: DC
  Administered 2019-02-17 – 2019-02-19 (×8): 650 mg via ORAL
  Filled 2019-02-17 (×9): qty 2

## 2019-02-17 MED ORDER — ENOXAPARIN SODIUM 120 MG/0.8ML ~~LOC~~ SOLN
1.0000 mg/kg | Freq: Two times a day (BID) | SUBCUTANEOUS | Status: DC
  Administered 2019-02-17 – 2019-02-19 (×4): 115 mg via SUBCUTANEOUS
  Filled 2019-02-17 (×5): qty 0.78

## 2019-02-17 MED ORDER — VITAL AF 1.2 CAL PO LIQD
1000.0000 mL | ORAL | Status: DC
  Administered 2019-02-17 – 2019-02-18 (×2): 1000 mL

## 2019-02-17 MED ORDER — MIDAZOLAM HCL 2 MG/2ML IJ SOLN
2.0000 mg | INTRAMUSCULAR | Status: DC | PRN
  Administered 2019-02-17 – 2019-02-18 (×7): 2 mg via INTRAVENOUS
  Filled 2019-02-17 (×6): qty 2

## 2019-02-17 MED ORDER — DEXAMETHASONE SODIUM PHOSPHATE 10 MG/ML IJ SOLN
6.0000 mg | Freq: Every day | INTRAMUSCULAR | Status: DC
  Administered 2019-02-17 – 2019-02-19 (×3): 6 mg via INTRAVENOUS
  Filled 2019-02-17 (×3): qty 0.6

## 2019-02-17 MED ORDER — FENTANYL CITRATE (PF) 100 MCG/2ML IJ SOLN
50.0000 ug | Freq: Once | INTRAMUSCULAR | Status: AC
  Administered 2019-02-17: 50 ug via INTRAVENOUS
  Filled 2019-02-17: qty 2

## 2019-02-17 MED ORDER — FENTANYL 2500MCG IN NS 250ML (10MCG/ML) PREMIX INFUSION
50.0000 ug/h | INTRAVENOUS | Status: DC
  Administered 2019-02-17: 150 ug/h via INTRAVENOUS
  Administered 2019-02-17: 50 ug/h via INTRAVENOUS
  Administered 2019-02-17: 150 ug/h via INTRAVENOUS
  Filled 2019-02-17 (×4): qty 250

## 2019-02-17 MED ORDER — ENOXAPARIN SODIUM 80 MG/0.8ML ~~LOC~~ SOLN
75.0000 mg | Freq: Once | SUBCUTANEOUS | Status: AC
  Administered 2019-02-17: 75 mg via SUBCUTANEOUS
  Filled 2019-02-17: qty 0.75

## 2019-02-17 MED ORDER — FENTANYL BOLUS VIA INFUSION
50.0000 ug | INTRAVENOUS | Status: DC | PRN
  Administered 2019-02-17 – 2019-02-18 (×4): 50 ug via INTRAVENOUS
  Filled 2019-02-17: qty 50

## 2019-02-17 MED ORDER — PERFLUTREN LIPID MICROSPHERE
INTRAVENOUS | Status: AC
  Filled 2019-02-17: qty 10

## 2019-02-17 MED ORDER — ACETAMINOPHEN 10 MG/ML IV SOLN
1000.0000 mg | Freq: Once | INTRAVENOUS | Status: AC
  Administered 2019-02-17: 1000 mg via INTRAVENOUS
  Filled 2019-02-17: qty 100

## 2019-02-17 MED ORDER — POTASSIUM CHLORIDE 10 MEQ/100ML IV SOLN
10.0000 meq | INTRAVENOUS | Status: AC
  Administered 2019-02-17 (×3): 10 meq via INTRAVENOUS
  Filled 2019-02-17: qty 100

## 2019-02-17 MED ORDER — ENOXAPARIN SODIUM 120 MG/0.8ML ~~LOC~~ SOLN
1.0000 mg/kg | Freq: Two times a day (BID) | SUBCUTANEOUS | Status: DC
  Filled 2019-02-17: qty 0.78

## 2019-02-17 MED ORDER — ORAL CARE MOUTH RINSE
15.0000 mL | OROMUCOSAL | Status: DC
  Administered 2019-02-17 – 2019-02-19 (×21): 15 mL via OROMUCOSAL

## 2019-02-17 MED ORDER — MIDODRINE HCL 5 MG PO TABS
10.0000 mg | ORAL_TABLET | Freq: Three times a day (TID) | ORAL | Status: DC
  Administered 2019-02-17 – 2019-02-19 (×5): 10 mg
  Filled 2019-02-17 (×5): qty 2

## 2019-02-17 MED ORDER — ENOXAPARIN SODIUM 40 MG/0.4ML ~~LOC~~ SOLN
40.0000 mg | Freq: Two times a day (BID) | SUBCUTANEOUS | Status: DC
  Administered 2019-02-17: 40 mg via SUBCUTANEOUS
  Filled 2019-02-17 (×2): qty 0.4

## 2019-02-17 MED ORDER — PRO-STAT SUGAR FREE PO LIQD
30.0000 mL | Freq: Two times a day (BID) | ORAL | Status: DC
  Administered 2019-02-17: 30 mL
  Filled 2019-02-17: qty 30

## 2019-02-17 MED ORDER — CHLORHEXIDINE GLUCONATE CLOTH 2 % EX PADS
6.0000 | MEDICATED_PAD | Freq: Every day | CUTANEOUS | Status: DC
  Administered 2019-02-17 – 2019-02-18 (×2): 6 via TOPICAL

## 2019-02-17 NOTE — Progress Notes (Signed)
At 0607 pt had upward gaze, no blinking, no twitching motions in eyes or extremities. Gave PRN fentanyl with no relief, gave PRN versed with appeared relief. Episode lasted until 0622. MD notified, camera into room. Orders to give versed if this happens again.

## 2019-02-17 NOTE — Progress Notes (Addendum)
Pt with increased shivering, titrated prop up to control. Not effective. Used PRN fentanyl and versed. More effective. Decreased prop again d/t pt's BP. Discussed with NP. No tylenol due to increased liver enzymes. Orders received to turn of prop gtt, switch for fent gtt and use versed PRN as needed for shivering. \  CRITICAL VALUE ALERT  Critical Value:  Lactic acid 3.3  Date & Time Notied:  02/17/19 4:15AM  Provider Notified: Kennieth Rad, NP  Orders Received/Actions taken: no new orders   5:45 AM Called Elink regarding AM K 3.2, awaiting new orders.

## 2019-02-17 NOTE — Progress Notes (Addendum)
Patient's core temp is 37.3 and water temp is 5.5. Patient is currently shivering. PRN Fentanyl and versed pushes were given along with 650mg  of Tylenol. Counter warming techniques were also done with no success. Elink paged. Will continue to monitor.

## 2019-02-17 NOTE — Progress Notes (Signed)
Initial Nutrition Assessment  DOCUMENTATION CODES:   Not applicable  INTERVENTION:    Vital AF 1.2 at 45 ml/h (1080 ml per day)   Pro-stat 60 ml TID   Provides 1896 kcal, 171 gm protein, 876 ml free water daily  NUTRITION DIAGNOSIS:   Inadequate oral intake related to inability to eat as evidenced by NPO status.  GOAL:   Provide needs based on ASPEN/SCCM guidelines  MONITOR:   Vent status, Labs, TF tolerance, Skin  REASON FOR ASSESSMENT:   Ventilator, Consult Enteral/tube feeding initiation and management  ASSESSMENT:   53 yo male admitted from home with prolonged cardiac arrest, found to be COVID positive. PMH includes CHF, A fib, CAD, malnutrition, obesity, wheelchair bound.  Received MD Consult for TF initiation and management. OGT in place.  Patient is currently intubated on ventilator support MV: 15.2 L/min Temp (24hrs), Avg:100.9 F (38.3 C), Min:96.4 F (35.8 C), Max:103.4 F (39.7 C)  Propofol: off since this morning   Labs reviewed. Sodium 134 (L), potassium 3.2 (L), BUN 23 (H) CBG's: [phone]  Medications reviewed and include potassium chloride, decadron.  Current weight was taken with arctic sun pads on. Three months ago patient weighed 119.3 kg. Patient has likely had some weight loss, unsure of exact amount of weight loss. Weight changes likely related to fluid shifts from CHF.   Per RN documentation, patient has +2 moderate pitting edema to BLE.  NUTRITION - FOCUSED PHYSICAL EXAM:  unable to complete due to COVID restrictions, working remotely  Diet Order:   Diet Order            Diet NPO time specified  Diet effective now              EDUCATION NEEDS:   Not appropriate for education at this time  Skin:  Skin Assessment: Skin Integrity Issues: Skin Integrity Issues:: Stage II Stage II: R buttocks  Last BM:  PTA  Height:   Ht Readings from Last 1 Encounters:  02/21/2019 6\' 1"  (1.854 m)    Weight:   Wt Readings  from Last 1 Encounters:  02/17/19 116.6 kg    Ideal Body Weight:  83.6 kg  BMI:  Body mass index is 33.91 kg/m.  Estimated Nutritional Needs:   Kcal:  1700-2000  Protein:  >/= 167 gm  Fluid:  2 L    Molli Barrows, RD, LDN, [place] Pager [phone] After Hours Pager [phone]

## 2019-02-17 NOTE — Progress Notes (Addendum)
Trying to spike fever.Water temps low despite trying to counter warm. Unable to consistently meet core 36. Currently 36.4. Order obtained for scheduled tylenol.   1700: back to goal. Remains NSR, occasional PVC. SBP >90, MAP >70. Fentanyl, prn versed pushes. GCS 3-6. Responds to painful stimuli occasionally. Non purposeful movement x 4 extremities. ? Posturing LUE. PERRL 5, sluggish. Weak gag, no cough. K replaced, LR infusing..repeat BMP 2200

## 2019-02-17 NOTE — Plan of Care (Signed)
  Problem: Cardiac: Goal: Ability to achieve and maintain adequate cardiopulmonary perfusion will improve Outcome: Progressing   Problem: Neurologic: Goal: Promote progressive neurologic recovery Outcome: Progressing   Problem: Skin Integrity: Goal: Risk for impaired skin integrity will be minimized. Outcome: Progressing   Problem: Clinical Measurements: Goal: Ability to maintain clinical measurements within normal limits will improve Outcome: Progressing Goal: Diagnostic test results will improve Outcome: Progressing Goal: Cardiovascular complication will be avoided Outcome: Progressing   Problem: Elimination: Goal: Will not experience complications related to urinary retention Outcome: Progressing

## 2019-02-17 NOTE — Progress Notes (Signed)
NAME:  Aaron [name], MRN:  [id number], DOB:  17-Aug-1965, LOS: 1 ADMISSION DATE:  03/17/19, CONSULTATION DATE:  Mar 17, 2019 REFERRING MD:  ER , CHIEF COMPLAINT:  Cardiac arrest   Brief History   53 year old male from home with prolonged cardiac arrest from home found to be COVID positive.   History of present illness   HPI obtained from medical chart review and per wife by phone as patient is intubated and unresponsive on sedation.   53 year old male with history of systolic HF/ ICM with EF 33%, PAF on eliquis (not taking around 3 weeks), severe CAD s/p PCI in 10/2018, IDA, protein-calorie malnutrition, obesity, and debility.  Patient is wheelchair bound at home.    Wife reports patient has been in his normal state of health until today.  At baseline, patient is wheelchair bound.  Has not left the house in over 2 weeks.  Wife cares for patient and she works at [employer].  No family members in the house symptomatic or with known COVID.  Today, patient complained of mild fatigue and no appetite.  Denies any fever, nausea, vomiting, diarrhea, chest pain or shortness of breath.  Wife does state that patient ran out of his eliquis around 3 weeks ago.   Wife reports hearing a gurgling sound, noted that he had brief seizure like activity, then went apneic and pulseless.  CPR immediately started by wife.  Found to be in PEA by EMS with ACLS measures and episode of torsades.  Initially required epi drip with EMS since off in ER.  ROSC obtained after 24 minutes.  On arrival to ER, patient remained unresponsive and therefore intubated for airway protection.  Labs noted for K 2.9, Na 139, CO2 18, glucose 167, BUN 23, sCr 1.48 (prior 1.03 in 11/2018), AST 171, ALT 125, Mag 1.9, COVID positive, hs troponin 108, WBC 3, Hgb 10.7, Hct 33.1.  CXR noted for cardiomegaly with pulmonary vascular congestion and left basilar atelectasis.  He has been febrile with temp 101.3 rectal, and normotensive, not requiring current  vasopressors.  PCCM called for admit.    Past Medical History  Systolic HF/ ICM with EF 82%, PAF on eliquis (not taking around 3 weeks), severe CAD s/p PCI in 10/2018, IDA, protein-calorie malnutrition, obesity, debility  Significant Hospital Events   7/21 Admit  Consults:   Procedures:  7/21 ETT >>  Significant Diagnostic Tests:   Micro Data:  7/21  SARS CoV-2>> positive  7/21 BC x 2 >>  7/21 UC >>  Antimicrobials:  7/21 cefepime 7/21 flagyl  7/21 vancomycin   Interim history/subjective:  On propofol 10 mcg/kg/ min  Objective   Blood pressure 112/85, pulse 68, temperature (!) 96.8 F (36 C), temperature source Bladder, resp. rate 13, height 6\' 1"  (1.854 m), weight 116.6 kg, SpO2 100 %.    Vent Mode: SIMV;PSV;Other (Comment) FiO2 (%):  [40 %-100 %] 40 % Set Rate:  [8 bmp-24 bmp] 8 bmp Vt Set:  [630 mL] 630 mL PEEP:  [5 cmH20-8 cmH20] 8 cmH20 Plateau Pressure:  [16 cmH20-21 cmH20] 21 cmH20   Intake/Output Summary (Last 24 hours) at 02/17/2019 0836 Last data filed at 02/17/2019 0610 Gross per 24 hour  Intake 2890.48 ml  Output 395 ml  Net 2495.48 ml   Filed Weights   Mar 17, 2019 1900 02/17/19 0200  Weight: 129.7 kg 116.6 kg   Examination: GEN: chronically ill appearing man unresponsive on vent HEENT: ETT in place, minimal secretions CV: RRR, ext lukewarm PULM:  Scattered rhonci, no accessory muscle use GI: Soft, hypoactive BS EXT: Trace edema NEURO: With extreme noxious stimuli I can get him to have flexion response with both feet, his cough and gag seem intact as does his respiratory drive, he has spontaneous eye opening and upward gaze preference PSYCH: RASS 0 SKIN: No rashes   Resolved Hospital Problem list    Assessment & Plan:  # Prolonged PEA cardiac arrest in wheelchair bound patient- portends poor prognosis.  Etiology in this setting is likely arrhythmia given medical history possibly induced by COVID infection, initial rhythm PEA by time EMS arrived.   EKG without ischemic changes. # COVID +, CXR benign # Underlying advanced ICM, debility, weakness, protein calorie malnutrition, vasoplegia # Mild AKI improved with IVF # Elevated inflammatory markers in setting of COVID infection, steroids fine but I think more advanced therapies are going to be futile in this specific clinical scenario  - Stop antibiotics, I see no evidence of bacterial infection - Continue supportive care TTM - Echo, EEG pending - Gentle IVF, start TF - Vent support, VAP bundle, I have adjusted to SIMV to help with double stacking and patient comfort - Full dose lovenox given hx of Afib and elevated D dimer in setting of COVID - Keep K/Mg normal, telemetry monitoring, no QT prolonging agents - Prognostication per usual post arrest care but given premorbid performance status not seeing a great outcome here   Best practice:  Diet: TF Pain/Anxiety/Delirium protocol (if indicated ): fentanyl/propofol ordered, currently only fentanyl on and is being titrated to patient comfort on vent VAP protocol (if indicated): yes DVT prophylaxis: full dose lovenox GI prophylaxis: pepcid Glucose control: CBG q 4, add SSI if >180 Mobility: BR Code Status: limited, no CPR, but wife ok with other medical care for now Family Communication: Wife, Elita Quick [phone], updated today.  Understands high chance he will never wake up.  DNR. Disposition: ICU, [facility] per bedboard, don't really see any changes in management we would be doing over there  33 minutes CC time not including separately billable procedures.  Myrla Halsted MD

## 2019-02-17 NOTE — Procedures (Signed)
Arterial Catheter Insertion Procedure Note Aaron [name] [id number] Dec 20, 1965  Procedure: Insertion of Arterial Catheter  Indications: Blood pressure monitoring and Frequent blood sampling  Procedure Details Consent: Unable to obtain consent because of altered level of consciousness. Time Out: Verified patient identification, verified procedure, site/side was marked, verified correct patient position, special equipment/implants available, medications/allergies/relevent history reviewed, required imaging and test results available.  Performed  Maximum sterile technique was used including antiseptics, cap, gloves, gown, hand hygiene, mask and sheet. Skin prep: Chlorhexidine; local anesthetic administered 20 gauge catheter was inserted into right radial artery using the Seldinger technique. ULTRASOUND GUIDANCE USED: NO Evaluation Blood flow good; BP tracing good. Complications: No apparent complications.   Aaron [name] 02/17/2019

## 2019-02-17 NOTE — Progress Notes (Signed)
  Echocardiogram 2D Echocardiogram has been performed.  Jannett Celestine 02/17/2019, 10:24 AM

## 2019-02-17 NOTE — ED Notes (Addendum)
Wife: Sebastian Lurz, [phone], updated on room assignment.

## 2019-02-17 NOTE — Procedures (Signed)
Patient Name: Aaron [name]  MRN: [id number]  Epilepsy Attending: Lora Havens  Referring Physician/Provider: Dr Karma Ganja Date: 02/17/19  Duration: 26.39 mins  Patient history: 53 year old male with multiple medical problems including coronary artery disease, CHF with most recent EF of 15%, paroxysmal atrial fibrillation not on anticoagulation due to history of GI bleeding who presented to ED yesterday post cardiac arrest initially PEA followed by torsades requiring CPR and shock x1.  Level of alertness: comatose  AEDs during EEG study: Propofol, versed  Technical aspects: This EEG study was done with scalp electrodes positioned according to the 10-20 International system of electrode placement. Electrical activity was acquired at a sampling rate of 500Hz  and reviewed with a high frequency filter of 70Hz  and a low frequency filter of 1Hz . EEG data were recorded continuously and digitally stored.   BACKGROUND ACTIVITY:       Slowing: There is continuous slowing, generalized.   EPILEPTIFORM ACTIVITY: Interictal epileptiform activity: None  Ictal Activity: None  OTHER EVENTS: None  ACTIVATION PROCEDURES:  Hyperventilation and photic stimulation were not performed.  IMPRESSION: This study is suggestive of severe diffuse encephalopathy. No seizures or epileptiform discharges were seen throughout the recording.   [name] Barbra Sarks

## 2019-02-17 NOTE — Progress Notes (Signed)
EEG complete - results pending 

## 2019-02-17 NOTE — Progress Notes (Signed)
While performing mouth care, patient became agitated, biting ETT, dysynchronous with vent, and had rhythmic inward motion of R arm. Pupils still EERL. Increased sedation with no resolve. NP to bedside to assess, new orders given for PRN fentanyl and versed.

## 2019-02-18 ENCOUNTER — Inpatient Hospital Stay (HOSPITAL_COMMUNITY): Payer: Medicaid Other

## 2019-02-18 LAB — URINE CULTURE: Culture: <10000 — AB

## 2019-02-18 LAB — BASIC METABOLIC PANEL
Anion gap: 14 (ref 5–15)
BUN: 39 mg/dL — ABNORMAL HIGH (ref 6–20)
CO2: 17 mmol/L — ABNORMAL LOW (ref 22–32)
Calcium: 8.7 mg/dL — ABNORMAL LOW (ref 8.9–10.3)
Chloride: 104 mmol/L (ref 98–111)
Creatinine, Ser: 1.57 mg/dL — ABNORMAL HIGH (ref 0.61–1.24)
GFR calc Af Amer: 57 mL/min — ABNORMAL LOW (ref >60)
GFR calc non Af Amer: 49 mL/min — ABNORMAL LOW (ref >60)
Glucose, Bld: 87 mg/dL (ref 70–99)
Potassium: 4.5 mmol/L (ref 3.5–5.1)
Sodium: 135 mmol/L (ref 135–145)

## 2019-02-18 LAB — LACTIC ACID, PLASMA: Lactic Acid, Venous: 2.3 mmol/L (ref 0.5–1.9)

## 2019-02-18 LAB — CBC
HCT: 36.3 % — ABNORMAL LOW (ref 39.0–52.0)
Hemoglobin: 11.9 g/dL — ABNORMAL LOW (ref 13.0–17.0)
MCH: 30.1 pg (ref 26.0–34.0)
MCHC: 32.8 g/dL (ref 30.0–36.0)
MCV: 91.7 fL (ref 80.0–100.0)
Platelets: 157 10*3/uL (ref 150–400)
RBC: 3.96 MIL/uL — ABNORMAL LOW (ref 4.22–5.81)
RDW: 13.8 % (ref 11.5–15.5)
WBC: 2.3 10*3/uL — ABNORMAL LOW (ref 4.0–10.5)
nRBC: 0 % (ref 0.0–0.2)

## 2019-02-18 LAB — GLUCOSE, CAPILLARY
Glucose-Capillary: 115 mg/dL — ABNORMAL HIGH (ref 70–99)
Glucose-Capillary: 119 mg/dL — ABNORMAL HIGH (ref 70–99)
Glucose-Capillary: 121 mg/dL — ABNORMAL HIGH (ref 70–99)
Glucose-Capillary: 137 mg/dL — ABNORMAL HIGH (ref 70–99)
Glucose-Capillary: 86 mg/dL (ref 70–99)
Glucose-Capillary: 86 mg/dL (ref 70–99)

## 2019-02-18 LAB — PHOSPHORUS
Phosphorus: 4.9 mg/dL — ABNORMAL HIGH (ref 2.5–4.6)
Phosphorus: 3.3 mg/dL (ref 2.5–4.6)

## 2019-02-18 LAB — MAGNESIUM: Magnesium: 2 mg/dL (ref 1.7–2.4)

## 2019-02-18 LAB — INTERLEUKIN-6, PLASMA: Interleukin-6, Plasma: 154.1 pg/mL — ABNORMAL HIGH (ref 0.0–12.2)

## 2019-02-18 MED ORDER — FENTANYL CITRATE (PF) 100 MCG/2ML IJ SOLN: 50.0000 ug | INTRAMUSCULAR | Status: DC | PRN

## 2019-02-18 MED ORDER — SODIUM CHLORIDE 0.9 % IV BOLUS
500.0000 mL | Freq: Once | INTRAVENOUS | Status: AC
  Administered 2019-02-18: 500 mL via INTRAVENOUS

## 2019-02-18 MED ORDER — MIDAZOLAM HCL 2 MG/2ML IJ SOLN
2.0000 mg | Freq: Once | INTRAMUSCULAR | Status: AC
  Administered 2019-02-18: 2 mg via INTRAVENOUS
  Filled 2019-02-18: qty 2

## 2019-02-18 MED ORDER — SODIUM CHLORIDE 0.9 % IV SOLN
200.0000 mg | Freq: Four times a day (QID) | INTRAVENOUS | Status: DC | PRN
  Filled 2019-02-18: qty 2

## 2019-02-18 NOTE — Progress Notes (Addendum)
NAME:  Aaron [name], MRN:  [id number], DOB:  09-Feb-1966, LOS: 2 ADMISSION DATE:  13-Mar-2019, CONSULTATION DATE:  03/13/19 REFERRING MD:  ER , CHIEF COMPLAINT:  Cardiac arrest   Brief History   53 year old male from home with prolonged cardiac arrest from home found to be COVID positive.   History of present illness   HPI obtained from medical chart review and per wife by phone as patient is intubated and unresponsive on sedation.   53 year old male with history of systolic HF/ ICM with EF 15%, PAF on eliquis (not taking around 3 weeks), severe CAD s/p PCI in 10/2018, IDA, protein-calorie malnutrition, obesity, and debility.  Patient is wheelchair bound at home.    Wife reports patient has been in his normal state of health until today.  At baseline, patient is wheelchair bound.  Has not left the house in over 2 weeks.  Wife cares for patient and she works at [employer].  No family members in the house symptomatic or with known COVID.  Today, patient complained of mild fatigue and no appetite.  Denies any fever, nausea, vomiting, diarrhea, chest pain or shortness of breath.  Wife does state that patient ran out of his eliquis around 3 weeks ago.   Wife reports hearing a gurgling sound, noted that he had brief seizure like activity, then went apneic and pulseless.  CPR immediately started by wife.  Found to be in PEA by EMS with ACLS measures and episode of torsades.  Initially required epi drip with EMS since off in ER.  ROSC obtained after 24 minutes.  On arrival to ER, patient remained unresponsive and therefore intubated for airway protection.  Labs noted for K 2.9, Na 139, CO2 18, glucose 167, BUN 23, sCr 1.48 (prior 1.03 in 11/2018), AST 171, ALT 125, Mag 1.9, COVID positive, hs troponin 108, WBC 3, Hgb 10.7, Hct 33.1.  CXR noted for cardiomegaly with pulmonary vascular congestion and left basilar atelectasis.  He has been febrile with temp 101.3 rectal, and normotensive, not requiring current  vasopressors.  PCCM called for admit.    Past Medical History  Systolic HF/ ICM with EF 15%, PAF on eliquis (not taking around 3 weeks), severe CAD s/p PCI in 10/2018, IDA, protein-calorie malnutrition, obesity, debility  Significant Hospital Events   7/21 Admit  Consults:   Procedures:  7/21 ETT >> 7/23 cvl left I j >>  Significant Diagnostic Tests:   Micro Data:  7/21  SARS CoV-2>> positive  7/21 BC x 2 >>  7/21 UC >>  Antimicrobials:  7/21 cefepime>>7/22 7/21 flagyl >>7/22 7/21 vancomycin >>7/22  Interim history/subjective:  Central line placed.  Noted to be dry.  Objective   Blood pressure (!) 141/77, pulse 93, temperature 98.6 F (37 C), resp. rate (!) 23, height 6\' 1"  (1.854 m), weight 115.1 kg, SpO2 100 %.    Vent Mode: SIMV;PSV FiO2 (%):  [40 %] 40 % Set Rate:  [8 bmp] 8 bmp Vt Set:  [630 mL] 630 mL PEEP:  [8 cmH20] 8 cmH20   Intake/Output Summary (Last 24 hours) at 02/18/2019 0835 Last data filed at 02/18/2019 0700 Gross per 24 hour  Intake 3938.45 ml  Output 350 ml  Net 3588.45 ml   Filed Weights   2019/03/13 1900 02/17/19 0200 02/18/19 0500  Weight: 129.7 kg 116.6 kg 115.1 kg   Examination: General: Disheveled ill-appearing male HEENT: No JVD is appreciated, endotracheal tube secured in place gastric tube is in place Neuro:  Poorly responsive.  She does have upward gaze preference, noted to be sure CV: Sounds are distant PULM: even/non-labored, lungs bilaterally managed throughout GN:FAOZ, non-tender, increased bowel sounds Extremities: warm/dry, 2+ edema chronic ischemic changes Skin: no rashes or lesions   Resolved Hospital Problem list    Assessment & Plan:  #Dependent respiratory failure secondary to PEA cardiac arrest and patient has a known ischemic cardiomyopathy EF of 15% Vent bundle Changes as needed Serial chest x-ray Bronchodilators   # Prolonged PEA cardiac arrest in wheelchair bound patient- portends poor prognosis.  Etiology in  this setting is likely arrhythmia given medical history possibly induced by COVID infection, initial rhythm PEA by time EMS arrived.  EKG without ischemic changes. 02/18/2019 8:54 AM discontinue normal thermia protocol at this time. Currently DNR Gentle hydration Check CVP  # COVID +, CXR benign Currently on IV Decadron  Encephalopathic status post cardiac arrest with communication CPR EEG 02/15/2019 consistent with encephalopathic state 02/17/2019 CT head with no acute intracranial hemorrhage but does show paranasal sinus disease Continue to monitor encephalopathic state now resolved normothermia protocol decrease sedation.  # Underlying advanced ICM, debility, weakness, protein calorie malnutrition, vasoplegia Continue to monitor  # Mild AKI improved with IVF Lab Results  Component Value Date   CREATININE 1.57 (H) 02/18/2019   CREATININE 1.40 (H) 02/17/2019   CREATININE 1.15 02/17/2019  Monitor creatinine Avoid nephrotoxins  # Elevated inflammatory markers in setting of COVID infection, steroids fine but I think more advanced therapies are going to be futile in this specific clinical scenario No indication for antimicrobial therapy Antibiotics stopped Continue supportive care currently on normothermia Monitor CBC    Poor prognosis  Best practice:  Diet: TF Pain/Anxiety/Delirium protocol (if indicated ): fentanyl/propofol ordered, currently only fentanyl on and is being titrated to patient comfort on vent VAP protocol (if indicated): yes DVT prophylaxis: full dose lovenox GI prophylaxis: pepcid Glucose control: CBG q 4, add SSI if >180 Mobility: BR Code Status: limited, no CPR, but wife ok with other medical care for now Family Communication: Wife, Jeannene Patella [phone], updated today.  Understands high chance he will never wake up.  DNR.  02/18/2011 wife contacted for permission to place central line emaciated. Disposition: ICU, [facility] per bedboard, don't really see any changes in  management we would be doing over there.   App cct 45 min Richardson Landry Shayan Bramhall ACNP Maryanna Shape PCCM Pager [phone] till 1 pm If no answer page [phone] 02/18/2019, 8:36 AM

## 2019-02-18 NOTE — Progress Notes (Signed)
Patient at this time no longer needs PIV, Central line access obtained

## 2019-02-18 NOTE — Progress Notes (Signed)
Patient temp is 103, gave tylenol and placed bags of ice all over. Patient temp is still continuing to rise and will place cooling blanket. CCM Dr. Tamala Julian is aware.

## 2019-02-18 NOTE — Progress Notes (Signed)
[facility] Progress Note Patient Name: Aaron [name] DOB: 1966-03-01 MRN: [id number]   Date of Service  02/18/2019  HPI/Events of Note  Fever unresponsive to cooling blanket and Tylenol, pt is in a coma with a poor prognosis s/p prolonged cardiac arrest.  eICU Interventions  Ibuprofen 200 mg iv Q 6 hours prn temp > 101 x 4 doses then d/c        Okoronkwo U Ogan 02/18/2019, 11:42 PM

## 2019-02-18 NOTE — Plan of Care (Signed)
  Problem: Cardiac: Goal: Ability to achieve and maintain adequate cardiopulmonary perfusion will improve Outcome: Progressing   Problem: Neurologic: Goal: Promote progressive neurologic recovery Outcome: Not Progressing   Problem: Skin Integrity: Goal: Risk for impaired skin integrity will be minimized. Outcome: Progressing   Problem: Clinical Measurements: Goal: Ability to maintain clinical measurements within normal limits will improve Outcome: Progressing Goal: Respiratory complications will improve Outcome: Progressing Goal: Cardiovascular complication will be avoided Outcome: Progressing   Problem: Nutrition: Goal: Adequate nutrition will be maintained Outcome: Progressing   Problem: Safety: Goal: Ability to remain free from injury will improve Outcome: Progressing

## 2019-02-18 NOTE — Progress Notes (Signed)
Patient is now maintaining a temp of 36 degrees Celsius but pt is still shivering after giving Fentanyl bolus and versed pushes. Verbal orders were to restart Propofol. Will continue to monitor.

## 2019-02-18 NOTE — Procedures (Signed)
Central Venous Catheter Insertion Procedure Note Bryar Dahms [id number] 1966/03/05  Procedure: Insertion of Central Venous Catheter Indications: Assessment of intravascular volume, Drug and/or fluid administration and Frequent blood sampling  Procedure Details Consent: Risks of procedure as well as the alternatives and risks of each were explained to the (patient/caregiver).  Consent for procedure obtained. Time Out: Verified patient identification, verified procedure, site/side was marked, verified correct patient position, special equipment/implants available, medications/allergies/relevent history reviewed, required imaging and test results available.  Performed  Maximum sterile technique was used including antiseptics, cap, gloves, gown, hand hygiene, mask and sheet. Skin prep: Chlorhexidine; local anesthetic administered A antimicrobial bonded/coated triple lumen catheter was placed in the left internal jugular vein using the Seldinger technique. Ultrasound guidance used.Yes.   Catheter placed to 20 cm. Blood aspirated via all 3 ports and then flushed x 3. Line sutured x 2 and dressing applied.  Evaluation Blood flow good Complications: No apparent complications Patient did tolerate procedure well. Chest X-ray ordered to verify placement.  CXR: pending.  Richardson Landry Shaquia Berkley ACNP Maryanna Shape PCCM Pager [phone] till 1 pm If no answer page [phone] 02/18/2019, 8:33 AM

## 2019-02-18 NOTE — Progress Notes (Signed)
eLink Physician-Brief Progress Note Patient Name: Aaron [name] DOB: 1965-12-24 MRN: [id number]   Date of Service  02/18/2019  HPI/Events of Note  A 53 year old male admitted to the ICU status post last cardiac arrest on hypothermia protocol.   eICU Interventions  Had difficulty to maintain a temperature of 36 C.  Ordered extra ice packs to be placed on the side of the neck.  Give extra dose of Versed for shivering.  Ordered one-time IV Tylenol and decreased side temperature on the cooling blankets.  We are able to bring down the rectal temperature to 36.1 C.      Intervention Category Intermediate Interventions: Communication with other healthcare providers and/or family;Medication change / dose adjustment  Mady Gemma 02/18/2019, 12:33 AM

## 2019-02-18 NOTE — Progress Notes (Addendum)
Patient is still shivering and maintaining temp of 37.5 degrees Celsius. Fentanyl bolus and 2 mg of Versed IV was given. Fentanyl infusion increased to 200 mcg.  MD states to given an additional dose of Versed within 30 minutes if shivering is not resolved and to continue giving Fentanyl boluses as needed. Will continue to monitor for any changes.

## 2019-02-18 NOTE — Progress Notes (Signed)
Family Update: Updated sister and Spouse. All questions answered at this time. Received consent from wife for CVC placement.

## 2019-02-18 NOTE — Progress Notes (Signed)
MD gave verbal orders to apply more ice packs and apply cooling blanket. IV Tylenol was also ordered. Will continue to monitor

## 2019-02-19 ENCOUNTER — Inpatient Hospital Stay (HOSPITAL_COMMUNITY): Payer: Medicaid Other

## 2019-02-19 LAB — CBC
HCT: 32.7 % — ABNORMAL LOW (ref 39.0–52.0)
Hemoglobin: 10.9 g/dL — ABNORMAL LOW (ref 13.0–17.0)
MCH: 30.7 pg (ref 26.0–34.0)
MCHC: 33.3 g/dL (ref 30.0–36.0)
MCV: 92.1 fL (ref 80.0–100.0)
Platelets: 142 10*3/uL — ABNORMAL LOW (ref 150–400)
RBC: 3.55 MIL/uL — ABNORMAL LOW (ref 4.22–5.81)
RDW: 14.3 % (ref 11.5–15.5)
WBC: 0.8 10*3/uL — CL (ref 4.0–10.5)
nRBC: 0 % (ref 0.0–0.2)

## 2019-02-19 LAB — BASIC METABOLIC PANEL
Anion gap: 14 (ref 5–15)
BUN: 53 mg/dL — ABNORMAL HIGH (ref 6–20)
CO2: 16 mmol/L — ABNORMAL LOW (ref 22–32)
Calcium: 7.9 mg/dL — ABNORMAL LOW (ref 8.9–10.3)
Chloride: 106 mmol/L (ref 98–111)
Creatinine, Ser: 1.59 mg/dL — ABNORMAL HIGH (ref 0.61–1.24)
GFR calc Af Amer: 56 mL/min — ABNORMAL LOW (ref >60)
GFR calc non Af Amer: 48 mL/min — ABNORMAL LOW (ref >60)
Glucose, Bld: 123 mg/dL — ABNORMAL HIGH (ref 70–99)
Potassium: 2.8 mmol/L — ABNORMAL LOW (ref 3.5–5.1)
Sodium: 136 mmol/L (ref 135–145)

## 2019-02-19 LAB — POCT I-STAT 7, (LYTES, BLD GAS, ICA,H+H)
Acid-base deficit: 10 mmol/L — ABNORMAL HIGH (ref 0.0–2.0)
Bicarbonate: 13.6 mmol/L — ABNORMAL LOW (ref 20.0–28.0)
Calcium, Ion: 1.19 mmol/L (ref 1.15–1.40)
HCT: 30 % — ABNORMAL LOW (ref 39.0–52.0)
Hemoglobin: 10.2 g/dL — ABNORMAL LOW (ref 13.0–17.0)
O2 Saturation: 91 %
Patient temperature: 102.2
Potassium: 2.9 mmol/L — ABNORMAL LOW (ref 3.5–5.1)
Sodium: 137 mmol/L (ref 135–145)
TCO2: 14 mmol/L — ABNORMAL LOW (ref 22–32)
pCO2 arterial: 24.5 mmHg — ABNORMAL LOW (ref 32.0–48.0)
pH, Arterial: 7.362 (ref 7.350–7.450)
pO2, Arterial: 69 mmHg — ABNORMAL LOW (ref 83.0–108.0)

## 2019-02-19 LAB — MRSA CULTURE: Culture: NOT DETECTED

## 2019-02-19 LAB — PHOSPHORUS: Phosphorus: 3.6 mg/dL (ref 2.5–4.6)

## 2019-02-19 LAB — MAGNESIUM: Magnesium: 1.7 mg/dL (ref 1.7–2.4)

## 2019-02-19 LAB — GLUCOSE, CAPILLARY
Glucose-Capillary: 110 mg/dL — ABNORMAL HIGH (ref 70–99)
Glucose-Capillary: 67 mg/dL — ABNORMAL LOW (ref 70–99)

## 2019-02-19 MED ORDER — MORPHINE SULFATE (PF) 4 MG/ML IV SOLN: 4.0000 mg | INTRAVENOUS | Status: DC | PRN

## 2019-02-19 MED ORDER — GLYCOPYRROLATE 0.2 MG/ML IJ SOLN: 0.2000 mg | INTRAMUSCULAR | Status: DC | PRN

## 2019-02-19 MED ORDER — MORPHINE 100MG IN NS 100ML (1MG/ML) PREMIX INFUSION
5.0000 mg/h | INTRAVENOUS | Status: DC
  Administered 2019-02-19: 5 mg/h via INTRAVENOUS
  Filled 2019-02-19: qty 100

## 2019-02-19 MED ORDER — DIPHENHYDRAMINE HCL 50 MG/ML IJ SOLN: 25.0000 mg | INTRAMUSCULAR | Status: DC | PRN

## 2019-02-19 MED ORDER — GLYCOPYRROLATE 1 MG PO TABS: 1.0000 mg | ORAL_TABLET | ORAL | Status: DC | PRN

## 2019-02-19 MED ORDER — NOREPINEPHRINE 4 MG/250ML-% IV SOLN
INTRAVENOUS | Status: AC
  Filled 2019-02-19: qty 250

## 2019-02-19 MED ORDER — PHENYLEPHRINE HCL-NACL 10-0.9 MG/250ML-% IV SOLN
0.0000 ug/min | INTRAVENOUS | Status: DC
  Administered 2019-02-19: 20 ug/min via INTRAVENOUS
  Filled 2019-02-19 (×4): qty 250

## 2019-02-19 MED ORDER — POTASSIUM CHLORIDE 10 MEQ/50ML IV SOLN
10.0000 meq | INTRAVENOUS | Status: AC
  Administered 2019-02-19 (×2): 10 meq via INTRAVENOUS
  Filled 2019-02-19 (×2): qty 50

## 2019-02-19 MED ORDER — ACETAMINOPHEN 325 MG PO TABS: 650.0000 mg | ORAL_TABLET | Freq: Four times a day (QID) | ORAL | Status: DC | PRN

## 2019-02-19 MED ORDER — NOREPINEPHRINE 4 MG/250ML-% IV SOLN: 0.0000 ug/min | INTRAVENOUS | Status: DC

## 2019-02-19 MED ORDER — MAGNESIUM SULFATE 2 GM/50ML IV SOLN
2.0000 g | Freq: Once | INTRAVENOUS | Status: DC
  Filled 2019-02-19: qty 50

## 2019-02-19 MED ORDER — POLYVINYL ALCOHOL 1.4 % OP SOLN
1.0000 [drp] | Freq: Four times a day (QID) | OPHTHALMIC | Status: DC | PRN
  Filled 2019-02-19: qty 15

## 2019-02-19 MED ORDER — ACETAMINOPHEN 650 MG RE SUPP: 650.0000 mg | Freq: Four times a day (QID) | RECTAL | Status: DC | PRN

## 2019-02-21 LAB — CULTURE, BLOOD (ROUTINE X 2)
Culture: NO GROWTH
Culture: NO GROWTH

## 2019-02-23 ENCOUNTER — Telehealth: Payer: Self-pay | Admitting: *Deleted

## 2019-02-23 NOTE — Telephone Encounter (Signed)
Received original D/C from [facility], D/C forwarded to Dr. Tamala Julian (4N) for signature.

## 2019-02-24 NOTE — Telephone Encounter (Signed)
Received original signed D/C, funeral home notified for pick up.  

## 2019-02-27 NOTE — Progress Notes (Signed)
NAME:  Aaron [name], MRN:  [id number], DOB:  10/02/1965, LOS: 3 ADMISSION DATE:  2019-02-24, CONSULTATION DATE:  2019-02-24 REFERRING MD:  ER , CHIEF COMPLAINT:  Cardiac arrest   Brief History   53 year old male from home with prolonged cardiac arrest from home found to be COVID positive.   History of present illness   HPI obtained from medical chart review and per wife by phone as patient is intubated and unresponsive on sedation.   53 year old male with history of systolic HF/ ICM with EF 51%, PAF on eliquis (not taking around 3 weeks), severe CAD s/p PCI in 10/2018, IDA, protein-calorie malnutrition, obesity, and debility.  Patient is wheelchair bound at home.    Wife reports patient has been in his normal state of health until today.  At baseline, patient is wheelchair bound.  Has not left the house in over 2 weeks.  Wife cares for patient and she works at [employer].  No family members in the house symptomatic or with known COVID.  Today, patient complained of mild fatigue and no appetite.  Denies any fever, nausea, vomiting, diarrhea, chest pain or shortness of breath.  Wife does state that patient ran out of his eliquis around 3 weeks ago.   Wife reports hearing a gurgling sound, noted that he had brief seizure like activity, then went apneic and pulseless.  CPR immediately started by wife.  Found to be in PEA by EMS with ACLS measures and episode of torsades.  Initially required epi drip with EMS since off in ER.  ROSC obtained after 24 minutes.  On arrival to ER, patient remained unresponsive and therefore intubated for airway protection.  Labs noted for K 2.9, Na 139, CO2 18, glucose 167, BUN 23, sCr 1.48 (prior 1.03 in 11/2018), AST 171, ALT 125, Mag 1.9, COVID positive, hs troponin 108, WBC 3, Hgb 10.7, Hct 33.1.  CXR noted for cardiomegaly with pulmonary vascular congestion and left basilar atelectasis.  He has been febrile with temp 101.3 rectal, and normotensive, not requiring current  vasopressors.  PCCM called for admit.    Past Medical History  Systolic HF/ ICM with EF 76%, PAF on eliquis (not taking around 3 weeks), severe CAD s/p PCI in 10/2018, IDA, protein-calorie malnutrition, obesity, debility  Significant Hospital Events   7/21 Admit  Consults:   Procedures:  7/21 ETT >> 7/23 cvl left IJ  Significant Diagnostic Tests:  Initial CT head WNL CXR chornic changes  Micro Data:  7/21  SARS CoV-2>> positive  7/21 BC x 2 >> neg 7/21 UC >> neg  Antimicrobials:  7/21 cefepime>>7/22 7/21 flagyl >>7/22 7/21 vancomycin >>7/22  Interim history/subjective:  Took turn for worse overnight now pressor dependent.  Remains unresponsive.  Objective   Blood pressure (!) 95/50, pulse (!) 126, temperature (!) 100.4 F (38 C), resp. rate (!) 30, height 6\' 1"  (1.854 m), weight 115.1 kg, SpO2 96 %. CVP:  [1 mmHg-4 mmHg] 2 mmHg  Vent Mode: SIMV;PSV FiO2 (%):  [40 %] 40 % Set Rate:  [8 bmp] 8 bmp Vt Set:  [630 mL] 630 mL PEEP:  [8 cmH20] 8 cmH20   Intake/Output Summary (Last 24 hours) at 02/25/2019 0832 Last data filed at 02/20/2019 0200 Gross per 24 hour  Intake 2018.24 ml  Output 1665 ml  Net 353.24 ml   Filed Weights   02/24/2019 1900 02/17/19 0200 02/18/19 0500  Weight: 129.7 kg 116.6 kg 115.1 kg   Examination: GEN: chronically ill man  in mild distress HEENT: ETT in place, minimal secretions CV: Tachycardic, ext warm PULM: Coarse BL with + accessory muscle use GI: hypoactive BS, soft EXT: chronic edema NEURO: GCS3T, negative Doll's eyes, + triggering vent, weak gag, pupils sluggish PSYCH: unable to asses SKIN: no rashes  Resolved Hospital Problem list    Assessment & Plan:  # Prolonged cardiac arrest in patient with multiple baseline severe comorbidities (HF/ ICM with EF 15%, PAF on eliquis (not taking around 3 weeks), severe CAD s/p PCI in 10/2018, IDA, protein-calorie malnutrition, obesity, and debility), suspicion its some sort of COVID-induced  arrhythmia.  Has done poorly and now in process of dying.  [place] discussion with wife, she is bringing sons in to say their goodbyes, informed her of COVID + policies at hospital so they will come in one at a time.  Once they are ready we will extubate to comfort with morphine drip and allow Aaron [name] to pass away in peace.   Total of 45 minutes spent on direct patient care.  An additional 15 minutes spent discussing goals of care options and in advance care planning.

## 2019-02-27 NOTE — Progress Notes (Signed)
Late entry: Morphine drip which was started prior to patient's death was wasted and disposed of in the amount of 95cc. This waste was witnessed by Heide Guile, RN.   Myrle Sheng, BSN, RN 354M/54M MICU

## 2019-02-27 NOTE — Progress Notes (Addendum)
eLink Physician-Brief Progress Note Patient Name: Aaron [name] DOB: Jul 22, 1966 MRN: [id number]   Date of Service  03-01-19  HPI/Events of Note  K+ 2.8, Mg 1.7  eICU Interventions  Electrolyte replacement orders placed, I had a discussion with Mrs. [name] Maffia during which I communicated her husbands extremely poor prognosis. She indicated that she wanted to see him. I clarified the visitation policy and communicated same to her.        Kerry Kass Timber Lucarelli 2019/03/01, 6:27 AM

## 2019-02-27 NOTE — Procedures (Signed)
Extubation Procedure Note  Patient Details:   Name: Aaron [name] DOB: Sep 30, 1965 MRN: [id number]   Airway Documentation:    Vent end date: 02/13/2019 Vent end time: 1010   Evaluation  O2 sats: currently acceptable Complications: No apparent complications Patient did tolerate procedure well. Bilateral Breath Sounds: Diminished   No, Terminally weaned patient to room air per MD order  Gonzella Lex 02/20/2019, 10:34 AM

## 2019-02-27 NOTE — Progress Notes (Signed)
   03/02/19 0800  Clinical Encounter Type  Visited With Family  Visit Type Initial;Spiritual support;Critical Care  Referral From Nurse  Consult/Referral To Chaplain  Spiritual Encounters  Spiritual Needs Emotional;Prayer  Stress Factors  Family Stress Factors Health changes;Major life changes;Exhausted  Responded to page for pastoral care for wife of pt. Calling floor, Nurse stated Wife just left room.  After wife left room and waiting in hall area, I met her and she was open for spiritual care. [name](wife) was in tears. She stated that her father is in [place] with covid as well and she is struggling because she said she sees it everywhere. She stated that her she sees it a lot with her job too. She said that she plans to go for a day trip to [place] today to get some fresh air. I offered her spiritual care with empathic listening, ministry of presence, words of comfort and prayer. [name] said she was thankful for hearing her tears of heartache.   Chaplain Fidel Levy  [phone]

## 2019-02-27 NOTE — Discharge Summary (Signed)
Date of Admission: 02/17/19 Date of Death: March 13, 2019 Diagnoses:  Cardiac Arrest COVID infection Ischemic cardiomyopathy Morbid obesity Relative severe protein calorie malnutrition End stage deconditioning  Hospital Course: Patient arrived after prolonged PEA arrest.  Was briefly on TTM but difficult given concomitant COVID infection.  Despite supportive care he continued to deteriorate and after family discussions we allowed him to pass away in peace.  Erskine Emery MD PCCM

## 2019-02-27 NOTE — Progress Notes (Signed)
   03-07-19 1100  Clinical Encounter Type  Visited With Patient;Family;Health care provider  Visit Type Follow-up;Spiritual support;Psychological support;Death;Patient actively dying  Referral From Nurse  Spiritual Encounters  Spiritual Needs Emotional;Prayer;Grief support;Other (Comment) (prayer shaw)  Stress Factors  Patient Stress Factors Not reviewed  Family Stress Factors Major life changes;Loss of control;Loss;Exhausted   Supported family at EOL, then at pt death.  Prayed prayer of commendation at pt bedside at death at request of pt's spouse.    Pt's spouse is experiencing grief and loss of multiple people over close period, supported lament.  Encouraged her to utilize sources of strength and support which she named.  Affirmed her plans to contact them.    Per spouse, she and at least one son were tested for Covid after pt's diagnosis on Tues, but haven't rcvd results yet.  Myra Gianotti resident, [phone]

## 2019-02-27 NOTE — Progress Notes (Signed)
[facility] Progress Note Patient Name: Aaron [name] DOB: 08/25/1965 MRN: [id number]   Date of Service  02/12/2019  HPI/Events of Note  Hypotension and tachycardia  eICU Interventions  Phenylephrine infusion ordered        Frederik Pear 01/28/2019, 5:04 AM

## 2019-02-27 NOTE — Progress Notes (Signed)
Lines and foley catheter removed from patient. Placed in morgue bag along with 1) silver watch, 2) black dog tag, and 3) gold ring. This was witnessed by Heide Guile, RN.  Myrle Sheng, BSN, RN 28M/88M MICU

## 2019-02-27 NOTE — Progress Notes (Signed)
Dr. Tamala Julian had conversation with wife who was able to see patient and made patient DNR. Wife expressed her wishes to withdraw supportive measures and patient was extubated to room air at 1010 per orders. Morphine drip continued, however, all other medications were stopped per orders. No heart tones auscultated, no palpable pulse, patient asystolic at 2536. Patient pronounced by myself and Lorraine Lax. Medical examiner was called and I spoke to Doy Hutching who denied patient as an ME case. CDS was called who also denied patient as eligible for donation.  Myrle Sheng, BSN, RN 67M/80M MICU

## 2019-02-27 DEATH — deceased

## 2019-03-19 ENCOUNTER — Ambulatory Visit: Payer: Medicaid Other | Admitting: Internal Medicine

## 2020-01-26 NOTE — Telephone Encounter (Signed)
Pt is deceased- close this encounter 

## 2020-08-14 IMAGING — US US ABDOMEN LIMITED
1 series · 14 of 25 positions shown · non-contrast
Comparison: None.

CLINICAL DATA: Elevated liver function test.

EXAM:
ULTRASOUND ABDOMEN LIMITED RIGHT UPPER QUADRANT

[Series 1: us abdomen limited · 14 of 75 slices shown]
[im 1/75]
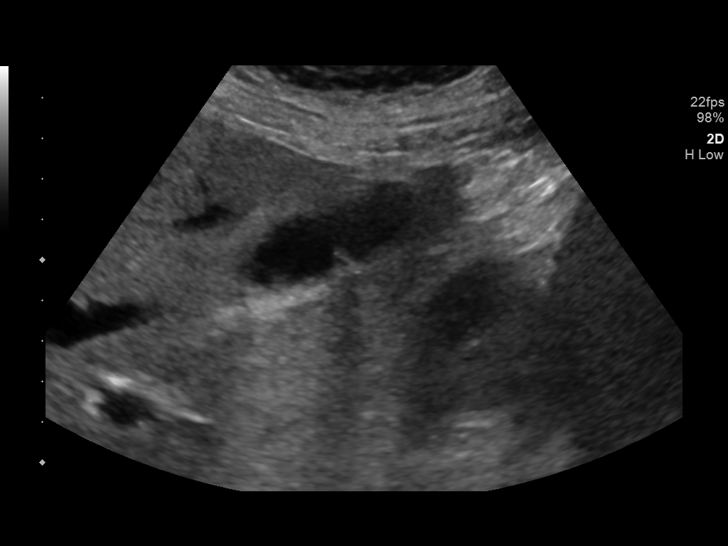
[im 7/75]
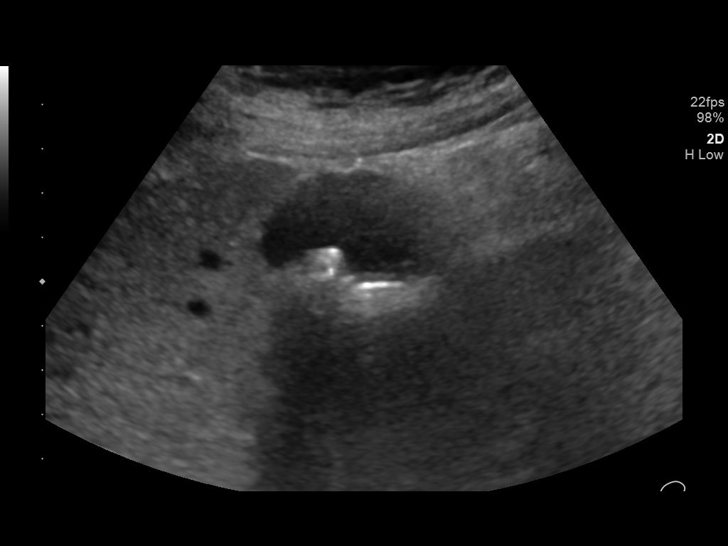
[im 13/75]
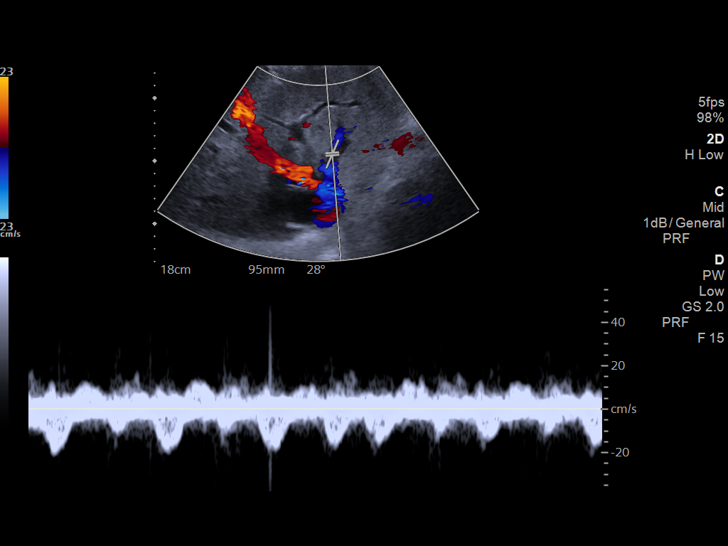
[im 19/75]
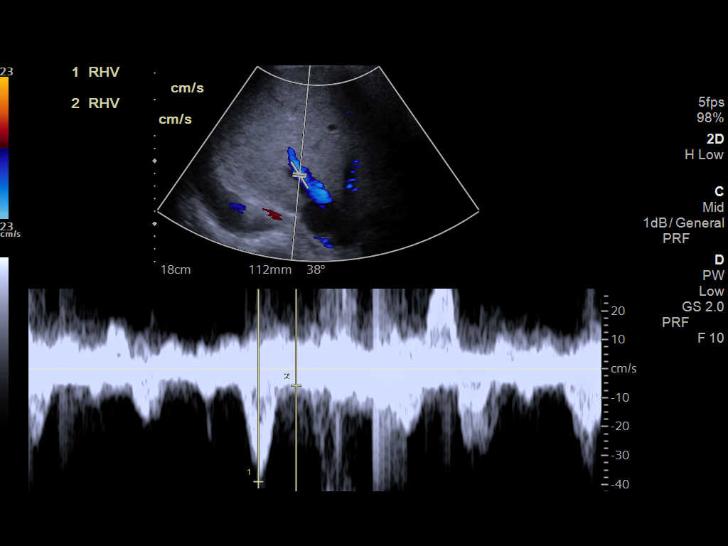
[im 25/75]
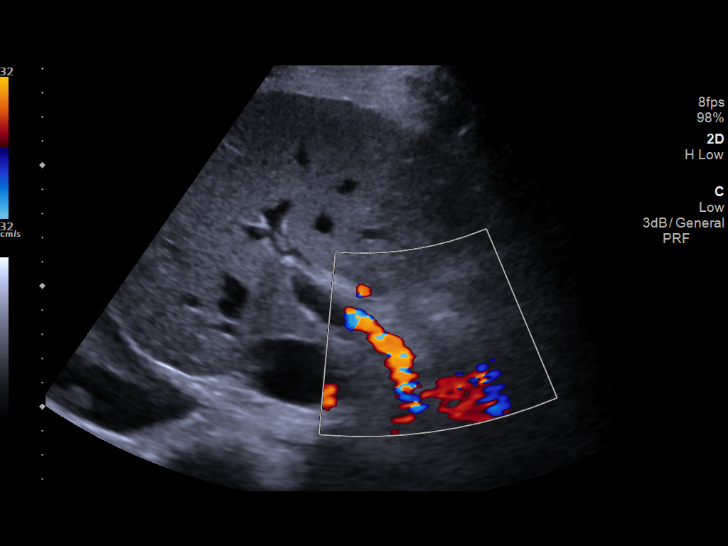
[im 28/75]
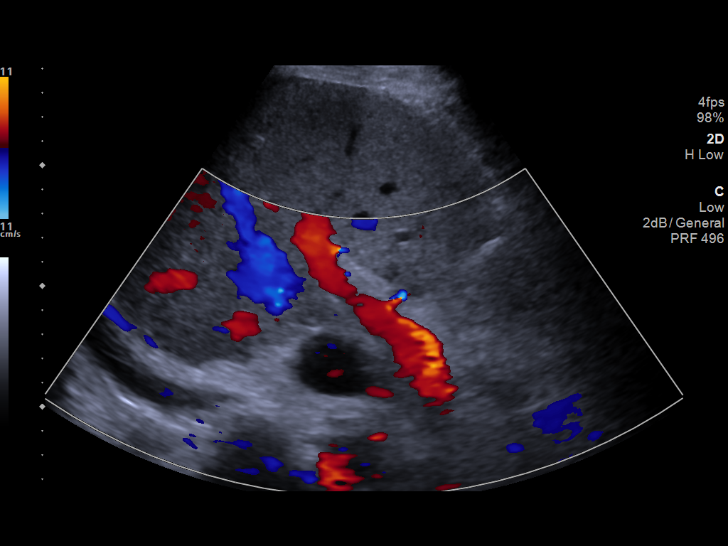
[im 34/75]
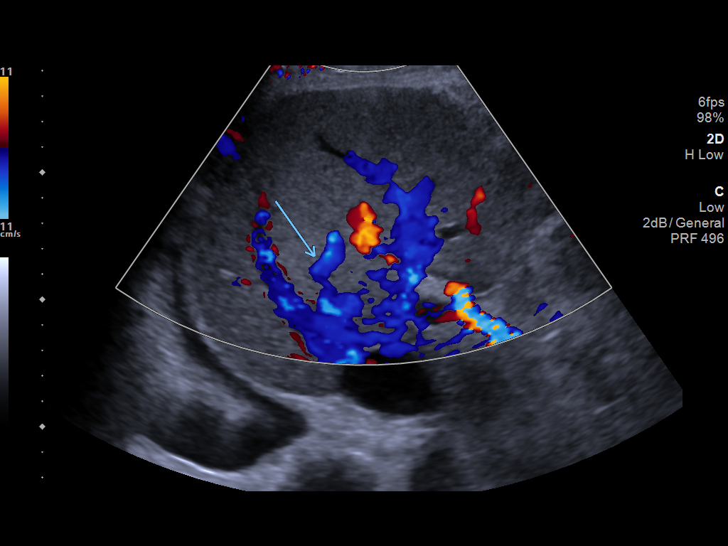
[im 41/75]
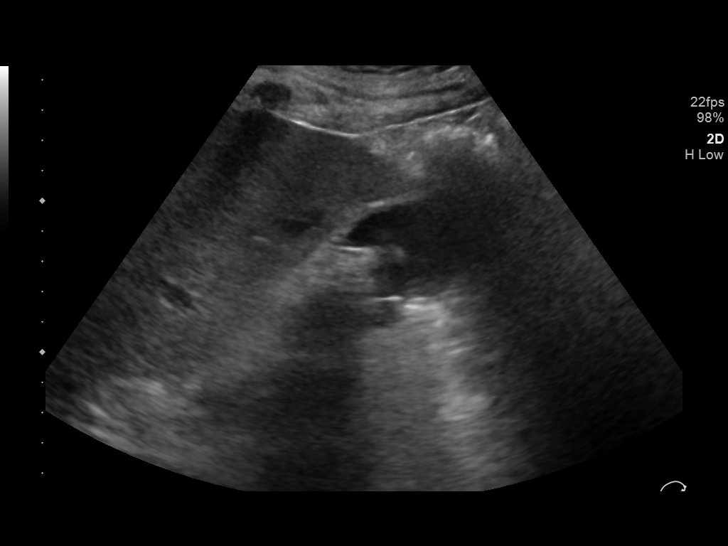
[im 47/75]
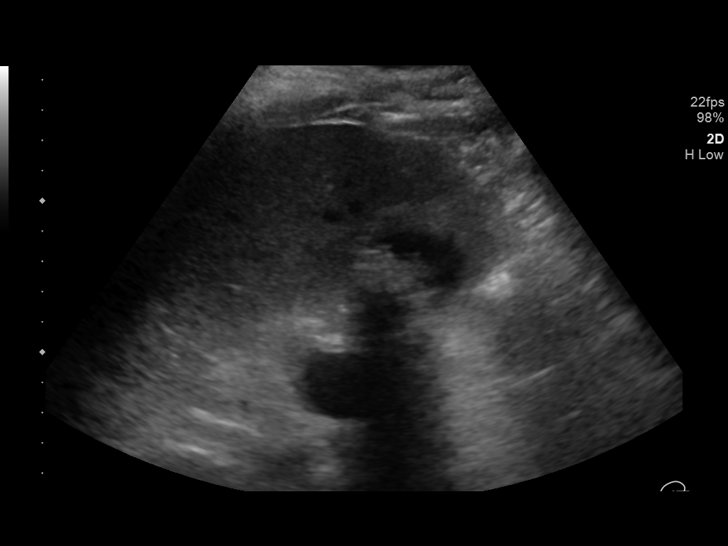
[im 50/75]
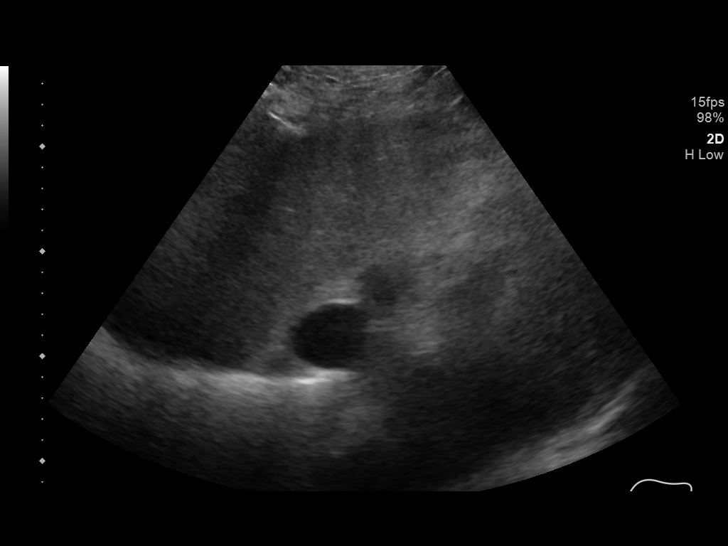
[im 56/75]
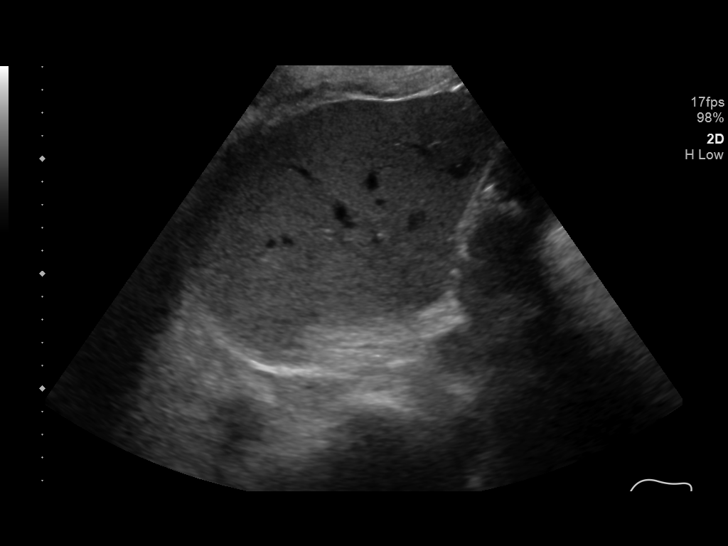
[im 62/75]
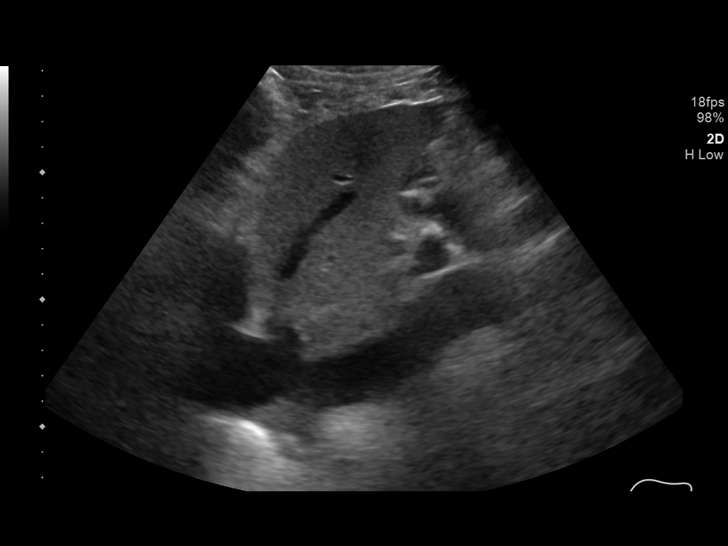
[im 68/75]
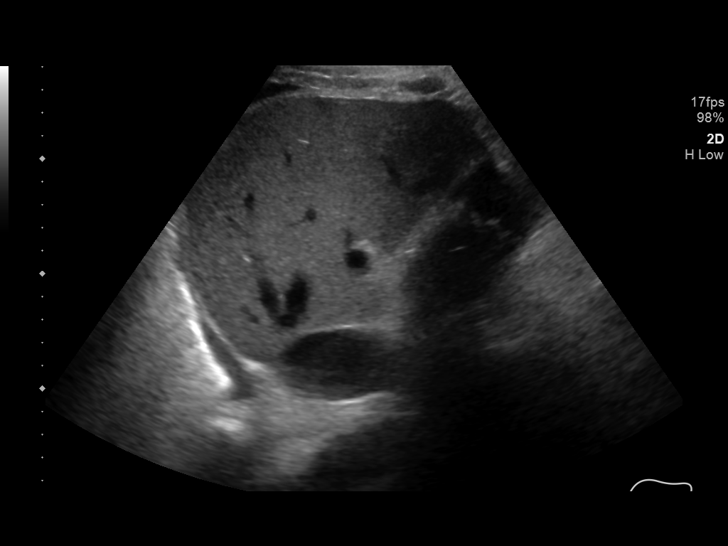
[im 75/75]
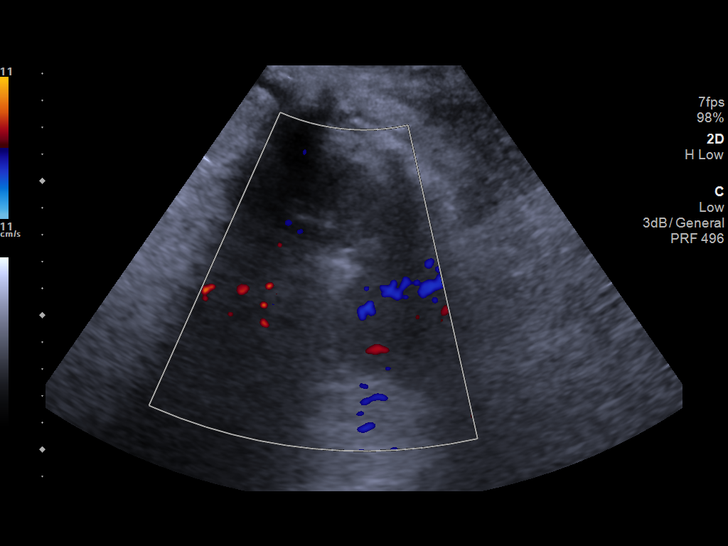

[14 of 25 positions shown; findings below may reference images not displayed]

FINDINGS: Gallbladder:

Dependent mobile gallstones. No wall thickening or pericholecystic
fluid.

Common bile duct:

Diameter: 5-6 mm

Liver:

No focal lesion identified. Mild generalized increased parenchymal
echogenicity. Portal vein is patent on color Doppler imaging with
normal direction of blood flow towards the liver.

Bilateral pleural effusions.
IMPRESSION: 1. No acute findings.
2. Cholelithiasis without acute cholecystitis. No bile duct
dilation.
3. Increased echogenicity of the liver suggests fatty infiltration.

## 2020-09-01 IMAGING — DX PORTABLE ABDOMEN - 1 VIEW
2 series · 2 of 2 positions shown · non-contrast
Comparison: Portable exam 5388 hours compared to 3317 hours

CLINICAL DATA: Repositioning of nasogastric tube

EXAM:
PORTABLE ABDOMEN - 1 VIEW

[abdomen kub (1 of 2)]
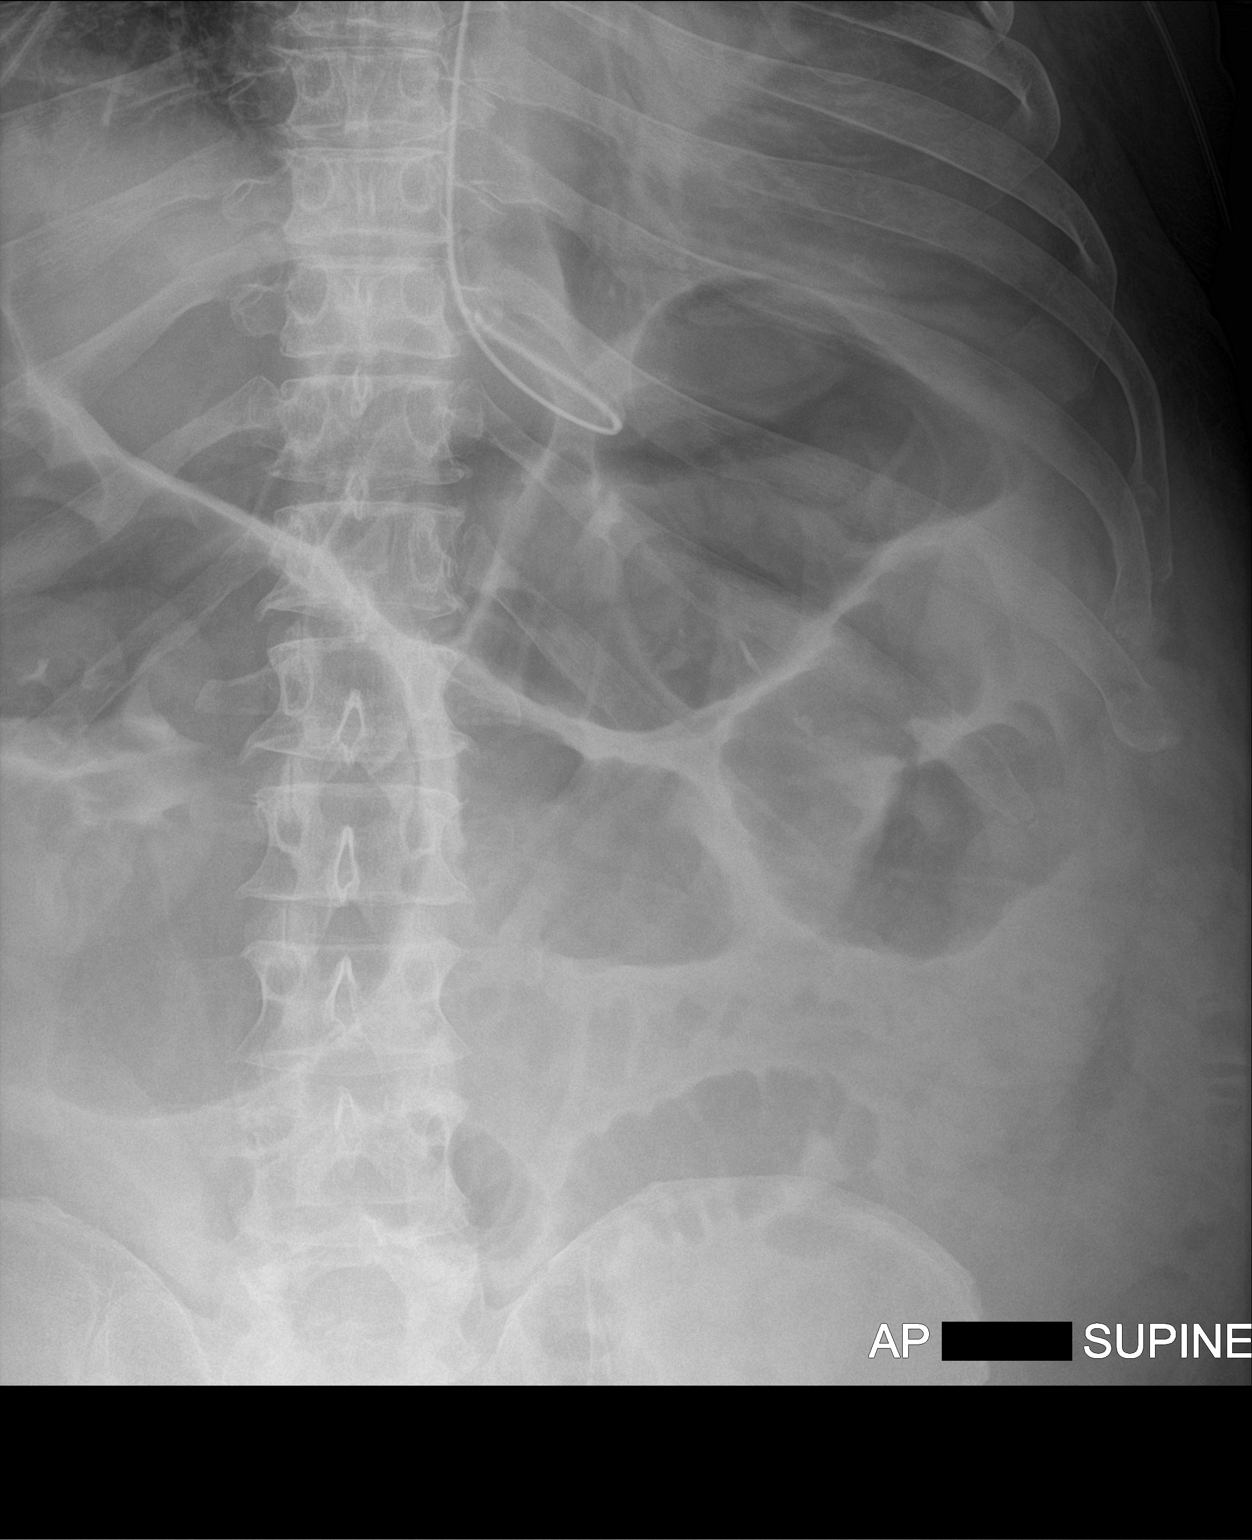

[abdomen kub (2 of 2)]
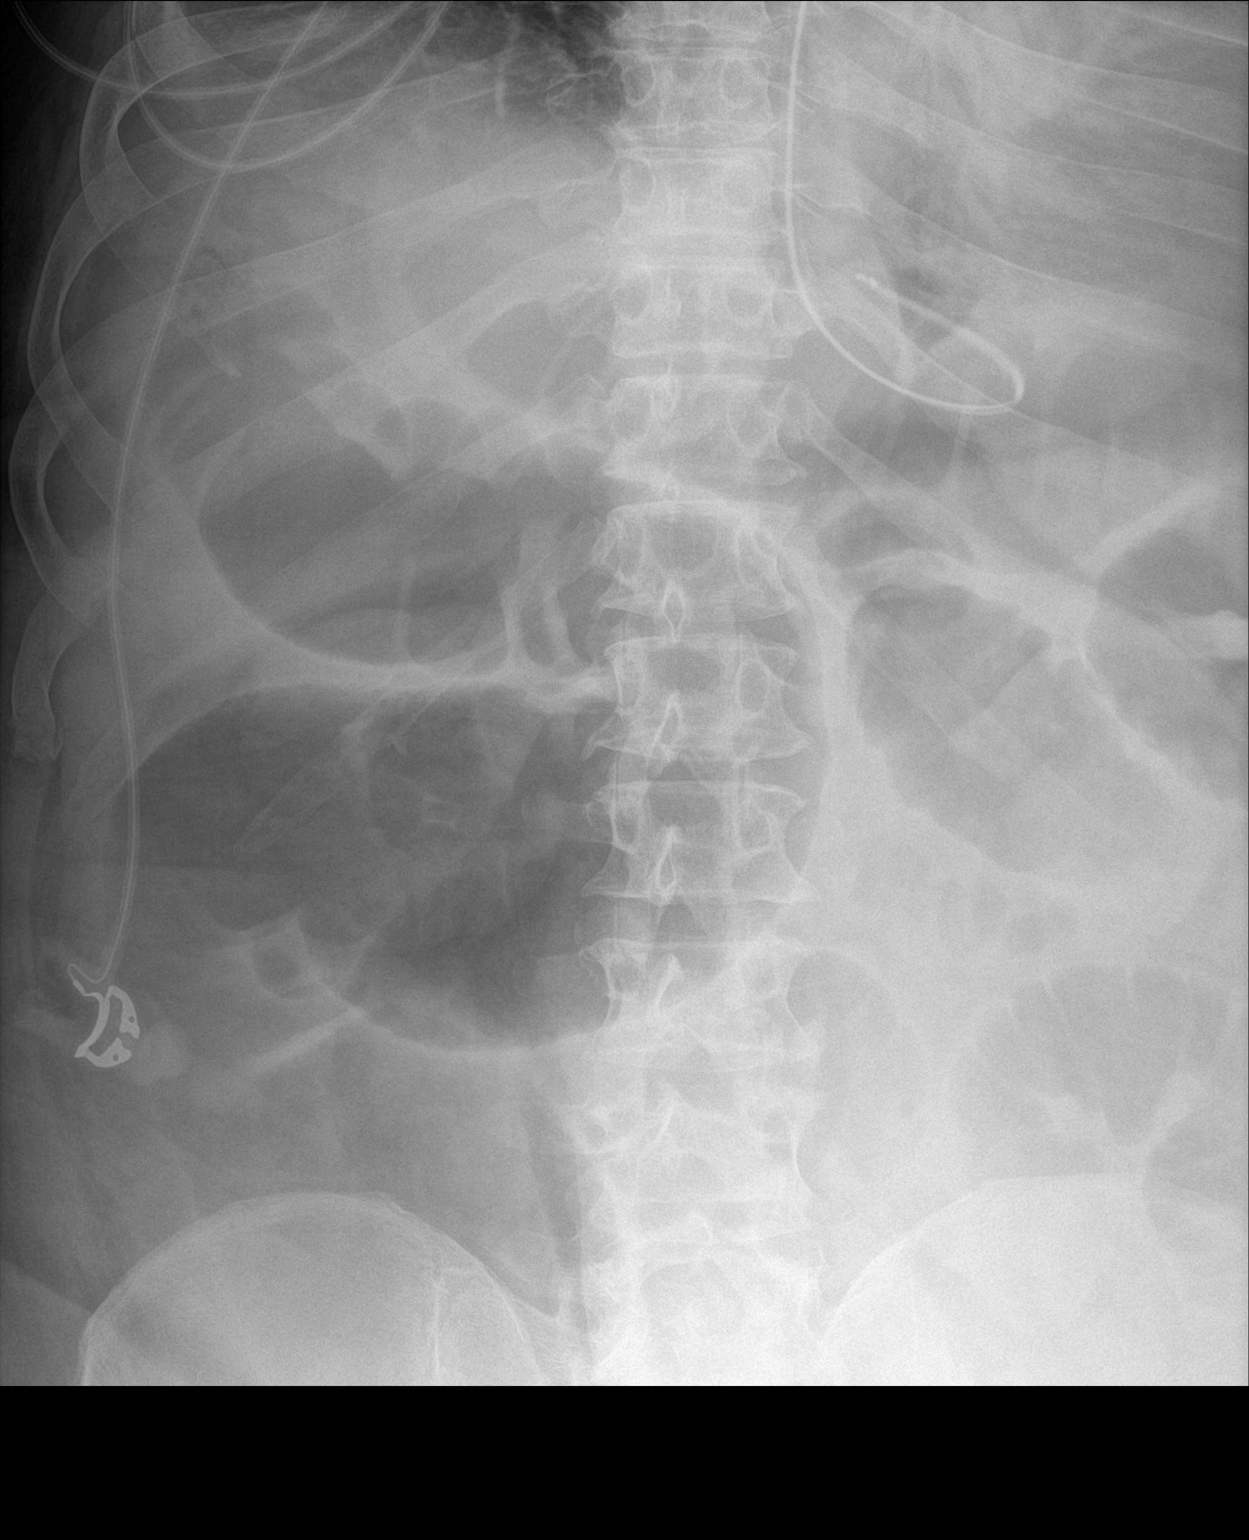

[2 of 2 positions shown; findings below may reference images not displayed]

FINDINGS: Nasogastric tube coiled in distal esophagus.

Air-filled distended loops of large and small bowel in the upper
abdomen.

Opacified LEFT lung base.

Bones demineralized.
IMPRESSION: Nasogastric tube remains coiled at the distal esophagus.

## 2020-09-01 IMAGING — DX PORTABLE ABDOMEN - 1 VIEW
1 series · 1 of 1 positions shown · non-contrast
Comparison: None.

CLINICAL DATA: Check gastric catheter placement

EXAM:
PORTABLE ABDOMEN - 1 VIEW

[abdomen]
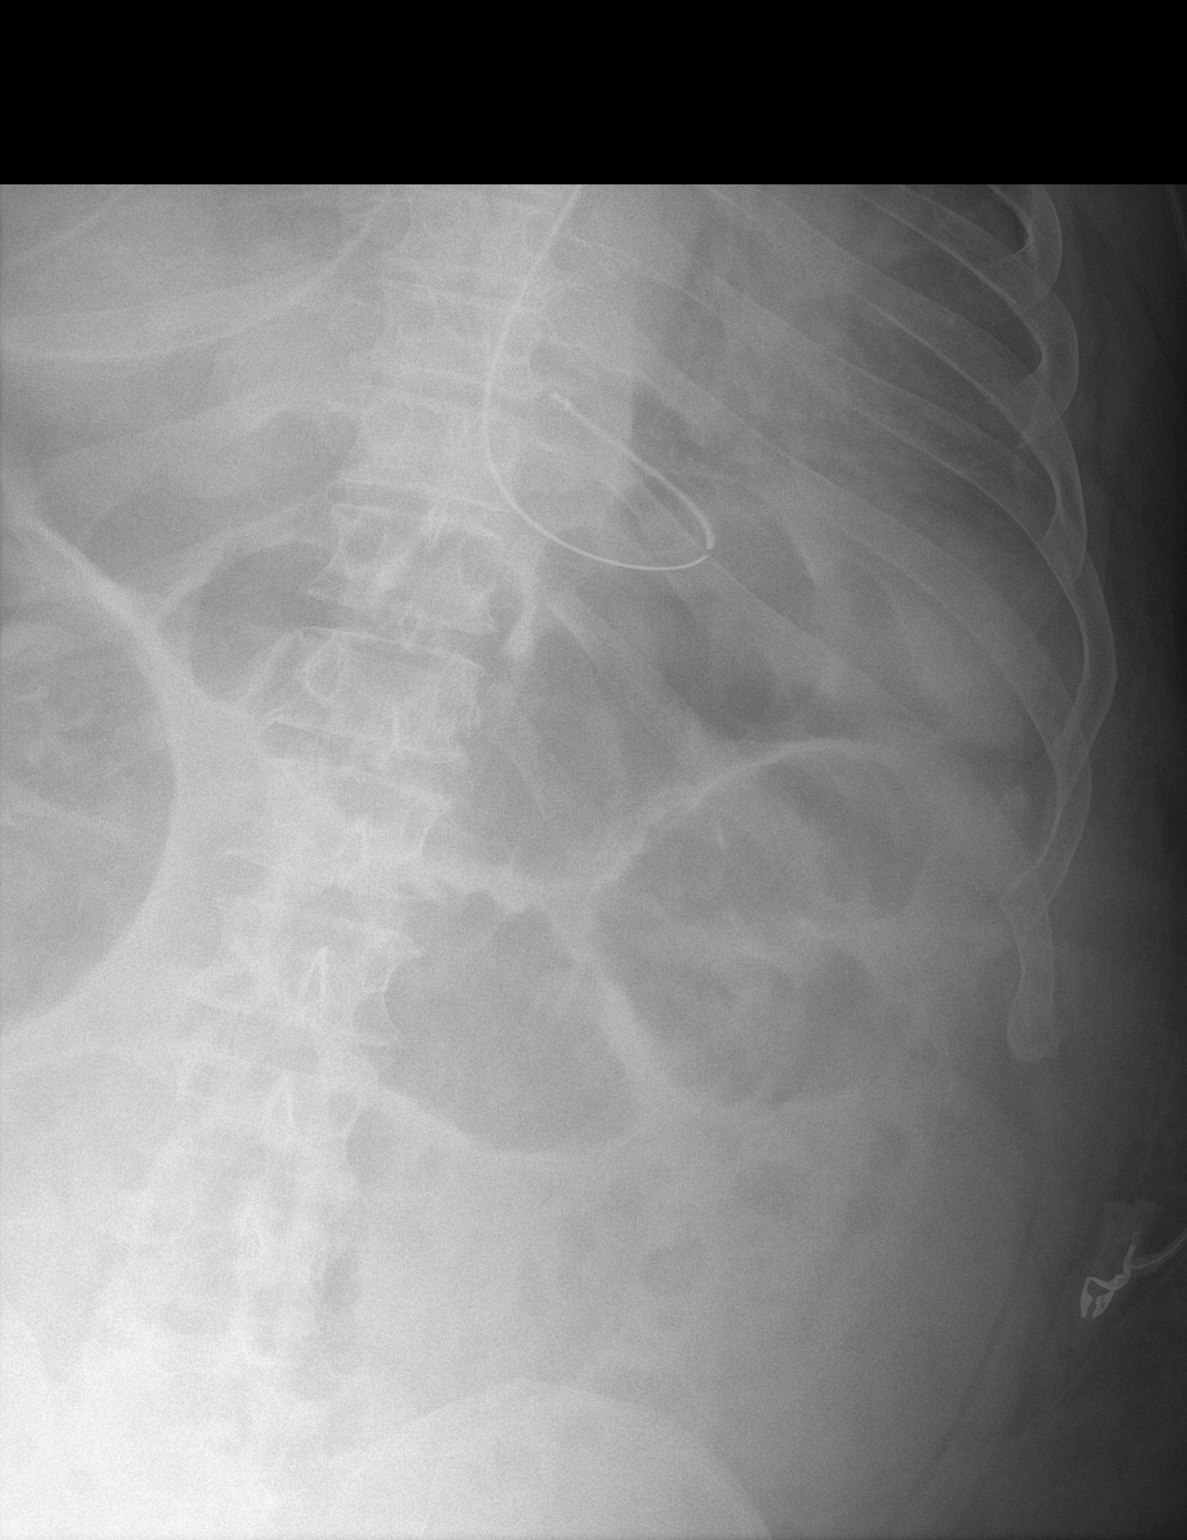

[1 of 1 positions shown; findings below may reference images not displayed]

FINDINGS: Scattered large and small bowel gas is noted. Gastric catheter is
noted coiled upon itself in the distal esophagus. It does not appear
to enter the stomach. This should be withdrawn slightly and
readvanced.
IMPRESSION: Gastric catheter appears coiled upon itself within the distal
esophagus.

## 2020-09-08 IMAGING — DX ABDOMEN - 1 VIEW
1 series · 2 of 2 positions shown · non-contrast
Comparison: 10/24/2018; 10/22/2018; 10/19/2018; CT abdomen
pelvis-10/19/2018

CLINICAL DATA: Ileus

EXAM:
ABDOMEN - 1 VIEW

[Series 1: abdomen · 0.14mm/px · 2 of 2 slices shown]
[im 1/2]
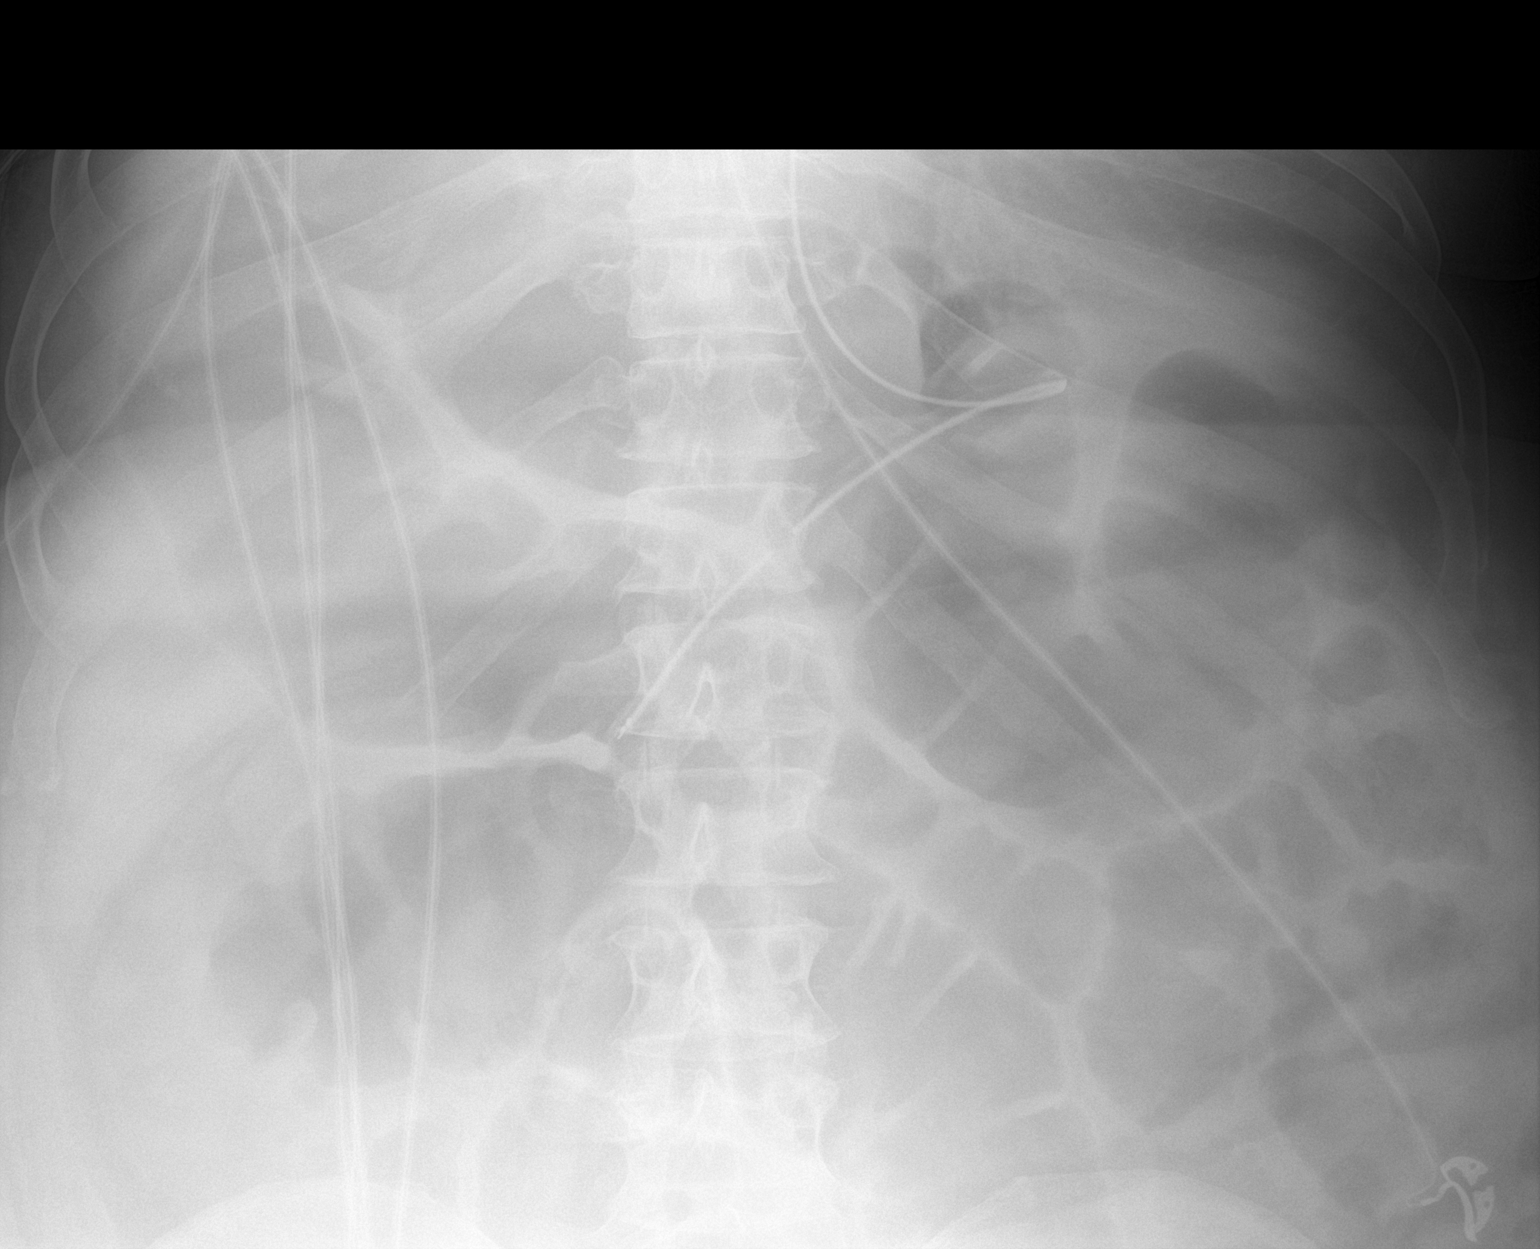
[im 2/2]
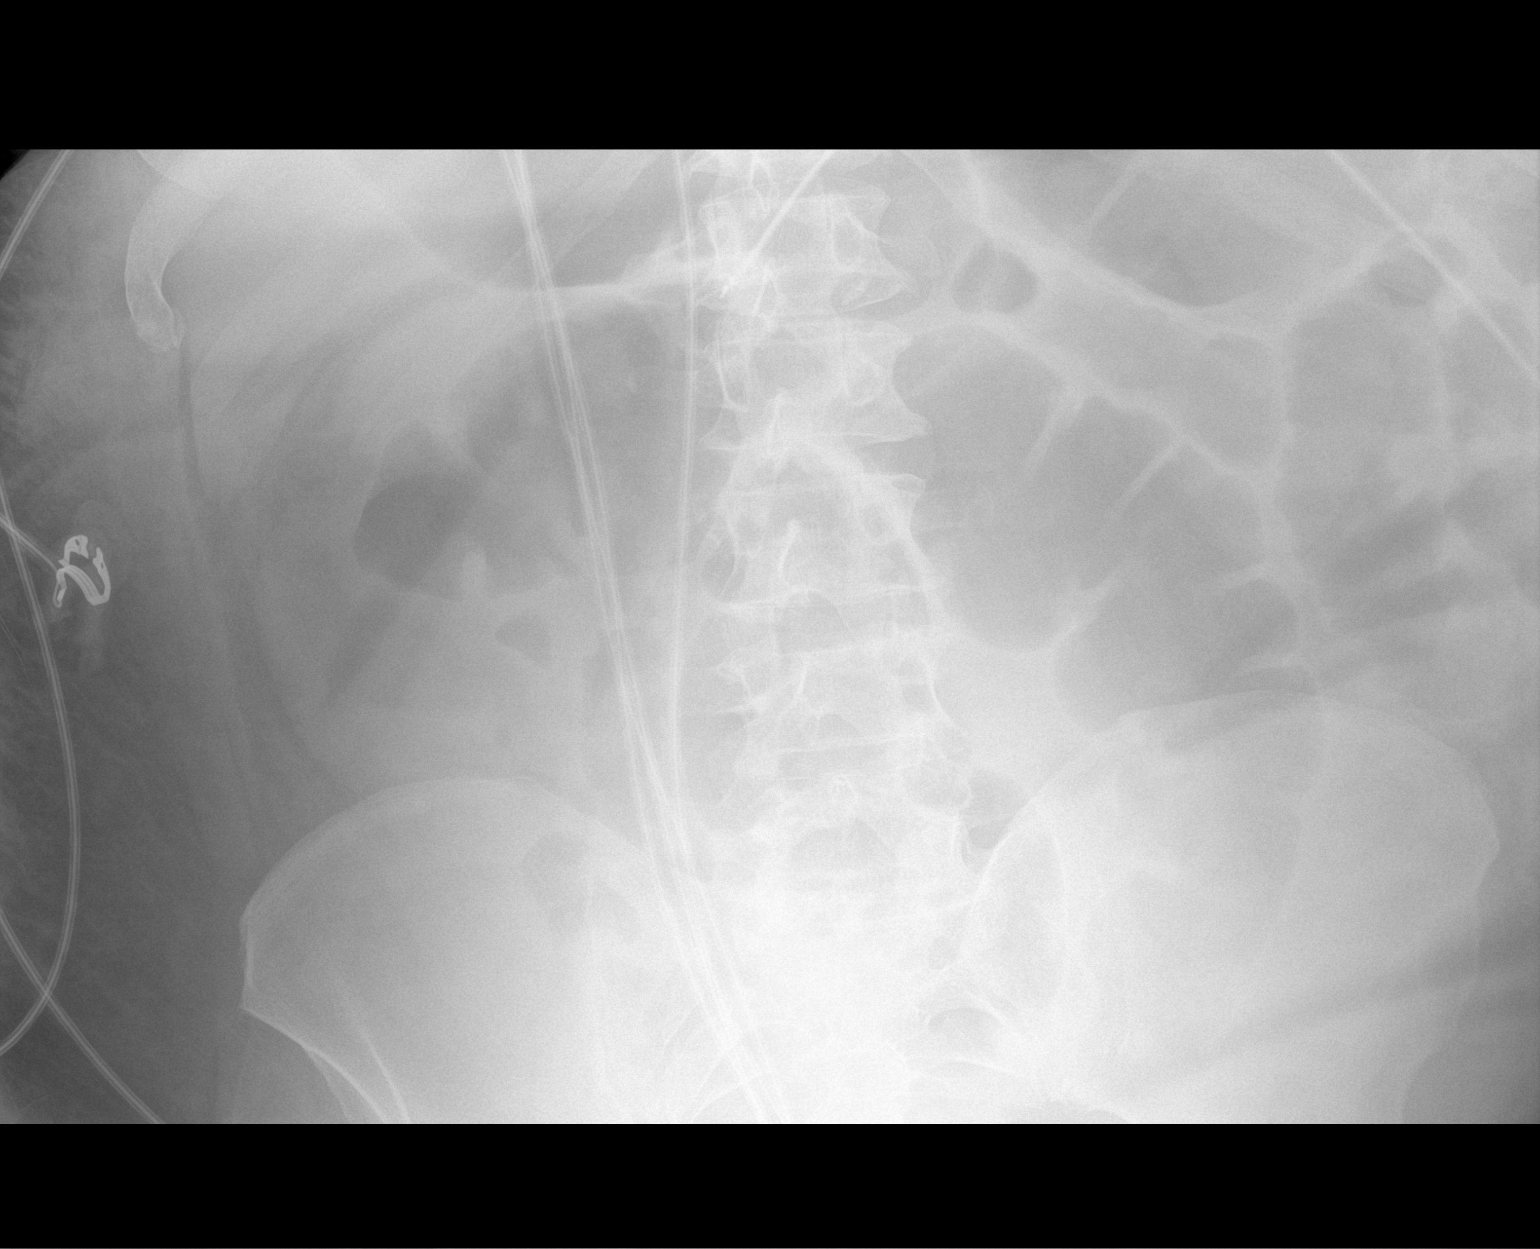

[2 of 2 positions shown; findings below may reference images not displayed]

FINDINGS: Enteric tube tip and side port overlies the expected location of the
gastric antrum.

Grossly unchanged marked gaseous distention of the colon with index
loop of colon within the left mid hemiabdomen measuring
approximately 7.8 cm in diameter. No change to slight improvement of
gaseous distention of several loops of small bowel within the left
mid hemiabdomen with index loop measuring approximately 4.6 cm with
potential bowel wall thickening.

No pneumoperitoneum, pneumatosis or portal venous gas.

No acute osseous abnormalities.
IMPRESSION: Grossly unchanged findings most suggestive of ileus.
